# Patient Record
Sex: Male | Born: 1945
Health system: Southern US, Community
[De-identification: ages and names within clinical notes are randomized; demographics above are authoritative.]

## PROBLEM LIST (undated history)

## (undated) DIAGNOSIS — F329 Major depressive disorder, single episode, unspecified: Secondary | ICD-10-CM

## (undated) DIAGNOSIS — J189 Pneumonia, unspecified organism: Secondary | ICD-10-CM

## (undated) DIAGNOSIS — C159 Malignant neoplasm of esophagus, unspecified: Secondary | ICD-10-CM

## (undated) DIAGNOSIS — D126 Benign neoplasm of colon, unspecified: Secondary | ICD-10-CM

## (undated) DIAGNOSIS — N400 Enlarged prostate without lower urinary tract symptoms: Secondary | ICD-10-CM

## (undated) DIAGNOSIS — C169 Malignant neoplasm of stomach, unspecified: Secondary | ICD-10-CM

## (undated) DIAGNOSIS — Z9289 Personal history of other medical treatment: Secondary | ICD-10-CM

## (undated) DIAGNOSIS — I10 Essential (primary) hypertension: Secondary | ICD-10-CM

## (undated) DIAGNOSIS — K5792 Diverticulitis of intestine, part unspecified, without perforation or abscess without bleeding: Secondary | ICD-10-CM

## (undated) DIAGNOSIS — K2270 Barrett's esophagus without dysplasia: Secondary | ICD-10-CM

## (undated) DIAGNOSIS — J984 Other disorders of lung: Secondary | ICD-10-CM

## (undated) DIAGNOSIS — K219 Gastro-esophageal reflux disease without esophagitis: Secondary | ICD-10-CM

## (undated) DIAGNOSIS — E7800 Pure hypercholesterolemia, unspecified: Secondary | ICD-10-CM

## (undated) DIAGNOSIS — M1990 Unspecified osteoarthritis, unspecified site: Secondary | ICD-10-CM

## (undated) DIAGNOSIS — E119 Type 2 diabetes mellitus without complications: Secondary | ICD-10-CM

## (undated) DIAGNOSIS — F419 Anxiety disorder, unspecified: Secondary | ICD-10-CM

## (undated) DIAGNOSIS — F17200 Nicotine dependence, unspecified, uncomplicated: Secondary | ICD-10-CM

## (undated) DIAGNOSIS — F32A Depression, unspecified: Secondary | ICD-10-CM

## (undated) DIAGNOSIS — K449 Diaphragmatic hernia without obstruction or gangrene: Secondary | ICD-10-CM

## (undated) DIAGNOSIS — K922 Gastrointestinal hemorrhage, unspecified: Secondary | ICD-10-CM

## (undated) DIAGNOSIS — J449 Chronic obstructive pulmonary disease, unspecified: Secondary | ICD-10-CM

## (undated) DIAGNOSIS — D649 Anemia, unspecified: Secondary | ICD-10-CM

## (undated) DIAGNOSIS — N529 Male erectile dysfunction, unspecified: Secondary | ICD-10-CM

## (undated) DIAGNOSIS — J438 Other emphysema: Secondary | ICD-10-CM

## (undated) HISTORY — DX: Benign prostatic hyperplasia without lower urinary tract symptoms: N40.0

## (undated) HISTORY — PX: COLONOSCOPY: SHX174

## (undated) HISTORY — PX: VASECTOMY: SHX75

## (undated) HISTORY — DX: Nicotine dependence, unspecified, uncomplicated: F17.200

## (undated) HISTORY — PX: UPPER GASTROINTESTINAL ENDOSCOPY: SHX188

## (undated) HISTORY — DX: Pure hypercholesterolemia, unspecified: E78.00

## (undated) HISTORY — DX: Diverticulitis of intestine, part unspecified, without perforation or abscess without bleeding: K57.92

## (undated) HISTORY — DX: Chronic obstructive pulmonary disease, unspecified: J44.9

## (undated) HISTORY — PX: POLYPECTOMY: SHX149

## (undated) HISTORY — DX: Male erectile dysfunction, unspecified: N52.9

## (undated) HISTORY — PX: CIRCUMCISION: SUR203

## (undated) HISTORY — DX: Gastro-esophageal reflux disease without esophagitis: K21.9

## (undated) HISTORY — DX: Benign neoplasm of colon, unspecified: D12.6

## (undated) HISTORY — DX: Other disorders of lung: J98.4

## (undated) HISTORY — DX: Barrett's esophagus without dysplasia: K22.70

## (undated) HISTORY — DX: Essential (primary) hypertension: I10

## (undated) HISTORY — DX: Unspecified osteoarthritis, unspecified site: M19.90

## (undated) HISTORY — DX: Other emphysema: J43.8

## (undated) HISTORY — DX: Diaphragmatic hernia without obstruction or gangrene: K44.9

---

## 1978-08-19 HISTORY — PX: OTHER SURGICAL HISTORY: SHX169

## 1978-08-19 HISTORY — PX: FOOT FRACTURE SURGERY: SHX645

## 1999-12-05 ENCOUNTER — Encounter: Admission: RE | Admit: 1999-12-05 | Discharge: 2000-03-04 | Payer: Self-pay | Admitting: Endocrinology

## 2000-10-24 ENCOUNTER — Encounter (INDEPENDENT_AMBULATORY_CARE_PROVIDER_SITE_OTHER): Payer: Self-pay

## 2000-10-24 ENCOUNTER — Other Ambulatory Visit: Admission: RE | Admit: 2000-10-24 | Discharge: 2000-10-24 | Payer: Self-pay | Admitting: Gastroenterology

## 2001-01-19 ENCOUNTER — Encounter (INDEPENDENT_AMBULATORY_CARE_PROVIDER_SITE_OTHER): Payer: Self-pay

## 2001-01-19 ENCOUNTER — Other Ambulatory Visit: Admission: RE | Admit: 2001-01-19 | Discharge: 2001-01-19 | Payer: Self-pay | Admitting: Gastroenterology

## 2001-07-15 ENCOUNTER — Encounter: Admission: RE | Admit: 2001-07-15 | Discharge: 2001-10-13 | Payer: Self-pay | Admitting: Endocrinology

## 2004-06-22 ENCOUNTER — Ambulatory Visit: Payer: Self-pay | Admitting: Endocrinology

## 2004-06-27 ENCOUNTER — Ambulatory Visit: Payer: Self-pay | Admitting: Endocrinology

## 2004-07-02 ENCOUNTER — Ambulatory Visit: Payer: Self-pay | Admitting: Gastroenterology

## 2004-07-16 ENCOUNTER — Ambulatory Visit: Payer: Self-pay | Admitting: Gastroenterology

## 2004-12-17 ENCOUNTER — Ambulatory Visit: Payer: Self-pay | Admitting: Endocrinology

## 2004-12-25 ENCOUNTER — Ambulatory Visit: Payer: Self-pay | Admitting: Endocrinology

## 2005-03-29 ENCOUNTER — Ambulatory Visit: Payer: Self-pay | Admitting: Endocrinology

## 2005-04-08 ENCOUNTER — Ambulatory Visit: Payer: Self-pay | Admitting: Endocrinology

## 2005-05-10 ENCOUNTER — Ambulatory Visit: Payer: Self-pay | Admitting: Endocrinology

## 2005-06-21 ENCOUNTER — Ambulatory Visit: Payer: Self-pay | Admitting: Endocrinology

## 2005-07-05 ENCOUNTER — Ambulatory Visit: Payer: Self-pay | Admitting: Endocrinology

## 2005-08-05 ENCOUNTER — Ambulatory Visit: Payer: Self-pay | Admitting: Gastroenterology

## 2005-08-19 DIAGNOSIS — K2270 Barrett's esophagus without dysplasia: Secondary | ICD-10-CM

## 2005-08-19 HISTORY — DX: Barrett's esophagus without dysplasia: K22.70

## 2005-11-04 ENCOUNTER — Ambulatory Visit: Payer: Self-pay | Admitting: Gastroenterology

## 2005-11-19 ENCOUNTER — Encounter (INDEPENDENT_AMBULATORY_CARE_PROVIDER_SITE_OTHER): Payer: Self-pay | Admitting: Specialist

## 2005-11-19 ENCOUNTER — Ambulatory Visit: Payer: Self-pay | Admitting: Gastroenterology

## 2005-11-19 HISTORY — PX: OTHER SURGICAL HISTORY: SHX169

## 2005-11-19 LAB — HM COLONOSCOPY

## 2005-12-05 ENCOUNTER — Ambulatory Visit (HOSPITAL_COMMUNITY): Admission: RE | Admit: 2005-12-05 | Discharge: 2005-12-05 | Payer: Self-pay | Admitting: Gastroenterology

## 2005-12-25 ENCOUNTER — Ambulatory Visit: Payer: Self-pay | Admitting: Endocrinology

## 2006-01-01 ENCOUNTER — Ambulatory Visit: Payer: Self-pay | Admitting: Endocrinology

## 2006-03-26 ENCOUNTER — Ambulatory Visit: Payer: Self-pay | Admitting: Endocrinology

## 2006-04-03 ENCOUNTER — Ambulatory Visit: Payer: Self-pay | Admitting: Endocrinology

## 2006-06-30 ENCOUNTER — Ambulatory Visit: Payer: Self-pay | Admitting: Endocrinology

## 2006-06-30 LAB — CONVERTED CEMR LAB
ALT: 17 units/L (ref 0–40)
AST: 20 units/L (ref 0–37)
Albumin: 3.8 g/dL (ref 3.5–5.2)
BUN: 21 mg/dL (ref 6–23)
Basophils Absolute: 0.1 10*3/uL (ref 0.0–0.1)
Basophils Relative: 0.8 % (ref 0.0–1.0)
Bilirubin Urine: NEGATIVE
CO2: 26 meq/L (ref 19–32)
Cholesterol: 79 mg/dL (ref 0–200)
Creatinine, Ser: 1.2 mg/dL (ref 0.4–1.5)
Creatinine,U: 108.2 mg/dL
Eosinophil percent: 3.9 % (ref 0.0–5.0)
Glomerular Filtration Rate, Af Am: 80 mL/min/{1.73_m2}
HCT: 40.6 % (ref 39.0–52.0)
Hemoglobin: 13.4 g/dL (ref 13.0–17.0)
Leukocytes, UA: NEGATIVE
Lymphocytes Relative: 32.4 % (ref 12.0–46.0)
Monocytes Absolute: 0.4 10*3/uL (ref 0.2–0.7)
Neutrophils Relative %: 56.9 % (ref 43.0–77.0)
Platelets: 186 10*3/uL (ref 150–400)
Potassium: 4.2 meq/L (ref 3.5–5.1)
RBC: 4.22 M/uL (ref 4.22–5.81)
Total Bilirubin: 0.6 mg/dL (ref 0.3–1.2)
Total Protein: 6.8 g/dL (ref 6.0–8.3)
Urobilinogen, UA: 0.2 (ref 0.0–1.0)
WBC: 6.6 10*3/uL (ref 4.5–10.5)
pH: 6.5 (ref 5.0–8.0)

## 2006-07-22 ENCOUNTER — Ambulatory Visit: Payer: Self-pay | Admitting: Endocrinology

## 2006-08-25 ENCOUNTER — Ambulatory Visit: Payer: Self-pay

## 2006-10-14 ENCOUNTER — Ambulatory Visit: Payer: Self-pay | Admitting: Pulmonary Disease

## 2006-10-14 ENCOUNTER — Ambulatory Visit: Payer: Self-pay | Admitting: Internal Medicine

## 2006-10-14 ENCOUNTER — Inpatient Hospital Stay (HOSPITAL_COMMUNITY): Admission: EM | Admit: 2006-10-14 | Discharge: 2006-10-18 | Payer: Self-pay | Admitting: Emergency Medicine

## 2006-10-15 ENCOUNTER — Encounter: Payer: Self-pay | Admitting: Cardiology

## 2006-10-20 ENCOUNTER — Ambulatory Visit: Payer: Self-pay | Admitting: Endocrinology

## 2006-10-21 ENCOUNTER — Ambulatory Visit: Payer: Self-pay | Admitting: Endocrinology

## 2006-10-27 ENCOUNTER — Ambulatory Visit: Payer: Self-pay | Admitting: Endocrinology

## 2006-11-13 ENCOUNTER — Ambulatory Visit: Payer: Self-pay | Admitting: Cardiology

## 2006-12-08 ENCOUNTER — Ambulatory Visit: Payer: Self-pay | Admitting: Cardiology

## 2006-12-08 LAB — CONVERTED CEMR LAB
Basophils Relative: 0.3 % (ref 0.0–1.0)
CO2: 28 meq/L (ref 19–32)
Chloride: 106 meq/L (ref 96–112)
Glucose, Bld: 166 mg/dL — ABNORMAL HIGH (ref 70–99)
INR: 0.8 — ABNORMAL LOW (ref 0.9–2.0)
MCHC: 34.3 g/dL (ref 30.0–36.0)
Monocytes Relative: 5.8 % (ref 3.0–11.0)
Neutrophils Relative %: 73.5 % (ref 43.0–77.0)
RBC: 3.8 M/uL — ABNORMAL LOW (ref 4.22–5.81)
RDW: 13 % (ref 11.5–14.6)
Sodium: 141 meq/L (ref 135–145)
WBC: 10.9 10*3/uL — ABNORMAL HIGH (ref 4.5–10.5)

## 2007-01-06 ENCOUNTER — Ambulatory Visit: Payer: Self-pay | Admitting: Cardiology

## 2007-01-06 LAB — CONVERTED CEMR LAB
ALT: 18 units/L (ref 0–40)
AST: 23 units/L (ref 0–37)
Bilirubin, Direct: 0.1 mg/dL (ref 0.0–0.3)
HDL: 31.5 mg/dL — ABNORMAL LOW (ref >39.0)
Total Bilirubin: 0.6 mg/dL (ref 0.3–1.2)
Total CHOL/HDL Ratio: 2.5
Triglycerides: 145 mg/dL (ref 0–149)
VLDL: 29 mg/dL (ref 0–40)

## 2007-02-27 ENCOUNTER — Ambulatory Visit: Payer: Self-pay | Admitting: Endocrinology

## 2007-02-27 LAB — CONVERTED CEMR LAB: Hgb A1c MFr Bld: 7.4 % — ABNORMAL HIGH (ref 4.6–6.0)

## 2007-03-05 ENCOUNTER — Ambulatory Visit: Payer: Self-pay | Admitting: Endocrinology

## 2007-03-13 ENCOUNTER — Encounter: Payer: Self-pay | Admitting: Endocrinology

## 2007-03-13 DIAGNOSIS — F418 Other specified anxiety disorders: Secondary | ICD-10-CM

## 2007-03-13 DIAGNOSIS — I10 Essential (primary) hypertension: Secondary | ICD-10-CM

## 2007-03-13 DIAGNOSIS — K219 Gastro-esophageal reflux disease without esophagitis: Secondary | ICD-10-CM | POA: Insufficient documentation

## 2007-03-13 DIAGNOSIS — E119 Type 2 diabetes mellitus without complications: Secondary | ICD-10-CM

## 2007-03-13 HISTORY — DX: Gastro-esophageal reflux disease without esophagitis: K21.9

## 2007-03-13 HISTORY — DX: Type 2 diabetes mellitus without complications: E11.9

## 2007-03-13 HISTORY — DX: Essential (primary) hypertension: I10

## 2007-08-03 ENCOUNTER — Ambulatory Visit: Payer: Self-pay | Admitting: Endocrinology

## 2007-08-05 LAB — CONVERTED CEMR LAB
AST: 29 units/L (ref 0–37)
Cholesterol: 53 mg/dL (ref 0–200)
Creatinine, Ser: 1.1 mg/dL (ref 0.4–1.5)
Eosinophils Absolute: 0.2 10*3/uL (ref 0.0–0.6)
Eosinophils Relative: 3 % (ref 0.0–5.0)
GFR calc Af Amer: 88 mL/min
GFR calc non Af Amer: 73 mL/min
Glucose, Bld: 139 mg/dL — ABNORMAL HIGH (ref 70–99)
Hemoglobin, Urine: NEGATIVE
Hemoglobin: 13.6 g/dL (ref 13.0–17.0)
Hgb A1c MFr Bld: 7 % — ABNORMAL HIGH (ref 4.6–6.0)
Ketones, ur: NEGATIVE mg/dL
LDL Cholesterol: 21 mg/dL (ref 0–99)
Lymphocytes Relative: 26.8 % (ref 12.0–46.0)
MCV: 95.3 fL (ref 78.0–100.0)
Neutrophils Relative %: 64 % (ref 43.0–77.0)
Nitrite: NEGATIVE
Platelets: 153 10*3/uL (ref 150–400)
Potassium: 4.7 meq/L (ref 3.5–5.1)
Specific Gravity, Urine: 1.015 (ref 1.000–1.03)
TSH: 1.48 microintl units/mL (ref 0.35–5.50)
Total Protein: 6.4 g/dL (ref 6.0–8.3)
Triglycerides: 84 mg/dL (ref 0–149)
Urine Glucose: NEGATIVE mg/dL
Urobilinogen, UA: 0.2 (ref 0.0–1.0)
VLDL: 17 mg/dL (ref 0–40)

## 2007-08-07 ENCOUNTER — Ambulatory Visit: Payer: Self-pay | Admitting: Endocrinology

## 2007-09-28 ENCOUNTER — Telehealth (INDEPENDENT_AMBULATORY_CARE_PROVIDER_SITE_OTHER): Payer: Self-pay | Admitting: *Deleted

## 2008-01-27 ENCOUNTER — Ambulatory Visit: Payer: Self-pay | Admitting: Endocrinology

## 2008-01-27 LAB — CONVERTED CEMR LAB
Cholesterol: 92 mg/dL (ref 0–200)
HDL: 26.7 mg/dL — ABNORMAL LOW (ref >39.0)

## 2008-02-03 ENCOUNTER — Ambulatory Visit: Payer: Self-pay | Admitting: Endocrinology

## 2008-02-03 DIAGNOSIS — E7800 Pure hypercholesterolemia, unspecified: Secondary | ICD-10-CM

## 2008-02-03 HISTORY — DX: Pure hypercholesterolemia, unspecified: E78.00

## 2008-03-17 ENCOUNTER — Telehealth: Payer: Self-pay | Admitting: Endocrinology

## 2008-03-21 ENCOUNTER — Telehealth (INDEPENDENT_AMBULATORY_CARE_PROVIDER_SITE_OTHER): Payer: Self-pay | Admitting: *Deleted

## 2008-04-28 ENCOUNTER — Telehealth (INDEPENDENT_AMBULATORY_CARE_PROVIDER_SITE_OTHER): Payer: Self-pay | Admitting: *Deleted

## 2008-05-19 ENCOUNTER — Ambulatory Visit: Payer: Self-pay | Admitting: Endocrinology

## 2008-05-19 DIAGNOSIS — G622 Polyneuropathy due to other toxic agents: Secondary | ICD-10-CM

## 2008-05-19 DIAGNOSIS — G619 Inflammatory polyneuropathy, unspecified: Secondary | ICD-10-CM | POA: Insufficient documentation

## 2008-05-19 DIAGNOSIS — R071 Chest pain on breathing: Secondary | ICD-10-CM

## 2008-05-19 LAB — CONVERTED CEMR LAB: Hgb A1c MFr Bld: 6.8 % — ABNORMAL HIGH (ref 4.6–6.0)

## 2008-07-25 ENCOUNTER — Ambulatory Visit: Payer: Self-pay | Admitting: Endocrinology

## 2008-07-27 LAB — CONVERTED CEMR LAB
ALT: 21 units/L (ref 0–53)
Albumin: 3.9 g/dL (ref 3.5–5.2)
Alkaline Phosphatase: 61 units/L (ref 39–117)
BUN: 20 mg/dL (ref 6–23)
Bilirubin Urine: NEGATIVE
Calcium: 9.2 mg/dL (ref 8.4–10.5)
Eosinophils Absolute: 0.2 10*3/uL (ref 0.0–0.7)
Eosinophils Relative: 3.4 % (ref 0.0–5.0)
GFR calc Af Amer: 88 mL/min
GFR calc non Af Amer: 73 mL/min
Glucose, Bld: 201 mg/dL — ABNORMAL HIGH (ref 70–99)
HCT: 39.9 % (ref 39.0–52.0)
Hemoglobin, Urine: NEGATIVE
Hemoglobin: 13.5 g/dL (ref 13.0–17.0)
LDL Cholesterol: 50 mg/dL (ref 0–99)
Lymphocytes Relative: 29 % (ref 12.0–46.0)
MCHC: 33.9 g/dL (ref 30.0–36.0)
MCV: 97.8 fL (ref 78.0–100.0)
Monocytes Absolute: 0.4 10*3/uL (ref 0.1–1.0)
Monocytes Relative: 6.1 % (ref 3.0–12.0)
Neutrophils Relative %: 60.4 % (ref 43.0–77.0)
PSA: 0.65 ng/mL (ref 0.10–4.00)
Potassium: 4.7 meq/L (ref 3.5–5.1)
RDW: 11.7 % (ref 11.5–14.6)
Sodium: 137 meq/L (ref 135–145)
TSH: 2.07 microintl units/mL (ref 0.35–5.50)
Total Protein, Urine: NEGATIVE mg/dL
Total Protein: 6.6 g/dL (ref 6.0–8.3)
Urobilinogen, UA: 0.2 (ref 0.0–1.0)
VLDL: 25 mg/dL (ref 0–40)
pH: 6.5 (ref 5.0–8.0)

## 2008-08-01 ENCOUNTER — Ambulatory Visit: Payer: Self-pay | Admitting: Endocrinology

## 2008-08-01 DIAGNOSIS — D126 Benign neoplasm of colon, unspecified: Secondary | ICD-10-CM | POA: Insufficient documentation

## 2008-09-28 ENCOUNTER — Encounter: Payer: Self-pay | Admitting: Endocrinology

## 2008-10-24 ENCOUNTER — Ambulatory Visit: Payer: Self-pay | Admitting: Endocrinology

## 2008-10-31 ENCOUNTER — Inpatient Hospital Stay (HOSPITAL_COMMUNITY): Admission: EM | Admit: 2008-10-31 | Discharge: 2008-11-07 | Payer: Self-pay | Admitting: Emergency Medicine

## 2008-10-31 ENCOUNTER — Ambulatory Visit: Payer: Self-pay | Admitting: Internal Medicine

## 2008-10-31 ENCOUNTER — Encounter: Payer: Self-pay | Admitting: Endocrinology

## 2008-11-05 ENCOUNTER — Encounter: Payer: Self-pay | Admitting: Pulmonary Disease

## 2008-11-08 ENCOUNTER — Ambulatory Visit: Payer: Self-pay | Admitting: Endocrinology

## 2008-11-08 DIAGNOSIS — J438 Other emphysema: Secondary | ICD-10-CM

## 2008-11-08 DIAGNOSIS — J189 Pneumonia, unspecified organism: Secondary | ICD-10-CM

## 2008-11-08 HISTORY — DX: Other emphysema: J43.8

## 2008-11-24 ENCOUNTER — Ambulatory Visit: Payer: Self-pay | Admitting: Pulmonary Disease

## 2008-11-24 DIAGNOSIS — J984 Other disorders of lung: Secondary | ICD-10-CM

## 2008-11-24 DIAGNOSIS — F17200 Nicotine dependence, unspecified, uncomplicated: Secondary | ICD-10-CM

## 2008-11-24 HISTORY — DX: Nicotine dependence, unspecified, uncomplicated: F17.200

## 2008-11-24 HISTORY — DX: Other disorders of lung: J98.4

## 2008-12-02 ENCOUNTER — Encounter: Payer: Self-pay | Admitting: Pulmonary Disease

## 2008-12-15 ENCOUNTER — Ambulatory Visit: Payer: Self-pay | Admitting: Endocrinology

## 2008-12-15 DIAGNOSIS — F411 Generalized anxiety disorder: Secondary | ICD-10-CM

## 2009-03-21 ENCOUNTER — Ambulatory Visit: Payer: Self-pay | Admitting: Endocrinology

## 2009-03-23 ENCOUNTER — Ambulatory Visit: Payer: Self-pay | Admitting: Endocrinology

## 2009-03-30 ENCOUNTER — Telehealth (INDEPENDENT_AMBULATORY_CARE_PROVIDER_SITE_OTHER): Payer: Self-pay | Admitting: *Deleted

## 2009-04-05 ENCOUNTER — Telehealth: Payer: Self-pay | Admitting: Endocrinology

## 2009-04-07 ENCOUNTER — Ambulatory Visit: Payer: Self-pay | Admitting: Cardiology

## 2009-04-07 ENCOUNTER — Ambulatory Visit: Payer: Self-pay | Admitting: Pulmonary Disease

## 2009-07-28 ENCOUNTER — Ambulatory Visit: Payer: Self-pay | Admitting: Endocrinology

## 2009-07-28 LAB — CONVERTED CEMR LAB
ALT: 21 units/L (ref 0–53)
Basophils Absolute: 0.1 10*3/uL (ref 0.0–0.1)
Basophils Relative: 1.1 % (ref 0.0–3.0)
Bilirubin Urine: NEGATIVE
Bilirubin, Direct: 0.1 mg/dL (ref 0.0–0.3)
Cholesterol: 119 mg/dL (ref 0–200)
Creatinine,U: 106 mg/dL
Eosinophils Absolute: 0.2 10*3/uL (ref 0.0–0.7)
Eosinophils Relative: 3.5 % (ref 0.0–5.0)
GFR calc non Af Amer: 65.36 mL/min (ref >60)
Glucose, Bld: 170 mg/dL — ABNORMAL HIGH (ref 70–99)
HCT: 35.9 % — ABNORMAL LOW (ref 39.0–52.0)
Hemoglobin, Urine: NEGATIVE
Ketones, ur: NEGATIVE mg/dL
Lymphocytes Relative: 28.1 % (ref 12.0–46.0)
Lymphs Abs: 2 10*3/uL (ref 0.7–4.0)
MCHC: 34.2 g/dL (ref 30.0–36.0)
Microalb, Ur: 6.1 mg/dL — ABNORMAL HIGH (ref 0.0–1.9)
Monocytes Relative: 6.5 % (ref 3.0–12.0)
Neutro Abs: 4.3 10*3/uL (ref 1.4–7.7)
Neutrophils Relative %: 60.8 % (ref 43.0–77.0)
PSA: 0.59 ng/mL (ref 0.10–4.00)
Potassium: 4.4 meq/L (ref 3.5–5.1)
RBC: 3.69 M/uL — ABNORMAL LOW (ref 4.22–5.81)
Total Protein, Urine: NEGATIVE mg/dL
Triglycerides: 157 mg/dL — ABNORMAL HIGH (ref 0.0–149.0)
pH: 5.5 (ref 5.0–8.0)

## 2009-08-15 ENCOUNTER — Ambulatory Visit: Payer: Self-pay | Admitting: Endocrinology

## 2009-08-15 DIAGNOSIS — R809 Proteinuria, unspecified: Secondary | ICD-10-CM | POA: Insufficient documentation

## 2009-08-15 DIAGNOSIS — D649 Anemia, unspecified: Secondary | ICD-10-CM

## 2009-08-15 HISTORY — DX: Anemia, unspecified: D64.9

## 2009-08-16 LAB — CONVERTED CEMR LAB
HCT: 39.1 % (ref 39.0–52.0)
Hemoglobin: 13.2 g/dL (ref 13.0–17.0)
Iron: 59 ug/dL (ref 42–165)
MCHC: 33.6 g/dL (ref 30.0–36.0)
RDW: 11.8 % (ref 11.5–14.6)
Saturation Ratios: 17.1 % — ABNORMAL LOW (ref 20.0–50.0)
Transferrin: 246.1 mg/dL (ref 212.0–360.0)
Vitamin B-12: 1186 pg/mL — ABNORMAL HIGH (ref 211–911)

## 2009-10-19 ENCOUNTER — Telehealth: Payer: Self-pay | Admitting: Endocrinology

## 2009-11-16 ENCOUNTER — Ambulatory Visit: Payer: Self-pay | Admitting: Endocrinology

## 2009-11-16 LAB — CONVERTED CEMR LAB: Hgb A1c MFr Bld: 7.6 % — ABNORMAL HIGH (ref 4.6–6.5)

## 2010-02-20 ENCOUNTER — Ambulatory Visit: Payer: Self-pay | Admitting: Endocrinology

## 2010-03-13 ENCOUNTER — Telehealth: Payer: Self-pay | Admitting: Internal Medicine

## 2010-03-15 ENCOUNTER — Ambulatory Visit: Payer: Self-pay | Admitting: Endocrinology

## 2010-03-15 LAB — CONVERTED CEMR LAB
Cholesterol: 136 mg/dL (ref 0–200)
Direct LDL: 66.5 mg/dL
HDL: 30 mg/dL — ABNORMAL LOW (ref >39.00)
Hgb A1c MFr Bld: 8.9 % — ABNORMAL HIGH (ref 4.6–6.5)
Total CHOL/HDL Ratio: 5
Total Protein: 6.4 g/dL (ref 6.0–8.3)
Triglycerides: 255 mg/dL — ABNORMAL HIGH (ref 0.0–149.0)
VLDL: 51 mg/dL — ABNORMAL HIGH (ref 0.0–40.0)

## 2010-03-19 ENCOUNTER — Ambulatory Visit: Payer: Self-pay | Admitting: Endocrinology

## 2010-04-10 ENCOUNTER — Telehealth: Payer: Self-pay | Admitting: Endocrinology

## 2010-06-06 ENCOUNTER — Telehealth: Payer: Self-pay | Admitting: Endocrinology

## 2010-06-07 ENCOUNTER — Telehealth: Payer: Self-pay | Admitting: Endocrinology

## 2010-06-11 ENCOUNTER — Telehealth: Payer: Self-pay | Admitting: Endocrinology

## 2010-06-18 ENCOUNTER — Ambulatory Visit: Payer: Self-pay | Admitting: Pulmonary Disease

## 2010-06-18 ENCOUNTER — Ambulatory Visit: Payer: Self-pay | Admitting: Endocrinology

## 2010-07-03 ENCOUNTER — Telehealth: Payer: Self-pay | Admitting: Endocrinology

## 2010-07-16 ENCOUNTER — Ambulatory Visit: Payer: Self-pay | Admitting: Cardiology

## 2010-07-24 ENCOUNTER — Ambulatory Visit: Payer: Self-pay | Admitting: Endocrinology

## 2010-07-24 LAB — CONVERTED CEMR LAB
ALT: 28 units/L (ref 0–53)
Basophils Relative: 0.8 % (ref 0.0–3.0)
Bilirubin Urine: NEGATIVE
CO2: 30 meq/L (ref 19–32)
Calcium: 9.3 mg/dL (ref 8.4–10.5)
Cholesterol: 130 mg/dL (ref 0–200)
Creatinine, Ser: 1.1 mg/dL (ref 0.4–1.5)
Glucose, Bld: 239 mg/dL — ABNORMAL HIGH (ref 70–99)
HDL: 31.2 mg/dL — ABNORMAL LOW (ref >39.00)
Ketones, ur: NEGATIVE mg/dL
Lymphocytes Relative: 32.9 % (ref 12.0–46.0)
MCV: 94.9 fL (ref 78.0–100.0)
Neutrophils Relative %: 56.5 % (ref 43.0–77.0)
Nitrite: NEGATIVE
Potassium: 4.8 meq/L (ref 3.5–5.1)
TSH: 2.21 microintl units/mL (ref 0.35–5.50)
Total Bilirubin: 0.5 mg/dL (ref 0.3–1.2)
Total CHOL/HDL Ratio: 4
Triglycerides: 216 mg/dL — ABNORMAL HIGH (ref 0.0–149.0)
Urobilinogen, UA: 0.2 (ref 0.0–1.0)
VLDL: 43.2 mg/dL — ABNORMAL HIGH (ref 0.0–40.0)
WBC: 5.5 10*3/uL (ref 4.5–10.5)

## 2010-07-30 ENCOUNTER — Ambulatory Visit: Payer: Self-pay | Admitting: Endocrinology

## 2010-07-30 ENCOUNTER — Encounter: Payer: Self-pay | Admitting: Endocrinology

## 2010-07-30 LAB — CONVERTED CEMR LAB
Iron: 128 ug/dL (ref 42–165)
Vitamin B-12: 789 pg/mL (ref 211–911)

## 2010-09-18 NOTE — Miscellaneous (Signed)
Summary: [facility]   Imported By: Lester [place] 03/22/2010 08:43:21  _____________________________________________________________________  External Attachment:    Type:   Image     Comment:   External Document

## 2010-09-18 NOTE — Miscellaneous (Signed)
Summary: [facility]   Imported By: Lester [place] 03/22/2010 08:46:08  _____________________________________________________________________  External Attachment:    Type:   Image     Comment:   External Document

## 2010-09-18 NOTE — Progress Notes (Signed)
Summary: Elevated CBG  Phone Note Call from Patient   Caller: Spouse Summary of Call: Clerance Lav called stating pts CBG this am was 210 and increased his insulin to 80 units.  Should pt increase insulin or add another med? Initial call taken by: Lanier Prude, [facility]),  June 07, 2010 3:07 PM  Follow-up for Phone Call        please continue same ov is due Follow-up by: Minus Breeding MD,  June 07, 2010 3:10 PM  Additional Follow-up for Phone Call Additional follow up Details #1::        pt's wife informed. Additional Follow-up by: Lanier Prude, [facility]),  June 07, 2010 3:16 PM

## 2010-09-18 NOTE — Progress Notes (Signed)
  Phone Note Refill Request Message from:  Fax from Pharmacy on October 19, 2009 8:33 AM  Refills Requested: Medication #1:  METOPROLOL SUCCINATE 25 MG XR24H-TAB 1 qd   Dosage confirmed as above?Dosage Confirmed Initial call taken by: Josph Macho RMA,  October 19, 2009 8:35 AM    Prescriptions: METOPROLOL SUCCINATE 25 MG XR24H-TAB (METOPROLOL SUCCINATE) 1 qd  #90 x 2   Entered by:   Josph Macho RMA   Authorized by:   Minus Breeding MD   Signed by:   Josph Macho RMA on 10/19/2009   Method used:   Electronically to        [facility] (retail)       [address]       [place], [place]  [address]       Ph: [phone]       Fax: [phone]   RxID:   [id number]
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
## 2010-09-18 NOTE — Progress Notes (Signed)
Summary: Elevated CBG  Phone Note Call from Patient Call back at Home Phone [phone]   Caller: Spouse Summary of Call: Pt's spouse called stating that pt's CBGs have still been high 450+. Pt took 40u of left over Humolog and it brought his reading down to 250 and then when he re-checked it was 210. Pt is out of town working but does have appt scheduled next Monday. Pt and spouse are requesting MD advise on using Humolog until OV for severly elevated sugars. Pt is asymptomatic per spouse. Initial call taken by: Margaret Pyle, CMA,  June 11, 2010 2:26 PM  Follow-up for Phone Call        increase levemir to 90 units two times a day  Follow-up by: Minus Breeding MD,  June 11, 2010 2:58 PM  Additional Follow-up for Phone Call Additional follow up Details #1::        Pt's spouse informed Additional Follow-up by: Margaret Pyle, CMA,  June 11, 2010 3:47 PM    New/Updated Medications: LEVEMIR 100 UNIT/ML SOLN (INSULIN DETEMIR) 90 units two times a day

## 2010-09-18 NOTE — Progress Notes (Signed)
Summary: Med Adj  Phone Note Call from Patient Call back at Home Phone [phone]   Caller: Spouse Summary of Call: Pt's spouse called stating that since pt started Levermir 70u two times a day his CBGs have been runninghigh 200+. Pt' spouse is requesting med adjustment. Pt is currenlty out of town and would not be able to start a new medicine until her returns. Initial call taken by: Margaret Pyle, CMA,  June 06, 2010 11:57 AM  Follow-up for Phone Call        increase to 80 units two times a day ret 1-2 weeks Follow-up by: Minus Breeding MD,  June 06, 2010 12:43 PM  Additional Follow-up for Phone Call Additional follow up Details #1::        Pt's spouse informed, pt has appt sch for 10/31 Additional Follow-up by: Margaret Pyle, CMA,  June 06, 2010 1:33 PM

## 2010-09-18 NOTE — Assessment & Plan Note (Signed)
Summary: 1 mos f/u #/cd   RS'D PER WIFE TO THIS DATE/NWS   Vital Signs:  Patient profile:   65 year old male Height:      76 inches (193.04 cm) Weight:      278.38 pounds (126.54 kg) BMI:     34.01 O2 Sat:      93 % on Room air Temp:     97.7 degrees F (36.50 degrees C) oral Pulse rate:   99 / minute BP sitting:   122 / 76  (left arm) Cuff size:   large  Vitals Entered By: Brenton Grills CMA Duncan Dull) (June 18, 2010 3:48 PM)  O2 Flow:  Room air CC: Follow-up on CBG's/aj Is Patient Diabetic? Yes   Primary Ananias Kolander:  ellison  CC:  Follow-up on CBG's/aj.  History of Present Illness: the status of at least 3 ongoing medical problems is addressed today: dm:  pt says that, despite levemir 90 units two times a day, cbg's range from 300-400.  pt states he feels well in general.  he has slight polyuria. depression:  pt says effexor "made me a zombie." htn:  pt has slight cough.  Current Medications (verified): 1)  Metformin Hcl 1000 Mg  Tabs (Metformin Hcl) .... Take 1 By Mouth Two Times A Day 2)  Lisinopril 20 Mg  Tabs (Lisinopril) .... Take 1 Tablet By Mouth Once A Day 3)  Hyzaar 100-25 Mg  Tabs (Losartan Potassium-Hctz) .... Take 1 Tablet By Mouth Once A Day 4)  Precision Xtra Blood Glucose   Strp (Glucose Blood) .... Check Blood Sugar Three Times A Day , and Lancets 250.01 5)  Viagra 100 Mg Tabs (Sildenafil Citrate) .... As Needed 6)  Crestor 40 Mg  Tabs (Rosuvastatin Calcium) .Marland Kitchen.. 1 Once Daily 7)  Diazepam 5 Mg Tabs (Diazepam) .Marland Kitchen.. 1 By Mouth Two Times A Day As Needed 8)  Aspir-Low 81 Mg Tbec (Aspirin) .Marland Kitchen.. 1 Once Daily After Meal 9)  Insulin Pen Needles, 29-Gauge, Half-Inch .... Qid 10)  Prilosec 20 Mg Cpdr (Omeprazole) .... Take 1 Tablet By Mouth Once A Day 11)  Chantix 1 Mg Tabs (Varenicline Tartrate) .Marland Kitchen.. 1 Tab Two Times A Day 12)  Metoprolol Succinate 25 Mg Xr24h-Tab (Metoprolol Succinate) .... Take 1 Tablet By Mouth Once A Day 13)  Amlodipine Besylate 2.5 Mg Tabs  (Amlodipine Besylate) .Marland Kitchen.. 1 Once Daily 14)  Levemir 100 Unit/ml Soln (Insulin Detemir) .... 90 Units Two Times A Day 15)  Zyrtec Allergy 10 Mg Tabs (Cetirizine Hcl) .... Take 1 Tablet By Mouth Once A Day As Needed  Allergies (verified): No Known Drug Allergies  Past History:  Past Medical History: Last updated: 03/13/2007 Diabetes mellitus, type I GERD Hypertension Smoker E.D. Dyslipiemia Depression Prostatism  Social History: Reviewed history from 11/24/2008 and no changes required. married with children.   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  Smoked x 35 years 1ppd.    Review of Systems  The patient denies hypoglycemia, weight loss, and weight gain.    Physical Exam  General:  obese.  no distress  Skin:  injection sites without lesions.     Impression & Recommendations:  Problem # 1:  DIABETES MELLITUS, TYPE I (ICD-250.01) needs increased rx  Problem # 2:  DEPRESSION (ICD-311) he did not tolerate effexor, and declines alternative  Problem # 3:  HYPERTENSION (ICD-401.9) therapy limited by cough  Medications Added to Medication List This Visit: 1)  Levemir 100 Unit/ml Soln (Insulin detemir) .Marland Kitchen.. 110  units two times a day, and syringes for use once daily.  Other Orders: Est. Patient Level IV ([address])  Patient Instructions: 1)  check your blood sugar 2  times a day.  vary the time of day when you check, between before the 3 meals, and at bedtime.  also check if you have symptoms of your blood sugar being too high or too low.  please keep a record of the readings and bring it to your next appointment here.  please call us sooner if you are having low blood sugar episodes. 2)  increase levemir to 110 units two times a day 3)  Please schedule a follow-up appointment in 1 month.  call sooner if blood sugar stays over 200.   4)  try stopping lisinopril. 5)  stop venlafaxine. 6)  Please schedule a physical appointment in 6  weeks. 7)  let me know if you wish to go to an informational meeting regarding weight-loss surgery. Prescriptions: DIAZEPAM 5 MG TABS (DIAZEPAM) 1 by mouth two times a day as needed  #180 x 0   Entered and Authorized by:   Minus Breeding MD   Signed by:   Minus Breeding MD on 06/18/2010   Method used:   Print then Give to Patient   RxID:   [id number] AMLODIPINE BESYLATE 2.5 MG TABS (AMLODIPINE BESYLATE) 1 once daily  #90 x 3   Entered and Authorized by:   Minus Breeding MD   Signed by:   Minus Breeding MD on 06/18/2010   Method used:   Faxed to ...       [facility] (mail-order)             , [place]         Ph: [phone]       Fax: [phone]   RxID:   [id number] METOPROLOL SUCCINATE 25 MG XR24H-TAB (METOPROLOL SUCCINATE) Take 1 tablet by mouth once a day  #90 x 3   Entered and Authorized by:   Minus Breeding MD   Signed by:   Minus Breeding MD on 06/18/2010   Method used:   Faxed to ...       [facility] ([facility])             , [place]         Ph: [phone]       Fax: [phone]   RxID:   [id number] CRESTOR 40 MG  TABS (ROSUVASTATIN CALCIUM) 1 once daily  #90 x 3   Entered and Authorized by:   Minus Breeding MD   Signed by:   Minus Breeding MD on 06/18/2010   Method used:   Faxed to ...       [address] [facility] (mail-order)             , [place]         Ph: [phone]       Fax: [phone]   RxID:   [id number] VIAGRA 100 MG TABS (SILDENAFIL CITRATE) as needed  #36 x 3   Entered and Authorized by:   Minus Breeding MD   Signed by:   Minus Breeding MD on 06/18/2010   Method used:   Faxed to ...       [facility] ([facility])             , [place]         Ph: [phone]       Fax: [phone]  RxID:   [id number] PRECISION XTRA BLOOD GLUCOSE   STRP (GLUCOSE BLOOD) Check Blood Sugar three times a day , and lancets 250.01  #270 x 3   Entered and Authorized by:   Minus Breeding MD   Signed by:   Minus Breeding MD on 06/18/2010   Method used:   Faxed to ...       [facility])             , [place]         Ph: [phone]       Fax: [phone]   RxID:   [id number] HYZAAR 100-25 MG  TABS (LOSARTAN POTASSIUM-HCTZ) Take 1 tablet by mouth once a day  #90 x 3   Entered and Authorized by:   Minus Breeding MD   Signed by:   Minus Breeding MD on 06/18/2010   Method used:   Faxed to ...       [facility] (mail-order)             , [place]         Ph: [phone]       Fax: [phone]   RxID:   [id number] METFORMIN HCL 1000 MG  TABS (METFORMIN HCL) take 1 by mouth two times a day  #180 x 3   Entered and Authorized by:   Minus Breeding MD   Signed by:   Minus Breeding MD on 06/18/2010   Method used:   Faxed to ...       [facility] (mail-order)             , [place]         Ph: [phone]       Fax: [phone]   RxID:   [id number] LEVEMIR 100 UNIT/ML SOLN (INSULIN DETEMIR) 110 units two times a day, and syringes for use once daily.  #22 vials x 3   Entered and Authorized by:   Minus Breeding MD   Signed by:   Minus Breeding MD on 06/18/2010   Method used:   Faxed to ...       [facility] (mail-order)             , [place]         Ph: [phone]       Fax: [phone]   RxID:   [id number] LEVEMIR 100 UNIT/ML SOLN (INSULIN DETEMIR) 110 units two times a day, and syringes for use once daily.  #8 vials x 11   Entered and Authorized by:   Minus Breeding MD   Signed by:   Minus Breeding MD on 06/18/2010   Method used:   Electronically to        [facility] [phone]* (retail)       [address]       [place], [place]  [address]       Ph: [phone] or [phone]       Fax: [phone]   RxID:   [id number]    Orders Added: 1)  Est. Patient Level IV [68341]

## 2010-09-18 NOTE — Assessment & Plan Note (Signed)
Summary: FU Ronald Johnson   #   Vital Signs:  Patient profile:   65 year old male Height:      76 inches (193.04 cm) Weight:      275 pounds (125.00 kg) BMI:     33.60 O2 Sat:      93 % on Room air Temp:     97.6 degrees F (36.44 degrees C) oral Pulse rate:   90 / minute BP sitting:   138 / 74  (left arm) Cuff size:   large  Vitals Entered By: Brenton Grills MA (March 19, 2010 4:06 PM)  O2 Flow:  Room air CC: F/U appt/Pt is no longer taking Venlafaxine/aj Is Patient Diabetic? Yes   Primary Provider:  Yarimar Lavis  CC:  F/U appt/Pt is no longer taking Venlafaxine/aj.  History of Present Illness: the status of at least 3 ongoing medical problems is addressed today: dm:  pt states he feels well in general.  he says his diet is made difficult by frequent travel.   no cbg record, but states cbg was low once in the middle of the night.  it is highest in am.   depression:  he says effexor caused drowsiness.  depression is improved.   htn:  he takes meds as rx'ed.  no headache  Current Medications (verified): 1)  Metformin Hcl 1000 Mg  Tabs (Metformin Hcl) .... Take 1 By Mouth Two Times A Day 2)  Lisinopril 20 Mg  Tabs (Lisinopril) .... Take 1 By Mouth Qd 3)  Hyzaar 100-25 Mg  Tabs (Losartan Potassium-Hctz) .... Take 1 By Mouth Qd 4)  Humalog Pen 100 Unit/ml  Soln (Insulin Lispro (Human)) .... Tid (Qac), 20-20-15 Units. 5)  Precision Xtra Blood Glucose   Strp (Glucose Blood) .... Check Blood Sugar Three Times A Day , and Lancets 250.01 6)  Viagra 100 Mg Tabs (Sildenafil Citrate) .... As Needed 7)  Crestor 40 Mg  Tabs (Rosuvastatin Calcium) .Marland Kitchen.. 1 Once Daily 8)  Diazepam 5 Mg Tabs (Diazepam) .Marland Kitchen.. 1 Poi Two Times A Day As Needed 9)  Aspir-Low 81 Mg Tbec (Aspirin) .Marland Kitchen.. 1 Once Daily After Meal 10)  Humulin N Pen 100 Unit/ml  Susp (Insulin Isophane Human) .Marland Kitchen.. 100 Units Every Night. 11)  Insulin Pen Needles, 29-Gauge, Half-Inch .... Qid 12)  Prilosec 20 Mg Cpdr (Omeprazole) .... Take 1 Tablet By  Mouth Once A Day 13)  Chantix 1 Mg Tabs (Varenicline Tartrate) .Marland Kitchen.. 1 Tab Two Times A Day 14)  Metoprolol Succinate 25 Mg Xr24h-Tab (Metoprolol Succinate) .Marland Kitchen.. 1 Qd 15)  Triamcinolone Acetonide 0.025 % Crea (Triamcinolone Acetonide) .... Three Times A Day As Needed Rash 16)  Amlodipine Besylate 2.5 Mg Tabs (Amlodipine Besylate) .Marland Kitchen.. 1 Once Daily 17)  Venlafaxine Hcl 75 Mg Xr24h-Cap (Venlafaxine Hcl) .Marland Kitchen.. 1 Once Daily  Allergies: No Known Drug Allergies  Past History:  Past Medical History: Last updated: 03/13/2007 Diabetes mellitus, type I GERD Hypertension Smoker E.D. Dyslipiemia Depression Prostatism  Review of Systems  The patient denies syncope.         denies anxiety.  Physical Exam  General:  obese.  no distress  Pulses:  dorsalis pedis intact bilat.    Extremities:  no deformity.  no ulcer on the feet.  feet are of normal color and temp.  no edema  Neurologic:  sensation is intact to touch on the feet    Impression & Recommendations:  Problem # 1:  DIABETES MELLITUS, TYPE I (ICD-250.01) he appears to need a simpler  regimen  Problem # 2:  HYPERTENSION (ICD-401.9) well-controlled  Problem # 3:  DEPRESSION (ICD-311) needs increased rx  Medications Added to Medication List This Visit: 1)  Venlafaxine Hcl 37.5 Mg Xr24h-tab (Venlafaxine hcl) .Marland Kitchen.. 1 once daily 2)  Levemir 100 Unit/ml Soln (Insulin detemir) .... 70 units two times a day  Other Orders: Est. Patient Level IV (04540)  Patient Instructions: 1)  check your blood sugar 2  times a day.  vary the time of day when you check, between before the 3 meals, and at bedtime.  also check if you have symptoms of your blood sugar being too high or too low.  please keep a record of the readings and bring it to your next appointment here.  please call us sooner if you are having low blood sugar episodes. 2)  change both current insulins to levemir 70 units two times a day 3)  Please schedule a follow-up  appointment in 1 month. 4)  try venlafaxine at 37.5 mg qd. Prescriptions: LEVEMIR 100 UNIT/ML SOLN (INSULIN DETEMIR) 70 units two times a day  #14 vials x 3   Entered and Authorized by:   Minus Breeding MD   Signed by:   Minus Breeding MD on 03/19/2010   Method used:   Faxed to ...       [facility]             , [place]         Ph: [phone]       Fax: [phone]   RxID:   [id number] LEVEMIR 100 UNIT/ML SOLN (INSULIN DETEMIR) 70 units two times a day  #14 vials x 3   Entered and Authorized by:   Minus Breeding MD   Signed by:   Minus Breeding MD on 03/19/2010   Method used:   [facility]ron[facility]        [facility]       [address]       [place], [place]  [address]       Ph: [phone]       Fax: [phone]   RxID:   [id number] VENLAFAXINE HCL 37.5 MG XR24H-TAB (VENLAFAXINE HCL) 1 once daily  #90 x 3   Entered and Authorized by:   Minus Breeding MD   Signed by:   Minus Breeding MD on 03/19/2010   Method used:   Electronically to        [facility] (retail)       [address]       [place], [place]  [address]       Ph: [phone]       Fax: [phone]   RxID:   [id number] WVPXTG 100-25 MG  TABS (LOSARTAN POTASSIUM-HCTZ) take 1 by mouth qd  #90 x 3   Entered and Authorized by:   Minus Breeding MD   Signed by:   Minus Breeding MD on 03/19/2010   Method used:   Electronically to        [facility] (retail)       [address]       [place], [place]  [address]       Ph: [phone]       Fax: [phone]   RxID:   [id number]

## 2010-09-18 NOTE — Progress Notes (Signed)
Summary: elevated CBG  Phone Note Call from Patient Call back at Home Phone [phone]   Caller: Spouse VM OK Complaint: Abdominal Pain Summary of Call: Pt's spouse called to inform MD that pt's CBG are still elevated at 240 on 110u of Leverir two times a day. Spouse and pt are requesting med adjustment, please advise. Initial call taken by: Margaret Pyle, CMA,  July 03, 2010 10:23 AM  Follow-up for Phone Call        increase levemir to 120 units two times a day Follow-up by: Minus Breeding MD,  July 03, 2010 12:36 PM  Additional Follow-up for Phone Call Additional follow up Details #1::        Patient wife notified per MD..Lakisha Archie CMA  July 03, 2010 1:31 PM     New/Updated Medications: LEVEMIR 100 UNIT/ML SOLN (INSULIN DETEMIR) 120 units two times a day, and syringes for use once daily.

## 2010-09-18 NOTE — Miscellaneous (Signed)
Summary: Advance Directive for a Natural Death  Advance Directive for a Natural Death   Imported By: Lester Metompkin 03/22/2010 08:48:41  _____________________________________________________________________  External Attachment:    Type:   Image     Comment:   External Document

## 2010-09-18 NOTE — Progress Notes (Signed)
Summary: appointment ?  Phone Note Call from Patient Call back at [facility] Phone [phone]   Summary of Call: Pt has appt on 04/26/10. He states that he is using his old insulin (okay per MD) and has enough to last until his appointment. He has not started using the Levemir yet and wants to know if he should cancel his appointment until he begins using the new insulin. Please advise Initial call taken by: Brenton Grills MA,  April 10, 2010 11:41 AM  Follow-up for Phone Call        yes, please ret 2 weeks after starting new insulin Follow-up by: Minus Breeding MD,  April 10, 2010 12:34 PM  Additional Follow-up for Phone Call Additional follow up Details #1::        left message on VM with MD's advisement Additional Follow-up by: Brenton Grills MA,  April 10, 2010 1:21 PM

## 2010-09-18 NOTE — Progress Notes (Signed)
Summary: Lab orders  Phone Note Other Incoming   Caller: Lab Summary of Call: Pt went to lab today for blood work (liver, lipid panel, A1C) but there were no orders in the system for him per lab (sherri)--appt is Monday (8/1)--Ok to send orders for lab work?--please advise Initial call taken by: Brenton Grills MA,  March 13, 2010 11:11 AM  Follow-up for Phone Call        ok for labs - 250.02 Follow-up by: Corwin Levins MD,  March 15, 2010 1:07 PM  Additional Follow-up for Phone Call Additional follow up Details #1::        Pt's spouse and Lab informed Additional Follow-up by: Margaret Pyle, CMA,  March 15, 2010 2:02 PM

## 2010-09-18 NOTE — Assessment & Plan Note (Signed)
Summary: 3 MO ROV /NWS  #   Vital Signs:  Patient profile:   65 year old male Height:      76 inches (193.04 cm) Weight:      275.13 pounds (125.06 kg) O2 Sat:      90 % on Room air Temp:     96.7 degrees F (35.94 degrees C) oral Pulse rate:   80 / minute BP sitting:   142 / 72  (left arm) Cuff size:   large  Vitals Entered By: Josph Macho RMA (November 16, 2009 1:18 PM)  O2 Flow:  Room air CC: 3 month follow up/ CF Is Patient Diabetic? Yes   Primary Provider:  Maverick Patman  CC:  3 month follow up/ CF.  History of Present Illness: the status of at least 3 ongoing medical problems is addressed today: dm:  he takes and tolerates insulin, as rx'ed.  no cbg record, but states cbg's are highest in am (200), despite no hs-snack. pt says his wife has told him he was depressed.  he says cymbalta helped him, but it decreased his libido.   tobacco abuse:  pt says he has quit smoking, but he continues to need chantix.  Current Medications (verified): 1)  Metformin Hcl 1000 Mg  Tabs (Metformin Hcl) .... Take 1 By Mouth Two Times A Day 2)  Lisinopril 20 Mg  Tabs (Lisinopril) .... Take 1 By Mouth Qd 3)  Hyzaar 100-25 Mg  Tabs (Losartan Potassium-Hctz) .... Take 1 By Mouth Qd 4)  Humalog Pen 100 Unit/ml  Soln (Insulin Lispro (Human)) .... Tid (Qac), 20-20-15 Units. 5)  Precision Xtra Blood Glucose   Strp (Glucose Blood) .... Check Blood Sugar Three Times A Day , and Lancets 250.01 6)  Viagra 100 Mg Tabs (Sildenafil Citrate) .... As Needed 7)  Crestor 40 Mg  Tabs (Rosuvastatin Calcium) .Marland Kitchen.. 1 Once Daily 8)  Diazepam 5 Mg Tabs (Diazepam) .Marland Kitchen.. 1 Poi Two Times A Day As Needed 9)  Aspir-Low 81 Mg Tbec (Aspirin) .Marland Kitchen.. 1 Once Daily After Meal 10)  Humulin N Pen 100 Unit/ml  Susp (Insulin Isophane Human) .... 95 Units Every Night. 11)  Insulin Pen Needles, 29-Gauge, Half-Inch .... Qid 12)  Prilosec 20 Mg Cpdr (Omeprazole) .... Take 1 Tablet By Mouth Once A Day 13)  Chantix 1 Mg Tabs (Varenicline  Tartrate) .Marland Kitchen.. 1 Tab Two Times A Day 14)  Metoprolol Succinate 25 Mg Xr24h-Tab (Metoprolol Succinate) .Marland Kitchen.. 1 Qd 15)  Triamcinolone Acetonide 0.025 % Crea (Triamcinolone Acetonide) .... Three Times A Day As Needed Rash  Allergies (verified): No Known Drug Allergies  Past History:  Past Medical History: Last updated: 03/13/2007 Diabetes mellitus, type I GERD Hypertension Smoker E.D. Dyslipiemia Depression Prostatism  Review of Systems  The patient denies hypoglycemia.         denies ed sxs  Physical Exam  General:  obese.   Extremities:  no edema Additional Exam:  Hemoglobin A1C       [H]  7.6 %    Impression & Recommendations:  Problem # 1:  DIABETES MELLITUS, TYPE I (ICD-250.01) needs increased rx  Problem # 2:  TOBACCO ABUSE (ICD-305.1) Assessment: Improved  Problem # 3:  DEPRESSION (ICD-311) needs increased rx  Problem # 4:  HYPERTENSION (ICD-401.9) needs increased rx  Medications Added to Medication List This Visit: 1)  Humulin N Pen 100 Unit/ml Susp (Insulin isophane human) .Marland Kitchen.. 100 units every night. 2)  Amlodipine Besylate 2.5 Mg Tabs (Amlodipine besylate) .Marland Kitchen.. 1 once daily  3)  Venlafaxine Hcl 75 Mg Xr24h-cap (Venlafaxine hcl) .Marland Kitchen.. 1 once daily  Other Orders: TLB-A1C / Hgb A1C (Glycohemoglobin) (83036-A1C) Est. Patient Level IV ([address]109)  Patient Instructions: 1)  add amlodipine 2.5 mg once daily 2)  check your blood sugar 2  times a day.  vary the time of day when you check, between before the 3 meals, and at bedtime.  also check if you have symptoms of your blood sugar being too high or too low.  please keep a record of the readings and bring it to your next appointment here.  please call us sooner if you are having low blood sugar episodes. 3)  continue humalog three times a day (qac) 20-20-15 units 4)  increase nph to 100 units at bedtime. 5)  Please schedule a follow-up appointment in 3 months. 6)  add effexor-xr (venlafaxine) 75 mg once  daily. Prescriptions: VENLAFAXINE HCL 75 MG XR24H-CAP (VENLAFAXINE HCL) 1 once daily  #30 x 11   Entered and Authorized by:   Minus Breeding MD   Signed by:   Minus Breeding MD on 11/16/2009   Method used:   Electronically to        [facility]  [address] [phone]* (retail)       [address]       [place], [place]  [address]       Ph: [phone] or [phone]       Fax: [phone]   RxID:   [id number] CHANTIX 1 MG TABS (VARENICLINE TARTRATE) 1 tab two times a day  #60 x 11   Entered and Authorized by:   Minus Breeding MD   Signed by:   Minus Breeding MD on 11/16/2009   Method used:   [facility] to        [facility]  [address] [phone]* (retail)       [address]       [place], [place]  [address]       Ph: [phone] or [phone]       Fax: [phone]   RxID:   [id number] METOPROLOL SUCCINATE 25 MG XR24H-TAB (METOPROLOL SUCCINATE) 1 qd  #90 x 3   Entered and Authorized by:   Minus Breeding MD   Signed by:   Minus Breeding MD on 11/16/2009   Method used:   Faxed to ...       [address] [facility] (mail-order)             , [place]         Ph: [phone]       Fax: [phone]   RxID:   [id number] AMLODIPINE BESYLATE 2.5 MG TABS (AMLODIPINE BESYLATE) 1 once daily  #90 x 3   Entered and Authorized by:   Minus Breeding MD   Signed by:   Minus Breeding MD on 11/16/2009   Method used:   Faxed to ...       [facility] (mail-order)             , [place]         Ph: [phone]       Fax: [phone]   RxID:   [id number]

## 2010-09-20 NOTE — Assessment & Plan Note (Signed)
Summary: PER WIFE PHYSICAL WK OF 07/30/10-SAYS INSUR/[employer]ET[employer] WILL PAY--STC   Vital Signs:  Patient profile:   65 year old male Height:      76 inches (193.04 cm) Weight:      278.38 pounds (126.54 kg) BMI:     34.01 O2 Sat:      90 % on Room air Temp:     97.7 degrees F (36.50 degrees C) oral Pulse rate:   75 / minute BP sitting:   124 / 68  (left arm) Cuff size:   large  Vitals Entered By: Brenton Grills CMA Duncan Dull) (July 30, 2010 8:27 AM)  O2 Flow:  Room air CC: CPX/aj Is Patient Diabetic? Yes   Primary Provider:  Random Dobrowski  CC:  CPX/aj.  History of Present Illness: here for regular wellness examination.  He's feeling pretty well in general.  Current Medications (verified): 1)  Metformin Hcl 1000 Mg  Tabs (Metformin Hcl) .... Take 1 By Mouth Two Times A Day 2)  Hyzaar 100-25 Mg  Tabs (Losartan Potassium-Hctz) .... Take 1 Tablet By Mouth Once A Day 3)  Precision Xtra Blood Glucose   Strp (Glucose Blood) .... Check Blood Sugar Three Times A Day , and Lancets 250.01 4)  Viagra 100 Mg Tabs (Sildenafil Citrate) .... As Needed 5)  Crestor 40 Mg  Tabs (Rosuvastatin Calcium) .Marland Kitchen.. 1 Once Daily 6)  Diazepam 5 Mg Tabs (Diazepam) .Marland Kitchen.. 1 By Mouth Two Times A Day As Needed 7)  Aspir-Low 81 Mg Tbec (Aspirin) .Marland Kitchen.. 1 Once Daily After Meal 8)  Prilosec 20 Mg Cpdr (Omeprazole) .... Take 1 Tablet By Mouth Once A Day 9)  Chantix 1 Mg Tabs (Varenicline Tartrate) .Marland Kitchen.. 1 Tab Two Times A Day 10)  Metoprolol Succinate 25 Mg Xr24h-Tab (Metoprolol Succinate) .... Take 1 Tablet By Mouth Once A Day 11)  Amlodipine Besylate 2.5 Mg Tabs (Amlodipine Besylate) .Marland Kitchen.. 1 Once Daily 12)  Levemir 100 Unit/ml Soln (Insulin Detemir) .Marland Kitchen.. 160 Units Two Times A Day, and Syringes For Use Once Daily. 13)  Zyrtec Allergy 10 Mg Tabs (Cetirizine Hcl) .... Take 1 Tablet By Mouth Once A Day As Needed  Allergies (verified): No Known Drug Allergies  Family History: Reviewed history from 11/24/2008 and no changes  required. no cancer mother-CVA, deceased  Social History: Reviewed history from 11/24/2008 and no changes required. married with children.   works Contractor alcohol: 2/month Patient states former smoker. 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  Smoked x 35 years 1ppd.    Review of Systems  The patient denies weight loss, weight gain, vision loss, decreased hearing, chest pain, syncope, dyspnea on exertion, prolonged cough, headaches, abdominal pain, melena, hematochezia, severe indigestion/heartburn, hematuria, suspicious skin lesions, and depression.    Physical Exam  General:  normal appearance.   Neck:  Supple without thyroid enlargement or tenderness.  Lungs:  Clear to auscultation bilaterally. Normal respiratory effort.  Heart:  Regular rate and rhythm without murmurs or gallops noted. Normal S1,S2.   Abdomen:  abdomen is soft, nontender.  no hepatosplenomegaly.   not distended.  no hernia  Rectal:  declined Prostate:  declined Msk:  muscle bulk and strength are grossly normal.  no obvious joint swelling.  gait is normal and steady  Extremities:  no deformity.  no ulcer on the feet.  feet are of normal color and temp.  no edema.  there are healing bilat ingrown toenail resections of the great toes.   Neurologic:  cn 2-12 grossly intact.   readily moves all  4's.    Skin:  normal texture and temp.  no rash.  not diaphoretic dry.   Cervical Nodes:  No significant adenopathy.  Psych:  Alert and cooperative; normal mood and affect; normal attention span and concentration.   Additional Exam:  SEPARATE EVALUATION FOLLOWS--EACH PROBLEM HERE IS NEW, NOT RESPONDING TO TREATMENT, OR POSES SIGNIFICANT RISK TO THE PATIENT'S HEALTH: HISTORY OF THE PRESENT ILLNESS: pt states of 2 years of moderate congestion in the nose, but no assoc earache.  abx given for podiatry have not helped.  pt says he has increased levemir to 160 units two times a day.  no cbg record, but states cbg's vary between  125-250.  it is in general higher later in the day. PAST MEDICAL HISTORY reviewed and up to date today REVIEW OF SYSTEMS: denies hypoglycemia and fever. PHYSICAL EXAMINATION: head: no deformity eyes: no periorbital swelling, no proptosis external nose and ears are normal mouth: no lesion seen dorsalis pedis intact bilat.  no carotid bruit sensation is intact to touch on the feet LAB/XRAY RESULTS: Fructosamine         [H]  351 umol/L   (converts to a1c of 7.6) IMPRESSION: dm, needs increased rx allergic rhinitis.  new PLAN: see instruction page   Impression & Recommendations:  Problem # 1:  WELL ADULT (ICD-V70.0)  Medications Added to Medication List This Visit: 1)  Levemir 100 Unit/ml Soln (Insulin detemir) .... 200 units each am, and 160 units each evening, and syringes two times a day  Other Orders: EKG w/ Interpretation (93000) T-Fructosamine (53664-40347) TLB-IBC Pnl (Iron/FE;Transferrin) (83550-IBC) TLB-B12, Serum-Total ONLY (42595-G38) Est. Patient Level III (75643) Est. Patient 40-64 years (32951)  Patient Instructions: 1)  increase levemir to 200 units each am and 160 units each evening. 2)  blood tests are being ordered for you today.  please call [phone] to hear your test results. 3)  check your blood sugar 2 times a day.  vary the time of day when you check, between before the 3 meals, and at bedtime.  also check if you have symptoms of your blood sugar being too high or too low.  please keep a record of the readings and bring it to your next appointment here.  please call us sooner if you are having low blood sugar episodes. 4)  Please schedule a follow-up appointment in 3 months. 5)  please consider these measures for your health:  minimize alcohol.  do not use tobacco products.  have a colonoscopy at least every 10 years from age 58.  keep firearms safely stored.  always use seat belts.  have working smoke alarms in your home.  see an eye doctor and dentist  regularly.  never drive under the influence of alcohol or drugs (including prescription drugs).  those with fair skin should take precautions against the sun. 6)  please let me know what your wishes would be, if artificial life support measures should become necessary.  it is critically important to prevent falling down (keep floor areas well-lit, dry, and free of loose objects) 7)  here are some samples of "nasonex."  if this helps, call, and i can send a prescription for a generic alternative to [employer].  i can also refer you to an allergy specialist if you wish.   8)  (update: we discussed code status.  pt requests full code, but would not want to be started or maintained on artificial life-support measures if there was not a reasonable chance of recovery) 9)  (  update: i left message on phone-tree:  rx as we discussed) Prescriptions: LEVEMIR 100 UNIT/ML SOLN (INSULIN DETEMIR) 200 units each am, and 160 units each evening, and syringes two times a day  #36 vials x 3   Entered and Authorized by:   Minus Breeding MD   Signed by:   Minus Breeding MD on 07/30/2010   Method used:   Faxed to ...       [facility] (mail-order)             , [place]         Ph: [phone]       Fax: [phone]   RxID:   [id number]    Orders Added: 1)  EKG w/ Interpretation [93000] 2)  T-Fructosamine [phone] 3)  TLB-IBC Pnl (Iron/FE;Transferrin) [83550-IBC] 4)  TLB-B12, Serum-Total ONLY [82607-B12] 5)  Est. Patient Level III [27253] 6)  Est. Patient 40-64 years [66440]

## 2010-10-22 ENCOUNTER — Other Ambulatory Visit: Payer: Managed Care, Other (non HMO)

## 2010-10-22 ENCOUNTER — Other Ambulatory Visit: Payer: Self-pay | Admitting: Endocrinology

## 2010-10-22 ENCOUNTER — Encounter (INDEPENDENT_AMBULATORY_CARE_PROVIDER_SITE_OTHER): Payer: Self-pay | Admitting: *Deleted

## 2010-10-22 ENCOUNTER — Telehealth: Payer: Self-pay | Admitting: Endocrinology

## 2010-10-22 DIAGNOSIS — E109 Type 1 diabetes mellitus without complications: Secondary | ICD-10-CM

## 2010-10-22 LAB — HEMOGLOBIN A1C: Hgb A1c MFr Bld: 10 % — ABNORMAL HIGH (ref 4.6–6.5)

## 2010-10-29 ENCOUNTER — Encounter: Payer: Self-pay | Admitting: Endocrinology

## 2010-10-29 ENCOUNTER — Ambulatory Visit (INDEPENDENT_AMBULATORY_CARE_PROVIDER_SITE_OTHER): Payer: Managed Care, Other (non HMO) | Admitting: Endocrinology

## 2010-10-29 DIAGNOSIS — E109 Type 1 diabetes mellitus without complications: Secondary | ICD-10-CM

## 2010-10-30 NOTE — Progress Notes (Signed)
Summary: lab?  Phone Note From Other Clinic   Caller: Lab Summary of Call: Pt came into lab this morning to have blood drawn, but there are not any orders for him at prev. OV. Pt was advised to ret in 3 mo-please advise Initial call taken by: Brenton Grills CMA Duncan Dull),  October 22, 2010 9:27 AM  Follow-up for Phone Call        i was thinking we would do a1c at next ov, but some pts like to come ahead of time.  he can have a1c 250.01 Follow-up by: Minus Breeding MD,  October 22, 2010 9:41 AM  Additional Follow-up for Phone Call Additional follow up Details #1::        Shakira in Lab informed. Additional Follow-up by: Brenton Grills CMA Duncan Dull),  October 22, 2010 10:10 AM

## 2010-11-01 ENCOUNTER — Encounter: Payer: Self-pay | Admitting: Endocrinology

## 2010-11-05 ENCOUNTER — Telehealth: Payer: Self-pay | Admitting: Endocrinology

## 2010-11-06 NOTE — Assessment & Plan Note (Signed)
Summary: 3 month follow up/ stc   Vital Signs:  Patient profile:   65 year old male Height:      76 inches (193.04 cm) Weight:      285.13 pounds (129.60 kg) BMI:     34.83 O2 Sat:      92 % on Room air Temp:     97.7 degrees F (36.50 degrees C) oral Pulse rate:   82 / minute Pulse rhythm:   regular BP sitting:   124 / 62  (left arm) Cuff size:   large  Vitals Entered By: Brenton Grills CMA Duncan Dull) (October 29, 2010 1:07 PM)  O2 Flow:  Room air CC: 3 month F/U/aj Is Patient Diabetic? Yes   Primary Provider:  Naseem Varden  CC:  3 month F/U/aj.  History of Present Illness: no cbg record, but states cbg's are often in the low-100's.  he says it is higher in am, than later in the day.  pt states he feels well in general, except for weight gain.    Current Medications (verified): 1)  Metformin Hcl 1000 Mg  Tabs (Metformin Hcl) .... Take 1 By Mouth Two Times A Day 2)  Hyzaar 100-25 Mg  Tabs (Losartan Potassium-Hctz) .... Take 1 Tablet By Mouth Once A Day 3)  Precision Xtra Blood Glucose   Strp (Glucose Blood) .... Check Blood Sugar Three Times A Day , and Lancets 250.01 4)  Viagra 100 Mg Tabs (Sildenafil Citrate) .... As Needed 5)  Crestor 40 Mg  Tabs (Rosuvastatin Calcium) .Marland Kitchen.. 1 Once Daily 6)  Diazepam 5 Mg Tabs (Diazepam) .Marland Kitchen.. 1 By Mouth Two Times A Day As Needed 7)  Aspir-Low 81 Mg Tbec (Aspirin) .Marland Kitchen.. 1 Once Daily After Meal 8)  Prilosec 20 Mg Cpdr (Omeprazole) .... Take 1 Tablet By Mouth Once A Day 9)  Chantix 1 Mg Tabs (Varenicline Tartrate) .Marland Kitchen.. 1 Tab Two Times A Day 10)  Metoprolol Succinate 25 Mg Xr24h-Tab (Metoprolol Succinate) .... Take 1 Tablet By Mouth Once A Day 11)  Amlodipine Besylate 2.5 Mg Tabs (Amlodipine Besylate) .Marland Kitchen.. 1 Once Daily 12)  Levemir 100 Unit/ml Soln (Insulin Detemir) .... 200 Units Each Am, and 160 Units Each Evening, and Syringes Two Times A Day  Allergies (verified): No Known Drug Allergies  Past History:  Past Medical History: Last updated:  03/13/2007 Diabetes mellitus, type I GERD Hypertension Smoker E.D. Dyslipiemia Depression Prostatism  Review of Systems  The patient denies hypoglycemia.    Physical Exam  General:  normal appearance.   Genitalia:  normal except for small right spermatocoele Skin:  injection sites at the anterior abdomen are without lesions.     Impression & Recommendations:  Problem # 1:  DIABETES MELLITUS, TYPE I (ICD-250.01) we should further simplify his regimen needs increased rx  HgbA1C: 10.0 (10/22/2010)  Medications Added to Medication List This Visit: 1)  Lantus 100 Unit/ml Soln (Insulin glargine) .... 400 units each am  Other Orders: Est. Patient Level III (16109)  Patient Instructions: 1)  increase levemir to lantus 400 unit each morning.   2)  check your blood sugar 2 times a day.  vary the time of day when you check, between before the 3 meals, and at bedtime.  also check if you have symptoms of your blood sugar being too high or too low.  please keep a record of the readings and bring it to your next appointment here.  please call us sooner if you are having low blood sugar episodes. 3)  Please schedule a follow-up appointment in 3 months. 4)  (a1c prior 250.01) Prescriptions: VIAGRA 100 MG TABS (SILDENAFIL CITRATE) as needed  #36 x 3   Entered and Authorized by:   Minus Breeding MD   Signed by:   Minus Breeding MD on 10/29/2010   Method used:   Electronically to        [facility]* (mail-order)             , [place]         Ph: [phone]       Fax: [phone]   RxID:   [id number] LANTUS 100 UNIT/ML SOLN (INSULIN GLARGINE) 400 units each am  #40 vials x 3   Entered and Authorized by:   Minus Breeding MD   Signed by:   Minus Breeding MD on 10/29/2010   Method used:   Electronically to        [facility]* (mail-order)             , [place]         Ph: [phone]       Fax: [phone]   RxID:   [id number]    Orders Added: 1)  Est. Patient Level III [75643]

## 2010-11-15 NOTE — Progress Notes (Signed)
Summary: Med clarification  Phone Note From Pharmacy   Caller: Monia Pouch [phone] Ref [phone] Summary of Call: Pharmacy called to verify dosage of pt Rx for Lantus 400u qam, please advise. Initial call taken by: Margaret Pyle, CMA,  November 05, 2010 1:57 PM  Follow-up for Phone Call        yes, i realize it is a high dosage Follow-up by: Minus Breeding MD,  November 05, 2010 2:20 PM  Additional Follow-up for Phone Call Additional follow up Details #1::        [facility] pharmacy technician and [facility] pharmacist advised of above and also advised that Levermir was discontinued Additional Follow-up by: Margaret Pyle, CMA,  November 05, 2010 2:43 PM

## 2010-11-15 NOTE — Medication Information (Signed)
Summary: Clarification for Lantus/[employer]  Clarification for Lantus/[employer]   Imported By: Sherian Rein 11/05/2010 12:27:50  _____________________________________________________________________  External Attachment:    Type:   Image     Comment:   External Document

## 2010-11-29 LAB — CBC
HCT: 32.2 % — ABNORMAL LOW (ref 39.0–52.0)
HCT: 31.2 % — ABNORMAL LOW (ref 39.0–52.0)
Hemoglobin: 10.3 g/dL — ABNORMAL LOW (ref 13.0–17.0)
Hemoglobin: 11.3 g/dL — ABNORMAL LOW (ref 13.0–17.0)
Hemoglobin: 10.8 g/dL — ABNORMAL LOW (ref 13.0–17.0)
MCHC: 34.2 g/dL (ref 30.0–36.0)
MCHC: 34.7 g/dL (ref 30.0–36.0)
MCHC: 35.1 g/dL (ref 30.0–36.0)
MCHC: 34.6 g/dL (ref 30.0–36.0)
MCV: 95.8 fL (ref 78.0–100.0)
MCV: 98.1 fL (ref 78.0–100.0)
MCV: 96.8 fL (ref 78.0–100.0)
Platelets: 116 10*3/uL — ABNORMAL LOW (ref 150–400)
Platelets: 116 10*3/uL — ABNORMAL LOW (ref 150–400)
Platelets: 119 10*3/uL — ABNORMAL LOW (ref 150–400)
Platelets: 112 10*3/uL — ABNORMAL LOW (ref 150–400)
RBC: 3.36 MIL/uL — ABNORMAL LOW (ref 4.22–5.81)
RBC: 3.23 MIL/uL — ABNORMAL LOW (ref 4.22–5.81)
RDW: 12.4 % (ref 11.5–15.5)
RDW: 13.2 % (ref 11.5–15.5)
RDW: 13.2 % (ref 11.5–15.5)
RDW: 12.8 % (ref 11.5–15.5)
WBC: 8.7 10*3/uL (ref 4.0–10.5)
WBC: 13.3 10*3/uL — ABNORMAL HIGH (ref 4.0–10.5)

## 2010-11-29 LAB — GLUCOSE, CAPILLARY
Glucose-Capillary: 145 mg/dL — ABNORMAL HIGH (ref 70–99)
Glucose-Capillary: 146 mg/dL — ABNORMAL HIGH (ref 70–99)
Glucose-Capillary: 156 mg/dL — ABNORMAL HIGH (ref 70–99)
Glucose-Capillary: 167 mg/dL — ABNORMAL HIGH (ref 70–99)
Glucose-Capillary: 175 mg/dL — ABNORMAL HIGH (ref 70–99)
Glucose-Capillary: 182 mg/dL — ABNORMAL HIGH (ref 70–99)
Glucose-Capillary: 184 mg/dL — ABNORMAL HIGH (ref 70–99)
Glucose-Capillary: 189 mg/dL — ABNORMAL HIGH (ref 70–99)
Glucose-Capillary: 204 mg/dL — ABNORMAL HIGH (ref 70–99)
Glucose-Capillary: 212 mg/dL — ABNORMAL HIGH (ref 70–99)
Glucose-Capillary: 227 mg/dL — ABNORMAL HIGH (ref 70–99)
Glucose-Capillary: 244 mg/dL — ABNORMAL HIGH (ref 70–99)
Glucose-Capillary: 247 mg/dL — ABNORMAL HIGH (ref 70–99)
Glucose-Capillary: 251 mg/dL — ABNORMAL HIGH (ref 70–99)
Glucose-Capillary: 269 mg/dL — ABNORMAL HIGH (ref 70–99)
Glucose-Capillary: 270 mg/dL — ABNORMAL HIGH (ref 70–99)
Glucose-Capillary: 289 mg/dL — ABNORMAL HIGH (ref 70–99)
Glucose-Capillary: 294 mg/dL — ABNORMAL HIGH (ref 70–99)
Glucose-Capillary: 295 mg/dL — ABNORMAL HIGH (ref 70–99)
Glucose-Capillary: 370 mg/dL — ABNORMAL HIGH (ref 70–99)
Glucose-Capillary: 75 mg/dL (ref 70–99)
Glucose-Capillary: 264 mg/dL — ABNORMAL HIGH (ref 70–99)

## 2010-11-29 LAB — DIFFERENTIAL
Basophils Absolute: 0 10*3/uL (ref 0.0–0.1)
Basophils Relative: 0 % (ref 0–1)
Eosinophils Relative: 1 % (ref 0–5)
Lymphocytes Relative: 10 % — ABNORMAL LOW (ref 12–46)
Lymphs Abs: 0.9 10*3/uL (ref 0.7–4.0)
Monocytes Absolute: 0.3 10*3/uL (ref 0.1–1.0)
Monocytes Absolute: 0.9 10*3/uL (ref 0.1–1.0)
Neutro Abs: 8.1 10*3/uL — ABNORMAL HIGH (ref 1.7–7.7)
Neutro Abs: 11 10*3/uL — ABNORMAL HIGH (ref 1.7–7.7)
Neutrophils Relative %: 83 % — ABNORMAL HIGH (ref 43–77)

## 2010-11-29 LAB — URINALYSIS, ROUTINE W REFLEX MICROSCOPIC
Bilirubin Urine: NEGATIVE
Glucose, UA: 500 mg/dL — AB
Glucose, UA: NEGATIVE mg/dL
Hgb urine dipstick: NEGATIVE
Ketones, ur: NEGATIVE mg/dL
Ketones, ur: 15 mg/dL — AB
Leukocytes, UA: NEGATIVE
Nitrite: NEGATIVE
Protein, ur: 30 mg/dL — AB
Protein, ur: 30 mg/dL — AB
Specific Gravity, Urine: 1.027 (ref 1.005–1.030)
Urobilinogen, UA: 1 mg/dL (ref 0.0–1.0)
pH: 5.5 (ref 5.0–8.0)
pH: 5 (ref 5.0–8.0)

## 2010-11-29 LAB — BASIC METABOLIC PANEL
BUN: 18 mg/dL (ref 6–23)
BUN: 36 mg/dL — ABNORMAL HIGH (ref 6–23)
CO2: 19 mEq/L (ref 19–32)
CO2: 29 mEq/L (ref 19–32)
Calcium: 7.4 mg/dL — ABNORMAL LOW (ref 8.4–10.5)
Calcium: 8.7 mg/dL (ref 8.4–10.5)
Chloride: 102 mEq/L (ref 96–112)
Creatinine, Ser: 1.89 mg/dL — ABNORMAL HIGH (ref 0.4–1.5)
GFR calc Af Amer: 44 mL/min — ABNORMAL LOW (ref >60)
GFR calc non Af Amer: 37 mL/min — ABNORMAL LOW (ref >60)
Glucose, Bld: 246 mg/dL — ABNORMAL HIGH (ref 70–99)
Glucose, Bld: 283 mg/dL — ABNORMAL HIGH (ref 70–99)
Potassium: 4.2 mEq/L (ref 3.5–5.1)
Potassium: 4.4 mEq/L (ref 3.5–5.1)
Sodium: 131 mEq/L — ABNORMAL LOW (ref 135–145)
Sodium: 137 mEq/L (ref 135–145)

## 2010-11-29 LAB — POCT I-STAT 3, ART BLOOD GAS (G3+)
Acid-base deficit: 7 mmol/L — ABNORMAL HIGH (ref 0.0–2.0)
O2 Saturation: 97 %

## 2010-11-29 LAB — COMPREHENSIVE METABOLIC PANEL
ALT: 38 U/L (ref 0–53)
AST: 26 U/L (ref 0–37)
AST: 36 U/L (ref 0–37)
Albumin: 2.6 g/dL — ABNORMAL LOW (ref 3.5–5.2)
Albumin: 2.6 g/dL — ABNORMAL LOW (ref 3.5–5.2)
Albumin: 2.8 g/dL — ABNORMAL LOW (ref 3.5–5.2)
Alkaline Phosphatase: 75 U/L (ref 39–117)
Alkaline Phosphatase: 48 U/L (ref 39–117)
BUN: 24 mg/dL — ABNORMAL HIGH (ref 6–23)
BUN: 38 mg/dL — ABNORMAL HIGH (ref 6–23)
CO2: 21 mEq/L (ref 19–32)
Calcium: 8 mg/dL — ABNORMAL LOW (ref 8.4–10.5)
Calcium: 8.3 mg/dL — ABNORMAL LOW (ref 8.4–10.5)
Chloride: 107 mEq/L (ref 96–112)
Creatinine, Ser: 1.31 mg/dL (ref 0.4–1.5)
GFR calc Af Amer: >60 mL/min (ref >60)
GFR calc Af Amer: >60 mL/min (ref >60)
GFR calc non Af Amer: 30 mL/min — ABNORMAL LOW (ref >60)
Glucose, Bld: 226 mg/dL — ABNORMAL HIGH (ref 70–99)
Potassium: 4.2 mEq/L (ref 3.5–5.1)
Potassium: 4.4 mEq/L (ref 3.5–5.1)
Sodium: 139 mEq/L (ref 135–145)
Total Bilirubin: 1 mg/dL (ref 0.3–1.2)
Total Protein: 5.9 g/dL — ABNORMAL LOW (ref 6.0–8.3)

## 2010-11-29 LAB — STREP PNEUMONIAE URINARY ANTIGEN: Strep Pneumo Urinary Antigen: NEGATIVE

## 2010-11-29 LAB — URINE CULTURE
Colony Count: NO GROWTH
Culture: NO GROWTH

## 2010-11-29 LAB — LEGIONELLA ANTIGEN, URINE: Legionella Antigen, Urine: NEGATIVE

## 2010-11-29 LAB — URINE MICROSCOPIC-ADD ON

## 2010-11-29 LAB — LACTIC ACID, PLASMA: Lactic Acid, Venous: 2.7 mmol/L — ABNORMAL HIGH (ref 0.5–2.2)

## 2010-11-29 LAB — GLUCOSE, RANDOM: Glucose, Bld: 313 mg/dL — ABNORMAL HIGH (ref 70–99)

## 2010-11-29 LAB — CULTURE, BLOOD (ROUTINE X 2)

## 2010-11-29 LAB — HEMOGLOBIN A1C
Hgb A1c MFr Bld: 7.2 % — ABNORMAL HIGH (ref 4.6–6.1)
Mean Plasma Glucose: 160 mg/dL

## 2010-11-29 LAB — PROTIME-INR: Prothrombin Time: 15.8 seconds — ABNORMAL HIGH (ref 11.6–15.2)

## 2010-11-29 LAB — APTT: aPTT: 49 seconds — ABNORMAL HIGH (ref 24–37)

## 2010-11-29 LAB — PHOSPHORUS
Phosphorus: 2.3 mg/dL (ref 2.3–4.6)
Phosphorus: 1.6 mg/dL — ABNORMAL LOW (ref 2.3–4.6)

## 2010-11-29 LAB — BRAIN NATRIURETIC PEPTIDE: Pro B Natriuretic peptide (BNP): 181 pg/mL — ABNORMAL HIGH (ref 0.0–100.0)

## 2010-11-29 LAB — MAGNESIUM: Magnesium: 1.8 mg/dL (ref 1.5–2.5)

## 2010-11-29 LAB — H1N1 SCREEN (PCR): H1N1 Virus Scrn: NOT DETECTED

## 2011-01-01 NOTE — Discharge Summary (Signed)
NAMEBRAN, ALDRIDGE               ACCOUNT NO.:  [id number]   MEDICAL RECORD NO.:  [id number]          PATIENT TYPE:  INP   LOCATION:  4715                         FACILITY:  [facility]   PHYSICIAN:  Oley Balm. Sung Amabile, MD   DATE OF BIRTH:  05-24-1946   DATE OF ADMISSION:  10/31/2008  DATE OF DISCHARGE:  11/07/2008                               DISCHARGE SUMMARY   DISCHARGE DIAGNOSES:  1. Community-acquired pneumonia with resultant respiratory failure and      Strep pneumoniae bacteremia.  2. Right lower lobe lung nodule.  3. Hypertension.  4. Diabetes.   LABORATORY DATA:  Date November 06, 2008, sodium 137, potassium 4.2,  chloride 100, CO2 of 29, glucose 246, BUN 18, and creatinine 1.05.  Blood cultures x2 on October 31, 2008, both positive for Strep pneumoniae.  H1N1 PCR not detected.  CBC obtained on November 03, 2008, white blood cell  count 9.5, hemoglobin 10.3, hematocrit 29.7, and platelet count 119.   RADIOLOGY:  CAT scan of chest obtained on November 05, 2008, demonstrates  moderate left lower lobe air space disease compatible with pneumonia,  small left parapneumonic effusion.  Small right lower lobe lung nodules.   BRIEF HISTORY:  A 65 year old white male patient with a known history of  chronic obstructive pulmonary disease, insulin-dependent diabetes, and  hypertension, who presented to the emergency room with fever and  shortness of breath.  This chest x-ray showed left lower lobe pneumonia.  Also labs consistent with urinary tract infection.  He was hypotensive  upon arrival with systolic blood pressure in the 60s which did respond  to fluid bolus.  He was admitted to the Intensive Care with symptoms  compatible with systemic inflammatory response syndrome and severe  sepsis in the setting of pneumonia and urinary tract infection.   HOSPITAL COURSE BY DISCHARGE DIAGNOSES:  1. Acute respiratory failure secondary to left lower lobe community-      acquired pneumonia (Streptococcus  pneumoniae with resultant      bacteremia).  Mr. Pierotti was admitted to the Intensive Care.      Therapeutic measures included supplemental oxygen, aggressive      bronchodilators, and empiric antibiotics.  He was initially started      on vancomycin and Cipro on October 31, 2008, intravenously.  On November 01, 2008, the Cipro was discontinued and he was maintained on      vancomycin in addition to azithromycin, ceftriaxone, and Tamiflu.      Later that day, on November 01, 2008, Strep pneumoniae was isolated in      blood cultures.  He continued to improve over the course of his      hospitalization.  Antibiotics were narrowed, and he is now on oral      Levaquin.  From a pulmonary standpoint, Mr. Blais remains hypoxic      with exertional oxygen saturations dropping down to the 82% range.      On CAT scan. he does still continue to demonstrate left lower lobe      pneumonia with small peripneumonic effusion.  From a pulmonary      standpoint, he will be discharged to home on supplemental oxygen at      2 liters, as well as with instructions to continue p.r.n.      Combivent.  It is felt that he will be eventually able to wean off      oxygen altogether.  2. Pulmonary nodule.  This was identified by CAT scan and will be      followed up in the outpatient setting.  3. Hypertension.  Plan for this is to continue home medications.  4. diabetes.  Plan for this is to continue home insulin.   DISCHARGE INSTRUCTIONS:  Diet as tolerated.  Activity, increase as  tolerated.  Instructed to refrain from returning to work until seen by  Dr. Vassie Loll for respiratory clearance on November 24, 2008, followup.   DISCHARGE MEDICATIONS:  1. Humalog 20 units subcu 3 times a day.  2. Humalog 50 units once daily.  3. Prilosec 40 mg daily.  4. Lisinopril 20 mg daily.  5. Hyzaar 100/25 once daily.  6. Zetia 10 mg daily.  7. Crestor 40 mg daily.  8. Multivitamin daily.  9. Aspirin 81 mg daily.  10.Viagra 100 mg as  needed p.r.n.  11.Diazepam 5 mg as needed.  12.Combivent inhaled 2 puffs up to 4 times a day as needed for      shortness of breath.  13.Levaquin 750 mg daily to be discontinued on November 10, 2008.  14.Oxygen 2 liters at all times.   FOLLOWUP:  Noted with Dr. Vassie Loll on November 24, 2008, at 10 a.m.  He was also  instructed to see Dr. Everardo All in 1-2 weeks for followup.   DISPOSITION:  Mr. Homan has met maximum benefit from inpatient stay.  He  is now cleared for discharge with outpatient followup as indicated.      Zenia Resides, NP      Oley Balm. Sung Amabile, MD  Electronically Signed    PB/MEDQ  D:  11/07/2008  T:  11/07/2008  Job:  [id number]

## 2011-01-01 NOTE — Assessment & Plan Note (Signed)
[facility]                            CARDIOLOGY OFFICE NOTE   NIKITA, HUMBLE                        MRN:          [id number]  DATE:01/06/2007                            DOB:          Nov 11, 1945    [name] returns for followup today.  He is a very pleasant gentleman  who was recently admitted to [facility] with acute respiratory  failure.  During that admission his troponin was mildly elevated.  Note,  he had an echocardiogram that showed preserved LV function.  He also had  a Myoview in January that showed an ejection fraction 56%.  There was a  small prior inferior septal infarct but there was no ischemia noted.  We  had planned to proceed with a cardiac catheterization but the patient  did cancel this as he stated he had an upper respiratory infection  again.  Since that time he denies any dyspnea on exertion, orthopnea,  PND, pedal edema, palpitations, presyncope, syncope, or chest pain.  He  has discontinued his tobacco use.   MEDICATIONS:  1. Insulin.  2. Metformin 1000 mg p.o. b.i.d.  3. Prilosec 40 mg p.o. daily.  4. Lisinopril 20 mg p.o. daily.  5. Hyzaar 100/25 daily.  6. Zetia 10 mg p.o. daily.  7. Crestor 40 mg p.o. daily.  8. Multivitamin daily.  9. Aspirin 81 mg p.o. daily.   PHYSICAL EXAMINATION:  Shows a blood pressure of 131/73 and his pulse is  104.  HEENT:  Normal.  NECK:  Supple with no bruits.  CHEST:  Clear to auscultation with normal expansion.  CARDIOVASCULAR:  Regular rate and rhythm.  I can not appreciate murmurs.  ABDOMINAL:  Benign.  EXTREMITIES:  Show no edema.   DIAGNOSES:  1. Hospitalization in February with respiratory failure and mildly      increased troponin I - we had planned to proceed with cardiac      catheterization given his history of multiple risk factors      including 15 years of diabetes mellitus.  It should also be noted      that during that admission he had a CT that  showed coronary artery      calcifications.  However, the patient has rethought this and would      prefer medical therapy.  We have discussed the risks of undiagnosed      coronary disease including myocardial infarction and death, and he      understands this.  It is also reasonable to continue with medical      therapy given that his Myoview in January was low risk.  We will      plan to repeat this in January of 2009.  If he has significant      ischemia then he would consider catheterization at that point.  I      have also asked him to contact us if he has any new symptoms such      as increasing dyspnea or chest pain.  I will see him back in 6  months for this issue.  2. Diabetes mellitus - Per Dr. Everardo All.  3. Hypertension - his blood pressure is well controlled on his present      medications.  4. Tobacco abuse - now resolved.  5. Chronic obstructive pulmonary disease.  6. Hyperlipidemia - we will check lipids and liver, and adjust his      medications as indicated with a goal LDL of less than 70.     Madolyn Frieze Jens Som, MD, [facility]  Electronically Signed    BSC/MedQ  DD: 01/06/2007  DT: 01/06/2007  Job #: [id number]

## 2011-01-01 NOTE — Discharge Summary (Signed)
[name], [name]               ACCOUNT NO.:  [id number]   MEDICAL RECORD NO.:  [id number]          PATIENT TYPE:  INP   LOCATION:  4715                         FACILITY:  [facility]   PHYSICIAN:  Nelda Bucks, MD DATE OF BIRTH:  [birth year]   DATE OF ADMISSION:  10/31/2008  DATE OF DISCHARGE:                               DISCHARGE SUMMARY   DISCHARGE DIAGNOSES:  1. Community acquired pneumonia.  2. Sepsis/hypotension.  3. Diabetes.   HISTORY OF PRESENT ILLNESS:  [name] is a 65 year old white  male, a patient of Dr. Romero Johnson who presented to the ER on March 15  with a 24 hour complaint of chills, rigors, fevers, and shortness of  breath.  He was admitted to the intensive care unit with community  acquired pneumonia, hypotension, and sepsis.   LABORATORY DATA:  Blood culture drawn March 15 was positive for  Streptococcus pneumoniae in 2 out of 2 blood cultures.  H1N1 screen from  March 16 was negative.  March 18 complete metabolic panel:  Sodium 139,  potassium 4.2, chloride 108, glucose 153, BUN 19, creatinine 1.21.  CBC  on March 18:  White blood cells 9.5, hemoglobin 10.3, hematocrit 29.7,  platelet count 119.  Urinalysis on March 17 showed urine was negative  for bilirubin and ketones, negative for nitrite, negative for leukocytes  and a urine culture on March 15 showed no growth.   HOSPITAL COURSE:  1. Community acquired pneumonia.  This has improved with IV      antibiotics and oxygen therapy.  Chest x-ray shows improvement of      left lung base consolidation.  The patient is currently requiring      minimal O2, does have some desaturations with ambulation.  Will      continue to ambulate the patient, monitor his O2 sat, and again      plan for discharge on March 20.  2. Sepsis/hypotension secondary to community acquired which resolved.      The patient's blood pressure is within normal limits on his home      doses of antihypertensives.  3. Diabetes.   This is controlled at this time again with his home      regimen of insulin and is to be followed up by his primary care      physician.   DISCHARGE MEDICATIONS:  Humalog 20 units subcu three times daily,  Humalog 50 units subcu one time daily, Prilosec 40 mg by mouth daily,  lisinopril 20 mg by mouth daily, Hyzaar 100/25 by mouth once daily,  Zetia 10 mg by mouth once daily, Crestor 40 mg by mouth once daily,  multivitamin one tab by mouth once daily, aspirin 81 mg by mouth once  daily, Viagra 100 mg by mouth as needed, diazepam 5 mg by mouth every  8  hours as needed for anxiety, Levaquin 750 mg by mouth daily for a total  of seven days, this should be stopped on March 25, this is a new  medication, Combivent inhaler four times daily as needed for shortness  of breath.   DIET:  The patient can be discharged on a regular diet.   ACTIVITY:  As tolerated.   He will follow up with Dr. Everardo Johnson, his primary care physician, as well  as follow up in the office with Ronald Johnson upon discharge.   He will be discharged in improved condition.  His community acquired  pneumonia and sepsis are both resolved.     ______________________________  Nelva Bush, NP      Nelda Bucks, MD  Electronically Signed    KW/MEDQ  D:  11/04/2008  T:  11/04/2008  Job:  [id number]   cc:   Ronald Signs A. Ronald All, MD

## 2011-01-04 NOTE — Assessment & Plan Note (Signed)
[facility]                            CARDIOLOGY OFFICE NOTE   NAME:LEWISBrandn, Mcgath                        MRN:          [id number]  DATE:11/13/2006                            DOB:          1946-07-04    Mr. Ingalsbe is a very pleasant gentleman who was recently admitted to  [facility]  with acute respiratory failure felt secondary to a  COPD exacerbation.  It was also felt that he may have had asthmatic  bronchitis with the flu.  He was treated with steroids, antibiotics, and  nebulizers.  During that admission, he did have cardiac markers  evaluated.  His initial CK was 2741 with a troponin I of 0.27.  His  troponin increased at 0.32 and then decreased to 0.16.  Note, he was not  having chest pain.  Also note, he had an echocardiogram performed on  October 15, 2006 that showed normal LV function.  There was mild LVH.  There was mild aortic root dilatation, and the left atrium was mildly  dilated.  Also note that the patient did have a Myoview performed in  January that showed an ejection fraction of 56%.  There appeared to be a  prior small inferior septal infarct but no ischemia.  Since he was  discharged, he has done very well.  He denies any dyspnea, chest pain,  palpitations, or syncope.   His medications include insulin, Metformin 1000 mg p.o. b.i.d., Prilosec  40 mg p.o. daily, lisinopril 20 mg p.o. daily, Hyzaar 100/25 daily,  Zetia 10 daily, Crestor 40 mg p.o. daily, multivitamin, and aspirin 81  mg p.o. daily.   PHYSICAL EXAMINATION:  VITAL SIGNS:  Blood pressure 140/80, and his  pulse is 84.  NECK:  Supple with no bruits.  CHEST:  Clear.  CARDIOVASCULAR:  Regular rate and rhythm.  EXTREMITIES:  No edema.   Electrocardiogram shows a sinus rhythm at a rate of 84.  There are no ST  changes noted.   DIAGNOSES:  1. Recent hospitalization with respiratory failure, now improved.  2. Mildly increased troponin during that  admission.  3. Diabetes mellitus for 15 years.  4. Hypertension.  5. Tobacco abuse, now resolved.  6. Probable chronic obstructive pulmonary disease.   PLAN:  Mr. Jafri is doing well since he was discharged.  We will  continue with his present medications.  I think that we most likely  should proceed with cardiac catheterization due to his multiple risk  factors, including 15 years of diabetes mellitus, question inferior  septal MI on his previous nuclear study, and recent elevated troponins.  We have discussed this, and he is agreeable.  The risks and benefits  have been discussed.  He would like to delay this for approximately four  weeks for scheduling issues.  Given his low risk nuclear study recently,  I  think this is reasonable.  We will have him return in approximately  eight weeks.  He will continue off of the tobacco and continue with risk  factor modification.     Madolyn Frieze Jens Som, MD, [facility]  Electronically  Signed    BSC/MedQ  DD: 11/13/2006  DT: 11/13/2006  Job #: [id number]

## 2011-01-04 NOTE — Discharge Summary (Signed)
Ronald Johnson               ACCOUNT NO.:  [id number]   MEDICAL RECORD NO.:  [id number]          PATIENT TYPE:  INP   LOCATION:  1430                         FACILITY:  [facility]   PHYSICIAN:  Ronald Rexene Edison. Swords, MD    DATE OF BIRTH:  1946/03/11   DATE OF ADMISSION:  10/14/2006  DATE OF DISCHARGE:  10/18/2006                               DISCHARGE SUMMARY   DISCHARGE DIAGNOSES:  1. Acute respiratory failure.  2. Chronic obstructive pulmonary disease exacerbation.  3. Type 2 diabetes.  4. Hypotension, resolved.  5. Elevated creatine kinase perhaps secondary to statin therapy.  6. Hypertension.  7. Tobacco abuse.  8. Right lower lobe nodules, needs repeat CAT scan.   HOSPITAL PROCEDURES:  CT of the chest to rule out pulmonary embolus was  negative for pulmonary embolus.  There were small right lower lobe  nodules and recommended followup in 3-6 months.   CONSULTS:  1. Pulmonary.  2. Cardiology.   DISCHARGE LABORATORY DATA:  CBC normal except for a hemoglobin of 12.0  on October 18, 2006.  Blood cultures negative to date.  Respiratory culture  significant for normal oropharyngeal flora.  Cardiac panel with elevated  CK, peaked at 2741, with a CK-MB of 8.6, troponin I 0.07, myoglobin  greater than 500.  Hemoglobin A1c 7.6%.  TSH 1.157.  BNP less than 30.   CONDITION ON DISCHARGE:  Improved.   FOLLOWUP PLANS:  Dr. Everardo All this week.   HOSPITAL COURSE:  PROBLEM # 1 - ACUTE RESPIRATORY FAILURE:  The patient  was admitted to the hospitalist service on October 14, 2006 with acute  respiratory failure, arterial blood gas with a pH of 7.37, pCO2 of 49.3,  pO2 of 77.9 on 4 L of oxygen.  The patient was thought to have acute  exacerbation of COPD with an element of asthmatic bronchitis.  The  patient was treated aggressively with steroids, antibiotics and  nebulizer.  The patient responded slowly and at the time of discharge  was able to ambulate without any difficulty without  oxygen.   PROBLEM #2 - ELEVATED CREATINE KINASE:  The patient was seen by  Cardiology; they are considering doing an outpatient coronary angiogram  after he completely recovers from this acute exacerbation of COPD.  The  patient underwent an echocardiogram which demonstrated EF of 60% to 65%,  no regional wall motion abnormalities identified.   PROBLEM #3 - TOBACCO ABUSE:  The patient understands the need to quit  and he states he will never smoke again.   PROBLEM #4 - HYPERTENSION, LIKELY SECONDARY TO ACUTE ILLNESS:  The  patient will resume his home antihypertensive medications.   PROBLEM #5 - HYPERLIPIDEMIA:  The patient will remain off Crestor, given  his elevated CK; that can be reevaluated as an outpatient.   DISCHARGE MEDICATIONS:  1. NPH insulin 60 units at night (the patient's home dose).  2. Humalog 20 units q.a.c.  3. Metformin 1 g b.i.d.  4. Prilosec 40 mg daily.  5. Lisinopril 20 mg daily.  6. Hyzaar 100/25 one daily.  7. Zetia 10 mg daily.  8.  Aspirin 81 mg daily.  9. Avelox 400 mg p.o. daily.  10.Prednisone 30 mg daily for 2 days, then 20 mg daily for 2 days,      then 10 mg daily for 2 days, then 5 mg daily for 2 days.  11.Albuterol MDI two puffs q.6 h. as needed.   DISCHARGE PLANNING:  Greater than 30 minutes were spent in discharge  planning.      Ronald Rexene Edison Swords, MD  Electronically Signed     BHS/MEDQ  D:  10/18/2006  T:  10/18/2006  Job:  [id number]

## 2011-01-31 ENCOUNTER — Other Ambulatory Visit: Payer: Self-pay | Admitting: Endocrinology

## 2011-01-31 ENCOUNTER — Other Ambulatory Visit: Payer: Managed Care, Other (non HMO)

## 2011-01-31 DIAGNOSIS — E109 Type 1 diabetes mellitus without complications: Secondary | ICD-10-CM

## 2011-02-01 ENCOUNTER — Ambulatory Visit: Payer: Managed Care, Other (non HMO) | Admitting: Pulmonary Disease

## 2011-02-07 ENCOUNTER — Ambulatory Visit: Payer: Managed Care, Other (non HMO) | Admitting: Endocrinology

## 2011-02-08 ENCOUNTER — Encounter: Payer: Self-pay | Admitting: Pulmonary Disease

## 2011-02-08 ENCOUNTER — Ambulatory Visit (INDEPENDENT_AMBULATORY_CARE_PROVIDER_SITE_OTHER): Payer: Managed Care, Other (non HMO) | Admitting: Pulmonary Disease

## 2011-02-08 VITALS — BP 130/76 | HR 103 | Temp 98.3°F | Ht 76.0 in | Wt 281.0 lb

## 2011-02-08 DIAGNOSIS — J984 Other disorders of lung: Secondary | ICD-10-CM

## 2011-02-08 DIAGNOSIS — J438 Other emphysema: Secondary | ICD-10-CM

## 2011-02-08 DIAGNOSIS — F17200 Nicotine dependence, unspecified, uncomplicated: Secondary | ICD-10-CM

## 2011-02-08 MED ORDER — ALBUTEROL SULFATE HFA 108 (90 BASE) MCG/ACT IN AERS: 2.0000 | INHALATION_SPRAY | Freq: Four times a day (QID) | RESPIRATORY_TRACT | Status: DC | PRN

## 2011-02-08 MED ORDER — ALBUTEROL SULFATE (2.5 MG/3ML) 0.083% IN NEBU: 2.5000 mg | INHALATION_SOLUTION | Freq: Four times a day (QID) | RESPIRATORY_TRACT | Status: DC | PRN

## 2011-02-08 NOTE — Patient Instructions (Signed)
Trial of symbicort 160 2 puffs twice daily, rinse mouth after use - call for Rx if this works Albuterol inhaler 2 puffs for rescue as needed Albuterol nebs as needed CT scan in 3-4 months

## 2011-02-08 NOTE — Progress Notes (Signed)
  Subjective:    Patient ID: Ronald Johnson, male    DOB: [birth year], 65 y.o.   MRN: [id number]  HPI 64/M, smoker for FU of COPD & pulm nodules.  Adm for LLL pneumococcal pneumonia 3/15- 11/07/08 (blood cx pos), hypotension responding to fluids.  CT chest march '10 >> LLL ASD, small effusion, small sub-cm RLL nodules.   04/07/09  Doing well off O2, Continues to smoke - a pack lasts a week, chantix 'is the best thing', vivid dreams , no sadness or depressive ideas.  Spirometry >>mild airway obstruction, paradoxical decrease in lung function with albuterol, 'passed out' during maneuver.  CT chest >> stable RLL nodules since 2/08, new 3 mm LLL nodule   June 18, 2010  Has quit smoking, remains on chantix !  No cough except when sinuses drain both ant & poteriorly, note on lisinopril 20   02/08/2011 3-4 cigs/ week Increased dyspnea & wheeze, saw ENT for sinuses - clear Quit taking metoprolol qvar & albuterol help, Planning to retire Denies cough, fevers, wheezing, weakness, edema.     Review of SystemsPt denies any significant  nasal congestion or excess secretions, fever, chills, sweats, unintended wt loss, pleuritic or exertional cp, orthopnea pnd or leg swelling.  Pt also denies any obvious fluctuation in symptoms with weather or environmental change or other alleviating or aggravating factors.    Pt denies any increase in rescue therapy over baseline, denies waking up needing it or having early am exacerbations or coughing/wheezing/ or dyspnea       Objective:   Physical Exam Gen. Pleasant, well-nourished, in no distress ENT - no lesions, no post nasal drip, class 2 airway Neck: No JVD, no thyromegaly, no carotid bruits Lungs: no use of accessory muscles, no dullness to percussion, clear without rales or rhonchi  Cardiovascular: Rhythm regular, heart sounds  normal, no murmurs or gallops, no peripheral edema Musculoskeletal: No deformities, no cyanosis or clubbing          Assessment & Plan:

## 2011-02-11 ENCOUNTER — Other Ambulatory Visit (INDEPENDENT_AMBULATORY_CARE_PROVIDER_SITE_OTHER): Payer: Managed Care, Other (non HMO)

## 2011-02-11 DIAGNOSIS — E109 Type 1 diabetes mellitus without complications: Secondary | ICD-10-CM

## 2011-02-11 LAB — HEMOGLOBIN A1C: Hgb A1c MFr Bld: 10.4 % — ABNORMAL HIGH (ref 4.6–6.5)

## 2011-02-11 NOTE — Assessment & Plan Note (Signed)
Encourage him to completely quit

## 2011-02-11 NOTE — Assessment & Plan Note (Signed)
Trial of symbicort 160 2 puffs twice daily, rinse mouth after use - call for Rx if this works Albuterol inhaler 2 puffs for rescue as needed Albuterol nebs as needed

## 2011-02-11 NOTE — Assessment & Plan Note (Signed)
Rpt CT in 4 mnths to document stability of nodules

## 2011-02-15 ENCOUNTER — Encounter: Payer: Self-pay | Admitting: Endocrinology

## 2011-02-15 ENCOUNTER — Ambulatory Visit (INDEPENDENT_AMBULATORY_CARE_PROVIDER_SITE_OTHER): Payer: Managed Care, Other (non HMO) | Admitting: Endocrinology

## 2011-02-15 ENCOUNTER — Encounter: Payer: Self-pay | Admitting: *Deleted

## 2011-02-15 VITALS — BP 122/72 | HR 105 | Temp 98.0°F | Ht 76.0 in | Wt 281.6 lb

## 2011-02-15 DIAGNOSIS — E109 Type 1 diabetes mellitus without complications: Secondary | ICD-10-CM

## 2011-02-15 MED ORDER — BECLOMETHASONE DIPROPIONATE 80 MCG/ACT IN AERS: 2.0000 | INHALATION_SPRAY | Freq: Two times a day (BID) | RESPIRATORY_TRACT | Status: DC

## 2011-02-15 MED ORDER — INSULIN LISPRO 100 UNIT/ML ~~LOC~~ SOLN: 25.0000 [IU] | Freq: Every day | SUBCUTANEOUS | Status: DC

## 2011-02-15 NOTE — Telephone Encounter (Signed)
A user error has taken place: encounter opened in error, closed for administrative reasons.

## 2011-02-15 NOTE — Patient Instructions (Addendum)
Reduce lantus to 300 units each morning. Add humalog 25 units daily, with the evening meal. check your blood sugar 2 times a day.  vary the time of day when you check, between before the 3 meals, and at bedtime.  also check if you have symptoms of your blood sugar being too high or too low.  please keep a record of the readings and bring it to your next appointment here.  please call us sooner if you are having low blood sugar episodes. good diet and exercise habits significanly improve the control of your diabetes.  please let me know if you wish to be referred to a dietician.  high blood sugar is very risky to your health.  you should see an eye doctor every year. controlling your blood pressure and cholesterol drastically reduces the damage diabetes does to your body.  this also applies to quitting smoking.  please discuss these with your doctor.  you should take an aspirin every day, unless you have been advised by a doctor not to.

## 2011-02-15 NOTE — Progress Notes (Signed)
Subjective:    Patient ID: Ronald Johnson, male    DOB: 17-Sep-1945, 65 y.o.   MRN: [id number]  HPI Pt says he is having hypoglycemia in the early hrs of the morning.  no cbg record, but states cbg's are highest at hs (mid-100's).  He will retire in 4 months.   Past Medical History  Diagnosis Date  . COLONIC POLYPS, ADENOMATOUS 08/01/2008  . DIABETES MELLITUS, TYPE I 03/13/2007  . HYPERCHOLESTEROLEMIA 02/03/2008  . ANEMIA 08/15/2009  . Anxiety state, unspecified 12/15/2008  . DEPRESSION 03/13/2007  . HYPERTENSION 03/13/2007  . PULMONARY NODULE 11/24/2008  . GERD 03/13/2007  . TOBACCO ABUSE 11/24/2008  . EMPHYSEMA 11/08/2008  . Prostatism   . ED (erectile dysfunction)     Past Surgical History  Procedure Date  . Egd 11/19/2005    History   Social History  . Marital Status: Married    Spouse Name: N/A    Number of Children: N/A  . Years of Education: N/A   Occupational History  . INSPECTOR     works Contractor   Social History Main Topics  . Smoking status: Current Some Day Smoker -- 1.0 packs/day for 35 years    Types: Cigarettes    Last Attempt to Quit: 10/31/2008  . Smokeless tobacco: Not on file   Comment: pt smokes 3-4 cigarettes weekly  . Alcohol Use: Yes     2/month  . Drug Use: Not on file  . Sexually Active: Not on file   Other Topics Concern  . Not on file   Social History Narrative   Married with children    Current Outpatient Prescriptions on File Prior to Visit  Medication Sig Dispense Refill  . albuterol (PROAIR HFA) 108 (90 BASE) MCG/ACT inhaler Inhale 2 puffs into the lungs every 6 (six) hours as needed.  1 Inhaler  1  . albuterol (PROVENTIL) (2.5 MG/3ML) 0.083% nebulizer solution Take 3 mLs (2.5 mg total) by nebulization every 6 (six) hours as needed for wheezing.  75 mL  1  . amLODipine (NORVASC) 2.5 MG tablet Take 2.5 mg by mouth daily.        Marland Kitchen aspirin 81 MG tablet Take 81 mg by mouth daily.        . diazepam (VALIUM) 5 MG tablet Take 5 mg  by mouth 2 (two) times daily as needed.        Marland Kitchen glucose blood (PRECISION XTRA TEST STRIPS) test strip Check blood sugar three times a day dx 250.01       . insulin glargine (LANTUS) 100 UNIT/ML injection Inject 300 Units into the skin every morning. Use as directed      . levocetirizine (XYZAL) 5 MG tablet Take 5 mg by mouth every evening.        Marland Kitchen losartan-hydrochlorothiazide (HYZAAR) 100-25 MG per tablet Take 1 tablet by mouth daily.        . metFORMIN (GLUCOPHAGE) 1000 MG tablet Take 1,000 mg by mouth 2 (two) times daily.        . Multiple Vitamin (MULTIVITAMIN PO) Take by mouth. Once daily       . Olopatadine HCl (PATANASE) 0.6 % SOLN 1-2 sprays in each nostril twice daily       . omeprazole (PRILOSEC) 20 MG capsule Take 20 mg by mouth daily.        . rosuvastatin (CRESTOR) 40 MG tablet Take 40 mg by mouth daily.        . sildenafil (VIAGRA) 100  MG tablet Take 100 mg by mouth daily as needed.        . metoprolol succinate (TOPROL-XL) 25 MG 24 hr tablet Take 25 mg by mouth daily.          No Known Allergies  Family History  Problem Relation Age of Onset  . Stroke Mother   . Cancer Neg Hx     BP 122/72  Pulse 105  Temp(Src) 98 F (36.7 C) (Oral)  Ht 6\' 4"  (1.93 m)  Wt 281 lb 9.6 oz (127.733 kg)  BMI 34.28 kg/m2  SpO2 90%  Review of Systems Denies loc    Objective:   Physical Exam Pulses: dorsalis pedis intact bilat.   Feet: no deformity.  no ulcer on the feet.  feet are of normal color and temp.  Trace bilat leg edema Neuro: sensation is intact to touch on the feet.    Lab Results  Component Value Date   HGBA1C 10.4* 02/11/2011      Assessment & Plan:  Dm.  This regimen does dot match his changing requirements throughout the day.  needs increased rx

## 2011-03-19 ENCOUNTER — Telehealth: Payer: Self-pay | Admitting: Pulmonary Disease

## 2011-03-19 MED ORDER — BUDESONIDE-FORMOTEROL FUMARATE 160-4.5 MCG/ACT IN AERO: 2.0000 | INHALATION_SPRAY | Freq: Two times a day (BID) | RESPIRATORY_TRACT | Status: DC

## 2011-03-19 NOTE — Telephone Encounter (Signed)
Per 02-08-11 OV;ok to send RX for symbicort if works well for patient. After speaking with pt's wife I am sending a 90 day supply to mail in pharmacy ([facility]) on file. Phone call complete.

## 2011-03-25 ENCOUNTER — Telehealth: Payer: Self-pay | Admitting: Pulmonary Disease

## 2011-03-25 MED ORDER — BUDESONIDE-FORMOTEROL FUMARATE 160-4.5 MCG/ACT IN AERO: 2.0000 | INHALATION_SPRAY | Freq: Two times a day (BID) | RESPIRATORY_TRACT | Status: DC

## 2011-03-25 NOTE — Telephone Encounter (Signed)
RX FOR SYMBICORT PRINTED FOR RA TO SIGN--RX FAXED TO [facility] [phone], ALSO SAMPLE LEFT OUT FRONT FOR P/U UNTIL MAIL ORDER COMES FROM [facility]--WIFE AWARE OF ALL OF THIS

## 2011-05-10 ENCOUNTER — Ambulatory Visit (INDEPENDENT_AMBULATORY_CARE_PROVIDER_SITE_OTHER): Payer: Managed Care, Other (non HMO) | Admitting: Endocrinology

## 2011-05-10 ENCOUNTER — Other Ambulatory Visit (INDEPENDENT_AMBULATORY_CARE_PROVIDER_SITE_OTHER): Payer: Managed Care, Other (non HMO)

## 2011-05-10 ENCOUNTER — Encounter: Payer: Self-pay | Admitting: Endocrinology

## 2011-05-10 VITALS — BP 132/68 | HR 100 | Temp 97.9°F | Ht 76.0 in | Wt 286.8 lb

## 2011-05-10 DIAGNOSIS — E109 Type 1 diabetes mellitus without complications: Secondary | ICD-10-CM

## 2011-05-10 LAB — HEMOGLOBIN A1C: Hgb A1c MFr Bld: 7.1 % — ABNORMAL HIGH (ref 4.6–6.5)

## 2011-05-10 MED ORDER — VARENICLINE TARTRATE 1 MG PO TABS: 1.0000 mg | ORAL_TABLET | Freq: Two times a day (BID) | ORAL | Status: DC

## 2011-05-10 MED ORDER — DIAZEPAM 5 MG PO TABS: 5.0000 mg | ORAL_TABLET | Freq: Two times a day (BID) | ORAL | Status: DC | PRN

## 2011-05-10 NOTE — Patient Instructions (Addendum)
blood tests are being requested for you today.  please call [phone] to hear your test results.  You will be prompted to enter the 9-digit "MRN" number that appears at the top left of this page, followed by #.  Then you will hear the message. pending the test results, please reduce lantus to 250 units each morning, and: Add humalog 40 units daily, with the evening meal. check your blood sugar 2 times a day.  vary the time of day when you check, between before the 3 meals, and at bedtime.  also check if you have symptoms of your blood sugar being too high or too low.  please keep a record of the readings and bring it to your next appointment here.  please call us sooner if you are having low blood sugar episodes.   Please make a regular physical appointment in 4 months.   i have sent a prescription to your pharmacy, for chantix.   Smoking is very dangerous.  It causes many health problems, including heart problems, cancer, emphysema, and death.  Please read the paper i am giving you today.  You can get help by calling 1-800-QUIT-NOW.  It is known that smokers who use quit-smoking medication are more successful quitting than those who don't.  i would be happy to prescribe you medication to help you quit.

## 2011-05-10 NOTE — Progress Notes (Signed)
Subjective:    Patient ID: Ronald Johnson, male    DOB: 1946-03-19, 65 y.o.   MRN: [id number]  HPI Pt says cbg's are still highest at hs (200's), and frequently low in the early hrs of the am.  pt states he feels well in general, except for doe.  He will see dr Vassie Loll in a few days.  He says his diet is limited by frequent business travel.  exercise is limited by doe.  He seldom smokes. He wants to quit altogether.  He took chantix successfully in the past, and he wants to take it again.   Past Medical History  Diagnosis Date  . COLONIC POLYPS, ADENOMATOUS 08/01/2008  . DIABETES MELLITUS, TYPE I 03/13/2007  . HYPERCHOLESTEROLEMIA 02/03/2008  . ANEMIA 08/15/2009  . Anxiety state, unspecified 12/15/2008  . DEPRESSION 03/13/2007  . HYPERTENSION 03/13/2007  . PULMONARY NODULE 11/24/2008  . GERD 03/13/2007  . TOBACCO ABUSE 11/24/2008  . EMPHYSEMA 11/08/2008  . Prostatism   . ED (erectile dysfunction)     Past Surgical History  Procedure Date  . Egd 11/19/2005    History   Social History  . Marital Status: Married    Spouse Name: N/A    Number of Children: N/A  . Years of Education: N/A   Occupational History  . INSPECTOR     works Contractor   Social History Main Topics  . Smoking status: Current Some Day Smoker -- 1.0 packs/day for 35 years    Types: Cigarettes    Last Attempt to Quit: 10/31/2008  . Smokeless tobacco: Not on file   Comment: pt smokes 3-4 cigarettes weekly  . Alcohol Use: Yes     2/month  . Drug Use: Not on file  . Sexually Active: Not on file   Other Topics Concern  . Not on file   Social History Narrative   Married with children    Current Outpatient Prescriptions on File Prior to Visit  Medication Sig Dispense Refill  . albuterol (PROVENTIL) (2.5 MG/3ML) 0.083% nebulizer solution Take 3 mLs (2.5 mg total) by nebulization every 6 (six) hours as needed for wheezing.  75 mL  1  . amLODipine (NORVASC) 2.5 MG tablet Take 2.5 mg by mouth daily.          Marland Kitchen aspirin 81 MG tablet Take 81 mg by mouth daily.        . budesonide-formoterol (SYMBICORT) 160-4.5 MCG/ACT inhaler Inhale 2 puffs into the lungs 2 (two) times daily. Rinse Mouth after use  3 Inhaler  3  . glucose blood (PRECISION XTRA TEST STRIPS) test strip Check blood sugar three times a day dx 250.01       . insulin glargine (LANTUS) 100 UNIT/ML injection Inject 250 Units into the skin every morning. Use as directed      . losartan-hydrochlorothiazide (HYZAAR) 100-25 MG per tablet Take 1 tablet by mouth daily.        . metFORMIN (GLUCOPHAGE) 1000 MG tablet Take 1,000 mg by mouth 2 (two) times daily.        . metoprolol succinate (TOPROL-XL) 25 MG 24 hr tablet Take 25 mg by mouth daily.        . Multiple Vitamin (MULTIVITAMIN PO) Take by mouth. Once daily       . omeprazole (PRILOSEC) 20 MG capsule Take 20 mg by mouth daily.        . rosuvastatin (CRESTOR) 40 MG tablet Take 40 mg by mouth daily.        Marland Kitchen  sildenafil (VIAGRA) 100 MG tablet Take 100 mg by mouth daily as needed.        Marland Kitchen albuterol (PROAIR HFA) 108 (90 BASE) MCG/ACT inhaler Inhale 2 puffs into the lungs every 6 (six) hours as needed.  1 Inhaler  1  . beclomethasone (QVAR) 80 MCG/ACT inhaler Inhale 2 puffs into the lungs 2 (two) times daily.  3 Inhaler  3  . Olopatadine HCl (PATANASE) 0.6 % SOLN 1-2 sprays in each nostril twice daily         No Known Allergies  Family History  Problem Relation Age of Onset  . Stroke Mother   . Cancer Neg Hx    BP 132/68  Pulse 100  Temp(Src) 97.9 F (36.6 C) (Oral)  Ht 6\' 4"  (1.93 m)  Wt 286 lb 12.8 oz (130.092 kg)  BMI 34.91 kg/m2  SpO2 86%  Review of Systems Denies loc.      Objective:   Physical Exam VITAL SIGNS:  See vs page GENERAL: no distress LUNGS:  Clear to auscultation SKIN:  Insulin injection sites at the anterior abdomen are normal  Lab Results  Component Value Date   HGBA1C 7.1* 05/10/2011      Assessment & Plan:  Dm, Needs increased rx, if it can be done  with a regimen that avoids or minimizes hypoglycemia.

## 2011-05-14 ENCOUNTER — Encounter: Payer: Self-pay | Admitting: Pulmonary Disease

## 2011-05-14 ENCOUNTER — Ambulatory Visit (INDEPENDENT_AMBULATORY_CARE_PROVIDER_SITE_OTHER): Payer: Managed Care, Other (non HMO) | Admitting: Pulmonary Disease

## 2011-05-14 VITALS — BP 150/74 | HR 97 | Temp 98.4°F | Ht 76.0 in | Wt 287.0 lb

## 2011-05-14 DIAGNOSIS — Z23 Encounter for immunization: Secondary | ICD-10-CM

## 2011-05-14 DIAGNOSIS — J438 Other emphysema: Secondary | ICD-10-CM

## 2011-05-14 DIAGNOSIS — F17200 Nicotine dependence, unspecified, uncomplicated: Secondary | ICD-10-CM

## 2011-05-14 NOTE — Progress Notes (Signed)
  Subjective:    Patient ID: Ronald Johnson, male    DOB: [birth year], 65 y.o.   MRN: [id number]  HPI 65/M, smoker for FU of COPD & pulm nodules.  Adm for LLL pneumococcal pneumonia 3/15- 11/07/08 (blood cx pos), hypotension responding to fluids.  CT chest march '10 >> LLL ASD, small effusion, small sub-cm RLL nodules.  04/07/09  Doing well off O2, Continues to smoke - a pack lasts a week, chantix 'is the best thing', vivid dreams , no sadness or depressive ideas.  Spirometry >>mild airway obstruction, paradoxical decrease in lung function with albuterol, 'passed out' during maneuver.  CT chest >> stable RLL nodules since 2/08, new 3 mm LLL nodule   June 18, 2010  Has quit smoking, remains on chantix !  No cough except when sinuses drain both ant & poteriorly, note on lisinopril 20   02/08/2011  Increased dyspnea & wheeze, saw ENT for sinuses - clear  Quit taking metoprolol  qvar & albuterol help, Planning to retire   05/14/2011  Pt c/o increased SOB and requesting to start Pulmonary rehab to be able to do daily activities and current job. He will need FMLA to enroll in pulm rehab - reviewed papers & current job requirements. Taking lisinopril & losartan 3-4 cigs/ week , wt unchanged CT c hest nov '11 reviewed Denies cough, fevers, wheezing, weakness, edema.     Review of Systems Patient denies significant cough, hemoptysis,  chest pain, palpitations, pedal edema, orthopnea, paroxysmal nocturnal dyspnea, lightheadedness, nausea, vomiting, abdominal or  leg pains      Objective:   Physical Exam  Gen. Pleasant, obese, in no distress, normal affect ENT - no lesions, no post nasal drip, class 2-3 airway Neck: No JVD, no thyromegaly, no carotid bruits Lungs: no use of accessory muscles, no dullness to percussion, decreased without rales or rhonchi  Cardiovascular: Rhythm regular, heart sounds  normal, no murmurs or gallops, no peripheral edema Abdomen: soft and non-tender, no  hepatosplenomegaly, BS normal. Musculoskeletal: No deformities, no cyanosis or clubbing Neuro:  alert, non focal, no tremors       Assessment & Plan:

## 2011-05-14 NOTE — Patient Instructions (Addendum)
Breathing test  Enroll in pulmonary rehab program FMLA papers will be filled out - collect from [facility] office by tomorrow Take albuterol inhaler for rescue

## 2011-05-15 ENCOUNTER — Telehealth: Payer: Self-pay | Admitting: *Deleted

## 2011-05-15 DIAGNOSIS — E119 Type 2 diabetes mellitus without complications: Secondary | ICD-10-CM

## 2011-05-15 DIAGNOSIS — Z0000 Encounter for general adult medical examination without abnormal findings: Secondary | ICD-10-CM

## 2011-05-15 DIAGNOSIS — Z0389 Encounter for observation for other suspected diseases and conditions ruled out: Secondary | ICD-10-CM

## 2011-05-15 NOTE — Telephone Encounter (Signed)
Labs for upcoming CPX appointment placed into Epic 

## 2011-05-16 ENCOUNTER — Encounter: Payer: Self-pay | Admitting: Pulmonary Disease

## 2011-05-16 NOTE — Assessment & Plan Note (Signed)
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

## 2011-05-16 NOTE — Assessment & Plan Note (Signed)
Focus on cessation - hoepfully rehab will also help to motivate him about this

## 2011-05-17 ENCOUNTER — Ambulatory Visit: Payer: Managed Care, Other (non HMO) | Admitting: Endocrinology

## 2011-05-21 ENCOUNTER — Ambulatory Visit (INDEPENDENT_AMBULATORY_CARE_PROVIDER_SITE_OTHER): Payer: Managed Care, Other (non HMO) | Admitting: Pulmonary Disease

## 2011-05-21 DIAGNOSIS — J438 Other emphysema: Secondary | ICD-10-CM

## 2011-05-21 LAB — PULMONARY FUNCTION TEST

## 2011-05-21 NOTE — Progress Notes (Signed)
  Subjective:    Patient ID: Ronald Johnson, male    DOB: [birth year], 65 y.o.   MRN: [id number]  HPI    Review of Systems     Objective:   Physical Exam        Assessment & Plan:  Tobacco cessation advice x 3 minutes

## 2011-05-21 NOTE — Progress Notes (Signed)
PFT done today. 

## 2011-05-22 ENCOUNTER — Telehealth: Payer: Self-pay | Admitting: Pulmonary Disease

## 2011-05-22 NOTE — Telephone Encounter (Signed)
His lung function seems to have dropped from 2 yrs ago from 2.4 L to 1.6 L Stopping smoking should help He can try spiriva again if interested. Proceed with Pulm rehab

## 2011-05-24 NOTE — Telephone Encounter (Signed)
[email or web address] x1 

## 2011-05-27 NOTE — Telephone Encounter (Signed)
Please call back at [phone]. Pt returning call [place]

## 2011-05-27 NOTE — Telephone Encounter (Signed)
lmomtcb  

## 2011-05-27 NOTE — Telephone Encounter (Signed)
Pt is calling back & can still be reached at [phone].  Ronald Johnson

## 2011-05-28 NOTE — Telephone Encounter (Signed)
PATIENT RETURNED CALL PLEASE CALL BACK

## 2011-05-28 NOTE — Telephone Encounter (Signed)
I spoke with patient about results and he verbalized understanding and had no questions.  Pt states he has not heard anything from Pulmonary Rehab. In the order it does not state which one order was faxed to so I can call to see what the hold up is. Please advise PCC. Pt would like a call back regarding this and he states it's okay to leave a detailed message on VM. Thanks   Carver Fila, CMA

## 2011-05-28 NOTE — Telephone Encounter (Signed)
Spoke to pt is is now aware that [facility] will call him when ready and it may be 4-5 weeks

## 2011-05-29 ENCOUNTER — Other Ambulatory Visit: Payer: Self-pay | Admitting: *Deleted

## 2011-05-29 MED ORDER — DIAZEPAM 5 MG PO TABS: 5.0000 mg | ORAL_TABLET | Freq: Two times a day (BID) | ORAL | Status: DC | PRN

## 2011-05-29 MED ORDER — VARENICLINE TARTRATE 1 MG PO TABS: 1.0000 mg | ORAL_TABLET | Freq: Two times a day (BID) | ORAL | Status: AC

## 2011-05-29 NOTE — Telephone Encounter (Signed)
Pt left message stating that he has not received rx's from [facility] for Valium and Chantix (Rx's faxed at 05/10/2011 OV). [facility] to verify if they have received rx's and they have not. Okay to print and resend rx for Valium and Chantix?

## 2011-05-29 NOTE — Telephone Encounter (Signed)
Rx for Chantix and Valium printed-awaiting MD's signature

## 2011-05-29 NOTE — Telephone Encounter (Signed)
ok 

## 2011-05-31 NOTE — Telephone Encounter (Signed)
Rx faxed to pharmacy  

## 2011-06-05 ENCOUNTER — Ambulatory Visit (INDEPENDENT_AMBULATORY_CARE_PROVIDER_SITE_OTHER)
Admission: RE | Admit: 2011-06-05 | Discharge: 2011-06-05 | Disposition: A | Discharge status: Home / Self Care | Payer: Managed Care, Other (non HMO) | Source: Ambulatory Visit | Attending: Pulmonary Disease | Admitting: Pulmonary Disease

## 2011-06-05 DIAGNOSIS — J984 Other disorders of lung: Secondary | ICD-10-CM

## 2011-06-06 NOTE — Progress Notes (Signed)
Quick Note:  I informed pt of RA's findings and recommendations. Pt verbalized understanding  ______ 

## 2011-06-13 ENCOUNTER — Encounter (HOSPITAL_COMMUNITY)
Admission: RE | Admit: 2011-06-13 | Discharge: 2011-06-13 | Disposition: A | Discharge status: Home / Self Care | Payer: Managed Care, Other (non HMO) | Source: Ambulatory Visit | Attending: Pulmonary Disease | Admitting: Pulmonary Disease

## 2011-06-13 DIAGNOSIS — J984 Other disorders of lung: Secondary | ICD-10-CM | POA: Insufficient documentation

## 2011-06-13 DIAGNOSIS — Z5189 Encounter for other specified aftercare: Secondary | ICD-10-CM | POA: Insufficient documentation

## 2011-06-13 DIAGNOSIS — J438 Other emphysema: Secondary | ICD-10-CM | POA: Insufficient documentation

## 2011-06-18 ENCOUNTER — Other Ambulatory Visit: Payer: Self-pay

## 2011-06-18 ENCOUNTER — Encounter (HOSPITAL_COMMUNITY): Payer: Managed Care, Other (non HMO)

## 2011-06-18 MED ORDER — LOSARTAN POTASSIUM-HCTZ 100-25 MG PO TABS: 1.0000 | ORAL_TABLET | Freq: Every day | ORAL | Status: DC

## 2011-06-18 MED ORDER — ROSUVASTATIN CALCIUM 40 MG PO TABS: 40.0000 mg | ORAL_TABLET | Freq: Every day | ORAL | Status: DC

## 2011-06-20 ENCOUNTER — Encounter (HOSPITAL_COMMUNITY): Payer: Managed Care, Other (non HMO)

## 2011-06-20 DIAGNOSIS — J438 Other emphysema: Secondary | ICD-10-CM | POA: Insufficient documentation

## 2011-06-20 DIAGNOSIS — J984 Other disorders of lung: Secondary | ICD-10-CM | POA: Insufficient documentation

## 2011-06-20 DIAGNOSIS — Z5189 Encounter for other specified aftercare: Secondary | ICD-10-CM | POA: Insufficient documentation

## 2011-06-25 ENCOUNTER — Encounter (HOSPITAL_COMMUNITY): Payer: Managed Care, Other (non HMO)

## 2011-06-25 NOTE — Progress Notes (Addendum)
Patient complains of calf tightness while ambulating. I advised him to stretch more and drink plenty of fluids. I explained to the patient that he may be overdoing it by walking almost 2 miles a day including days he has rehab sessions. Patient voiced understanding and states "he will not walk on rehab days".   Ronald Johnson today voiced concern because his weight is not dropping.  Reviewed his current diet plan and weight loss.  He is compliant except for Saturdays.  Reviewed low sodium choices.  Patient voiced understanding.

## 2011-06-27 ENCOUNTER — Encounter (HOSPITAL_COMMUNITY): Payer: Managed Care, Other (non HMO)

## 2011-06-27 NOTE — Progress Notes (Signed)
Completed home exercise with patient. Discussed exercise progression, type of exercises patient can do at home safely, his routine, RPE/Dyspnea scales, the importance of having a home pulse oximeter and how to use one, environmental factors to watch for when exercising, warning signs and symptoms, when to call MD and CP. Patient set a goal to run again without having to use supplemental oxygen. The patient is on RA and plans to build his lung muscles up to be able to at least do it once. Patient voices understanding to Home Exercise Regimen. Will continue to encourage and support.

## 2011-07-02 ENCOUNTER — Encounter (HOSPITAL_COMMUNITY): Payer: Managed Care, Other (non HMO)

## 2011-07-02 NOTE — Progress Notes (Signed)
During exercise today, Mr Ronald Johnson states he began to feel different, weak , shaky.  CBG done by patient, 53/  He took 2 glucose tabs (his)   After 15 min, repeat CBG was 67,  Patient given lemonade and peanut butter cracker.  After another 15 min CBG then @ 90, he was feeling much improved.  He was discharged to go straight home for lunch.

## 2011-07-02 NOTE — Progress Notes (Signed)
Pulmonary Rehab Nutrition Screen  Ronald Johnson 65 y.o. male with COPD            HT: 74" Ht Readings from Last 1 Encounters:  05/14/11 6\' 4"  (1.93 m)  WT: 282.5 lb (128.4 kg) Wt Readings from Last 3 Encounters:  05/14/11 287 lb (130.182 kg)  05/10/11 286 lb 12.8 oz (130.092 kg)  02/15/11 281 lb 9.6 oz (127.733 kg)  BMI 36.3 %body fat 38.0       Rate Your Plate Score: 61  Please answer the following questions:            YES  NO    Do you live in a nursing home?  X   Do you eat out more than 3 times per week?    X If yes, how many times per week do you eat out?   Do you have food allergies?   X If yes, what are you allergic to?  Have you gained or lost more than 10 lbs without trying?               X If yes, how much weight have you  lost or gained?  lbs over  weeks/mo   Do you want to lose weight?    X  If yes, what is a goal weight or amount of weight you would like to lose? 80 lb  Do you eat alone most of the time?   X   Do you eat less than 2 meals/day?  X If yes, how many meals do you eat?  Do you use canned and convenience food?  X   Do you use a salt shaker?  X   Do you drink more than 3 alcoholic drinks/day?  X If yes, how many drinks per day?  Are you having trouble with constipation? *  X If yes, what are you doing to help relieve constipation?  Do you have financial difficulties with buying food? *  X   Do you usually need help with grocery shopping or with cooking? *  X   Do you have a poor appetite? *                                       X   Do you have trouble chewing/ swallowing? *   X   Do you take vitamin and mineral or herbal supplements? * X  If yes, what kind of supplements do you currently take? MVI

## 2011-07-02 NOTE — Progress Notes (Signed)
Nutrition Assessment Janyth Contes  Time Spent: 30 min   65 y.o. male with COPD Past Medical History  Diagnosis Date  . COLONIC POLYPS, ADENOMATOUS 08/01/2008  . DIABETES MELLITUS, TYPE I 03/13/2007  . HYPERCHOLESTEROLEMIA 02/03/2008  . ANEMIA 08/15/2009  . Anxiety state, unspecified 12/15/2008  . DEPRESSION 03/13/2007  . HYPERTENSION 03/13/2007  . PULMONARY NODULE 11/24/2008  . GERD 03/13/2007  . TOBACCO ABUSE 11/24/2008  . EMPHYSEMA 11/08/2008  . Prostatism   . ED (erectile dysfunction)    Current tobacco use?  No per most recent RN assessment.  Pt recently quit tobacco use.    has a current medication list which includes the following prescription(s): albuterol, albuterol, amlodipine, aspirin, beclomethasone, budesonide-formoterol, diazepam, glucose blood, insulin glargine, insulin lispro, losartan-hydrochlorothiazide, metformin, metoprolol succinate, multiple vitamin, olopatadine hcl, omeprazole, rosuvastatin, sildenafil, and varenicline.   Lab Results  Component Value Date   HGBA1C 7.1* 05/10/2011       HGBA1C  10.4      02/11/11      HGBA1C  10.0      10/22/10      HGBA1C    8.9      03/15/10  HT 74" Ht Readings from Last 1 Encounters:  05/14/11 6\' 4"  (1.93 m)  WT 282.5 lbs( 128.4 kg) Wt Readings from Last 3 Encounters:  05/14/11 287 lb (130.182 kg)  05/10/11 286 lb 12.8 oz (130.092 kg)  02/15/11 281 lb 9.6 oz (127.733 kg)  IBW: 86.4 kg   149%IBW BMI 38.0   %body fat 38.0 Assessment: Spoke with pt.  Pt reports he has recently become a vegetarian as part of changes he is making to live a healthy lifestyle.  Pt eats fish.  Pt is obese.  Pt's current wt is down 4.5 lbs over the past 6 weeks.  Pt states he has lost 11# over the past 4-5 weeks.  According to pt, he is walking 2 miles 2 times daily.  Pt eats 3 meals plus 2 snacks daily. Most meals prepared at home.  Making healthy food choices the majority of the time.  Pt's Rate Your Plate results reviewed with pt.  Pt expressed  understanding.  Pt avoids salty food; does not use canned/ convenience food.  Pt does not add salt and salt-containing seasonings to food.  The role of sodium in lung disease reviewed with pt.  Pt is diabetic.  Last a1c is significantly decreased from A1c over the past 3 mo, 6 mo, and 10 mo. Pt states his A1c is decreasing because his medication was changed to Lantus and Humalog.  Pt checks CBG's 3-4 times a day.  Fasting CBG's reportedly 90 - 100mg /dL before meals.  Pt able to recall approximate recommended CBG ranges before meals.  Recommended CBG ranges reviewed with pt.    Nutrition Diagnosis   Food-and nutrition-related knowledge deficit related to lack of exposure to information as related to diagnosis of pulmonary disease   Obesity related to excessive energy intake as evidenced by a BMI of 36.3  Nutrition Rx/Est. Daily Nutrition Needs for: ? wt loss 2000-2500 Kcal  105-125 gm protein   Less than 1500 mg sodium,     250-325 gm CHO Nutrition Intervention   Pt's individual nutrition plan and goals reviewed with pt.   Benefits of adopting healthy eating habits discussed when pt's Rate Your Plate reviewed.   Pt to attend the Nutrition and Lung Disease class   Continual client-centered nutrition education by RD, as part of interdisciplinary care. Goal(s)  1. Identify food quantities necessary to achieve wt loss of  -2# per week to a goal wt of 258-276 lb     (117.3-125.5 kg) at graduation from pulmonary rehab. 2. Use pre-/post meal and/or exercise CBG's and A1c to determine whether adjustments in food/meal planning will be beneficial or if any meds need to be combined with nutrition therapy. Monitor and Evaluate progress toward nutrition goal with team.

## 2011-07-04 ENCOUNTER — Encounter (HOSPITAL_COMMUNITY)
Admission: RE | Admit: 2011-07-04 | Discharge: 2011-07-04 | Disposition: A | Discharge status: Home / Self Care | Payer: Managed Care, Other (non HMO) | Source: Ambulatory Visit | Attending: Pulmonary Disease | Admitting: Pulmonary Disease

## 2011-07-09 ENCOUNTER — Encounter (HOSPITAL_COMMUNITY)
Admission: RE | Admit: 2011-07-09 | Discharge: 2011-07-09 | Disposition: A | Discharge status: Home / Self Care | Payer: Managed Care, Other (non HMO) | Source: Ambulatory Visit | Attending: Pulmonary Disease | Admitting: Pulmonary Disease

## 2011-07-09 ENCOUNTER — Ambulatory Visit (INDEPENDENT_AMBULATORY_CARE_PROVIDER_SITE_OTHER): Payer: Managed Care, Other (non HMO) | Admitting: Pulmonary Disease

## 2011-07-09 ENCOUNTER — Encounter: Payer: Self-pay | Admitting: Pulmonary Disease

## 2011-07-09 DIAGNOSIS — J438 Other emphysema: Secondary | ICD-10-CM

## 2011-07-09 NOTE — Assessment & Plan Note (Signed)
Complete pulm rehab OK to rpt spirometry after - pre/post 4 more weeks of FMLA requested - he plans to retire after. Vaccines uptodate.

## 2011-07-09 NOTE — Progress Notes (Signed)
  Subjective:    Patient ID: Ronald Johnson, male    DOB: 1946/07/30, 65 y.o.   MRN: [id number]  HPI 65/M, ex -smoker for FU of COPD & pulm nodules.  Adm for LLL pneumococcal pneumonia 3/15- 11/07/08 (blood cx pos)  CT chest march '10 >> LLL ASD, small effusion, small sub-cm RLL nodules.   04/07/09  Spirometry >>mild airway obstruction, paradoxical decrease in lung function with albuterol, 'passed out' during maneuver.  CT chest >> stable RLL nodules since 2/08, new 3 mm LLL nodule   07/09/2011  Enrolled in Pulmonary rehab with FMLA Has been able to quit smoking since 10/12 Excited about lifestyle change , hoping to lose wt. Has stopped symbicort & qvar Denies cough, fevers, wheezing, weakness, edema.       Review of Systems Patient denies significant dyspnea,cough, hemoptysis,  chest pain, palpitations, pedal edema, orthopnea, paroxysmal nocturnal dyspnea, lightheadedness, nausea, vomiting, abdominal or  leg pains      Objective:   Physical Exam Gen. Pleasant, obese, in no distress ENT - no lesions, no post nasal drip Neck: No JVD, no thyromegaly, no carotid bruits Lungs: no use of accessory muscles, no dullness to percussion, decreased without rales or rhonchi  Cardiovascular: Rhythm regular, heart sounds  normal, no murmurs or gallops, no peripheral edema Musculoskeletal: No deformities, no cyanosis or clubbing , no tremors        Assessment & Plan:

## 2011-07-09 NOTE — Patient Instructions (Signed)
Congratulations on your lifestyle changes FMLA papers will be filled out by Friday

## 2011-07-11 ENCOUNTER — Encounter (HOSPITAL_COMMUNITY): Payer: Managed Care, Other (non HMO)

## 2011-07-16 ENCOUNTER — Encounter (HOSPITAL_COMMUNITY)
Admission: RE | Admit: 2011-07-16 | Discharge: 2011-07-16 | Disposition: A | Discharge status: Home / Self Care | Payer: Managed Care, Other (non HMO) | Source: Ambulatory Visit | Attending: Pulmonary Disease | Admitting: Pulmonary Disease

## 2011-07-18 ENCOUNTER — Encounter (HOSPITAL_COMMUNITY)
Admission: RE | Admit: 2011-07-18 | Discharge: 2011-07-18 | Disposition: A | Discharge status: Home / Self Care | Payer: Managed Care, Other (non HMO) | Source: Ambulatory Visit | Attending: Pulmonary Disease | Admitting: Pulmonary Disease

## 2011-07-23 ENCOUNTER — Encounter (HOSPITAL_COMMUNITY)
Admission: RE | Admit: 2011-07-23 | Discharge: 2011-07-23 | Disposition: A | Discharge status: Home / Self Care | Payer: Managed Care, Other (non HMO) | Source: Ambulatory Visit | Attending: Pulmonary Disease | Admitting: Pulmonary Disease

## 2011-07-23 DIAGNOSIS — Z5189 Encounter for other specified aftercare: Secondary | ICD-10-CM | POA: Insufficient documentation

## 2011-07-23 DIAGNOSIS — J438 Other emphysema: Secondary | ICD-10-CM | POA: Insufficient documentation

## 2011-07-23 DIAGNOSIS — J984 Other disorders of lung: Secondary | ICD-10-CM | POA: Insufficient documentation

## 2011-07-23 NOTE — Progress Notes (Signed)
Time Spent: 15 minutes Brief Consult Per discussion with [employer], pt concerned that he is not losing wt fast enough.  Pt is interested in "getting off of insulin."  Spoke with pt.  Pt has lost 13 lb over the past 2 months, which pt was informed by this writer is appropriate and appears safe.  Pt discouraged from losing more than 2 lb/week. According to pt, his highest wt was 287 lb.  Pt c/o feeling hungry "all the time" while following a 1500 kcal diet.  Per discussion with Cathie Olden, RN pt eats anything and everything he wants on the weekend.  Pt states his insulin need has decreased with his wt loss.  Pt concerned re: cost of Lantus being almost $500 a month.  Pt states he is not using his Novolog very much because his CBG's have been "good."  Pt concerned re: cost of insulin due to pt retiring 08/22/11 and "won't be able to afford Lantus" at that point in time.  Will notify Dr. Everardo All re: pt concern.  Pt's last A1c 7.1 in 04/2011, which indicates blood glucose fairly well controlled. Continue client-centered nutrition education by RD as part of interdisciplinary care.  Monitor and evaluate progress toward nutrition goal with team.

## 2011-07-25 ENCOUNTER — Encounter (HOSPITAL_COMMUNITY)
Admission: RE | Admit: 2011-07-25 | Discharge: 2011-07-25 | Disposition: A | Discharge status: Home / Self Care | Payer: Managed Care, Other (non HMO) | Source: Ambulatory Visit | Attending: Pulmonary Disease | Admitting: Pulmonary Disease

## 2011-07-25 ENCOUNTER — Other Ambulatory Visit (INDEPENDENT_AMBULATORY_CARE_PROVIDER_SITE_OTHER): Payer: Managed Care, Other (non HMO)

## 2011-07-25 DIAGNOSIS — Z0389 Encounter for observation for other suspected diseases and conditions ruled out: Secondary | ICD-10-CM

## 2011-07-25 DIAGNOSIS — E119 Type 2 diabetes mellitus without complications: Secondary | ICD-10-CM

## 2011-07-25 DIAGNOSIS — Z0000 Encounter for general adult medical examination without abnormal findings: Secondary | ICD-10-CM

## 2011-07-25 LAB — CBC WITH DIFFERENTIAL/PLATELET
Basophils Absolute: 0 10*3/uL (ref 0.0–0.1)
HCT: 35.8 % — ABNORMAL LOW (ref 39.0–52.0)
Lymphs Abs: 2.2 10*3/uL (ref 0.7–4.0)
MCV: 95.3 fl (ref 78.0–100.0)
Monocytes Absolute: 0.4 10*3/uL (ref 0.1–1.0)
Platelets: 167 10*3/uL (ref 150.0–400.0)
RDW: 12.5 % (ref 11.5–14.6)

## 2011-07-25 LAB — BASIC METABOLIC PANEL
CO2: 27 mEq/L (ref 19–32)
Chloride: 102 mEq/L (ref 96–112)
Glucose, Bld: 98 mg/dL (ref 70–99)
Potassium: 4.1 mEq/L (ref 3.5–5.1)
Sodium: 138 mEq/L (ref 135–145)

## 2011-07-25 LAB — URINALYSIS, ROUTINE W REFLEX MICROSCOPIC
Ketones, ur: NEGATIVE
Leukocytes, UA: NEGATIVE
Specific Gravity, Urine: 1.01 (ref 1.000–1.030)
Urine Glucose: NEGATIVE
pH: 6.5 (ref 5.0–8.0)

## 2011-07-25 LAB — HEPATIC FUNCTION PANEL: Total Bilirubin: 0.7 mg/dL (ref 0.3–1.2)

## 2011-07-25 LAB — TSH: TSH: 1.96 u[IU]/mL (ref 0.35–5.50)

## 2011-07-25 LAB — LIPID PANEL
HDL: 29.5 mg/dL — ABNORMAL LOW (ref >39.00)
Total CHOL/HDL Ratio: 3
VLDL: 30.4 mg/dL (ref 0.0–40.0)

## 2011-07-25 LAB — MICROALBUMIN / CREATININE URINE RATIO: Microalb Creat Ratio: 21.7 mg/g (ref 0.0–30.0)

## 2011-07-26 LAB — LDL CHOLESTEROL, DIRECT: Direct LDL: 39.4 mg/dL

## 2011-07-30 ENCOUNTER — Encounter (HOSPITAL_COMMUNITY)
Admission: RE | Admit: 2011-07-30 | Discharge: 2011-07-30 | Disposition: A | Discharge status: Home / Self Care | Payer: Managed Care, Other (non HMO) | Source: Ambulatory Visit | Attending: Pulmonary Disease | Admitting: Pulmonary Disease

## 2011-08-01 ENCOUNTER — Encounter: Payer: Managed Care, Other (non HMO) | Admitting: Endocrinology

## 2011-08-01 ENCOUNTER — Encounter (HOSPITAL_COMMUNITY): Payer: Managed Care, Other (non HMO)

## 2011-08-02 ENCOUNTER — Encounter: Payer: Self-pay | Admitting: Endocrinology

## 2011-08-02 ENCOUNTER — Ambulatory Visit (INDEPENDENT_AMBULATORY_CARE_PROVIDER_SITE_OTHER): Payer: Managed Care, Other (non HMO) | Admitting: Endocrinology

## 2011-08-02 DIAGNOSIS — E109 Type 1 diabetes mellitus without complications: Secondary | ICD-10-CM

## 2011-08-02 DIAGNOSIS — Z2911 Encounter for prophylactic immunotherapy for respiratory syncytial virus (RSV): Secondary | ICD-10-CM

## 2011-08-02 DIAGNOSIS — E7800 Pure hypercholesterolemia, unspecified: Secondary | ICD-10-CM

## 2011-08-02 DIAGNOSIS — Z23 Encounter for immunization: Secondary | ICD-10-CM

## 2011-08-02 MED ORDER — METFORMIN HCL 1000 MG PO TABS: 1000.0000 mg | ORAL_TABLET | Freq: Two times a day (BID) | ORAL | Status: DC

## 2011-08-02 MED ORDER — LOSARTAN POTASSIUM-HCTZ 100-25 MG PO TABS: 1.0000 | ORAL_TABLET | Freq: Every day | ORAL | Status: DC

## 2011-08-02 MED ORDER — AMLODIPINE BESYLATE 2.5 MG PO TABS: 2.5000 mg | ORAL_TABLET | Freq: Every day | ORAL | Status: DC

## 2011-08-02 MED ORDER — ATORVASTATIN CALCIUM 80 MG PO TABS: 80.0000 mg | ORAL_TABLET | Freq: Every day | ORAL | Status: DC

## 2011-08-02 MED ORDER — GLUCOSE BLOOD VI STRP: ORAL_STRIP | Status: DC

## 2011-08-02 NOTE — Patient Instructions (Addendum)
reduce lantus to 100 units daily.  On days when you are going to be active, take just 50 units.   Stop metoprolol. Change crestor to generic lipitor, 10 mg daily.   please consider these measures for your health:  minimize alcohol.  do not use tobacco products.  have a colonoscopy at least every 10 years from age 65.   keep firearms safely stored.  always use seat belts.  have working smoke alarms in your home.  see an eye doctor and dentist regularly.  never drive under the influence of alcohol or drugs (including prescription drugs).  those with fair skin should take precautions against the sun. please let me know what your wishes would be, if artificial life support measures should become necessary.  it is critically important to prevent falling down (keep floor areas well-lit, dry, and free of loose objects.  If you have a cane, walker, or wheelchair, you should use it, even for short trips around the house.  Also, try not to rush).   Please come back for a follow-up appointment in 3 months.

## 2011-08-02 NOTE — Progress Notes (Signed)
Subjective:    Patient ID: Ronald Johnson, male    DOB: 05/27/1946, 65 y.o.   MRN: [id number]  HPI here for regular wellness examination.  He's feeling pretty well in general, and says chronic med probs are stable, except as noted below Past Medical History  Diagnosis Date  . COLONIC POLYPS, ADENOMATOUS 08/01/2008  . DIABETES MELLITUS, TYPE I 03/13/2007  . HYPERCHOLESTEROLEMIA 02/03/2008  . ANEMIA 08/15/2009  . Anxiety state, unspecified 12/15/2008  . DEPRESSION 03/13/2007  . HYPERTENSION 03/13/2007  . PULMONARY NODULE 11/24/2008  . GERD 03/13/2007  . TOBACCO ABUSE 11/24/2008  . EMPHYSEMA 11/08/2008  . Prostatism   . ED (erectile dysfunction)     Past Surgical History  Procedure Date  . Egd 11/19/2005    History   Social History  . Marital Status: Married    Spouse Name: N/A    Number of Children: N/A  . Years of Education: N/A   Occupational History  . INSPECTOR     works Contractor   Social History Main Topics  . Smoking status: Former Smoker -- 1.0 packs/day for 35 years    Types: Cigarettes    Quit date: 10/31/2008  . Smokeless tobacco: Never Used   Comment: pt smokes 3-4 cigarettes weekly  . Alcohol Use: Yes     2/month  . Drug Use: Not on file  . Sexually Active: Not on file   Other Topics Concern  . Not on file   Social History Narrative   Married with children    Current Outpatient Prescriptions on File Prior to Visit  Medication Sig Dispense Refill  . aspirin 81 MG tablet Take 81 mg by mouth daily.        . diazepam (VALIUM) 5 MG tablet Take 1 tablet (5 mg total) by mouth 2 (two) times daily as needed.  180 tablet  1  . insulin glargine (LANTUS) 100 UNIT/ML injection Inject 100 Units into the skin every morning. Use as directed      . Multiple Vitamin (MULTIVITAMIN PO) Take by mouth. Once daily       . omeprazole (PRILOSEC) 20 MG capsule Take 20 mg by mouth daily.        . sildenafil (VIAGRA) 100 MG tablet Take 100 mg by mouth daily as needed.         . varenicline (CHANTIX) 1 MG tablet Take 1 tablet (1 mg total) by mouth 2 (two) times daily.  180 tablet  2  . insulin lispro (HUMALOG) 100 UNIT/ML injection Inject 40 Units into the skin daily. Sliding scale        No Known Allergies  Family History  Problem Relation Age of Onset  . Stroke Mother   . Cancer Neg Hx     BP 114/64  Pulse 66  Temp(Src) 97.5 F (36.4 C) (Oral)  Ht 6\' 4"  (1.93 m)  Wt 271 lb 12.8 oz (123.288 kg)  BMI 33.08 kg/m2  SpO2 95%     Review of Systems  Constitutional: Negative for fever.  HENT: Negative for hearing loss.   Eyes: Negative for visual disturbance.  Respiratory: Negative for cough.   Cardiovascular: Negative for chest pain.  Gastrointestinal: Negative for anal bleeding.  Genitourinary: Negative for hematuria and difficulty urinating.  Musculoskeletal: Negative for back pain.  Skin: Negative for rash.  Neurological: Negative for headaches.  Hematological: Does not bruise/bleed easily.  Psychiatric/Behavioral: Negative for dysphoric mood.       Objective:   Physical Exam VS: see  vs page GEN: no distress HEAD: head: no deformity eyes: no periorbital swelling, no proptosis external nose and ears are normal mouth: no lesion seen NECK: supple, thyroid is not enlarged CHEST WALL: no deformity BREASTS:  No gynecomastia ABD: abdomen is soft, nontender.  no hepatosplenomegaly.  not distended.  no hernia.   RECTAL: normal external and internal exam.  heme neg. PROSTATE:  Normal size.  No nodule MUSCULOSKELETAL: muscle bulk and strength are grossly normal.  no obvious joint swelling.  gait is normal and steady EXTEMITIES: no deformity.  no ulcer on the feet.  feet are of normal color and temp.  no edema.  Skin on the feet is dry. PULSES: dorsalis pedis intact bilat.  no carotid bruit NEURO:  cn 2-12 grossly intact.   readily moves all 4's.  sensation is intact to touch on the feet SKIN:  Normal texture and temperature.  No rash or  suspicious lesion is visible.   NODES:  None palpable at the neck PSYCH: alert, oriented x3.  Does not appear anxious nor depressed.       Assessment & Plan:  Wellness visit today, with problems stable, except as noted.    SEPARATE EVALUATION FOLLOWS--EACH PROBLEM HERE IS NEW, NOT RESPONDING TO TREATMENT, OR POSES SIGNIFICANT RISK TO THE PATIENT'S HEALTH:  HISTORY OF THE PRESENT ILLNESS: The state of at least three ongoing medical problems is addressed today: Dyslipidemia: Pt says he has lost 18 lbs, due to his efforts.   DM: no cbg record, but states cbg's are well-controlled.  he now takes no humalog.  denies hypoglycemia. HTN: Sob is better recently. PAST MEDICAL HISTORY reviewed and up to date today REVIEW OF SYSTEMS: Denies numbness and syncope PHYSICAL EXAMINATION: VITAL SIGNS:  See vs page GENERAL: no distress LUNGS:  Clear to auscultation HEART:  Regular rate and rhythm without murmurs noted. Normal S1,S2.   LAB/XRAY RESULTS: Lab Results  Component Value Date   HGBA1C 7.0* 07/25/2011  IMPRESSION: Htn, overcontrolled, due to weight loss efforts Dyslipidemia is well-controlled, but he wants a cheaper med. DM--overcontrolled, given this regimen, which does match insulin to his changing needs throughout the day PLAN: See instruction page

## 2011-08-06 ENCOUNTER — Encounter (HOSPITAL_COMMUNITY)
Admission: RE | Admit: 2011-08-06 | Discharge: 2011-08-06 | Disposition: A | Discharge status: Home / Self Care | Payer: Managed Care, Other (non HMO) | Source: Ambulatory Visit | Attending: Pulmonary Disease | Admitting: Pulmonary Disease

## 2011-08-08 ENCOUNTER — Encounter (HOSPITAL_COMMUNITY)
Admission: RE | Admit: 2011-08-08 | Discharge: 2011-08-08 | Disposition: A | Discharge status: Home / Self Care | Payer: Managed Care, Other (non HMO) | Source: Ambulatory Visit | Attending: Pulmonary Disease | Admitting: Pulmonary Disease

## 2011-08-09 ENCOUNTER — Ambulatory Visit (AMBULATORY_SURGERY_CENTER): Payer: Managed Care, Other (non HMO) | Admitting: *Deleted

## 2011-08-09 VITALS — Ht 76.0 in | Wt 271.0 lb

## 2011-08-09 DIAGNOSIS — K2270 Barrett's esophagus without dysplasia: Secondary | ICD-10-CM

## 2011-08-09 DIAGNOSIS — Z1211 Encounter for screening for malignant neoplasm of colon: Secondary | ICD-10-CM

## 2011-08-09 MED ORDER — PEG-KCL-NACL-NASULF-NA ASC-C 100 G PO SOLR: ORAL | Status: DC

## 2011-08-13 ENCOUNTER — Encounter (HOSPITAL_COMMUNITY): Payer: Managed Care, Other (non HMO)

## 2011-08-15 ENCOUNTER — Encounter (HOSPITAL_COMMUNITY): Payer: Managed Care, Other (non HMO)

## 2011-08-20 ENCOUNTER — Encounter (HOSPITAL_COMMUNITY): Payer: Managed Care, Other (non HMO)

## 2011-08-20 DIAGNOSIS — J984 Other disorders of lung: Secondary | ICD-10-CM | POA: Insufficient documentation

## 2011-08-20 DIAGNOSIS — Z5189 Encounter for other specified aftercare: Secondary | ICD-10-CM | POA: Insufficient documentation

## 2011-08-20 DIAGNOSIS — J438 Other emphysema: Secondary | ICD-10-CM | POA: Insufficient documentation

## 2011-08-22 ENCOUNTER — Encounter (HOSPITAL_COMMUNITY)
Admission: RE | Admit: 2011-08-22 | Discharge: 2011-08-22 | Disposition: A | Discharge status: Home / Self Care | Payer: Managed Care, Other (non HMO) | Source: Ambulatory Visit | Attending: Pulmonary Disease | Admitting: Pulmonary Disease

## 2011-08-23 ENCOUNTER — Encounter: Payer: Managed Care, Other (non HMO) | Admitting: Internal Medicine

## 2011-08-27 ENCOUNTER — Encounter (HOSPITAL_COMMUNITY)
Admission: RE | Admit: 2011-08-27 | Discharge: 2011-08-27 | Disposition: A | Discharge status: Home / Self Care | Payer: Managed Care, Other (non HMO) | Source: Ambulatory Visit | Attending: Pulmonary Disease | Admitting: Pulmonary Disease

## 2011-08-29 ENCOUNTER — Encounter (HOSPITAL_COMMUNITY): Payer: Managed Care, Other (non HMO)

## 2011-08-30 ENCOUNTER — Ambulatory Visit (AMBULATORY_SURGERY_CENTER): Payer: Managed Care, Other (non HMO) | Admitting: Internal Medicine

## 2011-08-30 ENCOUNTER — Encounter: Payer: Self-pay | Admitting: Internal Medicine

## 2011-08-30 DIAGNOSIS — K219 Gastro-esophageal reflux disease without esophagitis: Secondary | ICD-10-CM

## 2011-08-30 DIAGNOSIS — D126 Benign neoplasm of colon, unspecified: Secondary | ICD-10-CM

## 2011-08-30 DIAGNOSIS — Z1211 Encounter for screening for malignant neoplasm of colon: Secondary | ICD-10-CM

## 2011-08-30 DIAGNOSIS — K2270 Barrett's esophagus without dysplasia: Secondary | ICD-10-CM

## 2011-08-30 DIAGNOSIS — Z86010 Personal history of colonic polyps: Secondary | ICD-10-CM

## 2011-08-30 LAB — GLUCOSE, CAPILLARY
Glucose-Capillary: 173 mg/dL — ABNORMAL HIGH (ref 70–99)
Glucose-Capillary: 146 mg/dL — ABNORMAL HIGH (ref 70–99)

## 2011-08-30 MED ORDER — SODIUM CHLORIDE 0.9 % IV SOLN: 500.0000 mL | INTRAVENOUS | Status: DC

## 2011-08-30 NOTE — Op Note (Signed)
[facility] [address] [place], [place]  [address]  ENDOSCOPY PROCEDURE REPORT  PATIENT:  Ronald Johnson, Ronald Johnson  MR#:  [id number] BIRTHDATE:  Feb 21, 1946, 65 yrs. old  GENDER:  male  ENDOSCOPIST:  Wilhemina Bonito. Eda Keys, MD Referred by:  Surveillance Program Recall,  PROCEDURE DATE:  08/30/2011 PROCEDURE:  EGD with biopsy, [address] ASA CLASS:  Class II INDICATIONS:  h/o [name]'s Esophagus ; last exam w/ [facility] 11-2005  MEDICATIONS:   MAC sedation, administered by CRNA, propofol (Diprivan) 150 mg IV TOPICAL ANESTHETIC:  none  DESCRIPTION OF PROCEDURE:   After the risks benefits and alternatives of the procedure were thoroughly explained, informed consent was obtained.  The LB [id number]-H180 [id number] endoscope was introduced through the mouth and advanced to the second portion of the duodenum, without limitations.  The instrument was slowly withdrawn as the mucosa was fully examined. <<PROCEDUREIMAGES>>  There were columnar-type mucosal changes in the distal esophagus (single tongue < 1cm), that could represent [name]'s esophagus and was biopsied.  Otherwise the examination was normal of the esophagus, stomach, and duodenum.    Retroflexed views revealed a small hiatal hernia.    The scope was then withdrawn from the patient and the procedure completed.  COMPLICATIONS:  None  ENDOSCOPIC IMPRESSION: 1) [name]'s, possible 2) Otherwise normal examination 3) A small hiatal hernia RECOMMENDATIONS: 1) REPEAT SURVEILLANCE EGD IN 5 YEARS if [name]'s 2) Continue Prilosec daily  ______________________________ Wilhemina Bonito. Eda Keys, MD  CC:  Minus Breeding, MD;  The Patient  n. eSIGNED:   Wilhemina Bonito. Eda Keys at 08/30/2011 12:18 PM  Richardean Chimera, [phone]

## 2011-08-30 NOTE — Patient Instructions (Signed)
Please read the handouts given to you by your recovery room nurse.   Your polyp results will be mailed to your home within 2 weeks.  You may resume your routine medications today.    You need to increase the fiber in your diet due to your diverticulosis.   We will also inform you if you have [name]'s esophagus.   You need to have another colonoscopy in one year, and another EGD in 5 yrs. If you are positive for [name]'s esophagus. If you have any questions, please call us at [phone].  Thank-you.

## 2011-08-30 NOTE — Op Note (Signed)
[facility] [address] [place], [place]  [address]  COLONOSCOPY PROCEDURE REPORT  PATIENT:  Ronald, Johnson  MR#:  [id number] BIRTHDATE:  1946/06/21, 65 yrs. old  GENDER:  male ENDOSCOPIST:  Wilhemina Bonito. Eda Keys, MD REF. BY:  Surveillance Program Recall PROCEDURE DATE:  08/30/2011 PROCEDURE:  Colonoscopy with snare polypectomy, Colonoscopy with submucosal injection EXTENDED SERVICE MODIFIER (TIME > 45 MIN AND TECHNICAL - MULTIPLE POLYPS) ASA CLASS:  Class II INDICATIONS:  history of pre-cancerous (adenomatous) colon polyps, surveillance and high-risk screening ; multiple prior exams w/ SML (last 11-2005) MEDICATIONS:   MAC sedation, administered by CRNA, propofol (Diprivan) 650 mg IV  DESCRIPTION OF PROCEDURE:   After the risks benefits and alternatives of the procedure were thoroughly explained, informed consent was obtained.  Digital rectal exam was performed and revealed no abnormalities.   The [id number] endoscope was introduced through the anus and advanced to the cecum, which was identified by both the appendix and ileocecal valve, without limitations.  The quality of the prep was excellent, using MoviPrep.  The instrument was then slowly withdrawn as the colon was fully examined. <<PROCEDUREIMAGES>>  FINDINGS:  There were multiple polyps identified and removed. 5mm in the ascending and 53mm,5mm,6mm in descending colon were snared without cautery. Retrieval was successful. Also, 25mm sessile polyp on fold in proximal transverse colon (image 6)was removed piecemeal with snare cautery. Difficult angle made the task cumbersome, but felt to be entirely resected. A marking tattoo (image 8) was placed just distal, for future purposes. A 12mm sessile polyp removed w/ snare cautery - descending colon (image 10). Sigmoid diverticulosis present. Retroflexed views in the rectum revealed no abnormalities.    The time to cecum =  1) 6 minutes. The scope was then withdrawn  in  1) 37  minutes from the cecum and the procedure completed.  COMPLICATIONS:  None  ENDOSCOPIC IMPRESSION: 1) Polyps, multiple in the  colon - removed 2) Sigmoid Diverticulosis  RECOMMENDATIONS: 1) Repeat Colonoscopy in 1 year.  ______________________________ Wilhemina Bonito. Eda Keys, MD  CC:  Minus Breeding, MD;   The Patient  n. eSIGNED:   Wilhemina Bonito. Eda Keys at 08/30/2011 12:12 PM  Richardean Chimera, [phone]

## 2011-08-30 NOTE — Progress Notes (Signed)
Patient did not have preoperative order for IV antibiotic SSI prophylaxis. (G8918)  Patient did not experience any of the following events: a burn prior to discharge; a fall within the facility; wrong site/side/patient/procedure/implant event; or a hospital transfer or hospital admission upon discharge from the facility. (G8907)  

## 2011-09-02 ENCOUNTER — Telehealth: Payer: Self-pay | Admitting: *Deleted

## 2011-09-02 NOTE — Telephone Encounter (Signed)
Left message on voice mail with ID.

## 2011-09-03 ENCOUNTER — Encounter: Payer: Self-pay | Admitting: Internal Medicine

## 2011-09-03 ENCOUNTER — Encounter (HOSPITAL_COMMUNITY)
Admission: RE | Admit: 2011-09-03 | Discharge: 2011-09-03 | Disposition: A | Discharge status: Home / Self Care | Payer: Managed Care, Other (non HMO) | Source: Ambulatory Visit | Attending: Pulmonary Disease | Admitting: Pulmonary Disease

## 2011-09-04 NOTE — Telephone Encounter (Signed)
No problems per patient 

## 2011-09-05 ENCOUNTER — Telehealth: Payer: Self-pay | Admitting: Internal Medicine

## 2011-09-05 ENCOUNTER — Encounter (HOSPITAL_COMMUNITY): Payer: Managed Care, Other (non HMO)

## 2011-09-05 NOTE — Telephone Encounter (Signed)
Noted  

## 2011-09-05 NOTE — Telephone Encounter (Signed)
Not clear why he has been sick for one week. Can't imagine it is procedure related. It would be be if he comes in and sees and extender today or tomorrow for evaluation and treatment

## 2011-09-05 NOTE — Telephone Encounter (Signed)
Spoke with pt and offered him an appt for today or tomorrow. Pt states he wants to "hold off" and see what he can do on his own. States he will call us back if he does not improve. Dr. Marina Goodell aware.

## 2011-09-05 NOTE — Telephone Encounter (Signed)
Pt states that he had and endo/colon 08/30/11 and that he has had diarrhea and vomiting ever since. States he takes 2 Immodium and it lasts for about 4 hours and then he has diarrhea again. He can keep peppermint tea and some toast down and that is about all. Pt wants to know if there is something we can call in for him. Dr. Marina Goodell please advise.

## 2011-09-08 ENCOUNTER — Ambulatory Visit (INDEPENDENT_AMBULATORY_CARE_PROVIDER_SITE_OTHER): Payer: Managed Care, Other (non HMO)

## 2011-09-08 DIAGNOSIS — R197 Diarrhea, unspecified: Secondary | ICD-10-CM

## 2011-09-08 DIAGNOSIS — R000 Tachycardia, unspecified: Secondary | ICD-10-CM

## 2011-09-08 DIAGNOSIS — R112 Nausea with vomiting, unspecified: Secondary | ICD-10-CM

## 2011-09-09 ENCOUNTER — Telehealth: Payer: Self-pay | Admitting: Internal Medicine

## 2011-09-09 NOTE — Telephone Encounter (Signed)
See extender in office, please

## 2011-09-09 NOTE — Telephone Encounter (Signed)
Scheduled patient on 09/10/11 at 8:30 AM with Mike Gip, PA

## 2011-09-10 ENCOUNTER — Encounter (HOSPITAL_COMMUNITY): Payer: Self-pay

## 2011-09-10 ENCOUNTER — Inpatient Hospital Stay (HOSPITAL_COMMUNITY)
Admission: AD | Admit: 2011-09-10 | Discharge: 2011-09-12 | DRG: 392 | Disposition: A | Discharge status: Home / Self Care | Payer: Managed Care, Other (non HMO) | Source: Ambulatory Visit | Attending: Internal Medicine | Admitting: Internal Medicine

## 2011-09-10 ENCOUNTER — Encounter (HOSPITAL_COMMUNITY): Payer: Managed Care, Other (non HMO)

## 2011-09-10 ENCOUNTER — Ambulatory Visit (INDEPENDENT_AMBULATORY_CARE_PROVIDER_SITE_OTHER): Payer: Managed Care, Other (non HMO) | Admitting: Physician Assistant

## 2011-09-10 ENCOUNTER — Encounter: Payer: Self-pay | Admitting: Physician Assistant

## 2011-09-10 VITALS — BP 84/40 | HR 64 | Ht 76.0 in | Wt 259.0 lb

## 2011-09-10 DIAGNOSIS — R112 Nausea with vomiting, unspecified: Secondary | ICD-10-CM

## 2011-09-10 DIAGNOSIS — K529 Noninfective gastroenteritis and colitis, unspecified: Principal | ICD-10-CM | POA: Diagnosis present

## 2011-09-10 DIAGNOSIS — E872 Acidosis, unspecified: Secondary | ICD-10-CM | POA: Diagnosis present

## 2011-09-10 DIAGNOSIS — R197 Diarrhea, unspecified: Secondary | ICD-10-CM | POA: Diagnosis present

## 2011-09-10 DIAGNOSIS — E871 Hypo-osmolality and hyponatremia: Secondary | ICD-10-CM | POA: Diagnosis present

## 2011-09-10 DIAGNOSIS — E119 Type 2 diabetes mellitus without complications: Secondary | ICD-10-CM | POA: Diagnosis present

## 2011-09-10 DIAGNOSIS — E860 Dehydration: Secondary | ICD-10-CM

## 2011-09-10 DIAGNOSIS — E109 Type 1 diabetes mellitus without complications: Secondary | ICD-10-CM

## 2011-09-10 DIAGNOSIS — A09 Infectious gastroenteritis and colitis, unspecified: Secondary | ICD-10-CM

## 2011-09-10 DIAGNOSIS — I959 Hypotension, unspecified: Secondary | ICD-10-CM

## 2011-09-10 DIAGNOSIS — N179 Acute kidney failure, unspecified: Secondary | ICD-10-CM | POA: Diagnosis present

## 2011-09-10 DIAGNOSIS — K5289 Other specified noninfective gastroenteritis and colitis: Secondary | ICD-10-CM

## 2011-09-10 LAB — COMPREHENSIVE METABOLIC PANEL
Alkaline Phosphatase: 71 U/L (ref 39–117)
BUN: 77 mg/dL — ABNORMAL HIGH (ref 6–23)
Chloride: 96 mEq/L (ref 96–112)
GFR calc Af Amer: 26 mL/min — ABNORMAL LOW (ref >90)
GFR calc non Af Amer: 22 mL/min — ABNORMAL LOW (ref >90)
Glucose, Bld: 211 mg/dL — ABNORMAL HIGH (ref 70–99)
Potassium: 4.7 mEq/L (ref 3.5–5.1)
Total Bilirubin: 0.2 mg/dL — ABNORMAL LOW (ref 0.3–1.2)

## 2011-09-10 LAB — PROTIME-INR: Prothrombin Time: 15 seconds (ref 11.6–15.2)

## 2011-09-10 LAB — OVA AND PARASITE EXAMINATION

## 2011-09-10 LAB — CBC
HCT: 34.2 % — ABNORMAL LOW (ref 39.0–52.0)
Hemoglobin: 12.1 g/dL — ABNORMAL LOW (ref 13.0–17.0)
MCHC: 35.4 g/dL (ref 30.0–36.0)

## 2011-09-10 MED ORDER — ONDANSETRON HCL 4 MG PO TABS: 4.0000 mg | ORAL_TABLET | Freq: Four times a day (QID) | ORAL | Status: DC | PRN

## 2011-09-10 MED ORDER — ACETAMINOPHEN 650 MG RE SUPP: 650.0000 mg | Freq: Four times a day (QID) | RECTAL | Status: DC | PRN

## 2011-09-10 MED ORDER — VANCOMYCIN 50 MG/ML ORAL SOLUTION
250.0000 mg | Freq: Four times a day (QID) | ORAL | Status: DC
  Administered 2011-09-10 – 2011-09-11 (×4): 250 mg via ORAL
  Filled 2011-09-10 (×8): qty 5

## 2011-09-10 MED ORDER — DIAZEPAM 5 MG PO TABS
5.0000 mg | ORAL_TABLET | Freq: Two times a day (BID) | ORAL | Status: DC | PRN
  Administered 2011-09-10 – 2011-09-11 (×2): 5 mg via ORAL
  Filled 2011-09-10 (×2): qty 1

## 2011-09-10 MED ORDER — PANTOPRAZOLE SODIUM 40 MG PO TBEC
40.0000 mg | DELAYED_RELEASE_TABLET | Freq: Every day | ORAL | Status: DC
  Administered 2011-09-11 – 2011-09-12 (×2): 40 mg via ORAL
  Filled 2011-09-10 (×4): qty 1

## 2011-09-10 MED ORDER — INSULIN GLARGINE 100 UNIT/ML ~~LOC~~ SOLN
100.0000 [IU] | Freq: Every day | SUBCUTANEOUS | Status: DC
  Administered 2011-09-11 – 2011-09-12 (×2): 100 [IU] via SUBCUTANEOUS
  Filled 2011-09-10: qty 3

## 2011-09-10 MED ORDER — ACETAMINOPHEN 325 MG PO TABS: 650.0000 mg | ORAL_TABLET | Freq: Four times a day (QID) | ORAL | Status: DC | PRN

## 2011-09-10 MED ORDER — KCL IN DEXTROSE-NACL 20-5-0.45 MEQ/L-%-% IV SOLN
INTRAVENOUS | Status: DC
  Administered 2011-09-10: 1000 mL via INTRAVENOUS
  Administered 2011-09-11 – 2011-09-12 (×2): via INTRAVENOUS
  Filled 2011-09-10 (×8): qty 1000

## 2011-09-10 MED ORDER — METFORMIN HCL 500 MG PO TABS
1000.0000 mg | ORAL_TABLET | Freq: Two times a day (BID) | ORAL | Status: DC
  Administered 2011-09-10: 1000 mg via ORAL
  Filled 2011-09-10 (×3): qty 2

## 2011-09-10 MED ORDER — ONDANSETRON HCL 4 MG/2ML IJ SOLN: 4.0000 mg | Freq: Four times a day (QID) | INTRAMUSCULAR | Status: DC | PRN

## 2011-09-10 MED ORDER — SACCHAROMYCES BOULARDII 250 MG PO CAPS
250.0000 mg | ORAL_CAPSULE | Freq: Two times a day (BID) | ORAL | Status: DC
  Administered 2011-09-10 – 2011-09-12 (×5): 250 mg via ORAL
  Filled 2011-09-10 (×8): qty 1

## 2011-09-10 MED ORDER — HYDROCODONE-ACETAMINOPHEN 5-325 MG PO TABS: 1.0000 | ORAL_TABLET | ORAL | Status: DC | PRN

## 2011-09-10 NOTE — H&P (Signed)
Primary Care Physician:  Romero Belling, MD, MD Primary Gastroenterologist:  Dr. Yancey Flemings  CHIEF COMPLAINT:  Diarrhea, nausea ,vomiting, weakness and weight loss  HPI: Ronald Johnson is a 66 y.o. male known to Dr. Marina Goodell with history of insulin-dependent diabetes hypertension COPD anxiety and hyperlipidemia. He underwent colonoscopy and upper endoscopy with Dr. Wendie Agreste on 08/30/2011. He does have history of [name]'s esophagus. EGD was negative and biopsies are negative for [name]'s. Colonoscopy showed several colon polyps all of which were removed. He had one larger polyp which was 25 mm in the proximal transverse colon which was resected and tattooed. All of the polyps were consistent with tubular adenomas. He was to have followup colonoscopy in one year.  He comes in today because of acute onset of illness 24 hours after his procedure at which time he began with diarrhea and nausea and has had intermittent vomiting as well. He says he is having watery foul-smelling stools with at least 10-12 bowel movements per day. He is also having nocturnal episodes of diarrhea. Is not noted any melena or hematochezia. He has not had any documented fever or sweats. He does admit to a generalized weakness and fatigue and has lost about 10 pounds over the past 10 days. He says he has been  Trying to  force fluids but has been very nauseated, so that has been difficult. He also has no appetite. He has not been on any recent antibiotics and has had no changes in any of his medications. He did go to urgent care over this last weekend and apparently they wanted to give him some IV fluids but were unable to access the vein and therefore send him home. He was given Zofran to use for nausea which she feels is somewhat helpful. He admits to lightheadedness denies any dizziness.   Past Medical History  Diagnosis Date  . COLONIC POLYPS, ADENOMATOUS 08/01/2008  . DIABETES MELLITUS, TYPE I 03/13/2007  . HYPERCHOLESTEROLEMIA  02/03/2008  . ANEMIA 08/15/2009  . Anxiety state, unspecified 12/15/2008  . DEPRESSION 03/13/2007  . HYPERTENSION 03/13/2007  . PULMONARY NODULE 11/24/2008  . GERD 03/13/2007  . TOBACCO ABUSE 11/24/2008  . EMPHYSEMA 11/08/2008  . Prostatism   . ED (erectile dysfunction)     Past Surgical History  Procedure Date  . Egd 11/19/2005  . Foot fracture surgery 1980    right foot w/ pins and screws  . Removal pins and screws foot 1980    right foot  . Upper gastrointestinal endoscopy   . Circumcision     Prior to Admission medications   Medication Sig Start Date End Date Taking? Authorizing Provider  amLODipine (NORVASC) 2.5 MG tablet Take 1 tablet (2.5 mg total) by mouth daily. 08/02/11  Yes Romero Belling, MD  aspirin 81 MG tablet Take 81 mg by mouth daily.     Yes Historical Provider, MD  atorvastatin (LIPITOR) 80 MG tablet Take 1 tablet (80 mg total) by mouth daily. 08/02/11 08/01/12 Yes Romero Belling, MD  diazepam (VALIUM) 5 MG tablet Take 1 tablet (5 mg total) by mouth 2 (two) times daily as needed. 05/29/11  Yes Romero Belling, MD  glucose blood (PRECISION XTRA TEST STRIPS) test strip Check blood sugar three times a day dx 250.01 08/02/11  Yes Romero Belling, MD  insulin glargine (LANTUS) 100 UNIT/ML injection Inject 100 Units into the skin every morning. Use as directed   Yes Historical Provider, MD  losartan-hydrochlorothiazide (HYZAAR) 100-25 MG per tablet Take 1 tablet by mouth  daily. 08/02/11  Yes Romero Belling, MD  metFORMIN (GLUCOPHAGE) 1000 MG tablet Take 1 tablet (1,000 mg total) by mouth 2 (two) times daily. 08/02/11  Yes Romero Belling, MD  metoprolol succinate (TOPROL-XL) 25 MG 24 hr tablet  06/17/11  Yes Historical Provider, MD  Multiple Vitamin (MULTIVITAMIN PO) Take by mouth. Once daily    Yes Historical Provider, MD  omeprazole (PRILOSEC) 20 MG capsule Take 20 mg by mouth daily.     Yes Historical Provider, MD  ondansetron (ZOFRAN) 8 MG tablet Take 8 mg by mouth every 8 (eight) hours as  needed.   Yes Historical Provider, MD  sildenafil (VIAGRA) 100 MG tablet Take 100 mg by mouth daily as needed.     Yes Historical Provider, MD    No current outpatient prescriptions on file.    Allergies as of 09/10/2011  . (No Known Allergies)    Family History  Problem Relation Age of Onset  . Stroke Mother   . Cancer Neg Hx   . Colon cancer Neg Hx   . Esophageal cancer Neg Hx   . Stomach cancer Neg Hx   . Rectal cancer Neg Hx     History   Social History  . Marital Status: Married    Spouse Name: N/A    Number of Children: N/A  . Years of Education: N/A   Occupational History  . INSPECTOR     works Contractor   Social History Main Topics  . Smoking status: Former Smoker -- 1.0 packs/day for 35 years    Types: Cigarettes    Quit date: 10/31/2008  . Smokeless tobacco: Never Used   Comment: pt smokes 3-4 cigarettes weekly  . Alcohol Use: No     2/month  . Drug Use: Not on file  . Sexually Active: Not on file   Other Topics Concern  . Not on file   Social History Narrative   Married with children    Review of Systems:Pertinent positive and negative review of systems were noted in the above HPI section.  All other review of systems was otherwise negative. Physical Exam: Vital signs in last 24 hours: @VSRANGES @   General:   Alert,  Well-developed,weak appearingpleasant and cooperative in NAD BP;84/40, P;64 Head:  Normocephalic and atraumatic. Eyes:  Sclera clear, no icterus.   Conjunctiva pink. Ears:  Normal auditory acuity. Nose:  No deformity, discharge,  or lesions. Mouth:  No deformity or lesions.  Oropharynx pinksomewhat dry Neck:  Supple; no masses or thyromegaly. Lungs:  Clear throughout to auscultation.   No wheezes, crackles, or rhonchi. No acute distress. Heart:  Regular rate and rhythm; no murmurs, clicks, rubs,  or gallops. Abdomen:  Soft, nontender and nondistended. No masses, hepatosplenomegaly or hernias noted. Normal bowel  sounds, without guarding,    Rectal:  Deferred     Msk:  Symmetrical without gross deformities. Normal posture. Pulses:  Normal pulses noted. Extremities:  Without clubbing or edema. Neurologic:  Alert and  oriented x4;  grossly normal neurologically. Skin:  Intact without significant lesions or rashes. Cervical Nodes:  No significant cervical adenopathy. Psych:  Alert and cooperative. Normal mood and affect.  Intake/Output from previous day:   Intake/Output this shift: @IOTHISSHIFT @  Lab Results: No results found for this basename: WBC:3,HGB:3,HCT:3,PLT:3 in the last 72 hours BMET No results found for this basename: NA:3,K:3,CL:3,CO2:3,GLUCOSE:3,BUN:3,CREATININE:3,CALCIUM:3 in the last 72 hours LFT No results found for this basename: PROT,ALBUMIN,AST,ALT,ALKPHOS,BILITOT,BILIDIR,IBILI in the last 72 hours PT/INR No results found for this  basename: LABPROT:2,INR:2 in the last 72 hours Hepatitis Panel No results found for this basename: HEPBSAG,HCVAB,HEPAIGM,HEPBIGM in the last 72 hours  Studies/Results: No results found.  Impression / Plan: #29 66 year old male with a 10 day history of watery diarrhea nausea intermittent vomiting weight loss and weakness. Suspect an infectious colitis or gastroenteritis cannot rule out C. difficile. #2 dehydration secondary to above #3 hypotension secondary to above #4 insulin-dependent diabetes mellitus #5 history of hypertension patient has continued on his antihypertensives through his illness #6 COPD #7 adenomatous colon polyps, will need followup in one year Plan; Since he is admitted to [facility] for Baseline labs IV fluid rehydration, stool cultures, stool for C. difficile by PCR Start full liquid diet Will empirically start vancomycin and florastor until results of C. difficile have returned Would hold his antihypertensives for now. For details please see the orders     @RRHLOS @  Shahin Knierim  09/10/2011,  11:14 AM

## 2011-09-10 NOTE — Progress Notes (Signed)
Patient ID: COLA HIGHFILL, male   DOB: 05/20/1946, 66 y.o.   MRN: [id number]  Rollyn were seen and evaluated in the office today with 10 day history of fairly profuse watery diarrhea nausea and intermittent emesis. His symptoms occurred within 24 hours of undergoing colonoscopy and upper endoscopy on January 11, appendectomy [facility] with Dr. Marina Goodell. He has had progressive weakness, poor by mouth intake and is hypotensive in the office with blood pressure of 84/40. Decision made to admit the patient to [facility] for IV fluid hydration stool cultures and further diagnostic evaluation. He appears to have an infectious colitis, rule out C. difficile For details please see the admission history and physical

## 2011-09-10 NOTE — Progress Notes (Signed)
Reviewed and agree with management. Sandon Yoho D. Hideko Esselman, M.D., FACG  

## 2011-09-10 NOTE — H&P (Addendum)
Chart was reviewed and patient was examined. X-rays were reviewed.    I agree with management and plans.  I suspect the patient has an acute gastroenteritis of infectious etiology. Assuming that his colitis is less likely.  Barbette Hair. Arlyce Dice, M.D., [facility]

## 2011-09-11 DIAGNOSIS — A09 Infectious gastroenteritis and colitis, unspecified: Secondary | ICD-10-CM

## 2011-09-11 DIAGNOSIS — E109 Type 1 diabetes mellitus without complications: Secondary | ICD-10-CM

## 2011-09-11 DIAGNOSIS — E860 Dehydration: Secondary | ICD-10-CM

## 2011-09-11 LAB — BASIC METABOLIC PANEL
CO2: 16 mEq/L — ABNORMAL LOW (ref 19–32)
Calcium: 8.2 mg/dL — ABNORMAL LOW (ref 8.4–10.5)
GFR calc Af Amer: 40 mL/min — ABNORMAL LOW (ref >90)
Glucose, Bld: 304 mg/dL — ABNORMAL HIGH (ref 70–99)
Potassium: 5.2 mEq/L — ABNORMAL HIGH (ref 3.5–5.1)
Sodium: 125 mEq/L — ABNORMAL LOW (ref 135–145)

## 2011-09-11 MED ORDER — CIPROFLOXACIN IN D5W 400 MG/200ML IV SOLN
400.0000 mg | Freq: Two times a day (BID) | INTRAVENOUS | Status: DC
  Administered 2011-09-11 – 2011-09-12 (×3): 400 mg via INTRAVENOUS
  Filled 2011-09-11 (×4): qty 200

## 2011-09-11 MED ORDER — METRONIDAZOLE IN NACL 5-0.79 MG/ML-% IV SOLN
500.0000 mg | Freq: Three times a day (TID) | INTRAVENOUS | Status: DC
  Administered 2011-09-11 – 2011-09-12 (×4): 500 mg via INTRAVENOUS
  Filled 2011-09-11 (×6): qty 100

## 2011-09-11 MED ORDER — INSULIN ASPART 100 UNIT/ML ~~LOC~~ SOLN
0.0000 [IU] | Freq: Three times a day (TID) | SUBCUTANEOUS | Status: DC
  Administered 2011-09-11: 5 [IU] via SUBCUTANEOUS
  Administered 2011-09-12: 7 [IU] via SUBCUTANEOUS
  Administered 2011-09-12: 5 [IU] via SUBCUTANEOUS
  Filled 2011-09-11: qty 3

## 2011-09-11 MED ORDER — DIPHENOXYLATE-ATROPINE 2.5-0.025 MG PO TABS
1.0000 | ORAL_TABLET | Freq: Every day | ORAL | Status: DC
  Administered 2011-09-11: 1 via ORAL
  Filled 2011-09-11: qty 1

## 2011-09-11 NOTE — Progress Notes (Signed)
Inpatient Diabetes Program Recommendations  AACE/ADA: New Consensus Statement on Inpatient Glycemic Control (2009)  Target Ranges:  Prepandial:   less than 140 mg/dL      Peak postprandial:   less than 180 mg/dL (1-2 hours)      Critically ill patients:  140 - 180 mg/dL   Reason for Visit: Hyperglycemia, fasting glucose at 214 mg/dL this am.  Inpatient Diabetes Program Recommendations Insulin - Basal: Lantus 100 units ordered to start today.  Pt did not get any insulin yesterday.  CBG this am at 214 mg/dL Correction (SSI): Pt needs correction insulin at this time as Lantus given today will not normalize glucose at this point.  Please order moderate correction tidwc and HS correction scale asap. Diet: Pt on low fiber diet.  Please add carbohydrate modfied to diet orders.  Note: Thank you, Lenor Coffin, RN, CNS, Diabetes Coordinator [phone])

## 2011-09-11 NOTE — Progress Notes (Signed)
PHARMACIST - PHYSICIAN COMMUNICATION DR: Marina Goodell CONCERNING:  METFORMIN SAFE ADMINISTRATION POLICY  RECOMMENDATION: Metformin has been placed on DISCONTINUE (rejected order) STATUS and should be reordered only after any of the conditions below are ruled out.  Current safety recommendations include avoiding metformin for a minimum of 48 hours after the patient's exposure to intravenous contrast media.  DESCRIPTION:  The Pharmacy Committee has adopted a policy that restricts the use of metformin in hospitalized patients until all the contraindications to administration have been ruled out. Specific contraindications are: [x]  Serum creatinine ? 1.5 for males []  Serum creatinine ? 1.4 for females []  Shock, acute MI, sepsis, hypoxemia, dehydration []  Planned administration of intravenous iodinated contrast media []  Heart Failure patients with low EF []  Acute or chronic metabolic acidosis (including DKA)  Gwen Her PharmD  [phone] 09/11/2011 7:33 AM

## 2011-09-11 NOTE — Progress Notes (Signed)
Pt resting in bed w/o any s/s of distress or pain. Pt is able to make needs known.

## 2011-09-11 NOTE — Progress Notes (Signed)
[facility] Progress Note  SUBJECTIVE: At least 8 non-bloody watery stools during the night. No associated cramps. No nausea.  OBJECTIVE:  Vital signs in last 24 hours: Temp:  [97.4 F (36.3 C)-98.2 F (36.8 C)] 97.8 F (36.6 C) (01/23 0520) Pulse Rate:  [67-87] 87  (01/23 0520) Resp:  [18-19] 18  (01/23 0520) BP: (83-91)/(47-60) 91/55 mmHg (01/23 0520) SpO2:  [95 %-98 %] 96 % (01/23 0520) Weight:  [117.482 kg (259 lb)] 117.482 kg (259 lb) (01/22 1153) Last BM Date: 09/10/11 General:    Well developed white male in NAD Heart:  Regular rate and rhythm Lungs: Respirations even and unlabored, lungs CTA bilaterally Abdomen:  Soft, nontender and nondistended. Normal bowel sounds. Extremities:  Without edema. Neurologic:  Alert and oriented,  grossly normal neurologically. Psych:  Cooperative. Normal mood and affect.  Lab Results:  [facility] 09/10/11 1821  WBC 11.3*  HGB 12.1*  HCT 34.2*  PLT 186   BMET  Basename 09/10/11 1821  NA 125*  K 4.7  CL 96  CO2 18*  GLUCOSE 211*  BUN 77*  CREATININE 2.83*  CALCIUM 8.1*   LFT  Basename 09/10/11 1821  PROT 6.1  ALBUMIN 3.0*  AST 9  ALT 9  ALKPHOS 71  BILITOT 0.2*  BILIDIR --  IBILI --   PT/INR  Basename 09/10/11 1821  LABPROT 15.0  INR 1.16    ASSESSMENT / PLAN:  22. 66 year old white male with watery diarrhea initially associated with nausea and vomiting but that has resolved. C-Diff by PCR is negative but lactoferrin is positive so still suspect an infectious process. Stool is watery and large volume per patient, this may be small bowel enteritis instead of colitis. Stool culture is pending. Will stop oral Vancomycin. Trial of Cipro and Flagyl (pharmancy to dose Cipro based on renal function). Continue Florastor, clear liquids, IVF. Will order Lomotil at bedtime so he can get some rest.  2.  Metabolic acidosis, hyponatremia, ARF secondary to #1. Today's BMET is pending. Get repeat labs in am   3.   Diabetes, Glucophage on hold secondary to ARF. Will add short acting insulin coverage.   LOS: 1 day   Willette Cluster  09/11/2011, 9:22 AM  GI ATTENDING  PATIENT SEEN AND EXAMINED.LABS REVIEWED. STILL WITH DIARRHEA (? VIRAL / SMALL BOWEL TYPE BY HISTORY). AGREE W/ PLANS . ADVANCE. DIET. DISCUSSED W/ PT AND WIFE.  Wilhemina Bonito. Eda Keys., M.D. [facility] Division of Gastroenterology

## 2011-09-11 NOTE — Progress Notes (Signed)
  ANTIBIOTIC CONSULT NOTE - INITIAL  Pharmacy Consult for cipro Indication: enteritis  No Known Allergies  Patient Measurements: Height: 6\' 4"  (193 cm) Weight: 259 lb (117.482 kg) IBW/kg (Calculated) : 86.8    Vital Signs: Temp: 97.8 F (36.6 C) (01/23 0520) Temp src: Axillary (01/23 0520) BP: 91/55 mmHg (01/23 0520) Pulse Rate: 87  (01/23 0520) Intake/Output from previous day: 01/22 0701 - 01/23 0700 In: 480 [P.O.:480] Out: -  Intake/Output from this shift: Total I/O In: 360 [P.O.:360] Out: -   Labs:  Basename 09/11/11 0940 09/10/11 1821  WBC -- 11.3*  HGB -- 12.1*  PLT -- 186  LABCREA -- --  CREATININE 1.95* 2.83*   Estimated Creatinine Clearance: 54.3 ml/min (by C-G formula based on Cr of 1.95).    Microbiology: Recent Results (from the past 720 hour(s))  CLOSTRIDIUM DIFFICILE BY PCR     Status: Normal   Collection Time   09/10/11  3:44 PM      Component Value Range Status Comment   C difficile by pcr NEGATIVE  NEGATIVE  Final   STOOL CULTURE     Status: Normal (Preliminary result)   Collection Time   09/10/11  3:44 PM      Component Value Range Status Comment   Specimen Description STOOL   Final    Special Requests NONE   Final    Culture Culture reincubated for better growth   Final    Report Status PENDING   Incomplete     Medical History: Past Medical History  Diagnosis Date  . COLONIC POLYPS, ADENOMATOUS 08/01/2008  . DIABETES MELLITUS, TYPE I 03/13/2007  . HYPERCHOLESTEROLEMIA 02/03/2008  . ANEMIA 08/15/2009  . Anxiety state, unspecified 12/15/2008  . DEPRESSION 03/13/2007  . HYPERTENSION 03/13/2007  . PULMONARY NODULE 11/24/2008  . GERD 03/13/2007  . TOBACCO ABUSE 11/24/2008  . EMPHYSEMA 11/08/2008  . Prostatism   . ED (erectile dysfunction)     Medications:  Scheduled:    . diphenoxylate-atropine  1 tablet Oral QHS  . insulin glargine  100 Units Subcutaneous QAC breakfast  . metronidazole  500 mg Intravenous Q8H  . pantoprazole  40 mg  Oral Q0600  . saccharomyces boulardii  250 mg Oral BID  . DISCONTD: metFORMIN  1,000 mg Oral BID WC  . DISCONTD: vancomycin  250 mg Oral Q6H   Infusions:    . dextrose 5 % and 0.45 % NaCl with KCl 20 mEq/L 1,000 mL (09/10/11 1503)   PRN: acetaminophen, acetaminophen, diazepam, HYDROcodone-acetaminophen, ondansetron (ZOFRAN) IV, ondansetron Assessment: 66 yo M w/enteritis. Pharmacy to dose Cipro. Pt also starting flagyl.  Goal of Therapy:  Appropriate renal dose of Cipro  Plan:  Cipro 400mg  IV q12h. Follow Scr. Adjust dose as appropriate.  Gwen Her PharmD  [phone] 09/11/2011 10:17 AM

## 2011-09-11 NOTE — Progress Notes (Signed)
INITIAL ADULT NUTRITION ASSESSMENT Date: 09/11/2011   Time: 10:47 AM Reason for Assessment: RN referral for weight loss  ASSESSMENT: Male 66 y.o.  Dx: Gastroenteritis  Hx:  Past Medical History  Diagnosis Date  . COLONIC POLYPS, ADENOMATOUS 08/01/2008  . DIABETES MELLITUS, TYPE I 03/13/2007  . HYPERCHOLESTEROLEMIA 02/03/2008  . ANEMIA 08/15/2009  . Anxiety state, unspecified 12/15/2008  . DEPRESSION 03/13/2007  . HYPERTENSION 03/13/2007  . PULMONARY NODULE 11/24/2008  . GERD 03/13/2007  . TOBACCO ABUSE 11/24/2008  . EMPHYSEMA 11/08/2008  . Prostatism   . ED (erectile dysfunction)    Related Meds: Scheduled Meds:   . ciprofloxacin  400 mg Intravenous Q12H  . diphenoxylate-atropine  1 tablet Oral QHS  . insulin glargine  100 Units Subcutaneous QAC breakfast  . metronidazole  500 mg Intravenous Q8H  . pantoprazole  40 mg Oral Q0600  . saccharomyces boulardii  250 mg Oral BID  . DISCONTD: metFORMIN  1,000 mg Oral BID WC  . DISCONTD: vancomycin  250 mg Oral Q6H   Continuous Infusions:   . dextrose 5 % and 0.45 % NaCl with KCl 20 mEq/L 1,000 mL (09/10/11 1503)   PRN Meds:.acetaminophen, acetaminophen, diazepam, HYDROcodone-acetaminophen, ondansetron (ZOFRAN) IV, ondansetron  Ht: 6\' 4"  (193 cm)  Wt: 259 lb (117.482 kg)  Ideal Wt: 91.8kg % Ideal Wt: 128  Usual Wt: 122.2kg % Usual Wt: 96  Body mass index is 31.53 kg/(m^2).  Food/Nutrition Related Hx: Pt admitted with 10 day history of water diarrhea with nausea and vomiting with 10 pound unintentional weight loss during this time period. Noted pt had at least 8 watery stools during the night. Pt negative for C. Difficile. Pt denies any nausea or vomiting. Pt reports normal blood sugars at home. Pt reports tolerating full liquid diet well and ready for diet to be advanced.   Labs:  CMP     Component Value Date/Time   NA 125* 09/11/2011 0940   K 5.2* 09/11/2011 0940   CL 101 09/11/2011 0940   CO2 16* 09/11/2011 0940   GLUCOSE  304* 09/11/2011 0940   GLUCOSE 129* 06/30/2006 0809   BUN 65* 09/11/2011 0940   CREATININE 1.95* 09/11/2011 0940   CALCIUM 8.2* 09/11/2011 0940   PROT 6.1 09/10/2011 1821   ALBUMIN 3.0* 09/10/2011 1821   AST 9 09/10/2011 1821   ALT 9 09/10/2011 1821   ALKPHOS 71 09/10/2011 1821   BILITOT 0.2* 09/10/2011 1821   GFRNONAA 35* 09/11/2011 0940   GFRAA 40* 09/11/2011 0940   CBG (last 3)   Basename 09/11/11 0748 09/10/11 1706  GLUCAP 214* 225*   Lab Results  Component Value Date   HGBA1C 7.0* 07/25/2011    Intake/Output Summary (Last 24 hours) at 09/11/11 1051 Last data filed at 09/11/11 0815  Gross per 24 hour  Intake    840 ml  Output      0 ml  Net    840 ml  Last BM - 1/22 - at least 8 watery stools   Diet Order: Full Liquid  IVF:    dextrose 5 % and 0.45 % NaCl with KCl 20 mEq/L Last Rate: 1,000 mL (09/10/11 1503)    Estimated Nutritional Needs:   Kcal:1750-2100 Protein:90-110g Fluid:1.7-2.1L  NUTRITION DIAGNOSIS: -Altered GI function (NI-1.4).  Status: Ongoing -Pt meets criteria for severe PCM of acute illness AEB 3.7% weight loss and <75% energy intake in the past week per pt statement   RELATED TO: multiple watery stools  AS EVIDENCE BY: H&P, pt  statement  MONITORING/EVALUATION(Goals):  1. Advance diet as tolerated to low fiber, diabetic diet.  2. Resolution of diarrhea.   EDUCATION NEEDS: -Education needs addressed - reviewed diarrhea nutrition therapy and provided pt with handout of this information.   INTERVENTION: Diet advancement per MD. Noted pt on Lomotil and Florastor, awaiting diarrhea improvement. Will monitor.   Dietitian #: [phone]  DOCUMENTATION CODES Per approved criteria  -Severe malnutrition in the context of acute illness or injury -Obesity unspecified    Marshall Cork 09/11/2011, 10:47 AM

## 2011-09-12 ENCOUNTER — Encounter (HOSPITAL_COMMUNITY): Payer: Managed Care, Other (non HMO)

## 2011-09-12 LAB — BASIC METABOLIC PANEL
BUN: 45 mg/dL — ABNORMAL HIGH (ref 6–23)
CO2: 18 mEq/L — ABNORMAL LOW (ref 19–32)
Calcium: 8.4 mg/dL (ref 8.4–10.5)
Creatinine, Ser: 1.57 mg/dL — ABNORMAL HIGH (ref 0.50–1.35)
Glucose, Bld: 411 mg/dL — ABNORMAL HIGH (ref 70–99)

## 2011-09-12 MED ORDER — CIPROFLOXACIN HCL 250 MG PO TABS: 250.0000 mg | ORAL_TABLET | Freq: Two times a day (BID) | ORAL | Status: AC

## 2011-09-12 MED ORDER — METRONIDAZOLE 500 MG PO TABS: 500.0000 mg | ORAL_TABLET | Freq: Three times a day (TID) | ORAL | Status: AC

## 2011-09-12 MED ORDER — SACCHAROMYCES BOULARDII 250 MG PO CAPS: 250.0000 mg | ORAL_CAPSULE | Freq: Two times a day (BID) | ORAL | Status: AC

## 2011-09-12 MED ORDER — SODIUM CHLORIDE 0.9 % IV SOLN
INTRAVENOUS | Status: DC
  Administered 2011-09-12: 12:00:00 via INTRAVENOUS
  Filled 2011-09-12 (×3): qty 1000

## 2011-09-12 NOTE — Progress Notes (Signed)
Patient received discharge instructions with wife at bedside. Both verbalized understanding. Follow up appts and medications discussed. Prescriptions called to pharmacy. Patient and wife given information on a low fiber/residue diet. Patient will ambulate downstairs to be discharged home.

## 2011-09-12 NOTE — Progress Notes (Signed)
[facility] Progress Note  SUBJECTIVE: Feels great. No diarrhea in 24 hours. Great appetite  OBJECTIVE:  Vital signs in last 24 hours: Temp:  [97.3 F (36.3 C)-97.9 F (36.6 C)] 97.3 F (36.3 C) (01/24 0634) Pulse Rate:  [81-83] 83  (01/24 0634) Resp:  [16-18] 18  (01/24 0634) BP: (81-117)/(38-68) 117/68 mmHg (01/24 0634) SpO2:  [96 %-98 %] 98 % (01/24 0634) Last BM Date: 09/11/11 General:    white male in NAD Heart:  Regular rate and rhythm Lungs: Respirations even and unlabored, lungs CTA bilaterally Abdomen:  Soft, nontender and nondistended. Normal bowel sounds. Extremities:  Without edema. Neurologic:  Alert and oriented,  grossly normal neurologically. Psych:  Cooperative. Normal mood and affect.  Lab Results:  BMET  Basename 09/12/11 0320 09/11/11 0940 09/10/11 1821  NA 132* 125* 125*  K 5.1 5.2* 4.7  CL 108 101 96  CO2 18* 16* 18*  GLUCOSE 411* 304* 211*  BUN 45* 65* 77*  CREATININE 1.57* 1.95* 2.83*  CALCIUM 8.4 8.2* 8.1*     ASSESSMENT / PLAN:  1. Watery diarrhea, resolved. NegativeC-Diff by PCR but lactoferrin was positive so still suspect an infectious process. Stool culture is pending (day #2). On day #2 of Cipro and Flagyl. Continue Florastor, low residue diet, IVF. Maybe home later today if still feeling well. If so, will need BMET next in office.  2. Metabolic acidosis, hyponatremia, ARF secondary to #1. All improving, creatinine down to 1.57 now.  3. Diabetes - Glucophage on hold secondary to ARF. Getting long acting insulin, added short active sliding scale coverage yesterday but glucose 411 at 3am this morning. It is down to 282 (7am). Will remove Dextrose from IVF and make sure diet is carb modified. Recheck am labs    LOS: 2 days   Willette Cluster  09/12/2011, 9:43 AM  GI ATTENDING  SEEN AND EXAMINED. AGREE W/ ABOVE. MUCH BETTER BY ALL MEASURES. WANTS TO GO HOME. WILL D/C ON FLAGYL. SEE D/C SUMMARY (>30 MIN).  Wilhemina Bonito. Eda Keys.,  M.D. [facility] Division of Gastroenterology

## 2011-09-14 LAB — STOOL CULTURE

## 2011-09-16 NOTE — Discharge Summary (Signed)
[facility] Discharge Summary  Name: Ronald Johnson MRN: [id number] DOB: 06/13/1946 66 y.o. PCP:  Romero Belling, MD, MD  Date of Admission: 09/10/2011 10:51 AM Date of Discharge: 09/12/2011 Attending Physician: Yancey Flemings, MD  Discharge Diagnosis: 1.  Acute, severe watery diarrhea associated with nausea and vomiting, resolved. Stool lactoferrin positive but C-Diff and culture both negative. Suspect gastroenteritis 2. Metabolic acidosis secondary to #1. Resolved. 3. ARF, also secondary to #1, significantly improved.  4. Hyponatremia, likely secondary to diarrhea, improved. 5. Diabetes, Glucophage held this admit secondary to metabolic acidosis. Patient maintained on longacting insulin with sliding scale coverage.  Consultations:  none  Procedures Performed:  None.  GI Procedures: none  History/Physical Exam:  See Admission H&P  Admission HPI: Ronald Johnson is a 66 y.o. male known to Dr. Marina Goodell with history of insulin-dependent diabetes hypertension COPD anxiety and hyperlipidemia. He underwent colonoscopy and upper endoscopy with Dr. Gwyneth Sprout on 08/30/2011. He does have history of [name]'s esophagus. EGD was negative and biopsies are negative for [name]'s. Colonoscopy showed several colon polyps all of which were removed. He had one larger polyp which was 25 mm in the proximal transverse colon which was resected and tattooed. All of the polyps were consistent with tubular adenomas. He was to have followup colonoscopy in one year. He comes in today because of acute onset of illness 24 hours after his procedure at which time he began with diarrhea and nausea and has had intermittent vomiting as well. He says he is having watery foul-smelling stools with at least 10-12 bowel movements per day. He is also having nocturnal episodes of diarrhea. Is not noted any melena or hematochezia. He has not had any documented fever or sweats. He does admit to a generalized weakness and fatigue and  has lost about 10 pounds over the past 10 days. He says he has been Trying to force fluids but has been very nauseated, so that has been difficult. He also has no appetite. He has not been on any recent antibiotics and has had no changes in any of his medications. He did go to urgent care over this last weekend and apparently they wanted to give him some IV fluids but were unable to access the vein and therefore send him home. He was given Zofran to use for nausea which she feels is somewhat helpful. He admits to lightheadedness denies any dizziness.  Hospital Course by problem list: 1.  Ten day history of acute watery diarrhea associated with abdominal cramping,nausea and vomiting.  This admission patient was given IV fluid repletion. He was empirically started on oral Vancomycin but that was stopped when C-Diff PCR found negative. Stool lactoferrin was positive and patient continued to have ongoing diarrhea so he was started on Flagyl and Cipro.  His diarrhea stopped almost immediately after the first dose of antibiotics.  After 24 hours of not having any diarrhea patient wanted to go home. He was eating well, no abdminal pain. Suspect this was gastroenteritis. Diarrhea may have ceased spontaneously without antibiotics but since it had persisted over 10 days and was associated with metabolic acidosis and ARF, decision was made to continue antibiotics at home 2. Metabolic acidosis secondary to #1. Resolved. 3. ARF, also secondary to #1, significantly improved. Patient will come for BMET next week at the office.  4. Hyponatremia, likely secondary to diarrhea, improved. 5. Diabetes, Glucophage held this admit secondary to metabolic acidosis. Patient maintained on longacting insulin with sliding scale coverage. Glucophage needs to be  resumed when renal function back to normal   Discharge Vitals:  BP 124/72  Pulse 77  Temp(Src) 97.9 F (36.6 C) (Oral)  Resp 16  Ht 6\' 4"  (1.93 m)  Wt 117.482 kg (259 lb)   BMI 31.53 kg/m2  SpO2 98%   Disposition and follow-up:   [facility]  He will come to our office in one week for labs  Follow-up Appointments: Discharge Orders    Future Appointments: Provider: Department: Dept Phone: Center:   09/17/2011 10:30 AM [facility] [phone] None   09/19/2011 9:00 AM [facility] [address] [phone] None   09/19/2011 10:30 AM [facility] [phone] None   09/24/2011 10:30 AM [facility] [phone] None   09/26/2011 10:30 AM [facility] [phone] None   10/01/2011 10:30 AM [facility] [phone] None   10/28/2011 8:45 AM Yancey Flemings, MD [facility] [place] [facility] [phone] [facility]   11/01/2011 7:45 AM Romero Belling, MD [facility] [phone] [facility]      Discharge Medications: Medication List  As of 09/16/2011  6:00 PM   STOP taking these medications         diazepam 5 MG tablet      metFORMIN 1000 MG tablet         TAKE these medications         amLODipine 2.5 MG tablet   Commonly known as: NORVASC   Take 1 tablet (2.5 mg total) by mouth daily.      aspirin 81 MG tablet   Take 81 mg by mouth daily.      atorvastatin 80 MG tablet   Commonly known as: LIPITOR   Take 1 tablet (80 mg total) by mouth daily.      ciprofloxacin 250 MG tablet   Commonly known as: CIPRO   Take 1 tablet (250 mg total) by mouth 2 (two) times daily. First dose tonight at 7pm, then take one every 12 hours for 6 days.      glucose blood test strip   Check blood sugar three times a day dx 250.01      insulin glargine 100 UNIT/ML injection   Commonly known as: LANTUS   Inject 100 Units into the skin every morning. Use as directed      losartan-hydrochlorothiazide 100-25 MG per tablet   Commonly known as: HYZAAR   Take 1 tablet by  mouth daily.      metoprolol succinate 25 MG 24 hr tablet   Commonly known as: TOPROL-XL   Take 25 mg by mouth daily.      metroNIDAZOLE 500 MG tablet   Commonly known as: FLAGYL   Take 1 tablet (500 mg total) by mouth 3 (three) times daily. Take one tablet tonight at 7pm then one tablet every 8 hours for 6 days.      MULTIVITAMIN PO   Take by mouth. Once daily      omeprazole 20 MG capsule   Commonly known as: PRILOSEC   Take 20 mg by mouth daily.      ondansetron 8 MG tablet   Commonly known as: ZOFRAN   Take 8 mg by mouth every 8 (eight) hours as needed. For nausea      saccharomyces boulardii 250 MG capsule   Commonly known as: FLORASTOR   Take 1 capsule (250 mg total) by mouth 2 (two) times daily.  sildenafil 100 MG tablet   Commonly known as: VIAGRA   Take 100 mg by mouth daily as needed.            Signed: Willette Cluster 09/16/2011, 6:00 PM

## 2011-09-17 ENCOUNTER — Encounter (HOSPITAL_COMMUNITY)
Admission: RE | Admit: 2011-09-17 | Discharge: 2011-09-17 | Disposition: A | Discharge status: Home / Self Care | Payer: Managed Care, Other (non HMO) | Source: Ambulatory Visit | Attending: Pulmonary Disease | Admitting: Pulmonary Disease

## 2011-09-19 ENCOUNTER — Encounter (HOSPITAL_COMMUNITY)
Admission: RE | Admit: 2011-09-19 | Discharge: 2011-09-19 | Disposition: A | Discharge status: Home / Self Care | Payer: Managed Care, Other (non HMO) | Source: Ambulatory Visit | Attending: Pulmonary Disease | Admitting: Pulmonary Disease

## 2011-09-19 ENCOUNTER — Ambulatory Visit: Payer: Managed Care, Other (non HMO)

## 2011-09-19 ENCOUNTER — Telehealth: Payer: Self-pay | Admitting: Internal Medicine

## 2011-09-19 DIAGNOSIS — R197 Diarrhea, unspecified: Secondary | ICD-10-CM

## 2011-09-19 LAB — BASIC METABOLIC PANEL
BUN: 20 mg/dL (ref 6–23)
CO2: 32 mEq/L (ref 19–32)
Chloride: 102 mEq/L (ref 96–112)
Creatinine, Ser: 1.3 mg/dL (ref 0.4–1.5)

## 2011-09-19 NOTE — Telephone Encounter (Signed)
Pt was in the hospital recently and wants to know if he needs a follow-up appt with Dr. Marina Goodell. He is scheduled in March for follow-up from endo/colon. He wants to know if he needs to be seen, states Dr. Marina Goodell went over everything with him while he was in the hospital. Dr. Marina Goodell please advise.

## 2011-09-19 NOTE — Progress Notes (Signed)
Ronald Johnson has attended Pulm Rehab today , second visit since his recent hospitalization.  We have requested of him to decrease his work loads and gradually move back to his levels prior to his hospitalization.  We advised against using weights until he is back to baseline.  He has requested early discharge from program, we have encouraged his PLB technique and will discharge next week.  He still does need to further concentrate on PLB to assist in his endurance. This has been explained and he voices understanding.

## 2011-09-20 ENCOUNTER — Telehealth: Payer: Self-pay

## 2011-09-20 NOTE — Telephone Encounter (Signed)
Message copied by Michele Mcalpine on Fri Sep 20, 2011 10:30 AM ------      Message from: Hilarie Fredrickson      Created: Fri Sep 20, 2011 10:16 AM       Let pt know labs normal

## 2011-09-20 NOTE — Telephone Encounter (Signed)
Pt aware.

## 2011-09-20 NOTE — Telephone Encounter (Signed)
If he's feeling better, he doesn't need a follow up office visit. He does need a follow up colonoscopy around June or July of this year

## 2011-09-20 NOTE — Telephone Encounter (Signed)
Spoke with pt and he is aware. He states he is feeling great and will cancel the appt. Pt also informed of lab results.

## 2011-09-24 ENCOUNTER — Encounter (HOSPITAL_COMMUNITY)
Admission: RE | Admit: 2011-09-24 | Discharge: 2011-09-24 | Disposition: A | Discharge status: Home / Self Care | Payer: Managed Care, Other (non HMO) | Source: Ambulatory Visit | Attending: Pulmonary Disease | Admitting: Pulmonary Disease

## 2011-09-24 DIAGNOSIS — J438 Other emphysema: Secondary | ICD-10-CM | POA: Insufficient documentation

## 2011-09-24 DIAGNOSIS — Z5189 Encounter for other specified aftercare: Secondary | ICD-10-CM | POA: Insufficient documentation

## 2011-09-24 DIAGNOSIS — J984 Other disorders of lung: Secondary | ICD-10-CM | POA: Insufficient documentation

## 2011-09-26 ENCOUNTER — Encounter (HOSPITAL_COMMUNITY)
Admission: RE | Admit: 2011-09-26 | Discharge: 2011-09-26 | Disposition: A | Discharge status: Home / Self Care | Payer: Managed Care, Other (non HMO) | Source: Ambulatory Visit | Attending: Pulmonary Disease | Admitting: Pulmonary Disease

## 2011-09-26 NOTE — Progress Notes (Addendum)
Pulmonary Rehabilitation Program Progress Report   Orientation:  06/13/2011 Graduate Date:  09/26/2011 Discharge Date:  09/26/2011 # of sessions completed: 21/21  Pulmonologist: Alva  Class Time:  10:30  A.  Exercise Program:  Tolerates exercise @ 3.30 METS for 45 minutes, Walk Test Results:  Pre: 1579 and Post: 1446, Decreased functional capacity  8.42 %, Improved  muscular strength  7.54 %, Improved dyspnea score -12.00 %, Improved education score 3.0 % and Discharged to home exercise program.  Anticipated compliance:  excellent  B.  Mental Health:  Good mental attitude, Health related anxiety and Quality of Life (QOL)  changes:  Overall  -38.12 %, Health/Functioning -49.33 %, Socioeconomics -5.67 %, Psych/Spiritual 20.26 %, Family 54.29 %    C.  Education/Instruction/Skills  Attended 10 education classes  Uses Perceived Exertion Scale and/or Dyspnea Scale  D.  Nutrition/Weight Control/Body Composition:  Patient has lost 10.2 kg, BMI 33.6, % body fat 32.2, Evidence of fat loss  *This section completed by Mickle Plumb, Andres Shad, RD, LDN, CDE  E.  Blood Lipids    Component Value Date/Time   CHOL 90 07/25/2011 1303   TRIG 152.0* 07/25/2011 1303   HDL 29.50* 07/25/2011 1303   CHOLHDL 3 07/25/2011 1303   VLDL 30.4 07/25/2011 1303   LDLCALC 30 07/25/2011 1303   F.  Lifestyle Changes:  Making positive lifestyle changes  G.  Symptoms noted with exercise:  Fatigue  Report Completed By:  Brock Ra L   Comments:  Patient graduated early from the pulmonary undergrad program. Patient only exercised 2 weeks after last hospitalization and chose to grad the program early due to feeling like he didn't need the rehab anymore. Patient completed post 6 minute walk test and showed improvement in oxygen saturations but a decrease in distance. We explained that the goal was to improve O2 sats and learn how to conserve energy and pursed lip breathe. Patient was ok with  that. Patient stated that he "felt great" and "he understood how to purse lip breathe". Quality of life shows a decrease but again the patient had just been released from the hospital when he filled out the form.  All goals were achieved and patient plans to attend the [facility] for a few months to continue exercising then come to volunteer.

## 2011-10-01 ENCOUNTER — Telehealth: Payer: Self-pay | Admitting: Internal Medicine

## 2011-10-01 ENCOUNTER — Encounter (HOSPITAL_COMMUNITY): Payer: Managed Care, Other (non HMO)

## 2011-10-01 NOTE — Telephone Encounter (Signed)
Spoke with pt and he is aware. Instructed pt to call us back if no improvement.

## 2011-10-01 NOTE — Telephone Encounter (Signed)
Pt states he is eating a lot of fruit, states he may need to make some changes in his diet to decrease the diarrhea. Pt wants to know if there is anything else he can do to help with the diarrhea. Please advise.

## 2011-10-01 NOTE — Telephone Encounter (Signed)
Pt states that he is having about 6 diarrhea stools/day. States the diarrhea is not as bad as it was when he was in the hospital. Pt

## 2011-10-01 NOTE — Telephone Encounter (Signed)
Ok to use low dose imodium (up to 4 per day)

## 2011-10-08 ENCOUNTER — Telehealth: Payer: Self-pay | Admitting: Internal Medicine

## 2011-10-08 NOTE — Telephone Encounter (Signed)
Pt states he is still having diarrhea. States his stool is pure water. Discussed with pt the immodium, he was told he could take up to 4/day on 10/01/11. Pt states he has only taken 2/day. He will try taking up to 4 a day and try the brat diet to see if that helps. Instructed pt to call back if no improvement. Pt verbalized understanding.

## 2011-10-28 ENCOUNTER — Other Ambulatory Visit (INDEPENDENT_AMBULATORY_CARE_PROVIDER_SITE_OTHER): Payer: Managed Care, Other (non HMO)

## 2011-10-28 ENCOUNTER — Ambulatory Visit: Payer: Managed Care, Other (non HMO) | Admitting: Internal Medicine

## 2011-10-28 DIAGNOSIS — E7800 Pure hypercholesterolemia, unspecified: Secondary | ICD-10-CM

## 2011-10-28 DIAGNOSIS — E109 Type 1 diabetes mellitus without complications: Secondary | ICD-10-CM

## 2011-10-28 LAB — LIPID PANEL
LDL Cholesterol: 36 mg/dL (ref 0–99)
Total CHOL/HDL Ratio: 3
Triglycerides: 131 mg/dL (ref 0.0–149.0)

## 2011-11-01 ENCOUNTER — Ambulatory Visit (INDEPENDENT_AMBULATORY_CARE_PROVIDER_SITE_OTHER): Payer: Managed Care, Other (non HMO) | Admitting: Endocrinology

## 2011-11-01 ENCOUNTER — Encounter: Payer: Self-pay | Admitting: Endocrinology

## 2011-11-01 VITALS — BP 130/78 | HR 80 | Temp 97.6°F | Ht 76.0 in | Wt 254.2 lb

## 2011-11-01 DIAGNOSIS — E109 Type 1 diabetes mellitus without complications: Secondary | ICD-10-CM

## 2011-11-01 DIAGNOSIS — E1029 Type 1 diabetes mellitus with other diabetic kidney complication: Secondary | ICD-10-CM

## 2011-11-01 DIAGNOSIS — E7800 Pure hypercholesterolemia, unspecified: Secondary | ICD-10-CM

## 2011-11-01 DIAGNOSIS — N058 Unspecified nephritic syndrome with other morphologic changes: Secondary | ICD-10-CM

## 2011-11-01 MED ORDER — OMEPRAZOLE 20 MG PO CPDR: 20.0000 mg | DELAYED_RELEASE_CAPSULE | Freq: Every day | ORAL | Status: DC

## 2011-11-01 MED ORDER — DIAZEPAM 5 MG PO TABS: 5.0000 mg | ORAL_TABLET | Freq: Two times a day (BID) | ORAL | Status: DC | PRN

## 2011-11-01 NOTE — Patient Instructions (Addendum)
reduce lantus to 70 units daily.  On days when you are going to be active, take just 30 units.   check your blood sugar 2 times a day.  vary the time of day when you check, between before the 3 meals, and at bedtime.  also check if you have symptoms of your blood sugar being too high or too low.  please keep a record of the readings and bring it to your next appointment here.  please call us sooner if your blood sugar goes below 70, or if it stays over 200. Please come back for a follow-up appointment in 3 months. Stop amlodipine and lipitor.

## 2011-11-01 NOTE — Progress Notes (Signed)
Subjective:    Patient ID: Ronald Johnson, male    DOB: [birth year], 66 y.o.   MRN: [id number]  HPI The state of at least three ongoing medical problems is addressed today: HTN: Denies dizziness Dyslipidemia: He continues to lose weight, due to his efforts. He has reduced lantus to 80 units qd.  no cbg record, but he states he still has hypoglycemia Past Medical History  Diagnosis Date  . COLONIC POLYPS, ADENOMATOUS 08/01/2008  . DIABETES MELLITUS, TYPE I 03/13/2007  . HYPERCHOLESTEROLEMIA 02/03/2008  . ANEMIA 08/15/2009  . Anxiety state, unspecified 12/15/2008  . DEPRESSION 03/13/2007  . HYPERTENSION 03/13/2007  . PULMONARY NODULE 11/24/2008  . GERD 03/13/2007  . TOBACCO ABUSE 11/24/2008  . EMPHYSEMA 11/08/2008  . Prostatism   . ED (erectile dysfunction)   . [name]'s esophagus   . Hiatal hernia   . Diverticulitis     Past Surgical History  Procedure Date  . Egd 11/19/2005  . Foot fracture surgery 1980    right foot w/ pins and screws  . Removal pins and screws foot 1980    right foot  . Upper gastrointestinal endoscopy   . Circumcision     History   Social History  . Marital Status: Married    Spouse Name: N/A    Number of Children: N/A  . Years of Education: N/A   Occupational History  . INSPECTOR     works Contractor   Social History Main Topics  . Smoking status: Former Smoker -- 1.0 packs/day for 35 years    Types: Cigarettes    Quit date: 10/31/2008  . Smokeless tobacco: Never Used   Comment: pt smokes 3-4 cigarettes weekly  . Alcohol Use: No     2/month  . Drug Use: No  . Sexually Active: No   Other Topics Concern  . Not on file   Social History Narrative   Married with children    Current Outpatient Prescriptions on File Prior to Visit  Medication Sig Dispense Refill  . aspirin 81 MG tablet Take 81 mg by mouth daily.        Marland Kitchen glucose blood (PRECISION XTRA TEST STRIPS) test strip Check blood sugar three times a day dx 250.01  270 each  3    . insulin glargine (LANTUS) 100 UNIT/ML injection Inject 70 Units into the skin every morning. Use as directed      . losartan-hydrochlorothiazide (HYZAAR) 100-25 MG per tablet Take 1 tablet by mouth daily.  90 tablet  3  . Multiple Vitamin (MULTIVITAMIN PO) Take by mouth. Once daily       . ondansetron (ZOFRAN) 8 MG tablet Take 8 mg by mouth every 8 (eight) hours as needed. For nausea      . sildenafil (VIAGRA) 100 MG tablet Take 100 mg by mouth daily as needed.         No Known Allergies  Family History  Problem Relation Age of Onset  . Stroke Mother   . Colon cancer Neg Hx     BP 130/78  Pulse 80  Temp(Src) 97.6 F (36.4 C) (Oral)  Ht 6\' 4"  (1.93 m)  Wt 254 lb 4 oz (115.327 kg)  BMI 30.95 kg/m2  SpO2 94%    Review of Systems Denies chest pain and sob.      Objective:   Physical Exam VITAL SIGNS:  See vs page GENERAL: no distress SKIN:  Insulin injection sites at the anterior abdomen are normal.  Lab Results  Component Value Date   HGBA1C 7.2* 10/28/2011   Lab Results  Component Value Date   CHOL 101 10/28/2011   HDL 38.40* 10/28/2011   LDLCALC 36 10/28/2011   LDLDIRECT 39.4 07/25/2011   TRIG 131.0 10/28/2011   CHOLHDL 3 10/28/2011    Assessment & Plan:  Dm, overcontrolled due to his successful weight-loss HTN, overcontrolled due to his successful weight loss. Dyslipidemia.  He may not need medication any more

## 2012-03-13 ENCOUNTER — Ambulatory Visit: Payer: Managed Care, Other (non HMO) | Admitting: Endocrinology

## 2012-03-16 ENCOUNTER — Other Ambulatory Visit (INDEPENDENT_AMBULATORY_CARE_PROVIDER_SITE_OTHER): Payer: Managed Care, Other (non HMO)

## 2012-03-16 DIAGNOSIS — E7800 Pure hypercholesterolemia, unspecified: Secondary | ICD-10-CM

## 2012-03-16 DIAGNOSIS — E109 Type 1 diabetes mellitus without complications: Secondary | ICD-10-CM

## 2012-03-16 LAB — LIPID PANEL
Cholesterol: 173 mg/dL (ref 0–200)
LDL Cholesterol: 114 mg/dL — ABNORMAL HIGH (ref 0–99)

## 2012-03-23 ENCOUNTER — Ambulatory Visit (INDEPENDENT_AMBULATORY_CARE_PROVIDER_SITE_OTHER): Payer: Managed Care, Other (non HMO) | Admitting: Endocrinology

## 2012-03-23 ENCOUNTER — Encounter: Payer: Self-pay | Admitting: Endocrinology

## 2012-03-23 VITALS — BP 170/90 | HR 70 | Temp 97.6°F | Ht 76.0 in | Wt 232.0 lb

## 2012-03-23 DIAGNOSIS — E1029 Type 1 diabetes mellitus with other diabetic kidney complication: Secondary | ICD-10-CM

## 2012-03-23 DIAGNOSIS — N058 Unspecified nephritic syndrome with other morphologic changes: Secondary | ICD-10-CM

## 2012-03-23 DIAGNOSIS — E7800 Pure hypercholesterolemia, unspecified: Secondary | ICD-10-CM

## 2012-03-23 MED ORDER — VARENICLINE TARTRATE 1 MG PO TABS: 1.0000 mg | ORAL_TABLET | Freq: Two times a day (BID) | ORAL | Status: AC

## 2012-03-23 MED ORDER — VARENICLINE TARTRATE 1 MG PO TABS: 1.0000 mg | ORAL_TABLET | Freq: Two times a day (BID) | ORAL | Status: DC

## 2012-03-23 MED ORDER — DIAZEPAM 5 MG PO TABS: 5.0000 mg | ORAL_TABLET | Freq: Two times a day (BID) | ORAL | Status: DC | PRN

## 2012-03-23 MED ORDER — SILDENAFIL CITRATE 100 MG PO TABS: 100.0000 mg | ORAL_TABLET | Freq: Every day | ORAL | Status: DC | PRN

## 2012-03-23 MED ORDER — ATORVASTATIN CALCIUM 10 MG PO TABS: 10.0000 mg | ORAL_TABLET | Freq: Every day | ORAL | Status: DC

## 2012-03-23 NOTE — Patient Instructions (Addendum)
continue lantus 50 units daily.  On days when you are going to be active, take just 30 units.   check your blood sugar 2 times a day.  vary the time of day when you check, between before the 3 meals, and at bedtime.  also check if you have symptoms of your blood sugar being too high or too low.  please keep a record of the readings and bring it to your next appointment here.  please call us sooner if your blood sugar goes below 70, or if it stays over 200.   Please come back for a follow-up appointment in 3 months.   Resume lipitor at 10 mg daily.

## 2012-03-23 NOTE — Progress Notes (Signed)
Subjective:    Patient ID: Ronald Johnson, male    DOB: [birth year], 66 y.o.   MRN: [id number]  HPI The state of at least three ongoing medical problems is addressed today: Pt returns for f/u of insulin-requiring DM (dx'ed 1993; complicated by nephropathy).  He has reduced lantus to 50 units qd. no cbg record, but states cbg's are well-controlled.   HTN: Denies dizziness Dyslipidemia: He has lost more weight, due to his efforts. Gerd:  Pt stopped ppi, because sxs are resolved.   Past Medical History  Diagnosis Date  . COLONIC POLYPS, ADENOMATOUS 08/01/2008  . DIABETES MELLITUS, TYPE I 03/13/2007  . HYPERCHOLESTEROLEMIA 02/03/2008  . ANEMIA 08/15/2009  . Anxiety state, unspecified 12/15/2008  . DEPRESSION 03/13/2007  . HYPERTENSION 03/13/2007  . PULMONARY NODULE 11/24/2008  . GERD 03/13/2007  . TOBACCO ABUSE 11/24/2008  . EMPHYSEMA 11/08/2008  . Prostatism   . ED (erectile dysfunction)   . [name]'s esophagus   . Hiatal hernia   . Diverticulitis     Past Surgical History  Procedure Date  . Egd 11/19/2005  . Foot fracture surgery 1980    right foot w/ pins and screws  . Removal pins and screws foot 1980    right foot  . Upper gastrointestinal endoscopy   . Circumcision     History   Social History  . Marital Status: Married    Spouse Name: N/A    Number of Children: N/A  . Years of Education: N/A   Occupational History  . INSPECTOR     works Contractor   Social History Main Topics  . Smoking status: Former Smoker -- 1.0 packs/day for 35 years    Types: Cigarettes    Quit date: 10/31/2008  . Smokeless tobacco: Never Used   Comment: pt smokes 3-4 cigarettes weekly  . Alcohol Use: No     2/month  . Drug Use: No  . Sexually Active: No   Other Topics Concern  . Not on file   Social History Narrative   Married with children    Current Outpatient Prescriptions on File Prior to Visit  Medication Sig Dispense Refill  . aspirin 81 MG tablet Take 81 mg by mouth  daily.        Marland Kitchen glucose blood (PRECISION XTRA TEST STRIPS) test strip Check blood sugar three times a day dx 250.01  270 each  3  . insulin glargine (LANTUS) 100 UNIT/ML injection Inject 50 Units into the skin every morning. Use as directed      . losartan-hydrochlorothiazide (HYZAAR) 100-25 MG per tablet Take 1 tablet by mouth daily.  90 tablet  3  . Multiple Vitamin (MULTIVITAMIN PO) Take by mouth. Once daily       . sildenafil (VIAGRA) 100 MG tablet Take 1 tablet (100 mg total) by mouth daily as needed.  36 tablet  3  . atorvastatin (LIPITOR) 10 MG tablet Take 1 tablet (10 mg total) by mouth daily.  90 tablet  3  . DISCONTD: amLODipine (NORVASC) 2.5 MG tablet Take 1 tablet (2.5 mg total) by mouth daily.  90 tablet  3  . DISCONTD: metoprolol succinate (TOPROL-XL) 25 MG 24 hr tablet Take 25 mg by mouth daily.         No Known Allergies  Family History  Problem Relation Age of Onset  . Stroke Mother   . Colon cancer Neg Hx     BP 170/90  Pulse 70  Temp 97.6 F (36.4 C) (Oral)  Ht 6\' 4"  (1.93 m)  Wt 232 lb (105.235 kg)  BMI 28.24 kg/m2  SpO2 93%     Review of Systems Denies chest pain and sob.    Objective:   Physical Exam VITAL SIGNS:  See vs page GENERAL: no distress Pulses: dorsalis pedis intact bilat.   Feet: no deformity.  no ulcer on the feet.  feet are of normal color and temp.  no edema Neuro: sensation is intact to touch on the feet    Lab Results  Component Value Date   WBC 11.3* 09/10/2011   HGB 12.1* 09/10/2011   HCT 34.2* 09/10/2011   PLT 186 09/10/2011   GLUCOSE 77 09/19/2011   CHOL 173 03/16/2012   TRIG 107.0 03/16/2012   HDL 37.40* 03/16/2012   LDLDIRECT 39.4 07/25/2011   LDLCALC 114* 03/16/2012   ALT 9 09/10/2011   AST 9 09/10/2011   NA 140 09/19/2011   K 4.1 09/19/2011   CL 102 09/19/2011   CREATININE 1.3 09/19/2011   BUN 20 09/19/2011   CO2 32 09/19/2011   TSH 1.96 07/25/2011   PSA 0.64 07/25/2011   INR 1.16 09/10/2011   HGBA1C 7.5* 03/16/2012    MICROALBUR 15.1* 07/25/2011      Assessment & Plan:  DM, improved with weight loss HTN: needs increased rx GERD: resolved with weight loss Dyslipidemia: he can try a lower dosage of lipitor

## 2012-05-11 ENCOUNTER — Ambulatory Visit (INDEPENDENT_AMBULATORY_CARE_PROVIDER_SITE_OTHER): Payer: PRIVATE HEALTH INSURANCE | Admitting: Pulmonary Disease

## 2012-05-11 ENCOUNTER — Encounter: Payer: Self-pay | Admitting: Pulmonary Disease

## 2012-05-11 VITALS — BP 160/80 | HR 66 | Temp 98.4°F | Ht 76.0 in | Wt 238.2 lb

## 2012-05-11 DIAGNOSIS — F17200 Nicotine dependence, unspecified, uncomplicated: Secondary | ICD-10-CM

## 2012-05-11 DIAGNOSIS — J984 Other disorders of lung: Secondary | ICD-10-CM

## 2012-05-11 DIAGNOSIS — Z23 Encounter for immunization: Secondary | ICD-10-CM

## 2012-05-11 DIAGNOSIS — J438 Other emphysema: Secondary | ICD-10-CM

## 2012-05-11 NOTE — Patient Instructions (Addendum)
Congratulations on your weight loss CT chest for FU of nodule Flu shot

## 2012-05-11 NOTE — Assessment & Plan Note (Addendum)
1 y Fu CT - if stable, can stop further imaging

## 2012-05-11 NOTE — Progress Notes (Signed)
  Subjective:    Patient ID: Ronald Johnson, male    DOB: 03/20/1946, 66 y.o.   MRN: [id number]  HPI 66/M, ex -smoker for FU of COPD & pulm nodules.  Adm for LLL pneumococcal pneumonia 3/15- 11/07/08 (blood cx pos)  CT chest march '10 >> LLL ASD, small effusion, small sub-cm RLL nodules.  04/07/09  Spirometry >>mild airway obstruction, paradoxical decrease in lung function with albuterol, 'passed out' during maneuver.  CT chest  10/12 >> stable nodules  05/11/2012 - 1 y FU LOst 50 lbs ! Completed pulm rehab quit smoking since 10/12  Excited about lifestyle change , hoping to lose more wt.  Has stopped symbicort & qvar  Back on chantix for cravings breathing has been "fantastic". denies any wheezing, cough, chest tx. no complaints. would like flu shot Denies cough, fevers, wheezing, weakness, edema.   Review of Systems neg for any significant sore throat, dysphagia, itching, sneezing, nasal congestion or excess/ purulent secretions, fever, chills, sweats, unintended wt loss, pleuritic or exertional cp, hempoptysis, orthopnea pnd or change in chronic leg swelling. Also denies presyncope, palpitations, heartburn, abdominal pain, nausea, vomiting, diarrhea or change in bowel or urinary habits, dysuria,hematuria, rash, arthralgias, visual complaints, headache, numbness weakness or ataxia.     Objective:   Physical Exam  Gen. Pleasant, well-nourished, in no distress ENT - no lesions, no post nasal drip Neck: No JVD, no thyromegaly, no carotid bruits Lungs: no use of accessory muscles, no dullness to percussion, clear without rales or rhonchi  Cardiovascular: Rhythm regular, heart sounds  normal, no murmurs or gallops, no peripheral edema Musculoskeletal: No deformities, no cyanosis or clubbing         Assessment & Plan:

## 2012-05-12 NOTE — Assessment & Plan Note (Signed)
Off meds, observe Ct aerobic exercise

## 2012-05-12 NOTE — Assessment & Plan Note (Signed)
Ct chantix x 3 mnths then stop

## 2012-05-14 ENCOUNTER — Ambulatory Visit (INDEPENDENT_AMBULATORY_CARE_PROVIDER_SITE_OTHER)
Admission: RE | Admit: 2012-05-14 | Discharge: 2012-05-14 | Disposition: A | Discharge status: Home / Self Care | Payer: PRIVATE HEALTH INSURANCE | Source: Ambulatory Visit | Attending: Pulmonary Disease | Admitting: Pulmonary Disease

## 2012-05-14 ENCOUNTER — Telehealth: Payer: Self-pay | Admitting: Pulmonary Disease

## 2012-05-14 DIAGNOSIS — J984 Other disorders of lung: Secondary | ICD-10-CM

## 2012-05-14 DIAGNOSIS — R911 Solitary pulmonary nodule: Secondary | ICD-10-CM

## 2012-05-14 NOTE — Telephone Encounter (Signed)
I tried calling him with Ct results. Faint area -new finding on current CT as compared to prior - needs FU CT in 6months - doubt this is of much concern but should be followed. Can show him pictures when we meet next time Pl arrange CT no contrast & FU in 76m

## 2012-05-19 NOTE — Telephone Encounter (Signed)
[email or web address] x1 

## 2012-05-20 NOTE — Telephone Encounter (Signed)
  Pt is aware of ct results per Dr. Vassie Loll and verbalized understanding. The schedulers have placed a reminder in the computer for a 6 mo f/u and an order for chest ct sent to the [facility].

## 2012-05-20 NOTE — Telephone Encounter (Signed)
Patient returning call.

## 2012-06-10 ENCOUNTER — Encounter: Payer: Self-pay | Admitting: Internal Medicine

## 2012-07-07 ENCOUNTER — Encounter: Payer: Self-pay | Admitting: Internal Medicine

## 2012-07-14 ENCOUNTER — Other Ambulatory Visit (INDEPENDENT_AMBULATORY_CARE_PROVIDER_SITE_OTHER): Payer: PRIVATE HEALTH INSURANCE

## 2012-07-14 DIAGNOSIS — E7800 Pure hypercholesterolemia, unspecified: Secondary | ICD-10-CM

## 2012-07-14 DIAGNOSIS — E1029 Type 1 diabetes mellitus with other diabetic kidney complication: Secondary | ICD-10-CM

## 2012-07-14 LAB — LIPID PANEL
Cholesterol: 150 mg/dL (ref 0–200)
LDL Cholesterol: 79 mg/dL (ref 0–99)
Triglycerides: 181 mg/dL — ABNORMAL HIGH (ref 0.0–149.0)

## 2012-07-20 ENCOUNTER — Encounter: Payer: Self-pay | Admitting: Endocrinology

## 2012-07-20 ENCOUNTER — Ambulatory Visit (INDEPENDENT_AMBULATORY_CARE_PROVIDER_SITE_OTHER): Payer: PRIVATE HEALTH INSURANCE | Admitting: Endocrinology

## 2012-07-20 VITALS — BP 126/76 | HR 67 | Temp 97.7°F | Wt 235.0 lb

## 2012-07-20 DIAGNOSIS — E1029 Type 1 diabetes mellitus with other diabetic kidney complication: Secondary | ICD-10-CM

## 2012-07-20 MED ORDER — OMEPRAZOLE 20 MG PO CPDR: 20.0000 mg | DELAYED_RELEASE_CAPSULE | Freq: Every day | ORAL | Status: DC

## 2012-07-20 NOTE — Progress Notes (Signed)
Subjective:    Patient ID: Ronald Johnson, male    DOB: 09-21-1945, 66 y.o.   MRN: [id number]  HPI Pt returns for f/u of insulin-requiring DM (dx'ed 1993; complicated by nephropathy).  He has reduced lantus to 50 units qd. no cbg record, but states cbg's are well-controlled. He would like to reduce the insulin in an attempt to lose more weight  Past Medical History  Diagnosis Date  . COLONIC POLYPS, ADENOMATOUS 08/01/2008  . DIABETES MELLITUS, TYPE I 03/13/2007  . HYPERCHOLESTEROLEMIA 02/03/2008  . ANEMIA 08/15/2009  . Anxiety state, unspecified 12/15/2008  . DEPRESSION 03/13/2007  . HYPERTENSION 03/13/2007  . PULMONARY NODULE 11/24/2008  . GERD 03/13/2007  . TOBACCO ABUSE 11/24/2008  . EMPHYSEMA 11/08/2008  . Prostatism   . ED (erectile dysfunction)   . [name]'s esophagus   . Hiatal hernia   . Diverticulitis     Past Surgical History  Procedure Date  . Egd 11/19/2005  . Foot fracture surgery 1980    right foot w/ pins and screws  . Removal pins and screws foot 1980    right foot  . Upper gastrointestinal endoscopy   . Circumcision     History   Social History  . Marital Status: Married    Spouse Name: N/A    Number of Children: N/A  . Years of Education: N/A   Occupational History  . INSPECTOR     works Contractor   Social History Main Topics  . Smoking status: Former Smoker -- 1.0 packs/day for 35 years    Types: Cigarettes    Quit date: 10/31/2008  . Smokeless tobacco: Never Used  . Alcohol Use: No     Comment: 2/month  . Drug Use: No  . Sexually Active: No   Other Topics Concern  . Not on file   Social History Narrative   Married with children    Current Outpatient Prescriptions on File Prior to Visit  Medication Sig Dispense Refill  . amLODipine (NORVASC) 2.5 MG tablet Take 2.5 mg by mouth daily.      Marland Kitchen aspirin 81 MG tablet Take 81 mg by mouth daily.        Marland Kitchen atorvastatin (LIPITOR) 10 MG tablet Take 1 tablet (10 mg total) by mouth daily.  90  tablet  3  . diazepam (VALIUM) 5 MG tablet Take 1 tablet (5 mg total) by mouth 2 (two) times daily as needed.  180 tablet  1  . glucose blood (PRECISION XTRA TEST STRIPS) test strip Check blood sugar three times a day dx 250.01  270 each  3  . insulin glargine (LANTUS) 100 UNIT/ML injection Inject 40 Units into the skin every morning.       Marland Kitchen losartan-hydrochlorothiazide (HYZAAR) 100-25 MG per tablet Take 1 tablet by mouth daily.  90 tablet  3  . sildenafil (VIAGRA) 100 MG tablet Take 1 tablet (100 mg total) by mouth daily as needed.  36 tablet  3  . [DISCONTINUED] metoprolol succinate (TOPROL-XL) 25 MG 24 hr tablet Take 25 mg by mouth daily.         No Known Allergies  Family History  Problem Relation Age of Onset  . Stroke Mother   . Colon cancer Neg Hx     BP 126/76  Pulse 67  Temp 97.7 F (36.5 C) (Oral)  Wt 235 lb (106.595 kg)  SpO2 97%  Review of Systems denies hypoglycemia and weight change    Objective:   Physical Exam VITAL  SIGNS:  See vs page GENERAL: no distress.   SKIN:  Insulin injection sites at the anterior abdomen are normal.     Lab Results  Component Value Date   HGBA1C 7.4* 07/14/2012      Assessment & Plan:  DM, this is the best control this pt should aim for, given this regimen, which does match insulin to his changing needs throughout the day

## 2012-07-20 NOTE — Patient Instructions (Addendum)
reduce lantus to 40 units daily.  This will help you to lose weight.  On days when you are going to be active, take just 30 units.   check your blood sugar 2 times a day.  vary the time of day when you check, between before the 3 meals, and at bedtime.  also check if you have symptoms of your blood sugar being too high or too low.  please keep a record of the readings and bring it to your next appointment here.  please call us sooner if your blood sugar goes below 70, or if it stays over 200.   Please come back for a regular physical appointment in 3 months.

## 2012-07-21 ENCOUNTER — Other Ambulatory Visit: Payer: Self-pay | Admitting: Endocrinology

## 2012-08-17 ENCOUNTER — Ambulatory Visit (AMBULATORY_SURGERY_CENTER): Payer: Managed Care, Other (non HMO) | Admitting: *Deleted

## 2012-08-17 ENCOUNTER — Encounter: Payer: Self-pay | Admitting: Internal Medicine

## 2012-08-17 VITALS — Ht 75.5 in | Wt 236.0 lb

## 2012-08-17 DIAGNOSIS — Z1211 Encounter for screening for malignant neoplasm of colon: Secondary | ICD-10-CM

## 2012-08-17 MED ORDER — MOVIPREP 100 G PO SOLR: ORAL | Status: DC

## 2012-08-27 ENCOUNTER — Other Ambulatory Visit: Payer: Self-pay

## 2012-08-27 ENCOUNTER — Encounter: Payer: Self-pay | Admitting: Endocrinology

## 2012-08-27 DIAGNOSIS — Z1211 Encounter for screening for malignant neoplasm of colon: Secondary | ICD-10-CM

## 2012-08-27 MED ORDER — AMLODIPINE BESYLATE 2.5 MG PO TABS: 2.5000 mg | ORAL_TABLET | Freq: Every day | ORAL | Status: DC

## 2012-08-27 MED ORDER — METFORMIN HCL 1000 MG PO TABS: 1000.0000 mg | ORAL_TABLET | ORAL | Status: DC

## 2012-08-27 MED ORDER — LOSARTAN POTASSIUM-HCTZ 100-25 MG PO TABS: 1.0000 | ORAL_TABLET | Freq: Every day | ORAL | Status: DC

## 2012-08-31 ENCOUNTER — Encounter: Payer: Self-pay | Admitting: Endocrinology

## 2012-09-01 ENCOUNTER — Other Ambulatory Visit: Payer: Self-pay

## 2012-09-01 ENCOUNTER — Ambulatory Visit (AMBULATORY_SURGERY_CENTER): Payer: PRIVATE HEALTH INSURANCE | Admitting: Internal Medicine

## 2012-09-01 ENCOUNTER — Encounter: Payer: Self-pay | Admitting: Internal Medicine

## 2012-09-01 VITALS — BP 117/80 | HR 65 | Temp 97.4°F | Resp 27 | Ht 75.0 in | Wt 236.0 lb

## 2012-09-01 DIAGNOSIS — D126 Benign neoplasm of colon, unspecified: Secondary | ICD-10-CM

## 2012-09-01 DIAGNOSIS — Z1211 Encounter for screening for malignant neoplasm of colon: Secondary | ICD-10-CM

## 2012-09-01 DIAGNOSIS — Z86010 Personal history of colonic polyps: Secondary | ICD-10-CM

## 2012-09-01 LAB — GLUCOSE, CAPILLARY: Glucose-Capillary: 144 mg/dL — ABNORMAL HIGH (ref 70–99)

## 2012-09-01 MED ORDER — SODIUM CHLORIDE 0.9 % IV SOLN: 500.0000 mL | INTRAVENOUS | Status: DC

## 2012-09-01 MED ORDER — LOSARTAN POTASSIUM-HCTZ 100-25 MG PO TABS: 1.0000 | ORAL_TABLET | Freq: Every day | ORAL | Status: DC

## 2012-09-01 NOTE — Patient Instructions (Addendum)

## 2012-09-01 NOTE — Op Note (Signed)
[facility] [address] [place], [address]   COLONOSCOPY PROCEDURE REPORT  PATIENT: Ronald Johnson, Ronald Johnson  MR#: [id number] BIRTHDATE: 09-24-1945 , 66  yrs. old GENDER: Male ENDOSCOPIST: Roxy Cedar, MD REFERRED NW:[id number] Program Recall PROCEDURE DATE:  09/01/2012 PROCEDURE:   Colonoscopy with snare polypectomy    x 3 ASA CLASS:   Class II INDICATIONS:Patient's personal history of adenomatous colon polyps. Multiple prior [facility]); last 1-13 w/ multiple, large piecemeal TAs MEDICATIONS: MAC sedation, administered by CRNA and propofol (Diprivan) 350mg  IV  DESCRIPTION OF PROCEDURE:   After the risks benefits and alternatives of the procedure were thoroughly explained, informed consent was obtained.  A digital rectal exam revealed no abnormalities of the rectum.   The LB [id number]  endoscope was introduced through the anus and advanced to the cecum, which was identified by both the appendix and ileocecal valve. No adverse events experienced.   The quality of the prep was good, using MoviPrep  The instrument was then slowly withdrawn as the colon was fully examined.      COLON FINDINGS: Three polyps ranging between 3-17mm in size were found in the transverse colon and sigmoid colon.  A polypectomy was performed with a cold snare.  The resection was complete and the polyp tissue was completely retrieved.   Mild diverticulosis was noted in the sigmoid colon.   The colon mucosa was otherwise normal. Prior piecemeal / tattoo area identified and found to be clean. Retroflexed views revealed internal hemorrhoids. The time to cecum=6 minutes 12 seconds.  Withdrawal time=15 minutes 53 seconds. The scope was withdrawn and the procedure completed. COMPLICATIONS: There were no complications.  ENDOSCOPIC IMPRESSION: 1.   Three polyps ranging between 3-69mm in size were found in the transverse colon and sigmoid colon; polypectomy was performed with a cold  snare 2.   Mild diverticulosis was noted in the sigmoid colon 3.   The colon mucosa was otherwise normal  RECOMMENDATIONS: 1. Repeat Colonoscopy in 3 years.   eSigned:  Roxy Cedar, MD 09/01/2012 11:24 AM cc: Minus Breeding, MD    , The Patient   PATIENT NAME:  [name], [name] MR#: [id number]

## 2012-09-01 NOTE — Progress Notes (Signed)
Lidocaine-40mg IV prior to Propofol InductionPropofol given over incremental dosages 

## 2012-09-01 NOTE — Progress Notes (Signed)
Patient did not experience any of the following events: a burn prior to discharge; a fall within the facility; wrong site/side/patient/procedure/implant event; or a hospital transfer or hospital admission upon discharge from the facility. (G8907) Patient did not have preoperative order for IV antibiotic SSI prophylaxis. (G8918)  

## 2012-09-02 ENCOUNTER — Telehealth: Payer: Self-pay | Admitting: *Deleted

## 2012-09-02 NOTE — Telephone Encounter (Signed)
  Follow up Call-  Call back number 09/01/2012 08/30/2011  Post procedure Call Back phone  # [phone]  Permission to leave phone message Yes -     Patient questions:  Do you have a fever, pain , or abdominal swelling? no Pain Score  0 *  Have you tolerated food without any problems? yes  Have you been able to return to your normal activities? yes  Do you have any questions about your discharge instructions: Diet   no Medications  no Follow up visit  no  Do you have questions or concerns about your Care? no  Actions: * If pain score is 4 or above: No action needed, pain <4.

## 2012-09-07 ENCOUNTER — Encounter: Payer: Self-pay | Admitting: Internal Medicine

## 2012-09-08 ENCOUNTER — Telehealth: Payer: Self-pay | Admitting: Endocrinology

## 2012-09-08 MED ORDER — LOSARTAN POTASSIUM-HCTZ 100-25 MG PO TABS: 1.0000 | ORAL_TABLET | Freq: Every day | ORAL | Status: DC

## 2012-09-11 ENCOUNTER — Other Ambulatory Visit: Payer: Self-pay

## 2012-09-11 MED ORDER — LOSARTAN POTASSIUM-HCTZ 100-25 MG PO TABS: 1.0000 | ORAL_TABLET | Freq: Every day | ORAL | Status: DC

## 2012-09-11 NOTE — Telephone Encounter (Signed)
The patient called and stated that [facility] still does not have his rx of Losartan.  Please review and resend if appropriate.  The patient may be reached at [phone] if needed.

## 2012-10-05 ENCOUNTER — Other Ambulatory Visit: Payer: Self-pay | Admitting: Endocrinology

## 2012-10-13 ENCOUNTER — Other Ambulatory Visit: Payer: Managed Care, Other (non HMO)

## 2012-10-13 ENCOUNTER — Other Ambulatory Visit (INDEPENDENT_AMBULATORY_CARE_PROVIDER_SITE_OTHER): Payer: PRIVATE HEALTH INSURANCE

## 2012-10-13 ENCOUNTER — Other Ambulatory Visit: Payer: Self-pay

## 2012-10-13 DIAGNOSIS — Z0000 Encounter for general adult medical examination without abnormal findings: Secondary | ICD-10-CM

## 2012-10-13 LAB — BASIC METABOLIC PANEL
Calcium: 9.5 mg/dL (ref 8.4–10.5)
GFR: 67.95 mL/min (ref >60.00)
Glucose, Bld: 160 mg/dL — ABNORMAL HIGH (ref 70–99)
Sodium: 141 mEq/L (ref 135–145)

## 2012-10-13 LAB — HEPATIC FUNCTION PANEL
ALT: 24 U/L (ref 0–53)
AST: 26 U/L (ref 0–37)
Albumin: 4.1 g/dL (ref 3.5–5.2)

## 2012-10-13 LAB — LIPID PANEL
Cholesterol: 159 mg/dL (ref 0–200)
Total CHOL/HDL Ratio: 4
Triglycerides: 143 mg/dL (ref 0.0–149.0)

## 2012-10-13 LAB — CBC WITH DIFFERENTIAL/PLATELET
Basophils Relative: 1.1 % (ref 0.0–3.0)
Eosinophils Relative: 6.2 % — ABNORMAL HIGH (ref 0.0–5.0)
HCT: 37 % — ABNORMAL LOW (ref 39.0–52.0)
Hemoglobin: 12.6 g/dL — ABNORMAL LOW (ref 13.0–17.0)
Lymphs Abs: 2.3 10*3/uL (ref 0.7–4.0)
Monocytes Relative: 6.2 % (ref 3.0–12.0)
Neutro Abs: 3.3 10*3/uL (ref 1.4–7.7)
WBC: 6.5 10*3/uL (ref 4.5–10.5)

## 2012-10-13 LAB — PSA: PSA: 0.74 ng/mL (ref 0.10–4.00)

## 2012-10-13 LAB — URINALYSIS, ROUTINE W REFLEX MICROSCOPIC
Ketones, ur: NEGATIVE
Leukocytes, UA: NEGATIVE
Specific Gravity, Urine: 1.015 (ref 1.000–1.030)
WBC, UA: NONE SEEN (ref 0–?)
pH: 7.5 (ref 5.0–8.0)

## 2012-10-20 ENCOUNTER — Encounter: Payer: Managed Care, Other (non HMO) | Admitting: Endocrinology

## 2012-10-27 ENCOUNTER — Encounter: Payer: Self-pay | Admitting: Endocrinology

## 2012-10-28 ENCOUNTER — Ambulatory Visit (INDEPENDENT_AMBULATORY_CARE_PROVIDER_SITE_OTHER)
Admission: RE | Admit: 2012-10-28 | Discharge: 2012-10-28 | Disposition: A | Discharge status: Home / Self Care | Payer: Managed Care, Other (non HMO) | Source: Ambulatory Visit | Attending: Pulmonary Disease | Admitting: Pulmonary Disease

## 2012-10-28 DIAGNOSIS — R911 Solitary pulmonary nodule: Secondary | ICD-10-CM

## 2012-11-05 ENCOUNTER — Ambulatory Visit (INDEPENDENT_AMBULATORY_CARE_PROVIDER_SITE_OTHER): Payer: PRIVATE HEALTH INSURANCE | Admitting: Endocrinology

## 2012-11-05 ENCOUNTER — Encounter: Payer: Self-pay | Admitting: Endocrinology

## 2012-11-05 VITALS — BP 132/80 | HR 82 | Wt 236.0 lb

## 2012-11-05 MED ORDER — PAROXETINE HCL 20 MG PO TABS: 20.0000 mg | ORAL_TABLET | ORAL | Status: DC

## 2012-11-05 MED ORDER — DIAZEPAM 5 MG PO TABS: 5.0000 mg | ORAL_TABLET | Freq: Two times a day (BID) | ORAL | Status: DC | PRN

## 2012-11-05 MED ORDER — SILDENAFIL CITRATE 100 MG PO TABS: 100.0000 mg | ORAL_TABLET | Freq: Every day | ORAL | Status: DC | PRN

## 2012-11-05 NOTE — Progress Notes (Signed)
Subjective:    Patient ID: Ronald Johnson, male    DOB: 1946/02/15, 67 y.o.   MRN: [id number]  HPI here for regular wellness examination.  He's feeling pretty well in general, and says chronic med probs are stable, except as noted below Past Medical History  Diagnosis Date  . COLONIC POLYPS, ADENOMATOUS 08/01/2008  . DIABETES MELLITUS, TYPE I 03/13/2007  . HYPERCHOLESTEROLEMIA 02/03/2008  . ANEMIA 08/15/2009  . Anxiety state, unspecified 12/15/2008  . DEPRESSION 03/13/2007  . HYPERTENSION 03/13/2007  . PULMONARY NODULE 11/24/2008  . GERD 03/13/2007  . TOBACCO ABUSE 11/24/2008  . EMPHYSEMA 11/08/2008  . Prostatism   . ED (erectile dysfunction)   . [name]'s esophagus   . Hiatal hernia   . Diverticulitis   . Arthritis     Past Surgical History  Procedure Laterality Date  . Egd  11/19/2005  . Foot fracture surgery  1980    right foot w/ pins and screws  . Removal pins and screws foot  1980    right foot  . Upper gastrointestinal endoscopy    . Circumcision      History   Social History  . Marital Status: Married    Spouse Name: N/A    Number of Children: N/A  . Years of Education: N/A   Occupational History  . INSPECTOR     works Contractor   Social History Main Topics  . Smoking status: Former Smoker -- 1.00 packs/day for 35 years    Types: Cigarettes    Quit date: 10/31/2008  . Smokeless tobacco: Never Used  . Alcohol Use: No     Comment: 2/month  . Drug Use: No  . Sexually Active: No   Other Topics Concern  . Not on file   Social History Narrative   Married with children    Current Outpatient Prescriptions on File Prior to Visit  Medication Sig Dispense Refill  . amLODipine (NORVASC) 2.5 MG tablet Take 1 tablet (2.5 mg total) by mouth daily.  30 tablet  3  . aspirin 81 MG tablet Take 81 mg by mouth daily.        Marland Kitchen atorvastatin (LIPITOR) 10 MG tablet Take 1 tablet (10 mg total) by mouth daily.  90 tablet  3  . CHANTIX CONTINUING MONTH PAK 1 MG  tablet Take 1 tablet by mouth Twice daily.      Marland Kitchen glucose blood (PRECISION XTRA TEST STRIPS) test strip Check blood sugar three times a day dx 250.01  270 each  3  . insulin glargine (LANTUS) 100 UNIT/ML injection Inject 50 Units into the skin every morning. As directed      . losartan-hydrochlorothiazide (HYZAAR) 100-25 MG per tablet Take 1 tablet by mouth daily.  90 tablet  3  . omeprazole (PRILOSEC) 20 MG capsule Take 1 capsule (20 mg total) by mouth daily.  360 capsule  0  . [DISCONTINUED] metoprolol succinate (TOPROL-XL) 25 MG 24 hr tablet Take 25 mg by mouth daily.        No current facility-administered medications on file prior to visit.    No Known Allergies  Family History  Problem Relation Age of Onset  . Stroke Mother   . Colon cancer Neg Hx     BP 132/80  Pulse 82  Wt 236 lb (107.049 kg)  BMI 29.5 kg/m2  SpO2 98%     Review of Systems  Constitutional: Negative for fever.  HENT: Negative for hearing loss.   Eyes: Negative for visual  disturbance.  Respiratory: Negative for shortness of breath.   Cardiovascular: Negative for chest pain.  Gastrointestinal: Negative for anal bleeding.  Endocrine: Negative for cold intolerance.  Genitourinary: Negative for hematuria and difficulty urinating.  Musculoskeletal: Negative for back pain.  Skin: Negative for rash.  Allergic/Immunologic: Negative for environmental allergies.  Neurological: Negative for syncope, numbness and headaches.  Hematological: Does not bruise/bleed easily.  Psychiatric/Behavioral: Positive for sleep disturbance.       Objective:   Physical Exam VS: see vs page GEN: no distress HEAD: head: no deformity eyes: no periorbital swelling, no proptosis external nose and ears are normal mouth: no lesion seen NECK: supple, thyroid is not enlarged CHEST WALL: no deformity LUNGS: clear to auscultation BREASTS:  No gynecomastia CV: reg rate and rhythm, no murmur ABD: abdomen is soft, nontender.  no  hepatosplenomegaly.  not distended.  no hernia RECTAL: normal external and internal exam.  heme neg. PROSTATE:  Normal size.  No nodule MUSCULOSKELETAL: muscle bulk and strength are grossly normal.  no obvious joint swelling.  gait is normal and steady PULSES: dorsalis pedis intact bilat.  no carotid bruit NEURO:  cn 2-12 grossly intact.   readily moves all 4's.  sensation is intact to touch on the feet SKIN:  Normal texture and temperature.  No rash or suspicious lesion is visible.   NODES:  None palpable at the neck PSYCH: alert, oriented x3.  Does not appear anxious nor depressed.        Assessment & Plan:  Wellness visit today, with problems stable, except as noted.    SEPARATE EVALUATION FOLLOWS--EACH PROBLEM HERE IS NEW, NOT RESPONDING TO TREATMENT, OR POSES SIGNIFICANT RISK TO THE PATIENT'S HEALTH: HISTORY OF THE PRESENT ILLNESS: Pt returns for f/u of insulin-requiring DM (dx'ed 1993; complicated by nephropathy). He has reduced lantus to 50 units qd. no cbg record, but states cbg's are well-controlled Pt says depression is seasonal. PAST MEDICAL HISTORY reviewed and up to date today REVIEW OF SYSTEMS: Denies weight change PHYSICAL EXAMINATION: VITAL SIGNS:  See vs page GENERAL: no distress EXTEMITIES: no deformity.  no ulcer on the feet.  feet are of normal color and temp.  no edema LAB/XRAY RESULTS: Lab Results  Component Value Date   HGBA1C 8.1* 10/13/2012   Lab Results  Component Value Date   TSH 2.33 10/13/2012  IMPRESSION: DM: needs increased rx Depression, needs increased rx PLAN: See instruction page

## 2012-11-05 NOTE — Patient Instructions (Addendum)
Please stop the metformin. Please increase the lantus to 50 units each morning. check your blood sugar twice a day.  vary the time of day when you check, between before the 3 meals, and at bedtime.  also check if you have symptoms of your blood sugar being too high or too low.  please keep a record of the readings and bring it to your next appointment here.  please call us sooner if your blood sugar goes below 70, or if you have a lot of readings over 200.  i have sent a prescription to your pharmacy, for "paroxetine," for depression.   please consider these measures for your health:  minimize alcohol.  do not use tobacco products.  have a colonoscopy at least every 10 years from age 16.  keep firearms safely stored.  always use seat belts.  have working smoke alarms in your home.  see an eye doctor and dentist regularly.  never drive under the influence of alcohol or drugs (including prescription drugs).  those with fair skin should take precautions against the sun. Please come back for a follow-up appointment in 3 months.

## 2012-11-08 DIAGNOSIS — Z0000 Encounter for general adult medical examination without abnormal findings: Secondary | ICD-10-CM | POA: Insufficient documentation

## 2012-11-11 ENCOUNTER — Telehealth: Payer: Self-pay

## 2012-11-11 ENCOUNTER — Other Ambulatory Visit: Payer: Self-pay | Admitting: Endocrinology

## 2012-11-11 MED ORDER — SILDENAFIL CITRATE 20 MG PO TABS: ORAL_TABLET | ORAL | Status: DC

## 2012-11-11 NOTE — Telephone Encounter (Signed)
viagra come in 100 mg, but levitra comes in 20 mg.  Do you want to change to levitra?

## 2012-11-11 NOTE — Telephone Encounter (Signed)
Pt calling because his viagra was sent in for 100mg  tab, but he thought it was supposed to be for 20mg .  Please correct and call in to [facility].

## 2012-11-11 NOTE — Telephone Encounter (Signed)
i sent rx 

## 2012-11-11 NOTE — Telephone Encounter (Signed)
Pt states the generic form of viagra comes in 20 mg would like that and not levitra

## 2012-12-21 ENCOUNTER — Other Ambulatory Visit: Payer: Self-pay | Admitting: Endocrinology

## 2012-12-21 NOTE — Telephone Encounter (Signed)
Rx already filled. 

## 2013-02-09 ENCOUNTER — Encounter: Payer: Self-pay | Admitting: Endocrinology

## 2013-02-09 ENCOUNTER — Ambulatory Visit (INDEPENDENT_AMBULATORY_CARE_PROVIDER_SITE_OTHER): Payer: Managed Care, Other (non HMO) | Admitting: Endocrinology

## 2013-02-09 VITALS — BP 132/78 | HR 76 | Ht 76.0 in | Wt 221.0 lb

## 2013-02-09 DIAGNOSIS — E1029 Type 1 diabetes mellitus with other diabetic kidney complication: Secondary | ICD-10-CM

## 2013-02-09 MED ORDER — VARENICLINE TARTRATE 1 MG PO TABS: 1.0000 mg | ORAL_TABLET | Freq: Two times a day (BID) | ORAL | Status: DC

## 2013-02-09 NOTE — Patient Instructions (Addendum)
check your blood sugar twice a day.  vary the time of day when you check, between before the 3 meals, and at bedtime.  also check if you have symptoms of your blood sugar being too high or too low.  please keep a record of the readings and bring it to your next appointment here.  please call us sooner if your blood sugar goes below 70, or if you have a lot of readings over 200.   Please come back for a follow-up appointment in 2 months.   Please reduce the lantus to 30 units daily.   Also, please stop taking the "amlodipine."

## 2013-02-09 NOTE — Progress Notes (Signed)
Subjective:    Patient ID: Ronald Johnson, male    DOB: [birth year], 67 y.o.   MRN: [id number]  HPI Pt returns for f/u of insulin-requiring DM (dx'ed 1993; he has mild if any neuropathy of the lower extremities, but he has associated nephropathy).  no cbg record, but states cbg's are frequently low in the early hrs of the AM.  He wants to wait until next ov for a1c.   pt states he feels well in general. He has stopped paxil and prilosec, due to improved sxs. Past Medical History  Diagnosis Date  . COLONIC POLYPS, ADENOMATOUS 08/01/2008  . DIABETES MELLITUS, TYPE I 03/13/2007  . HYPERCHOLESTEROLEMIA 02/03/2008  . ANEMIA 08/15/2009  . Anxiety state, unspecified 12/15/2008  . DEPRESSION 03/13/2007  . HYPERTENSION 03/13/2007  . PULMONARY NODULE 11/24/2008  . GERD 03/13/2007  . TOBACCO ABUSE 11/24/2008  . EMPHYSEMA 11/08/2008  . Prostatism   . ED (erectile dysfunction)   . [name]'s esophagus   . Hiatal hernia   . Diverticulitis   . Arthritis     Past Surgical History  Procedure Laterality Date  . Egd  11/19/2005  . Foot fracture surgery  1980    right foot w/ pins and screws  . Removal pins and screws foot  1980    right foot  . Upper gastrointestinal endoscopy    . Circumcision      History   Social History  . Marital Status: Married    Spouse Name: N/A    Number of Children: N/A  . Years of Education: N/A   Occupational History  . INSPECTOR     works Contractor   Social History Main Topics  . Smoking status: Former Smoker -- 1.00 packs/day for 35 years    Types: Cigarettes    Quit date: 10/31/2008  . Smokeless tobacco: Never Used  . Alcohol Use: No     Comment: 2/month  . Drug Use: No  . Sexually Active: No   Other Topics Concern  . Not on file   Social History Narrative   Married with children    Current Outpatient Prescriptions on File Prior to Visit  Medication Sig Dispense Refill  . aspirin 81 MG tablet Take 81 mg by mouth daily.        Marland Kitchen  atorvastatin (LIPITOR) 10 MG tablet Take 1 tablet (10 mg total) by mouth daily.  90 tablet  3  . diazepam (VALIUM) 5 MG tablet Take 1 tablet (5 mg total) by mouth 2 (two) times daily as needed.  180 tablet  1  . glucose blood (PRECISION XTRA TEST STRIPS) test strip Check blood sugar three times a day dx 250.01  270 each  3  . insulin glargine (LANTUS) 100 UNIT/ML injection Inject 30 Units into the skin every morning. As directed      . losartan-hydrochlorothiazide (HYZAAR) 100-25 MG per tablet Take 1 tablet by mouth daily.  90 tablet  3  . sildenafil (REVATIO) 20 MG tablet Tab q tab as needed  10 tablet  11  . [DISCONTINUED] metoprolol succinate (TOPROL-XL) 25 MG 24 hr tablet Take 25 mg by mouth daily.        No current facility-administered medications on file prior to visit.    No Known Allergies  Family History  Problem Relation Age of Onset  . Stroke Mother   . Colon cancer Neg Hx     BP 132/78  Pulse 76  Ht 6\' 4"  (1.93 m)  Wt 221  lb (100.245 kg)  BMI 26.91 kg/m2  SpO2 98%  Review of Systems He has lost more weight.  Denies LOC    Objective:   Physical Exam VITAL SIGNS:  See vs page GENERAL: no distress     Assessment & Plan:  DM: This insulin regimen was chosen from multiple options, for its simplicity.  The benefits of glycemic control must be weighed against the risks of hypoglycemia.  It is overcontrolled due to weight loss. Depression, better GERD, better

## 2013-03-15 ENCOUNTER — Other Ambulatory Visit: Payer: Self-pay | Admitting: Endocrinology

## 2013-03-15 ENCOUNTER — Other Ambulatory Visit: Payer: Self-pay | Admitting: *Deleted

## 2013-03-15 MED ORDER — ATORVASTATIN CALCIUM 10 MG PO TABS: 10.0000 mg | ORAL_TABLET | Freq: Every day | ORAL | Status: DC

## 2013-04-15 ENCOUNTER — Ambulatory Visit: Payer: Managed Care, Other (non HMO) | Admitting: Endocrinology

## 2013-04-29 ENCOUNTER — Encounter: Payer: Self-pay | Admitting: Endocrinology

## 2013-04-29 ENCOUNTER — Ambulatory Visit (INDEPENDENT_AMBULATORY_CARE_PROVIDER_SITE_OTHER): Payer: Medicare Other | Admitting: Endocrinology

## 2013-04-29 VITALS — BP 126/78 | HR 80 | Ht 76.0 in | Wt 219.0 lb

## 2013-04-29 DIAGNOSIS — Z87891 Personal history of nicotine dependence: Secondary | ICD-10-CM | POA: Diagnosis not present

## 2013-04-29 DIAGNOSIS — E119 Type 2 diabetes mellitus without complications: Secondary | ICD-10-CM | POA: Diagnosis not present

## 2013-04-29 DIAGNOSIS — E1029 Type 1 diabetes mellitus with other diabetic kidney complication: Secondary | ICD-10-CM

## 2013-04-29 NOTE — Progress Notes (Signed)
Subjective:    Patient ID: Ronald Johnson, male    DOB: August 16, 1946, 67 y.o.   MRN: [id number]  HPI Pt returns for f/u of insulin-requiring DM (dx'ed 1993; he has mild if any neuropathy of the lower extremities, but he has associated nephropathy).  no cbg record, but states cbg's vary from 120-170.  He continues to lose weight. He has stopped the norvasc. Past Medical History  Diagnosis Date  . COLONIC POLYPS, ADENOMATOUS 08/01/2008  . DIABETES MELLITUS, TYPE I 03/13/2007  . HYPERCHOLESTEROLEMIA 02/03/2008  . ANEMIA 08/15/2009  . Anxiety state, unspecified 12/15/2008  . DEPRESSION 03/13/2007  . HYPERTENSION 03/13/2007  . PULMONARY NODULE 11/24/2008  . GERD 03/13/2007  . TOBACCO ABUSE 11/24/2008  . EMPHYSEMA 11/08/2008  . Prostatism   . ED (erectile dysfunction)   . [name]'s esophagus   . Hiatal hernia   . Diverticulitis   . Arthritis     Past Surgical History  Procedure Laterality Date  . Egd  11/19/2005  . Foot fracture surgery  1980    right foot w/ pins and screws  . Removal pins and screws foot  1980    right foot  . Upper gastrointestinal endoscopy    . Circumcision      History   Social History  . Marital Status: Married    Spouse Name: N/A    Number of Children: N/A  . Years of Education: N/A   Occupational History  . INSPECTOR     works Contractor   Social History Main Topics  . Smoking status: Former Smoker -- 1.00 packs/day for 35 years    Types: Cigarettes    Quit date: 10/31/2008  . Smokeless tobacco: Never Used  . Alcohol Use: No     Comment: 2/month  . Drug Use: No  . Sexual Activity: No   Other Topics Concern  . Not on file   Social History Narrative   Married with children    Current Outpatient Prescriptions on File Prior to Visit  Medication Sig Dispense Refill  . aspirin 81 MG tablet Take 81 mg by mouth daily.        Marland Kitchen atorvastatin (LIPITOR) 10 MG tablet Take 1 tablet (10 mg total) by mouth daily.  90 tablet  3  . diazepam  (VALIUM) 5 MG tablet Take 1 tablet (5 mg total) by mouth 2 (two) times daily as needed.  180 tablet  1  . glucose blood (PRECISION XTRA TEST STRIPS) test strip Check blood sugar three times a day dx 250.01  270 each  3  . insulin glargine (LANTUS) 100 UNIT/ML injection Inject 30 Units into the skin every morning. As directed      . losartan-hydrochlorothiazide (HYZAAR) 100-25 MG per tablet Take 1 tablet by mouth daily.  90 tablet  3  . sildenafil (REVATIO) 20 MG tablet Tab q tab as needed  10 tablet  11  . varenicline (CHANTIX CONTINUING MONTH PAK) 1 MG tablet Take 1 tablet (1 mg total) by mouth 2 (two) times daily.  60 tablet  5  . [DISCONTINUED] metoprolol succinate (TOPROL-XL) 25 MG 24 hr tablet Take 25 mg by mouth daily.        No current facility-administered medications on file prior to visit.    No Known Allergies  Family History  Problem Relation Age of Onset  . Stroke Mother   . Colon cancer Neg Hx    BP 126/78  Pulse 80  Ht 6\' 4"  (1.93 m)  Wt  219 lb (99.338 kg)  BMI 26.67 kg/m2  SpO2 94%  Review of Systems denies hypoglycemia    Objective:   Physical Exam VITAL SIGNS:  See vs page GENERAL: no distress SKIN:  Insulin injection sites at the anterior abdomen are normal.  Lab Results  Component Value Date   HGBA1C 7.8* 04/29/2013      Assessment & Plan:  DM: this is the best control this pt should aim for, given this regimen, which does match insulin to his changing needs throughout the day Weight loss.  This helps control DM.  In fact, he can further reduce the insulin. HTN: well-controlled off norvasc.

## 2013-04-29 NOTE — Patient Instructions (Addendum)
check your blood sugar twice a day.  vary the time of day when you check, between before the 3 meals, and at bedtime.  also check if you have symptoms of your blood sugar being too high or too low.  please keep a record of the readings and bring it to your next appointment here.  please call us sooner if your blood sugar goes below 70, or if you have a lot of readings over 200.    Please come back for a follow-up appointment in 3 months.  blood tests are being requested for you today.  We'll contact you with results.     

## 2013-05-26 ENCOUNTER — Other Ambulatory Visit: Payer: Self-pay | Admitting: Endocrinology

## 2013-06-24 ENCOUNTER — Other Ambulatory Visit: Payer: Self-pay

## 2013-07-23 ENCOUNTER — Ambulatory Visit: Payer: Medicare Other | Admitting: Pulmonary Disease

## 2013-08-03 ENCOUNTER — Ambulatory Visit (INDEPENDENT_AMBULATORY_CARE_PROVIDER_SITE_OTHER): Payer: Medicare Other | Admitting: Endocrinology

## 2013-08-03 ENCOUNTER — Encounter: Payer: Self-pay | Admitting: Endocrinology

## 2013-08-03 VITALS — BP 120/58 | HR 67 | Temp 97.6°F | Ht 76.0 in | Wt 217.0 lb

## 2013-08-03 DIAGNOSIS — E1029 Type 1 diabetes mellitus with other diabetic kidney complication: Secondary | ICD-10-CM | POA: Diagnosis not present

## 2013-08-03 DIAGNOSIS — G619 Inflammatory polyneuropathy, unspecified: Secondary | ICD-10-CM

## 2013-08-03 DIAGNOSIS — Z23 Encounter for immunization: Secondary | ICD-10-CM | POA: Diagnosis not present

## 2013-08-03 LAB — VITAMIN B12: Vitamin B-12: 545 pg/mL (ref 211–911)

## 2013-08-03 MED ORDER — VARENICLINE TARTRATE 1 MG PO TABS: 1.0000 mg | ORAL_TABLET | Freq: Two times a day (BID) | ORAL | Status: DC

## 2013-08-03 NOTE — Progress Notes (Signed)
Subjective:    Patient ID: Ronald Johnson, male    DOB: [birth year], 67 y.o.   MRN: [id number]  HPI Pt returns for f/u of insulin-requiring DM (dx'ed 1993, on a routine blood test; he has mild neuropathy of the lower extremities, and associated nephropathy; he has never had severe hypoglycemia or DKA).  no cbg record, but states cbg's vary from 119-207, but most are in the low to mid-100's.  pt states he feels well in general.  Past Medical History  Diagnosis Date  . COLONIC POLYPS, ADENOMATOUS 08/01/2008  . DIABETES MELLITUS, TYPE I 03/13/2007  . HYPERCHOLESTEROLEMIA 02/03/2008  . ANEMIA 08/15/2009  . Anxiety state, unspecified 12/15/2008  . DEPRESSION 03/13/2007  . HYPERTENSION 03/13/2007  . PULMONARY NODULE 11/24/2008  . GERD 03/13/2007  . TOBACCO ABUSE 11/24/2008  . EMPHYSEMA 11/08/2008  . Prostatism   . ED (erectile dysfunction)   . [name]'s esophagus   . Hiatal hernia   . Diverticulitis   . Arthritis     Past Surgical History  Procedure Laterality Date  . Egd  11/19/2005  . Foot fracture surgery  1980    right foot w/ pins and screws  . Removal pins and screws foot  1980    right foot  . Upper gastrointestinal endoscopy    . Circumcision      History   Social History  . Marital Status: Married    Spouse Name: N/A    Number of Children: N/A  . Years of Education: N/A   Occupational History  . INSPECTOR     works Contractor   Social History Main Topics  . Smoking status: Former Smoker -- 1.00 packs/day for 35 years    Types: Cigarettes    Quit date: 10/31/2008  . Smokeless tobacco: Never Used  . Alcohol Use: No     Comment: 2/month  . Drug Use: No  . Sexual Activity: No   Other Topics Concern  . Not on file   Social History Narrative   Married with children    Current Outpatient Prescriptions on File Prior to Visit  Medication Sig Dispense Refill  . aspirin 81 MG tablet Take 81 mg by mouth daily.        Marland Kitchen atorvastatin (LIPITOR) 10 MG tablet  Take 1 tablet (10 mg total) by mouth daily.  90 tablet  3  . glucose blood (PRECISION XTRA TEST STRIPS) test strip Check blood sugar three times a day dx 250.01  270 each  3  . insulin glargine (LANTUS) 100 UNIT/ML injection Inject 30 Units into the skin every morning. As directed      . losartan-hydrochlorothiazide (HYZAAR) 100-25 MG per tablet Take 1 tablet by mouth daily.  90 tablet  3  . diazepam (VALIUM) 5 MG tablet Take 1 tablet (5 mg total) by mouth 2 (two) times daily as needed.  180 tablet  1  . sildenafil (REVATIO) 20 MG tablet TAKE 1 TABLET BY MOUTH AS NEEDED  50 tablet  1  . [DISCONTINUED] metoprolol succinate (TOPROL-XL) 25 MG 24 hr tablet Take 25 mg by mouth daily.        No current facility-administered medications on file prior to visit.    No Known Allergies  Family History  Problem Relation Age of Onset  . Stroke Mother   . Colon cancer Neg Hx    BP 120/58  Pulse 67  Temp(Src) 97.6 F (36.4 C) (Oral)  Ht 6\' 4"  (1.93 m)  Wt 217 lb (  98.431 kg)  BMI 26.43 kg/m2  SpO2 95%  Review of Systems He denies hypoglycemia.  He has lost a few more lbs    Objective:   Physical Exam VITAL SIGNS:  See vs page GENERAL: no distress  Lab Results  Component Value Date   HGBA1C 7.8* 08/03/2013      Assessment & Plan:  DM: this is the best control this pt should aim for, given this regimen, which does match insulin to his changing needs throughout the day Weight loss.  This helps control DM.  In fact, he may be able to further reduce the insulin with further weight loss.  Neuropathy: this limits exercise rx of DM. Smoker: pt says he wants to try quitting again.

## 2013-08-03 NOTE — Patient Instructions (Addendum)
check your blood sugar twice a day.  vary the time of day when you check, between before the 3 meals, and at bedtime.  also check if you have symptoms of your blood sugar being too high or too low.  please keep a record of the readings and bring it to your next appointment here.  please call us sooner if your blood sugar goes below 70, or if you have a lot of readings over 200.   Please come back for a regular physical appointment in 3 months (after 11/05/13).  blood tests are requested for you today.  We'll contact you with results. i have sent a prescription to your pharmacy, for chantix.

## 2013-08-20 ENCOUNTER — Other Ambulatory Visit: Payer: Self-pay | Admitting: *Deleted

## 2013-08-20 MED ORDER — VARENICLINE TARTRATE 1 MG PO TABS: 1.0000 mg | ORAL_TABLET | Freq: Two times a day (BID) | ORAL | Status: DC

## 2013-08-20 NOTE — Telephone Encounter (Signed)
Pt called and said that his rx for Chantix was sent to his local pharmacy and he would like for [place] to send it to [facility]. Told pt we would make the change.

## 2013-09-08 ENCOUNTER — Ambulatory Visit (INDEPENDENT_AMBULATORY_CARE_PROVIDER_SITE_OTHER): Payer: Medicare Other | Admitting: Pulmonary Disease

## 2013-09-08 ENCOUNTER — Encounter: Payer: Self-pay | Admitting: Pulmonary Disease

## 2013-09-08 VITALS — BP 134/72 | HR 64 | Temp 98.4°F | Ht 76.0 in | Wt 223.0 lb

## 2013-09-08 DIAGNOSIS — R911 Solitary pulmonary nodule: Secondary | ICD-10-CM | POA: Diagnosis not present

## 2013-09-08 DIAGNOSIS — Z23 Encounter for immunization: Secondary | ICD-10-CM | POA: Diagnosis not present

## 2013-09-08 DIAGNOSIS — J984 Other disorders of lung: Secondary | ICD-10-CM | POA: Diagnosis not present

## 2013-09-08 DIAGNOSIS — J438 Other emphysema: Secondary | ICD-10-CM | POA: Diagnosis not present

## 2013-09-08 MED ORDER — PNEUMOCOCCAL 13-VAL CONJ VACC IM SUSP: 0.5000 mL | Freq: Once | INTRAMUSCULAR | Status: DC

## 2013-09-08 NOTE — Progress Notes (Signed)
   Subjective:    Patient ID: Ronald Johnson, male    DOB: 03/10/1946, 68 y.o.   MRN: [id number]  HPI 67/M, ex -smoker for FU of COPD & pulm nodules, stable since 2008.  Adm for LLL pneumococcal pneumonia 3/12010  03/2009 Spirometry >>mild airway obstruction, paradoxical decrease in lung function with albuterol, 'passed out' during maneuver.  CT chest 10/12 >> stable nodules     09/08/2013 23m FU  Chief Complaint  Patient presents with  . Follow-up    Pt reports breathing has been fine. C/o occasiona cough when having PND. No wheezing, no chest tx   10/2012 - RLL nodule from 04/2012 -resolved Multiple small nodules - stable since 2008 (first CT) Lost 70 lbs! Insulin requirement has decreased  Completed pulm rehab  quit smoking since 10/12 , Back on chantix for cravings  Has stopped symbicort & qvar  Concerned about lung nodules CT 10/2012 -RLL GGO noted in 04/2012 -resolved, other nodules stable Requests valium Rx    Review of Systems neg for any significant sore throat, dysphagia, itching, sneezing, nasal congestion or excess/ purulent secretions, fever, chills, sweats, unintended wt loss, pleuritic or exertional cp, hempoptysis, orthopnea pnd or change in chronic leg swelling. Also denies presyncope, palpitations, heartburn, abdominal pain, nausea, vomiting, diarrhea or change in bowel or urinary habits, dysuria,hematuria, rash, arthralgias, visual complaints, headache, numbness weakness or ataxia.     Objective:   Physical Exam  Gen. Pleasant, well-nourished, in no distress ENT - no lesions, no post nasal drip Neck: No JVD, no thyromegaly, no carotid bruits Lungs: no use of accessory muscles, no dullness to percussion, clear without rales or rhonchi  Cardiovascular: Rhythm regular, heart sounds  normal, no murmurs or gallops, no peripheral edema Musculoskeletal: No deformities, no cyanosis or clubbing        Assessment & Plan:

## 2013-09-08 NOTE — Patient Instructions (Signed)
CT chest in march 2015 Prevnar Call us as needed Valium Rx from PCP

## 2013-09-09 NOTE — Assessment & Plan Note (Signed)
1 yr FU CT chest in march 2015 If stable , no further FU required Reassured about benign nature

## 2013-09-09 NOTE — Assessment & Plan Note (Signed)
Prevnar Call us as needed Valium Rx from PCP

## 2013-09-20 ENCOUNTER — Telehealth: Payer: Self-pay

## 2013-09-20 MED ORDER — LOSARTAN POTASSIUM-HCTZ 100-25 MG PO TABS: 1.0000 | ORAL_TABLET | Freq: Every day | ORAL | Status: DC

## 2013-09-20 NOTE — Telephone Encounter (Signed)
Options:  i can do 09/22/13, or: Other dr can do today.

## 2013-09-20 NOTE — Telephone Encounter (Signed)
I can sign

## 2013-09-20 NOTE — Telephone Encounter (Signed)
Pt called requesting valium refill requested a refill good for 90 days. Ok to do so and have Provider at office sign. Please advise,  Thanks!

## 2013-09-20 NOTE — Telephone Encounter (Signed)
Please see below and advise during Dr. Cordelia Pen absence.  Thanks!

## 2013-09-21 MED ORDER — DIAZEPAM 5 MG PO TABS: 5.0000 mg | ORAL_TABLET | Freq: Two times a day (BID) | ORAL | Status: DC | PRN

## 2013-09-21 MED ORDER — LOSARTAN POTASSIUM-HCTZ 100-25 MG PO TABS: 1.0000 | ORAL_TABLET | Freq: Every day | ORAL | Status: DC

## 2013-09-21 NOTE — Telephone Encounter (Signed)
Script faxed to [facility].

## 2013-11-02 ENCOUNTER — Ambulatory Visit (INDEPENDENT_AMBULATORY_CARE_PROVIDER_SITE_OTHER)
Admission: RE | Admit: 2013-11-02 | Discharge: 2013-11-02 | Disposition: A | Discharge status: Home / Self Care | Payer: Medicare Other | Source: Ambulatory Visit | Attending: Pulmonary Disease | Admitting: Pulmonary Disease

## 2013-11-02 DIAGNOSIS — R911 Solitary pulmonary nodule: Secondary | ICD-10-CM | POA: Diagnosis not present

## 2013-11-02 DIAGNOSIS — J438 Other emphysema: Secondary | ICD-10-CM | POA: Diagnosis not present

## 2013-11-04 ENCOUNTER — Ambulatory Visit (INDEPENDENT_AMBULATORY_CARE_PROVIDER_SITE_OTHER): Payer: Medicare Other | Admitting: Endocrinology

## 2013-11-04 ENCOUNTER — Other Ambulatory Visit (INDEPENDENT_AMBULATORY_CARE_PROVIDER_SITE_OTHER): Payer: Medicare Other

## 2013-11-04 ENCOUNTER — Encounter: Payer: Self-pay | Admitting: Endocrinology

## 2013-11-04 VITALS — BP 110/60 | HR 80 | Temp 98.0°F | Ht 76.0 in | Wt 223.0 lb

## 2013-11-04 DIAGNOSIS — Z0000 Encounter for general adult medical examination without abnormal findings: Secondary | ICD-10-CM

## 2013-11-04 DIAGNOSIS — E7800 Pure hypercholesterolemia, unspecified: Secondary | ICD-10-CM

## 2013-11-04 DIAGNOSIS — E1029 Type 1 diabetes mellitus with other diabetic kidney complication: Secondary | ICD-10-CM | POA: Diagnosis not present

## 2013-11-04 DIAGNOSIS — D649 Anemia, unspecified: Secondary | ICD-10-CM | POA: Diagnosis not present

## 2013-11-04 DIAGNOSIS — Z79899 Other long term (current) drug therapy: Secondary | ICD-10-CM

## 2013-11-04 DIAGNOSIS — Z789 Other specified health status: Secondary | ICD-10-CM | POA: Diagnosis not present

## 2013-11-04 DIAGNOSIS — R809 Proteinuria, unspecified: Secondary | ICD-10-CM | POA: Diagnosis not present

## 2013-11-04 DIAGNOSIS — Z125 Encounter for screening for malignant neoplasm of prostate: Secondary | ICD-10-CM | POA: Diagnosis not present

## 2013-11-04 DIAGNOSIS — E119 Type 2 diabetes mellitus without complications: Secondary | ICD-10-CM | POA: Diagnosis not present

## 2013-11-04 DIAGNOSIS — I10 Essential (primary) hypertension: Secondary | ICD-10-CM

## 2013-11-04 DIAGNOSIS — Z87891 Personal history of nicotine dependence: Secondary | ICD-10-CM

## 2013-11-04 LAB — CBC WITH DIFFERENTIAL/PLATELET
BASOS ABS: 0 10*3/uL (ref 0.0–0.1)
Basophils Relative: 0.5 % (ref 0.0–3.0)
EOS ABS: 0.3 10*3/uL (ref 0.0–0.7)
Eosinophils Relative: 5 % (ref 0.0–5.0)
HEMATOCRIT: 36.6 % — AB (ref 39.0–52.0)
HEMOGLOBIN: 12.5 g/dL — AB (ref 13.0–17.0)
LYMPHS ABS: 1.7 10*3/uL (ref 0.7–4.0)
Lymphocytes Relative: 27.9 % (ref 12.0–46.0)
MCHC: 34.3 g/dL (ref 30.0–36.0)
MCV: 95 fl (ref 78.0–100.0)
Monocytes Absolute: 0.3 10*3/uL (ref 0.1–1.0)
Monocytes Relative: 5.6 % (ref 3.0–12.0)
Neutro Abs: 3.6 10*3/uL (ref 1.4–7.7)
Neutrophils Relative %: 61 % (ref 43.0–77.0)
PLATELETS: 168 10*3/uL (ref 150.0–400.0)
RBC: 3.85 Mil/uL — ABNORMAL LOW (ref 4.22–5.81)
RDW: 12.7 % (ref 11.5–14.6)
WBC: 6 10*3/uL (ref 4.5–10.5)

## 2013-11-04 LAB — HEPATIC FUNCTION PANEL
ALBUMIN: 4.2 g/dL (ref 3.5–5.2)
ALT: 17 U/L (ref 0–53)
AST: 19 U/L (ref 0–37)
Alkaline Phosphatase: 66 U/L (ref 39–117)
Bilirubin, Direct: 0.1 mg/dL (ref 0.0–0.3)
Total Bilirubin: 0.6 mg/dL (ref 0.3–1.2)
Total Protein: 6.8 g/dL (ref 6.0–8.3)

## 2013-11-04 LAB — BASIC METABOLIC PANEL
BUN: 23 mg/dL (ref 6–23)
CO2: 28 meq/L (ref 19–32)
Calcium: 9.1 mg/dL (ref 8.4–10.5)
Chloride: 104 mEq/L (ref 96–112)
Creatinine, Ser: 1.4 mg/dL (ref 0.4–1.5)
GFR: 54.87 mL/min — ABNORMAL LOW (ref >60.00)
GLUCOSE: 226 mg/dL — AB (ref 70–99)
Potassium: 4.4 mEq/L (ref 3.5–5.1)
Sodium: 140 mEq/L (ref 135–145)

## 2013-11-04 LAB — URINALYSIS, ROUTINE W REFLEX MICROSCOPIC
BILIRUBIN URINE: NEGATIVE
HGB URINE DIPSTICK: NEGATIVE
Ketones, ur: NEGATIVE
LEUKOCYTES UA: NEGATIVE
Nitrite: NEGATIVE
PH: 6.5 (ref 5.0–8.0)
RBC / HPF: NONE SEEN (ref 0–?)
Specific Gravity, Urine: 1.015 (ref 1.000–1.030)
Total Protein, Urine: NEGATIVE
UROBILINOGEN UA: 0.2 (ref 0.0–1.0)
Urine Glucose: NEGATIVE
WBC, UA: NONE SEEN (ref 0–?)

## 2013-11-04 LAB — IBC PANEL
Iron: 87 ug/dL (ref 42–165)
Saturation Ratios: 26.8 % (ref 20.0–50.0)
Transferrin: 231.6 mg/dL (ref 212.0–360.0)

## 2013-11-04 LAB — LIPID PANEL
Cholesterol: 148 mg/dL (ref 0–200)
HDL: 41.1 mg/dL (ref >39.00)
LDL CALC: 73 mg/dL (ref 0–99)
Total CHOL/HDL Ratio: 4
Triglycerides: 172 mg/dL — ABNORMAL HIGH (ref 0.0–149.0)
VLDL: 34.4 mg/dL (ref 0.0–40.0)

## 2013-11-04 LAB — MICROALBUMIN / CREATININE URINE RATIO
Creatinine,U: 103.6 mg/dL
Microalb Creat Ratio: 10.7 mg/g (ref 0.0–30.0)
Microalb, Ur: 11.1 mg/dL — ABNORMAL HIGH (ref 0.0–1.9)

## 2013-11-04 LAB — HEMOGLOBIN A1C: Hgb A1c MFr Bld: 8.7 % — ABNORMAL HIGH (ref 4.6–6.5)

## 2013-11-04 LAB — PSA, MEDICARE: PSA: 0.93 ng/mL (ref 0.10–4.00)

## 2013-11-04 LAB — TSH: TSH: 1.67 u[IU]/mL (ref 0.35–5.50)

## 2013-11-04 MED ORDER — LOSARTAN POTASSIUM-HCTZ 100-12.5 MG PO TABS
1.0000 | ORAL_TABLET | Freq: Every day | ORAL | Status: DC
Start: 2013-11-04 — End: 2014-11-11

## 2013-11-04 NOTE — Patient Instructions (Addendum)
check your blood sugar twice a day.  vary the time of day when you check, between before the 3 meals, and at bedtime.  also check if you have symptoms of your blood sugar being too high or too low.  please keep a record of the readings and bring it to your next appointment here.  please call us sooner if your blood sugar goes below 70, or if you have a lot of readings over 200.   Please come back for a follow-up appointment in 4 months.    blood tests are requested for you today.  We'll contact you with results.  Please reduce the losartan-hctz.  i have sent new prescriptions to your pharmacy.  On days when you are active, take just 15 units of lantus.  Please call if this does not eliminate the lows.  please consider these measures for your health:  minimize alcohol.  do not use tobacco products.  have a colonoscopy at least every 10 years from age 3.  keep firearms safely stored.  always use seat belts.  have working smoke alarms in your home.  see an eye doctor and dentist regularly.  never drive under the influence of alcohol or drugs (including prescription drugs).  those with fair skin should take precautions against the sun. it is critically important to prevent falling down (keep floor areas well-lit, dry, and free of loose objects.  If you have a cane, walker, or wheelchair, you should use it, even for short trips around the house.  Also, try not to rush).  blood tests are being requested for you today.  We'll contact you with results.

## 2013-11-04 NOTE — Progress Notes (Signed)
Subjective:    Patient ID: Ronald Johnson, male    DOB: 05/05/1946, 68 y.o.   MRN: [id number]  HPI Pt returns for f/u of insulin-requiring DM (dx'ed 1993, on a routine blood test; he has mild neuropathy of the lower extremities, and associated nephropathy; he has never had severe hypoglycemia or DKA; he changed to qd lantus, after he did not achieve good control on multiple daily injections).  no cbg record, but states cbg's vary from 70-250.  It is in general lowest in the middle of the night.  cbg is much lower on days when he is active.     Past Medical History  Diagnosis Date  . COLONIC POLYPS, ADENOMATOUS 08/01/2008  . DIABETES MELLITUS, TYPE I 03/13/2007  . HYPERCHOLESTEROLEMIA 02/03/2008  . ANEMIA 08/15/2009  . Anxiety state, unspecified 12/15/2008  . DEPRESSION 03/13/2007  . HYPERTENSION 03/13/2007  . PULMONARY NODULE 11/24/2008  . GERD 03/13/2007  . TOBACCO ABUSE 11/24/2008  . EMPHYSEMA 11/08/2008  . Prostatism   . ED (erectile dysfunction)   . [name]'s esophagus   . Hiatal hernia   . Diverticulitis   . Arthritis     Past Surgical History  Procedure Laterality Date  . Egd  11/19/2005  . Foot fracture surgery  1980    right foot w/ pins and screws  . Removal pins and screws foot  1980    right foot  . Upper gastrointestinal endoscopy    . Circumcision      History   Social History  . Marital Status: Married    Spouse Name: N/A    Number of Children: N/A  . Years of Education: N/A   Occupational History  . INSPECTOR     works Youth worker   Social History Main Topics  . Smoking status: Former Smoker -- 1.00 packs/day for 35 years    Types: Cigarettes    Quit date: 10/31/2008  . Smokeless tobacco: Never Used  . Alcohol Use: No     Comment: 2/month  . Drug Use: No  . Sexual Activity: No   Other Topics Concern  . Not on file   Social History Narrative   Married with children    Current Outpatient Prescriptions on File Prior to Visit  Medication  Sig Dispense Refill  . aspirin 81 MG tablet Take 81 mg by mouth daily.        Marland Kitchen atorvastatin (LIPITOR) 10 MG tablet Take 1 tablet (10 mg total) by mouth daily.  90 tablet  3  . diazepam (VALIUM) 5 MG tablet Take 1 tablet (5 mg total) by mouth 2 (two) times daily as needed.  180 tablet  1  . glucose blood (PRECISION XTRA TEST STRIPS) test strip Check blood sugar three times a day dx 250.01  270 each  3  . insulin glargine (LANTUS) 100 UNIT/ML injection Inject 40 Units into the skin every morning. As directed      . sildenafil (REVATIO) 20 MG tablet TAKE 1 TABLET BY MOUTH AS NEEDED  50 tablet  1  . varenicline (CHANTIX CONTINUING MONTH PAK) 1 MG tablet Take 1 tablet (1 mg total) by mouth 2 (two) times daily.  180 tablet  1  . [DISCONTINUED] metoprolol succinate (TOPROL-XL) 25 MG 24 hr tablet Take 25 mg by mouth daily.        No current facility-administered medications on file prior to visit.    No Known Allergies  Family History  Problem Relation Age of Onset  .  Stroke Mother   . Colon cancer Neg Hx     BP 110/60  Pulse 80  Temp(Src) 98 F (36.7 C) (Oral)  Ht 6\' 4"  (1.93 m)  Wt 223 lb (101.152 kg)  BMI 27.16 kg/m2  SpO2 96%   Review of Systems His weight-loss has leveled off.      Objective:   Physical Exam VITAL SIGNS:  See vs page GENERAL: no distress   Lab Results  Component Value Date   WBC 6.0 11/04/2013   HGB 12.5* 11/04/2013   HCT 36.6* 11/04/2013   PLT 168.0 11/04/2013   GLUCOSE 226* 11/04/2013   CHOL 148 11/04/2013   TRIG 172.0* 11/04/2013   HDL 41.10 11/04/2013   LDLDIRECT 39.4 07/25/2011   LDLCALC 73 11/04/2013   ALT 17 11/04/2013   AST 19 11/04/2013   NA 140 11/04/2013   K 4.4 11/04/2013   CL 104 11/04/2013   CREATININE 1.4 11/04/2013   BUN 23 11/04/2013   CO2 28 11/04/2013   TSH 1.67 11/04/2013   PSA 0.93 11/04/2013   INR 1.16 09/10/2011   HGBA1C 8.7* 11/04/2013   MICROALBUR 11.1* 11/04/2013      Assessment & Plan:  DM: rx limited by variable activity.  He'll  need to vary his dosage based on anticipated activity. Dyslipidemia: well-controlled. HTN: well-controlled.   Subjective:   Patient here for Medicare annual wellness visit and management of other chronic and acute problems.     Risk factors: advanced age    23 of Physicians Providing Medical Care to Patient:  See "snapshot"   Activities of Daily Living: In your present state of health, do you have any difficulty performing the following activities?:  Preparing food and eating?: No  Bathing yourself: No  Getting dressed: No  Using the toilet:No  Moving around from place to place: No  In the past year have you fallen or had a near fall?:No    Home Safety: Has smoke detector and wears seat belts. No firearms. No excess sun exposure.  Diet and Exercise  Current exercise habits: pt says good. Dietary issues discussed: pt reports a healthy diet   Depression Screen  Q1: Over the past two weeks, have you felt down, depressed or hopeless? no  Q2: Over the past two weeks, have you felt little interest or pleasure in doing things? no   The following portions of the patient's history were reviewed and updated as appropriate: allergies, current medications, past family history, past medical history, past social history, past surgical history and problem list.  Past Medical History  Diagnosis Date  . COLONIC POLYPS, ADENOMATOUS 08/01/2008  . DIABETES MELLITUS, TYPE I 03/13/2007  . HYPERCHOLESTEROLEMIA 02/03/2008  . ANEMIA 08/15/2009  . Anxiety state, unspecified 12/15/2008  . DEPRESSION 03/13/2007  . HYPERTENSION 03/13/2007  . PULMONARY NODULE 11/24/2008  . GERD 03/13/2007  . TOBACCO ABUSE 11/24/2008  . EMPHYSEMA 11/08/2008  . Prostatism   . ED (erectile dysfunction)   . [name]'s esophagus   . Hiatal hernia   . Diverticulitis   . Arthritis     Past Surgical History  Procedure Laterality Date  . Egd  11/19/2005  . Foot fracture surgery  1980    right foot w/ pins and screws  .  Removal pins and screws foot  1980    right foot  . Upper gastrointestinal endoscopy    . Circumcision      History   Social History  . Marital Status: Married    Spouse Name:  N/A    Number of Children: N/A  . Years of Education: N/A   Occupational History  . INSPECTOR     works Youth worker   Social History Main Topics  . Smoking status: Former Smoker -- 1.00 packs/day for 35 years    Types: Cigarettes    Quit date: 10/31/2008  . Smokeless tobacco: Never Used  . Alcohol Use: No     Comment: 2/month  . Drug Use: No  . Sexual Activity: No   Other Topics Concern  . Not on file   Social History Narrative   Married with children    Current Outpatient Prescriptions on File Prior to Visit  Medication Sig Dispense Refill  . aspirin 81 MG tablet Take 81 mg by mouth daily.        Marland Kitchen atorvastatin (LIPITOR) 10 MG tablet Take 1 tablet (10 mg total) by mouth daily.  90 tablet  3  . diazepam (VALIUM) 5 MG tablet Take 1 tablet (5 mg total) by mouth 2 (two) times daily as needed.  180 tablet  1  . glucose blood (PRECISION XTRA TEST STRIPS) test strip Check blood sugar three times a day dx 250.01  270 each  3  . insulin glargine (LANTUS) 100 UNIT/ML injection Inject 30 Units into the skin every morning. As directed      . sildenafil (REVATIO) 20 MG tablet TAKE 1 TABLET BY MOUTH AS NEEDED  50 tablet  1  . varenicline (CHANTIX CONTINUING MONTH PAK) 1 MG tablet Take 1 tablet (1 mg total) by mouth 2 (two) times daily.  180 tablet  1  . [DISCONTINUED] metoprolol succinate (TOPROL-XL) 25 MG 24 hr tablet Take 25 mg by mouth daily.        No current facility-administered medications on file prior to visit.    No Known Allergies  Family History  Problem Relation Age of Onset  . Stroke Mother   . Colon cancer Neg Hx     BP 110/60  Pulse 80  Temp(Src) 98 F (36.7 C) (Oral)  Ht 6\' 4"  (1.93 m)  Wt 223 lb (101.152 kg)  BMI 27.16 kg/m2  SpO2 96%   Review of Systems  Denies  hearing loss, and visual loss Objective:   Vision:  Sees opthalmologist Hearing: grossly normal Body mass index:  See vs page Msk: pt easily and quickly performs "get-up-and-go" from a sitting position Cognitive Impairment Assessment: cognition, memory and judgment appear normal.  remembers 3/3 at 5 minutes.  excellent recall.  can easily read and write a sentence.  alert and oriented x 3   Assessment:   Medicare wellness utd on preventive parameters    Plan:   During the course of the visit the patient was educated and counseled about appropriate screening and preventive services including:        Fall prevention    Diabetes screening  Nutrition counseling   Vaccines / LABS Zostavax / Pneumococcal Vaccine  today  PSA  Patient Instructions (the written plan) was given to the patient.   we discussed code status.  pt requests full code, but would not want to be started or maintained on artificial life-support measures if there was not a reasonable chance of recovery

## 2013-12-17 ENCOUNTER — Other Ambulatory Visit: Payer: Self-pay | Admitting: Endocrinology

## 2014-01-30 ENCOUNTER — Other Ambulatory Visit: Payer: Self-pay | Admitting: Endocrinology

## 2014-03-02 ENCOUNTER — Other Ambulatory Visit: Payer: Self-pay | Admitting: Endocrinology

## 2014-03-08 ENCOUNTER — Ambulatory Visit (INDEPENDENT_AMBULATORY_CARE_PROVIDER_SITE_OTHER): Payer: Medicare Other | Admitting: Endocrinology

## 2014-03-08 ENCOUNTER — Encounter: Payer: Self-pay | Admitting: Endocrinology

## 2014-03-08 VITALS — BP 118/60 | HR 88 | Temp 98.5°F | Ht 76.0 in | Wt 228.0 lb

## 2014-03-08 DIAGNOSIS — E1029 Type 1 diabetes mellitus with other diabetic kidney complication: Secondary | ICD-10-CM | POA: Diagnosis not present

## 2014-03-08 LAB — HEMOGLOBIN A1C: Hgb A1c MFr Bld: 8.9 % — ABNORMAL HIGH (ref 4.6–6.5)

## 2014-03-08 MED ORDER — VARENICLINE TARTRATE 1 MG PO TABS: ORAL_TABLET | ORAL | Status: DC

## 2014-03-08 NOTE — Progress Notes (Signed)
Subjective:    Patient ID: Ronald Johnson, male    DOB: 04-Aug-1946, 68 y.o.   MRN: [id number]  HPI Pt returns for f/u of insulin-requiring DM (dx'ed 1993, on a routine blood test; he has mild neuropathy of the lower extremities, and associated nephropathy; he has never had pancreatitis, severe hypoglycemia, or DKA; he has been on insulin since 2004; he changed to qd lantus, after he did not achieve good control on multiple daily injections).  He takes 30 units qd.  no cbg record, but states cbg's vary from 94-146.  There is no trend throughout the day.   He has quit smoking, but he wants to continue chantix, to stay quit.   Past Medical History  Diagnosis Date  . COLONIC POLYPS, ADENOMATOUS 08/01/2008  . DIABETES MELLITUS, TYPE I 03/13/2007  . HYPERCHOLESTEROLEMIA 02/03/2008  . ANEMIA 08/15/2009  . Anxiety state, unspecified 12/15/2008  . DEPRESSION 03/13/2007  . HYPERTENSION 03/13/2007  . PULMONARY NODULE 11/24/2008  . GERD 03/13/2007  . TOBACCO ABUSE 11/24/2008  . EMPHYSEMA 11/08/2008  . Prostatism   . ED (erectile dysfunction)   . [name]'s esophagus   . Hiatal hernia   . Diverticulitis   . Arthritis     Past Surgical History  Procedure Laterality Date  . Egd  11/19/2005  . Foot fracture surgery  1980    right foot w/ pins and screws  . Removal pins and screws foot  1980    right foot  . Upper gastrointestinal endoscopy    . Circumcision      History   Social History  . Marital Status: Married    Spouse Name: N/A    Number of Children: N/A  . Years of Education: N/A   Occupational History  . INSPECTOR     works Youth worker   Social History Main Topics  . Smoking status: Former Smoker -- 1.00 packs/day for 35 years    Types: Cigarettes    Quit date: 10/31/2008  . Smokeless tobacco: Never Used  . Alcohol Use: No     Comment: 2/month  . Drug Use: No  . Sexual Activity: No   Other Topics Concern  . Not on file   Social History Narrative   Married with  children    Current Outpatient Prescriptions on File Prior to Visit  Medication Sig Dispense Refill  . aspirin 81 MG tablet Take 81 mg by mouth daily.        Marland Kitchen atorvastatin (LIPITOR) 10 MG tablet TAKE ONE TABLET BY MOUTH EVERY DAY  180 tablet  1  . diazepam (VALIUM) 5 MG tablet Take 1 tablet (5 mg total) by mouth 2 (two) times daily as needed.  180 tablet  1  . glucose blood (PRECISION XTRA TEST STRIPS) test strip Check blood sugar three times a day dx 250.01  270 each  3  . insulin glargine (LANTUS) 100 UNIT/ML injection Inject 45 Units into the skin every morning. As directed      . losartan-hydrochlorothiazide (HYZAAR) 100-12.5 MG per tablet Take 1 tablet by mouth daily.  90 tablet  3  . sildenafil (REVATIO) 20 MG tablet TAKE 1 TABLET BY MOUTH AS NEEDED  50 tablet  1  . [DISCONTINUED] metoprolol succinate (TOPROL-XL) 25 MG 24 hr tablet Take 25 mg by mouth daily.        No current facility-administered medications on file prior to visit.    No Known Allergies  Family History  Problem Relation Age of Onset  .  Stroke Mother   . Colon cancer Neg Hx     BP 118/60  Pulse 88  Temp(Src) 98.5 F (36.9 C) (Oral)  Ht 6\' 4"  (1.93 m)  Wt 228 lb (103.42 kg)  BMI 27.76 kg/m2  SpO2 95%   Review of Systems Denies weight change and hypoglycemia.      Objective:   Physical Exam Pulses: dorsalis pedis intact bilat.   Feet: no deformity. normal color and temp.  no edema.   Skin:  no ulcer on the feet.   Neuro: sensation is intact to touch on the feet.    Lab Results  Component Value Date   HGBA1C 8.9* 03/08/2014       Assessment & Plan:  DM: moderate exacerbation. Smoker, quit; however, pt says he is at risk for resuming, so he wants to stay-on chantix--i said ok.   Patient is advised the following: Patient Instructions  check your blood sugar twice a day.  vary the time of day when you check, between before the 3 meals, and at bedtime.  also check if you have symptoms of your  blood sugar being too high or too low.  please keep a record of the readings and bring it to your next appointment here.  please call us sooner if your blood sugar goes below 70, or if you have a lot of readings over 200.   Please come back for a follow-up appointment in 4 months.    On days when you are active, take just 15 units of lantus.   blood tests are being requested for you today.  We'll contact you with results.

## 2014-03-08 NOTE — Patient Instructions (Addendum)
check your blood sugar twice a day.  vary the time of day when you check, between before the 3 meals, and at bedtime.  also check if you have symptoms of your blood sugar being too high or too low.  please keep a record of the readings and bring it to your next appointment here.  please call us sooner if your blood sugar goes below 70, or if you have a lot of readings over 200.   Please come back for a follow-up appointment in 4 months.    On days when you are active, take just 15 units of lantus.   blood tests are being requested for you today.  We'll contact you with results.

## 2014-03-12 ENCOUNTER — Other Ambulatory Visit: Payer: Self-pay | Admitting: Endocrinology

## 2014-03-14 ENCOUNTER — Other Ambulatory Visit: Payer: Self-pay

## 2014-03-14 MED ORDER — ATORVASTATIN CALCIUM 10 MG PO TABS: ORAL_TABLET | ORAL | Status: DC

## 2014-03-20 ENCOUNTER — Other Ambulatory Visit: Payer: Self-pay | Admitting: Endocrinology

## 2014-04-18 ENCOUNTER — Other Ambulatory Visit: Payer: Self-pay | Admitting: Endocrinology

## 2014-04-27 ENCOUNTER — Telehealth: Payer: Self-pay | Admitting: Endocrinology

## 2014-04-27 MED ORDER — INSULIN GLARGINE 100 UNIT/ML SOLOSTAR PEN: PEN_INJECTOR | SUBCUTANEOUS | Status: DC

## 2014-04-27 NOTE — Telephone Encounter (Signed)
Patient would like his lantus refilled  Please send to [facility]  Thank you

## 2014-04-27 NOTE — Telephone Encounter (Signed)
Rx sent to pharmacy per pt's request.  

## 2014-06-06 ENCOUNTER — Telehealth: Payer: Self-pay

## 2014-06-06 NOTE — Telephone Encounter (Signed)
Received a refill request for Diazepam. rx was last refilled on 09/21/2013 and pt was last seen on 03/08/2014. Thanks!

## 2014-06-14 ENCOUNTER — Telehealth: Payer: Self-pay | Admitting: Endocrinology

## 2014-06-14 ENCOUNTER — Other Ambulatory Visit: Payer: Self-pay | Admitting: Endocrinology

## 2014-06-14 MED ORDER — DIAZEPAM 5 MG PO TABS: 5.0000 mg | ORAL_TABLET | Freq: Two times a day (BID) | ORAL | Status: DC | PRN

## 2014-06-14 NOTE — Telephone Encounter (Signed)
See below and please advised pt was last seen on 7/21 and medication was last refilled on 2/3.  Thanks!

## 2014-06-14 NOTE — Telephone Encounter (Signed)
Rx sent to pharmacy   

## 2014-06-14 NOTE — Telephone Encounter (Signed)
i printed 

## 2014-06-14 NOTE — Telephone Encounter (Signed)
Patient is in the process of changing pharmacy to [facility]  Please send diazipam rx to [facility]   Thank you

## 2014-06-17 ENCOUNTER — Encounter: Payer: Self-pay | Admitting: Endocrinology

## 2014-06-17 ENCOUNTER — Ambulatory Visit (INDEPENDENT_AMBULATORY_CARE_PROVIDER_SITE_OTHER): Payer: Medicare Other | Admitting: Endocrinology

## 2014-06-17 VITALS — BP 136/80 | HR 66 | Temp 97.7°F | Ht 76.0 in | Wt 237.0 lb

## 2014-06-17 DIAGNOSIS — N189 Chronic kidney disease, unspecified: Secondary | ICD-10-CM

## 2014-06-17 DIAGNOSIS — E1022 Type 1 diabetes mellitus with diabetic chronic kidney disease: Secondary | ICD-10-CM | POA: Diagnosis not present

## 2014-06-17 LAB — LIPID PANEL
Cholesterol: 156 mg/dL (ref 0–200)
HDL: 33.3 mg/dL — ABNORMAL LOW (ref >39.00)
LDL Cholesterol: 94 mg/dL (ref 0–99)
NonHDL: 122.7
Total CHOL/HDL Ratio: 5
Triglycerides: 143 mg/dL (ref 0.0–149.0)
VLDL: 28.6 mg/dL (ref 0.0–40.0)

## 2014-06-17 LAB — BASIC METABOLIC PANEL
BUN: 24 mg/dL — ABNORMAL HIGH (ref 6–23)
CHLORIDE: 105 meq/L (ref 96–112)
CO2: 25 mEq/L (ref 19–32)
Calcium: 9.3 mg/dL (ref 8.4–10.5)
Creatinine, Ser: 1.1 mg/dL (ref 0.4–1.5)
GFR: 68.98 mL/min (ref >60.00)
GLUCOSE: 272 mg/dL — AB (ref 70–99)
POTASSIUM: 4.5 meq/L (ref 3.5–5.1)
Sodium: 136 mEq/L (ref 135–145)

## 2014-06-17 LAB — HEMOGLOBIN A1C: HEMOGLOBIN A1C: 9.4 % — AB (ref 4.6–6.5)

## 2014-06-17 MED ORDER — INSULIN NPH (HUMAN) (ISOPHANE) 100 UNIT/ML ~~LOC~~ SUSP: 100.0000 [IU] | SUBCUTANEOUS | Status: DC

## 2014-06-17 MED ORDER — GLUCOSE BLOOD VI STRP: ORAL_STRIP | Status: AC

## 2014-06-17 NOTE — Patient Instructions (Addendum)
check your blood sugar twice a day.  vary the time of day when you check, between before the 3 meals, and at bedtime.  also check if you have symptoms of your blood sugar being too high or too low.  please keep a record of the readings and bring it to your next appointment here.  please call us sooner if your blood sugar goes below 70, or if you have a lot of readings over 200.   Please come back for a follow-up appointment in 4 months.    Please change the lantus to NPH, 100 units each morning.  blood tests are being requested for you today.  We'll contact you with results.

## 2014-06-17 NOTE — Progress Notes (Signed)
Subjective:    Patient ID: Ronald Johnson, male    DOB: March 23, 1946, 68 y.o.   MRN: [id number]  HPI Pt returns for f/u of diabetes mellitus: DM type: Insulin-requiring type 2 Dx'ed: 3846 Complications: polyneuropathy and nephropathy Therapy: insulin since 2004 DKA: never Severe hypoglycemia: never Pancreatitis: never Other: he changed to qd lantus, after he did not achieve good control on multiple daily injections Interval history:  Despite increasing lantus to 80 units qd, cbg's continue to be in the 200's.  he brings a record of his cbg's which i have reviewed today.  He denies hypoglycemia.  pt states he feels well in general.  He says he can no longer afford the lantus.   Past Medical History  Diagnosis Date  . COLONIC POLYPS, ADENOMATOUS 08/01/2008  . DIABETES MELLITUS, TYPE I 03/13/2007  . HYPERCHOLESTEROLEMIA 02/03/2008  . ANEMIA 08/15/2009  . Anxiety state, unspecified 12/15/2008  . DEPRESSION 03/13/2007  . HYPERTENSION 03/13/2007  . PULMONARY NODULE 11/24/2008  . GERD 03/13/2007  . TOBACCO ABUSE 11/24/2008  . EMPHYSEMA 11/08/2008  . Prostatism   . ED (erectile dysfunction)   . [name]'s esophagus   . Hiatal hernia   . Diverticulitis   . Arthritis     Past Surgical History  Procedure Laterality Date  . Egd  11/19/2005  . Foot fracture surgery  1980    right foot w/ pins and screws  . Removal pins and screws foot  1980    right foot  . Upper gastrointestinal endoscopy    . Circumcision      History   Social History  . Marital Status: Married    Spouse Name: N/A    Number of Children: N/A  . Years of Education: N/A   Occupational History  . INSPECTOR     works Youth worker   Social History Main Topics  . Smoking status: Former Smoker -- 1.00 packs/day for 35 years    Types: Cigarettes    Quit date: 10/31/2008  . Smokeless tobacco: Never Used  . Alcohol Use: No     Comment: 2/month  . Drug Use: No  . Sexual Activity: No   Other Topics Concern  .  Not on file   Social History Narrative   Married with children    Current Outpatient Prescriptions on File Prior to Visit  Medication Sig Dispense Refill  . aspirin 81 MG tablet Take 81 mg by mouth daily.        Marland Kitchen atorvastatin (LIPITOR) 10 MG tablet TAKE ONE TABLET BY MOUTH EVERY DAY  180 tablet  1  . CHANTIX 1 MG tablet Take 1 tablet by mouth  twice a day  60 tablet  3  . diazepam (VALIUM) 5 MG tablet Take 1 tablet (5 mg total) by mouth 2 (two) times daily as needed.  180 tablet  1  . losartan-hydrochlorothiazide (HYZAAR) 100-12.5 MG per tablet Take 1 tablet by mouth daily.  90 tablet  3  . sildenafil (REVATIO) 20 MG tablet TAKE 1-5 TABLETS BY MOUTH ONCE DAILY AS NEEDED  50 tablet  1  . [DISCONTINUED] metoprolol succinate (TOPROL-XL) 25 MG 24 hr tablet Take 25 mg by mouth daily.        No current facility-administered medications on file prior to visit.    No Known Allergies  Family History  Problem Relation Age of Onset  . Stroke Mother   . Colon cancer Neg Hx     BP 136/80  Pulse 66  Temp(Src)  97.7 F (36.5 C) (Oral)  Ht 6\' 4"  (1.93 m)  Wt 237 lb (107.502 kg)  BMI 28.86 kg/m2  SpO2 94%  Review of Systems He has gained weight.  Denies n/v.      Objective:   Physical Exam VITAL SIGNS:  See vs page.   GENERAL: no distress. Pulses: dorsalis pedis intact bilat.   Feet: no deformity.  no edema.  Skin:  no ulcer on the feet.  normal color and temp. Neuro: sensation is intact to touch on the feet.    Lab Results  Component Value Date   HGBA1C 9.4* 06/17/2014       Assessment & Plan:  HTN: well-controlled.   DM: severe exacerbation.  Weight gain: i encouraged pt to re-lose.     Patient is advised the following: Patient Instructions  check your blood sugar twice a day.  vary the time of day when you check, between before the 3 meals, and at bedtime.  also check if you have symptoms of your blood sugar being too high or too low.  please keep a record of the  readings and bring it to your next appointment here.  please call us sooner if your blood sugar goes below 70, or if you have a lot of readings over 200.   Please come back for a follow-up appointment in 4 months.    Please change the lantus to NPH, 100 units each morning.  blood tests are being requested for you today.  We'll contact you with results.

## 2014-07-07 ENCOUNTER — Ambulatory Visit: Payer: Medicare Other | Admitting: Endocrinology

## 2014-08-03 ENCOUNTER — Telehealth: Payer: Self-pay | Admitting: Endocrinology

## 2014-08-03 NOTE — Telephone Encounter (Signed)
See below and please advise. Pt is taking 100 units daily.  Thanks!

## 2014-08-03 NOTE — Telephone Encounter (Signed)
i just need to know how many day has pt been taking 300 units qd?

## 2014-08-03 NOTE — Telephone Encounter (Signed)
Please increase it to 130 units qam Please call next week if this does not help. We can still work with this cheaper insulin--we can just increase it.

## 2014-08-03 NOTE — Telephone Encounter (Signed)
Pt advised of note below. Pt states today he has taken 300 units of Novolin because his sugar would not come down below 300. Pt took 100 units at breakfast lunch and around 4pm. While talking with the pt he wanted to know if he could take 20 units of Humalog. Discussed with Vaughan Basta. She instructed the pt to take 10 units of Humalog now and another 10 units in 2 hours if his sugar did not come down. Pt was also advised to check blood sugar every 4 hrs. Pt voiced understanding. Pt states he does not think this insulin is working for him. Please advise, Thanks!

## 2014-08-03 NOTE — Telephone Encounter (Signed)
Pt stated that today (08/03/2014). Pt stated at 4:30 his blood sugar was 330 pt took the 10 units of Humalog at 5:30 his sugar was 309.

## 2014-08-03 NOTE — Telephone Encounter (Signed)
please call patient: How long have you been taking 300 units qd/

## 2014-08-03 NOTE — Telephone Encounter (Signed)
Patient stated that his insulin Humulin N is not working for him, his b/s readings are over 300. Please advise

## 2014-08-04 NOTE — Telephone Encounter (Signed)
Please give the 300 units qam more time--a few more days.  Let us know if this doesn't help

## 2014-08-04 NOTE — Telephone Encounter (Signed)
Pt advised of note below and voiced understanding. Pt stated if his sugar continue to stay high he will contact our office.

## 2014-08-04 NOTE — Telephone Encounter (Signed)
Contacted pt. He stated the only day he had taken 300 units was yesterday (08/03/2014).

## 2014-08-16 ENCOUNTER — Telehealth: Payer: Self-pay | Admitting: Endocrinology

## 2014-08-16 MED ORDER — INSULIN NPH (HUMAN) (ISOPHANE) 100 UNIT/ML ~~LOC~~ SUSP: 300.0000 [IU] | SUBCUTANEOUS | Status: DC

## 2014-08-16 NOTE — Telephone Encounter (Signed)
Sending Novolin NPH refill to pharmacy.

## 2014-08-16 NOTE — Telephone Encounter (Signed)
Sent!

## 2014-08-16 NOTE — Telephone Encounter (Signed)
Pt has run out of insulin after Dr. Cordelia Pen increased his dose 300 u daily please send in new rx with accurate dosage for the novolin N. His blood sugar is well controlled with this increased dose

## 2014-08-24 ENCOUNTER — Other Ambulatory Visit: Payer: Self-pay

## 2014-08-24 ENCOUNTER — Telehealth: Payer: Self-pay | Admitting: Endocrinology

## 2014-08-24 MED ORDER — INSULIN NPH (HUMAN) (ISOPHANE) 100 UNIT/ML ~~LOC~~ SUSP: 300.0000 [IU] | SUBCUTANEOUS | Status: DC

## 2014-08-24 MED ORDER — "INSULIN SYRINGE-NEEDLE U-100 30G X 3/8"" 1 ML MISC": Status: DC

## 2014-08-24 NOTE — Telephone Encounter (Signed)
Pt needs a new rx for novolin N for 3 injections of 100 u daily. Please call into [facility] on [address]

## 2014-08-24 NOTE — Telephone Encounter (Signed)
Rx sent 

## 2014-08-31 ENCOUNTER — Other Ambulatory Visit: Payer: Self-pay | Admitting: Endocrinology

## 2014-09-19 ENCOUNTER — Ambulatory Visit (INDEPENDENT_AMBULATORY_CARE_PROVIDER_SITE_OTHER): Payer: Medicare Other | Admitting: Endocrinology

## 2014-09-19 ENCOUNTER — Encounter: Payer: Self-pay | Admitting: Endocrinology

## 2014-09-19 VITALS — BP 142/62 | HR 76 | Temp 97.9°F | Ht 76.0 in

## 2014-09-19 DIAGNOSIS — E1022 Type 1 diabetes mellitus with diabetic chronic kidney disease: Secondary | ICD-10-CM | POA: Diagnosis not present

## 2014-09-19 DIAGNOSIS — N189 Chronic kidney disease, unspecified: Secondary | ICD-10-CM | POA: Diagnosis not present

## 2014-09-19 DIAGNOSIS — Z23 Encounter for immunization: Secondary | ICD-10-CM

## 2014-09-19 LAB — HEMOGLOBIN A1C: Hgb A1c MFr Bld: 9.5 % — ABNORMAL HIGH (ref 4.6–6.5)

## 2014-09-19 MED ORDER — INSULIN NPH (HUMAN) (ISOPHANE) 100 UNIT/ML ~~LOC~~ SUSP
300.0000 [IU] | SUBCUTANEOUS | Status: DC
Start: 2014-09-19 — End: 2014-10-31

## 2014-09-19 NOTE — Patient Instructions (Addendum)
Please come back for a "medicare wellness" appointment in 3 months. check your blood sugar twice a day.  vary the time of day when you check, between before the 3 meals, and at bedtime.  also check if you have symptoms of your blood sugar being too high or too low.  please keep a record of the readings and bring it to your next appointment here.  You can write it on any piece of paper.  please call us sooner if your blood sugar goes below 70, or if you have a lot of readings over 200.   blood tests are being requested for you today.  We'll let you know about the results. Please take all of the insulin in the morning.  Also, you should take it no matter what your blood sugar is in the morning.

## 2014-09-19 NOTE — Progress Notes (Signed)
Subjective:    Patient ID: Ronald Johnson, male    DOB: 1946/04/30, 69 y.o.   MRN: [id number]  HPI Pt returns for f/u of diabetes mellitus: DM type: Insulin-requiring type 2 Dx'ed: 6144 Complications: polyneuropathy and nephropathy.  Therapy: insulin since 2004 DKA: never Severe hypoglycemia: never Pancreatitis: never. Other: he changed to qd insulin, after he did not achieve good control on multiple daily injections; he cannot afford insulin analogs.  Interval history:  no cbg record, but states cbg's vary from 50-400.  It is in general higher as the day goes on.  pt states he feels well in general.  He spreads the insulin out throughout the day.  He takes less insulin if cbg is 150 or lower in the am.   Past Medical History  Diagnosis Date  . COLONIC POLYPS, ADENOMATOUS 08/01/2008  . DIABETES MELLITUS, TYPE I 03/13/2007  . HYPERCHOLESTEROLEMIA 02/03/2008  . ANEMIA 08/15/2009  . Anxiety state, unspecified 12/15/2008  . DEPRESSION 03/13/2007  . HYPERTENSION 03/13/2007  . PULMONARY NODULE 11/24/2008  . GERD 03/13/2007  . TOBACCO ABUSE 11/24/2008  . EMPHYSEMA 11/08/2008  . Prostatism   . ED (erectile dysfunction)   . [name]'s esophagus   . Hiatal hernia   . Diverticulitis   . Arthritis     Past Surgical History  Procedure Laterality Date  . Egd  11/19/2005  . Foot fracture surgery  1980    right foot w/ pins and screws  . Removal pins and screws foot  1980    right foot  . Upper gastrointestinal endoscopy    . Circumcision      History   Social History  . Marital Status: Married    Spouse Name: N/A    Number of Children: N/A  . Years of Education: N/A   Occupational History  . INSPECTOR     works Youth worker   Social History Main Topics  . Smoking status: Former Smoker -- 1.00 packs/day for 35 years    Types: Cigarettes    Quit date: 10/31/2008  . Smokeless tobacco: Never Used  . Alcohol Use: No     Comment: 2/month  . Drug Use: No  . Sexual Activity:  No   Other Topics Concern  . Not on file   Social History Narrative   Married with children    Current Outpatient Prescriptions on File Prior to Visit  Medication Sig Dispense Refill  . aspirin 81 MG tablet Take 81 mg by mouth daily.      Marland Kitchen atorvastatin (LIPITOR) 10 MG tablet TAKE ONE TABLET BY MOUTH EVERY DAY 180 tablet 1  . CHANTIX 1 MG tablet Take 1 tablet by mouth  twice a day 60 tablet 3  . diazepam (VALIUM) 5 MG tablet Take 1 tablet (5 mg total) by mouth 2 (two) times daily as needed. 180 tablet 1  . glucose blood (PRECISION XTRA TEST STRIPS) test strip Check blood sugar three times a day dx 250.01 270 each 3  . Insulin Syringe-Needle U-100 30G X 3/8" 1 ML MISC Use to inject insulin 3 times per day. 300 each 2  . losartan-hydrochlorothiazide (HYZAAR) 100-12.5 MG per tablet Take 1 tablet by mouth daily. 90 tablet 3  . sildenafil (REVATIO) 20 MG tablet TAKE 1-5 TABLETS BY MOUTH ONCE DAILY AS NEEDED 50 tablet 1  . [DISCONTINUED] metoprolol succinate (TOPROL-XL) 25 MG 24 hr tablet Take 25 mg by mouth daily.      No current facility-administered medications on file prior  to visit.    No Known Allergies  Family History  Problem Relation Age of Onset  . Stroke Mother   . Colon cancer Neg Hx    BP 142/62 mmHg  Pulse 76  Temp(Src) 97.9 F (36.6 C) (Oral)  Ht 6\' 4"  (1.93 m)  SpO2 93%  Review of Systems He denies LOC and weight change.      Objective:   Physical Exam VITAL SIGNS:  See vs page GENERAL: no distress Pulses: dorsalis pedis intact bilat.   MSK: no deformity of the feet CV: no leg edema Skin:  no ulcer on the feet.  normal color and temp on the feet. Neuro: sensation is intact to touch on the feet  Lab Results  Component Value Date   HGBA1C 9.5* 09/19/2014       Assessment & Plan:  DM: severe exacerbation Noncompliance with cbg recording and insulin dosing: I'll work around this as best I can HTN: mild exacerbation.  We'll follow.   Patient is  advised the following: Patient Instructions  Please come back for a "medicare wellness" appointment in 3 months. check your blood sugar twice a day.  vary the time of day when you check, between before the 3 meals, and at bedtime.  also check if you have symptoms of your blood sugar being too high or too low.  please keep a record of the readings and bring it to your next appointment here.  You can write it on any piece of paper.  please call us sooner if your blood sugar goes below 70, or if you have a lot of readings over 200.   blood tests are being requested for you today.  We'll let you know about the results. Please take all of the insulin in the morning.  Also, you should take it no matter what your blood sugar is in the morning.    addendum: please try taking the insulin, 300 units each morning, no matter what your blood sugar is.

## 2014-10-31 ENCOUNTER — Telehealth: Payer: Self-pay | Admitting: Endocrinology

## 2014-10-31 MED ORDER — INSULIN NPH (HUMAN) (ISOPHANE) 100 UNIT/ML ~~LOC~~ SUSP: 300.0000 [IU] | SUBCUTANEOUS | Status: DC

## 2014-10-31 NOTE — Telephone Encounter (Signed)
Rx sent to pharmacy per request.  

## 2014-10-31 NOTE — Telephone Encounter (Signed)
Pt needs refill on novlin- N call into [facility] [phone]

## 2014-10-31 NOTE — Telephone Encounter (Signed)
Questions regarding insulin novolin 300 u morning please call and verify call  [phone] - [facility]

## 2014-10-31 NOTE — Telephone Encounter (Signed)
Called pharmacy and confirmed pt is to take 300 units daily every morning.

## 2014-11-11 ENCOUNTER — Other Ambulatory Visit: Payer: Self-pay | Admitting: Endocrinology

## 2014-11-16 ENCOUNTER — Telehealth: Payer: Self-pay | Admitting: Endocrinology

## 2014-11-16 NOTE — Telephone Encounter (Signed)
Patient stated that he got a call from you, he ask if you would call him back.

## 2014-11-16 NOTE — Telephone Encounter (Signed)
Contacted pt. Pt stated that he was enough refills to last him six months. Pt has a appointment to see Dr. Loanne Drilling on 12/19/2014. Pt is going to request more refills at this appointment because he has enough to last him till his appointment.

## 2014-11-22 ENCOUNTER — Telehealth: Payer: Self-pay

## 2014-11-22 MED ORDER — DIAZEPAM 5 MG PO TABS: 5.0000 mg | ORAL_TABLET | Freq: Two times a day (BID) | ORAL | Status: DC | PRN

## 2014-11-22 NOTE — Telephone Encounter (Signed)
Rx faxed to pharmacy  

## 2014-11-22 NOTE — Telephone Encounter (Signed)
i printed 

## 2014-11-22 NOTE — Telephone Encounter (Signed)
Recived a refill request for Diazepam. Pt was last seen on 09/19/2014 and medication was last refilled on 06/14/2014. Thanks!

## 2014-12-02 ENCOUNTER — Ambulatory Visit (INDEPENDENT_AMBULATORY_CARE_PROVIDER_SITE_OTHER): Payer: Medicare Other | Admitting: Endocrinology

## 2014-12-02 VITALS — BP 126/60 | HR 105 | Temp 97.6°F | Ht 76.0 in | Wt 258.0 lb

## 2014-12-02 DIAGNOSIS — E1122 Type 2 diabetes mellitus with diabetic chronic kidney disease: Secondary | ICD-10-CM

## 2014-12-02 DIAGNOSIS — E1022 Type 1 diabetes mellitus with diabetic chronic kidney disease: Secondary | ICD-10-CM

## 2014-12-02 DIAGNOSIS — I10 Essential (primary) hypertension: Secondary | ICD-10-CM

## 2014-12-02 DIAGNOSIS — Z125 Encounter for screening for malignant neoplasm of prostate: Secondary | ICD-10-CM

## 2014-12-02 DIAGNOSIS — E7800 Pure hypercholesterolemia, unspecified: Secondary | ICD-10-CM

## 2014-12-02 DIAGNOSIS — R079 Chest pain, unspecified: Secondary | ICD-10-CM | POA: Diagnosis not present

## 2014-12-02 DIAGNOSIS — D649 Anemia, unspecified: Secondary | ICD-10-CM

## 2014-12-02 DIAGNOSIS — N189 Chronic kidney disease, unspecified: Secondary | ICD-10-CM

## 2014-12-02 LAB — BASIC METABOLIC PANEL
BUN: 27 mg/dL — ABNORMAL HIGH (ref 6–23)
CALCIUM: 9.6 mg/dL (ref 8.4–10.5)
CO2: 29 meq/L (ref 19–32)
Chloride: 104 mEq/L (ref 96–112)
Creatinine, Ser: 1.24 mg/dL (ref 0.40–1.50)
GFR: 61.88 mL/min (ref >60.00)
Glucose, Bld: 113 mg/dL — ABNORMAL HIGH (ref 70–99)
POTASSIUM: 4.1 meq/L (ref 3.5–5.1)
Sodium: 138 mEq/L (ref 135–145)

## 2014-12-02 LAB — CBC WITH DIFFERENTIAL/PLATELET
BASOS ABS: 0 10*3/uL (ref 0.0–0.1)
Basophils Relative: 0.6 % (ref 0.0–3.0)
Eosinophils Absolute: 0.3 10*3/uL (ref 0.0–0.7)
Eosinophils Relative: 3.5 % (ref 0.0–5.0)
HEMATOCRIT: 36 % — AB (ref 39.0–52.0)
Hemoglobin: 12.5 g/dL — ABNORMAL LOW (ref 13.0–17.0)
LYMPHS ABS: 2.3 10*3/uL (ref 0.7–4.0)
Lymphocytes Relative: 30.6 % (ref 12.0–46.0)
MCHC: 34.8 g/dL (ref 30.0–36.0)
MCV: 90.4 fl (ref 78.0–100.0)
MONO ABS: 0.5 10*3/uL (ref 0.1–1.0)
Monocytes Relative: 6.3 % (ref 3.0–12.0)
Neutro Abs: 4.4 10*3/uL (ref 1.4–7.7)
Neutrophils Relative %: 59 % (ref 43.0–77.0)
Platelets: 194 10*3/uL (ref 150.0–400.0)
RBC: 3.98 Mil/uL — ABNORMAL LOW (ref 4.22–5.81)
RDW: 13.1 % (ref 11.5–15.5)
WBC: 7.4 10*3/uL (ref 4.0–10.5)

## 2014-12-02 LAB — URINALYSIS, ROUTINE W REFLEX MICROSCOPIC
Bilirubin Urine: NEGATIVE
HGB URINE DIPSTICK: NEGATIVE
Ketones, ur: NEGATIVE
Leukocytes, UA: NEGATIVE
Nitrite: NEGATIVE
SPECIFIC GRAVITY, URINE: 1.02 (ref 1.000–1.030)
URINE GLUCOSE: NEGATIVE
UROBILINOGEN UA: 0.2 (ref 0.0–1.0)
WBC UA: NONE SEEN — AB (ref 0–?)
pH: 6.5 (ref 5.0–8.0)

## 2014-12-02 LAB — IBC PANEL
Iron: 82 ug/dL (ref 42–165)
Saturation Ratios: 23.9 % (ref 20.0–50.0)
Transferrin: 245 mg/dL (ref 212.0–360.0)

## 2014-12-02 LAB — LIPID PANEL
CHOL/HDL RATIO: 5
Cholesterol: 170 mg/dL (ref 0–200)
HDL: 32.4 mg/dL — AB (ref >39.00)
Triglycerides: 449 mg/dL — ABNORMAL HIGH (ref 0.0–149.0)

## 2014-12-02 LAB — MICROALBUMIN / CREATININE URINE RATIO
CREATININE, U: 130 mg/dL
MICROALB UR: 20.9 mg/dL — AB (ref 0.0–1.9)
Microalb Creat Ratio: 16.1 mg/g (ref 0.0–30.0)

## 2014-12-02 LAB — PSA, MEDICARE: PSA: 0.85 ng/mL (ref 0.10–4.00)

## 2014-12-02 LAB — TSH: TSH: 2.5 u[IU]/mL (ref 0.35–4.50)

## 2014-12-02 LAB — HEPATIC FUNCTION PANEL
ALBUMIN: 3.9 g/dL (ref 3.5–5.2)
ALK PHOS: 54 U/L (ref 39–117)
ALT: 21 U/L (ref 0–53)
AST: 23 U/L (ref 0–37)
BILIRUBIN DIRECT: 0.1 mg/dL (ref 0.0–0.3)
Total Bilirubin: 0.2 mg/dL (ref 0.2–1.2)
Total Protein: 6.7 g/dL (ref 6.0–8.3)

## 2014-12-02 LAB — HEMOGLOBIN A1C: HEMOGLOBIN A1C: 7.3 % — AB (ref 4.6–6.5)

## 2014-12-02 LAB — LDL CHOLESTEROL, DIRECT: Direct LDL: 93 mg/dL

## 2014-12-02 NOTE — Progress Notes (Signed)
Subjective:    Patient ID: Ronald Johnson, male    DOB: 1946-08-01, 69 y.o.   MRN: [id number]  HPI Pt returns for f/u of diabetes mellitus: DM type: Insulin-requiring type 2 Dx'ed: 6433 Complications: polyneuropathy and nephropathy.  Therapy: insulin since 2004 DKA: never Severe hypoglycemia: never Pancreatitis: never. Other: he changed to qd insulin, after he did not achieve good control on multiple daily injections; he cannot afford insulin analogs.  Interval history:  Pt states few weeks of intermittent mild pain at the upper mid-chest.  It is non-exertional, but is caused by eating.  It lasts 30 sec-2 minutes at a time.  No assoc  no cbg record, but states cbg's are well-controlled Past Medical History  Diagnosis Date  . COLONIC POLYPS, ADENOMATOUS 08/01/2008  . DIABETES MELLITUS, TYPE I 03/13/2007  . HYPERCHOLESTEROLEMIA 02/03/2008  . ANEMIA 08/15/2009  . Anxiety state, unspecified 12/15/2008  . DEPRESSION 03/13/2007  . HYPERTENSION 03/13/2007  . PULMONARY NODULE 11/24/2008  . GERD 03/13/2007  . TOBACCO ABUSE 11/24/2008  . EMPHYSEMA 11/08/2008  . Prostatism   . ED (erectile dysfunction)   . [name]'s esophagus   . Hiatal hernia   . Diverticulitis   . Arthritis     Past Surgical History  Procedure Laterality Date  . Egd  11/19/2005  . Foot fracture surgery  1980    right foot w/ pins and screws  . Removal pins and screws foot  1980    right foot  . Upper gastrointestinal endoscopy    . Circumcision      History   Social History  . Marital Status: Married    Spouse Name: N/A  . Number of Children: N/A  . Years of Education: N/A   Occupational History  . INSPECTOR     works Youth worker   Social History Main Topics  . Smoking status: Former Smoker -- 1.00 packs/day for 35 years    Types: Cigarettes    Quit date: 10/31/2008  . Smokeless tobacco: Never Used  . Alcohol Use: No     Comment: 2/month  . Drug Use: No  . Sexual Activity: No   Other Topics  Concern  . Not on file   Social History Narrative   Married with children    Current Outpatient Prescriptions on File Prior to Visit  Medication Sig Dispense Refill  . aspirin 81 MG tablet Take 81 mg by mouth daily.      Marland Kitchen atorvastatin (LIPITOR) 10 MG tablet TAKE ONE TABLET BY MOUTH EVERY DAY 180 tablet 1  . diazepam (VALIUM) 5 MG tablet Take 1 tablet (5 mg total) by mouth 2 (two) times daily as needed. 180 tablet 1  . glucose blood (PRECISION XTRA TEST STRIPS) test strip Check blood sugar three times a day dx 250.01 270 each 3  . insulin NPH Human (NOVOLIN N RELION) 100 UNIT/ML injection Inject 3 mLs (300 Units total) into the skin every morning. And syringes 1/day 300 mL 1  . Insulin Syringe-Needle U-100 30G X 3/8" 1 ML MISC Use to inject insulin 3 times per day. 300 each 2  . losartan-hydrochlorothiazide (HYZAAR) 100-12.5 MG per tablet Take 1 tablet by mouth  daily 90 tablet 2  . sildenafil (REVATIO) 20 MG tablet TAKE 1-5 TABLETS BY MOUTH ONCE DAILY AS NEEDED 50 tablet 1  . [DISCONTINUED] metoprolol succinate (TOPROL-XL) 25 MG 24 hr tablet Take 25 mg by mouth daily.      No current facility-administered medications on file prior to  visit.    No Known Allergies  Family History  Problem Relation Age of Onset  . Stroke Mother   . Colon cancer Neg Hx     BP 126/60 mmHg  Pulse 105  Temp(Src) 97.6 F (36.4 C) (Oral)  Ht 6\' 4"  (1.93 m)  Wt 258 lb (117.028 kg)  BMI 31.42 kg/m2  SpO2 95%  Review of Systems She has gained weight.  Denies LOC    Objective:   Physical Exam VITAL SIGNS:  See vs page GENERAL: no distress Chest wall: nontender LUNGS:  Clear to auscultation HEART:  Regular rate and rhythm without murmurs noted. Normal S1,S2.   Ext: no edema   i personally reviewed electrocardiogram tracing: lateral t-wave inversions  Lab Results  Component Value Date   HGBA1C 7.3* 12/02/2014       Assessment & Plan:  Chest pain, new, uncertain etiology.  sxs are  atypical for angina, but he has risk factors. DM: this is the best control this pt should aim for, given this regimen, which does match insulin to his changing needs throughout the day.     Patient is advised the following: Patient Instructions  Please come back for a "medicare wellness" appointment in 3 months. check your blood sugar twice a day.  vary the time of day when you check, between before the 3 meals, and at bedtime.  also check if you have symptoms of your blood sugar being too high or too low.  please keep a record of the readings and bring it to your next appointment here.  You can write it on any piece of paper.  please call us sooner if your blood sugar goes below 70, or if you have a lot of readings over 200.   blood tests are being requested for you today.  We'll let you know about the results. Let's check a treadmill test.

## 2014-12-02 NOTE — Progress Notes (Signed)
Pre visit review using our clinic review tool, if applicable. No additional management support is needed unless otherwise documented below in the visit note. 

## 2014-12-02 NOTE — Patient Instructions (Addendum)
Please come back for a "medicare wellness" appointment in 3 months. check your blood sugar twice a day.  vary the time of day when you check, between before the 3 meals, and at bedtime.  also check if you have symptoms of your blood sugar being too high or too low.  please keep a record of the readings and bring it to your next appointment here.  You can write it on any piece of paper.  please call us sooner if your blood sugar goes below 70, or if you have a lot of readings over 200.   blood tests are being requested for you today.  We'll let you know about the results. Let's check a treadmill test.

## 2014-12-19 ENCOUNTER — Ambulatory Visit: Payer: Medicare Other | Admitting: Endocrinology

## 2014-12-27 ENCOUNTER — Encounter: Payer: Self-pay | Admitting: Adult Health

## 2014-12-27 ENCOUNTER — Ambulatory Visit (INDEPENDENT_AMBULATORY_CARE_PROVIDER_SITE_OTHER): Payer: Medicare Other | Admitting: Adult Health

## 2014-12-27 ENCOUNTER — Ambulatory Visit (INDEPENDENT_AMBULATORY_CARE_PROVIDER_SITE_OTHER)
Admission: RE | Admit: 2014-12-27 | Discharge: 2014-12-27 | Disposition: A | Discharge status: Home / Self Care | Payer: Medicare Other | Source: Ambulatory Visit | Attending: Adult Health | Admitting: Adult Health

## 2014-12-27 ENCOUNTER — Ambulatory Visit: Payer: PRIVATE HEALTH INSURANCE | Admitting: Adult Health

## 2014-12-27 VITALS — BP 136/90 | HR 99 | Temp 97.7°F | Ht 76.0 in | Wt 260.8 lb

## 2014-12-27 DIAGNOSIS — J449 Chronic obstructive pulmonary disease, unspecified: Secondary | ICD-10-CM | POA: Diagnosis not present

## 2014-12-27 DIAGNOSIS — J441 Chronic obstructive pulmonary disease with (acute) exacerbation: Secondary | ICD-10-CM | POA: Diagnosis not present

## 2014-12-27 DIAGNOSIS — J438 Other emphysema: Secondary | ICD-10-CM | POA: Diagnosis not present

## 2014-12-27 MED ORDER — FLUTICASONE FUROATE-VILANTEROL 100-25 MCG/INH IN AEPB: 1.0000 | INHALATION_SPRAY | Freq: Every day | RESPIRATORY_TRACT | Status: DC

## 2014-12-27 NOTE — Patient Instructions (Addendum)
Begin BREO 1 puff daily   Follow with Dr. Elsworth Soho in 6 weeks  And as needed  Chest x-ray today Please contact office for sooner follow up if symptoms do not improve or worsen or seek emergency care

## 2014-12-27 NOTE — Progress Notes (Signed)
   Subjective:    Patient ID: Ronald Johnson, male    DOB: Jan 15, 1946, 69 y.o.   MRN: [id number]  HPI 67/M, ex -smoker for FU of COPD & pulm nodules, stable since 2008.  Adm for LLL pneumococcal pneumonia 3/12010  03/2009 Spirometry >>mild airway obstruction, paradoxical decrease in lung function with albuterol, 'passed out' during maneuver.  CT chest 10/12 >> stable nodules    10/2012 - RLL nodule from 04/2012 -resolved Multiple small nodules - stable since 2008 (first CT) Lost 70 lbs! Insulin requirement has decreased  Completed pulm rehab  quit smoking since 10/12 , Back on chantix for cravings  Has stopped symbicort & qvar  Concerned about lung nodules CT 10/2012 -RLL GGO noted in 04/2012 -resolved, other nodules stable Requests valium Rx   12/27/2014 Acute OV  Pt returns for episode of dyspnea that occurred 4 weeks ago, last about 1-2 weeks with DOE . Dyspnea resolved and he is back to baseline.  No chest pain, cough or wheezing .  He has known COPD/Emphysema , previous heavy smoker.  Was on inhalers years ago , but stopped -did not feel they helped Started exercising , lost 60lb and stopped smoking few years ago  Has felt good, exercises everyday. Walks 2+ miles each day  Works out at gym and does yard work without Dyspnea.    PFT  2012 showed an  Post bronchodilator FEV1 of 43%, ratio 55 positive bronchodilator response , FVC at 54%, diffusing capacity at 67%   simple spirometry today shows an FEV1 at 35%  , ratio 58 FVC 46%.  Patient denies any hemoptysis, orthopnea, PND or leg swelling.  Walk test with no desats.  Weight has increaed 30lbs over last year.   Review of Systems neg for any significant sore throat, dysphagia, itching, sneezing, nasal congestion or excess/ purulent secretions, fever, chills, sweats, unintended wt loss, pleuritic or exertional cp, hempoptysis, orthopnea pnd or change in chronic leg swelling. Also denies presyncope, palpitations, heartburn,  abdominal pain, nausea, vomiting, diarrhea or change in bowel or urinary habits, dysuria,hematuria, rash, arthralgias, visual complaints, headache, numbness weakness or ataxia.     Objective:   Physical Exam  Gen. Pleasant, well-nourished, in no distress ENT - no lesions, no post nasal drip Neck: No JVD, no thyromegaly, no carotid bruits Lungs: no use of accessory muscles, no dullness to percussion, clear without rales or rhonchi  Cardiovascular: Rhythm regular, heart sounds  normal, no murmurs or gallops, no peripheral edema Musculoskeletal: No deformities, no cyanosis or clubbing        Assessment & Plan:

## 2014-12-27 NOTE — Assessment & Plan Note (Signed)
Severe COPD with intermittent symptoms, w/ some reversibility .  Believe he would benefit from Brilliant 1 puff daily   Follow with Dr. Elsworth Soho in 6 weeks  And as needed  Chest x-ray today Please contact office for sooner follow up if symptoms do not improve or worsen or seek emergency care

## 2014-12-28 NOTE — Progress Notes (Signed)
Reviewed & agree with plan  

## 2015-01-05 ENCOUNTER — Other Ambulatory Visit: Payer: Self-pay | Admitting: *Deleted

## 2015-01-05 MED ORDER — FLUTICASONE FUROATE-VILANTEROL 100-25 MCG/INH IN AEPB: 1.0000 | INHALATION_SPRAY | Freq: Every day | RESPIRATORY_TRACT | Status: DC

## 2015-01-06 ENCOUNTER — Other Ambulatory Visit: Payer: Self-pay | Admitting: Nurse Practitioner

## 2015-01-06 ENCOUNTER — Encounter: Payer: PRIVATE HEALTH INSURANCE | Admitting: Nurse Practitioner

## 2015-01-06 ENCOUNTER — Ambulatory Visit: Payer: Medicare Other | Admitting: Nurse Practitioner

## 2015-01-06 DIAGNOSIS — R079 Chest pain, unspecified: Secondary | ICD-10-CM

## 2015-01-06 NOTE — Progress Notes (Signed)
Patient presented today for a GXT. He has resting abnormal EKG with inferolateral ST/T wave changes.   Has multiple CV risk factors. Recent COPD exacerbation  Will order Lexiscan Myoview.   Further disposition to follow.  Patient is agreeable to this plan and will call if any problems develop in the interim.   Burtis Junes, RN, [facility] [address] [place],   [phone]

## 2015-01-17 ENCOUNTER — Telehealth (HOSPITAL_COMMUNITY): Payer: Self-pay

## 2015-01-17 NOTE — Telephone Encounter (Signed)
Encounter complete. 

## 2015-01-18 ENCOUNTER — Telehealth (HOSPITAL_COMMUNITY): Payer: Self-pay

## 2015-01-18 NOTE — Telephone Encounter (Signed)
Encounter complete. 

## 2015-01-19 ENCOUNTER — Ambulatory Visit (HOSPITAL_COMMUNITY)
Admission: RE | Admit: 2015-01-19 | Discharge: 2015-01-19 | Disposition: A | Discharge status: Home / Self Care | Payer: Medicare Other | Source: Ambulatory Visit | Attending: Cardiovascular Disease | Admitting: Cardiovascular Disease

## 2015-01-19 DIAGNOSIS — R079 Chest pain, unspecified: Secondary | ICD-10-CM | POA: Diagnosis not present

## 2015-01-19 LAB — MYOCARDIAL PERFUSION IMAGING
Estimated workload: 1 METS
LV dias vol: 170 mL
LV sys vol: 83 mL
Nuc Stress EF: 51 %
Peak HR: 78 {beats}/min
Percent of predicted max HR: 50 %
Rest HR: 66 {beats}/min
SRS: 1
SSS: 2
Stage 1 DBP: 58 mmHg
Stage 1 Grade: 0 %
Stage 1 HR: 77 {beats}/min
Stage 1 SBP: 124 mmHg
Stage 1 Speed: 0 mph
Stage 2 Grade: 0 %
Stage 2 HR: 77 {beats}/min
Stage 2 Speed: 0 mph
Stage 3 Grade: 0 %
Stage 3 HR: 78 {beats}/min
Stage 3 Speed: 0 mph
Stage 4 DBP: 68 mmHg
Stage 4 Grade: 0 %
Stage 4 HR: 78 {beats}/min
Stage 4 SBP: 138 mmHg
Stage 4 Speed: 0 mph
TID: 1.3

## 2015-01-19 MED ORDER — TECHNETIUM TC 99M SESTAMIBI GENERIC - CARDIOLITE
31.6000 | Freq: Once | INTRAVENOUS | Status: AC | PRN
  Administered 2015-01-19: 32 via INTRAVENOUS

## 2015-01-19 MED ORDER — REGADENOSON 0.4 MG/5ML IV SOLN
0.4000 mg | Freq: Once | INTRAVENOUS | Status: AC
  Administered 2015-01-19: 0.4 mg via INTRAVENOUS

## 2015-01-19 MED ORDER — AMINOPHYLLINE 25 MG/ML IV SOLN
75.0000 mg | Freq: Once | INTRAVENOUS | Status: AC
  Administered 2015-01-19: 75 mg via INTRAVENOUS

## 2015-01-19 MED ORDER — TECHNETIUM TC 99M SESTAMIBI GENERIC - CARDIOLITE
11.0000 | Freq: Once | INTRAVENOUS | Status: AC | PRN
  Administered 2015-01-19: 11 via INTRAVENOUS

## 2015-01-23 ENCOUNTER — Telehealth: Payer: Self-pay | Admitting: Internal Medicine

## 2015-01-23 NOTE — Telephone Encounter (Signed)
Pt states that every time he eats it feels like there is a knife in his stomach. Pt states he has seen his PCP and pulmonary doc and was told to followup with GI. Pt scheduled to see Amy Esterwood PA 01/31/15@3pm . Pt aware of appt.

## 2015-01-30 ENCOUNTER — Encounter: Payer: Self-pay | Admitting: *Deleted

## 2015-01-31 ENCOUNTER — Ambulatory Visit (INDEPENDENT_AMBULATORY_CARE_PROVIDER_SITE_OTHER): Payer: Medicare Other | Admitting: Physician Assistant

## 2015-01-31 ENCOUNTER — Other Ambulatory Visit (INDEPENDENT_AMBULATORY_CARE_PROVIDER_SITE_OTHER): Payer: Medicare Other

## 2015-01-31 ENCOUNTER — Encounter: Payer: Self-pay | Admitting: Physician Assistant

## 2015-01-31 VITALS — BP 140/70 | HR 88 | Ht 74.75 in | Wt 265.1 lb

## 2015-01-31 DIAGNOSIS — R1011 Right upper quadrant pain: Secondary | ICD-10-CM

## 2015-01-31 DIAGNOSIS — R1013 Epigastric pain: Secondary | ICD-10-CM

## 2015-01-31 DIAGNOSIS — R1010 Upper abdominal pain, unspecified: Secondary | ICD-10-CM

## 2015-01-31 LAB — CBC WITH DIFFERENTIAL/PLATELET
Basophils Absolute: 0 10*3/uL (ref 0.0–0.1)
Basophils Relative: 0.5 % (ref 0.0–3.0)
EOS ABS: 0.5 10*3/uL (ref 0.0–0.7)
Eosinophils Relative: 6.5 % — ABNORMAL HIGH (ref 0.0–5.0)
HEMATOCRIT: 36.4 % — AB (ref 39.0–52.0)
HEMOGLOBIN: 12.4 g/dL — AB (ref 13.0–17.0)
LYMPHS ABS: 1.8 10*3/uL (ref 0.7–4.0)
LYMPHS PCT: 24.3 % (ref 12.0–46.0)
MCHC: 34 g/dL (ref 30.0–36.0)
MCV: 91.9 fl (ref 78.0–100.0)
MONOS PCT: 5.8 % (ref 3.0–12.0)
Monocytes Absolute: 0.4 10*3/uL (ref 0.1–1.0)
NEUTROS ABS: 4.6 10*3/uL (ref 1.4–7.7)
Neutrophils Relative %: 62.9 % (ref 43.0–77.0)
Platelets: 198 10*3/uL (ref 150.0–400.0)
RBC: 3.96 Mil/uL — AB (ref 4.22–5.81)
RDW: 12.9 % (ref 11.5–15.5)
WBC: 7.4 10*3/uL (ref 4.0–10.5)

## 2015-01-31 LAB — COMPREHENSIVE METABOLIC PANEL
ALK PHOS: 63 U/L (ref 39–117)
ALT: 18 U/L (ref 0–53)
AST: 18 U/L (ref 0–37)
Albumin: 4.3 g/dL (ref 3.5–5.2)
BILIRUBIN TOTAL: 0.3 mg/dL (ref 0.2–1.2)
BUN: 29 mg/dL — ABNORMAL HIGH (ref 6–23)
CO2: 24 mEq/L (ref 19–32)
Calcium: 9.4 mg/dL (ref 8.4–10.5)
Chloride: 103 mEq/L (ref 96–112)
Creatinine, Ser: 1.21 mg/dL (ref 0.40–1.50)
GFR: 63.62 mL/min (ref >60.00)
GLUCOSE: 239 mg/dL — AB (ref 70–99)
Potassium: 4.4 mEq/L (ref 3.5–5.1)
SODIUM: 134 meq/L — AB (ref 135–145)
TOTAL PROTEIN: 6.9 g/dL (ref 6.0–8.3)

## 2015-01-31 LAB — LIPASE: LIPASE: 72 U/L — AB (ref 11.0–59.0)

## 2015-01-31 NOTE — Progress Notes (Signed)
Patient ID: Ronald Johnson, male   DOB: [birth year], 69 y.o.   MRN: [id number]   Subjective:    Patient ID: Ronald Johnson, male    DOB: 06/04/1946, 69 y.o.   MRN: [id number]  HPI Ronald Johnson is a pleasant 69 year old white male known to Dr. Henrene Johnson. He has history of insulin-dependent diabetes mellitus, hypertension, COPD, anxiety/depression. He had undergone colonoscopy in January 2014 4 history of colon polyps and had 3 polyps removed ranging in size from 3-7 mm is noted to have mild sigmoid diverticulosis. Path on the polyps consistent with tubular adenoma and hyperplastci polyps,he was recommended for 3 year interval follow-up. He had undergone an EGD in 2013 because of history of [name]'s esophagus noted noted to have a small hiatal hernia however biopsy showed no evidence of [name]'s. Patient comes in today with complaints of six-week history of epigastric pain which he describes as sharp and stabbing and exacerbated by by mouth intake. He says that every time he eats his symptoms food hits his stomach he develops pain which she describes as a raw irritated type of feeling. It lasts for 10-15 minutes and then gradually subsides. He doesn't have any symptoms with liquids. He has had some vague nausea but no vomiting appetite has been okay weight has been stable. No fever or chills. He has also developed constipation which is new for him over the past couple of months. He says he is having a bowel movement every day but is not passing a significant amount of stool. He is using over-the-counter laxatives as needed. He has not noted any melena or hematochezia. He has no complaints of dysphagia or odynophagia. On any PPI. He generally takes an Aleve PM at least a couple of times per week no other regular NSAIDs. Ronald Johnson Patient says when he initially developed his symptoms he had woken up early 1 morning with symptoms and also felt short of breath and then felt fatigued after that for a couple of weeks. He has  undergone cardiac workup with Myoview which was low risk. He had labs done in April 2016 for the same complaints with a normal CBC, and hepatic panel.  Review of Systems Pertinent positive and negative review of systems were noted in the above HPI section.  All other review of systems was otherwise negative.  Outpatient Encounter Prescriptions as of 01/31/2015  Medication Sig  . aspirin 81 MG tablet Take 81 mg by mouth daily.    Marland Kitchen atorvastatin (LIPITOR) 10 MG tablet TAKE ONE TABLET BY MOUTH EVERY DAY  . diazepam (VALIUM) 5 MG tablet Take 1 tablet (5 mg total) by mouth 2 (two) times daily as needed.  . Fluticasone Furoate-Vilanterol 100-25 MCG/INH AEPB Inhale 1 puff into the lungs daily.  . Fluticasone Furoate-Vilanterol 100-25 MCG/INH AEPB Inhale 1 puff into the lungs daily.  Marland Kitchen glucose blood (PRECISION XTRA TEST STRIPS) test strip Check blood sugar three times a day dx 250.01  . insulin NPH Human (NOVOLIN N RELION) 100 UNIT/ML injection Inject 3 mLs (300 Units total) into the skin every morning. And syringes 1/day  . Insulin Syringe-Needle U-100 30G X 3/8" 1 ML MISC Use to inject insulin 3 times per day.  . losartan-hydrochlorothiazide (HYZAAR) 100-12.5 MG per tablet Take 1 tablet by mouth  daily  . sildenafil (REVATIO) 20 MG tablet TAKE 1-5 TABLETS BY MOUTH ONCE DAILY AS NEEDED   No facility-administered encounter medications on file as of 01/31/2015.   No Known Allergies Patient Active Problem List  Diagnosis Date Noted  . Chest pain 12/02/2014  . Quit smoking within past year 11/04/2013  . Screening for prostate cancer 11/04/2013  . Encounter for long-term (current) use of other medications 11/04/2013  . Diabetes 04/29/2013  . Routine general medical examination at a health care facility 11/08/2012  . Type 1 diabetes mellitus with renal manifestations 11/03/2011  . Gastroenteritis 09/10/2011  . Anemia 08/15/2009  . PROTEINURIA, MILD 08/15/2009  . ANXIETY STATE, UNSPECIFIED 12/15/2008   . PULMONARY NODULE 11/24/2008  . EMPHYSEMA 11/08/2008  . COLONIC POLYPS, ADENOMATOUS 08/01/2008  . UNSPECIFIED INFLAMMATORY AND TOXIC NEUROPATHY 05/19/2008  . CHEST PAIN, PLEURITIC 05/19/2008  . HYPERCHOLESTEROLEMIA 02/03/2008  . DEPRESSION 03/13/2007  . Essential hypertension 03/13/2007  . GERD 03/13/2007   History   Social History  . Marital Status: Married    Spouse Name: N/A  . Number of Children: 2  . Years of Education: N/A   Occupational History  . INSPECTOR     works Youth worker   Social History Main Topics  . Smoking status: Former Smoker -- 1.00 packs/day for 35 years    Types: Cigarettes    Quit date: 10/31/2008  . Smokeless tobacco: Never Used  . Alcohol Use: 0.0 oz/week    0 Standard drinks or equivalent per week     Comment: 2/month only on vacation  . Drug Use: No  . Sexual Activity: No   Other Topics Concern  . Not on file   Social History Narrative   Married with children    [name]'s family history includes Diabetes in his maternal grandmother and mother; Stroke in his mother. There is no history of Colon cancer.      Objective:    Filed Vitals:   01/31/15 1505  BP: 140/70  Pulse: 88    Physical Exam  well-developed older white male in no acute distress, pleasant blood pressure 140/70 pulse 88 height 6 foot 2 weight 265. HEENT; nontraumatic normocephalic EOMI PERRLA sclera anicteric Neck;supple no JVD, Cardiovascular ;regular rate and rhythm with S1-S2 no murmur rub or gallop, Pulmonary; clear bilaterally, Abdomen ;soft, basically nontender there is no palpable mass or hepatosplenomegaly bowel sounds are present, Rectal ;exam not done, Extremities ;no clubbing cyanosis or edema skin warm and dry, Psych ;mood and affect normal and appropriate       Assessment & Plan:   #1 69 yo WM with 6 week hx of post prandial epigastric pain- R/O PUD, gastritis, Gb disease #2 Hx of adenomatous polyps- due for follow up 3 years  #3 IDDM #4  HTN  #5 COPD  #6 Anx/depression  Plan; CBC, CMET,lipase  Schedule for upper abdominal US  Schedule for EGD with Dr. Atilano Johnson discussed in detail with pt and he is agreeable to proceed.  Pt reluctant to start a PPI because had a difficult time with rebound sxs when weaned himself off in the past ,so will try Pepcid 20 mg po BID.   Amy Genia Harold PA-C 01/31/2015   Cc: [name], MD

## 2015-01-31 NOTE — Patient Instructions (Addendum)
Please go to the basement level to have your labs drawn.   You have been scheduled for an abdominal ultrasound at [facility] (1st floor of hospital) on Wed 02-08-2015 at 9:00 am . Please arrive at 8:45 am  prior to your appointment for registration. Make certain not to have anything to eat or drink 6 hours prior to your appointment. Should you need to reschedule your appointment, please contact radiology at [phone]. This test typically takes about 30 minutes to perform.   You have been scheduled for an endoscopy. Please follow written instructions given to you at your visit today. If you use inhalers (even only as needed), please bring them with you on the day of your procedure. Your physician has requested that you go to www.startemmi.com and enter the access code given to you at your visit today. This web site gives a general overview about your procedure. However, you should still follow specific instructions given to you by our office regarding your preparation for the procedure. Take Pepcid 20 mg, take 1 tablet twice daily.

## 2015-02-07 ENCOUNTER — Other Ambulatory Visit: Payer: Self-pay | Admitting: Endocrinology

## 2015-02-08 ENCOUNTER — Ambulatory Visit (HOSPITAL_COMMUNITY)
Admission: RE | Admit: 2015-02-08 | Discharge: 2015-02-08 | Disposition: A | Discharge status: Home / Self Care | Payer: Medicare Other | Source: Ambulatory Visit | Attending: Physician Assistant | Admitting: Physician Assistant

## 2015-02-08 DIAGNOSIS — N281 Cyst of kidney, acquired: Secondary | ICD-10-CM | POA: Insufficient documentation

## 2015-02-08 DIAGNOSIS — R1013 Epigastric pain: Secondary | ICD-10-CM

## 2015-02-08 DIAGNOSIS — R748 Abnormal levels of other serum enzymes: Secondary | ICD-10-CM | POA: Insufficient documentation

## 2015-02-08 DIAGNOSIS — R1011 Right upper quadrant pain: Secondary | ICD-10-CM

## 2015-02-10 ENCOUNTER — Other Ambulatory Visit: Payer: Self-pay

## 2015-02-10 ENCOUNTER — Telehealth: Payer: Self-pay

## 2015-02-10 DIAGNOSIS — R1032 Left lower quadrant pain: Secondary | ICD-10-CM

## 2015-02-10 NOTE — Telephone Encounter (Signed)
-----   Message from Alfredia Ferguson, [place] sent at 02/09/2015 11:49 AM EDT ----- Please let pt know the [place] looks ok, mild prominence of pancreatic duct so I think he should have a CT abdomen -pancreatic protocol , to r/o chronic pancreatitis or pancreatic neoplasm-please schedule

## 2015-02-10 NOTE — Telephone Encounter (Signed)
Patient is advised . Agrees to the CT of the abdomen. He wants something to help with the abdominal pain. When he eats he gets a sharp stabbing pain that he says makes him yell out in pain. Pepcid has not helped. He is scheduled for the CT of the Abdomen to evaluate the pancreas.

## 2015-02-13 ENCOUNTER — Other Ambulatory Visit: Payer: Self-pay

## 2015-02-13 MED ORDER — TRAMADOL HCL 50 MG PO TABS: 50.0000 mg | ORAL_TABLET | Freq: Four times a day (QID) | ORAL | Status: DC | PRN

## 2015-02-13 NOTE — Telephone Encounter (Signed)
He did not want to start on a PPI- would ask again if wants to try protonix 40 mg po qam, can give him rx for Ultram 50 mg q 6 hours prn pain # 30/0 refills if he wants pain med

## 2015-02-13 NOTE — Telephone Encounter (Signed)
Patient again declines to start a PPI. He is concerned about getting "stuck on them". Agrees to try the Tramadol. Rx called to the [facility] at [facility].

## 2015-02-17 ENCOUNTER — Ambulatory Visit (INDEPENDENT_AMBULATORY_CARE_PROVIDER_SITE_OTHER)
Admission: RE | Admit: 2015-02-17 | Discharge: 2015-02-17 | Disposition: A | Discharge status: Home / Self Care | Payer: Medicare Other | Source: Ambulatory Visit | Attending: Physician Assistant | Admitting: Physician Assistant

## 2015-02-17 DIAGNOSIS — R1032 Left lower quadrant pain: Secondary | ICD-10-CM

## 2015-02-17 DIAGNOSIS — N281 Cyst of kidney, acquired: Secondary | ICD-10-CM | POA: Diagnosis not present

## 2015-02-17 MED ORDER — IOHEXOL 300 MG/ML  SOLN
100.0000 mL | Freq: Once | INTRAMUSCULAR | Status: AC | PRN
  Administered 2015-02-17: 100 mL via INTRAVENOUS

## 2015-02-21 ENCOUNTER — Ambulatory Visit: Payer: PRIVATE HEALTH INSURANCE | Admitting: Pulmonary Disease

## 2015-03-03 ENCOUNTER — Encounter: Payer: Self-pay | Admitting: Endocrinology

## 2015-03-03 ENCOUNTER — Ambulatory Visit (INDEPENDENT_AMBULATORY_CARE_PROVIDER_SITE_OTHER): Payer: Medicare Other | Admitting: Endocrinology

## 2015-03-03 VITALS — BP 132/72 | HR 76 | Temp 97.6°F | Ht 74.75 in | Wt 267.0 lb

## 2015-03-03 DIAGNOSIS — N189 Chronic kidney disease, unspecified: Secondary | ICD-10-CM | POA: Diagnosis not present

## 2015-03-03 DIAGNOSIS — E1022 Type 1 diabetes mellitus with diabetic chronic kidney disease: Secondary | ICD-10-CM | POA: Diagnosis not present

## 2015-03-03 DIAGNOSIS — Z0000 Encounter for general adult medical examination without abnormal findings: Secondary | ICD-10-CM

## 2015-03-03 LAB — POCT GLYCOSYLATED HEMOGLOBIN (HGB A1C): Hemoglobin A1C: 8

## 2015-03-03 MED ORDER — VENLAFAXINE HCL ER 75 MG PO CP24: 75.0000 mg | ORAL_CAPSULE | Freq: Every day | ORAL | Status: DC

## 2015-03-03 MED ORDER — INSULIN NPH (HUMAN) (ISOPHANE) 100 UNIT/ML ~~LOC~~ SUSP: 330.0000 [IU] | SUBCUTANEOUS | Status: DC

## 2015-03-03 NOTE — Progress Notes (Signed)
we discussed code status.  pt requests full code, but would not want to be started or maintained on artificial life-support measures if there was not a reasonable chance of recovery 

## 2015-03-03 NOTE — Patient Instructions (Addendum)
Please increase the insulin to 330 units each morning.  If you are going to be active, take just 200 units that morning.   check your blood sugar twice a day.  vary the time of day when you check, between before the 3 meals, and at bedtime.  also check if you have symptoms of your blood sugar being too high or too low.  please keep a record of the readings and bring it to your next appointment here.  You can write it on any piece of paper.  please call us sooner if your blood sugar goes below 70, or if you have a lot of readings over 200.   i have sent a prescription to your pharmacy, for effexor.  This is probably not enough, so please call us in 2 weeks, to tell us how you feel.   You should minimize tramadol while taking this.   good diet and exercise significantly improve the control of your diabetes.  please let me know if you wish to be referred to a dietician.  high blood sugar is very risky to your health.  you should see an eye doctor and dentist every year.  It is very important to get all recommended vaccinations.  please consider these measures for your health:  minimize alcohol.  do not use tobacco products.  have a colonoscopy at least every 10 years from age 80.  keep firearms safely stored.  always use seat belts.  have working smoke alarms in your home.  see an eye doctor and dentist regularly.  never drive under the influence of alcohol or drugs (including prescription drugs).  those with fair skin should take precautions against the sun. it is critically important to prevent falling down (keep floor areas well-lit, dry, and free of loose objects.  If you have a cane, walker, or wheelchair, you should use it, even for short trips around the house.  Also, try not to rush) Please come back for a follow-up appointment in 3 months.

## 2015-03-03 NOTE — Progress Notes (Signed)
Subjective:    Patient ID: Ronald Johnson, male    DOB: 11/03/1945, 69 y.o.   MRN: [id number]  HPI Pt returns for f/u of diabetes mellitus: DM type: Insulin-requiring type 2.  Dx'ed: 6629 Complications: polyneuropathy, PAD, and nephropathy.  Therapy: insulin since 2004.  DKA: never.  Severe hypoglycemia: never. Pancreatitis: never. Other: he changed to qd insulin, after he did not achieve good control on multiple daily injections; he cannot afford insulin analogs.  Interval history:  no cbg record, but states cbg's vary from 120-300.  There is no trend throughout the day.  He seldom has hypoglycemia, and these episodes are mild.  This happens with exertion.   Past Medical History  Diagnosis Date  . COLONIC POLYPS, ADENOMATOUS 08/01/2008    09/01/2012 also  . DIABETES MELLITUS, TYPE I 03/13/2007  . HYPERCHOLESTEROLEMIA 02/03/2008  . ANEMIA 08/15/2009  . Anxiety state, unspecified 12/15/2008  . DEPRESSION 03/13/2007  . HYPERTENSION 03/13/2007  . PULMONARY NODULE 11/24/2008  . GERD 03/13/2007  . TOBACCO ABUSE 11/24/2008  . EMPHYSEMA 11/08/2008  . Prostatism   . ED (erectile dysfunction)   . [name]'s esophagus   . Hiatal hernia   . Diverticulitis   . Arthritis   . COPD (chronic obstructive pulmonary disease)     Past Surgical History  Procedure Laterality Date  . Egd  11/19/2005  . Foot fracture surgery Right 1980    right foot w/ pins and screws  . Removal pins and screws foot  1980    right foot  . Upper gastrointestinal endoscopy    . Circumcision      History   Social History  . Marital Status: Married    Spouse Name: N/A  . Number of Children: 2  . Years of Education: N/A   Occupational History  . INSPECTOR     works Youth worker   Social History Main Topics  . Smoking status: Former Smoker -- 1.00 packs/day for 35 years    Types: Cigarettes    Quit date: 10/31/2008  . Smokeless tobacco: Never Used  . Alcohol Use: 0.0 oz/week    0 Standard drinks or  equivalent per week     Comment: 2/month only on vacation  . Drug Use: No  . Sexual Activity: No   Other Topics Concern  . Not on file   Social History Narrative   Married with children    Current Outpatient Prescriptions on File Prior to Visit  Medication Sig Dispense Refill  . aspirin 81 MG tablet Take 81 mg by mouth daily.      Marland Kitchen atorvastatin (LIPITOR) 10 MG tablet TAKE ONE TABLET BY MOUTH EVERY DAY 180 tablet 1  . diazepam (VALIUM) 5 MG tablet Take 1 tablet (5 mg total) by mouth 2 (two) times daily as needed. 180 tablet 1  . Fluticasone Furoate-Vilanterol 100-25 MCG/INH AEPB Inhale 1 puff into the lungs daily. 1 each 0  . glucose blood (PRECISION XTRA TEST STRIPS) test strip Check blood sugar three times a day dx 250.01 270 each 3  . Insulin Syringe-Needle U-100 30G X 3/8" 1 ML MISC Use to inject insulin 3 times per day. 300 each 2  . losartan-hydrochlorothiazide (HYZAAR) 100-12.5 MG per tablet Take 1 tablet by mouth  daily 90 tablet 2  . sildenafil (REVATIO) 20 MG tablet TAKE 1-5 TABLETS BY MOUTH ONCE DAILY AS NEEDED 50 tablet 1  . traMADol (ULTRAM) 50 MG tablet Take 1 tablet (50 mg total) by mouth every 6 (six)  hours as needed. 30 tablet 0  . [DISCONTINUED] metoprolol succinate (TOPROL-XL) 25 MG 24 hr tablet Take 25 mg by mouth daily.      No current facility-administered medications on file prior to visit.    No Known Allergies  Family History  Problem Relation Age of Onset  . Stroke Mother   . Colon cancer Neg Hx   . Diabetes Mother   . Diabetes Maternal Grandmother     mother side of the family aunts, MGF    BP 132/72 mmHg  Pulse 76  Temp(Src) 97.6 F (36.4 C) (Oral)  Ht 6' 2.75" (1.899 m)  Wt 267 lb (121.11 kg)  BMI 33.58 kg/m2  SpO2 93%   Review of Systems Chronic sxs of epigastric pain and depression both persist.  Cymbalta did not help.  He does not recall what happened with effexor.       Objective:   Physical Exam VITAL SIGNS:  See vs  page GENERAL: no distress Pulses: dorsalis pedis intact bilat.   MSK: no deformity of the feet CV: no leg edema Skin:  no ulcer on the feet.  normal color and temp on the feet. Neuro: sensation is intact to touch on the feet.   A1c=8.0%    Assessment & Plan:  DM: he needs increased rx.  Please increase the insulin to 330 units each morning.  If you are going to be active, take just 200 units that morning.  Depression: persistent.  i have sent a prescription to your pharmacy, for effexor.  This is probably not enough, so please call us in 2 weeks, to tell us how you feel.   Chronic pain syndrome, persistent.  Due to an interaction, you should minimize tramadol while taking this.    Subjective:   Patient here for Medicare annual wellness visit and management of other chronic and acute problems.     Risk factors: advanced age    34 of Physicians Providing Medical Care to Patient:  See "snapshot"   Activities of Daily Living: In your present state of health, do you have any difficulty performing the following activities?:  Preparing food and eating?: No  Bathing yourself: No  Getting dressed: No  Using the toilet:No  Moving around from place to place: No  In the past year have you fallen or had a near fall?:No    Home Safety: Has smoke detector and wears seat belts. Firearms are safely stored. No excess sun exposure.    Diet and Exercise  Current exercise habits: pt says good.   Dietary issues discussed: pt says his diet is not healthy.    Depression Screen  Q1: Over the past two weeks, have you felt down, depressed or hopeless? yes  Q2: Over the past two weeks, have you felt little interest or pleasure in doing things? yes  The following portions of the patient's history were reviewed and updated as appropriate: allergies, current medications, past family history, past medical history, past social history, past surgical history and problem list.   Review of Systems   Denies hearing loss, and visual loss.  Objective:   Vision:  Sees opthalmologist Hearing: grossly normal Body mass index:  See vs page Msk: pt easily and quickly performs "get-up-and-go" from a sitting position Cognitive Impairment Assessment: cognition, memory and judgment appear normal.  remembers 3/3 at 5 minutes.  excellent recall.  can easily read and write a sentence.  alert and oriented x 3.     Assessment:   Medicare  wellness utd on preventive parameters    Plan:   During the course of the visit the patient was educated and counseled about appropriate screening and preventive services including:       Fall prevention   Screening mammography  Bone densitometry screening  Diabetes screening  Nutrition counseling   Vaccines / LABS Zostavax / Pneumococcal Vaccine  today  PSA  Patient Instructions (the written plan) was given to the patient.

## 2015-03-08 ENCOUNTER — Ambulatory Visit (AMBULATORY_SURGERY_CENTER): Payer: Medicare Other | Admitting: Internal Medicine

## 2015-03-08 ENCOUNTER — Encounter: Payer: Self-pay | Admitting: Internal Medicine

## 2015-03-08 VITALS — BP 115/68 | HR 83 | Temp 96.8°F | Resp 16 | Ht 74.75 in | Wt 265.0 lb

## 2015-03-08 DIAGNOSIS — C155 Malignant neoplasm of lower third of esophagus: Secondary | ICD-10-CM | POA: Diagnosis not present

## 2015-03-08 DIAGNOSIS — J449 Chronic obstructive pulmonary disease, unspecified: Secondary | ICD-10-CM | POA: Diagnosis not present

## 2015-03-08 DIAGNOSIS — K2100 Gastro-esophageal reflux disease with esophagitis, without bleeding: Secondary | ICD-10-CM

## 2015-03-08 DIAGNOSIS — R1013 Epigastric pain: Secondary | ICD-10-CM | POA: Diagnosis not present

## 2015-03-08 LAB — GLUCOSE, CAPILLARY
GLUCOSE-CAPILLARY: 68 mg/dL (ref 65–99)
Glucose-Capillary: 64 mg/dL — ABNORMAL LOW (ref 65–99)
Glucose-Capillary: 96 mg/dL (ref 65–99)
Glucose-Capillary: 118 mg/dL — ABNORMAL HIGH (ref 65–99)
Glucose-Capillary: 54 mg/dL — ABNORMAL LOW (ref 65–99)

## 2015-03-08 MED ORDER — SODIUM CHLORIDE 0.9 % IV SOLN: 500.0000 mL | INTRAVENOUS | Status: DC

## 2015-03-08 MED ORDER — OMEPRAZOLE 40 MG PO CPDR: 40.0000 mg | DELAYED_RELEASE_CAPSULE | Freq: Every day | ORAL | Status: DC

## 2015-03-08 MED ORDER — DEXTROSE 5 % IV SOLN: INTRAVENOUS | Status: DC

## 2015-03-08 NOTE — Progress Notes (Signed)
CBG upon admission =64,verbal order received via Dr. Henrene Pastor to hang D5W,Recheck CBG @ 0955 =96. D5W removed after receiving 250 ml and NS hung @ KVO.

## 2015-03-08 NOTE — Op Note (Signed)
[facility]  [address] [place], [address]   ENDOSCOPY PROCEDURE REPORT  PATIENT: Ronald Johnson, Ronald Johnson  MR#: [id number] BIRTHDATE: 1945/09/26 , 84  yrs. old GENDER: male ENDOSCOPIST: Eustace Quail, MD REFERRED BY:  .  Self / Office PROCEDURE DATE:  03/08/2015 PROCEDURE:  EGD w/ biopsy ASA CLASS:     Class II INDICATIONS:  epigastric pain. MEDICATIONS: Monitored anesthesia care and Propofol 160 mg IV TOPICAL ANESTHETIC: none  DESCRIPTION OF PROCEDURE: After the risks benefits and alternatives of the procedure were thoroughly explained, informed consent was obtained.  The LB [id number] endoscope was introduced through the mouth and advanced to the second portion of the duodenum , Without limitations.  The instrument was slowly withdrawn as the mucosa was fully examined.   EXAM:The distal esophagus revealed ulceration with slight fullness and nodularity.  Multiple biopsies taken.  The stomach revealed slight fullness of the mucosa in the region of the cardia. Otherwise normal stomach.  The duodenum was normal.  Retroflexed views revealed a hiatal hernia.     The scope was then withdrawn from the patient and the procedure completed.  COMPLICATIONS: There were no immediate complications.  ENDOSCOPIC IMPRESSION: 1.The distal esophagus revealed ulceration with slight fullness and nodularity.  Multiple biopsies taken. 2. GERD  RECOMMENDATIONS: 1.  Await biopsy results 2.  Prescribe omeprazole 40 mg daily; #30; 3 refills  REPEAT EXAM:  eSigned:  Eustace Quail, MD 03/08/2015 11:18 AM    CC:The Patient and Donavan Foil, MD

## 2015-03-08 NOTE — Patient Instructions (Addendum)
YOU HAD AN ENDOSCOPIC PROCEDURE TODAY AT THE [facility]:   Refer to the procedure report that was given to you for any specific questions about what was found during the examination.  If the procedure report does not answer your questions, please call your gastroenterologist to clarify.  If you requested that your care partner not be given the details of your procedure findings, then the procedure report has been included in a sealed envelope for you to review at your convenience later.  YOU SHOULD EXPECT: Some feelings of bloating in the abdomen. Passage of more gas than usual.  Walking can help get rid of the air that was put into your GI tract during the procedure and reduce the bloating. If you had a lower endoscopy (such as a colonoscopy or flexible sigmoidoscopy) you may notice spotting of blood in your stool or on the toilet paper. If you underwent a bowel prep for your procedure, you may not have a normal bowel movement for a few days.  Please Note:  You might notice some irritation and congestion in your nose or some drainage.  This is from the oxygen used during your procedure.  There is no need for concern and it should clear up in a day or so.  SYMPTOMS TO REPORT IMMEDIATELY:    Following upper endoscopy (EGD)  Vomiting of blood or coffee ground material  New chest pain or pain under the shoulder blades  Painful or persistently difficult swallowing  New shortness of breath  Fever of 100F or higher  Black, tarry-looking stools  For urgent or emergent issues, a gastroenterologist can be reached at any hour by calling [phone].   DIET: Your first meal following the procedure should be a small meal and then it is ok to progress to your normal diet. Heavy or fried foods are harder to digest and may make you feel nauseous or bloated.  Likewise, meals heavy in dairy and vegetables can increase bloating.  Drink plenty of fluids but you should avoid alcoholic beverages  for 24 hours.  ACTIVITY:  You should plan to take it easy for the rest of today and you should NOT DRIVE or use heavy machinery until tomorrow (because of the sedation medicines used during the test).    FOLLOW UP: Our staff will call the number listed on your records the next business day following your procedure to check on you and address any questions or concerns that you may have regarding the information given to you following your procedure. If we do not reach you, we will leave a message.  However, if you are feeling well and you are not experiencing any problems, there is no need to return our call.  We will assume that you have returned to your regular daily activities without incident.  If any biopsies were taken you will be contacted by phone or by letter within the next 1-3 weeks.  Please call us at [phone] if you have not heard about the biopsies in 3 weeks.    SIGNATURES/CONFIDENTIALITY: You and/or your care partner have signed paperwork which will be entered into your electronic medical record.  These signatures attest to the fact that that the information above on your After Visit Summary has been reviewed and is understood.  Full responsibility of the confidentiality of this discharge information lies with you and/or your care-partner. 

## 2015-03-08 NOTE — Progress Notes (Signed)
Called to room to assist during endoscopic procedure.  Patient ID and intended procedure confirmed with present staff. Received instructions for my participation in the procedure from the performing physician.  

## 2015-03-08 NOTE — Progress Notes (Signed)
A/ox3, pleased with MAC, report to RN 

## 2015-03-08 NOTE — Progress Notes (Signed)
Patient taking additional 4 oz of cranberry juice. Patient stating he feels better. Blood sugar 68, asymptomatic. Patient eating a tootsie roll.

## 2015-03-08 NOTE — Progress Notes (Signed)
Blood sugar 54, patient stating he took 300 units of his insulin today at 0625. Patient stating he does not feel well, like his sugar is low and he needs to eat. Per prot ocal 6 glucose tablets given. Peanut butter and graham crackers and 4 oz of cranberry juice.

## 2015-03-09 ENCOUNTER — Telehealth: Payer: Self-pay | Admitting: *Deleted

## 2015-03-09 NOTE — Telephone Encounter (Signed)
Left message on f/u callback 

## 2015-03-14 ENCOUNTER — Other Ambulatory Visit: Payer: Self-pay

## 2015-03-14 DIAGNOSIS — C801 Malignant (primary) neoplasm, unspecified: Secondary | ICD-10-CM

## 2015-03-15 ENCOUNTER — Other Ambulatory Visit: Payer: Self-pay

## 2015-03-15 ENCOUNTER — Encounter (HOSPITAL_COMMUNITY): Payer: Self-pay | Admitting: *Deleted

## 2015-03-15 ENCOUNTER — Telehealth: Payer: Self-pay

## 2015-03-15 ENCOUNTER — Telehealth: Payer: Self-pay | Admitting: *Deleted

## 2015-03-15 DIAGNOSIS — C159 Malignant neoplasm of esophagus, unspecified: Secondary | ICD-10-CM

## 2015-03-15 DIAGNOSIS — C801 Malignant (primary) neoplasm, unspecified: Secondary | ICD-10-CM

## 2015-03-15 NOTE — Telephone Encounter (Signed)
-----   Message from Milus Banister, MD sent at 03/15/2015  7:57 AM EDT ----- Looks like my 8:30 cancelled for tomorrow, can you have Sky switch to that time from his current 1:15 spot?  Thanks

## 2015-03-15 NOTE — Anesthesia Preprocedure Evaluation (Addendum)
Anesthesia Evaluation  Patient identified by MRN, date of birth, ID band Patient awake    Reviewed: Allergy & Precautions, NPO status , Patient's Chart, lab work & pertinent test results  Airway Mallampati: II  TM Distance: >3 FB Neck ROM: Full    Dental no notable dental hx.    Pulmonary COPDformer smoker,  breath sounds clear to auscultation  Pulmonary exam normal       Cardiovascular hypertension, Pt. on medications Normal cardiovascular examRhythm:Regular Rate:Normal     Neuro/Psych PSYCHIATRIC DISORDERS Anxiety Depression  Neuromuscular disease    GI/Hepatic Neg liver ROS, hiatal hernia, GERD-  ,  Endo/Other  negative endocrine ROSdiabetes, Type 1, Insulin Dependent  Renal/GU Renal disease  negative genitourinary   Musculoskeletal  (+) Arthritis -,   Abdominal   Peds negative pediatric ROS (+)  Hematology  (+) anemia ,   Anesthesia Other Findings   Reproductive/Obstetrics negative OB ROS                            Anesthesia Physical Anesthesia Plan  ASA: III  Anesthesia Plan: MAC   Post-op Pain Management:    Induction: Intravenous  Airway Management Planned: Natural Airway  Additional Equipment:   Intra-op Plan:   Post-operative Plan:   Informed Consent: I have reviewed the patients History and Physical, chart, labs and discussed the procedure including the risks, benefits and alternatives for the proposed anesthesia with the patient or authorized representative who has indicated his/her understanding and acceptance.   Dental advisory given  Plan Discussed with: CRNA  Anesthesia Plan Comments:         Anesthesia Quick Evaluation

## 2015-03-15 NOTE — Progress Notes (Signed)
You have been scheduled for a CT scan of the chest at [facility] ([address]---this is in the same building as [facility]).   You are scheduled on 03/20/15 at 230 pm . You should arrive 15 minutes prior to your appointment time for registration. Please follow the written instructions below on the day of your exam:  WARNING: IF YOU ARE ALLERGIC TO IODINE/X-RAY DYE, PLEASE NOTIFY RADIOLOGY IMMEDIATELY AT [phone]! YOU WILL BE GIVEN A 13 HOUR PREMEDICATION PREP.  1) Do not eat or drink any solids  after 1230 pm (2 hours prior to your test)  Will need BUN and Creat on arrival to [facility]

## 2015-03-15 NOTE — Telephone Encounter (Signed)
Left VM for new appointment with Dr. [name] on 03/21/15 to arrive at 1:30 pm. Requested he return call to confirm and ask for GI Navigator.

## 2015-03-16 ENCOUNTER — Encounter (HOSPITAL_COMMUNITY)
Admission: RE | Disposition: A | Discharge status: Home / Self Care | Payer: Self-pay | Source: Ambulatory Visit | Attending: Gastroenterology

## 2015-03-16 ENCOUNTER — Ambulatory Visit (HOSPITAL_COMMUNITY)
Admission: RE | Admit: 2015-03-16 | Discharge: 2015-03-16 | Disposition: A | Discharge status: Home / Self Care | Payer: Medicare Other | Source: Ambulatory Visit | Attending: Gastroenterology | Admitting: Gastroenterology

## 2015-03-16 ENCOUNTER — Ambulatory Visit (HOSPITAL_COMMUNITY): Payer: Medicare Other | Admitting: Anesthesiology

## 2015-03-16 ENCOUNTER — Encounter (HOSPITAL_COMMUNITY): Payer: Self-pay | Admitting: Certified Registered Nurse Anesthetist

## 2015-03-16 ENCOUNTER — Telehealth: Payer: Self-pay | Admitting: Oncology

## 2015-03-16 DIAGNOSIS — C160 Malignant neoplasm of cardia: Secondary | ICD-10-CM | POA: Insufficient documentation

## 2015-03-16 DIAGNOSIS — F419 Anxiety disorder, unspecified: Secondary | ICD-10-CM | POA: Diagnosis not present

## 2015-03-16 DIAGNOSIS — Z7982 Long term (current) use of aspirin: Secondary | ICD-10-CM | POA: Diagnosis not present

## 2015-03-16 DIAGNOSIS — Z79899 Other long term (current) drug therapy: Secondary | ICD-10-CM | POA: Insufficient documentation

## 2015-03-16 DIAGNOSIS — E780 Pure hypercholesterolemia: Secondary | ICD-10-CM | POA: Insufficient documentation

## 2015-03-16 DIAGNOSIS — F329 Major depressive disorder, single episode, unspecified: Secondary | ICD-10-CM | POA: Diagnosis not present

## 2015-03-16 DIAGNOSIS — E1142 Type 2 diabetes mellitus with diabetic polyneuropathy: Secondary | ICD-10-CM | POA: Diagnosis not present

## 2015-03-16 DIAGNOSIS — K222 Esophageal obstruction: Secondary | ICD-10-CM | POA: Diagnosis not present

## 2015-03-16 DIAGNOSIS — I10 Essential (primary) hypertension: Secondary | ICD-10-CM | POA: Insufficient documentation

## 2015-03-16 DIAGNOSIS — G894 Chronic pain syndrome: Secondary | ICD-10-CM | POA: Insufficient documentation

## 2015-03-16 DIAGNOSIS — N529 Male erectile dysfunction, unspecified: Secondary | ICD-10-CM | POA: Insufficient documentation

## 2015-03-16 DIAGNOSIS — Z794 Long term (current) use of insulin: Secondary | ICD-10-CM | POA: Insufficient documentation

## 2015-03-16 DIAGNOSIS — J449 Chronic obstructive pulmonary disease, unspecified: Secondary | ICD-10-CM | POA: Diagnosis not present

## 2015-03-16 DIAGNOSIS — E1151 Type 2 diabetes mellitus with diabetic peripheral angiopathy without gangrene: Secondary | ICD-10-CM | POA: Diagnosis not present

## 2015-03-16 DIAGNOSIS — Z7951 Long term (current) use of inhaled steroids: Secondary | ICD-10-CM | POA: Insufficient documentation

## 2015-03-16 DIAGNOSIS — N289 Disorder of kidney and ureter, unspecified: Secondary | ICD-10-CM | POA: Insufficient documentation

## 2015-03-16 DIAGNOSIS — Z87891 Personal history of nicotine dependence: Secondary | ICD-10-CM | POA: Diagnosis not present

## 2015-03-16 DIAGNOSIS — M1990 Unspecified osteoarthritis, unspecified site: Secondary | ICD-10-CM | POA: Insufficient documentation

## 2015-03-16 DIAGNOSIS — K219 Gastro-esophageal reflux disease without esophagitis: Secondary | ICD-10-CM | POA: Diagnosis not present

## 2015-03-16 DIAGNOSIS — C801 Malignant (primary) neoplasm, unspecified: Secondary | ICD-10-CM

## 2015-03-16 DIAGNOSIS — E1121 Type 2 diabetes mellitus with diabetic nephropathy: Secondary | ICD-10-CM | POA: Insufficient documentation

## 2015-03-16 HISTORY — PX: EUS: SHX5427

## 2015-03-16 LAB — GLUCOSE, CAPILLARY: GLUCOSE-CAPILLARY: 214 mg/dL — AB (ref 65–99)

## 2015-03-16 LAB — CREATININE, SERUM
CREATININE: 1.26 mg/dL — AB (ref 0.61–1.24)
GFR calc Af Amer: >60 mL/min (ref >60)
GFR calc non Af Amer: 58 mL/min — ABNORMAL LOW (ref >60)

## 2015-03-16 LAB — BUN: BUN: 27 mg/dL — ABNORMAL HIGH (ref 6–20)

## 2015-03-16 SURGERY — UPPER ENDOSCOPIC ULTRASOUND (EUS) RADIAL
Anesthesia: Monitor Anesthesia Care

## 2015-03-16 MED ORDER — SODIUM CHLORIDE 0.9 % IV SOLN: INTRAVENOUS | Status: DC

## 2015-03-16 MED ORDER — BUTAMBEN-TETRACAINE-BENZOCAINE 2-2-14 % EX AERO
INHALATION_SPRAY | CUTANEOUS | Status: DC | PRN
  Administered 2015-03-16: 2 via TOPICAL

## 2015-03-16 MED ORDER — HYDROCODONE-ACETAMINOPHEN 5-325 MG PO TABS: 1.0000 | ORAL_TABLET | Freq: Four times a day (QID) | ORAL | Status: DC | PRN

## 2015-03-16 MED ORDER — PROPOFOL 10 MG/ML IV BOLUS
INTRAVENOUS | Status: AC
  Filled 2015-03-16: qty 20

## 2015-03-16 MED ORDER — LACTATED RINGERS IV SOLN
INTRAVENOUS | Status: DC
  Administered 2015-03-16: 1000 mL via INTRAVENOUS

## 2015-03-16 MED ORDER — PROPOFOL 10 MG/ML IV BOLUS
INTRAVENOUS | Status: DC | PRN
  Administered 2015-03-16: 20 mg via INTRAVENOUS
  Administered 2015-03-16: 40 mg via INTRAVENOUS
  Administered 2015-03-16: 20 mg via INTRAVENOUS
  Administered 2015-03-16 (×3): 40 mg via INTRAVENOUS
  Administered 2015-03-16: 20 mg via INTRAVENOUS

## 2015-03-16 NOTE — H&P (View-Only) (Signed)
Subjective:    Patient ID: Ronald Johnson, male    DOB: Apr 19, 1946, 69 y.o.   MRN: [id number]  HPI Pt returns for f/u of diabetes mellitus: DM type: Insulin-requiring type 2.  Dx'ed: 5573 Complications: polyneuropathy, PAD, and nephropathy.  Therapy: insulin since 2004.  DKA: never.  Severe hypoglycemia: never. Pancreatitis: never. Other: he changed to qd insulin, after he did not achieve good control on multiple daily injections; he cannot afford insulin analogs.  Interval history:  no cbg record, but states cbg's vary from 120-300.  There is no trend throughout the day.  He seldom has hypoglycemia, and these episodes are mild.  This happens with exertion.   Past Medical History  Diagnosis Date  . COLONIC POLYPS, ADENOMATOUS 08/01/2008    09/01/2012 also  . DIABETES MELLITUS, TYPE I 03/13/2007  . HYPERCHOLESTEROLEMIA 02/03/2008  . ANEMIA 08/15/2009  . Anxiety state, unspecified 12/15/2008  . DEPRESSION 03/13/2007  . HYPERTENSION 03/13/2007  . PULMONARY NODULE 11/24/2008  . GERD 03/13/2007  . TOBACCO ABUSE 11/24/2008  . EMPHYSEMA 11/08/2008  . Prostatism   . ED (erectile dysfunction)   . [name]'s esophagus   . Hiatal hernia   . Diverticulitis   . Arthritis   . COPD (chronic obstructive pulmonary disease)     Past Surgical History  Procedure Laterality Date  . Egd  11/19/2005  . Foot fracture surgery Right 1980    right foot w/ pins and screws  . Removal pins and screws foot  1980    right foot  . Upper gastrointestinal endoscopy    . Circumcision      History   Social History  . Marital Status: Married    Spouse Name: N/A  . Number of Children: 2  . Years of Education: N/A   Occupational History  . INSPECTOR     works Youth worker   Social History Main Topics  . Smoking status: Former Smoker -- 1.00 packs/day for 35 years    Types: Cigarettes    Quit date: 10/31/2008  . Smokeless tobacco: Never Used  . Alcohol Use: 0.0 oz/week    0 Standard drinks or  equivalent per week     Comment: 2/month only on vacation  . Drug Use: No  . Sexual Activity: No   Other Topics Concern  . Not on file   Social History Narrative   Married with children    Current Outpatient Prescriptions on File Prior to Visit  Medication Sig Dispense Refill  . aspirin 81 MG tablet Take 81 mg by mouth daily.      Marland Kitchen atorvastatin (LIPITOR) 10 MG tablet TAKE ONE TABLET BY MOUTH EVERY DAY 180 tablet 1  . diazepam (VALIUM) 5 MG tablet Take 1 tablet (5 mg total) by mouth 2 (two) times daily as needed. 180 tablet 1  . Fluticasone Furoate-Vilanterol 100-25 MCG/INH AEPB Inhale 1 puff into the lungs daily. 1 each 0  . glucose blood (PRECISION XTRA TEST STRIPS) test strip Check blood sugar three times a day dx 250.01 270 each 3  . Insulin Syringe-Needle U-100 30G X 3/8" 1 ML MISC Use to inject insulin 3 times per day. 300 each 2  . losartan-hydrochlorothiazide (HYZAAR) 100-12.5 MG per tablet Take 1 tablet by mouth  daily 90 tablet 2  . sildenafil (REVATIO) 20 MG tablet TAKE 1-5 TABLETS BY MOUTH ONCE DAILY AS NEEDED 50 tablet 1  . traMADol (ULTRAM) 50 MG tablet Take 1 tablet (50 mg total) by mouth every 6 (six)  hours as needed. 30 tablet 0  . [DISCONTINUED] metoprolol succinate (TOPROL-XL) 25 MG 24 hr tablet Take 25 mg by mouth daily.      No current facility-administered medications on file prior to visit.    No Known Allergies  Family History  Problem Relation Age of Onset  . Stroke Mother   . Colon cancer Neg Hx   . Diabetes Mother   . Diabetes Maternal Grandmother     mother side of the family aunts, MGF    BP 132/72 mmHg  Pulse 76  Temp(Src) 97.6 F (36.4 C) (Oral)  Ht 6' 2.75" (1.899 m)  Wt 267 lb (121.11 kg)  BMI 33.58 kg/m2  SpO2 93%   Review of Systems Chronic sxs of epigastric pain and depression both persist.  Cymbalta did not help.  He does not recall what happened with effexor.       Objective:   Physical Exam VITAL SIGNS:  See vs  page GENERAL: no distress Pulses: dorsalis pedis intact bilat.   MSK: no deformity of the feet CV: no leg edema Skin:  no ulcer on the feet.  normal color and temp on the feet. Neuro: sensation is intact to touch on the feet.   A1c=8.0%    Assessment & Plan:  DM: he needs increased rx.  Please increase the insulin to 330 units each morning.  If you are going to be active, take just 200 units that morning.  Depression: persistent.  i have sent a prescription to your pharmacy, for effexor.  This is probably not enough, so please call us in 2 weeks, to tell us how you feel.   Chronic pain syndrome, persistent.  Due to an interaction, you should minimize tramadol while taking this.    Subjective:   Patient here for Medicare annual wellness visit and management of other chronic and acute problems.     Risk factors: advanced age    60 of Physicians Providing Medical Care to Patient:  See "snapshot"   Activities of Daily Living: In your present state of health, do you have any difficulty performing the following activities?:  Preparing food and eating?: No  Bathing yourself: No  Getting dressed: No  Using the toilet:No  Moving around from place to place: No  In the past year have you fallen or had a near fall?:No    Home Safety: Has smoke detector and wears seat belts. Firearms are safely stored. No excess sun exposure.    Diet and Exercise  Current exercise habits: pt says good.   Dietary issues discussed: pt says his diet is not healthy.    Depression Screen  Q1: Over the past two weeks, have you felt down, depressed or hopeless? yes  Q2: Over the past two weeks, have you felt little interest or pleasure in doing things? yes  The following portions of the patient's history were reviewed and updated as appropriate: allergies, current medications, past family history, past medical history, past social history, past surgical history and problem list.   Review of Systems   Denies hearing loss, and visual loss.  Objective:   Vision:  Sees opthalmologist Hearing: grossly normal Body mass index:  See vs page Msk: pt easily and quickly performs "get-up-and-go" from a sitting position Cognitive Impairment Assessment: cognition, memory and judgment appear normal.  remembers 3/3 at 5 minutes.  excellent recall.  can easily read and write a sentence.  alert and oriented x 3.     Assessment:   Medicare  wellness utd on preventive parameters    Plan:   During the course of the visit the patient was educated and counseled about appropriate screening and preventive services including:       Fall prevention   Screening mammography  Bone densitometry screening  Diabetes screening  Nutrition counseling   Vaccines / LABS Zostavax / Pneumococcal Vaccine  today  PSA  Patient Instructions (the written plan) was given to the patient.

## 2015-03-16 NOTE — Interval H&P Note (Signed)
History and Physical Interval Note:  03/16/2015 11:21 AM  Ronald Johnson  has presented today for surgery, with the diagnosis of newly diagnosed GE junction adenocarcinoma  The various methods of treatment have been discussed with the patient and family. After consideration of risks, benefits and other options for treatment, the patient has consented to  Procedure(s): UPPER ENDOSCOPIC ULTRASOUND (EUS) RADIAL (N/A) as a surgical intervention .  The patient's history has been reviewed, patient examined, no change in status, stable for surgery.  I have reviewed the patient's chart and labs.  Questions were answered to the patient's satisfaction.     Milus Banister

## 2015-03-16 NOTE — Discharge Instructions (Signed)
YOU HAD AN ENDOSCOPIC PROCEDURE TODAY: Refer to the procedure report that was given to you for any specific questions about what was found during the examination.  If the procedure report does not answer your questions, please call your gastroenterologist to clarify.  YOU SHOULD EXPECT: Some feelings of bloating in the abdomen. Passage of more gas than usual.  Walking can help get rid of the air that was put into your GI tract during the procedure and reduce the bloating. If you had a lower endoscopy (such as a colonoscopy or flexible sigmoidoscopy) you may notice spotting of blood in your stool or on the toilet paper.   DIET: Your first meal following the procedure should be a light meal and then it is ok to progress to your normal diet.  A half-sandwich or bowl of soup is an example of a good first meal.  Heavy or fried foods are harder to digest and may make you feel nasueas or bloated.  Drink plenty of fluids but you should avoid alcoholic beverages for 24 hours.  ACTIVITY: Your care partner should take you home directly after the procedure.  You should plan to take it easy, moving slowly for the rest of the day.  You can resume normal activity the day after the procedure however you should NOT DRIVE or use heavy machinery for 24 hours (because of the sedation medicines used during the test).    SYMPTOMS TO REPORT IMMEDIATELY  A gastroenterologist can be reached at any hour.  Please call your doctor's office for any of the following symptoms:   Following lower endoscopy (colonoscopy, flexible sigmoidoscopy)  Excessive amounts of blood in the stool  Significant tenderness, worsening of abdominal pains  Swelling of the abdomen that is new, acute  Fever of 100 or higher  Following upper endoscopy (EGD, EUS, ERCP)  Vomiting of blood or coffee ground material  New, significant abdominal pain  New, significant chest pain or pain under the shoulder blades  Painful or persistently difficult  swallowing  New shortness of breath  Black, tarry-looking stools  FOLLOW UP: If any biopsies were taken you will be contacted by phone or by letter within the next 1-3 weeks.  Call your gastroenterologist if you have not heard about the biopsies in 3 weeks.  Please also call your gastroenterologist's office with any specific questions about appointments or follow up tests. Esophagogastroduodenoscopy Care After Refer to this sheet in the next few weeks. These instructions provide you with information on caring for yourself after your procedure. Your caregiver may also give you more specific instructions. Your treatment has been planned according to current medical practices, but problems sometimes occur. Call your caregiver if you have any problems or questions after your procedure.  HOME CARE INSTRUCTIONS  Do not eat or drink anything until the numbing medicine (local anesthetic) has worn off and your gag reflex has returned. You will know that the local anesthetic has worn off when you can swallow comfortably.  Do not drive for 12 hours after the procedure or as directed by your caregiver.  Only take medicines as directed by your caregiver. SEEK MEDICAL CARE IF:   You cannot stop coughing.  You are not urinating at all or less than usual. SEEK IMMEDIATE MEDICAL CARE IF:  You have difficulty swallowing.  You cannot eat or drink.  You have worsening throat or chest pain.  You have dizziness, lightheadedness, or you faint.  You have nausea or vomiting.  You have chills.  You have a fever.  You have severe abdominal pain.  You have black, tarry, or bloody stools. Document Released: 07/22/2012 Document Reviewed: 07/22/2012 [facility] Patient Information 2015 [place]. This information is not intended to replace advice given to you by your health care provider. Make sure you discuss any questions you have with your health care provider.

## 2015-03-16 NOTE — Telephone Encounter (Signed)
new patient appt-s/w patient and gave np appt for 08/09 @ 1:30 w/Dr. [name] Referring Dr. Scarlette Shorts Dx-adenocarcinoma of esophagus

## 2015-03-16 NOTE — Transfer of Care (Signed)
Immediate Anesthesia Transfer of Care Note  Patient: Ronald Johnson  Procedure(s) Performed: Procedure(s): UPPER ENDOSCOPIC ULTRASOUND (EUS) RADIAL (N/A)  Patient Location: PACU and Endoscopy Unit  Anesthesia Type:MAC  Level of Consciousness: awake, oriented, patient cooperative, lethargic and responds to stimulation  Airway & Oxygen Therapy: Patient Spontanous Breathing and Patient connected to face mask oxygen  Post-op Assessment: Report given to RN, Post -op Vital signs reviewed and stable and Patient moving all extremities  Post vital signs: Reviewed and stable  Last Vitals:  Filed Vitals:   03/16/15 1104  BP: 161/75  Pulse: 68  Temp: 36.4 C  Resp: 21    Complications: No apparent anesthesia complications

## 2015-03-16 NOTE — Op Note (Signed)
[facility] [place], [address]   ENDOSCOPIC ULTRASOUND PROCEDURE REPORT  PATIENT: Ronald Johnson, Ronald Johnson  MR#: [id number] BIRTHDATE: 05-24-1946  GENDER: male ENDOSCOPIST: Milus Banister, MD REFERRED BY:  Eustace Quail, M.D. PROCEDURE DATE:  03/16/2015 PROCEDURE:   Upper EUS ASA CLASS:      Class III INDICATIONS:   1.  recently diagnosed GE junction adenocarcinoma, no obvious spread on abd/pelvic CT; awaiting chest CT. MEDICATIONS: Monitored anesthesia care  DESCRIPTION OF PROCEDURE:   After the risks benefits and alternatives of the procedure were  explained, informed consent was obtained. The patient was then placed in the left, lateral, decubitus postion and IV sedation was administered. Throughout the procedure, the patients blood pressure, pulse and oxygen saturations were monitored continuously.  Under direct visualization, the EUS scope  endoscope was introduced through the mouth  and advanced to the stomach antrum .  Water was used as necessary to provide an acoustic interface.  Upon completion of the imaging, water was removed and the patient was sent to the recovery room in satisfactory condition.  Endoscopic findings: 1. The GE junction was nodular, ulcerated and friable.  There was a mild stricture that allowed passage of the larger diameter radial echoendoscope with only mild resistance. 2. The upper GI tract was otherwise normal  EUS findings: 1. There was obvious hypoechoic, non-circumferential, eccentric mass at the GE junction. This was approximately 4 cm long and the bulk of the mass lay on the gastric side of the GE junction. The mass was 1.2 cm thick maximally.  It was 3.7 cm across.  The mass clearly invades into and through muscularis propria (uT3). 2. There was a small but suspicious paraesophageal lymph node located 2 cm proximal to the mass this was 6.4 mm in diameter, round, hypoechoic, discrete (uN1).  ENDOSCOPIC  IMPRESSION: 4cm long, 3.7cm across eccentric (non-circumferential) uT3N1 (Stage IIIA) GE junction adenocarinoma. The bulk of the tumor lays on the gastric side of the GE junction.  RECOMMENDATIONS: He will likely benefit from neoadjuvant chemo/XRT.  _______________________________ eSignedMilus Banister, MD 03/16/2015 11:56 AM   CC: Scarlette Shorts, MD; Illene Regulus, MD. Darius Bump, MD

## 2015-03-16 NOTE — Anesthesia Postprocedure Evaluation (Signed)
  Anesthesia Post-op Note  Patient: [name]  Procedure(s) Performed: Procedure(s) (LRB): UPPER ENDOSCOPIC ULTRASOUND (EUS) RADIAL (N/A)  Patient Location: PACU  Anesthesia Type: MAC  Level of Consciousness: awake and alert   Airway and Oxygen Therapy: Patient Spontanous Breathing  Post-op Pain: mild  Post-op Assessment: Post-op Vital signs reviewed, Patient's Cardiovascular Status Stable, Respiratory Function Stable, Patent Airway and No signs of Nausea or vomiting  Last Vitals:  Filed Vitals:   03/16/15 1206  BP: 145/80  Pulse: 70  Temp: 36.6 C  Resp: 18    Post-op Vital Signs: stable   Complications: No apparent anesthesia complications

## 2015-03-17 ENCOUNTER — Encounter (HOSPITAL_COMMUNITY): Payer: Self-pay | Admitting: Gastroenterology

## 2015-03-20 ENCOUNTER — Inpatient Hospital Stay: Admission: RE | Admit: 2015-03-20 | Payer: Medicare Other | Source: Ambulatory Visit

## 2015-03-21 ENCOUNTER — Ambulatory Visit: Payer: Medicare Other | Admitting: Oncology

## 2015-03-21 ENCOUNTER — Ambulatory Visit: Payer: Medicare Other

## 2015-03-27 ENCOUNTER — Ambulatory Visit (INDEPENDENT_AMBULATORY_CARE_PROVIDER_SITE_OTHER)
Admission: RE | Admit: 2015-03-27 | Discharge: 2015-03-27 | Disposition: A | Discharge status: Home / Self Care | Payer: Medicare Other | Source: Ambulatory Visit | Attending: Gastroenterology | Admitting: Gastroenterology

## 2015-03-27 DIAGNOSIS — R918 Other nonspecific abnormal finding of lung field: Secondary | ICD-10-CM | POA: Diagnosis not present

## 2015-03-27 DIAGNOSIS — C801 Malignant (primary) neoplasm, unspecified: Secondary | ICD-10-CM

## 2015-03-27 DIAGNOSIS — C160 Malignant neoplasm of cardia: Secondary | ICD-10-CM | POA: Diagnosis not present

## 2015-03-27 MED ORDER — IOHEXOL 300 MG/ML  SOLN
80.0000 mL | Freq: Once | INTRAMUSCULAR | Status: AC | PRN
  Administered 2015-03-27: 80 mL via INTRAVENOUS

## 2015-03-28 ENCOUNTER — Encounter: Payer: Self-pay | Admitting: Oncology

## 2015-03-28 ENCOUNTER — Encounter: Payer: Self-pay | Admitting: *Deleted

## 2015-03-28 ENCOUNTER — Ambulatory Visit: Payer: Medicare Other

## 2015-03-28 ENCOUNTER — Ambulatory Visit (HOSPITAL_BASED_OUTPATIENT_CLINIC_OR_DEPARTMENT_OTHER): Payer: Medicare Other | Admitting: Oncology

## 2015-03-28 ENCOUNTER — Telehealth: Payer: Self-pay | Admitting: Oncology

## 2015-03-28 VITALS — BP 145/69 | HR 84 | Temp 98.2°F | Resp 19 | Ht 76.0 in | Wt 261.1 lb

## 2015-03-28 DIAGNOSIS — E119 Type 2 diabetes mellitus without complications: Secondary | ICD-10-CM

## 2015-03-28 DIAGNOSIS — F329 Major depressive disorder, single episode, unspecified: Secondary | ICD-10-CM

## 2015-03-28 DIAGNOSIS — J449 Chronic obstructive pulmonary disease, unspecified: Secondary | ICD-10-CM | POA: Diagnosis not present

## 2015-03-28 DIAGNOSIS — C159 Malignant neoplasm of esophagus, unspecified: Secondary | ICD-10-CM

## 2015-03-28 DIAGNOSIS — C160 Malignant neoplasm of cardia: Secondary | ICD-10-CM | POA: Diagnosis not present

## 2015-03-28 MED ORDER — MIRTAZAPINE 15 MG PO TABS
15.0000 mg | ORAL_TABLET | Freq: Every day | ORAL | Status: DC
Start: 2015-03-28 — End: 2015-04-27

## 2015-03-28 MED ORDER — PROCHLORPERAZINE MALEATE 10 MG PO TABS: 10.0000 mg | ORAL_TABLET | Freq: Four times a day (QID) | ORAL | Status: DC | PRN

## 2015-03-28 MED ORDER — DEXAMETHASONE 2 MG PO TABS: 10.0000 mg | ORAL_TABLET | ORAL | Status: DC

## 2015-03-28 NOTE — Telephone Encounter (Signed)
per pof to sch pt appt-sent MW email to sch pt trmt-sch referrals to Social work, Nutrition-radiation sch-gave pt copy of avs-adv pt could pick up completed sch after chemo class on 8/18

## 2015-03-28 NOTE — Progress Notes (Signed)
Oncology Nurse Navigator Documentation  Oncology Nurse Navigator Flowsheets 03/28/2015  Referral date to RadOnc/MedOnc 03/13/2015  Navigator Encounter Type Initial MedOnc  Patient Visit Type Medonc  Treatment Phase Treatment planning  Barriers/Navigation Needs Family concerns;Education  Education Coping with Diagnosis/ Prognosis;Newly Diagnosed Cancer Education  Interventions Referrals;Education Method  Referrals Social Work;Nutrition/dietician;ACS  Education Method Verbal;Written;Teach-back  Support Groups/Services GI  Time Spent with Patient 105  Met with patient and his wife during new patient visit. Explained the role of the GI Nurse Navigator and provided New Patient Packet with information on: 1. Esophageal cancer 2. Support groups 3. Advanced Directives 4. Fall Safety Plan Answered questions, reviewed current treatment plan using TEACH back and provided emotional support. Provided copy of current treatment plan.  Referral to ACS for [place] and will attend chemo teaching class-explained class to him and wife. Reviewed diagnosis and treatment. Venous access looks adequate for #5 cycles of chemo of Taxol/Carbo. Caide tells RN that he needs something for depression-Effexor XR made him unable to sleep. He denies any suicidal ideation,but is wanting antidepressant and to see CSW for tips on coping. He reports his past coping skill is to "sleep". Has not been able to sleep. Encouraged him to try to stay active and find a goal to accomplish every day. Antidepressant Remeron may help his sleep and mood.  Merceda Elks, RN, BSN GI Oncology [place]

## 2015-03-28 NOTE — Progress Notes (Signed)
Checked in new pt with no financial concerns prior to seeing the dr.  [name] has 2 insurances so financial assistance may not be needed but he has Shauna's card for any billing questions or concerns.

## 2015-03-28 NOTE — Progress Notes (Signed)
[facility] Patient Consult   Referring MD: Donnelle Olmeda 69 y.o.  10-Feb-1946    Reason for Referral: GE junction carcinoma   HPI: Mr. Decelles reports subxiphoid discomfort beginning in March of this year. He notes pain after eating firm food. He does not have pain at other times. He reports undergoing a negative cardiology and pulmonary evaluation. He was referred to Dr. Henrene Pastor and an abdominal ultrasound 02/08/2015 revealed a normal biliary system and liver. Poor visualization of the pancreas. A CT of the abdomen and pelvis 02/17/2015 revealed a normal pancreas. Bibasilar pulmonary nodules were unchanged compared to prior chest CTs. Mild distal esophageal wall thickening was noted. No mass.  He was taken to an upper endoscopy by Dr. Henrene Pastor on 03/08/2015. Ulceration and nodularity were noted at the distal esophagus. Slight fullness of the mucosa at the gastric cardia was noted. The pathology [id number]) confirmed adenocarcinoma.  He was taken to an endoscopic ultrasound by Dr. Ardis Hughs on 03/16/2015. The GE junction was nodular, ulcerated, and friable.. A hypoechoic non-circumferential mass was noted at the GE junction with the bulk of the mass on the gastric side of the GE junction. The mass invaded into and through the muscular propria.. A small suspicious paraesophageal lymph node was located 2 cm proximal to the mass measuring 6.4 mm.  He was first restaging a CT of the chest on 03/27/2015. Circumferential wall thickening was noted at the distal esophagus. Scattered pulmonary nodules, unchanged from previous CTs. Changes of emphysema were noted. No change in left adrenal adenoma.  Mr. Tsuda has been referred to Dr. Servando Snare.   Past Medical History  Diagnosis Date  . COLONIC POLYPS, ADENOMATOUS 08/01/2008    09/01/2012 also  . DIABETES MELLITUS, TYPE I 03/13/2007  . HYPERCHOLESTEROLEMIA 02/03/2008  . ANEMIA 08/15/2009  . Anxiety state, unspecified  12/15/2008  . DEPRESSION 03/13/2007  . HYPERTENSION 03/13/2007  . PULMONARY NODULE 11/24/2008  . GERD 03/13/2007  . TOBACCO ABUSE 11/24/2008  . EMPHYSEMA 11/08/2008  . Prostatism   . ED (erectile dysfunction)   . [name]'s esophagus   . Hiatal hernia   . Diverticulitis   . Arthritis   . COPD (chronic obstructive pulmonary disease)     .  GE junction adenocarcinoma                                                                          03/08/2015  Past Surgical History  Procedure Laterality Date  . Egd  11/19/2005  . Foot fracture surgery Right 1980    right foot w/ pins and screws  . Removal pins and screws foot  1980    right foot  . Upper gastrointestinal endoscopy    . Circumcision    . Colonoscopy    . Polypectomy    . Vasectomy    . Eus N/A 03/16/2015    Procedure: UPPER ENDOSCOPIC ULTRASOUND (EUS) RADIAL;  Surgeon: Milus Banister, MD;  Location: [facility] ENDOSCOPY;  Service: Endoscopy;  Laterality: N/A;    Medications: Reviewed  Allergies: No Known Allergies  Family history: His brother was diagnosed with prostate cancer in his 7s. No other family history of cancer.  Social History:   He  lives in [place]. He is a retired Licensed conveyancer. He quit smoking cigarettes in 2010 after smoking for 50 years. He does not use [place]. No risk factor for HIV or hepatitis.     ROS:   Positives include: Subxiphoid pain after eating firm solid food, one episode of dark stool following the upper endoscopy/biopsy, "trigger thumb "on the left hand, depression  A complete ROS was otherwise negative.  Physical Exam:  Blood pressure 145/69, pulse 84, temperature 98.2 F (36.8 C), temperature source Oral, resp. rate 19, height 6\' 4"  (1.93 m), weight 261 lb 1.6 oz (118.434 kg), SpO2 95 %.  HEENT: Oropharynx without visible mass, neck without mass Lungs: Distant breath sounds, no respiratory distress Cardiac: Regular rate and rhythm Abdomen: No hepatosplenomegaly, nontender, no mass GU:  Testes without mass  Vascular: No leg edema Lymph nodes: No cervical, supraclavicular, axillary, or inguinal nodes Neurologic: Alert and oriented, the motor exam appears intact in the upper and lower extremities Skin: No rash, suntan Musculoskeletal: No spine tenderness   LAB:  CBC  Lab Results  Component Value Date   WBC 7.4 01/31/2015   HGB 12.4* 01/31/2015   HCT 36.4* 01/31/2015   MCV 91.9 01/31/2015   PLT 198.0 01/31/2015   NEUTROABS 4.6 01/31/2015     CMP     Imaging:  Ct Chest W Contrast  03/27/2015   CLINICAL DATA:  Subsequent encounter for adenocarcinoma of the GE junction.  EXAM: CT CHEST WITH CONTRAST  TECHNIQUE: Multidetector CT imaging of the chest was performed during intravenous contrast administration.  CONTRAST:  58mL OMNIPAQUE IOHEXOL 300 MG/ML  SOLN  COMPARISON:  11/02/2013.  05/14/2012.  FINDINGS: Mediastinum / Lymph Nodes: There is no axillary lymphadenopathy. No mediastinal lymphadenopathy. There is no hilar lymphadenopathy. Heart size is normal. Coronary artery calcification is noted. Circumferential wall thickening is noted in the distal esophagus.  Lungs / Pleura: Several scattered pulmonary nodules are evident. 2 mm posterior right upper lobe nodule is seen on image 23 series 3. 7 mm right lower lobe nodule is seen on image 38. Peripheral left lower lobe 5 mm nodule is seen on image 57. All 3 of these nodules were present on the previous 2 studies and are unchanged in the interval.  No focal airspace consolidation. No pulmonary edema or pleural effusion. Underlying changes of emphysema again noted.  MSK / Soft Tissues: Bone windows reveal no worrisome lytic or sclerotic osseous lesions.  Upper Abdomen: No change in the left adrenal nodule, previously characterized as benign adrenal adenoma. Right upper pole renal cyst also unchanged since 05/14/2012.  IMPRESSION: 1. Mild circumferential wall thickening in the distal esophagus in this patient with adenocarcinoma of  the GE junction. 2. Several small pulmonary nodules are unchanged over 3 years, consistent with benign disease such as scarring. No new pulmonary nodules to suggest pulmonary metastases. 3. Stable left adrenal nodule seen previously to represent benign adrenal adenoma.   Electronically Signed   By: Misty Stanley M.D.   On: 03/27/2015 14:36    CT chest 03/27/2015-reviewed   Assessment/Plan:   1. GE junction carcinoma-adenocarcinoma, status post an endoscopic biopsy 03/08/2015  EUS 03/16/2015 confirmed a clinical stage IIb (uT3,uN1) tumor  Staging CTs of the chest, abdomen, and pelvis with no evidence of metastatic disease  2.   Postprandial subxiphoid pain secondary to #1  3.   Diabetes  4.   COPD  5.   Depression  6.   Hypertension  7.   History of adenomatous colon  polyps  8.    History of diabetic related neuropathy, peripheral arterial disease, and nephropathy Disposition:   Mr. Aubry has been diagnosed with adenocarcinoma the GE junction. He appears to have disease localized to the GE junction and a paraesophageal lymph node. I discussed standard treatment of localized esophagogastric cancer with Mr. Trimm and his wife. I recommend neoadjuvant chemotherapy/radiation. This can be followed by surgical resection if he is deemed a surgical candidate. He has been referred to Dr. Servando Snare.  I recommend weekly Taxol/carboplatin chemotherapy to be given concurrent with radiation. We reviewed the potential toxicities associated with the Taxol/carboplatin regimen including the chance for nausea/vomiting, alopecia, an allergic reaction, and hematologic toxicity. We discussed the bone pain and neuropathy associated with Taxol. We discussed the potential for esophagitis with combined therapy. He will attend a chemotherapy teaching class. He agrees to proceed.  Mr. Alewine will be premedicated with home Decadron prior to the first cycle of Taxol. He understands the potential for hyperglycemia  with steroid-induced. He will monitor his blood sugar and contact us for blood sugar of greater than 400.  He is scheduled to see Dr. Valere Dross 04/06/2015. I anticipate starting radiation during the week of 04/17/2015. He will be scheduled for a first week of Taxol/carboplatin on 04/20/2015. Mr. Mignano will return for an office visit on 04/27/2015.  He requested an anti-depressing. I prescribed Remeron.  Approximately 50 minutes were spent with the patient today. The majority of the time was used for counseling and coordination of care.  [place], [place] 03/28/2015, 5:30 PM

## 2015-03-29 ENCOUNTER — Encounter: Payer: Medicare Other | Admitting: Cardiothoracic Surgery

## 2015-03-30 ENCOUNTER — Other Ambulatory Visit: Payer: Self-pay | Admitting: *Deleted

## 2015-03-30 ENCOUNTER — Encounter: Payer: Self-pay | Admitting: Cardiothoracic Surgery

## 2015-03-30 ENCOUNTER — Institutional Professional Consult (permissible substitution) (INDEPENDENT_AMBULATORY_CARE_PROVIDER_SITE_OTHER): Payer: Medicare Other | Admitting: Cardiothoracic Surgery

## 2015-03-30 VITALS — BP 130/70 | HR 96 | Resp 20 | Ht 76.0 in | Wt 265.0 lb

## 2015-03-30 DIAGNOSIS — C159 Malignant neoplasm of esophagus, unspecified: Secondary | ICD-10-CM

## 2015-03-30 DIAGNOSIS — C160 Malignant neoplasm of cardia: Secondary | ICD-10-CM

## 2015-03-30 NOTE — Progress Notes (Signed)
[address]       [place], [address]             [id number]                    Harlie C Siebers [facility] Medical Record #[id number] Date of Birth: 10-13-1945  Referring: Milus Banister, MD Primary Care: [name], MD  Chief Complaint:    Chief Complaint  Patient presents with  . Esophageal Cancer    Surgical eval, Chest CT 03/27/15, Endoscopy 03/08/15, Ednoscopic Ultrasound 03/16/15    History of Present Illness:    GLENDELL FOUSE 69 y.o. 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 The patient has a history of [name]'s esophagus dating back to biopsies in 2007. In the spring of this year he had begun having epigastric discomfort when eating and some mild trouble swallowing. A cardiac stress test was performed which was negative for ischemia endoscopy and ultimately endoscopic ultrasound were performed in late July, confirming adenocarcinoma the esophagus. The patient notes no weight loss in fact has gained approximately 15 pounds over the past 6 months. He had no blood in his stool until after the endoscopy.  The patient has known underlying pulmonary disease, he stopped smoking 6 years ago. 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  Previous screening PFTs revealed an FEV1 of 35% of predicted.    Current Activity/ Functional Status:  Patient is independent with mobility/ambulation, transfers, ADL's, IADL's.   Zubrod Score: At the time of surgery this patient's most appropriate activity status/level should be described as: [x]    0    Normal activity, no symptoms []    1    Restricted in physical strenuous activity but ambulatory, able to do out light work []    2    Ambulatory and capable of self care, unable to do work activities, up and about               >50 % of waking hours                              []     3    Only limited self care, in bed greater than 50% of waking hours []    4    Completely disabled, no self care, confined to bed or chair []    5    Moribund   Past Medical History  Diagnosis Date  . COLONIC POLYPS, ADENOMATOUS 08/01/2008    09/01/2012 also  . DIABETES MELLITUS, TYPE I 03/13/2007  . HYPERCHOLESTEROLEMIA 02/03/2008  . ANEMIA 08/15/2009  . Anxiety state, unspecified 12/15/2008  . DEPRESSION 03/13/2007  . HYPERTENSION 03/13/2007  . PULMONARY NODULE 11/24/2008  . GERD 03/13/2007  . TOBACCO ABUSE 11/24/2008  . EMPHYSEMA 11/08/2008  . Prostatism   . ED (erectile dysfunction)   . [name]'s esophagus   . Hiatal hernia   . Diverticulitis   . Arthritis   . COPD (chronic obstructive pulmonary disease)     Past Surgical History  Procedure Laterality Date  . Egd  11/19/2005  . Foot fracture surgery Right 1980    right foot w/ pins and screws  . Removal pins and screws foot  1980    right foot  .  Upper gastrointestinal endoscopy    . Circumcision    . Colonoscopy    . Polypectomy    . Vasectomy    . Eus N/A 03/16/2015    Procedure: UPPER ENDOSCOPIC ULTRASOUND (EUS) RADIAL;  Surgeon: Milus Banister, MD;  Location: [facility];  Service: Endoscopy;  Laterality: N/A;    Family History  Problem Relation Age of Onset  . Stroke Mother   . Diabetes Mother   . Colon cancer Neg Hx   . Diabetes Maternal Grandmother     mother side of the family aunts, MGF  . Breast cancer Paternal Grandmother     Social History   Social History  . Marital Status: Married    Spouse Name: N/A  . Number of Children: 2  . Years of Education: N/A   Occupational History  . INSPECTOR     works Youth worker   Social History Main Topics  . Smoking status: Former Smoker -- 1.00 packs/day for 35 years    Types: Cigarettes    Quit date: 10/31/2008  . Smokeless tobacco: Never Used  . Alcohol Use: 0.0 oz/week    0 Standard drinks or equivalent per week     Comment: 2/month only on  vacation  . Drug Use: No  . Sexual Activity: No   Other Topics Concern  . Not on file   Social History Narrative   Married, wife [place] with #2 grown children   Prior [employer], where he met his wife in [place]   Retired-prior work Personnel officer ADL's    History  Smoking status  . Former Smoker -- 1.00 packs/day for 35 years  . Types: Cigarettes  . Quit date: 10/31/2008  Smokeless tobacco  . Never Used    History  Alcohol Use  . 0.0 oz/week  . 0 Standard drinks or equivalent per week    Comment: 2/month only on vacation     No Known Allergies  Current Outpatient Prescriptions  Medication Sig Dispense Refill  . atorvastatin (LIPITOR) 10 MG tablet TAKE ONE TABLET BY MOUTH EVERY DAY 180 tablet 1  . dexamethasone (DECADRON) 2 MG tablet Take 5 tablets (10 mg total) by mouth as directed. Take #5 tablets night before 1st chemo and morning of 1st chemo 10 tablet 0  . diazepam (VALIUM) 5 MG tablet Take 1 tablet (5 mg total) by mouth 2 (two) times daily as needed. 180 tablet 1  . glucose blood (PRECISION XTRA TEST STRIPS) test strip Check blood sugar three times a day dx 250.01 270 each 3  . HYDROcodone-acetaminophen (NORCO/VICODIN) 5-325 MG per tablet Take 1 tablet by mouth every 6 (six) hours as needed for moderate pain. 60 tablet 0  . insulin NPH Human (NOVOLIN N RELION) 100 UNIT/ML injection Inject 3.3 mLs (330 Units total) into the skin every morning. And syringes 1/day 330 mL 3  . Insulin Syringe-Needle U-100 30G X 3/8" 1 ML MISC Use to inject insulin 3 times per day. 300 each 2  . losartan-hydrochlorothiazide (HYZAAR) 100-12.5 MG per tablet Take 1 tablet by mouth  daily 90 tablet 2  . mirtazapine (REMERON) 15 MG tablet Take 1 tablet (15 mg total) by mouth at bedtime. 30 tablet 1  . omeprazole (PRILOSEC) 40 MG capsule Take 1 capsule (40 mg total) by mouth daily. 30 capsule 3  . prochlorperazine (COMPAZINE) 10 MG tablet Take 1 tablet (10 mg total) by mouth  every 6 (six) hours as needed for nausea. 60 tablet 1  .  sildenafil (REVATIO) 20 MG tablet TAKE 1-5 TABLETS BY MOUTH ONCE DAILY AS NEEDED 50 tablet 1  . [DISCONTINUED] metoprolol succinate (TOPROL-XL) 25 MG 24 hr tablet Take 25 mg by mouth daily.      No current facility-administered medications for this visit.      Review of Systems:     Cardiac Review of Systems: Y or N  Chest Pain [    ]  Resting SOB [   ] Exertional SOB  [  ]  Orthopnea [  ]   Pedal Edema [   ]    Palpitations [  ] Syncope  [  ]   Presyncope [   ]  General Review of Systems: [Y] = yes [  ]=no Constitional: recent weight change [  ];  Wt loss over the last 3 months [   ] anorexia [  ]; fatigue [  ]; nausea [  ]; night sweats [  ]; fever [  ]; or chills [  ];          Dental: poor dentition[  ]; Last Dentist visit:   Eye : blurred vision [  ]; diplopia [   ]; vision changes [  ];  Amaurosis fugax[  ]; Resp: cough [  ];  wheezing[  ];  hemoptysis[  ]; shortness of breath[  ]; paroxysmal nocturnal dyspnea[  ]; dyspnea on exertion[  ]; or orthopnea[  ];  GI:  gallstones[  ], vomiting[  ];  dysphagia[  ]; melena[  ];  hematochezia [  ]; heartburn[  ];   Hx of  Colonoscopy[  ]; GU: kidney stones [  ]; hematuria[  ];   dysuria [  ];  nocturia[  ];  history of     obstruction [  ]; urinary frequency [  ]             Skin: rash, swelling[  ];, hair loss[  ];  peripheral edema[  ];  or itching[  ]; Musculosketetal: myalgias[  ];  joint swelling[  ];  joint erythema[  ];  joint pain[  ];  back pain[  ];  Heme/Lymph: bruising[  ];  bleeding[  ];  anemia[  ];  Neuro: TIA[  ];  headaches[  ];  stroke[  ];  vertigo[  ];  seizures[  ];   paresthesias[  ];  difficulty walking[  ];  Psych:depression[  ]; anxiety[  ];  Endocrine: diabetes[  ];  thyroid dysfunction[  ];  Immunizations: Flu up to date [  ]; Pneumococcal up to date [  ];  Other:  Physical Exam: BP 130/70 mmHg  Pulse 96  Resp 20  Ht 6' 4" (1.93 m)  Wt 265 lb (120.203  kg)  BMI 32.27 kg/m2  SpO2 95%  PHYSICAL EXAMINATION: General appearance: alert, cooperative, appears stated age and no distress Head: Normocephalic, without obvious abnormality, atraumatic Neck: no adenopathy, no carotid bruit, no JVD, supple, symmetrical, trachea midline and thyroid not enlarged, symmetric, no tenderness/mass/nodules Lymph nodes: Cervical, supraclavicular, and axillary nodes normal. Resp: clear to auscultation bilaterally Back: symmetric, no curvature. ROM normal. No CVA tenderness. Cardio: regular rate and rhythm, S1, S2 normal, no murmur, click, rub or gallop GI: soft, non-tender; bowel sounds normal; no masses,  no organomegaly Extremities: extremities normal, atraumatic, no cyanosis or edema and Homans sign is negative, no sign of DVT Neurologic: Grossly normal Patient has full DP and PT pulses bilaterally    Diagnostic Studies & Laboratory  data:     Recent Radiology Findings:   Ct Chest W Contrast  03/27/2015   CLINICAL DATA:  Subsequent encounter for adenocarcinoma of the GE junction.  EXAM: CT CHEST WITH CONTRAST  TECHNIQUE: Multidetector CT imaging of the chest was performed during intravenous contrast administration.  CONTRAST:  55m OMNIPAQUE IOHEXOL 300 MG/ML  SOLN  COMPARISON:  11/02/2013.  05/14/2012.  FINDINGS: Mediastinum / Lymph Nodes: There is no axillary lymphadenopathy. No mediastinal lymphadenopathy. There is no hilar lymphadenopathy. Heart size is normal. Coronary artery calcification is noted. Circumferential wall thickening is noted in the distal esophagus.  Lungs / Pleura: Several scattered pulmonary nodules are evident. 2 mm posterior right upper lobe nodule is seen on image 23 series 3. 7 mm right lower lobe nodule is seen on image 38. Peripheral left lower lobe 5 mm nodule is seen on image 57. All 3 of these nodules were present on the previous 2 studies and are unchanged in the interval.  No focal airspace consolidation. No pulmonary edema or  pleural effusion. Underlying changes of emphysema again noted.  MSK / Soft Tissues: Bone windows reveal no worrisome lytic or sclerotic osseous lesions.  Upper Abdomen: No change in the left adrenal nodule, previously characterized as benign adrenal adenoma. Right upper pole renal cyst also unchanged since 05/14/2012.  IMPRESSION: 1. Mild circumferential wall thickening in the distal esophagus in this patient with adenocarcinoma of the GE junction. 2. Several small pulmonary nodules are unchanged over 3 years, consistent with benign disease such as scarring. No new pulmonary nodules to suggest pulmonary metastases. 3. Stable left adrenal nodule seen previously to represent benign adrenal adenoma.   Electronically Signed   By: EMisty StanleyM.D.   On: 03/27/2015 14:36    PET scan pending  I have independently reviewed the above radiologic studies.  Recent Lab Findings: Lab Results  Component Value Date   WBC 7.4 01/31/2015   HGB 12.4* 01/31/2015   HCT 36.4* 01/31/2015   PLT 198.0 01/31/2015   GLUCOSE 239* 01/31/2015   CHOL 170 12/02/2014   TRIG * 12/02/2014    449.0 Triglyceride is over 400; calculations on Lipids are invalid.   HDL 32.40* 12/02/2014   LDLDIRECT 93.0 12/02/2014   LDLCALC 94 06/17/2014   ALT 18 01/31/2015   AST 18 01/31/2015   NA 134* 01/31/2015   K 4.4 01/31/2015   CL 103 01/31/2015   CREATININE 1.26* 03/16/2015   BUN 27* 03/16/2015   CO2 24 01/31/2015   TSH 2.50 12/02/2014   INR 1.16 09/10/2011   HGBA1C 8 0 03/03/2015   EUS: Endoscopic findings: 1. The GE junction was nodular, ulcerated and friable. There was a mild stricture that allowed passage of the larger diameter radial echoendoscope with only mild resistance. 2. The upper GI tract was otherwise normal EUS findings: 1. There was obvious hypoechoic, non-circumferential, eccentric mass at the GE junction. This was approximately 4 cm long and the bulk of the mass lay on the gastric side of the GE junction.  The mass was 1.2 cm thick maximally. It was 3.7 cm across. The mass clearly invades into and through muscularis propria (uT3). 2. There was a small but suspicious paraesophageal lymph node located 2 cm proximal to the mass this was 6.4 mm in diameter, round, hypoechoic, discrete (uN1). ENDOSCOPIC IMPRESSION: 4cm long, 3.7cm across eccentric (non-circumferential) uT3N1 (Stage IIIA) GE junction adenocarinoma. The bulk of the tumor lays on the gastric side of the GE junction.  PATH: Patient: LCHANTRY, HEADEN  C Collected: 03/08/2015 Client: [facility] Accession: [id number] Diagnosis Surgical [P], distal esophagus - ADENOCARCINOMA, SEE COMMENT. Microscopic Comment The adenocarcinoma involves at least lamina propria and is arising in the background of intestinal metaplasia associated high grade glandular dysplasia. The case is reviewed with Dr Lyndon Code who concurs. The case was discussed with Dr. Henrene Pastor on 03/13/15. (CRR:gt, 03/13/15) [place] RUND DO   2007: 1. ESOPHAGUS, BIOPSY: ESOPHAGEAL MUCOSA WITH INTESTINAL METAPLASIA CONSISTENT WITH [name]' S MUCOSA AND FOCAL GLANDULAR ATYPIA CONSISTENT WITH LOW GRADE GLANDULAR DYSPLASIA. NO HIGH GRADE DYSPLASIA OR MALIGNANCY IDENTIFIED.  2013: 5. Surgical [P], esophagus, bx - SQUAMOCOLUMNAR MUCOSA WITH SQUAMOUS EPITHELIAL CHANGES CONSISTENT WITH REFLUX RELATED INJURY. - NEGATIVE FOR INTESTINAL METAPLASIA WITH GOBLET CELLS ([name]'S ESOPHAGUS). - NEGATIVE FOR MORPHOLOGICAL FEATURES OF EOSINOPHILIC ESOPHAGITIS. - REACTIVE AND INFLAMED GLANDULAR/COLUMNAR MUCOSA.  Assessment / Plan:   1) 4cm long, 3.7cm across eccentric (non-circumferential) uT3N1 (Stage IIIA) GE junction adenocarinoma, [place] type III associated with [name]'s. PET scan is pending.  I concur with Dr. [name], proceeding with concomitant chemotherapy and radiation and then consider surgical resection 6-8 weeks after completion of radiation would offer the best chance of  survival. I discussed with the patient in general the treatment of esophageal cancer including aspects of the surgical treatment. I'll plan to see him back in one month. We consider surgical resection in October or early November. 2) known COPD, screening PFTs show FEV1 35% of predicted, will obtain full set of pulmonary function studies 3) isotope stress test was done recently without evidence of ischemia,      I  spent 40 minutes counseling the patient face to face and 50% or more the  time was spent in counseling and coordination of care. The total time spent in the appointment was 60 minutes.  [name] MD      [address] [place] [address] Office [phone]   Beeper [phone]  03/30/2015 11:00 AM

## 2015-03-31 ENCOUNTER — Telehealth: Payer: Self-pay | Admitting: *Deleted

## 2015-03-31 NOTE — Telephone Encounter (Signed)
Per staff message and POF I have scheduled appts. Advised scheduler of appts. JMW  

## 2015-04-03 ENCOUNTER — Encounter: Payer: Self-pay | Admitting: Radiation Oncology

## 2015-04-03 ENCOUNTER — Ambulatory Visit
Admission: RE | Admit: 2015-04-03 | Discharge: 2015-04-03 | Disposition: A | Discharge status: Home / Self Care | Payer: Medicare Other | Source: Ambulatory Visit | Admitting: Radiation Oncology

## 2015-04-03 ENCOUNTER — Ambulatory Visit
Admission: RE | Admit: 2015-04-03 | Discharge: 2015-04-03 | Disposition: A | Discharge status: Home / Self Care | Payer: Medicare Other | Source: Ambulatory Visit | Attending: Radiation Oncology | Admitting: Radiation Oncology

## 2015-04-03 VITALS — BP 117/60 | HR 91 | Temp 97.6°F | Resp 20 | Ht 76.0 in | Wt 265.9 lb

## 2015-04-03 DIAGNOSIS — Z510 Encounter for antineoplastic radiation therapy: Secondary | ICD-10-CM | POA: Insufficient documentation

## 2015-04-03 DIAGNOSIS — I10 Essential (primary) hypertension: Secondary | ICD-10-CM | POA: Insufficient documentation

## 2015-04-03 DIAGNOSIS — J449 Chronic obstructive pulmonary disease, unspecified: Secondary | ICD-10-CM | POA: Diagnosis not present

## 2015-04-03 DIAGNOSIS — N529 Male erectile dysfunction, unspecified: Secondary | ICD-10-CM | POA: Insufficient documentation

## 2015-04-03 DIAGNOSIS — E7800 Pure hypercholesterolemia, unspecified: Secondary | ICD-10-CM | POA: Insufficient documentation

## 2015-04-03 DIAGNOSIS — R911 Solitary pulmonary nodule: Secondary | ICD-10-CM | POA: Insufficient documentation

## 2015-04-03 DIAGNOSIS — E109 Type 1 diabetes mellitus without complications: Secondary | ICD-10-CM | POA: Insufficient documentation

## 2015-04-03 DIAGNOSIS — C160 Malignant neoplasm of cardia: Secondary | ICD-10-CM

## 2015-04-03 DIAGNOSIS — F329 Major depressive disorder, single episode, unspecified: Secondary | ICD-10-CM | POA: Insufficient documentation

## 2015-04-03 DIAGNOSIS — D649 Anemia, unspecified: Secondary | ICD-10-CM | POA: Insufficient documentation

## 2015-04-03 DIAGNOSIS — K219 Gastro-esophageal reflux disease without esophagitis: Secondary | ICD-10-CM | POA: Insufficient documentation

## 2015-04-03 NOTE — Progress Notes (Signed)
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   HISTORY OF PRESENT ILLNESS::Ronald Johnson is a 69 y.o. male who is seen for an initial consultation visit. He presented with some lower left chest pain in March. Subsequent imaging revealed carcinoma in the gastroesophageal junction. He reports the pain is a 4/10. He denies pain or difficulty while swallowing.  He is able to eat solid foods. Denies nausea. He has already seen Dr. Servando Snare and Dr. [name]. Chemotherapy was recommended. He was referred to me today for the consideration of radiation for the management of his disease.  The patient's initial symptoms prompted workup including upper endoscopy and biopsy. The biopsy returned positive for adenocarcinoma. On endoscopy, the tumor was present at the [place] junction with the majority extending into the gastric cardia. On endoscopic ultrasound, the tumor corresponded to a T3 N1 tumor. No sign of metastatic disease on CT scans.  PREVIOUS RADIATION THERAPY: No   PAST MEDICAL HISTORY:  has a past medical history of COLONIC POLYPS, ADENOMATOUS (08/01/2008); DIABETES MELLITUS, TYPE I (03/13/2007); HYPERCHOLESTEROLEMIA (02/03/2008); ANEMIA (08/15/2009); Anxiety state, unspecified (12/15/2008); DEPRESSION (03/13/2007); HYPERTENSION (03/13/2007); PULMONARY NODULE (11/24/2008); GERD (03/13/2007); TOBACCO ABUSE (11/24/2008); EMPHYSEMA (11/08/2008); Prostatism; ED (erectile dysfunction); [name]'s esophagus; Hiatal hernia; Diverticulitis; Arthritis; and COPD (chronic obstructive pulmonary disease).     PAST SURGICAL HISTORY: Past Surgical History  Procedure Laterality Date  . Egd  11/19/2005  . Foot fracture surgery Right 1980    right foot w/ pins and screws    . Removal pins and screws foot  1980    right foot  . Upper gastrointestinal endoscopy    . Circumcision    . Colonoscopy    . Polypectomy    . Vasectomy    . Eus N/A 03/16/2015    Procedure: UPPER ENDOSCOPIC ULTRASOUND (EUS) RADIAL;  Surgeon: Milus Banister, MD;  Location: [facility];  Service: Endoscopy;  Laterality: N/A;     FAMILY HISTORY: family history includes Breast cancer in his paternal grandmother; Diabetes in his maternal grandmother and mother; Stroke in his mother. There is no history of Colon cancer.   SOCIAL HISTORY:  reports that he quit smoking about 6 years ago. His smoking use included Cigarettes. He has a 35 pack-year smoking history. He has never used smokeless tobacco. He reports that he drinks alcohol. He reports that he does not use illicit drugs.   ALLERGIES: Review of patient's allergies indicates no known allergies.   MEDICATIONS:  Current Outpatient Prescriptions  Medication Sig Dispense Refill  . atorvastatin (LIPITOR) 10 MG tablet TAKE ONE TABLET BY MOUTH EVERY DAY 180 tablet 1  . diazepam (VALIUM) 5 MG tablet Take 1 tablet (5 mg total) by mouth 2 (two) times daily as needed. 180 tablet 1  . HYDROcodone-acetaminophen (NORCO/VICODIN) 5-325 MG per tablet Take 1 tablet by mouth every 6 (six) hours as needed for moderate pain. 60 tablet 0  . insulin NPH Human (NOVOLIN N RELION) 100 UNIT/ML injection Inject 3.3 mLs (330 Units total) into the skin every morning. And syringes 1/day 330 mL 3  . Insulin Syringe-Needle U-100 30G X 3/8" 1 ML MISC Use to inject insulin 3 times per day. 300 each 2  . losartan-hydrochlorothiazide (HYZAAR) 100-12.5 MG per  tablet Take 1 tablet by mouth  daily 90 tablet 2  . mirtazapine (REMERON) 15 MG tablet Take 1 tablet (15 mg total) by mouth at bedtime. 30 tablet 1  . omeprazole (PRILOSEC) 40 MG capsule Take 1 capsule (40 mg total) by mouth daily. 30 capsule 3  . sildenafil (REVATIO) 20 MG tablet TAKE 1-5 TABLETS BY MOUTH ONCE  DAILY AS NEEDED 50 tablet 1  . dexamethasone (DECADRON) 2 MG tablet Take 5 tablets (10 mg total) by mouth as directed. Take #5 tablets night before 1st chemo and morning of 1st chemo (Patient not taking: Reported on 04/03/2015) 10 tablet 0  . glucose blood (PRECISION XTRA TEST STRIPS) test strip Check blood sugar three times a day dx 250.01 270 each 3  . prochlorperazine (COMPAZINE) 10 MG tablet Take 1 tablet (10 mg total) by mouth every 6 (six) hours as needed for nausea. (Patient not taking: Reported on 04/03/2015) 60 tablet 1  . [DISCONTINUED] metoprolol succinate (TOPROL-XL) 25 MG 24 hr tablet Take 25 mg by mouth daily.      No current facility-administered medications for this encounter.     REVIEW OF SYSTEMS:  A 15 point review of systems is documented in the electronic medical record. This was obtained by the nursing staff. However, I reviewed this with the patient to discuss relevant findings and make appropriate changes.  Pertinent items are noted in HPI.    PHYSICAL EXAM:  height is 6\' 4"  (1.93 m) and weight is 265 lb 14.4 oz (120.611 kg). His oral temperature is 97.6 F (36.4 C). His blood pressure is 117/60 and his pulse is 91. His respiration is 20.   General: Alert and oriented, in no acute distress HEENT: Head is normocephalic. Extraocular movements are intact. Heart: Regular in rate and rhythm with no murmurs, rubs, or gallops. Chest: Clear to auscultation bilaterally, with no rhonchi, wheezes, or rales. Abdomen: Soft, nontender, nondistended, with no rigidity or guarding. Extremities: No cyanosis or edema. Skin: No concerning lesions. Musculoskeletal: symmetric strength and muscle tone throughout. Neurologic: Cranial nerves II through XII are grossly intact. No obvious focalities. Speech is fluent. Coordination is intact. Psychiatric: Judgment and insight are intact. Affect is appropriate.  ECOG = 1  0 - Asymptomatic (Fully active, able to carry on all predisease activities  without restriction)  1 - Symptomatic but completely ambulatory (Restricted in physically strenuous activity but ambulatory and able to carry out work of a light or sedentary nature. For example, light housework, office work)  2 - Symptomatic, <50% in bed during the day (Ambulatory and capable of all self care but unable to carry out any work activities. Up and about more than 50% of waking hours)  3 - Symptomatic, >50% in bed, but not bedbound (Capable of only limited self-care, confined to bed or chair 50% or more of waking hours)  4 - Bedbound (Completely disabled. Cannot carry on any self-care. Totally confined to bed or chair)  5 - Death   Eustace Pen MM, Creech RH, Tormey DC, et al. [phone]). "Toxicity and response criteria of the [facility]". Stella Oncol. 5 (6): 649-55   LABORATORY DATA:  Lab Results  Component Value Date   WBC 7.4 01/31/2015   HGB 12.4* 01/31/2015   HCT 36.4* 01/31/2015   MCV 91.9 01/31/2015   PLT 198.0 01/31/2015   Lab Results  Component Value Date   NA 134* 01/31/2015   K 4.4 01/31/2015   CL 103 01/31/2015   CO2  24 01/31/2015   Lab Results  Component Value Date   ALT 18 01/31/2015   AST 18 01/31/2015   ALKPHOS 63 01/31/2015   BILITOT 0.3 01/31/2015      RADIOGRAPHY: Ct Chest W Contrast  03/27/2015   CLINICAL DATA:  Subsequent encounter for adenocarcinoma of the GE junction.  EXAM: CT CHEST WITH CONTRAST  TECHNIQUE: Multidetector CT imaging of the chest was performed during intravenous contrast administration.  CONTRAST:  17mL OMNIPAQUE IOHEXOL 300 MG/ML  SOLN  COMPARISON:  11/02/2013.  05/14/2012.  FINDINGS: Mediastinum / Lymph Nodes: There is no axillary lymphadenopathy. No mediastinal lymphadenopathy. There is no hilar lymphadenopathy. Heart size is normal. Coronary artery calcification is noted. Circumferential wall thickening is noted in the distal esophagus.  Lungs / Pleura: Several scattered pulmonary nodules are evident.  2 mm posterior right upper lobe nodule is seen on image 23 series 3. 7 mm right lower lobe nodule is seen on image 38. Peripheral left lower lobe 5 mm nodule is seen on image 57. All 3 of these nodules were present on the previous 2 studies and are unchanged in the interval.  No focal airspace consolidation. No pulmonary edema or pleural effusion. Underlying changes of emphysema again noted.  MSK / Soft Tissues: Bone windows reveal no worrisome lytic or sclerotic osseous lesions.  Upper Abdomen: No change in the left adrenal nodule, previously characterized as benign adrenal adenoma. Right upper pole renal cyst also unchanged since 05/14/2012.  IMPRESSION: 1. Mild circumferential wall thickening in the distal esophagus in this patient with adenocarcinoma of the GE junction. 2. Several small pulmonary nodules are unchanged over 3 years, consistent with benign disease such as scarring. No new pulmonary nodules to suggest pulmonary metastases. 3. Stable left adrenal nodule seen previously to represent benign adrenal adenoma.   Electronically Signed   By: Misty Stanley M.D.   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 He is a good candidate for chemoradiation with possible surgery afterwards.  PLAN: I expressed the process of CT simulation and the placement of tattoos. We discussed the possibilities of side effects that could occur and are not limited to; nausea, heartburn, difficulty swallowing, mild fatigue, and diarrhea. I would like the pt to undergo a PET scan either later this week or early next week. We anticipate 5 1/2 weeks of radiotherapy that is concurrent with chemotherapy. He is scheduled to begin his first cycle of chemotherapy on 04/20/15. The pt has questions regarding surgery and he will discuss this with Dr. Servando Snare. The pt signed a consent form and this was placed in his medical chart.  I spent 45 minutes face to face with the patient and more than 50% of  that time was spent in counseling and/or coordination of care.   This document serves as a record of services personally performed by Kyung Rudd, MD. It was created on his behalf by Darcus Austin, a trained medical scribe. The creation of this record is based on the scribe's personal observations and the provider's statements to them. This document has been checked and approved by the attending provider.     ________________________________   Jodelle Gross, MD, PhD

## 2015-04-03 NOTE — Progress Notes (Addendum)
Location of Tumor / Histology: GE JUNCTION ESOPHAGUS  Ronald Johnson presented  months ago with symptoms of: Mild difficulty swallowing  Epigastric discomfort when eating  Biopsies of  (if applicable) revealed: Diagnosis 03/08/15:  Surgical(P), Distal esophagus- ADENOCARCINOMA, SEE COMMENT.  Past/Anticipated interventions by surgeon, if any:  EUS 03/16/15 Dr. Shela Commons,  Dr. Ivory Broad appt 03/30/15=onsider surgical resection October or early November after Radiation    Past/Anticipated interventions by medical oncology, if [id number] 04/06/15,with  Ernestene Kiel, dietician appt ,04/20/15 1xt Taxol/Carboplatibn infusion, 04/27/15 Dr. [name] appt,  Weight changes, if any: gained 15 lbs  Bowel/Bladder complaints, if any: blood in stool after endoscopy 03/08/15   Nausea / Vomiting, if any: NO  Pain issues, if any: Lower chest mid, last pain 5am, none at present, if eating meats apples, fels food gets stuck bottom of sternum and hurts then  Any blood per rectum:  NO regular bowel movemnets SAFETY ISSUES:  Prior radiation? NO  Pacemaker/ICD? NO  Is the patient on methotrexate? 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

## 2015-04-03 NOTE — Progress Notes (Signed)
Please see the Nurse Progress Note in the MD Initial Consult Encounter for this patient. 

## 2015-04-04 ENCOUNTER — Telehealth: Payer: Self-pay | Admitting: *Deleted

## 2015-04-04 ENCOUNTER — Ambulatory Visit (HOSPITAL_COMMUNITY)
Admission: RE | Admit: 2015-04-04 | Discharge: 2015-04-04 | Disposition: A | Discharge status: Home / Self Care | Payer: Medicare Other | Source: Ambulatory Visit | Attending: Cardiothoracic Surgery | Admitting: Cardiothoracic Surgery

## 2015-04-04 ENCOUNTER — Encounter: Payer: Medicare Other | Admitting: *Deleted

## 2015-04-04 DIAGNOSIS — C160 Malignant neoplasm of cardia: Secondary | ICD-10-CM | POA: Insufficient documentation

## 2015-04-04 DIAGNOSIS — J449 Chronic obstructive pulmonary disease, unspecified: Secondary | ICD-10-CM | POA: Insufficient documentation

## 2015-04-04 LAB — PULMONARY FUNCTION TEST
DL/VA % pred: 62 %
DL/VA: 3.08 ml/min/mmHg/L
DLCO unc % pred: 50 %
DLCO unc: 20.5 ml/min/mmHg
FEF 25-75 Post: 0.74 L/sec
FEF 25-75 Pre: 0.69 L/sec
FEF2575-%Change-Post: 7 %
FEF2575-%Pred-Post: 22 %
FEF2575-%Pred-Pre: 21 %
FEV1-%Change-Post: -1 %
FEV1-%Pred-Post: 53 %
FEV1-%Pred-Pre: 54 %
FEV1-Post: 2.24 L
FEV1-Pre: 2.27 L
FEV1FVC-%Change-Post: -5 %
FEV1FVC-%Pred-Pre: 68 %
FEV6-%Change-Post: 1 %
FEV6-%Pred-Post: 71 %
FEV6-%Pred-Pre: 70 %
FEV6-Post: 3.83 L
FEV6-Pre: 3.77 L
FEV6FVC-%Change-Post: -1 %
FEV6FVC-%Pred-Post: 87 %
FEV6FVC-%Pred-Pre: 88 %
FVC-%Change-Post: 4 %
FVC-%Pred-Post: 83 %
FVC-%Pred-Pre: 79 %
FVC-Post: 4.64 L
FVC-Pre: 4.46 L
Post FEV1/FVC ratio: 48 %
Post FEV6/FVC ratio: 83 %
Pre FEV1/FVC ratio: 51 %
Pre FEV6/FVC Ratio: 84 %
RV % pred: 183 %
RV: 4.99 L
TLC % pred: 112 %
TLC: 9.26 L

## 2015-04-04 MED ORDER — ALBUTEROL SULFATE (2.5 MG/3ML) 0.083% IN NEBU
2.5000 mg | INHALATION_SOLUTION | Freq: Once | RESPIRATORY_TRACT | Status: AC
  Administered 2015-04-04: 2.5 mg via RESPIRATORY_TRACT

## 2015-04-04 NOTE — Progress Notes (Signed)
[place] Psychosocial Distress Screening Clinical Social Work  Clinical Social Work was referred by Art therapist and distress screening protocol.  The patient scored a 10 on the Psychosocial Distress Thermometer which indicates severe distress. Patient did not want to see CSW during his visit and requested CSW call him at home.  Clinical Social Worker contacted patient at home to assess for distress and other psychosocial needs.  CSW left patient a message  offering support and information on support programs at [facility].  CSW requested patient call with questions or concerns.  A member of the CSW team will follow up with patient at his next appointment.      Clinical Social Worker follow up needed: Yes.    If yes, follow up plan: CSW follow up at next appointment    [place] 04/03/2015  Screening Type Initial Screening  Distress experienced in past week (1-10) 10  Emotional problem type Adjusting to illness;Feeling hopeless  Information Concerns Type Lack of info about diagnosis  Physical Problem type Pain;Sleep/insomnia;Constipation/diarrhea  Physician notified of physical symptoms Yes  Referral to clinical social work Yes   Johnnye Lana, MSW, LCSW, OSW-C Clinical Social Worker [facility] [phone]

## 2015-04-04 NOTE — Telephone Encounter (Signed)
Called patient to inform of Pet Scan for 04-12-15, spoke with patient's wife and she is aware of this test

## 2015-04-06 ENCOUNTER — Ambulatory Visit: Payer: Medicare Other

## 2015-04-06 ENCOUNTER — Other Ambulatory Visit: Payer: Medicare Other

## 2015-04-06 ENCOUNTER — Ambulatory Visit: Payer: Medicare Other | Admitting: Radiation Oncology

## 2015-04-06 ENCOUNTER — Encounter: Payer: Medicare Other | Admitting: Nutrition

## 2015-04-07 ENCOUNTER — Encounter: Payer: Self-pay | Admitting: Radiation Oncology

## 2015-04-07 ENCOUNTER — Ambulatory Visit
Admission: RE | Admit: 2015-04-07 | Discharge: 2015-04-07 | Disposition: A | Discharge status: Home / Self Care | Payer: Medicare Other | Source: Ambulatory Visit | Attending: Radiation Oncology | Admitting: Radiation Oncology

## 2015-04-07 DIAGNOSIS — J449 Chronic obstructive pulmonary disease, unspecified: Secondary | ICD-10-CM | POA: Diagnosis not present

## 2015-04-07 DIAGNOSIS — K219 Gastro-esophageal reflux disease without esophagitis: Secondary | ICD-10-CM | POA: Diagnosis not present

## 2015-04-07 DIAGNOSIS — N529 Male erectile dysfunction, unspecified: Secondary | ICD-10-CM | POA: Diagnosis not present

## 2015-04-07 DIAGNOSIS — Z510 Encounter for antineoplastic radiation therapy: Secondary | ICD-10-CM | POA: Diagnosis not present

## 2015-04-07 DIAGNOSIS — C160 Malignant neoplasm of cardia: Secondary | ICD-10-CM

## 2015-04-07 DIAGNOSIS — D649 Anemia, unspecified: Secondary | ICD-10-CM | POA: Diagnosis not present

## 2015-04-10 ENCOUNTER — Other Ambulatory Visit: Payer: Self-pay | Admitting: Endocrinology

## 2015-04-12 ENCOUNTER — Ambulatory Visit (HOSPITAL_COMMUNITY)
Admission: RE | Admit: 2015-04-12 | Discharge: 2015-04-12 | Disposition: A | Discharge status: Home / Self Care | Payer: Medicare Other | Source: Ambulatory Visit | Attending: Radiation Oncology | Admitting: Radiation Oncology

## 2015-04-12 DIAGNOSIS — C160 Malignant neoplasm of cardia: Secondary | ICD-10-CM | POA: Diagnosis not present

## 2015-04-12 DIAGNOSIS — I7090 Unspecified atherosclerosis: Secondary | ICD-10-CM | POA: Insufficient documentation

## 2015-04-12 LAB — GLUCOSE, CAPILLARY: GLUCOSE-CAPILLARY: 251 mg/dL — AB (ref 65–99)

## 2015-04-12 MED ORDER — FLUDEOXYGLUCOSE F - 18 (FDG) INJECTION
13.0800 | Freq: Once | INTRAVENOUS | Status: DC | PRN
  Administered 2015-04-12: 13.08 via INTRAVENOUS
  Filled 2015-04-12: qty 13.08

## 2015-04-14 ENCOUNTER — Encounter: Payer: Self-pay | Admitting: Radiation Oncology

## 2015-04-14 DIAGNOSIS — Z510 Encounter for antineoplastic radiation therapy: Secondary | ICD-10-CM | POA: Diagnosis not present

## 2015-04-14 DIAGNOSIS — N529 Male erectile dysfunction, unspecified: Secondary | ICD-10-CM | POA: Diagnosis not present

## 2015-04-14 DIAGNOSIS — K219 Gastro-esophageal reflux disease without esophagitis: Secondary | ICD-10-CM | POA: Diagnosis not present

## 2015-04-14 DIAGNOSIS — C160 Malignant neoplasm of cardia: Secondary | ICD-10-CM | POA: Diagnosis not present

## 2015-04-14 DIAGNOSIS — D649 Anemia, unspecified: Secondary | ICD-10-CM | POA: Diagnosis not present

## 2015-04-14 DIAGNOSIS — J449 Chronic obstructive pulmonary disease, unspecified: Secondary | ICD-10-CM | POA: Diagnosis not present

## 2015-04-16 ENCOUNTER — Other Ambulatory Visit: Payer: Self-pay | Admitting: Oncology

## 2015-04-17 ENCOUNTER — Other Ambulatory Visit: Payer: Self-pay | Admitting: *Deleted

## 2015-04-17 ENCOUNTER — Ambulatory Visit: Payer: Medicare Other

## 2015-04-17 DIAGNOSIS — C160 Malignant neoplasm of cardia: Secondary | ICD-10-CM

## 2015-04-18 ENCOUNTER — Ambulatory Visit: Payer: Medicare Other

## 2015-04-19 ENCOUNTER — Ambulatory Visit
Admission: RE | Admit: 2015-04-19 | Discharge: 2015-04-19 | Disposition: A | Discharge status: Home / Self Care | Payer: Medicare Other | Source: Ambulatory Visit | Attending: Radiation Oncology | Admitting: Radiation Oncology

## 2015-04-19 ENCOUNTER — Ambulatory Visit: Payer: Medicare Other

## 2015-04-19 DIAGNOSIS — D649 Anemia, unspecified: Secondary | ICD-10-CM | POA: Diagnosis not present

## 2015-04-19 DIAGNOSIS — N529 Male erectile dysfunction, unspecified: Secondary | ICD-10-CM | POA: Diagnosis not present

## 2015-04-19 DIAGNOSIS — Z510 Encounter for antineoplastic radiation therapy: Secondary | ICD-10-CM | POA: Diagnosis not present

## 2015-04-19 DIAGNOSIS — J449 Chronic obstructive pulmonary disease, unspecified: Secondary | ICD-10-CM | POA: Diagnosis not present

## 2015-04-19 DIAGNOSIS — C160 Malignant neoplasm of cardia: Secondary | ICD-10-CM | POA: Diagnosis not present

## 2015-04-19 DIAGNOSIS — K219 Gastro-esophageal reflux disease without esophagitis: Secondary | ICD-10-CM | POA: Diagnosis not present

## 2015-04-20 ENCOUNTER — Other Ambulatory Visit (HOSPITAL_BASED_OUTPATIENT_CLINIC_OR_DEPARTMENT_OTHER): Payer: Medicare Other

## 2015-04-20 ENCOUNTER — Ambulatory Visit: Payer: Medicare Other | Admitting: Nutrition

## 2015-04-20 ENCOUNTER — Other Ambulatory Visit: Payer: Self-pay | Admitting: *Deleted

## 2015-04-20 ENCOUNTER — Ambulatory Visit (HOSPITAL_BASED_OUTPATIENT_CLINIC_OR_DEPARTMENT_OTHER): Payer: Medicare Other

## 2015-04-20 ENCOUNTER — Ambulatory Visit
Admission: RE | Admit: 2015-04-20 | Discharge: 2015-04-20 | Disposition: A | Discharge status: Home / Self Care | Payer: Medicare Other | Source: Ambulatory Visit | Attending: Radiation Oncology | Admitting: Radiation Oncology

## 2015-04-20 ENCOUNTER — Other Ambulatory Visit: Payer: Self-pay | Admitting: Oncology

## 2015-04-20 VITALS — BP 134/64 | HR 58 | Temp 97.7°F | Resp 18

## 2015-04-20 DIAGNOSIS — J449 Chronic obstructive pulmonary disease, unspecified: Secondary | ICD-10-CM | POA: Diagnosis not present

## 2015-04-20 DIAGNOSIS — E119 Type 2 diabetes mellitus without complications: Secondary | ICD-10-CM

## 2015-04-20 DIAGNOSIS — E7800 Pure hypercholesterolemia, unspecified: Secondary | ICD-10-CM | POA: Diagnosis not present

## 2015-04-20 DIAGNOSIS — Z5111 Encounter for antineoplastic chemotherapy: Secondary | ICD-10-CM | POA: Diagnosis not present

## 2015-04-20 DIAGNOSIS — D649 Anemia, unspecified: Secondary | ICD-10-CM | POA: Diagnosis not present

## 2015-04-20 DIAGNOSIS — C160 Malignant neoplasm of cardia: Secondary | ICD-10-CM

## 2015-04-20 DIAGNOSIS — N529 Male erectile dysfunction, unspecified: Secondary | ICD-10-CM | POA: Diagnosis not present

## 2015-04-20 DIAGNOSIS — K219 Gastro-esophageal reflux disease without esophagitis: Secondary | ICD-10-CM | POA: Diagnosis not present

## 2015-04-20 DIAGNOSIS — Z510 Encounter for antineoplastic radiation therapy: Secondary | ICD-10-CM | POA: Diagnosis not present

## 2015-04-20 DIAGNOSIS — I10 Essential (primary) hypertension: Secondary | ICD-10-CM | POA: Diagnosis not present

## 2015-04-20 DIAGNOSIS — R911 Solitary pulmonary nodule: Secondary | ICD-10-CM | POA: Diagnosis not present

## 2015-04-20 DIAGNOSIS — E109 Type 1 diabetes mellitus without complications: Secondary | ICD-10-CM | POA: Diagnosis not present

## 2015-04-20 DIAGNOSIS — F329 Major depressive disorder, single episode, unspecified: Secondary | ICD-10-CM | POA: Diagnosis not present

## 2015-04-20 LAB — WHOLE BLOOD GLUCOSE
Glucose: 507 mg/dL — ABNORMAL HIGH (ref 70–100)
HRS PC: 0.25 Hours

## 2015-04-20 LAB — COMPREHENSIVE METABOLIC PANEL (CC13)
ALBUMIN: 3.7 g/dL (ref 3.5–5.0)
ALT: 19 U/L (ref 0–55)
AST: 17 U/L (ref 5–34)
Alkaline Phosphatase: 84 U/L (ref 40–150)
Anion Gap: 10 mEq/L (ref 3–11)
BUN: 27.2 mg/dL — AB (ref 7.0–26.0)
CHLORIDE: 103 meq/L (ref 98–109)
CO2: 23 meq/L (ref 22–29)
Calcium: 9.6 mg/dL (ref 8.4–10.4)
Creatinine: 1.6 mg/dL — ABNORMAL HIGH (ref 0.7–1.3)
EGFR: 45 mL/min/{1.73_m2} — AB (ref >90)
GLUCOSE: 441 mg/dL — AB (ref 70–140)
POTASSIUM: 5.1 meq/L (ref 3.5–5.1)
SODIUM: 136 meq/L (ref 136–145)
Total Bilirubin: 0.36 mg/dL (ref 0.20–1.20)
Total Protein: 6.9 g/dL (ref 6.4–8.3)

## 2015-04-20 LAB — CBC WITH DIFFERENTIAL/PLATELET
BASO%: 0 % (ref 0.0–2.0)
BASOS ABS: 0 10*3/uL (ref 0.0–0.1)
EOS ABS: 0 10*3/uL (ref 0.0–0.5)
EOS%: 0 % (ref 0.0–7.0)
HEMATOCRIT: 36.3 % — AB (ref 38.4–49.9)
HGB: 12.4 g/dL — ABNORMAL LOW (ref 13.0–17.1)
LYMPH#: 0.7 10*3/uL — AB (ref 0.9–3.3)
LYMPH%: 6.7 % — AB (ref 14.0–49.0)
MCH: 31.1 pg (ref 27.2–33.4)
MCHC: 34.2 g/dL (ref 32.0–36.0)
MCV: 91 fL (ref 79.3–98.0)
MONO#: 0.1 10*3/uL (ref 0.1–0.9)
MONO%: 0.8 % (ref 0.0–14.0)
NEUT#: 9.7 10*3/uL — ABNORMAL HIGH (ref 1.5–6.5)
NEUT%: 92.5 % — AB (ref 39.0–75.0)
PLATELETS: 162 10*3/uL (ref 140–400)
RBC: 3.99 10*6/uL — AB (ref 4.20–5.82)
RDW: 12.4 % (ref 11.0–14.6)
WBC: 10.5 10*3/uL — ABNORMAL HIGH (ref 4.0–10.3)

## 2015-04-20 MED ORDER — INSULIN REGULAR HUMAN 100 UNIT/ML IJ SOLN
20.0000 [IU] | Freq: Once | INTRAMUSCULAR | Status: AC
Start: 2015-04-20 — End: 2015-04-20
  Administered 2015-04-20: 20 [IU] via SUBCUTANEOUS
  Filled 2015-04-20: qty 0.2

## 2015-04-20 MED ORDER — SODIUM CHLORIDE 0.9 % IV SOLN
Freq: Once | INTRAVENOUS | Status: AC
  Administered 2015-04-20: 12:00:00 via INTRAVENOUS

## 2015-04-20 MED ORDER — DIPHENHYDRAMINE HCL 50 MG/ML IJ SOLN
25.0000 mg | Freq: Once | INTRAMUSCULAR | Status: AC
  Administered 2015-04-20: 25 mg via INTRAVENOUS

## 2015-04-20 MED ORDER — FAMOTIDINE IN NACL 20-0.9 MG/50ML-% IV SOLN
INTRAVENOUS | Status: AC
  Filled 2015-04-20: qty 50

## 2015-04-20 MED ORDER — DIPHENHYDRAMINE HCL 50 MG/ML IJ SOLN
INTRAMUSCULAR | Status: AC
  Filled 2015-04-20: qty 1

## 2015-04-20 MED ORDER — SODIUM CHLORIDE 0.9 % IV SOLN
202.2000 mg | Freq: Once | INTRAVENOUS | Status: AC
  Administered 2015-04-20: 200 mg via INTRAVENOUS
  Filled 2015-04-20: qty 20

## 2015-04-20 MED ORDER — FAMOTIDINE IN NACL 20-0.9 MG/50ML-% IV SOLN
20.0000 mg | Freq: Once | INTRAVENOUS | Status: AC
  Administered 2015-04-20: 20 mg via INTRAVENOUS

## 2015-04-20 MED ORDER — SODIUM CHLORIDE 0.9 % IV SOLN
50.0000 mg/m2 | Freq: Once | INTRAVENOUS | Status: AC
  Administered 2015-04-20: 126 mg via INTRAVENOUS
  Filled 2015-04-20: qty 21

## 2015-04-20 MED ORDER — SODIUM CHLORIDE 0.9 % IV SOLN
Freq: Once | INTRAVENOUS | Status: AC
  Administered 2015-04-20: 14:00:00 via INTRAVENOUS
  Filled 2015-04-20: qty 8

## 2015-04-20 NOTE — Progress Notes (Signed)
69 year old male diagnosed with esophageal cancer.  He is a patient of Dr. Julieanne Manson.  Past medical history includes type 1 diabetes, hypercholesterolemia, anemia, anxiety, depression, hypertension, GERD, tobacco, [name]'s esophagus, hiatal hernia, and COPD.  Medications include Lipitor, Decadron, Remeron, Prilosec, and Compazine.  Labs include glucose 214 on July 28 noted, BUN of 27 and creatinine 1.26.  Height: 6 feet 4 inches. Weight: 265.9 pounds August 15. Usual body weight: Patient states usual body weight is 210 pounds.  Patient weight 258 pounds in April 2016. BMI: 32.38.  Currently patient is eating normally. He has no difficulty chewing or swallowing and no other nutrition impact symptoms.  Nutrition diagnosis:  Predicted suboptimal energy intake related to esophageal cancer and associated treatments as evidenced by the history or presence of a condition for which research shows an increased incidence of suboptimal energy intake.  Intervention:  Patient educated to consume small frequent meals and snacks with adequate calories and protein to minimize loss of lean body mass. Enforced importance of following a consistent carbohydrate controlled diet. Encouraged patient to be in contact with his endocrinologist for any assistance with hyperglycemia. Educated patient on high-protein foods and provided a fact sheet. Also provided recipes for shakes and smoothies. Questions were answered.  Teach back method was used.  Patient was provided with contact information.  Monitoring, evaluation, goals: Patient will tolerate adequate calories and protein to minimize loss of lean body mass  Next visit: Thursday, September 29, during infusion.  **Disclaimer: This note was dictated with voice recognition software. Similar sounding words can inadvertently be transcribed and this note may contain transcription errors which may not have been corrected upon publication of note.**

## 2015-04-20 NOTE — Progress Notes (Signed)
Pt tolerated 1st time dose of Taxol and Carboplatin well. Patient's CBG was 507 at 1537.  Novalin R 20 units administered per order from Dr. [name]. Patient has sliding scale insulin coverage at home and stated that he felt comfortable monitoring his CBG when he arrives home.

## 2015-04-20 NOTE — Progress Notes (Signed)
Dr. [name] informed of blood sugar 507. Order received for Regular insulin 20 units now. Repeat blood glucose in 1 hour. Order called to Particia Lather RN. Pt reports he has both long and short acting insulin at home.

## 2015-04-20 NOTE — Patient Instructions (Signed)
Carboplatin injection  What is this medicine?  CARBOPLATIN (KAR boe pla tin) is a chemotherapy drug. It targets fast dividing cells, like cancer cells, and causes these cells to die. This medicine is used to treat ovarian cancer and many other cancers.  This medicine may be used for other purposes; ask your health care provider or pharmacist if you have questions.  COMMON BRAND NAME(S): Paraplatin  What should I tell my health care provider before I take this medicine?  They need to know if you have any of these conditions:  -blood disorders  -hearing problems  -kidney disease  -recent or ongoing radiation therapy  -an unusual or allergic reaction to carboplatin, cisplatin, other chemotherapy, other medicines, foods, dyes, or preservatives  -pregnant or trying to get pregnant  -breast-feeding  How should I use this medicine?  This drug is usually given as an infusion into a vein. It is administered in a hospital or clinic by a specially trained health care professional.  Talk to your pediatrician regarding the use of this medicine in children. Special care may be needed.  Overdosage: If you think you have taken too much of this medicine contact a poison control center or emergency room at once.  NOTE: This medicine is only for you. Do not share this medicine with others.  What if I miss a dose?  It is important not to miss a dose. Call your doctor or health care professional if you are unable to keep an appointment.  What may interact with this medicine?  -medicines for seizures  -medicines to increase blood counts like filgrastim, pegfilgrastim, sargramostim  -some antibiotics like amikacin, gentamicin, neomycin, streptomycin, tobramycin  -vaccines  Talk to your doctor or health care professional before taking any of these medicines:  -acetaminophen  -aspirin  -ibuprofen  -ketoprofen  -naproxen  This list may not describe all possible interactions. Give your health care provider a list of all the medicines, herbs,  non-prescription drugs, or dietary supplements you use. Also tell them if you smoke, drink alcohol, or use illegal drugs. Some items may interact with your medicine.  What should I watch for while using this medicine?  Your condition will be monitored carefully while you are receiving this medicine. You will need important blood work done while you are taking this medicine.  This drug may make you feel generally unwell. This is not uncommon, as chemotherapy can affect healthy cells as well as cancer cells. Report any side effects. Continue your course of treatment even though you feel ill unless your doctor tells you to stop.  In some cases, you may be given additional medicines to help with side effects. Follow all directions for their use.  Call your doctor or health care professional for advice if you get a fever, chills or sore throat, or other symptoms of a cold or flu. Do not treat yourself. This drug decreases your body's ability to fight infections. Try to avoid being around people who are sick.  This medicine may increase your risk to bruise or bleed. Call your doctor or health care professional if you notice any unusual bleeding.  Be careful brushing and flossing your teeth or using a toothpick because you may get an infection or bleed more easily. If you have any dental work done, tell your dentist you are receiving this medicine.  Avoid taking products that contain aspirin, acetaminophen, ibuprofen, naproxen, or ketoprofen unless instructed by your doctor. These medicines may hide a fever.  Do not become   pregnant while taking this medicine. Women should inform their doctor if they wish to become pregnant or think they might be pregnant. There is a potential for serious side effects to an unborn child. Talk to your health care professional or pharmacist for more information. Do not breast-feed an infant while taking this medicine.  What side effects may I notice from receiving this medicine?  Side effects  that you should report to your doctor or health care professional as soon as possible:  -allergic reactions like skin rash, itching or hives, swelling of the face, lips, or tongue  -signs of infection - fever or chills, cough, sore throat, pain or difficulty passing urine  -signs of decreased platelets or bleeding - bruising, pinpoint red spots on the skin, black, tarry stools, nosebleeds  -signs of decreased red blood cells - unusually weak or tired, fainting spells, lightheadedness  -breathing problems  -changes in hearing  -changes in vision  -chest pain  -high blood pressure  -low blood counts - This drug may decrease the number of white blood cells, red blood cells and platelets. You may be at increased risk for infections and bleeding.  -nausea and vomiting  -pain, swelling, redness or irritation at the injection site  -pain, tingling, numbness in the hands or feet  -problems with balance, talking, walking  -trouble passing urine or change in the amount of urine  Side effects that usually do not require medical attention (report to your doctor or health care professional if they continue or are bothersome):  -hair loss  -loss of appetite  -metallic taste in the mouth or changes in taste  This list may not describe all possible side effects. Call your doctor for medical advice about side effects. You may report side effects to FDA at 1-800-FDA-1088.  Where should I keep my medicine?  This drug is given in a hospital or clinic and will not be stored at home.  NOTE: This sheet is a summary. It may not cover all possible information. If you have questions about this medicine, talk to your doctor, pharmacist, or health care provider.   2015, Elsevier/Gold Standard. (2007-11-10 14:38:05)  Paclitaxel injection  What is this medicine?  PACLITAXEL (PAK li TAX el) is a chemotherapy drug. It targets fast dividing cells, like cancer cells, and causes these cells to die. This medicine is used to treat ovarian cancer,  breast cancer, and other cancers.  This medicine may be used for other purposes; ask your health care provider or pharmacist if you have questions.  COMMON BRAND NAME(S): Onxol, Taxol  What should I tell my health care provider before I take this medicine?  They need to know if you have any of these conditions:  -blood disorders  -irregular heartbeat  -infection (especially a virus infection such as chickenpox, cold sores, or herpes)  -liver disease  -previous or ongoing radiation therapy  -an unusual or allergic reaction to paclitaxel, alcohol, polyoxyethylated castor oil, other chemotherapy agents, other medicines, foods, dyes, or preservatives  -pregnant or trying to get pregnant  -breast-feeding  How should I use this medicine?  This drug is given as an infusion into a vein. It is administered in a hospital or clinic by a specially trained health care professional.  Talk to your pediatrician regarding the use of this medicine in children. Special care may be needed.  Overdosage: If you think you have taken too much of this medicine contact a poison control center or emergency room at once.  NOTE:   This medicine is only for you. Do not share this medicine with others.  What if I miss a dose?  It is important not to miss your dose. Call your doctor or health care professional if you are unable to keep an appointment.  What may interact with this medicine?  Do not take this medicine with any of the following medications:  -disulfiram  -metronidazole  This medicine may also interact with the following medications:  -cyclosporine  -diazepam  -ketoconazole  -medicines to increase blood counts like filgrastim, pegfilgrastim, sargramostim  -other chemotherapy drugs like cisplatin, doxorubicin, epirubicin, etoposide, teniposide, vincristine  -quinidine  -testosterone  -vaccines  -verapamil  Talk to your doctor or health care professional before taking any of these  medicines:  -acetaminophen  -aspirin  -ibuprofen  -ketoprofen  -naproxen  This list may not describe all possible interactions. Give your health care provider a list of all the medicines, herbs, non-prescription drugs, or dietary supplements you use. Also tell them if you smoke, drink alcohol, or use illegal drugs. Some items may interact with your medicine.  What should I watch for while using this medicine?  Your condition will be monitored carefully while you are receiving this medicine. You will need important blood work done while you are taking this medicine.  This drug may make you feel generally unwell. This is not uncommon, as chemotherapy can affect healthy cells as well as cancer cells. Report any side effects. Continue your course of treatment even though you feel ill unless your doctor tells you to stop.  In some cases, you may be given additional medicines to help with side effects. Follow all directions for their use.  Call your doctor or health care professional for advice if you get a fever, chills or sore throat, or other symptoms of a cold or flu. Do not treat yourself. This drug decreases your body's ability to fight infections. Try to avoid being around people who are sick.  This medicine may increase your risk to bruise or bleed. Call your doctor or health care professional if you notice any unusual bleeding.  Be careful brushing and flossing your teeth or using a toothpick because you may get an infection or bleed more easily. If you have any dental work done, tell your dentist you are receiving this medicine.  Avoid taking products that contain aspirin, acetaminophen, ibuprofen, naproxen, or ketoprofen unless instructed by your doctor. These medicines may hide a fever.  Do not become pregnant while taking this medicine. Women should inform their doctor if they wish to become pregnant or think they might be pregnant. There is a potential for serious side effects to an unborn child. Talk to  your health care professional or pharmacist for more information. Do not breast-feed an infant while taking this medicine.  Men are advised not to father a child while receiving this medicine.  What side effects may I notice from receiving this medicine?  Side effects that you should report to your doctor or health care professional as soon as possible:  -allergic reactions like skin rash, itching or hives, swelling of the face, lips, or tongue  -low blood counts - This drug may decrease the number of white blood cells, red blood cells and platelets. You may be at increased risk for infections and bleeding.  -signs of infection - fever or chills, cough, sore throat, pain or difficulty passing urine  -signs of decreased platelets or bleeding - bruising, pinpoint red spots on the skin, black, tarry   stools, nosebleeds  -signs of decreased red blood cells - unusually weak or tired, fainting spells, lightheadedness  -breathing problems  -chest pain  -high or low blood pressure  -mouth sores  -nausea and vomiting  -pain, swelling, redness or irritation at the injection site  -pain, tingling, numbness in the hands or feet  -slow or irregular heartbeat  -swelling of the ankle, feet, hands  Side effects that usually do not require medical attention (report to your doctor or health care professional if they continue or are bothersome):  -bone pain  -complete hair loss including hair on your head, underarms, pubic hair, eyebrows, and eyelashes  -changes in the color of fingernails  -diarrhea  -loosening of the fingernails  -loss of appetite  -muscle or joint pain  -red flush to skin  -sweating  This list may not describe all possible side effects. Call your doctor for medical advice about side effects. You may report side effects to FDA at 1-800-FDA-1088.  Where should I keep my medicine?  This drug is given in a hospital or clinic and will not be stored at home.  NOTE: This sheet is a summary. It may not cover all possible  information. If you have questions about this medicine, talk to your doctor, pharmacist, or health care provider.   2015, Elsevier/Gold Standard. (2012-09-28 16:41:21)

## 2015-04-21 ENCOUNTER — Ambulatory Visit
Admission: RE | Admit: 2015-04-21 | Discharge: 2015-04-21 | Disposition: A | Discharge status: Home / Self Care | Payer: Medicare Other | Source: Ambulatory Visit | Attending: Radiation Oncology | Admitting: Radiation Oncology

## 2015-04-21 ENCOUNTER — Encounter: Payer: Self-pay | Admitting: Radiation Oncology

## 2015-04-21 VITALS — BP 115/57 | HR 65 | Temp 97.3°F | Resp 20 | Wt 267.7 lb

## 2015-04-21 DIAGNOSIS — D649 Anemia, unspecified: Secondary | ICD-10-CM | POA: Diagnosis not present

## 2015-04-21 DIAGNOSIS — C160 Malignant neoplasm of cardia: Secondary | ICD-10-CM | POA: Diagnosis not present

## 2015-04-21 DIAGNOSIS — K219 Gastro-esophageal reflux disease without esophagitis: Secondary | ICD-10-CM | POA: Diagnosis not present

## 2015-04-21 DIAGNOSIS — Z510 Encounter for antineoplastic radiation therapy: Secondary | ICD-10-CM | POA: Diagnosis not present

## 2015-04-21 DIAGNOSIS — J449 Chronic obstructive pulmonary disease, unspecified: Secondary | ICD-10-CM | POA: Diagnosis not present

## 2015-04-21 DIAGNOSIS — N529 Male erectile dysfunction, unspecified: Secondary | ICD-10-CM | POA: Diagnosis not present

## 2015-04-21 NOTE — Progress Notes (Signed)
Department of Radiation Oncology  Phone:  [phone] Fax:        [phone]  Weekly Treatment Note    Name: Ronald Johnson Date: 04/24/2015 MRN: [id number] DOB: 04-30-1946   Current dose: 5.4 Gy  Current fraction: 3   MEDICATIONS: Current Outpatient Prescriptions  Medication Sig Dispense Refill  . atorvastatin (LIPITOR) 10 MG tablet Take 1 tablet by mouth  every day 90 tablet 1  . diazepam (VALIUM) 5 MG tablet Take 1 tablet (5 mg total) by mouth 2 (two) times daily as needed. 180 tablet 1  . glucose blood (PRECISION XTRA TEST STRIPS) test strip Check blood sugar three times a day dx 250.01 270 each 3  . hyaluronate sodium (RADIAPLEXRX) GEL Apply 1 application topically daily.    Marland Kitchen HYDROcodone-acetaminophen (NORCO/VICODIN) 5-325 MG per tablet Take 1 tablet by mouth every 6 (six) hours as needed for moderate pain. 60 tablet 0  . insulin NPH Human (NOVOLIN N RELION) 100 UNIT/ML injection Inject 3.3 mLs (330 Units total) into the skin every morning. And syringes 1/day 330 mL 3  . Insulin Syringe-Needle U-100 30G X 3/8" 1 ML MISC Use to inject insulin 3 times per day. 300 each 2  . losartan-hydrochlorothiazide (HYZAAR) 100-12.5 MG per tablet Take 1 tablet by mouth  daily 90 tablet 2  . mirtazapine (REMERON) 15 MG tablet Take 1 tablet (15 mg total) by mouth at bedtime. 30 tablet 1  . omeprazole (PRILOSEC) 40 MG capsule Take 1 capsule (40 mg total) by mouth daily. 30 capsule 3  . sildenafil (REVATIO) 20 MG tablet TAKE 1-5 TABLETS BY MOUTH ONCE DAILY AS NEEDED 50 tablet 1  . dexamethasone (DECADRON) 2 MG tablet Take 5 tablets (10 mg total) by mouth as directed. Take #5 tablets night before 1st chemo and morning of 1st chemo (Patient not taking: Reported on 04/03/2015) 10 tablet 0  . prochlorperazine (COMPAZINE) 10 MG tablet Take 1 tablet (10 mg total) by mouth every 6 (six) hours as needed for nausea. (Patient not taking: Reported on 04/03/2015) 60 tablet 1  . [DISCONTINUED] metoprolol  succinate (TOPROL-XL) 25 MG 24 hr tablet Take 25 mg by mouth daily.      No current facility-administered medications for this encounter.     ALLERGIES: Review of patient's allergies indicates no known allergies.   LABORATORY DATA:  Lab Results  Component Value Date   WBC 10.5* 04/20/2015   HGB 12.4* 04/20/2015   HCT 36.3* 04/20/2015   MCV 91.0 04/20/2015   PLT 162 04/20/2015   Lab Results  Component Value Date   NA 136 04/20/2015   K 5.1 04/20/2015   CL 103 01/31/2015   CO2 23 04/20/2015   Lab Results  Component Value Date   ALT 19 04/20/2015   AST 17 04/20/2015   ALKPHOS 84 04/20/2015   BILITOT 0.36 04/20/2015     NARRATIVE: [name] was seen today for weekly treatment management. The chart was checked and the patient's films were reviewed.  Weekly rad txs abdomen 3/28 completed. Denies nausea and diarrhea. Reports slight gas in abdomen, but is otherwise doing well. Pt received radiation therapy education and a "Radiation Therapy and You" book. Radiaplex gel was also given. He had labs yesterday and stopped decadron on Thursday. He reports his blood sugar was 507 and today it's 220.  PHYSICAL EXAMINATION: weight is 267 lb 11.2 oz (121.428 kg). His oral temperature is 97.3 F (36.3 C). His blood pressure is 115/57 and his pulse is  65. His respiration is 20.  Alert and oriented x 3. In no distressed.  ASSESSMENT: The patient is doing satisfactorily with treatment.  PLAN: We will continue with the patient's radiation treatment as planned.  This document serves as a record of services personally performed by Kyung Rudd, MD. It was created on his behalf by Darcus Austin, a trained medical scribe. The creation of this record is based on the scribe's personal observations and the provider's statements to them. This document has been checked and approved by the attending provider.

## 2015-04-21 NOTE — Progress Notes (Signed)
Weekly rad txs 3/28 abdomen, completed, no nausea, no diarrhea, slight gas in abdomen, pt education done radiation therapy and you book, radiaplex gel given ,discussed side effects, nausea, ,vomiting, diarrhea , fatigue,  Skin irritation, gas, bladder changes, low fiber diet if diarrhea, 5-6 smaller meals instead of 3 large ones, drink more water,increase protein in diet, gave my business card as well,  Teach back given , labs yesterday, stopped decadron Thursday, his blood sugar was 507, today 220 stated 2:48 PM BP 115/57 mmHg  Pulse 65  Temp(Src) 97.3 F (36.3 C) (Oral)  Resp 20  Wt 267 lb 11.2 oz (121.428 kg)  Wt Readings from Last 3 Encounters:  04/21/15 267 lb 11.2 oz (121.428 kg)  04/03/15 265 lb 14.4 oz (120.611 kg)  03/30/15 265 lb (120.203 kg)

## 2015-04-24 NOTE — Progress Notes (Signed)
  Radiation Oncology         [phone] ________________________________  Name: Ronald Johnson MRN: [id number]  Date: 04/07/2015  DOB: 08-19-1946  SIMULATION AND TREATMENT PLANNING NOTE  DIAGNOSIS:  Cancer of the GE junction   NARRATIVE:  The patient was brought to the [facility].  Identity was confirmed.  All relevant records and images related to the planned course of therapy were reviewed.   Written consent to proceed with treatment was confirmed which was freely given after reviewing the details related to the planned course of therapy had been reviewed with the patient.  Then, the patient was set-up in a stable reproducible  supine position for radiation therapy.  CT images were obtained.  Surface markings were placed.    Medically necessary complex treatment device(s) for immobilization:  Customized vac lock bag.   The CT images were loaded into the planning software.  Then the target and avoidance structures were contoured.  Treatment planning then occurred.  The radiation prescription was entered and confirmed.  The patient will be treated on our tomotherapy unit corresponding to a 3-D conformal technique. A total of 6 complex treatment devices were fabricated which relate to the designed radiation treatment fields. Each of these customized fields/ complex treatment devices will be used on a daily basis during the radiation course. I have requested : 3D Simulation  I have requested a DVH of the following structures: Target volume, spinal cord, liver, heart.   The patient will undergo daily image guidance to ensure accurate localization of the target, and adequate minimize dose to the normal surrounding structures in close proximity to the target.  PLAN:  The patient will initially receive 45 Gy in 25 fractions. It is anticipated that the patient then would receive a 5.4 gray boost for a total dose of 50.4 gray.  ________________________________   Jodelle Gross, MD,  PhD

## 2015-04-24 NOTE — Progress Notes (Signed)
  Radiation Oncology         [phone] ________________________________  Name: Ronald Johnson MRN: [id number]  Date: 04/07/2015  DOB: 1945-12-06  Optical Surface Tracking Plan:  Since intensity modulated radiotherapy (IMRT) and 3D conformal radiation treatment methods are predicated on accurate and precise positioning for treatment, intrafraction motion monitoring is medically necessary to ensure accurate and safe treatment delivery.  The ability to quantify intrafraction motion without excessive ionizing radiation dose can only be performed with optical surface tracking. Accordingly, surface imaging offers the opportunity to obtain 3D measurements of patient position throughout IMRT and 3D treatments without excessive radiation exposure.  I am ordering optical surface tracking for this patient's upcoming course of radiotherapy. ________________________________  Kyung Rudd, MD 04/24/2015 7:45 PM    Reference:   Particia Jasper, et al. Surface imaging-based analysis of intrafraction motion for breast radiotherapy patients.Journal of [place], n. 6, nov. 2014. ISSN [id number].   Available at: <http://www.jacmp.org/index.php/jacmp/article/view/4957>.

## 2015-04-25 ENCOUNTER — Ambulatory Visit
Admission: RE | Admit: 2015-04-25 | Discharge: 2015-04-25 | Disposition: A | Discharge status: Home / Self Care | Payer: Medicare Other | Source: Ambulatory Visit | Attending: Radiation Oncology | Admitting: Radiation Oncology

## 2015-04-25 ENCOUNTER — Telehealth: Payer: Self-pay

## 2015-04-25 DIAGNOSIS — N529 Male erectile dysfunction, unspecified: Secondary | ICD-10-CM | POA: Diagnosis not present

## 2015-04-25 DIAGNOSIS — D649 Anemia, unspecified: Secondary | ICD-10-CM | POA: Diagnosis not present

## 2015-04-25 DIAGNOSIS — K2100 Gastro-esophageal reflux disease with esophagitis, without bleeding: Secondary | ICD-10-CM

## 2015-04-25 DIAGNOSIS — K219 Gastro-esophageal reflux disease without esophagitis: Secondary | ICD-10-CM | POA: Diagnosis not present

## 2015-04-25 DIAGNOSIS — C160 Malignant neoplasm of cardia: Secondary | ICD-10-CM | POA: Diagnosis not present

## 2015-04-25 DIAGNOSIS — Z510 Encounter for antineoplastic radiation therapy: Secondary | ICD-10-CM | POA: Diagnosis not present

## 2015-04-25 DIAGNOSIS — J449 Chronic obstructive pulmonary disease, unspecified: Secondary | ICD-10-CM | POA: Diagnosis not present

## 2015-04-25 DIAGNOSIS — R1013 Epigastric pain: Secondary | ICD-10-CM

## 2015-04-25 MED ORDER — OMEPRAZOLE 40 MG PO CPDR: 40.0000 mg | DELAYED_RELEASE_CAPSULE | Freq: Every day | ORAL | Status: DC

## 2015-04-25 NOTE — Telephone Encounter (Signed)
Refilled Omeprazole 

## 2015-04-26 ENCOUNTER — Ambulatory Visit
Admission: RE | Admit: 2015-04-26 | Discharge: 2015-04-26 | Disposition: A | Discharge status: Home / Self Care | Payer: Medicare Other | Source: Ambulatory Visit | Attending: Radiation Oncology | Admitting: Radiation Oncology

## 2015-04-26 DIAGNOSIS — J449 Chronic obstructive pulmonary disease, unspecified: Secondary | ICD-10-CM | POA: Diagnosis not present

## 2015-04-26 DIAGNOSIS — D649 Anemia, unspecified: Secondary | ICD-10-CM | POA: Diagnosis not present

## 2015-04-26 DIAGNOSIS — K219 Gastro-esophageal reflux disease without esophagitis: Secondary | ICD-10-CM | POA: Diagnosis not present

## 2015-04-26 DIAGNOSIS — Z510 Encounter for antineoplastic radiation therapy: Secondary | ICD-10-CM | POA: Diagnosis not present

## 2015-04-26 DIAGNOSIS — N529 Male erectile dysfunction, unspecified: Secondary | ICD-10-CM | POA: Diagnosis not present

## 2015-04-26 DIAGNOSIS — C160 Malignant neoplasm of cardia: Secondary | ICD-10-CM | POA: Diagnosis not present

## 2015-04-27 ENCOUNTER — Ambulatory Visit (HOSPITAL_BASED_OUTPATIENT_CLINIC_OR_DEPARTMENT_OTHER): Payer: Medicare Other

## 2015-04-27 ENCOUNTER — Ambulatory Visit
Admission: RE | Admit: 2015-04-27 | Discharge: 2015-04-27 | Disposition: A | Discharge status: Home / Self Care | Payer: Medicare Other | Source: Ambulatory Visit | Attending: Radiation Oncology | Admitting: Radiation Oncology

## 2015-04-27 ENCOUNTER — Ambulatory Visit (HOSPITAL_BASED_OUTPATIENT_CLINIC_OR_DEPARTMENT_OTHER): Payer: Medicare Other | Admitting: Oncology

## 2015-04-27 ENCOUNTER — Other Ambulatory Visit (HOSPITAL_BASED_OUTPATIENT_CLINIC_OR_DEPARTMENT_OTHER): Payer: Medicare Other

## 2015-04-27 ENCOUNTER — Other Ambulatory Visit: Payer: Medicare Other

## 2015-04-27 ENCOUNTER — Telehealth: Payer: Self-pay | Admitting: Oncology

## 2015-04-27 ENCOUNTER — Encounter: Payer: Self-pay | Admitting: *Deleted

## 2015-04-27 VITALS — BP 135/61 | HR 88 | Temp 98.0°F | Resp 21 | Ht 76.0 in | Wt 262.5 lb

## 2015-04-27 DIAGNOSIS — D649 Anemia, unspecified: Secondary | ICD-10-CM | POA: Diagnosis not present

## 2015-04-27 DIAGNOSIS — J449 Chronic obstructive pulmonary disease, unspecified: Secondary | ICD-10-CM | POA: Diagnosis not present

## 2015-04-27 DIAGNOSIS — C160 Malignant neoplasm of cardia: Secondary | ICD-10-CM | POA: Diagnosis not present

## 2015-04-27 DIAGNOSIS — Z5111 Encounter for antineoplastic chemotherapy: Secondary | ICD-10-CM

## 2015-04-27 DIAGNOSIS — I10 Essential (primary) hypertension: Secondary | ICD-10-CM | POA: Diagnosis not present

## 2015-04-27 DIAGNOSIS — N529 Male erectile dysfunction, unspecified: Secondary | ICD-10-CM | POA: Diagnosis not present

## 2015-04-27 DIAGNOSIS — C159 Malignant neoplasm of esophagus, unspecified: Secondary | ICD-10-CM

## 2015-04-27 DIAGNOSIS — E119 Type 2 diabetes mellitus without complications: Secondary | ICD-10-CM

## 2015-04-27 DIAGNOSIS — K219 Gastro-esophageal reflux disease without esophagitis: Secondary | ICD-10-CM | POA: Diagnosis not present

## 2015-04-27 DIAGNOSIS — Z510 Encounter for antineoplastic radiation therapy: Secondary | ICD-10-CM | POA: Diagnosis not present

## 2015-04-27 LAB — CBC WITH DIFFERENTIAL/PLATELET
BASO%: 0.2 % (ref 0.0–2.0)
Basophils Absolute: 0 10*3/uL (ref 0.0–0.1)
EOS ABS: 0.2 10*3/uL (ref 0.0–0.5)
EOS%: 3.7 % (ref 0.0–7.0)
HEMATOCRIT: 36 % — AB (ref 38.4–49.9)
HGB: 12.4 g/dL — ABNORMAL LOW (ref 13.0–17.1)
LYMPH%: 17.6 % (ref 14.0–49.0)
MCH: 31.2 pg (ref 27.2–33.4)
MCHC: 34.4 g/dL (ref 32.0–36.0)
MCV: 90.7 fL (ref 79.3–98.0)
MONO#: 0.2 10*3/uL (ref 0.1–0.9)
MONO%: 5.9 % (ref 0.0–14.0)
NEUT#: 3 10*3/uL (ref 1.5–6.5)
NEUT%: 72.6 % (ref 39.0–75.0)
PLATELETS: 159 10*3/uL (ref 140–400)
RBC: 3.97 10*6/uL — ABNORMAL LOW (ref 4.20–5.82)
RDW: 12.2 % (ref 11.0–14.6)
WBC: 4.1 10*3/uL (ref 4.0–10.3)
lymph#: 0.7 10*3/uL — ABNORMAL LOW (ref 0.9–3.3)
nRBC: 0 % (ref 0–0)

## 2015-04-27 MED ORDER — SODIUM CHLORIDE 0.9 % IV SOLN
Freq: Once | INTRAVENOUS | Status: AC
  Administered 2015-04-27: 11:00:00 via INTRAVENOUS
  Filled 2015-04-27: qty 8

## 2015-04-27 MED ORDER — DIPHENHYDRAMINE HCL 50 MG/ML IJ SOLN
25.0000 mg | Freq: Once | INTRAMUSCULAR | Status: AC
  Administered 2015-04-27: 25 mg via INTRAVENOUS

## 2015-04-27 MED ORDER — SODIUM CHLORIDE 0.9 % IV SOLN
50.0000 mg/m2 | Freq: Once | INTRAVENOUS | Status: AC
  Administered 2015-04-27: 126 mg via INTRAVENOUS
  Filled 2015-04-27: qty 21

## 2015-04-27 MED ORDER — DIPHENHYDRAMINE HCL 50 MG/ML IJ SOLN
INTRAMUSCULAR | Status: AC
  Filled 2015-04-27: qty 1

## 2015-04-27 MED ORDER — FAMOTIDINE IN NACL 20-0.9 MG/50ML-% IV SOLN
INTRAVENOUS | Status: AC
Start: 2015-04-27 — End: 2015-04-27
  Filled 2015-04-27: qty 50

## 2015-04-27 MED ORDER — SODIUM CHLORIDE 0.9 % IV SOLN
200.0000 mg | Freq: Once | INTRAVENOUS | Status: AC
  Administered 2015-04-27: 200 mg via INTRAVENOUS
  Filled 2015-04-27: qty 20

## 2015-04-27 MED ORDER — SODIUM CHLORIDE 0.9 % IV SOLN
Freq: Once | INTRAVENOUS | Status: AC
  Administered 2015-04-27: 11:00:00 via INTRAVENOUS

## 2015-04-27 MED ORDER — FAMOTIDINE IN NACL 20-0.9 MG/50ML-% IV SOLN
20.0000 mg | Freq: Once | INTRAVENOUS | Status: AC
  Administered 2015-04-27: 20 mg via INTRAVENOUS

## 2015-04-27 MED ORDER — MIRTAZAPINE 15 MG PO TABS: 15.0000 mg | ORAL_TABLET | Freq: Every day | ORAL | Status: DC

## 2015-04-27 NOTE — Progress Notes (Signed)
Per Dr. [name]; OK to proceed with tx based on CMET results from 04/20/15

## 2015-04-27 NOTE — Patient Instructions (Signed)
[facility] Discharge Instructions for Patients Receiving Chemotherapy  Today you received the following chemotherapy agents Taxol and Carboplatin  To help prevent nausea and vomiting after your treatment, we encourage you to take your nausea medication Compazine 10 mg every 6 hours as needed.   If you develop nausea and vomiting that is not controlled by your nausea medication, call the clinic.   BELOW ARE SYMPTOMS THAT SHOULD BE REPORTED IMMEDIATELY:  *FEVER GREATER THAN 100.5 F  *CHILLS WITH OR WITHOUT FEVER  NAUSEA AND VOMITING THAT IS NOT CONTROLLED WITH YOUR NAUSEA MEDICATION  *UNUSUAL SHORTNESS OF BREATH  *UNUSUAL BRUISING OR BLEEDING  TENDERNESS IN MOUTH AND THROAT WITH OR WITHOUT PRESENCE OF ULCERS  *URINARY PROBLEMS  *BOWEL PROBLEMS  UNUSUAL RASH Items with * indicate a potential emergency and should be followed up as soon as possible.  Feel free to call the clinic you have any questions or concerns. The clinic phone number is [phone].  Please show the CHEMO ALERT CARD at check-in to the Emergency Department and triage nurse.   

## 2015-04-27 NOTE — Progress Notes (Signed)
Oncology Nurse Navigator Documentation  Oncology Nurse Navigator Flowsheets 04/27/2015  Referral date to RadOnc/MedOnc -  Navigator Encounter Type Treatment  Patient Visit Type Medonc  Treatment Phase Treatment-Taxol/Carbo #2  Barriers/Navigation Needs No barriers at this time  Education -  Interventions None required  Referrals -  Education Method -  Support Groups/Services -  Time Spent with Patient -

## 2015-04-27 NOTE — Telephone Encounter (Signed)
Gave patient avs report and appointments for September.  °

## 2015-04-27 NOTE — Addendum Note (Signed)
Addended by: Adalberto Cole on: 04/27/2015 11:24 AM   Modules accepted: Orders, Medications

## 2015-04-27 NOTE — Progress Notes (Signed)
  [place] OFFICE PROGRESS NOTE   Diagnosis: GE junction carcinoma  INTERVAL HISTORY:   [name] returns as scheduled. He began concurrent chemotherapy and radiation last week. He tolerated chemotherapy well. No symptom of allergic reaction. He is eating. He reports hyperglycemia with his blood sugar running greater than 300 since completed chemotherapy. He would like to try chemotherapy without Decadron beginning today. He has noted improvement in the depression with Remeron.  Objective:  Vital signs in last 24 hours:  Blood pressure 135/61, pulse 88, temperature 98 F (36.7 C), temperature source Oral, resp. rate 21, height 6\' 4"  (1.93 m), weight 262 lb 8 oz (119.069 kg), SpO2 97 %.    HEENT: No thrush or ulcers Resp: Distant breath sounds, no respiratory distress Cardio: Regular rate and rhythm GI: No hepatomegaly, nontender Vascular: No leg edema   Lab Results:  Lab Results  Component Value Date   WBC 4.1 04/27/2015   HGB 12.4* 04/27/2015   HCT 36.0* 04/27/2015   MCV 90.7 04/27/2015   PLT 159 04/27/2015   NEUTROABS 3.0 04/27/2015     Medications: I have reviewed the patient's current medications.  Assessment/Plan: 1. 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 Postprandial subxiphoid pain secondary to #1  3. Diabetes-elevated blood sugar readings at the [facility] and at home  4. COPD  5. Depression  6. Hypertension  7. History of adenomatous colon polyps  8. History of diabetic related neuropathy, peripheral arterial disease, and nephropathy  Disposition:  Ronald Johnson appears to be tolerating the treatment well. The plan is to proceed with week 2  Taxol/carboplatin today. We will eliminate the Decadron as part of the premedication regimen secondary to marked hyperglycemia. He will schedule an appointment with Dr. Loanne Drilling this week to adjust the insulin regimen.  Ronald Johnson will return for chemotherapy next week and an office visit in 2 weeks.     Betsy Coder, MD  04/27/2015  9:13 AM

## 2015-04-28 ENCOUNTER — Ambulatory Visit
Admission: RE | Admit: 2015-04-28 | Discharge: 2015-04-28 | Disposition: A | Discharge status: Home / Self Care | Payer: Medicare Other | Source: Ambulatory Visit | Attending: Radiation Oncology | Admitting: Radiation Oncology

## 2015-04-28 ENCOUNTER — Encounter: Payer: Self-pay | Admitting: Radiation Oncology

## 2015-04-28 ENCOUNTER — Ambulatory Visit (INDEPENDENT_AMBULATORY_CARE_PROVIDER_SITE_OTHER): Payer: Medicare Other | Admitting: Endocrinology

## 2015-04-28 ENCOUNTER — Encounter: Payer: Self-pay | Admitting: Endocrinology

## 2015-04-28 VITALS — BP 132/64 | HR 91 | Temp 97.9°F | Ht 76.0 in | Wt 261.0 lb

## 2015-04-28 VITALS — BP 109/66 | HR 87 | Temp 98.2°F | Resp 20 | Wt 261.9 lb

## 2015-04-28 DIAGNOSIS — N189 Chronic kidney disease, unspecified: Secondary | ICD-10-CM

## 2015-04-28 DIAGNOSIS — K219 Gastro-esophageal reflux disease without esophagitis: Secondary | ICD-10-CM | POA: Diagnosis not present

## 2015-04-28 DIAGNOSIS — J449 Chronic obstructive pulmonary disease, unspecified: Secondary | ICD-10-CM | POA: Diagnosis not present

## 2015-04-28 DIAGNOSIS — C160 Malignant neoplasm of cardia: Secondary | ICD-10-CM | POA: Diagnosis not present

## 2015-04-28 DIAGNOSIS — Z510 Encounter for antineoplastic radiation therapy: Secondary | ICD-10-CM | POA: Diagnosis not present

## 2015-04-28 DIAGNOSIS — D649 Anemia, unspecified: Secondary | ICD-10-CM | POA: Diagnosis not present

## 2015-04-28 DIAGNOSIS — N529 Male erectile dysfunction, unspecified: Secondary | ICD-10-CM | POA: Diagnosis not present

## 2015-04-28 DIAGNOSIS — E1022 Type 1 diabetes mellitus with diabetic chronic kidney disease: Secondary | ICD-10-CM

## 2015-04-28 MED ORDER — INSULIN NPH (HUMAN) (ISOPHANE) 100 UNIT/ML ~~LOC~~ SUSP: 500.0000 [IU] | SUBCUTANEOUS | Status: DC

## 2015-04-28 NOTE — Progress Notes (Signed)
Department of Radiation Oncology  Phone:  [phone] Fax:        [phone]  Weekly Treatment Note    Name: Ronald Johnson Date: 04/28/2015 MRN: [id number] DOB: [birth year]   Current dose: 12.6 Gy  Current fraction: 7   MEDICATIONS: Current Outpatient Prescriptions  Medication Sig Dispense Refill  . atorvastatin (LIPITOR) 10 MG tablet Take 1 tablet by mouth  every day 90 tablet 1  . diazepam (VALIUM) 5 MG tablet Take 1 tablet (5 mg total) by mouth 2 (two) times daily as needed. 180 tablet 1  . glucose blood (PRECISION XTRA TEST STRIPS) test strip Check blood sugar three times a day dx 250.01 270 each 3  . hyaluronate sodium (RADIAPLEXRX) GEL Apply 1 application topically daily.    Marland Kitchen HYDROcodone-acetaminophen (NORCO/VICODIN) 5-325 MG per tablet Take 1 tablet by mouth every 6 (six) hours as needed for moderate pain. 60 tablet 0  . insulin NPH Human (NOVOLIN N RELION) 100 UNIT/ML injection Inject 5 mLs (500 Units total) into the skin every morning. And syringes 1/day 500 mL 3  . Insulin Syringe-Needle U-100 30G X 3/8" 1 ML MISC Use to inject insulin 3 times per day. 300 each 2  . losartan-hydrochlorothiazide (HYZAAR) 100-12.5 MG per tablet Take 1 tablet by mouth  daily 90 tablet 2  . mirtazapine (REMERON) 15 MG tablet Take 1 tablet (15 mg total) by mouth at bedtime. 30 tablet 1  . omeprazole (PRILOSEC) 40 MG capsule Take 1 capsule (40 mg total) by mouth daily. 30 capsule 3  . prochlorperazine (COMPAZINE) 10 MG tablet Take 1 tablet (10 mg total) by mouth every 6 (six) hours as needed for nausea. 60 tablet 1  . sildenafil (REVATIO) 20 MG tablet TAKE 1-5 TABLETS BY MOUTH ONCE DAILY AS NEEDED 50 tablet 1  . dexamethasone (DECADRON) 2 MG tablet Take 5 tablets (10 mg total) by mouth as directed. Take #5 tablets night before 1st chemo and morning of 1st chemo (Patient not taking: Reported on 04/28/2015) 10 tablet 0  . [DISCONTINUED] metoprolol succinate (TOPROL-XL) 25 MG 24 hr tablet Take  25 mg by mouth daily.      No current facility-administered medications for this encounter.     ALLERGIES: Review of patient's allergies indicates no known allergies.   LABORATORY DATA:  Lab Results  Component Value Date   WBC 4.1 04/27/2015   HGB 12.4* 04/27/2015   HCT 36.0* 04/27/2015   MCV 90.7 04/27/2015   PLT 159 04/27/2015   Lab Results  Component Value Date   NA 136 04/20/2015   K 5.1 04/20/2015   CL 103 01/31/2015   CO2 23 04/20/2015   Lab Results  Component Value Date   ALT 19 04/20/2015   AST 17 04/20/2015   ALKPHOS 84 04/20/2015   BILITOT 0.36 04/20/2015     NARRATIVE: [name] was seen today for weekly treatment management. The chart was checked and the patient's films were reviewed.  Weekly rad tx abdomen 7/28 completed, no skin changes, using radaiplex prn, gas in abdomen, mild pain, regular bowel movements, no nausea since Tuesday, Next Chemotherapy next Thursday, appetite great BP 109/66 mmHg  Pulse 87  Temp(Src) 98.2 F (36.8 C) (Oral)  Resp 20  Wt 261 lb 14.4 oz (118.797 kg)  Wt Readings from Last 3 Encounters:  04/28/15 261 lb 14.4 oz (118.797 kg)  04/28/15 261 lb (118.389 kg)  04/27/15 262 lb 8 oz (119.069 kg)     PHYSICAL EXAMINATION: weight is  261 lb 14.4 oz (118.797 kg). His oral temperature is 98.2 F (36.8 C). His blood pressure is 109/66 and his pulse is 87. His respiration is 20.        ASSESSMENT: The patient is doing satisfactorily with treatment.  PLAN: We will continue with the patient's radiation treatment as planned.

## 2015-04-28 NOTE — Progress Notes (Signed)
Weekly rad tx abdomen 7/28 completed, no skin changes, using radaiplex prn, gas in abdomen, mild pain, regular bowel movements, no nausea since Tuesday, Next Chemotherapy next Thursday, appetite great BP 109/66 mmHg  Pulse 87  Temp(Src) 98.2 F (36.8 C) (Oral)  Resp 20  Wt 261 lb 14.4 oz (118.797 kg)  Wt Readings from Last 3 Encounters:  04/28/15 261 lb 14.4 oz (118.797 kg)  04/28/15 261 lb (118.389 kg)  04/27/15 262 lb 8 oz (119.069 kg)

## 2015-04-28 NOTE — Progress Notes (Signed)
Subjective:    Patient ID: Ronald Johnson, male    DOB: 1945-09-09, 69 y.o.   MRN: [id number]  HPI Pt returns for f/u of diabetes mellitus: DM type: Insulin-requiring type 2.  Dx'ed: 8592 Complications: polyneuropathy, PAD, and nephropathy.  Therapy: insulin since 2004.  DKA: never.  Severe hypoglycemia: never. Pancreatitis: never. Other: he changed to qd insulin, after he did not achieve good control on multiple daily injections; he cannot afford insulin analogs.  Interval history:  He was recently dx'ed with esophageal cancer.  He started chemotx last week (he has had 2 of the 5 scheduled treatments).  He gets IV decadron on chemo days.  He is also receiving XRT.   For 1 week after the chemotx, cbg's were over 400 x 1 week.  cbg's have returned to the 200's. Despite all this, pt states he feels well in general.   Past Medical History  Diagnosis Date  . COLONIC POLYPS, ADENOMATOUS 08/01/2008    09/01/2012 also  . DIABETES MELLITUS, TYPE I 03/13/2007  . HYPERCHOLESTEROLEMIA 02/03/2008  . ANEMIA 08/15/2009  . Anxiety state, unspecified 12/15/2008  . DEPRESSION 03/13/2007  . HYPERTENSION 03/13/2007  . PULMONARY NODULE 11/24/2008  . GERD 03/13/2007  . TOBACCO ABUSE 11/24/2008  . EMPHYSEMA 11/08/2008  . Prostatism   . ED (erectile dysfunction)   . [name]'s esophagus   . Hiatal hernia   . Diverticulitis   . Arthritis   . COPD (chronic obstructive pulmonary disease)     Past Surgical History  Procedure Laterality Date  . Egd  11/19/2005  . Foot fracture surgery Right 1980    right foot w/ pins and screws  . Removal pins and screws foot  1980    right foot  . Upper gastrointestinal endoscopy    . Circumcision    . Colonoscopy    . Polypectomy    . Vasectomy    . Eus N/A 03/16/2015    Procedure: UPPER ENDOSCOPIC ULTRASOUND (EUS) RADIAL;  Surgeon: Milus Banister, MD;  Location: [facility];  Service: Endoscopy;  Laterality: N/A;    Social History   Social History  . Marital  Status: Married    Spouse Name: N/A  . Number of Children: 2  . Years of Education: N/A   Occupational History  . INSPECTOR     works Youth worker   Social History Main Topics  . Smoking status: Former Smoker -- 1.00 packs/day for 35 years    Types: Cigarettes    Quit date: 10/31/2008  . Smokeless tobacco: Never Used  . Alcohol Use: 0.0 oz/week    0 Standard drinks or equivalent per week     Comment: 2/month only on vacation  . Drug Use: No  . Sexual Activity: No   Other Topics Concern  . 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 atorvastatin (LIPITOR) 10 MG tablet Take 1 tablet by mouth  every day 90 tablet 1  . dexamethasone (DECADRON) 2 MG tablet Take 5 tablets (10 mg total) by mouth as directed. Take #5 tablets night before 1st chemo and morning of 1st chemo (Patient not taking: Reported on 04/28/2015) 10 tablet 0  . diazepam (VALIUM) 5 MG tablet Take 1  tablet (5 mg total) by mouth 2 (two) times daily as needed. 180 tablet 1  . glucose blood (PRECISION XTRA TEST STRIPS) test strip Check blood sugar three times a day dx 250.01 270 each 3  . hyaluronate sodium (RADIAPLEXRX) GEL Apply 1 application topically daily.    Marland Kitchen HYDROcodone-acetaminophen (NORCO/VICODIN) 5-325 MG per tablet Take 1 tablet by mouth every 6 (six) hours as needed for moderate pain. 60 tablet 0  . Insulin Syringe-Needle U-100 30G X 3/8" 1 ML MISC Use to inject insulin 3 times per day. 300 each 2  . losartan-hydrochlorothiazide (HYZAAR) 100-12.5 MG per tablet Take 1 tablet by mouth  daily 90 tablet 2  . mirtazapine (REMERON) 15 MG tablet Take 1 tablet (15 mg total) by mouth at bedtime. 30 tablet 1  . omeprazole (PRILOSEC) 40 MG capsule Take 1 capsule (40 mg  total) by mouth daily. 30 capsule 3  . sildenafil (REVATIO) 20 MG tablet TAKE 1-5 TABLETS BY MOUTH ONCE DAILY AS NEEDED 50 tablet 1  . prochlorperazine (COMPAZINE) 10 MG tablet Take 1 tablet (10 mg total) by mouth every 6 (six) hours as needed for nausea. 60 tablet 1  . [DISCONTINUED] metoprolol succinate (TOPROL-XL) 25 MG 24 hr tablet Take 25 mg by mouth daily.      No current facility-administered medications on file prior to visit.    No Known Allergies  Family History  Problem Relation Age of Onset  . Stroke Mother   . Diabetes Mother   . Colon cancer Neg Hx   . Diabetes Maternal Grandmother     mother side of the family aunts, MGF  . Breast cancer Paternal Grandmother     BP 132/64 mmHg  Pulse 91  Temp(Src) 97.9 F (36.6 C) (Oral)  Ht _0  (1.93 m)  Wt 261 lb (118.389 kg)  BMI 31.78 kg/m2  SpO2 93%  Review of Systems Denies n/v/sob.     Objective:   Physical Exam VITAL SIGNS:  See vs page GENERAL: no distress Pulses: dorsalis pedis intact bilat.   MSK: no deformity of the feet CV: no leg edema Skin:  no ulcer on the feet.  normal color and temp on the feet. Neuro: sensation is intact to touch on the feet.   Lab Results  Component Value Date   HGBA1C 8 0 03/03/2015       Assessment & Plan:  DM: worse Esophageal cancer, new.  Decadron complicates the rx of his DM, but I told pt we'll work around that.   Patient is advised the following: Patient Instructions  Please continue the same insulin for now.  However, starting with your next steroid shot, please increase the insulin to 500 units each morning.  Please call if this doesn't help.  1 week after your last steroid shot, go back to 330 units each morning. If you are going to be active, take just 200 units that morning.  check your blood sugar twice a day.  vary the time of day when you check, between before the 3 meals, and at bedtime.  also check if you have symptoms of your blood sugar being too high or  too low.  please keep a record of the readings and bring it to your next appointment here.  You can write it on any piece of paper.  please call us sooner if your blood sugar goes below 70, or if you have a lot of readings over 200.   Please come back for a follow-up appointment  in 3 months.

## 2015-04-28 NOTE — Patient Instructions (Signed)
Please continue the same insulin for now.  However, starting with your next steroid shot, please increase the insulin to 500 units each morning.  Please call if this doesn't help.  1 week after your last steroid shot, go back to 330 units each morning. If you are going to be active, take just 200 units that morning.  check your blood sugar twice a day.  vary the time of day when you check, between before the 3 meals, and at bedtime.  also check if you have symptoms of your blood sugar being too high or too low.  please keep a record of the readings and bring it to your next appointment here.  You can write it on any piece of paper.  please call us sooner if your blood sugar goes below 70, or if you have a lot of readings over 200.   Please come back for a follow-up appointment in 3 months.

## 2015-05-01 ENCOUNTER — Ambulatory Visit
Admission: RE | Admit: 2015-05-01 | Discharge: 2015-05-01 | Disposition: A | Discharge status: Home / Self Care | Payer: Medicare Other | Source: Ambulatory Visit | Attending: Radiation Oncology | Admitting: Radiation Oncology

## 2015-05-01 DIAGNOSIS — N529 Male erectile dysfunction, unspecified: Secondary | ICD-10-CM | POA: Diagnosis not present

## 2015-05-01 DIAGNOSIS — C160 Malignant neoplasm of cardia: Secondary | ICD-10-CM | POA: Diagnosis not present

## 2015-05-01 DIAGNOSIS — D649 Anemia, unspecified: Secondary | ICD-10-CM | POA: Diagnosis not present

## 2015-05-01 DIAGNOSIS — J449 Chronic obstructive pulmonary disease, unspecified: Secondary | ICD-10-CM | POA: Diagnosis not present

## 2015-05-01 DIAGNOSIS — Z510 Encounter for antineoplastic radiation therapy: Secondary | ICD-10-CM | POA: Diagnosis not present

## 2015-05-01 DIAGNOSIS — K219 Gastro-esophageal reflux disease without esophagitis: Secondary | ICD-10-CM | POA: Diagnosis not present

## 2015-05-02 ENCOUNTER — Ambulatory Visit
Admission: RE | Admit: 2015-05-02 | Discharge: 2015-05-02 | Disposition: A | Discharge status: Home / Self Care | Payer: Medicare Other | Source: Ambulatory Visit | Attending: Radiation Oncology | Admitting: Radiation Oncology

## 2015-05-02 ENCOUNTER — Encounter: Payer: Self-pay | Admitting: Radiation Oncology

## 2015-05-02 ENCOUNTER — Other Ambulatory Visit: Payer: Self-pay | Admitting: Radiation Oncology

## 2015-05-02 ENCOUNTER — Ambulatory Visit: Payer: Medicare Other | Admitting: Radiation Oncology

## 2015-05-02 DIAGNOSIS — C160 Malignant neoplasm of cardia: Secondary | ICD-10-CM | POA: Diagnosis not present

## 2015-05-02 DIAGNOSIS — D649 Anemia, unspecified: Secondary | ICD-10-CM | POA: Diagnosis not present

## 2015-05-02 DIAGNOSIS — Z510 Encounter for antineoplastic radiation therapy: Secondary | ICD-10-CM | POA: Diagnosis not present

## 2015-05-02 DIAGNOSIS — J449 Chronic obstructive pulmonary disease, unspecified: Secondary | ICD-10-CM | POA: Diagnosis not present

## 2015-05-02 DIAGNOSIS — K219 Gastro-esophageal reflux disease without esophagitis: Secondary | ICD-10-CM | POA: Diagnosis not present

## 2015-05-02 DIAGNOSIS — N529 Male erectile dysfunction, unspecified: Secondary | ICD-10-CM | POA: Diagnosis not present

## 2015-05-02 MED ORDER — SUCRALFATE 1 G PO TABS: 1.0000 g | ORAL_TABLET | Freq: Four times a day (QID) | ORAL | Status: DC

## 2015-05-02 NOTE — Progress Notes (Signed)
The patient is experiencing some heartburn. He was given a prescription for Carafate and she will continue to take Prilosec.

## 2015-05-02 NOTE — Progress Notes (Signed)
Pot came to nursing c/o heartburn since Saturday ,anything he eats or drinks, "Bad", takes prilosec,not helping stated patient request to see MD 10:53 AM BP 139/70 mmHg  Pulse 99  Temp(Src) 97.6 F (36.4 C) (Oral)  Resp 20  Wt 261 lb (118.389 kg)  Wt Readings from Last 3 Encounters:  05/02/15 261 lb (118.389 kg)  04/28/15 261 lb 14.4 oz (118.797 kg)  04/28/15 261 lb (118.389 kg)   Requested to  10:53 AM

## 2015-05-03 ENCOUNTER — Ambulatory Visit
Admission: RE | Admit: 2015-05-03 | Discharge: 2015-05-03 | Disposition: A | Discharge status: Home / Self Care | Payer: Medicare Other | Source: Ambulatory Visit | Attending: Radiation Oncology | Admitting: Radiation Oncology

## 2015-05-03 DIAGNOSIS — Z510 Encounter for antineoplastic radiation therapy: Secondary | ICD-10-CM | POA: Diagnosis not present

## 2015-05-03 DIAGNOSIS — C160 Malignant neoplasm of cardia: Secondary | ICD-10-CM | POA: Diagnosis not present

## 2015-05-03 DIAGNOSIS — D649 Anemia, unspecified: Secondary | ICD-10-CM | POA: Diagnosis not present

## 2015-05-03 DIAGNOSIS — J449 Chronic obstructive pulmonary disease, unspecified: Secondary | ICD-10-CM | POA: Diagnosis not present

## 2015-05-03 DIAGNOSIS — K219 Gastro-esophageal reflux disease without esophagitis: Secondary | ICD-10-CM | POA: Diagnosis not present

## 2015-05-03 DIAGNOSIS — N529 Male erectile dysfunction, unspecified: Secondary | ICD-10-CM | POA: Diagnosis not present

## 2015-05-04 ENCOUNTER — Ambulatory Visit (HOSPITAL_BASED_OUTPATIENT_CLINIC_OR_DEPARTMENT_OTHER): Payer: Medicare Other

## 2015-05-04 ENCOUNTER — Other Ambulatory Visit (HOSPITAL_BASED_OUTPATIENT_CLINIC_OR_DEPARTMENT_OTHER): Payer: Medicare Other

## 2015-05-04 ENCOUNTER — Ambulatory Visit
Admission: RE | Admit: 2015-05-04 | Discharge: 2015-05-04 | Disposition: A | Discharge status: Home / Self Care | Payer: Medicare Other | Source: Ambulatory Visit | Attending: Radiation Oncology | Admitting: Radiation Oncology

## 2015-05-04 ENCOUNTER — Encounter: Payer: Self-pay | Admitting: Radiation Oncology

## 2015-05-04 VITALS — BP 129/80 | HR 97 | Temp 96.9°F | Resp 18

## 2015-05-04 DIAGNOSIS — J449 Chronic obstructive pulmonary disease, unspecified: Secondary | ICD-10-CM | POA: Diagnosis not present

## 2015-05-04 DIAGNOSIS — D649 Anemia, unspecified: Secondary | ICD-10-CM | POA: Diagnosis not present

## 2015-05-04 DIAGNOSIS — Z510 Encounter for antineoplastic radiation therapy: Secondary | ICD-10-CM | POA: Diagnosis not present

## 2015-05-04 DIAGNOSIS — Z5111 Encounter for antineoplastic chemotherapy: Secondary | ICD-10-CM | POA: Diagnosis present

## 2015-05-04 DIAGNOSIS — C160 Malignant neoplasm of cardia: Secondary | ICD-10-CM

## 2015-05-04 DIAGNOSIS — K219 Gastro-esophageal reflux disease without esophagitis: Secondary | ICD-10-CM | POA: Diagnosis not present

## 2015-05-04 DIAGNOSIS — N529 Male erectile dysfunction, unspecified: Secondary | ICD-10-CM | POA: Diagnosis not present

## 2015-05-04 LAB — CBC WITH DIFFERENTIAL/PLATELET
BASO%: 0.3 % (ref 0.0–2.0)
Basophils Absolute: 0 10*3/uL (ref 0.0–0.1)
EOS%: 3.3 % (ref 0.0–7.0)
Eosinophils Absolute: 0.1 10*3/uL (ref 0.0–0.5)
HCT: 36.4 % — ABNORMAL LOW (ref 38.4–49.9)
HGB: 12.5 g/dL — ABNORMAL LOW (ref 13.0–17.1)
LYMPH#: 0.7 10*3/uL — AB (ref 0.9–3.3)
LYMPH%: 24.2 % (ref 14.0–49.0)
MCH: 31.3 pg (ref 27.2–33.4)
MCHC: 34.3 g/dL (ref 32.0–36.0)
MCV: 91.2 fL (ref 79.3–98.0)
MONO#: 0.3 10*3/uL (ref 0.1–0.9)
MONO%: 10.3 % (ref 0.0–14.0)
NEUT#: 1.9 10*3/uL (ref 1.5–6.5)
NEUT%: 61.9 % (ref 39.0–75.0)
Platelets: 135 10*3/uL — ABNORMAL LOW (ref 140–400)
RBC: 3.99 10*6/uL — AB (ref 4.20–5.82)
RDW: 12.3 % (ref 11.0–14.6)
WBC: 3 10*3/uL — ABNORMAL LOW (ref 4.0–10.3)

## 2015-05-04 LAB — COMPREHENSIVE METABOLIC PANEL (CC13)
ALT: 14 U/L (ref 0–55)
AST: 15 U/L (ref 5–34)
Albumin: 3.5 g/dL (ref 3.5–5.0)
Alkaline Phosphatase: 77 U/L (ref 40–150)
Anion Gap: 7 mEq/L (ref 3–11)
BUN: 21.8 mg/dL (ref 7.0–26.0)
CHLORIDE: 105 meq/L (ref 98–109)
CO2: 26 meq/L (ref 22–29)
CREATININE: 1.2 mg/dL (ref 0.7–1.3)
Calcium: 9.8 mg/dL (ref 8.4–10.4)
EGFR: 61 mL/min/{1.73_m2} — ABNORMAL LOW (ref >90)
GLUCOSE: 180 mg/dL — AB (ref 70–140)
Potassium: 4.4 mEq/L (ref 3.5–5.1)
Sodium: 137 mEq/L (ref 136–145)
Total Bilirubin: 0.36 mg/dL (ref 0.20–1.20)
Total Protein: 6.7 g/dL (ref 6.4–8.3)

## 2015-05-04 MED ORDER — FAMOTIDINE IN NACL 20-0.9 MG/50ML-% IV SOLN
20.0000 mg | Freq: Once | INTRAVENOUS | Status: AC
  Administered 2015-05-04: 20 mg via INTRAVENOUS

## 2015-05-04 MED ORDER — SODIUM CHLORIDE 0.9 % IV SOLN
50.0000 mg/m2 | Freq: Once | INTRAVENOUS | Status: AC
  Administered 2015-05-04: 126 mg via INTRAVENOUS
  Filled 2015-05-04: qty 21

## 2015-05-04 MED ORDER — SODIUM CHLORIDE 0.9 % IV SOLN
Freq: Once | INTRAVENOUS | Status: AC
  Administered 2015-05-04: 12:00:00 via INTRAVENOUS

## 2015-05-04 MED ORDER — SODIUM CHLORIDE 0.9 % IV SOLN
Freq: Once | INTRAVENOUS | Status: AC
  Administered 2015-05-04: 12:00:00 via INTRAVENOUS
  Filled 2015-05-04: qty 8

## 2015-05-04 MED ORDER — DIPHENHYDRAMINE HCL 50 MG/ML IJ SOLN
INTRAMUSCULAR | Status: AC
  Filled 2015-05-04: qty 1

## 2015-05-04 MED ORDER — SODIUM CHLORIDE 0.9 % IV SOLN
250.0000 mg | Freq: Once | INTRAVENOUS | Status: AC
  Administered 2015-05-04: 250 mg via INTRAVENOUS
  Filled 2015-05-04: qty 25

## 2015-05-04 MED ORDER — FAMOTIDINE IN NACL 20-0.9 MG/50ML-% IV SOLN
INTRAVENOUS | Status: AC
  Filled 2015-05-04: qty 50

## 2015-05-04 MED ORDER — DIPHENHYDRAMINE HCL 50 MG/ML IJ SOLN
25.0000 mg | Freq: Once | INTRAMUSCULAR | Status: AC
  Administered 2015-05-04: 25 mg via INTRAVENOUS

## 2015-05-04 NOTE — Patient Instructions (Signed)
[facility] Discharge Instructions for Patients Receiving Chemotherapy  Today you received the following chemotherapy agents Taxol and Carboplatin. To help prevent nausea and vomiting after your treatment, we encourage you to take your nausea medication as directed.  If you develop nausea and vomiting that is not controlled by your nausea medication, call the clinic.   BELOW ARE SYMPTOMS THAT SHOULD BE REPORTED IMMEDIATELY:  *FEVER GREATER THAN 100.5 F  *CHILLS WITH OR WITHOUT FEVER  NAUSEA AND VOMITING THAT IS NOT CONTROLLED WITH YOUR NAUSEA MEDICATION  *UNUSUAL SHORTNESS OF BREATH  *UNUSUAL BRUISING OR BLEEDING  TENDERNESS IN MOUTH AND THROAT WITH OR WITHOUT PRESENCE OF ULCERS  *URINARY PROBLEMS  *BOWEL PROBLEMS  UNUSUAL RASH Items with * indicate a potential emergency and should be followed up as soon as possible.  Feel free to call the clinic you have any questions or concerns. The clinic phone number is [phone].  Please show the CHEMO ALERT CARD at check-in to the Emergency Department and triage nurse.    

## 2015-05-05 ENCOUNTER — Ambulatory Visit
Admission: RE | Admit: 2015-05-05 | Discharge: 2015-05-05 | Disposition: A | Discharge status: Home / Self Care | Payer: Medicare Other | Source: Ambulatory Visit | Attending: Radiation Oncology | Admitting: Radiation Oncology

## 2015-05-05 DIAGNOSIS — K219 Gastro-esophageal reflux disease without esophagitis: Secondary | ICD-10-CM | POA: Diagnosis not present

## 2015-05-05 DIAGNOSIS — N529 Male erectile dysfunction, unspecified: Secondary | ICD-10-CM | POA: Diagnosis not present

## 2015-05-05 DIAGNOSIS — C160 Malignant neoplasm of cardia: Secondary | ICD-10-CM | POA: Diagnosis not present

## 2015-05-05 DIAGNOSIS — Z510 Encounter for antineoplastic radiation therapy: Secondary | ICD-10-CM | POA: Diagnosis not present

## 2015-05-05 DIAGNOSIS — D649 Anemia, unspecified: Secondary | ICD-10-CM | POA: Diagnosis not present

## 2015-05-05 DIAGNOSIS — J449 Chronic obstructive pulmonary disease, unspecified: Secondary | ICD-10-CM | POA: Diagnosis not present

## 2015-05-07 ENCOUNTER — Other Ambulatory Visit: Payer: Self-pay | Admitting: Oncology

## 2015-05-08 ENCOUNTER — Ambulatory Visit
Admission: RE | Admit: 2015-05-08 | Discharge: 2015-05-08 | Disposition: A | Discharge status: Home / Self Care | Payer: Medicare Other | Source: Ambulatory Visit | Attending: Radiation Oncology | Admitting: Radiation Oncology

## 2015-05-08 DIAGNOSIS — N529 Male erectile dysfunction, unspecified: Secondary | ICD-10-CM | POA: Diagnosis not present

## 2015-05-08 DIAGNOSIS — C160 Malignant neoplasm of cardia: Secondary | ICD-10-CM | POA: Diagnosis not present

## 2015-05-08 DIAGNOSIS — K219 Gastro-esophageal reflux disease without esophagitis: Secondary | ICD-10-CM | POA: Diagnosis not present

## 2015-05-08 DIAGNOSIS — J449 Chronic obstructive pulmonary disease, unspecified: Secondary | ICD-10-CM | POA: Diagnosis not present

## 2015-05-08 DIAGNOSIS — Z510 Encounter for antineoplastic radiation therapy: Secondary | ICD-10-CM | POA: Diagnosis not present

## 2015-05-08 DIAGNOSIS — D649 Anemia, unspecified: Secondary | ICD-10-CM | POA: Diagnosis not present

## 2015-05-09 ENCOUNTER — Ambulatory Visit
Admission: RE | Admit: 2015-05-09 | Discharge: 2015-05-09 | Disposition: A | Discharge status: Home / Self Care | Payer: Medicare Other | Source: Ambulatory Visit | Attending: Radiation Oncology | Admitting: Radiation Oncology

## 2015-05-09 DIAGNOSIS — N529 Male erectile dysfunction, unspecified: Secondary | ICD-10-CM | POA: Diagnosis not present

## 2015-05-09 DIAGNOSIS — D649 Anemia, unspecified: Secondary | ICD-10-CM | POA: Diagnosis not present

## 2015-05-09 DIAGNOSIS — C160 Malignant neoplasm of cardia: Secondary | ICD-10-CM | POA: Diagnosis not present

## 2015-05-09 DIAGNOSIS — J449 Chronic obstructive pulmonary disease, unspecified: Secondary | ICD-10-CM | POA: Diagnosis not present

## 2015-05-09 DIAGNOSIS — K219 Gastro-esophageal reflux disease without esophagitis: Secondary | ICD-10-CM | POA: Diagnosis not present

## 2015-05-09 DIAGNOSIS — Z510 Encounter for antineoplastic radiation therapy: Secondary | ICD-10-CM | POA: Diagnosis not present

## 2015-05-10 ENCOUNTER — Ambulatory Visit
Admission: RE | Admit: 2015-05-10 | Discharge: 2015-05-10 | Disposition: A | Discharge status: Home / Self Care | Payer: Medicare Other | Source: Ambulatory Visit | Attending: Radiation Oncology | Admitting: Radiation Oncology

## 2015-05-10 ENCOUNTER — Telehealth: Payer: Self-pay | Admitting: Endocrinology

## 2015-05-10 DIAGNOSIS — C160 Malignant neoplasm of cardia: Secondary | ICD-10-CM | POA: Diagnosis not present

## 2015-05-10 DIAGNOSIS — N529 Male erectile dysfunction, unspecified: Secondary | ICD-10-CM | POA: Diagnosis not present

## 2015-05-10 DIAGNOSIS — D649 Anemia, unspecified: Secondary | ICD-10-CM | POA: Diagnosis not present

## 2015-05-10 DIAGNOSIS — K219 Gastro-esophageal reflux disease without esophagitis: Secondary | ICD-10-CM | POA: Diagnosis not present

## 2015-05-10 DIAGNOSIS — Z510 Encounter for antineoplastic radiation therapy: Secondary | ICD-10-CM | POA: Diagnosis not present

## 2015-05-10 DIAGNOSIS — J449 Chronic obstructive pulmonary disease, unspecified: Secondary | ICD-10-CM | POA: Diagnosis not present

## 2015-05-10 MED ORDER — INSULIN NPH (HUMAN) (ISOPHANE) 100 UNIT/ML ~~LOC~~ SUSP: 500.0000 [IU] | SUBCUTANEOUS | Status: DC

## 2015-05-10 NOTE — Progress Notes (Signed)
Weekly rad tx gastric 15/28 completd, no nausea, eating smaller meals 6-7 soft foods throughout the day has helped the most, stopped taking carafate stated"It didn't do any good", appetite better , energy level good, no pain.k 9:03 AM There were no vitals taken for this visit.  Wt Readings from Last 3 Encounters:  05/02/15 261 lb (118.389 kg)  04/28/15 261 lb 14.4 oz (118.797 kg)  04/28/15 261 lb (118.389 kg)

## 2015-05-10 NOTE — Telephone Encounter (Signed)
Rx sent per pt's request.  

## 2015-05-10 NOTE — Progress Notes (Signed)
Department of Radiation Oncology  Phone:  [phone] Fax:        [phone]  Weekly Treatment Note    Name: Ronald Johnson Date: 05/10/2015 MRN: [id number] DOB: 1946-01-04   Current dose: 27 Gy  Current fraction:15   MEDICATIONS: Current Outpatient Prescriptions  Medication Sig Dispense Refill  . atorvastatin (LIPITOR) 10 MG tablet Take 1 tablet by mouth  every day 90 tablet 1  . diazepam (VALIUM) 5 MG tablet Take 1 tablet (5 mg total) by mouth 2 (two) times daily as needed. 180 tablet 1  . glucose blood (PRECISION XTRA TEST STRIPS) test strip Check blood sugar three times a day dx 250.01 270 each 3  . hyaluronate sodium (RADIAPLEXRX) GEL Apply 1 application topically daily.    Marland Kitchen HYDROcodone-acetaminophen (NORCO/VICODIN) 5-325 MG per tablet Take 1 tablet by mouth every 6 (six) hours as needed for moderate pain. 60 tablet 0  . insulin NPH Human (NOVOLIN N RELION) 100 UNIT/ML injection Inject 5 mLs (500 Units total) into the skin every morning. And syringes 1/day 500 mL 3  . Insulin Syringe-Needle U-100 30G X 3/8" 1 ML MISC Use to inject insulin 3 times per day. 300 each 2  . losartan-hydrochlorothiazide (HYZAAR) 100-12.5 MG per tablet Take 1 tablet by mouth  daily 90 tablet 2  . mirtazapine (REMERON) 15 MG tablet Take 1 tablet (15 mg total) by mouth at bedtime. 30 tablet 1  . omeprazole (PRILOSEC) 40 MG capsule Take 1 capsule (40 mg total) by mouth daily. 30 capsule 3  . prochlorperazine (COMPAZINE) 10 MG tablet Take 1 tablet (10 mg total) by mouth every 6 (six) hours as needed for nausea. 60 tablet 1  . sildenafil (REVATIO) 20 MG tablet TAKE 1-5 TABLETS BY MOUTH ONCE DAILY AS NEEDED 50 tablet 1  . sucralfate (CARAFATE) 1 G tablet Take 1 tablet (1 g total) by mouth 4 (four) times daily. 120 tablet 2  . dexamethasone (DECADRON) 2 MG tablet Take 5 tablets (10 mg total) by mouth as directed. Take #5 tablets night before 1st chemo and morning of 1st chemo (Patient not taking:  Reported on 05/10/2015) 10 tablet 0  . [DISCONTINUED] metoprolol succinate (TOPROL-XL) 25 MG 24 hr tablet Take 25 mg by mouth daily.      No current facility-administered medications for this encounter.     ALLERGIES: Review of patient's allergies indicates no known allergies.   LABORATORY DATA:  Lab Results  Component Value Date   WBC 3.0* 05/04/2015   HGB 12.5* 05/04/2015   HCT 36.4* 05/04/2015   MCV 91.2 05/04/2015   PLT 135* 05/04/2015   Lab Results  Component Value Date   NA 137 05/04/2015   K 4.4 05/04/2015   CL 103 01/31/2015   CO2 26 05/04/2015   Lab Results  Component Value Date   ALT 14 05/04/2015   AST 15 05/04/2015   ALKPHOS 77 05/04/2015   BILITOT 0.36 05/04/2015     NARRATIVE: Ronald Johnson was seen today for weekly treatment management. The chart was checked and the patient's films were reviewed.  Weekly rad tx gastric 15/28 completd, no nausea, eating smaller meals 6-7 soft foods throughout the day has helped the most, stopped taking carafate stated"It didn't do any good", appetite better , energy level good, no pain.k 9:09 AM There were no vitals taken for this visit.  Wt Readings from Last 3 Encounters:  05/02/15 261 lb (118.389 kg)  04/28/15 261 lb 14.4 oz (118.797 kg)  04/28/15  261 lb (118.389 kg)    PHYSICAL EXAMINATION: vitals were not taken for this visit.     Alert, in no acute distress  ASSESSMENT: The patient is doing satisfactorily with treatment.  PLAN: We will continue with the patient's radiation treatment as planned.

## 2015-05-10 NOTE — Telephone Encounter (Signed)
Patient called stating that he would like a refill on his insulin   Rx: Insulin   Pharmacy: [facility]    Thank you

## 2015-05-11 ENCOUNTER — Ambulatory Visit (HOSPITAL_BASED_OUTPATIENT_CLINIC_OR_DEPARTMENT_OTHER): Payer: Medicare Other | Admitting: Nurse Practitioner

## 2015-05-11 ENCOUNTER — Ambulatory Visit (HOSPITAL_BASED_OUTPATIENT_CLINIC_OR_DEPARTMENT_OTHER): Payer: Medicare Other

## 2015-05-11 ENCOUNTER — Other Ambulatory Visit (HOSPITAL_BASED_OUTPATIENT_CLINIC_OR_DEPARTMENT_OTHER): Payer: Medicare Other

## 2015-05-11 ENCOUNTER — Ambulatory Visit
Admission: RE | Admit: 2015-05-11 | Discharge: 2015-05-11 | Disposition: A | Discharge status: Home / Self Care | Payer: Medicare Other | Source: Ambulatory Visit | Attending: Radiation Oncology | Admitting: Radiation Oncology

## 2015-05-11 VITALS — BP 112/68 | HR 89 | Temp 98.0°F | Resp 19 | Ht 76.0 in | Wt 261.7 lb

## 2015-05-11 DIAGNOSIS — C159 Malignant neoplasm of esophagus, unspecified: Secondary | ICD-10-CM

## 2015-05-11 DIAGNOSIS — C160 Malignant neoplasm of cardia: Secondary | ICD-10-CM

## 2015-05-11 DIAGNOSIS — D649 Anemia, unspecified: Secondary | ICD-10-CM | POA: Diagnosis not present

## 2015-05-11 DIAGNOSIS — J449 Chronic obstructive pulmonary disease, unspecified: Secondary | ICD-10-CM | POA: Diagnosis not present

## 2015-05-11 DIAGNOSIS — Z5111 Encounter for antineoplastic chemotherapy: Secondary | ICD-10-CM | POA: Diagnosis present

## 2015-05-11 DIAGNOSIS — K219 Gastro-esophageal reflux disease without esophagitis: Secondary | ICD-10-CM | POA: Diagnosis not present

## 2015-05-11 DIAGNOSIS — Z510 Encounter for antineoplastic radiation therapy: Secondary | ICD-10-CM | POA: Diagnosis not present

## 2015-05-11 DIAGNOSIS — N529 Male erectile dysfunction, unspecified: Secondary | ICD-10-CM | POA: Diagnosis not present

## 2015-05-11 LAB — CBC WITH DIFFERENTIAL/PLATELET
BASO%: 1.2 % (ref 0.0–2.0)
Basophils Absolute: 0 10*3/uL (ref 0.0–0.1)
EOS%: 2.7 % (ref 0.0–7.0)
Eosinophils Absolute: 0.1 10*3/uL (ref 0.0–0.5)
HCT: 36 % — ABNORMAL LOW (ref 38.4–49.9)
HGB: 12.1 g/dL — ABNORMAL LOW (ref 13.0–17.1)
LYMPH%: 12.2 % — AB (ref 14.0–49.0)
MCH: 31 pg (ref 27.2–33.4)
MCHC: 33.7 g/dL (ref 32.0–36.0)
MCV: 92.2 fL (ref 79.3–98.0)
MONO#: 0.3 10*3/uL (ref 0.1–0.9)
MONO%: 9 % (ref 0.0–14.0)
NEUT%: 74.9 % (ref 39.0–75.0)
NEUTROS ABS: 2.3 10*3/uL (ref 1.5–6.5)
Platelets: 141 10*3/uL (ref 140–400)
RBC: 3.9 10*6/uL — AB (ref 4.20–5.82)
RDW: 12.7 % (ref 11.0–14.6)
WBC: 3.1 10*3/uL — AB (ref 4.0–10.3)
lymph#: 0.4 10*3/uL — ABNORMAL LOW (ref 0.9–3.3)

## 2015-05-11 LAB — BASIC METABOLIC PANEL (CC13)
ANION GAP: 7 meq/L (ref 3–11)
BUN: 24.5 mg/dL (ref 7.0–26.0)
CHLORIDE: 104 meq/L (ref 98–109)
CO2: 29 mEq/L (ref 22–29)
Calcium: 9.1 mg/dL (ref 8.4–10.4)
Creatinine: 1.3 mg/dL (ref 0.7–1.3)
EGFR: 55 mL/min/{1.73_m2} — ABNORMAL LOW (ref >90)
Glucose: 167 mg/dl — ABNORMAL HIGH (ref 70–140)
POTASSIUM: 4.3 meq/L (ref 3.5–5.1)
SODIUM: 139 meq/L (ref 136–145)

## 2015-05-11 MED ORDER — SODIUM CHLORIDE 0.9 % IV SOLN
250.0000 mg | Freq: Once | INTRAVENOUS | Status: AC
  Administered 2015-05-11: 250 mg via INTRAVENOUS
  Filled 2015-05-11: qty 25

## 2015-05-11 MED ORDER — SODIUM CHLORIDE 0.9 % IV SOLN
Freq: Once | INTRAVENOUS | Status: AC
  Administered 2015-05-11: 13:00:00 via INTRAVENOUS

## 2015-05-11 MED ORDER — FAMOTIDINE IN NACL 20-0.9 MG/50ML-% IV SOLN
INTRAVENOUS | Status: AC
  Filled 2015-05-11: qty 50

## 2015-05-11 MED ORDER — DIPHENHYDRAMINE HCL 50 MG/ML IJ SOLN
25.0000 mg | Freq: Once | INTRAMUSCULAR | Status: AC
  Administered 2015-05-11: 25 mg via INTRAVENOUS

## 2015-05-11 MED ORDER — SODIUM CHLORIDE 0.9 % IV SOLN
50.0000 mg/m2 | Freq: Once | INTRAVENOUS | Status: AC
  Administered 2015-05-11: 126 mg via INTRAVENOUS
  Filled 2015-05-11: qty 21

## 2015-05-11 MED ORDER — DIPHENHYDRAMINE HCL 50 MG/ML IJ SOLN
INTRAMUSCULAR | Status: AC
Start: 2015-05-11 — End: 2015-05-11
  Filled 2015-05-11: qty 1

## 2015-05-11 MED ORDER — SODIUM CHLORIDE 0.9 % IV SOLN
Freq: Once | INTRAVENOUS | Status: AC
  Administered 2015-05-11: 13:00:00 via INTRAVENOUS
  Filled 2015-05-11: qty 8

## 2015-05-11 MED ORDER — FAMOTIDINE IN NACL 20-0.9 MG/50ML-% IV SOLN
20.0000 mg | Freq: Once | INTRAVENOUS | Status: AC
Start: 2015-05-11 — End: 2015-05-11
  Administered 2015-05-11: 20 mg via INTRAVENOUS

## 2015-05-11 NOTE — Patient Instructions (Signed)
[facility] Discharge Instructions for Patients Receiving Chemotherapy  Today you received the following chemotherapy agents Taxol and Carboplatin. To help prevent nausea and vomiting after your treatment, we encourage you to take your nausea medication as directed.  If you develop nausea and vomiting that is not controlled by your nausea medication, call the clinic.   BELOW ARE SYMPTOMS THAT SHOULD BE REPORTED IMMEDIATELY:  *FEVER GREATER THAN 100.5 F  *CHILLS WITH OR WITHOUT FEVER  NAUSEA AND VOMITING THAT IS NOT CONTROLLED WITH YOUR NAUSEA MEDICATION  *UNUSUAL SHORTNESS OF BREATH  *UNUSUAL BRUISING OR BLEEDING  TENDERNESS IN MOUTH AND THROAT WITH OR WITHOUT PRESENCE OF ULCERS  *URINARY PROBLEMS  *BOWEL PROBLEMS  UNUSUAL RASH Items with * indicate a potential emergency and should be followed up as soon as possible.  Feel free to call the clinic you have any questions or concerns. The clinic phone number is [phone].  Please show the CHEMO ALERT CARD at check-in to the Emergency Department and triage nurse.    

## 2015-05-11 NOTE — Progress Notes (Signed)
  [place] OFFICE PROGRESS NOTE   Diagnosis:  GE junction carcinoma  INTERVAL HISTORY:   Mr. Bethel returns as scheduled. He continues radiation. He completed week 3 Taxol/carboplatin on 05/04/2015. He denies nausea/vomiting. No mouth sores. No diarrhea. No numbness or tingling in his hands or feet. No rash. Only complaint today is "heartburn". He is taking omeprazole. He tried Carafate for a short period of time but noted no improvement so he discontinued. He is tolerating solids and liquids. Majority of blood sugars have been less than 300.  Objective:  Vital signs in last 24 hours:  Blood pressure 112/68, pulse 89, temperature 98 F (36.7 C), temperature source Oral, resp. rate 19, height 6\' 4"  (1.93 m), weight 261 lb 11.2 oz (118.706 kg).    HEENT: No thrush or ulcers. Resp: Distant breath sounds. Cardio: Regular rate and rhythm. GI: Abdomen soft and nontender. No hepatomegaly. Vascular: No leg edema.   Lab Results:  Lab Results  Component Value Date   WBC 3.1* 05/11/2015   HGB 12.1* 05/11/2015   HCT 36.0* 05/11/2015   MCV 92.2 05/11/2015   PLT 141 05/11/2015   NEUTROABS 2.3 05/11/2015    Imaging:  No results found.  Medications: I have reviewed the patient's current medications.  Assessment/Plan: 1. 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 Postprandial subxiphoid pain secondary to #1  3. Diabetes-elevated blood sugar readings at the [facility] and at home  4. COPD  5. Depression  6. Hypertension  7. History of adenomatous colon polyps  8. History of diabetic related neuropathy, peripheral arterial disease, and nephropathy    Disposition: Mr. Repinski  appears stable. He continues radiation. He has completed 3 weekly treatments with Taxol/carboplatin. Plan to proceed with week 4 today as scheduled.  His main complaint is "heartburn". He will continue omeprazole. I encouraged him to resume Carafate 4 times a day.  He will return for week 5 Taxol/carboplatin on 05/18/2015. He has a follow-up visit with Dr. Servando Snare on 05/25/2015. He will return for a follow-up visit here in approximately 1 month. He will contact the office in the interim with any problems.  Plan reviewed with Dr. [name].    Ned Card ANP/GNP-BC   05/11/2015  11:42 AM

## 2015-05-12 ENCOUNTER — Ambulatory Visit
Admission: RE | Admit: 2015-05-12 | Discharge: 2015-05-12 | Disposition: A | Discharge status: Home / Self Care | Payer: Medicare Other | Source: Ambulatory Visit | Attending: Radiation Oncology | Admitting: Radiation Oncology

## 2015-05-12 ENCOUNTER — Encounter: Payer: Self-pay | Admitting: Radiation Oncology

## 2015-05-12 VITALS — BP 127/71 | HR 83 | Temp 97.7°F | Resp 20 | Wt 262.4 lb

## 2015-05-12 DIAGNOSIS — C160 Malignant neoplasm of cardia: Secondary | ICD-10-CM

## 2015-05-12 DIAGNOSIS — J449 Chronic obstructive pulmonary disease, unspecified: Secondary | ICD-10-CM | POA: Diagnosis not present

## 2015-05-12 DIAGNOSIS — N529 Male erectile dysfunction, unspecified: Secondary | ICD-10-CM | POA: Diagnosis not present

## 2015-05-12 DIAGNOSIS — K219 Gastro-esophageal reflux disease without esophagitis: Secondary | ICD-10-CM | POA: Diagnosis not present

## 2015-05-12 DIAGNOSIS — D649 Anemia, unspecified: Secondary | ICD-10-CM | POA: Diagnosis not present

## 2015-05-12 DIAGNOSIS — Z510 Encounter for antineoplastic radiation therapy: Secondary | ICD-10-CM | POA: Diagnosis not present

## 2015-05-12 NOTE — Progress Notes (Signed)
Weekly rad txs gastric 17 /25completed, no changes from 2 days ago, stated same, saw Ned Card NP yesterday in Med/Onc , eats smaller meals that help, had 3 scambled eggs this am, milk  mild fatigue,  9:12 AM  Wt Readings from Last 3 Encounters:  05/12/15 262 lb 6.4 oz (119.024 kg)  05/11/15 261 lb 11.2 oz (118.706 kg)  05/02/15 261 lb (118.389 kg)  BP 127/71 mmHg  Pulse 83  Temp(Src) 97.7 F (36.5 C) (Oral)  Resp 20  Wt 262 lb 6.4 oz (119.024 kg)

## 2015-05-12 NOTE — Progress Notes (Signed)
  Radiation Oncology         [phone] ________________________________  Name: Ronald Johnson MRN: [id number]  Date: 05/04/2015  DOB: [birth year]  COMPLEX SIMULATION  NOTE  Diagnosis: gE junction cancer  Narrative The patient has initially been planned to receive a course of radiation treatment to a dose of 45 gray in 25 fractions at 1.8 gray per fraction. The patient will now receive a boost to the high risk target volume for an additional 5.4 gray. This will be delivered in 3 fractions at 1.8 gray per fraction and a cone down boost technique will be utilized. To accomplish this, an additional 3 customized blocks have been designed for this purpose. A complex isodose plan is requested to ensure that the high-risk target region receives the appropriate radiation dose and that the nearby normal structures continue to be appropriately spared. The patient's final total dose therefore will be 50.4 gray.   ________________________________ ------------------------------------------------  Jodelle Gross, MD, PhD

## 2015-05-12 NOTE — Progress Notes (Signed)
Department of Radiation Oncology  Phone:  [phone] Fax:        [phone]  Weekly Treatment Note    Name: Ronald Johnson Date: 05/12/2015 MRN: [id number] DOB: 1946-04-05   Current dose: 30.6 Gy  Current fraction:17   MEDICATIONS: Current Outpatient Prescriptions  Medication Sig Dispense Refill  . atorvastatin (LIPITOR) 10 MG tablet Take 1 tablet by mouth  every day 90 tablet 1  . diazepam (VALIUM) 5 MG tablet Take 1 tablet (5 mg total) by mouth 2 (two) times daily as needed. 180 tablet 1  . glucose blood (PRECISION XTRA TEST STRIPS) test strip Check blood sugar three times a day dx 250.01 270 each 3  . hyaluronate sodium (RADIAPLEXRX) GEL Apply 1 application topically daily.    Marland Kitchen HYDROcodone-acetaminophen (NORCO/VICODIN) 5-325 MG per tablet Take 1 tablet by mouth every 6 (six) hours as needed for moderate pain. 60 tablet 0  . insulin NPH Human (NOVOLIN N RELION) 100 UNIT/ML injection Inject 5 mLs (500 Units total) into the skin every morning. And syringes 1/day (Patient taking differently: Inject 300 Units into the skin every morning. And syringes 1/day) 500 mL 3  . Insulin Syringe-Needle U-100 30G X 3/8" 1 ML MISC Use to inject insulin 3 times per day. 300 each 2  . losartan-hydrochlorothiazide (HYZAAR) 100-12.5 MG per tablet Take 1 tablet by mouth  daily 90 tablet 2  . mirtazapine (REMERON) 15 MG tablet Take 1 tablet (15 mg total) by mouth at bedtime. 30 tablet 1  . omeprazole (PRILOSEC) 40 MG capsule Take 1 capsule (40 mg total) by mouth daily. 30 capsule 3  . prochlorperazine (COMPAZINE) 10 MG tablet Take 1 tablet (10 mg total) by mouth every 6 (six) hours as needed for nausea. 60 tablet 1  . sildenafil (REVATIO) 20 MG tablet TAKE 1-5 TABLETS BY MOUTH ONCE DAILY AS NEEDED 50 tablet 1  . sucralfate (CARAFATE) 1 G tablet Take 1 tablet (1 g total) by mouth 4 (four) times daily. 120 tablet 2  . dexamethasone (DECADRON) 2 MG tablet Take 5 tablets (10 mg total) by mouth as  directed. Take #5 tablets night before 1st chemo and morning of 1st chemo (Patient not taking: Reported on 05/10/2015) 10 tablet 0  . [DISCONTINUED] metoprolol succinate (TOPROL-XL) 25 MG 24 hr tablet Take 25 mg by mouth daily.      No current facility-administered medications for this encounter.     ALLERGIES: Review of patient's allergies indicates no known allergies.   LABORATORY DATA:  Lab Results  Component Value Date   WBC 3.1* 05/11/2015   HGB 12.1* 05/11/2015   HCT 36.0* 05/11/2015   MCV 92.2 05/11/2015   PLT 141 05/11/2015   Lab Results  Component Value Date   NA 139 05/11/2015   K 4.3 05/11/2015   CL 103 01/31/2015   CO2 29 05/11/2015   Lab Results  Component Value Date   ALT 14 05/04/2015   AST 15 05/04/2015   ALKPHOS 77 05/04/2015   BILITOT 0.36 05/04/2015     NARRATIVE: Ronald Johnson was seen today for weekly treatment management. The chart was checked and the patient's films were reviewed.  Weekly rad txs gastric 17 /25completed, no changes from 2 days ago, stated same, saw Ned Card NP yesterday in Med/Onc , eats smaller meals that help, had 3 scambled eggs this am, milk  mild fatigue,  9:40 AM  Wt Readings from Last 3 Encounters:  05/12/15 262 lb 6.4 oz (119.024 kg)  05/11/15 261 lb 11.2 oz (118.706 kg)  05/02/15 261 lb (118.389 kg)  BP 127/71 mmHg  Pulse 83  Temp(Src) 97.7 F (36.5 C) (Oral)  Resp 20  Wt 262 lb 6.4 oz (119.024 kg)  PHYSICAL EXAMINATION: weight is 262 lb 6.4 oz (119.024 kg). His oral temperature is 97.7 F (36.5 C). His blood pressure is 127/71 and his pulse is 83. His respiration is 20.        ASSESSMENT: The patient is doing satisfactorily with treatment.  PLAN: We will continue with the patient's radiation treatment as planned.

## 2015-05-14 ENCOUNTER — Other Ambulatory Visit: Payer: Self-pay | Admitting: Oncology

## 2015-05-15 ENCOUNTER — Ambulatory Visit
Admission: RE | Admit: 2015-05-15 | Discharge: 2015-05-15 | Disposition: A | Discharge status: Home / Self Care | Payer: Medicare Other | Source: Ambulatory Visit | Attending: Radiation Oncology | Admitting: Radiation Oncology

## 2015-05-15 DIAGNOSIS — N529 Male erectile dysfunction, unspecified: Secondary | ICD-10-CM | POA: Diagnosis not present

## 2015-05-15 DIAGNOSIS — Z510 Encounter for antineoplastic radiation therapy: Secondary | ICD-10-CM | POA: Diagnosis not present

## 2015-05-15 DIAGNOSIS — C160 Malignant neoplasm of cardia: Secondary | ICD-10-CM | POA: Diagnosis not present

## 2015-05-15 DIAGNOSIS — J449 Chronic obstructive pulmonary disease, unspecified: Secondary | ICD-10-CM | POA: Diagnosis not present

## 2015-05-15 DIAGNOSIS — D649 Anemia, unspecified: Secondary | ICD-10-CM | POA: Diagnosis not present

## 2015-05-15 DIAGNOSIS — K219 Gastro-esophageal reflux disease without esophagitis: Secondary | ICD-10-CM | POA: Diagnosis not present

## 2015-05-16 ENCOUNTER — Ambulatory Visit
Admission: RE | Admit: 2015-05-16 | Discharge: 2015-05-16 | Disposition: A | Discharge status: Home / Self Care | Payer: Medicare Other | Source: Ambulatory Visit | Attending: Radiation Oncology | Admitting: Radiation Oncology

## 2015-05-16 DIAGNOSIS — Z510 Encounter for antineoplastic radiation therapy: Secondary | ICD-10-CM | POA: Diagnosis not present

## 2015-05-16 DIAGNOSIS — N529 Male erectile dysfunction, unspecified: Secondary | ICD-10-CM | POA: Diagnosis not present

## 2015-05-16 DIAGNOSIS — J449 Chronic obstructive pulmonary disease, unspecified: Secondary | ICD-10-CM | POA: Diagnosis not present

## 2015-05-16 DIAGNOSIS — K219 Gastro-esophageal reflux disease without esophagitis: Secondary | ICD-10-CM | POA: Diagnosis not present

## 2015-05-16 DIAGNOSIS — C160 Malignant neoplasm of cardia: Secondary | ICD-10-CM | POA: Diagnosis not present

## 2015-05-16 DIAGNOSIS — D649 Anemia, unspecified: Secondary | ICD-10-CM | POA: Diagnosis not present

## 2015-05-17 ENCOUNTER — Ambulatory Visit
Admission: RE | Admit: 2015-05-17 | Discharge: 2015-05-17 | Disposition: A | Discharge status: Home / Self Care | Payer: Medicare Other | Source: Ambulatory Visit | Attending: Radiation Oncology | Admitting: Radiation Oncology

## 2015-05-17 DIAGNOSIS — D649 Anemia, unspecified: Secondary | ICD-10-CM | POA: Diagnosis not present

## 2015-05-17 DIAGNOSIS — C160 Malignant neoplasm of cardia: Secondary | ICD-10-CM | POA: Diagnosis not present

## 2015-05-17 DIAGNOSIS — J449 Chronic obstructive pulmonary disease, unspecified: Secondary | ICD-10-CM | POA: Diagnosis not present

## 2015-05-17 DIAGNOSIS — Z510 Encounter for antineoplastic radiation therapy: Secondary | ICD-10-CM | POA: Diagnosis not present

## 2015-05-17 DIAGNOSIS — N529 Male erectile dysfunction, unspecified: Secondary | ICD-10-CM | POA: Diagnosis not present

## 2015-05-17 DIAGNOSIS — K219 Gastro-esophageal reflux disease without esophagitis: Secondary | ICD-10-CM | POA: Diagnosis not present

## 2015-05-18 ENCOUNTER — Other Ambulatory Visit (HOSPITAL_BASED_OUTPATIENT_CLINIC_OR_DEPARTMENT_OTHER): Payer: Medicare Other

## 2015-05-18 ENCOUNTER — Ambulatory Visit
Admission: RE | Admit: 2015-05-18 | Discharge: 2015-05-18 | Disposition: A | Discharge status: Home / Self Care | Payer: Medicare Other | Source: Ambulatory Visit | Attending: Radiation Oncology | Admitting: Radiation Oncology

## 2015-05-18 ENCOUNTER — Ambulatory Visit (HOSPITAL_BASED_OUTPATIENT_CLINIC_OR_DEPARTMENT_OTHER): Payer: Medicare Other

## 2015-05-18 ENCOUNTER — Ambulatory Visit: Payer: Medicare Other | Admitting: Nutrition

## 2015-05-18 VITALS — BP 131/66 | HR 79 | Temp 97.9°F | Resp 20

## 2015-05-18 DIAGNOSIS — K219 Gastro-esophageal reflux disease without esophagitis: Secondary | ICD-10-CM | POA: Diagnosis not present

## 2015-05-18 DIAGNOSIS — D649 Anemia, unspecified: Secondary | ICD-10-CM | POA: Diagnosis not present

## 2015-05-18 DIAGNOSIS — J449 Chronic obstructive pulmonary disease, unspecified: Secondary | ICD-10-CM | POA: Diagnosis not present

## 2015-05-18 DIAGNOSIS — C160 Malignant neoplasm of cardia: Secondary | ICD-10-CM

## 2015-05-18 DIAGNOSIS — Z5111 Encounter for antineoplastic chemotherapy: Secondary | ICD-10-CM | POA: Diagnosis present

## 2015-05-18 DIAGNOSIS — Z510 Encounter for antineoplastic radiation therapy: Secondary | ICD-10-CM | POA: Diagnosis not present

## 2015-05-18 DIAGNOSIS — N529 Male erectile dysfunction, unspecified: Secondary | ICD-10-CM | POA: Diagnosis not present

## 2015-05-18 LAB — BASIC METABOLIC PANEL (CC13)
Anion Gap: 8 mEq/L (ref 3–11)
BUN: 21.9 mg/dL (ref 7.0–26.0)
CALCIUM: 9.7 mg/dL (ref 8.4–10.4)
CO2: 29 meq/L (ref 22–29)
Chloride: 100 mEq/L (ref 98–109)
Creatinine: 1.2 mg/dL (ref 0.7–1.3)
EGFR: 62 mL/min/{1.73_m2} — AB (ref >90)
GLUCOSE: 200 mg/dL — AB (ref 70–140)
Potassium: 4.1 mEq/L (ref 3.5–5.1)
Sodium: 137 mEq/L (ref 136–145)

## 2015-05-18 LAB — CBC WITH DIFFERENTIAL/PLATELET
BASO%: 0.9 % (ref 0.0–2.0)
BASOS ABS: 0 10*3/uL (ref 0.0–0.1)
EOS ABS: 0.1 10*3/uL (ref 0.0–0.5)
EOS%: 2.6 % (ref 0.0–7.0)
HEMATOCRIT: 36.5 % — AB (ref 38.4–49.9)
HEMOGLOBIN: 12.4 g/dL — AB (ref 13.0–17.1)
LYMPH#: 0.4 10*3/uL — AB (ref 0.9–3.3)
LYMPH%: 12 % — ABNORMAL LOW (ref 14.0–49.0)
MCH: 31 pg (ref 27.2–33.4)
MCHC: 34.1 g/dL (ref 32.0–36.0)
MCV: 91.1 fL (ref 79.3–98.0)
MONO#: 0.3 10*3/uL (ref 0.1–0.9)
MONO%: 9.4 % (ref 0.0–14.0)
NEUT#: 2.5 10*3/uL (ref 1.5–6.5)
NEUT%: 75.1 % — ABNORMAL HIGH (ref 39.0–75.0)
Platelets: 124 10*3/uL — ABNORMAL LOW (ref 140–400)
RBC: 4 10*6/uL — ABNORMAL LOW (ref 4.20–5.82)
RDW: 13.1 % (ref 11.0–14.6)
WBC: 3.4 10*3/uL — ABNORMAL LOW (ref 4.0–10.3)

## 2015-05-18 MED ORDER — DIPHENHYDRAMINE HCL 50 MG/ML IJ SOLN
INTRAMUSCULAR | Status: AC
  Filled 2015-05-18: qty 1

## 2015-05-18 MED ORDER — SODIUM CHLORIDE 0.9 % IV SOLN
230.0000 mg | Freq: Once | INTRAVENOUS | Status: AC
  Administered 2015-05-18: 230 mg via INTRAVENOUS
  Filled 2015-05-18: qty 23

## 2015-05-18 MED ORDER — SODIUM CHLORIDE 0.9 % IJ SOLN
10.0000 mL | INTRAMUSCULAR | Status: DC | PRN
  Filled 2015-05-18: qty 10

## 2015-05-18 MED ORDER — HEPARIN SOD (PORK) LOCK FLUSH 100 UNIT/ML IV SOLN
500.0000 [IU] | Freq: Once | INTRAVENOUS | Status: DC | PRN
  Filled 2015-05-18: qty 5

## 2015-05-18 MED ORDER — SODIUM CHLORIDE 0.9 % IV SOLN
Freq: Once | INTRAVENOUS | Status: AC
  Administered 2015-05-18: 12:00:00 via INTRAVENOUS
  Filled 2015-05-18: qty 8

## 2015-05-18 MED ORDER — SODIUM CHLORIDE 0.9 % IV SOLN
Freq: Once | INTRAVENOUS | Status: AC
  Administered 2015-05-18: 11:00:00 via INTRAVENOUS

## 2015-05-18 MED ORDER — FAMOTIDINE IN NACL 20-0.9 MG/50ML-% IV SOLN
INTRAVENOUS | Status: AC
  Filled 2015-05-18: qty 50

## 2015-05-18 MED ORDER — FAMOTIDINE IN NACL 20-0.9 MG/50ML-% IV SOLN
20.0000 mg | Freq: Once | INTRAVENOUS | Status: AC
  Administered 2015-05-18: 20 mg via INTRAVENOUS

## 2015-05-18 MED ORDER — DIPHENHYDRAMINE HCL 50 MG/ML IJ SOLN
25.0000 mg | Freq: Once | INTRAMUSCULAR | Status: AC
  Administered 2015-05-18: 25 mg via INTRAVENOUS

## 2015-05-18 MED ORDER — SODIUM CHLORIDE 0.9 % IV SOLN
50.0000 mg/m2 | Freq: Once | INTRAVENOUS | Status: AC
  Administered 2015-05-18: 126 mg via INTRAVENOUS
  Filled 2015-05-18: qty 21

## 2015-05-18 NOTE — Progress Notes (Signed)
Nutrition follow-up completed with patient being treated for esophageal cancer. Patient reports a good appetite but has increased pain with swallowing. Patient requires liquids or very soft foods. Patient has started to drink ensure or boost. Weight documented as 262.4 pounds on September 23 decreased slightly from 265.9 pounds August 15.  Estimated nutrition needs: 2400-2600 calories, 135-150 grams protein, 2.6 L fluid.  Nutrition diagnosis: Predicted suboptimal energy intake continues.  Intervention:  Patient educated to blenderize soft foods for increased intake. Recommended patient consume Ensure Plus or boost +3-4 bottles daily. Provided additional coupons for oral nutrition supplements. Questions answered.  Teach back method used.  Monitoring, evaluation, goals: Patient will tolerate adequate calories and protein to minimize weight loss.  Next visit: I will continue to follow patient as needed.  **Disclaimer: This note was dictated with voice recognition software. Similar sounding words can inadvertently be transcribed and this note may contain transcription errors which may not have been corrected upon publication of note.**

## 2015-05-19 ENCOUNTER — Ambulatory Visit
Admission: RE | Admit: 2015-05-19 | Discharge: 2015-05-19 | Disposition: A | Discharge status: Home / Self Care | Payer: Medicare Other | Source: Ambulatory Visit | Attending: Radiation Oncology | Admitting: Radiation Oncology

## 2015-05-19 ENCOUNTER — Encounter: Payer: Self-pay | Admitting: Radiation Oncology

## 2015-05-19 VITALS — BP 101/64 | HR 103 | Temp 97.6°F | Resp 20 | Wt 258.6 lb

## 2015-05-19 DIAGNOSIS — C160 Malignant neoplasm of cardia: Secondary | ICD-10-CM | POA: Diagnosis not present

## 2015-05-19 DIAGNOSIS — K219 Gastro-esophageal reflux disease without esophagitis: Secondary | ICD-10-CM | POA: Diagnosis not present

## 2015-05-19 DIAGNOSIS — N529 Male erectile dysfunction, unspecified: Secondary | ICD-10-CM | POA: Diagnosis not present

## 2015-05-19 DIAGNOSIS — D649 Anemia, unspecified: Secondary | ICD-10-CM | POA: Diagnosis not present

## 2015-05-19 DIAGNOSIS — J449 Chronic obstructive pulmonary disease, unspecified: Secondary | ICD-10-CM | POA: Diagnosis not present

## 2015-05-19 DIAGNOSIS — Z510 Encounter for antineoplastic radiation therapy: Secondary | ICD-10-CM | POA: Diagnosis not present

## 2015-05-19 NOTE — Addendum Note (Signed)
Encounter addended by: Kyung Rudd, MD on: 05/19/2015  9:48 AM<BR>     Documentation filed: Notes Section

## 2015-05-19 NOTE — Progress Notes (Addendum)
Weekly rad txs gastric 22//28 completed, c/o nausea , ate toast and boost, pain chest/abdomen 7/10 scale, hasn't taken any painmediation as yet, l;ast Chemotherpay yesterday  Completed, fatigued,  Labs 05/18/15,   Offered gingerale and moist cool cloth to neck,pt declined will take comapzine and pain med when gets home toay 9:17 AM Wt Readings from Last 3 Encounters:  05/19/15 258 lb 9.6 oz (117.3 kg)  05/12/15 262 lb 6.4 oz (119.024 kg)  05/11/15 261 lb 11.2 oz (118.706 kg)   BP 101/64 mmHg  Pulse 103  Temp(Src) 97.6 F (36.4 C) (Oral)  Resp 20  Wt 258 lb 9.6 oz (117.3 kg)  SpO2 100%

## 2015-05-19 NOTE — Progress Notes (Addendum)
Department of Radiation Oncology  Phone:  [phone] Fax:        [phone]  Weekly Treatment Note    Name: Ronald Johnson Date: 05/19/2015 MRN: [id number] DOB: 1946/03/14   Current dose: 39.6 Gy  Current fraction:22   MEDICATIONS: Current Outpatient Prescriptions  Medication Sig Dispense Refill  . atorvastatin (LIPITOR) 10 MG tablet Take 1 tablet by mouth  every day 90 tablet 1  . dexamethasone (DECADRON) 2 MG tablet Take 5 tablets (10 mg total) by mouth as directed. Take #5 tablets night before 1st chemo and morning of 1st chemo 10 tablet 0  . diazepam (VALIUM) 5 MG tablet Take 1 tablet (5 mg total) by mouth 2 (two) times daily as needed. 180 tablet 1  . glucose blood (PRECISION XTRA TEST STRIPS) test strip Check blood sugar three times a day dx 250.01 270 each 3  . hyaluronate sodium (RADIAPLEXRX) GEL Apply 1 application topically daily.    Marland Kitchen HYDROcodone-acetaminophen (NORCO/VICODIN) 5-325 MG per tablet Take 1 tablet by mouth every 6 (six) hours as needed for moderate pain. 60 tablet 0  . insulin NPH Human (NOVOLIN N RELION) 100 UNIT/ML injection Inject 5 mLs (500 Units total) into the skin every morning. And syringes 1/day (Patient taking differently: Inject 300 Units into the skin every morning. And syringes 1/day) 500 mL 3  . Insulin Syringe-Needle U-100 30G X 3/8" 1 ML MISC Use to inject insulin 3 times per day. 300 each 2  . losartan-hydrochlorothiazide (HYZAAR) 100-12.5 MG per tablet Take 1 tablet by mouth  daily 90 tablet 2  . mirtazapine (REMERON) 15 MG tablet Take 1 tablet (15 mg total) by mouth at bedtime. 30 tablet 1  . omeprazole (PRILOSEC) 40 MG capsule Take 1 capsule (40 mg total) by mouth daily. 30 capsule 3  . prochlorperazine (COMPAZINE) 10 MG tablet Take 1 tablet (10 mg total) by mouth every 6 (six) hours as needed for nausea. 60 tablet 1  . sildenafil (REVATIO) 20 MG tablet TAKE 1-5 TABLETS BY MOUTH ONCE DAILY AS NEEDED 50 tablet 1  . sucralfate  (CARAFATE) 1 G tablet Take 1 tablet (1 g total) by mouth 4 (four) times daily. 120 tablet 2  . [DISCONTINUED] metoprolol succinate (TOPROL-XL) 25 MG 24 hr tablet Take 25 mg by mouth daily.      No current facility-administered medications for this encounter.     ALLERGIES: Review of patient's allergies indicates no known allergies.   LABORATORY DATA:  Lab Results  Component Value Date   WBC 3.4* 05/18/2015   HGB 12.4* 05/18/2015   HCT 36.5* 05/18/2015   MCV 91.1 05/18/2015   PLT 124* 05/18/2015   Lab Results  Component Value Date   NA 137 05/18/2015   K 4.1 05/18/2015   CL 103 01/31/2015   CO2 29 05/18/2015   Lab Results  Component Value Date   ALT 14 05/04/2015   AST 15 05/04/2015   ALKPHOS 77 05/04/2015   BILITOT 0.36 05/04/2015     NARRATIVE: [name] was seen today for weekly treatment management. The chart was checked and the patient's films were reviewed.  Weekly rad txs gastric 22//28 completed, c/o nausea , ate toast and boost, pain chest/abdomen 7/10 scale, hasn't taken any painmediation as yet, l;ast Chemotherpay yesterday  Completed, fatigued,  Labs 05/18/15,   Offered gingerale and moist cool cloth to neck,pt declined will take comapzine and pain med when gets home toay 9:43 AM Wt Readings from Last 3 Encounters:  05/19/15 258 lb 9.6 oz (117.3 kg)  05/12/15 262 lb 6.4 oz (119.024 kg)  05/11/15 261 lb 11.2 oz (118.706 kg)   BP 101/64 mmHg  Pulse 103  Temp(Src) 97.6 F (36.4 C) (Oral)  Resp 20  Wt 258 lb 9.6 oz (117.3 kg)  SpO2 100%  PHYSICAL EXAMINATION: weight is 258 lb 9.6 oz (117.3 kg). His oral temperature is 97.6 F (36.4 C). His blood pressure is 101/64 and his pulse is 103. His respiration is 20 and oxygen saturation is 100%.        ASSESSMENT: The patient is doing satisfactorily with treatment.  PLAN: We will continue with the patient's radiation treatment as planned. I encouraged the patient to take nausea medicine which is available  which has been a significant issue for him over the last day. He was also encouraged to drink plenty of fluids.

## 2015-05-22 ENCOUNTER — Ambulatory Visit
Admission: RE | Admit: 2015-05-22 | Discharge: 2015-05-22 | Disposition: A | Discharge status: Home / Self Care | Payer: Medicare Other | Source: Ambulatory Visit | Attending: Radiation Oncology | Admitting: Radiation Oncology

## 2015-05-22 DIAGNOSIS — C160 Malignant neoplasm of cardia: Secondary | ICD-10-CM | POA: Diagnosis not present

## 2015-05-22 DIAGNOSIS — Z510 Encounter for antineoplastic radiation therapy: Secondary | ICD-10-CM | POA: Diagnosis not present

## 2015-05-22 DIAGNOSIS — E109 Type 1 diabetes mellitus without complications: Secondary | ICD-10-CM | POA: Diagnosis not present

## 2015-05-22 DIAGNOSIS — N529 Male erectile dysfunction, unspecified: Secondary | ICD-10-CM | POA: Diagnosis not present

## 2015-05-22 DIAGNOSIS — R911 Solitary pulmonary nodule: Secondary | ICD-10-CM | POA: Diagnosis not present

## 2015-05-22 DIAGNOSIS — K219 Gastro-esophageal reflux disease without esophagitis: Secondary | ICD-10-CM | POA: Diagnosis not present

## 2015-05-22 DIAGNOSIS — I10 Essential (primary) hypertension: Secondary | ICD-10-CM | POA: Diagnosis not present

## 2015-05-22 DIAGNOSIS — F329 Major depressive disorder, single episode, unspecified: Secondary | ICD-10-CM | POA: Diagnosis not present

## 2015-05-22 DIAGNOSIS — D649 Anemia, unspecified: Secondary | ICD-10-CM | POA: Diagnosis not present

## 2015-05-22 DIAGNOSIS — E7800 Pure hypercholesterolemia, unspecified: Secondary | ICD-10-CM | POA: Diagnosis not present

## 2015-05-22 DIAGNOSIS — J449 Chronic obstructive pulmonary disease, unspecified: Secondary | ICD-10-CM | POA: Diagnosis not present

## 2015-05-23 ENCOUNTER — Ambulatory Visit
Admission: RE | Admit: 2015-05-23 | Discharge: 2015-05-23 | Disposition: A | Discharge status: Home / Self Care | Payer: Medicare Other | Source: Ambulatory Visit | Attending: Radiation Oncology | Admitting: Radiation Oncology

## 2015-05-23 DIAGNOSIS — C160 Malignant neoplasm of cardia: Secondary | ICD-10-CM | POA: Diagnosis not present

## 2015-05-23 DIAGNOSIS — N529 Male erectile dysfunction, unspecified: Secondary | ICD-10-CM | POA: Diagnosis not present

## 2015-05-23 DIAGNOSIS — D649 Anemia, unspecified: Secondary | ICD-10-CM | POA: Diagnosis not present

## 2015-05-23 DIAGNOSIS — Z510 Encounter for antineoplastic radiation therapy: Secondary | ICD-10-CM | POA: Diagnosis not present

## 2015-05-23 DIAGNOSIS — K219 Gastro-esophageal reflux disease without esophagitis: Secondary | ICD-10-CM | POA: Diagnosis not present

## 2015-05-23 DIAGNOSIS — J449 Chronic obstructive pulmonary disease, unspecified: Secondary | ICD-10-CM | POA: Diagnosis not present

## 2015-05-24 ENCOUNTER — Ambulatory Visit
Admission: RE | Admit: 2015-05-24 | Discharge: 2015-05-24 | Disposition: A | Discharge status: Home / Self Care | Payer: Medicare Other | Source: Ambulatory Visit | Attending: Radiation Oncology | Admitting: Radiation Oncology

## 2015-05-24 ENCOUNTER — Encounter: Payer: Self-pay | Admitting: Radiation Oncology

## 2015-05-24 DIAGNOSIS — K219 Gastro-esophageal reflux disease without esophagitis: Secondary | ICD-10-CM | POA: Diagnosis not present

## 2015-05-24 DIAGNOSIS — D649 Anemia, unspecified: Secondary | ICD-10-CM | POA: Diagnosis not present

## 2015-05-24 DIAGNOSIS — Z510 Encounter for antineoplastic radiation therapy: Secondary | ICD-10-CM | POA: Diagnosis not present

## 2015-05-24 DIAGNOSIS — C160 Malignant neoplasm of cardia: Secondary | ICD-10-CM | POA: Diagnosis not present

## 2015-05-24 DIAGNOSIS — J449 Chronic obstructive pulmonary disease, unspecified: Secondary | ICD-10-CM | POA: Diagnosis not present

## 2015-05-24 DIAGNOSIS — N529 Male erectile dysfunction, unspecified: Secondary | ICD-10-CM | POA: Diagnosis not present

## 2015-05-24 NOTE — Progress Notes (Signed)
Completed FMLA paperwork faxed to [employer]. Attention Sheryle Hail at [phone]

## 2015-05-25 ENCOUNTER — Ambulatory Visit
Admission: RE | Admit: 2015-05-25 | Discharge: 2015-05-25 | Disposition: A | Discharge status: Home / Self Care | Payer: Medicare Other | Source: Ambulatory Visit | Attending: Radiation Oncology | Admitting: Radiation Oncology

## 2015-05-25 ENCOUNTER — Ambulatory Visit (INDEPENDENT_AMBULATORY_CARE_PROVIDER_SITE_OTHER): Payer: Medicare Other | Admitting: Cardiothoracic Surgery

## 2015-05-25 ENCOUNTER — Encounter: Payer: Self-pay | Admitting: Cardiothoracic Surgery

## 2015-05-25 VITALS — BP 117/77 | HR 90 | Resp 20 | Ht 76.0 in | Wt 258.0 lb

## 2015-05-25 DIAGNOSIS — C160 Malignant neoplasm of cardia: Secondary | ICD-10-CM | POA: Diagnosis not present

## 2015-05-25 DIAGNOSIS — Z510 Encounter for antineoplastic radiation therapy: Secondary | ICD-10-CM | POA: Diagnosis not present

## 2015-05-25 DIAGNOSIS — C159 Malignant neoplasm of esophagus, unspecified: Secondary | ICD-10-CM | POA: Diagnosis not present

## 2015-05-25 DIAGNOSIS — K219 Gastro-esophageal reflux disease without esophagitis: Secondary | ICD-10-CM | POA: Diagnosis not present

## 2015-05-25 DIAGNOSIS — N529 Male erectile dysfunction, unspecified: Secondary | ICD-10-CM | POA: Diagnosis not present

## 2015-05-25 DIAGNOSIS — D649 Anemia, unspecified: Secondary | ICD-10-CM | POA: Diagnosis not present

## 2015-05-25 DIAGNOSIS — J449 Chronic obstructive pulmonary disease, unspecified: Secondary | ICD-10-CM | POA: Diagnosis not present

## 2015-05-25 NOTE — Progress Notes (Signed)
[address]       [place] [address]             [phone]                    [place] C [facility] Medical Record #[id number] Date of Birth: 09-23-1945  Referring: Milus Banister, MD Primary Care: [name], MD  Chief Complaint:    Chief Complaint  Patient presents with  . Esophageal Cancer    Further discuss surgery, PET Scan 04/12/2015    History of Present Illness:    Ronald Johnson 69 y.o. 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 The patient has a history of [name]'s esophagus dating back to biopsies in 2007. In the spring of this year he had begun having epigastric discomfort when eating and some mild trouble swallowing. A cardiac stress test was performed which was negative for ischemia endoscopy and ultimately endoscopic ultrasound were performed in late July, confirming adenocarcinoma the esophagus. The patient notes no weight loss in fact has gained approximately 15 pounds over the past 6 months. He had no blood in his stool until after the endoscopy.  The patient has known underlying pulmonary disease, he stopped smoking 6 years ago. 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  He is currently being treated with radiation to be completed October 10  He completed week 5 Taxol/carboplatin on September 29   Previous screening PFTs revealed an FEV1 of 35% of predicted. Repeat pft's FEV1 2.27 54%  DLCO 20.54 50%   Wt Readings from Last 3 Encounters:  05/25/15 258 lb (117.028 kg)  05/19/15 258 lb 9.6 oz (117.3 kg)  05/12/15 262 lb 6.4 oz (119.024 kg)   Current Activity/ Functional Status:  Patient is independent with mobility/ambulation, transfers, ADL's, IADL's.   Zubrod Score: At the time of surgery this patient's most appropriate activity status/level should be described as: [x]     0    Normal activity, no symptoms []    1    Restricted in physical strenuous activity but ambulatory, able to do out light work []    2    Ambulatory and capable of self care, unable to do work activities, up and about               >50 % of waking hours                              []    3    Only limited self care, in bed greater than 50% of waking hours []    4    Completely disabled, no self care, confined to bed or chair []    5    Moribund   Past Medical History  Diagnosis Date  . COLONIC POLYPS, ADENOMATOUS 08/01/2008    09/01/2012 also  . DIABETES MELLITUS, TYPE I 03/13/2007  . HYPERCHOLESTEROLEMIA 02/03/2008  . ANEMIA 08/15/2009  . Anxiety state, unspecified 12/15/2008  . DEPRESSION 03/13/2007  . HYPERTENSION 03/13/2007  . PULMONARY NODULE 11/24/2008  . GERD 03/13/2007  . TOBACCO ABUSE 11/24/2008  . EMPHYSEMA 11/08/2008  . Prostatism   . ED (erectile dysfunction)   . [name]'s esophagus   . Hiatal hernia   .  Diverticulitis   . Arthritis   . COPD (chronic obstructive pulmonary disease) [facility])     Past Surgical History  Procedure Laterality Date  . Egd  11/19/2005  . Foot fracture surgery Right 1980    right foot w/ pins and screws  . Removal pins and screws foot  1980    right foot  . Upper gastrointestinal endoscopy    . Circumcision    . Colonoscopy    . Polypectomy    . Vasectomy    . Eus N/A 03/16/2015    Procedure: UPPER ENDOSCOPIC ULTRASOUND (EUS) RADIAL;  Surgeon: Milus Banister, MD;  Location: [facility] ENDOSCOPY;  Service: Endoscopy;  Laterality: N/A;    Family History  Problem Relation Age of Onset  . Stroke Mother   . Diabetes Mother   . Colon cancer Neg Hx   . Diabetes Maternal Grandmother     mother side of the family aunts, MGF  . Breast cancer Paternal Grandmother     Social History   Social History  . Marital Status: Married    Spouse Name: N/A  . Number of Children: 2  . Years of Education: N/A   Occupational History  . INSPECTOR     works  Youth worker   Social History Main Topics  . Smoking status: Former Smoker -- 1.00 packs/day for 35 years    Types: Cigarettes    Quit date: 10/31/2008  . Smokeless tobacco: Never Used  . Alcohol Use: 0.0 oz/week    0 Standard drinks or equivalent per week     Comment: 2/month only on vacation  . Drug Use: No  . Sexual Activity: No   Other Topics Concern  . Not on file   Social History Narrative   Married, wife [place] with #2 grown children   Prior [employer], where he met his wife in [place]   Retired-prior work Personnel officer ADL's    History  Smoking status  . Former Smoker -- 1.00 packs/day for 35 years  . Types: Cigarettes  . Quit date: 10/31/2008  Smokeless tobacco  . Never Used    History  Alcohol Use  . 0.0 oz/week  . 0 Standard drinks or equivalent per week    Comment: 2/month only on vacation     No Known Allergies  Current Outpatient Prescriptions  Medication Sig Dispense Refill  . atorvastatin (LIPITOR) 10 MG tablet Take 1 tablet by mouth  every day 90 tablet 1  . diazepam (VALIUM) 5 MG tablet Take 1 tablet (5 mg total) by mouth 2 (two) times daily as needed. 180 tablet 1  . glucose blood (PRECISION XTRA TEST STRIPS) test strip Check blood sugar three times a day dx 250.01 270 each 3  . HYDROcodone-acetaminophen (NORCO/VICODIN) 5-325 MG per tablet Take 1 tablet by mouth every 6 (six) hours as needed for moderate pain. 60 tablet 0  . insulin NPH Human (NOVOLIN N RELION) 100 UNIT/ML injection Inject 5 mLs (500 Units total) into the skin every morning. And syringes 1/day (Patient taking differently: Inject 300 Units into the skin every morning. And syringes 1/day) 500 mL 3  . Insulin Syringe-Needle U-100 30G X 3/8" 1 ML MISC Use to inject insulin 3 times per day. 300 each 2  . losartan-hydrochlorothiazide (HYZAAR) 100-12.5 MG per tablet Take 1 tablet by mouth  daily 90 tablet 2  . mirtazapine (REMERON) 15 MG tablet Take 1 tablet (15 mg  total) by mouth at bedtime. 30 tablet 1  .  omeprazole (PRILOSEC) 40 MG capsule Take 1 capsule (40 mg total) by mouth daily. 30 capsule 3  . prochlorperazine (COMPAZINE) 10 MG tablet Take 1 tablet (10 mg total) by mouth every 6 (six) hours as needed for nausea. 60 tablet 1  . sildenafil (REVATIO) 20 MG tablet TAKE 1-5 TABLETS BY MOUTH ONCE DAILY AS NEEDED 50 tablet 1  . sucralfate (CARAFATE) 1 G tablet Take 1 tablet (1 g total) by mouth 4 (four) times daily. 120 tablet 2  . hyaluronate sodium (RADIAPLEXRX) GEL Apply 1 application topically daily.    . [DISCONTINUED] metoprolol succinate (TOPROL-XL) 25 MG 24 hr tablet Take 25 mg by mouth daily.      No current facility-administered medications for this visit.      Review of Systems:     Cardiac Review of Systems: Y or N  Chest Pain [   y ]  Resting SOB [ n  ] Exertional SOB  Blue.Reese  ]  Orthopnea [  ]   Pedal Edema [   ]    Palpitations [  ] Syncope  [  ]   Presyncope [   ]  General Review of Systems: [Y] = yes [  ]=no Constitional: recent weight change [ y ];  Wt loss over the last 3 months [   ] anorexia [  ]; fatigue [ y ]; nausea [  ]; night sweats [  ]; fever [  ]; or chills [  ];          Dental: poor dentition[  ]; Last Dentist visit:   Eye : blurred vision [  ]; diplopia [   ]; vision changes [  ];  Amaurosis fugax[  ]; Resp: cough [n  ];  wheezing[  ];  hemoptysis[  ]; shortness of breath[  ]; paroxysmal nocturnal dyspnea[  ]; dyspnea on exertion[  ]; or orthopnea[  ];  GI:  gallstones[  ], vomiting[  ];  dysphagia[  ]; melena[  ];  hematochezia [  ]; heartburn[  ];   Hx of  Colonoscopy[  ]; GU: kidney stones [  ]; hematuria[  ];   dysuria [  ];  nocturia[  ];  history of     obstruction [  ]; urinary frequency [  ]             Skin: rash, swelling[  ];, hair loss[  ];  peripheral edema[  ];  or itching[  ]; Musculosketetal: myalgias[  ];  joint swelling[  ];  joint erythema[  ];  joint pain[  ];  back pain[  ];  Heme/Lymph: bruising[   ];  bleeding[  ];  anemia[  ];  Neuro: TIA[  ];  headaches[  ];  stroke[  ];  vertigo[  ];  seizures[  ];   paresthesias[  ];  difficulty walking[  ];  Psych:depression[  ]; anxiety[  ];  Endocrine: diabetes[y  ];  thyroid dysfunction[  ];  Immunizations: Flu up to date [  ]; Pneumococcal up to date [  ];  Other:  Physical Exam: BP 117/77 mmHg  Pulse 90  Resp 20  Ht 6' 4" (1.93 m)  Wt 258 lb (117.028 kg)  BMI 31.42 kg/m2  SpO2 96%  PHYSICAL EXAMINATION: General appearance: alert, cooperative, appears stated age and no distress Head: Normocephalic, without obvious abnormality, atraumatic Neck: no adenopathy, no carotid bruit, no JVD, supple, symmetrical, trachea midline and thyroid not enlarged, symmetric, no tenderness/mass/nodules Lymph nodes:  Cervical, supraclavicular, and axillary nodes normal. Resp: clear to auscultation bilaterally Back: symmetric, no curvature. ROM normal. No CVA tenderness. Cardio: regular rate and rhythm, S1, S2 normal, no murmur, click, rub or gallop GI: soft, non-tender; bowel sounds normal; no masses,  no organomegaly Extremities: extremities normal, atraumatic, no cyanosis or edema and Homans sign is negative, no sign of DVT Neurologic: Grossly normal Patient has full DP and PT pulses bilaterally    Diagnostic Studies & Laboratory data:     Recent Radiology Findings:   No results found.  PET scan pending  I have independently reviewed the above radiologic studies.  Recent Lab Findings: Lab Results  Component Value Date   WBC 3.4* 05/18/2015   HGB 12.4* 05/18/2015   HCT 36.5* 05/18/2015   PLT 124* 05/18/2015   GLUCOSE 200* 05/18/2015   CHOL 170 12/02/2014   TRIG * 12/02/2014    449.0 Triglyceride is over 400; calculations on Lipids are invalid.   HDL 32.40* 12/02/2014   LDLDIRECT 93.0 12/02/2014   LDLCALC 94 06/17/2014   ALT 14 05/04/2015   AST 15 05/04/2015   NA 137 05/18/2015   K 4.1 05/18/2015   CL 103 01/31/2015   CREATININE 1.2  05/18/2015   BUN 21.9 05/18/2015   CO2 29 05/18/2015   TSH 2.50 12/02/2014   INR 1.16 09/10/2011   HGBA1C 8 0 03/03/2015   EUS: Endoscopic findings: 1. The GE junction was nodular, ulcerated and friable. There was a mild stricture that allowed passage of the larger diameter radial echoendoscope with only mild resistance. 2. The upper GI tract was otherwise normal EUS findings: 1. There was obvious hypoechoic, non-circumferential, eccentric mass at the GE junction. This was approximately 4 cm long and the bulk of the mass lay on the gastric side of the GE junction. The mass was 1.2 cm thick maximally. It was 3.7 cm across. The mass clearly invades into and through muscularis propria (uT3). 2. There was a small but suspicious paraesophageal lymph node located 2 cm proximal to the mass this was 6.4 mm in diameter, round, hypoechoic, discrete (uN1). ENDOSCOPIC IMPRESSION: 4cm long, 3.7cm across eccentric (non-circumferential) uT3N1 (Stage IIIA) GE junction adenocarinoma. The bulk of the tumor lays on the gastric side of the GE junction.  PATH: Patient: Ronald Johnson, Ronald Johnson Collected: 03/08/2015 Client: [facility] Accession: [id number] Diagnosis Surgical [P], distal esophagus - ADENOCARCINOMA, SEE COMMENT. Microscopic Comment The adenocarcinoma involves at least lamina propria and is arising in the background of intestinal metaplasia associated high grade glandular dysplasia. The case is reviewed with Dr Lyndon Code who concurs. The case was discussed with Dr. Henrene Pastor on 03/13/15. (CRR:gt, 03/13/15) [place] RUND DO   2007: 1. ESOPHAGUS, BIOPSY: ESOPHAGEAL MUCOSA WITH INTESTINAL METAPLASIA CONSISTENT WITH [name]' S MUCOSA AND FOCAL GLANDULAR ATYPIA CONSISTENT WITH LOW GRADE GLANDULAR DYSPLASIA. NO HIGH GRADE DYSPLASIA OR MALIGNANCY IDENTIFIED.  2013: 5. Surgical [P], esophagus, bx - SQUAMOCOLUMNAR MUCOSA WITH SQUAMOUS EPITHELIAL CHANGES CONSISTENT WITH REFLUX RELATED  INJURY. - NEGATIVE FOR INTESTINAL METAPLASIA WITH GOBLET CELLS ([name]'S ESOPHAGUS). - NEGATIVE FOR MORPHOLOGICAL FEATURES OF EOSINOPHILIC ESOPHAGITIS. - REACTIVE AND INFLAMED GLANDULAR/COLUMNAR MUCOSA.  Assessment / Plan:   1) 4cm long, 3.7cm across eccentric (non-circumferential) uT3N1 (Stage IIIA) GE junction adenocarinoma, [place] type III associated with [name]'s. PET scan is pending. Patient now has completed 5 cycles of chemotherapy and will complete radiation therapy early next week. I'll plan to see the patient back in one month and at that time we'll obtain a CT scan of chest  abdomen pelvis to make sure that no distant disease has developed. An plan to proceed with transhiatal total esophagectomy and cervical esophagogastrostomy and feeding jejunostomy tube in November.    2) known COPD, full PFTs done 3) isotope stress test was done recently without evidence of ischemia,      I  spent 40 minutes counseling the patient face to face and 50% or more the  time was spent in counseling and coordination of care. The total time spent in the appointment was 60 minutes.  [name] MD      [address] [place] [address] Office [phone]   Beeper [phone]  05/25/2015 9:53 AM

## 2015-05-26 ENCOUNTER — Ambulatory Visit
Admission: RE | Admit: 2015-05-26 | Discharge: 2015-05-26 | Disposition: A | Discharge status: Home / Self Care | Payer: Medicare Other | Source: Ambulatory Visit | Attending: Radiation Oncology | Admitting: Radiation Oncology

## 2015-05-26 ENCOUNTER — Encounter: Payer: Self-pay | Admitting: Radiation Oncology

## 2015-05-26 VITALS — BP 103/52 | HR 102 | Temp 97.7°F | Resp 20 | Wt 256.9 lb

## 2015-05-26 DIAGNOSIS — C160 Malignant neoplasm of cardia: Secondary | ICD-10-CM | POA: Diagnosis not present

## 2015-05-26 DIAGNOSIS — D649 Anemia, unspecified: Secondary | ICD-10-CM | POA: Diagnosis not present

## 2015-05-26 DIAGNOSIS — J449 Chronic obstructive pulmonary disease, unspecified: Secondary | ICD-10-CM | POA: Diagnosis not present

## 2015-05-26 DIAGNOSIS — N529 Male erectile dysfunction, unspecified: Secondary | ICD-10-CM | POA: Diagnosis not present

## 2015-05-26 DIAGNOSIS — K219 Gastro-esophageal reflux disease without esophagitis: Secondary | ICD-10-CM | POA: Diagnosis not present

## 2015-05-26 DIAGNOSIS — Z510 Encounter for antineoplastic radiation therapy: Secondary | ICD-10-CM | POA: Diagnosis not present

## 2015-05-26 MED ORDER — ONDANSETRON HCL 8 MG PO TABS: 8.0000 mg | ORAL_TABLET | Freq: Three times a day (TID) | ORAL | Status: DC | PRN

## 2015-05-26 NOTE — Progress Notes (Addendum)
Weekly rad tx 27/28 completed, c/o nausea, and constipation, no bm 2 days,  Poor appetite, compazine not helping,   Fatigued, saw Dr. Servando Snare yesterday, to wait 6 weeks before hospitalization,  ,eating softer foods,  Swallowing difficulty BP 103/52 mmHg  Pulse 102  Temp(Src) 97.7 F (36.5 C) (Oral)  Resp 20  Wt 256 lb 14.4 oz (116.529 kg)  Wt Readings from Last 3 Encounters:  05/26/15 256 lb 14.4 oz (116.529 kg)  05/25/15 258 lb (117.028 kg)  05/19/15 258 lb 9.6 oz (117.3 kg)

## 2015-05-26 NOTE — Progress Notes (Signed)
Department of Radiation Oncology  Phone:  [phone] Fax:        [phone]  Weekly Treatment Note    Name: Ronald Johnson Date: 05/26/2015 MRN: [id number] DOB: 1945-11-15   Current dose: 48.66 Gy  Current fraction: 27 out of 28 complete   MEDICATIONS: Current Outpatient Prescriptions  Medication Sig Dispense Refill  . atorvastatin (LIPITOR) 10 MG tablet Take 1 tablet by mouth  every day 90 tablet 1  . diazepam (VALIUM) 5 MG tablet Take 1 tablet (5 mg total) by mouth 2 (two) times daily as needed. 180 tablet 1  . glucose blood (PRECISION XTRA TEST STRIPS) test strip Check blood sugar three times a day dx 250.01 270 each 3  . hyaluronate sodium (RADIAPLEXRX) GEL Apply 1 application topically daily.    Marland Kitchen HYDROcodone-acetaminophen (NORCO/VICODIN) 5-325 MG per tablet Take 1 tablet by mouth every 6 (six) hours as needed for moderate pain. 60 tablet 0  . insulin NPH Human (NOVOLIN N RELION) 100 UNIT/ML injection Inject 5 mLs (500 Units total) into the skin every morning. And syringes 1/day (Patient taking differently: Inject 300 Units into the skin every morning. And syringes 1/day) 500 mL 3  . Insulin Syringe-Needle U-100 30G X 3/8" 1 ML MISC Use to inject insulin 3 times per day. 300 each 2  . losartan-hydrochlorothiazide (HYZAAR) 100-12.5 MG per tablet Take 1 tablet by mouth  daily 90 tablet 2  . mirtazapine (REMERON) 15 MG tablet Take 1 tablet (15 mg total) by mouth at bedtime. 30 tablet 1  . omeprazole (PRILOSEC) 40 MG capsule Take 1 capsule (40 mg total) by mouth daily. 30 capsule 3  . prochlorperazine (COMPAZINE) 10 MG tablet Take 1 tablet (10 mg total) by mouth every 6 (six) hours as needed for nausea. 60 tablet 1  . sildenafil (REVATIO) 20 MG tablet TAKE 1-5 TABLETS BY MOUTH ONCE DAILY AS NEEDED 50 tablet 1  . sucralfate (CARAFATE) 1 G tablet Take 1 tablet (1 g total) by mouth 4 (four) times daily. 120 tablet 2  . ondansetron (ZOFRAN) 8 MG tablet Take 1 tablet (8 mg total)  by mouth every 8 (eight) hours as needed for nausea or vomiting. 30 tablet 1  . [DISCONTINUED] metoprolol succinate (TOPROL-XL) 25 MG 24 hr tablet Take 25 mg by mouth daily.      No current facility-administered medications for this encounter.     ALLERGIES: Review of patient's allergies indicates no known allergies.   LABORATORY DATA:  Lab Results  Component Value Date   WBC 3.4* 05/18/2015   HGB 12.4* 05/18/2015   HCT 36.5* 05/18/2015   MCV 91.1 05/18/2015   PLT 124* 05/18/2015   Lab Results  Component Value Date   NA 137 05/18/2015   K 4.1 05/18/2015   CL 103 01/31/2015   CO2 29 05/18/2015   Lab Results  Component Value Date   ALT 14 05/04/2015   AST 15 05/04/2015   ALKPHOS 77 05/04/2015   BILITOT 0.36 05/04/2015     NARRATIVE: Ronald Johnson was seen today for weekly treatment management. The patient presents with symptoms of nausea, constipation, poor appetite, and fatigue. He reports not bowel movement in the past two days and that the medication Compazine is not helping alleviate his symptoms of nausea. He met with Dr. Servando Snare, MD yesterday on 05/25/2015. The patient stated that Dr. Servando Snare, MD recommended a CT scan to take place in 4 weeks and surgery to take place in 6 weeks. Due to his  difficulty swallowing he has been eating "softer" foods. "I'm swallowing fine." He confirms that he does not ever feel the need to actually throw up, however, "I am nauseated most of the time." The chart was checked and the patient's films were reviewed. The patient projected a healthy mental status and was accompanied by his wife. He is very excited about finishing treatments soon. He uses the pharmacy [facility] off of [address].  9:26 AM Wt Readings from Last 3 Encounters:  05/26/15 256 lb 14.4 oz (116.529 kg)  05/25/15 258 lb (117.028 kg)  05/19/15 258 lb 9.6 oz (117.3 kg)   BP 103/52 mmHg  Pulse 102  Temp(Src) 97.7 F (36.5 C) (Oral)  Resp 20  Wt 256 lb  14.4 oz (116.529 kg)  PHYSICAL EXAMINATION: weight is 256 lb 14.4 oz (116.529 kg). His oral temperature is 97.7 F (36.5 C). His blood pressure is 103/52 and his pulse is 102. His respiration is 20.  The patient was alert and oriented. His weight and vitals were stable. There is no significant changes to the status of overall health to be noted at this time.      ASSESSMENT: Ronald Johnson is a 69 year old male presenting to clinic in regards to his Cancer of the GE junction. The patient is doing satisfactorily with treatment. He is managing reported symptoms appropriately. He understands the risks, benefits, purpose, and proper administration of the medication Zofran. He understands that he can administer the medications Compazine and Zofran at the same time. The patient understands to follow-up with Dr. Servando Snare, MD in regards to his surgery, moving forward. He additionally understands the importance of maintaining proper nutrition throughout treatment to ensure his best recovery and has only lost three pounds in the past month. The patient understands that he can access his appointments and medical records via [facility] [place].   PLAN: The patient was advised to continue radiation treatment as planned. Healthy methods of management in regards to reported symptoms were reviewed in detail. A prescription of the medication Zofran was provided to alleviate reported symptoms of nausea. The patient is to continue the appropriate administration of the medication Compazine, as previously instructed. The patient is aware of his radiation oncology appointment to take place next week, as scheduled. All vocalized questions and concerns have been addressed. If the patient develops any further questions or concerns in regards to his treatment and recovery, he has been encouraged to contact Dr. Lisbeth Renshaw, MD.    This document serves as a record of services personally performed by Kyung Rudd, MD. It was created on his behalf by  Lenn Cal, a trained medical scribe. The creation of this record is based on the scribe's personal observations and the provider's statements to them. This document has been checked and approved by the attending provider.   ------------------------------------------------  Jodelle Gross, MD, PhD

## 2015-05-29 ENCOUNTER — Encounter: Payer: Self-pay | Admitting: Radiation Oncology

## 2015-05-29 ENCOUNTER — Other Ambulatory Visit: Payer: Self-pay | Admitting: *Deleted

## 2015-05-29 ENCOUNTER — Inpatient Hospital Stay: Admission: RE | Admit: 2015-05-29 | Payer: Medicare Other | Source: Ambulatory Visit | Admitting: Radiation Oncology

## 2015-05-29 ENCOUNTER — Ambulatory Visit
Admission: RE | Admit: 2015-05-29 | Discharge: 2015-05-29 | Disposition: A | Discharge status: Home / Self Care | Payer: Medicare Other | Source: Ambulatory Visit | Attending: Radiation Oncology | Admitting: Radiation Oncology

## 2015-05-29 DIAGNOSIS — Z08 Encounter for follow-up examination after completed treatment for malignant neoplasm: Secondary | ICD-10-CM

## 2015-05-29 DIAGNOSIS — Z8501 Personal history of malignant neoplasm of esophagus: Principal | ICD-10-CM

## 2015-05-29 DIAGNOSIS — J449 Chronic obstructive pulmonary disease, unspecified: Secondary | ICD-10-CM | POA: Diagnosis not present

## 2015-05-29 DIAGNOSIS — K219 Gastro-esophageal reflux disease without esophagitis: Secondary | ICD-10-CM | POA: Diagnosis not present

## 2015-05-29 DIAGNOSIS — N529 Male erectile dysfunction, unspecified: Secondary | ICD-10-CM | POA: Diagnosis not present

## 2015-05-29 DIAGNOSIS — Z510 Encounter for antineoplastic radiation therapy: Secondary | ICD-10-CM | POA: Diagnosis not present

## 2015-05-29 DIAGNOSIS — D649 Anemia, unspecified: Secondary | ICD-10-CM | POA: Diagnosis not present

## 2015-05-29 DIAGNOSIS — C160 Malignant neoplasm of cardia: Secondary | ICD-10-CM | POA: Diagnosis not present

## 2015-06-05 ENCOUNTER — Encounter: Payer: Self-pay | Admitting: Endocrinology

## 2015-06-05 ENCOUNTER — Ambulatory Visit (INDEPENDENT_AMBULATORY_CARE_PROVIDER_SITE_OTHER): Payer: Medicare Other | Admitting: Endocrinology

## 2015-06-05 VITALS — BP 118/62 | HR 93 | Temp 97.7°F | Ht 76.0 in | Wt 256.0 lb

## 2015-06-05 DIAGNOSIS — E1122 Type 2 diabetes mellitus with diabetic chronic kidney disease: Secondary | ICD-10-CM

## 2015-06-05 DIAGNOSIS — C160 Malignant neoplasm of cardia: Secondary | ICD-10-CM

## 2015-06-05 DIAGNOSIS — R634 Abnormal weight loss: Secondary | ICD-10-CM

## 2015-06-05 DIAGNOSIS — Z08 Encounter for follow-up examination after completed treatment for malignant neoplasm: Secondary | ICD-10-CM | POA: Diagnosis not present

## 2015-06-05 LAB — POCT GLYCOSYLATED HEMOGLOBIN (HGB A1C): Hemoglobin A1C: 9.2

## 2015-06-05 MED ORDER — INSULIN NPH (HUMAN) (ISOPHANE) 100 UNIT/ML ~~LOC~~ SUSP: 300.0000 [IU] | SUBCUTANEOUS | Status: DC

## 2015-06-05 NOTE — Patient Instructions (Addendum)
Please continue the same insulin for now (300 units each morning).   check your blood sugar twice a day.  vary the time of day when you check, between before the 3 meals, and at bedtime.  also check if you have symptoms of your blood sugar being too high or too low.  please keep a record of the readings and bring it to your next appointment here.  You can write it on any piece of paper.  please call us sooner if your blood sugar goes below 70, or if you have a lot of readings over 200.   Please come back for a follow-up appointment in 6 weeks.

## 2015-06-05 NOTE — Progress Notes (Signed)
Subjective:    Patient ID: Ronald Johnson, male    DOB: 19-Oct-1945, 69 y.o.   MRN: [id number]  HPI Pt returns for f/u of diabetes mellitus: DM type: Insulin-requiring type 2.  Dx'ed: 7416 Complications: polyneuropathy, PAD, and nephropathy.  Therapy: insulin since 2004.  DKA: never.  Severe hypoglycemia: never.   Pancreatitis: never.   Other: he changed to qd insulin, after he did not achieve good control on multiple daily injections; he cannot afford insulin analogs.  Interval history:  He will have esophageal surgery soon.  He takes just 300 units qam.  no cbg record, but states cbg's vary from 80-200, except for being over 500 for a few days after chemotx (he has now finished chemotx).   Past Medical History  Diagnosis Date  . COLONIC POLYPS, ADENOMATOUS 08/01/2008    09/01/2012 also  . DIABETES MELLITUS, TYPE I 03/13/2007  . HYPERCHOLESTEROLEMIA 02/03/2008  . ANEMIA 08/15/2009  . Anxiety state, unspecified 12/15/2008  . DEPRESSION 03/13/2007  . HYPERTENSION 03/13/2007  . PULMONARY NODULE 11/24/2008  . GERD 03/13/2007  . TOBACCO ABUSE 11/24/2008  . EMPHYSEMA 11/08/2008  . Prostatism   . ED (erectile dysfunction)   . [name]'s esophagus   . Hiatal hernia   . Diverticulitis   . Arthritis   . COPD (chronic obstructive pulmonary disease) [facility])     Past Surgical History  Procedure Laterality Date  . Egd  11/19/2005  . Foot fracture surgery Right 1980    right foot w/ pins and screws  . Removal pins and screws foot  1980    right foot  . Upper gastrointestinal endoscopy    . Circumcision    . Colonoscopy    . Polypectomy    . Vasectomy    . Eus N/A 03/16/2015    Procedure: UPPER ENDOSCOPIC ULTRASOUND (EUS) RADIAL;  Surgeon: Milus Banister, MD;  Location: [facility] ENDOSCOPY;  Service: Endoscopy;  Laterality: N/A;    Social History   Social History  . Marital Status: Married    Spouse Name: N/A  . Number of Children: 2  . Years of Education: N/A   Occupational History  .  INSPECTOR     works Youth worker   Social History Main Topics  . Smoking status: Former Smoker -- 1.00 packs/day for 35 years    Types: Cigarettes    Quit date: 10/31/2008  . Smokeless tobacco: Never Used  . Alcohol Use: 0.0 oz/week    0 Standard drinks or equivalent per week     Comment: 2/month only on vacation  . Drug Use: No  . Sexual Activity: No   Other Topics Concern  . 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 atorvastatin (LIPITOR) 10 MG tablet Take 1 tablet by mouth  every day 90 tablet 1  . diazepam (VALIUM) 5 MG tablet Take 1 tablet (5 mg total) by mouth 2 (two) times daily as needed. 180 tablet 1  . glucose blood (PRECISION XTRA TEST STRIPS) test strip Check blood sugar three times a day dx 250.01 270 each 3  . hyaluronate sodium (RADIAPLEXRX) GEL Apply 1 application topically daily.    Marland Kitchen losartan-hydrochlorothiazide (HYZAAR) 100-12.5 MG per tablet Take 1 tablet by  mouth  daily 90 tablet 2  . mirtazapine (REMERON) 15 MG tablet Take 1 tablet (15 mg total) by mouth at bedtime. 30 tablet 1  . omeprazole (PRILOSEC) 40 MG capsule Take 1 capsule (40 mg total) by mouth daily. 30 capsule 3  . sildenafil (REVATIO) 20 MG tablet TAKE 1-5 TABLETS BY MOUTH ONCE DAILY AS NEEDED 50 tablet 1  . HYDROcodone-acetaminophen (NORCO/VICODIN) 5-325 MG per tablet Take 1 tablet by mouth every 6 (six) hours as needed for moderate pain. (Patient not taking: Reported on 06/05/2015) 60 tablet 0  . ondansetron (ZOFRAN) 8 MG tablet Take 1 tablet (8 mg total) by mouth every 8 (eight) hours as needed for nausea or vomiting. (Patient not taking: Reported on 06/05/2015) 30 tablet 1  . prochlorperazine (COMPAZINE) 10 MG tablet Take 1 tablet (10 mg  total) by mouth every 6 (six) hours as needed for nausea. (Patient not taking: Reported on 06/05/2015) 60 tablet 1  . sucralfate (CARAFATE) 1 G tablet Take 1 tablet (1 g total) by mouth 4 (four) times daily. (Patient not taking: Reported on 06/05/2015) 120 tablet 2  . [DISCONTINUED] metoprolol succinate (TOPROL-XL) 25 MG 24 hr tablet Take 25 mg by mouth daily.      No current facility-administered medications on file prior to visit.    No Known Allergies  Family History  Problem Relation Age of Onset  . Stroke Mother   . Diabetes Mother   . Colon cancer Neg Hx   . Diabetes Maternal Grandmother     mother side of the family aunts, MGF  . Breast cancer Paternal Grandmother     BP 118/62 mmHg  Pulse 93  Temp(Src) 97.7 F (36.5 C) (Oral)  Ht _0  (1.93 m)  Wt 256 lb (116.121 kg)  BMI 31.17 kg/m2  SpO2 94%  Review of Systems He denies hypoglycemia.      Objective:   Physical Exam VITAL SIGNS:  See vs page GENERAL: no distress Pulses: dorsalis pedis intact bilat.   MSK: no deformity of the feet CV: no leg edema Skin:  no ulcer on the feet.  normal color and temp on the feet. Neuro: sensation is intact to touch on the feet.     A1c=9.2%    Assessment & Plan:  DM: control is apparently improved. Esophageal cancer.  Pt reports this a1c was affected by steroid rx, so we'll continue the same insulin for now.   Weight loss.  Pt is advised to continue his dietary efforts.    Patient is advised the following: Patient Instructions  Please continue the same insulin for now (300 units each morning).   check your blood sugar twice a day.  vary the time of day when you check, between before the 3 meals, and at bedtime.  also check if you have symptoms of your blood sugar being too high or too low.  please keep a record of the readings and bring it to your next appointment here.  You can write it on any piece of paper.  please call us sooner if your blood sugar goes below 70, or if  you have a lot of readings over 200.   Please come back for a follow-up appointment in 6 weeks.

## 2015-06-06 ENCOUNTER — Telehealth: Payer: Self-pay | Admitting: Endocrinology

## 2015-06-06 ENCOUNTER — Other Ambulatory Visit: Payer: Self-pay | Admitting: *Deleted

## 2015-06-06 MED ORDER — INSULIN NPH (HUMAN) (ISOPHANE) 100 UNIT/ML ~~LOC~~ SUSP: SUBCUTANEOUS | Status: DC

## 2015-06-06 MED ORDER — "INSULIN SYRINGE-NEEDLE U-100 30G X 3/8"" 1 ML MISC": Status: AC

## 2015-06-06 NOTE — Telephone Encounter (Deleted)
Sorry her name was Ronald Johnson, not amy

## 2015-06-06 NOTE — Telephone Encounter (Addendum)
Ronald Johnson from [facility] call asking is 300 units is correct dosage for patient to take in the morning . Please advise [phone] insulin NPH Human (HUMULIN N) 100 UNIT/ML injection 280 mL 0 06/06/2015      Sig: Inject 300 units into the skin every morning.    E-Prescribing Status: Receipt confirmed by pharmacy (06/06/2015 11:51 AM EDT)

## 2015-06-06 NOTE — Telephone Encounter (Signed)
Pt's ins does not cover Novolin NPH. Per Dr Loanne Drilling ok to switch to [facility].

## 2015-06-07 NOTE — Progress Notes (Signed)
  Radiation Oncology         [phone] ________________________________  Name: Ronald Johnson MRN: [id number]  Date: 05/29/2015  DOB: 07-04-1946  End of Treatment Note  Diagnosis:   Cancer of the GE junction     Indication for treatment::  curative       Radiation treatment dates:   04/19/15 - 05/29/15  Site/dose:   The patient was treated to the upper abdominal region initially to a dose of 45 gray using a 6 field 3-D conformal technique. The patient then received a 5.4 gray boost using a 3 field technique. The patient's final dose was 50.4 gray which was given with concurrent chemotherapy.  Narrative: The patient tolerated radiation treatment relatively well.   For much of his treatment, the patient's appetite remained quite good.  Plan: The patient has completed radiation treatment. The patient will return to radiation oncology clinic for routine followup in one month. I advised the patient to call or return sooner if they have any questions or concerns related to their recovery or treatment. ________________________________  Jodelle Gross, M.D., Ph.D.

## 2015-06-09 ENCOUNTER — Telehealth: Payer: Self-pay | Admitting: Oncology

## 2015-06-09 ENCOUNTER — Ambulatory Visit (HOSPITAL_BASED_OUTPATIENT_CLINIC_OR_DEPARTMENT_OTHER): Payer: Medicare Other | Admitting: Oncology

## 2015-06-09 VITALS — BP 106/60 | HR 86 | Temp 98.2°F | Resp 18 | Ht 76.0 in | Wt 253.7 lb

## 2015-06-09 DIAGNOSIS — R110 Nausea: Secondary | ICD-10-CM

## 2015-06-09 DIAGNOSIS — J449 Chronic obstructive pulmonary disease, unspecified: Secondary | ICD-10-CM

## 2015-06-09 DIAGNOSIS — C160 Malignant neoplasm of cardia: Secondary | ICD-10-CM | POA: Diagnosis not present

## 2015-06-09 NOTE — Progress Notes (Signed)
  Ronald Johnson OFFICE PROGRESS NOTE   Diagnosis: Esophagus cancer  INTERVAL HISTORY:   Ronald Johnson returns as scheduled. He completed a final treatment with Taxol/carboplatin 05/18/2015. He completed radiation 05/29/2015. He complains of constant nausea. The nausea is relieved with Alka-Seltzer. He has postprandial burning subxiphoid pain. He takes 2-4 nutrition supplements per day. He saw Dr. Servando Snare and is being scheduled for restaging CTs prior to surgical planning.  Objective:  Vital signs in last 24 hours:  Blood pressure 106/60, pulse 86, temperature 98.2 F (36.8 C), temperature source Oral, resp. rate 18, height 6\' 4"  (1.93 m), weight 253 lb 11.2 oz (115.078 kg), SpO2 97 %.    HEENT: No thrush or ulcers Lymphatics: No cervical, supraclavicular, or axillary nodes Resp: Distant breath sounds, no respiratory distress Cardio: Regular rate and rhythm GI: No hepatomegaly, no mass, mild tenderness in the subxiphoid region Vascular: No leg edema   Lab Results:  Lab Results  Component Value Date   WBC 3.4* 05/18/2015   HGB 12.4* 05/18/2015   HCT 36.5* 05/18/2015   MCV 91.1 05/18/2015   PLT 124* 05/18/2015   NEUTROABS 2.5 05/18/2015    Medications: I have reviewed the patient's current medications.  Assessment/Plan: 1. GE junction carcinoma-adenocarcinoma, status post an endoscopic biopsy 03/08/2015  EUS 03/16/2015 confirmed a clinical stage IIb (uT3,uN1) tumor  Staging CTs of the chest, abdomen, and pelvis with no evidence of metastatic disease  Initiation of radiation 04/19/2015, cycle 1 Taxol/carboplatin 04/20/2015: Radiation completed 05/29/2015  2. Postprandial subxiphoid pain secondary to #1  3. Diabetes-elevated blood sugar readings at the [facility] and at home  4. COPD  5. Depression  6. Hypertension  7. History of adenomatous colon polyps  8. History of diabetic related neuropathy, peripheral arterial disease, and  nephropathy  9.   Nausea-likely related to radiation esophagitis/gastritis     Disposition:  Ronald Johnson completed neoadjuvant chemotherapy/radiation. He continues to have subxiphoid pain and has developed nausea over the past few weeks. He will try Nexium instead of Prilosec to see if this helps the burning discomfort and nausea. He will contact us if his symptoms are not improved over the next week. He will see Dr. Servando Snare with a restaging CT evaluation 07/06/2015. Ronald Johnson will return for an office visit here 07/07/2015.  Betsy Coder, MD  06/09/2015  11:34 AM

## 2015-06-09 NOTE — Telephone Encounter (Signed)
Gave and printed appt sched and avs fo rpt; for NOV  °

## 2015-06-12 ENCOUNTER — Telehealth: Payer: Self-pay | Admitting: *Deleted

## 2015-06-12 ENCOUNTER — Other Ambulatory Visit: Payer: Self-pay | Admitting: *Deleted

## 2015-06-12 MED ORDER — ESOMEPRAZOLE MAGNESIUM 40 MG PO CPDR: 40.0000 mg | DELAYED_RELEASE_CAPSULE | Freq: Every day | ORAL | Status: DC

## 2015-06-12 NOTE — Telephone Encounter (Signed)
VM message received from pt asking about taking Nexium instead of his prilosec. Is he to take it OTC or should he expect a prescription to be called in. There is not a prescription for this at his pharmacy.  Please Advise.

## 2015-06-12 NOTE — Telephone Encounter (Signed)
Notified pt that Nexium 40 mg sent in to his pharmacy per Dr. [name].  Pt verbalized understanding and expressed appreciation for call back.

## 2015-06-14 ENCOUNTER — Telehealth: Payer: Self-pay | Admitting: *Deleted

## 2015-06-14 NOTE — Telephone Encounter (Signed)
FMLA  Paperwork faxed to [employer], Ms. Dobert  05/24/15

## 2015-06-21 ENCOUNTER — Telehealth: Payer: Self-pay | Admitting: *Deleted

## 2015-06-21 NOTE — Telephone Encounter (Signed)
  Oncology Nurse Navigator Documentation    Navigator Encounter Type: Telephone (06/21/15 1620): Left VM to inquire if the Nexium has helped his GI symptoms? Call back if not improved.

## 2015-06-23 ENCOUNTER — Telehealth: Payer: Self-pay | Admitting: *Deleted

## 2015-06-23 ENCOUNTER — Telehealth: Payer: Self-pay | Admitting: Oncology

## 2015-06-23 NOTE — Telephone Encounter (Signed)
Received a call from [facility] with Dr. Gearldine Shown office that Ronald Johnson is having severe pain in his esophagus.  Called Ronald Johnson and he said that the pain started about two weeks ago.  He finished radiation on 05/29/15 and said he was concerned more with nausea at that time.  He said the pain occurs after he swallows and is sharp and stabbing.  He said it lasts for a few minutes and then eases.  He said he is not taking Carafate because it does not help.  He does take Nexium daily and Vicodin 3 times a day before he eats.  He said the pain is similar to when his cancer was first discovered.  Advised him that Dr. Lisbeth Renshaw will be contact and that we will call him back.

## 2015-06-23 NOTE — Telephone Encounter (Signed)
Notified Santiago Glad, RN with Dr. Lisbeth Renshaw of pt's call. Pain may be related to radiation, they will contact pt.

## 2015-06-23 NOTE — Telephone Encounter (Signed)
Called [place] and advised him that we will be calling him back with a one month appointment with Dr. Lisbeth Renshaw next week.  He verbalized agreement.

## 2015-06-23 NOTE — Telephone Encounter (Signed)
Oncology Nurse Navigator Documentation  Oncology Nurse Navigator Flowsheets 06/23/2015  Referral date to RadOnc/MedOnc -  Navigator Encounter Type Telephone  Patient Visit Type -  Treatment Phase Treatment completed 05/29/15  Barriers/Navigation Needs Family concerns--severe, sharp pain at site of radiation after he swallows; progressive; lasts several minutes. Asking if this is normal?  Education -  Interventions Refer to MD  Referrals -  Education Method -  Support Groups/Services -  Time Spent with Patient 10  Reports Nexium has helped his heartburn and nausea. Has not been taking the pain med or Carafate any longer. Pain makes it difficult for him to be willing to eat. Takes small bites and chews well, but still has the pain.

## 2015-06-26 ENCOUNTER — Telehealth: Payer: Self-pay

## 2015-06-26 ENCOUNTER — Other Ambulatory Visit: Payer: Self-pay | Admitting: Oncology

## 2015-06-26 MED ORDER — DIAZEPAM 5 MG PO TABS: 5.0000 mg | ORAL_TABLET | Freq: Two times a day (BID) | ORAL | Status: DC | PRN

## 2015-06-26 NOTE — Telephone Encounter (Signed)
Rx sent per pt's request.  

## 2015-06-26 NOTE — Telephone Encounter (Signed)
i printed 

## 2015-06-26 NOTE — Telephone Encounter (Signed)
Received a refill request for Diazapam. Rx was last refilled on 11/22/2014. Thanks!

## 2015-06-28 ENCOUNTER — Ambulatory Visit
Admission: RE | Admit: 2015-06-28 | Discharge: 2015-06-28 | Disposition: A | Discharge status: Home / Self Care | Payer: Medicare Other | Source: Ambulatory Visit | Attending: Radiation Oncology | Admitting: Radiation Oncology

## 2015-06-28 ENCOUNTER — Encounter: Payer: Self-pay | Admitting: Radiation Oncology

## 2015-06-28 VITALS — BP 121/70 | HR 94 | Temp 97.7°F | Resp 20 | Wt 253.2 lb

## 2015-06-28 DIAGNOSIS — C160 Malignant neoplasm of cardia: Secondary | ICD-10-CM

## 2015-06-28 MED ORDER — LIDOCAINE VISCOUS 2 % MT SOLN: 10.0000 mL | OROMUCOSAL | Status: DC | PRN

## 2015-06-28 NOTE — Progress Notes (Addendum)
Follow up s/p rad txs 04/19/15-05/29/15, patient main c/o sharp stabbing pain bottom of left breast bone, pain 7/10 today took hydrocodone last night, none today, not taking carafte  But taking protonix, eats and drinks without difficulty swallowing but as soon as food/liquids go down, increased pain, no nausea or fatigue  CT 07/20/15   And appt with Dr. Servando Snare same day BP 121/70 mmHg  Pulse 94  Temp(Src) 97.7 F (36.5 C) (Oral)  Resp 20  Wt 253 lb 3.2 oz (114.851 kg)  Wt Readings from Last 3 Encounters:  06/28/15 253 lb 3.2 oz (114.851 kg)  06/09/15 253 lb 11.2 oz (115.078 kg)  06/05/15 256 lb (116.121 kg)   10:05 AM

## 2015-06-28 NOTE — Progress Notes (Signed)
Radiation Oncology         [phone] ________________________________  Name: Ronald Johnson MRN: [id number]  Date: 06/28/2015  DOB: March 25, 1946  Follow-Up Visit Note  CC: [name], MD  Ladell Pier, MD  Diagnosis: Cancer of the GE junction    ICD-9-CM ICD-10-CM   1. GE junction carcinoma ([place]) 151.0 C16.0     Interval Since Last Radiation:  1  months. 04/19/15 - 05/29/15  Site/dose:   The patient was treated to the upper abdominal region initially to a dose of 45 gray using a 6 field 3-D conformal technique. The patient then received a 5.4 gray boost using a 3 field technique. The patient's final dose was 50.4 gray which was given with concurrent chemotherapy.  Narrative:  The patient returns today for routine follow-up. Patient complains of sharp stabbing pain in the bottom of left breast bone. The pain is a 7/10 today. He took Vicodin last night, but none today. He is not taking carafate because he states it doesn't help him. He eats and drinks without difficulty. However, as soon as food/liquids go down he has the pain. He denies nausea, emesis, or fatigue.  ALLERGIES:  has No Known Allergies.  Meds: Current Outpatient Prescriptions  Medication Sig Dispense Refill  . atorvastatin (LIPITOR) 10 MG tablet Take 1 tablet by mouth  every day 90 tablet 1  . diazepam (VALIUM) 5 MG tablet Take 1 tablet (5 mg total) by mouth 2 (two) times daily as needed. 180 tablet 1  . esomeprazole (NEXIUM) 40 MG capsule Take 1 capsule (40 mg total) by mouth daily at 12 noon. 30 capsule 0  . glucose blood (PRECISION XTRA TEST STRIPS) test strip Check blood sugar three times a day dx 250.01 270 each 3  . HYDROcodone-acetaminophen (NORCO/VICODIN) 5-325 MG per tablet Take 1 tablet by mouth every 6 (six) hours as needed for moderate pain. 60 tablet 0  . insulin NPH Human (HUMULIN N) 100 UNIT/ML injection Inject 300 units into the skin every morning. 280 mL 0  . Insulin Syringe-Needle U-100 30G X  3/8" 1 ML MISC Use to inject insulin 3 times daily. 300 each 1  . losartan-hydrochlorothiazide (HYZAAR) 100-12.5 MG per tablet Take 1 tablet by mouth  daily 90 tablet 2  . mirtazapine (REMERON) 15 MG tablet Take 1 tablet by mouth at  bedtime 30 tablet 1  . hyaluronate sodium (RADIAPLEXRX) GEL Apply 1 application topically daily.    Marland Kitchen lidocaine (XYLOCAINE) 2 % solution Use as directed 10 mLs in the mouth or throat every 4 (four) hours as needed. 450 mL 1  . sildenafil (REVATIO) 20 MG tablet TAKE 1-5 TABLETS BY MOUTH ONCE DAILY AS NEEDED (Patient not taking: Reported on 06/28/2015) 50 tablet 1  . [DISCONTINUED] metoprolol succinate (TOPROL-XL) 25 MG 24 hr tablet Take 25 mg by mouth daily.      No current facility-administered medications for this encounter.    Physical Findings: The patient is in no acute distress. Patient is alert and oriented.  weight is 253 lb 3.2 oz (114.851 kg). His oral temperature is 97.7 F (36.5 C). His blood pressure is 121/70 and his pulse is 94. His respiration is 20.   The patient has lost 5 lbs in the past month.  Lab Findings: Lab Results  Component Value Date   WBC 3.4* 05/18/2015   WBC 7.4 01/31/2015   HGB 12.4* 05/18/2015   HGB 12.4* 01/31/2015   HCT 36.5* 05/18/2015   HCT 36.4* 01/31/2015  PLT 124* 05/18/2015   PLT 198.0 01/31/2015    Lab Results  Component Value Date   NA 137 05/18/2015   NA 134* 01/31/2015   K 4.1 05/18/2015   K 4.4 01/31/2015   CHLORIDE 100 05/18/2015   CO2 29 05/18/2015   CO2 24 01/31/2015   GLUCOSE 200* 05/18/2015   GLUCOSE 239* 01/31/2015   GLUCOSE 129* 06/30/2006   BUN 21.9 05/18/2015   BUN 27* 03/16/2015   CREATININE 1.2 05/18/2015   CREATININE 1.26* 03/16/2015   BILITOT 0.36 05/04/2015   BILITOT 0.3 01/31/2015   ALKPHOS 77 05/04/2015   ALKPHOS 63 01/31/2015   AST 15 05/04/2015   AST 18 01/31/2015   ALT 14 05/04/2015   ALT 18 01/31/2015   PROT 6.7 05/04/2015   PROT 6.9 01/31/2015   ALBUMIN 3.5  05/04/2015   ALBUMIN 4.3 01/31/2015   CALCIUM 9.7 05/18/2015   CALCIUM 9.4 01/31/2015   ANIONGAP 8 05/18/2015    Radiographic Findings: No results found.  Impression:  The patient is recovering from the effects of radiation. The patient is having some ongoing significant esophagitis which peaked after his treatment. The patient is taking Nexium and Vicodin, but is not taking Carafate since he felt it has not helped at all. Otherwise, the patient is doing well.  Plan: He has a CT scheduled on 07/20/15 and an appointment with Dr. Servando Snare the same day. I advised the patient to f/u with Dr. Henrene Pastor, his gastroenterologist, concerning his pain. For assessment and possible endoscopy. I will prescribe viscous Lidocaine for his pain in the interim.  The patient's pharmacy is the [facility] on [address] in [place], [place]. _____________________________________ Jodelle Gross, M.D., Ph.D.  This document serves as a record of services personally performed by Kyung Rudd, MD. It was created on his behalf by Darcus Austin, a trained medical scribe. The creation of this record is based on the scribe's personal observations and the provider's statements to them. This document has been checked and approved by the attending provider.

## 2015-06-29 ENCOUNTER — Other Ambulatory Visit: Payer: Self-pay | Admitting: *Deleted

## 2015-06-29 DIAGNOSIS — C160 Malignant neoplasm of cardia: Secondary | ICD-10-CM

## 2015-07-03 ENCOUNTER — Telehealth: Payer: Self-pay | Admitting: *Deleted

## 2015-07-03 ENCOUNTER — Other Ambulatory Visit: Payer: Self-pay | Admitting: Endocrinology

## 2015-07-03 ENCOUNTER — Telehealth: Payer: Self-pay | Admitting: Internal Medicine

## 2015-07-03 NOTE — Telephone Encounter (Signed)
CALLED PATIENT TO INFORM OF AN APPT. WITH GI PA - JESSICA ZAIR ON 07-07-15- ARRIVAL TIME- 1:45 PM, LVM FOR A RETURN CALL

## 2015-07-03 NOTE — Telephone Encounter (Signed)
Informed Enid Derry of this and she will call the patient.

## 2015-07-03 NOTE — Telephone Encounter (Signed)
Pt can see Alonza Bogus PA 07/07/15@2pm . Please let Enid Derry know.

## 2015-07-06 ENCOUNTER — Other Ambulatory Visit: Payer: Self-pay

## 2015-07-06 ENCOUNTER — Other Ambulatory Visit: Payer: Medicare Other

## 2015-07-06 ENCOUNTER — Encounter: Payer: Medicare Other | Admitting: Cardiothoracic Surgery

## 2015-07-06 MED ORDER — INSULIN NPH (HUMAN) (ISOPHANE) 100 UNIT/ML ~~LOC~~ SUSP: SUBCUTANEOUS | Status: DC

## 2015-07-07 ENCOUNTER — Encounter: Payer: Self-pay | Admitting: Gastroenterology

## 2015-07-07 ENCOUNTER — Ambulatory Visit (HOSPITAL_BASED_OUTPATIENT_CLINIC_OR_DEPARTMENT_OTHER): Payer: Medicare Other | Admitting: Nurse Practitioner

## 2015-07-07 ENCOUNTER — Ambulatory Visit (INDEPENDENT_AMBULATORY_CARE_PROVIDER_SITE_OTHER): Payer: Medicare Other | Admitting: Gastroenterology

## 2015-07-07 ENCOUNTER — Telehealth: Payer: Self-pay | Admitting: Oncology

## 2015-07-07 VITALS — BP 123/67 | HR 99 | Temp 98.0°F | Resp 18 | Ht 76.0 in | Wt 253.3 lb

## 2015-07-07 VITALS — BP 100/60 | HR 72 | Ht 76.0 in | Wt 255.0 lb

## 2015-07-07 DIAGNOSIS — R1012 Left upper quadrant pain: Secondary | ICD-10-CM

## 2015-07-07 DIAGNOSIS — C160 Malignant neoplasm of cardia: Secondary | ICD-10-CM

## 2015-07-07 MED ORDER — ESOMEPRAZOLE MAGNESIUM 40 MG PO CPDR: 40.0000 mg | DELAYED_RELEASE_CAPSULE | Freq: Every day | ORAL | Status: DC

## 2015-07-07 MED ORDER — OXYCODONE-ACETAMINOPHEN 5-325 MG PO TABS: 1.0000 | ORAL_TABLET | Freq: Four times a day (QID) | ORAL | Status: DC | PRN

## 2015-07-07 NOTE — Progress Notes (Addendum)
  [facility] OFFICE PROGRESS NOTE   Diagnosis:  Esophagus cancer  INTERVAL HISTORY:   Mr. Blackmon returns as scheduled. He reports increased left lower chest/upper abdominal pain with eating. The pain is the same pain he was experiencing at diagnosis. Norco provides partial relief. He would like a stronger pain medication. He denies any reflux symptoms. No nausea or vomiting. He has constipation which he relates to the pain medication. He takes milk of magnesia as needed with good results.  Objective:  Vital signs in last 24 hours:  Blood pressure 123/67, pulse 99, temperature 98 F (36.7 C), temperature source Oral, resp. rate 18, height 6\' 4"  (1.93 m), weight 253 lb 4.8 oz (114.896 kg), SpO2 96 %.    HEENT: No thrush or ulcers. Lymphatics: No palpable cervical or supclavicular lymph nodes. Resp: Distant breath sounds. Cardio: Regular rate and rhythm. GI: Abdomen soft and nontender. No mass. No organomegaly. Vascular: No leg edema.    Lab Results:  Lab Results  Component Value Date   WBC 3.4* 05/18/2015   HGB 12.4* 05/18/2015   HCT 36.5* 05/18/2015   MCV 91.1 05/18/2015   PLT 124* 05/18/2015   NEUTROABS 2.5 05/18/2015    Imaging:  No results found.  Medications: I have reviewed the patient's current medications.  Assessment/Plan: 1. 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 Postprandial subxiphoid pain secondary to #1  3. Diabetes-elevated blood sugar readings at the [facility] and at home  4. COPD  5. Depression  6. Hypertension  7. History of adenomatous colon  polyps  8. History of diabetic related neuropathy, peripheral arterial disease, and nephropathy  9. Nausea-likely related to radiation esophagitis/gastritis   Disposition: Mr. Echard has completed the course of concurrent radiation and chemotherapy. He continues to have pain with eating. We gave him a prescription for Percocet 5/325 one to 2 tablets every 6 hours as needed. He has an appointment later today at Dr. Blanch Media office. He may need a follow-up upper endoscopy.  He is scheduled for restaging CT scans and a visit with Dr. Servando Snare on 07/20/2015.  He will return for a follow-up visit here on 07/21/2015. He will contact the office in the interim with any problems. We specifically discussed poor pain control.  Patient seen with Dr. [name].    Ned Card ANP/GNP-BC   07/07/2015  9:35 AM   This was a shared visit with Ned Card. Mr. Student continues to have left subcostal pain after eating. This may be related to toxicity from radiation or persistent tumor in the esophagus. He will see Dr. Henrene Pastor today.  Julieanne Manson, M.D.

## 2015-07-07 NOTE — Telephone Encounter (Signed)
Gave and printed appt sched and avs for pt for NOV and DEc °

## 2015-07-07 NOTE — Progress Notes (Signed)
     07/07/2015 Ronald Johnson [id number] July 03, 1946   History of Present Illness:  This is a 69 year old male with GE junction cancer who has undergone radiation and chemotherapy, but has not yet had any surgical intervention.  This was diagnosed during EGD in June by Dr. Henrene Pastor.  He then underwent EUS.   Has appt with Dr. Servando Snare on December 1 and is having CT scan prior to that visit.  Initially presented with left upper quadrant abdominal pain prior to his diagnosis this past summer, which is why he underwent the EGD. Continues to have left upper quadrant abdominal pain that has worsened. He has pain with eating or drinking anything including water. He denies any dysphagia or painful swallowing. No relief despite Carafate suspension or lidocaine or PPI therapy; is on Nexium 40 mg daily. Sent here again by oncology to see if we believe there is any role for repeat endoscopy.   Current Medications, Allergies, Past Medical History, Past Surgical History, Family History and Social History were reviewed in [employer] record.   Physical Exam: BP 100/60 mmHg  Pulse 72  Ht 6\' 4"  (1.93 m)  Wt 255 lb (115.667 kg)  BMI 31.05 kg/m2 General: Well developed white male in no acute distress Head: Normocephalic and atraumatic Eyes:  Sclerae anicteric, conjunctiva pink  Ears: Normal auditory acuity Lungs: Clear throughout to auscultation Heart: Regular rate and rhythm Abdomen: Soft, non-distended.  Normal bowel sounds.  Non-tender. Musculoskeletal: Symmetrical with no gross deformities  Extremities: No edema  Neurological: Alert oriented x 4, grossly non-focal Psychological:  Alert and cooperative. Normal mood and affect  Assessment and Recommendations: -69 year old male with GE junction cancer who has undergone radiation and chemotherapy, but has not yet had any surgical intervention. Initially presented with left upper quadrant abdominal pain prior to his diagnosis this  past summer. Continues to have left upper quadrant abdominal pain that has worsened. No relief despite Carafate suspension or lidocaine or PPI therapy. Sent here again by oncology to see if we believe there is any role for repeat endoscopy. Dr. Henrene Pastor will review, however, I discussed with Dr. Carlean Purl we do not believe that it will change therapy by repeating EGD.  Continue with use of pain medicine when necessary per oncology.  Will refill his Nexium 40 mg daily per his request.

## 2015-07-08 NOTE — Progress Notes (Signed)
Agree that EGD will add nothing. Looks like he has CT of interest area planned. I agree with that and plans for surgical opinion

## 2015-07-17 ENCOUNTER — Telehealth: Payer: Self-pay | Admitting: *Deleted

## 2015-07-17 NOTE — Telephone Encounter (Signed)
Received fax from [employer]. Alonza Bogus PA saw this patient on 07-07-2015.  This patient is a patient of Dr. Scarlette Shorts. Dr. Henrene Pastor did an EGD on 03-08-2015.  I filled out the form with Dr. Blanch Media name and NPI and stampled it and faxed it to [facility].  THey needed a collaborating physicians name and NPI on the prescription for Omeprazole 40 mg.  Sent to MR for scanning.

## 2015-07-18 ENCOUNTER — Encounter: Payer: Self-pay | Admitting: Endocrinology

## 2015-07-18 ENCOUNTER — Ambulatory Visit (INDEPENDENT_AMBULATORY_CARE_PROVIDER_SITE_OTHER): Payer: Medicare Other | Admitting: Endocrinology

## 2015-07-18 VITALS — BP 118/60 | HR 108 | Temp 97.9°F | Ht 76.0 in | Wt 254.0 lb

## 2015-07-18 DIAGNOSIS — N182 Chronic kidney disease, stage 2 (mild): Secondary | ICD-10-CM

## 2015-07-18 DIAGNOSIS — E1022 Type 1 diabetes mellitus with diabetic chronic kidney disease: Secondary | ICD-10-CM

## 2015-07-18 NOTE — Patient Instructions (Addendum)
Please change the remeron back to effexor.  In 1 week, increase to 150 mg/day.   Please continue the same insulin.   check your blood sugar twice a day.  vary the time of day when you check, between before the 3 meals, and at bedtime.  also check if you have symptoms of your blood sugar being too high or too low.  please keep a record of the readings and bring it to your next appointment here.  You can write it on any piece of paper.  please call us sooner if your blood sugar goes below 70, or if you have a lot of readings over 200.   Please come back for a follow-up appointment in 3 months.

## 2015-07-18 NOTE — Progress Notes (Signed)
Subjective:    Patient ID: Ronald Johnson, male    DOB: [birth year], 69 y.o.   MRN: [id number]  HPI Pt returns for f/u of diabetes mellitus: DM type: Insulin-requiring type 2.  Dx'ed: 6789 Complications: polyneuropathy, PAD, and nephropathy.  Therapy: insulin since 2004.  DKA: never.  Severe hypoglycemia: never.   Pancreatitis: never.   Other: he changed to qd insulin, after he did not achieve good control on multiple daily injections; he cannot afford insulin analogs.  Interval history:  He will have esophageal surgery soon.  He takes 300 units qam.  no cbg record, but states cbg's vary from 180-210.  It is slightly higher in am.  He says he never misses the insulin.   Past Medical History  Diagnosis Date  . COLONIC POLYPS, ADENOMATOUS 08/01/2008    09/01/2012 also  . DIABETES MELLITUS, TYPE I 03/13/2007  . HYPERCHOLESTEROLEMIA 02/03/2008  . ANEMIA 08/15/2009  . Anxiety state, unspecified 12/15/2008  . DEPRESSION 03/13/2007  . HYPERTENSION 03/13/2007  . PULMONARY NODULE 11/24/2008  . GERD 03/13/2007  . TOBACCO ABUSE 11/24/2008  . EMPHYSEMA 11/08/2008  . Prostatism   . ED (erectile dysfunction)   . [name]'s esophagus   . Hiatal hernia   . Diverticulitis   . Arthritis   . COPD (chronic obstructive pulmonary disease) [facility])     Past Surgical History  Procedure Laterality Date  . Egd  11/19/2005  . Foot fracture surgery Right 1980    right foot w/ pins and screws  . Removal pins and screws foot  1980    right foot  . Upper gastrointestinal endoscopy    . Circumcision    . Colonoscopy    . Polypectomy    . Vasectomy    . Eus N/A 03/16/2015    Procedure: UPPER ENDOSCOPIC ULTRASOUND (EUS) RADIAL;  Surgeon: Milus Banister, MD;  Location: [facility] ENDOSCOPY;  Service: Endoscopy;  Laterality: N/A;    Social History   Social History  . Marital Status: Married    Spouse Name: N/A  . Number of Children: 2  . Years of Education: N/A   Occupational History  . INSPECTOR     works  [employer]   Social History Main Topics  . Smoking status: Former Smoker -- 1.00 packs/day for 35 years    Types: Cigarettes    Quit date: 10/31/2008  . Smokeless tobacco: Never Used  . Alcohol Use: 0.0 oz/week    0 Standard drinks or equivalent per week     Comment: 2/month only on vacation  . Drug Use: No  . Sexual Activity: No   Other Topics Concern  . 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 atorvastatin (LIPITOR) 10 MG tablet Take 1 tablet by mouth  every day 90 tablet 1  . diazepam (VALIUM) 5 MG tablet Take 1 tablet (5 mg total) by mouth 2 (two) times daily as needed. 180 tablet 1  . esomeprazole (NEXIUM) 40 MG capsule Take 1 capsule (40 mg total) by mouth daily at 12 noon. 90 capsule 0  . glucose blood (PRECISION XTRA TEST STRIPS) test strip Check blood sugar three times a day dx 250.01 270 each 3  . insulin NPH Human (  HUMULIN N) 100 UNIT/ML injection Inject 300 units into the skin every morning. 9 vial 3  . Insulin Syringe-Needle U-100 30G X 3/8" 1 ML MISC Use to inject insulin 3 times daily. 300 each 1  . losartan-hydrochlorothiazide (HYZAAR) 100-12.5 MG per tablet Take 1 tablet by mouth  daily 90 tablet 2  . oxyCODONE-acetaminophen (PERCOCET/ROXICET) 5-325 MG tablet Take 1-2 tablets by mouth every 6 (six) hours as needed for severe pain. 75 tablet 0  . sildenafil (REVATIO) 20 MG tablet TAKE 1-5 TABLETS BY MOUTH ONCE DAILY AS NEEDED 50 tablet 1  . [DISCONTINUED] metoprolol succinate (TOPROL-XL) 25 MG 24 hr tablet Take 25 mg by mouth daily.      No current facility-administered medications on file prior to visit.    No Known Allergies  Family History  Problem Relation Age of Onset  . Stroke Mother   . Diabetes  Mother   . Colon cancer Neg Hx   . Diabetes Maternal Grandmother     mother side of the family aunts, MGF  . Breast cancer Paternal Grandmother     BP 118/60 mmHg  Pulse 108  Temp(Src) 97.9 F (36.6 C) (Oral)  Ht _0  (1.93 m)  Wt 254 lb (115.214 kg)  BMI 30.93 kg/m2  SpO2 97%   Review of Systems He has lost 2 lbs since last ov.  Depression persists (he wants to go back to effexor).  No SI.    Objective:   Physical Exam VITAL SIGNS:  See vs page GENERAL: no distress Pulses: dorsalis pedis intact bilat.   MSK: no deformity of the feet CV: no leg edema Skin:  no ulcer on the feet.  normal color and temp on the feet. Neuro: sensation is intact to touch on the feet.     Lab Results  Component Value Date   HGBA1C 9.2 06/05/2015      Assessment & Plan:  DM: he needs increased rx:  i advised him to increase insulin, but he declines.   Depression: slightly worse.  Esophageal cancer: this can affect his insulin requirement, so i told him he'll have to carefully follow cbg's.   Patient is advised the following: Patient Instructions  Please change the remeron back to effexor.  In 1 week, increase to 150 mg/day.   Please continue the same insulin.   check your blood sugar twice a day.  vary the time of day when you check, between before the 3 meals, and at bedtime.  also check if you have symptoms of your blood sugar being too high or too low.  please keep a record of the readings and bring it to your next appointment here.  You can write it on any piece of paper.  please call us sooner if your blood sugar goes below 70, or if you have a lot of readings over 200.   Please come back for a follow-up appointment in 3 months.

## 2015-07-19 ENCOUNTER — Other Ambulatory Visit: Payer: Self-pay | Admitting: *Deleted

## 2015-07-19 ENCOUNTER — Other Ambulatory Visit: Payer: Self-pay | Admitting: Cardiothoracic Surgery

## 2015-07-19 DIAGNOSIS — C160 Malignant neoplasm of cardia: Secondary | ICD-10-CM | POA: Diagnosis not present

## 2015-07-19 LAB — CREATININE, ISTAT: Creatinine, IStat: 1.3 mg/dL (ref 0.6–1.3)

## 2015-07-20 ENCOUNTER — Encounter: Payer: Self-pay | Admitting: Cardiothoracic Surgery

## 2015-07-20 ENCOUNTER — Other Ambulatory Visit: Payer: Self-pay | Admitting: *Deleted

## 2015-07-20 ENCOUNTER — Ambulatory Visit
Admission: RE | Admit: 2015-07-20 | Discharge: 2015-07-20 | Disposition: A | Discharge status: Home / Self Care | Payer: Medicare Other | Source: Ambulatory Visit | Attending: Cardiothoracic Surgery | Admitting: Cardiothoracic Surgery

## 2015-07-20 ENCOUNTER — Ambulatory Visit (HOSPITAL_COMMUNITY)
Admission: RE | Admit: 2015-07-20 | Discharge: 2015-07-20 | Disposition: A | Discharge status: Home / Self Care | Payer: Medicare Other | Source: Ambulatory Visit | Attending: Cardiovascular Disease | Admitting: Cardiovascular Disease

## 2015-07-20 ENCOUNTER — Ambulatory Visit (INDEPENDENT_AMBULATORY_CARE_PROVIDER_SITE_OTHER): Payer: Medicare Other | Admitting: Cardiothoracic Surgery

## 2015-07-20 VITALS — BP 147/75 | HR 72 | Resp 20 | Ht 76.0 in | Wt 254.0 lb

## 2015-07-20 DIAGNOSIS — C160 Malignant neoplasm of cardia: Secondary | ICD-10-CM | POA: Diagnosis not present

## 2015-07-20 DIAGNOSIS — I10 Essential (primary) hypertension: Secondary | ICD-10-CM | POA: Diagnosis not present

## 2015-07-20 DIAGNOSIS — E7800 Pure hypercholesterolemia, unspecified: Secondary | ICD-10-CM | POA: Diagnosis not present

## 2015-07-20 DIAGNOSIS — E119 Type 2 diabetes mellitus without complications: Secondary | ICD-10-CM | POA: Diagnosis not present

## 2015-07-20 DIAGNOSIS — C159 Malignant neoplasm of esophagus, unspecified: Secondary | ICD-10-CM

## 2015-07-20 DIAGNOSIS — Z08 Encounter for follow-up examination after completed treatment for malignant neoplasm: Secondary | ICD-10-CM

## 2015-07-20 DIAGNOSIS — Z8501 Personal history of malignant neoplasm of esophagus: Principal | ICD-10-CM

## 2015-07-20 MED ORDER — IOPAMIDOL (ISOVUE-300) INJECTION 61%
125.0000 mL | Freq: Once | INTRAVENOUS | Status: AC | PRN
  Administered 2015-07-20: 125 mL via INTRAVENOUS

## 2015-07-20 NOTE — Patient Instructions (Addendum)
Preparation for your surgery: Bowel Prep  You will need to undergo a bowel prep starting 2 days prior to your surgery.  You will start clear liquids mid-day Sunday Dec 4  and all day Dec5.  Nothing to eat to drink after  Monday Dec5 at midnight.  Clear liquid diet:  This includes broths (vegetable, chicken or beef), juices (grape, apple, or cranberry), Jell-O, popsicles, coffee or tea (without milk or cream) and Gatorade. Hard candy and gum are OK. Avoid alcohol and all solid food. You should take in at LEAST 10 cups of fluid this day. In addition to the clear liquid diet (that you will remain on the entire day).  You will also need to drink ONE 10 ounce bottle of Magnesium Citrate on Monday Dec5 . It is best if you can drink it mid-morning, around 10 am (you can buy this over the counter at your neighborhood pharmacy).  Stop Hyzaar after dose Sunday Dec4

## 2015-07-20 NOTE — Progress Notes (Addendum)
[address]       [place] [address]             [phone]                    Can C Fallert [facility]ton Medical Record #[id number] Date of Birth: 09-Apr-1946  Referring: Milus Banister, MD Primary Care: [name], MD  Chief Complaint:    Chief Complaint  Patient presents with  . Esophageal Cancer    4 week f/u review CT C/A/P    History of Present Illness:    Ronald Johnson 69 y.o. 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 The patient has a history of [name]'s esophagus dating back to biopsies in 2007. In the spring of this year he had begun having epigastric discomfort when eating and some mild trouble swallowing. A cardiac stress test was performed which was negative for ischemia endoscopy and ultimately endoscopic ultrasound were performed in late July, confirming adenocarcinoma the esophagus. The patient notes no weight loss in fact has gained approximately 15 pounds over the past 6 months. He had no blood in his stool until after the endoscopy.  The patient has known underlying pulmonary disease, he stopped smoking 6 years ago. 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  He completed  radiation  completed October 10  He completed week 5 Taxol/carboplatin on September 29   Previous screening PFTs revealed an FEV1 of 35% of predicted. Repeat pft's FEV1 2.27 54%  DLCO 20.54 50%   Wt Readings from Last 3 Encounters:  07/20/15 254 lb (115.214 kg)  07/18/15 254 lb (115.214 kg)  07/07/15 255 lb (115.667 kg)   Current Activity/ Functional Status:  Patient is independent with mobility/ambulation, transfers, ADL's, IADL's.   Zubrod Score: At the time of surgery this patient's most appropriate activity status/level should be described as: $RemoveBefor'[x]'hFAtCSZaEihH$     0    Normal activity, no symptoms $RemoveBef'[]'HiQPWikrdm$     1     Restricted in physical strenuous activity but ambulatory, able to do out light work $RemoveBe'[]'VtSQmZEbZ$     2    Ambulatory and capable of self care, unable to do work activities, up and about               >50 % of waking hours                              '[]'$     3    Only limited self care, in bed greater than 50% of waking hours $RemoveBefo'[]'FBXDOvBNZKI$     4    Completely disabled, no self care, confined to bed or chair $Remove'[]'mFTQiMC$     5    Moribund   Past Medical History  Diagnosis Date  . COLONIC POLYPS, ADENOMATOUS 08/01/2008    09/01/2012 also  . DIABETES MELLITUS, TYPE I 03/13/2007  . HYPERCHOLESTEROLEMIA 02/03/2008  . ANEMIA 08/15/2009  . Anxiety state, unspecified 12/15/2008  . DEPRESSION 03/13/2007  . HYPERTENSION 03/13/2007  . PULMONARY NODULE 11/24/2008  . GERD 03/13/2007  . TOBACCO ABUSE 11/24/2008  . EMPHYSEMA 11/08/2008  . Prostatism   . ED (erectile dysfunction)   . [name]'s esophagus   . Hiatal hernia   . Diverticulitis   . Arthritis   .  COPD (chronic obstructive pulmonary disease) [facility])     Past Surgical History  Procedure Laterality Date  . Egd  11/19/2005  . Foot fracture surgery Right 1980    right foot w/ pins and screws  . Removal pins and screws foot  1980    right foot  . Upper gastrointestinal endoscopy    . Circumcision    . Colonoscopy    . Polypectomy    . Vasectomy    . Eus N/A 03/16/2015    Procedure: UPPER ENDOSCOPIC ULTRASOUND (EUS) RADIAL;  Surgeon: Milus Banister, MD;  Location: [facility] ENDOSCOPY;  Service: Endoscopy;  Laterality: N/A;    Family History  Problem Relation Age of Onset  . Stroke Mother   . Diabetes Mother   . Colon cancer Neg Hx   . Diabetes Maternal Grandmother     mother side of the family aunts, MGF  . Breast cancer Paternal Grandmother     Social History   Social History  . Marital Status: Married    Spouse Name: N/A  . Number of Children: 2  . Years of Education: N/A   Occupational History  . INSPECTOR     works Youth worker   Social History Main Topics   . Smoking status: Former Smoker -- 1.00 packs/day for 35 years    Types: Cigarettes    Quit date: 10/31/2008  . Smokeless tobacco: Never Used  . Alcohol Use: 0.0 oz/week    0 Standard drinks or equivalent per week     Comment: 2/month only on vacation  . Drug Use: No  . Sexual Activity: No   Other Topics Concern  . Not on file   Social History Narrative   Married, wife Chilton with #2 grown children   Prior [employer], where he met his wife in [place]   Retired-prior work Personnel officer ADL's    History  Smoking status  . Former Smoker -- 1.00 packs/day for 35 years  . Types: Cigarettes  . Quit date: 10/31/2008  Smokeless tobacco  . Never Used    History  Alcohol Use  . 0.0 oz/week  . 0 Standard drinks or equivalent per week    Comment: 2/month only on vacation     No Known Allergies  Current Outpatient Prescriptions  Medication Sig Dispense Refill  . atorvastatin (LIPITOR) 10 MG tablet Take 1 tablet by mouth  every day 90 tablet 1  . diazepam (VALIUM) 5 MG tablet Take 1 tablet (5 mg total) by mouth 2 (two) times daily as needed. 180 tablet 1  . esomeprazole (NEXIUM) 40 MG capsule Take 1 capsule (40 mg total) by mouth daily at 12 noon. 90 capsule 0  . glucose blood (PRECISION XTRA TEST STRIPS) test strip Check blood sugar three times a day dx 250.01 270 each 3  . insulin NPH Human (HUMULIN N) 100 UNIT/ML injection Inject 300 units into the skin every morning. 9 vial 3  . Insulin Syringe-Needle U-100 30G X 3/8" 1 ML MISC Use to inject insulin 3 times daily. 300 each 1  . losartan-hydrochlorothiazide (HYZAAR) 100-12.5 MG per tablet Take 1 tablet by mouth  daily 90 tablet 2  . oxyCODONE-acetaminophen (PERCOCET/ROXICET) 5-325 MG tablet Take 1-2 tablets by mouth every 6 (six) hours as needed for severe pain. 75 tablet 0  . sildenafil (REVATIO) 20 MG tablet TAKE 1-5 TABLETS BY MOUTH ONCE DAILY AS NEEDED 50 tablet 1  . venlafaxine XR (EFFEXOR-XR) 150 MG 24 hr  capsule  Take 150 mg by mouth daily with breakfast.    . [DISCONTINUED] metoprolol succinate (TOPROL-XL) 25 MG 24 hr tablet Take 25 mg by mouth daily.      No current facility-administered medications for this visit.      Review of Systems:     Cardiac Review of Systems: Y or N  Chest Pain [   y ]  Resting SOB [ n  ] Exertional SOB  Blue.Reese  ]  Orthopnea [  ]   Pedal Edema [   ]    Palpitations [  ] Syncope  [  ]   Presyncope [   ]  General Review of Systems: [Y] = yes [  ]=no Constitional: recent weight change [ y ];  Wt loss over the last 3 months [   ] anorexia [  ]; fatigue [ y ]; nausea [  ]; night sweats [  ]; fever [  ]; or chills [  ];          Dental: poor dentition[  ]; Last Dentist visit:   Eye : blurred vision [  ]; diplopia [   ]; vision changes [  ];  Amaurosis fugax[  ]; Resp: cough [n  ];  wheezing[  ];  hemoptysis[  ]; shortness of breath[  ]; paroxysmal nocturnal dyspnea[  ]; dyspnea on exertion[  ]; or orthopnea[  ];  GI:  gallstones[  ], vomiting[  ];  dysphagia[  ]; melena[  ];  hematochezia [  ]; heartburn[  ];   Hx of  Colonoscopy[  ]; GU: kidney stones [  ]; hematuria[  ];   dysuria [  ];  nocturia[  ];  history of     obstruction [  ]; urinary frequency [  ]             Skin: rash, swelling[  ];, hair loss[  ];  peripheral edema[  ];  or itching[  ]; Musculosketetal: myalgias[  ];  joint swelling[  ];  joint erythema[  ];  joint pain[  ];  back pain[  ];  Heme/Lymph: bruising[  ];  bleeding[  ];  anemia[  ];  Neuro: TIA[  ];  headaches[  ];  stroke[  ];  vertigo[  ];  seizures[  ];   paresthesias[  ];  difficulty walking[  ];  Psych:depression[  ]; anxiety[  ];  Endocrine: diabetes[y  ];  thyroid dysfunction[  ];  Immunizations: Flu up to date [  ]; Pneumococcal up to date [  ];  Other:  Physical Exam: BP 147/75 mmHg  Pulse 72  Resp 20  Ht $R'6\' 4"'Gz$  (1.93 m)  Wt 254 lb (115.214 kg)  BMI 30.93 kg/m2  SpO2 96%  PHYSICAL EXAMINATION: General appearance: alert,  cooperative, appears stated age and no distress Head: Normocephalic, without obvious abnormality, atraumatic Neck: no adenopathy, no carotid bruit, no JVD, supple, symmetrical, trachea midline and thyroid not enlarged, symmetric, no tenderness/mass/nodules Lymph nodes: Cervical, supraclavicular, and axillary nodes normal. Resp: clear to auscultation bilaterally Back: symmetric, no curvature. ROM normal. No CVA tenderness. Cardio: regular rate and rhythm, S1, S2 normal, no murmur, click, rub or gallop GI: soft, non-tender; bowel sounds normal; no masses,  no organomegaly Extremities: extremities normal, atraumatic, no cyanosis or edema and Homans sign is negative, no sign of DVT Neurologic: Grossly normal Patient has full DP and PT pulses bilaterally    Diagnostic Studies & Laboratory data:     Recent Radiology  Findings:  Ct Chest W Contrast/Ct Abdomen Pelvis W Contrast  07/20/2015  CLINICAL DATA:  Adenocarcinoma of the esophagogastric junction diagnosed on 03/08/2015, status post radiation therapy and chemotherapy completed 8 weeks prior, presenting for restaging. EXAM: CT CHEST, ABDOMEN, AND PELVIS WITH CONTRAST TECHNIQUE: Multidetector CT imaging of the chest, abdomen and pelvis was performed following the standard protocol during bolus administration of intravenous contrast. CONTRAST:  147mL ISOVUE-300 IOPAMIDOL (ISOVUE-300) INJECTION 61% COMPARISON:  04/12/2015 PET-CT. 03/27/2015 chest CT. 02/17/2015 CT abdomen. FINDINGS: CT CHEST FINDINGS Mediastinum/Nodes: Normal heart size. No pericardial fluid/thickening. Left anterior descending, left circumflex and right coronary atherosclerosis. Great vessels are normal in course and caliber. There is an acute segmental pulmonary embolus within the anterior left lower lobe (series 3/image 41 and series 601/ image 82). No additional central pulmonary emboli are detected. Normal visualized thyroid. There is nonspecific circumferential mild wall thickening  in the lower thoracic esophagus extending into the proximal gastric cardia, not definitely changed since 03/27/2015. No axillary adenopathy. Stable borderline enlarged 1.0 cm left subcarinal node (series 3/ image 35), stable since 03/27/2015. No new mediastinal adenopathy. No hilar adenopathy. Lungs/Pleura: No pneumothorax. No pleural effusion. There are 6 scattered subcentimeter pulmonary nodules (1 in the posterior right upper lobe, 2 in the right lower lobe and 3 in the left lower lobe), largest 6 mm in the right lower lobe (series 4/ image 39), all unchanged since 05/14/2012, in keeping with a benign etiology. No acute consolidative airspace disease, new significant pulmonary nodules or lung masses. Musculoskeletal: No aggressive appearing focal osseous lesions. Mild-to-moderate degenerative changes in the thoracic spine. Stable mild symmetric gynecomastia. CT ABDOMEN PELVIS FINDINGS Hepatobiliary: Normal liver with no liver mass. Normal gallbladder with no radiopaque cholelithiasis. No biliary ductal dilatation. Pancreas: Normal, with no mass or duct dilation. Spleen: Normal size. No mass. Adrenals/Urinary Tract: Normal right adrenal. Mild irregular thickening of the left adrenal gland up to 1.0 cm in thickness, unchanged since 05/14/12, and benign. Simple 1.6 cm upper right renal cyst. Simple 3.5 cm lateral interpolar right renal cyst. Simple 1.8 cm medial right lower renal cyst. Simple 2.9 cm anterior interpolar left renal cyst. A few additional subcentimeter hypodense renal lesions scattered throughout both kidneys are too small to characterize and not appreciably changed. No hydronephrosis. Normal bladder. Stomach/Bowel: No new sites of wall thickening in the nondistended stomach. Normal caliber small bowel with no small bowel wall thickening. Stable small periampullary duodenal diverticulum. Normal appendix. Normal large bowel with no diverticulosis, large bowel wall thickening or pericolonic fat  stranding. Vascular/Lymphatic: Atherosclerotic nonaneurysmal abdominal aorta. Patent portal, splenic, hepatic and renal veins. No pathologically enlarged lymph nodes in the abdomen or pelvis. Reproductive: Stable top-normal size prostate with nonspecific internal prostatic calcification. Other: No pneumoperitoneum, ascites or focal fluid collection. Musculoskeletal: No aggressive appearing focal osseous lesions. Mild degenerative changes in the lumbar spine. IMPRESSION: 1. Incidentally detected acute segmental pulmonary embolus within the left lower lobe. No secondary evidence of pulmonary arterial hypertension or right heart strain. 2. Stable nonspecific mild circumferential wall thickening in the lower thoracic esophagus extending into the proximal gastric cardia, which could represent post treatment change or residual neoplasm. 3. No evidence of metastatic disease in the chest, abdomen or pelvis. 4. Three-vessel coronary atherosclerosis. Critical Value/emergent results were called by telephone at the time of interpretation on 07/20/2015 at 1:29 pm to Dr. Lanelle Bal , who verbally acknowledged these results. Electronically Signed   By: Ilona Sorrel M.D.   On: 07/20/2015 13:41  I have independently reviewed the above radiologic studies.  Recent Lab Findings: Lab Results  Component Value Date   WBC 3.4* 05/18/2015   HGB 12.4* 05/18/2015   HCT 36.5* 05/18/2015   PLT 124* 05/18/2015   GLUCOSE 200* 05/18/2015   CHOL 170 12/02/2014   TRIG * 12/02/2014    449.0 Triglyceride is over 400; calculations on Lipids are invalid.   HDL 32.40* 12/02/2014   LDLDIRECT 93.0 12/02/2014   LDLCALC 94 06/17/2014   ALT 14 05/04/2015   AST 15 05/04/2015   NA 137 05/18/2015   K 4.1 05/18/2015   CL 103 01/31/2015   CREATININE 1.2 05/18/2015   BUN 21.9 05/18/2015   CO2 29 05/18/2015   TSH 2.50 12/02/2014   INR 1.16 09/10/2011   HGBA1C 9.2 06/05/2015   EUS: Endoscopic findings: 1. The GE junction was  nodular, ulcerated and friable. There was a mild stricture that allowed passage of the larger diameter radial echoendoscope with only mild resistance. 2. The upper GI tract was otherwise normal EUS findings: 1. There was obvious hypoechoic, non-circumferential, eccentric mass at the GE junction. This was approximately 4 cm long and the bulk of the mass lay on the gastric side of the GE junction. The mass was 1.2 cm thick maximally. It was 3.7 cm across. The mass clearly invades into and through muscularis propria (uT3). 2. There was a small but suspicious paraesophageal lymph node located 2 cm proximal to the mass this was 6.4 mm in diameter, round, hypoechoic, discrete (uN1). ENDOSCOPIC IMPRESSION: 4cm long, 3.7cm across eccentric (non-circumferential) uT3N1 (Stage IIIA) GE junction adenocarinoma. The bulk of the tumor lays on the gastric side of the GE junction.  PATH: Patient: Ronald, Johnson Collected: 03/08/2015 Client: [place] Accession: [id number] Diagnosis Surgical [P], distal esophagus - ADENOCARCINOMA, SEE COMMENT. Microscopic Comment The adenocarcinoma involves at least lamina propria and is arising in the background of intestinal metaplasia associated high grade glandular dysplasia. The case is reviewed with Dr Lyndon Code who concurs. The case was discussed with Dr. Henrene Pastor on 03/13/15. (CRR:gt, 03/13/15) [place] RUND DO   2007: 1. ESOPHAGUS, BIOPSY: ESOPHAGEAL MUCOSA WITH INTESTINAL METAPLASIA CONSISTENT WITH [name]' S MUCOSA AND FOCAL GLANDULAR ATYPIA CONSISTENT WITH LOW GRADE GLANDULAR DYSPLASIA. NO HIGH GRADE DYSPLASIA OR MALIGNANCY IDENTIFIED.  2013: 5. Surgical [P], esophagus, bx - SQUAMOCOLUMNAR MUCOSA WITH SQUAMOUS EPITHELIAL CHANGES CONSISTENT WITH REFLUX RELATED INJURY. - NEGATIVE FOR INTESTINAL METAPLASIA WITH GOBLET CELLS ([name]'S ESOPHAGUS). - NEGATIVE FOR MORPHOLOGICAL FEATURES OF EOSINOPHILIC ESOPHAGITIS. - REACTIVE AND INFLAMED  GLANDULAR/COLUMNAR MUCOSA.  Assessment / Plan:   1) 4cm long, 3.7cm across eccentric (non-circumferential) uT3N1 (Stage IIIA) GE junction adenocarinoma, Seward type III associated with [name]'s .  Patient now has completed 5 cycles of chemotherapy and  radiation therapy . I have recommended proceeding with  transhiatal total esophagectomy and cervical esophagogastrostomy and feeding jejunostomy tube in Dec5 Tuesday  I had a detailed discussion with Ronald Johnson regarding the magnitude of the surgical esophagectomy procedure as well as the risks, the expected benefits, and alternatives.  I quoted Ronald Johnson 5% perioperative mortality rate and a complication rate as high as 40%.  We specifically discussed complications, which include, but were not limited to: recurrent nerve injury with possible permanent hoarseness, anastomotic leak, airway and great vessel injury, conduit ischemia, thoracic duct leak, the inability to complete the operation via a transhiatal approach requiring a right thoracotomy,  bleeding, need for blood transfusion and the potential need for ventilator support.  [employer]  C Johnson has had questions answered is well informed and willing to proceed.  2) known COPD, full PFTs done 3) isotope stress test was done recently without evidence of ischemia,      I  spent 40 minutes counseling the patient face to face and 50% or more the  time was spent in counseling and coordination of care. The total time spent in the appointment was 60 minutes.  [name] MD      [address] [place] [address] Office [phone]   Murlean Hark [phone]  07/20/2015 1:18 PM   Addendum: I reviewed the CT scan with the radiologist and question soft finding of possible incidentally noted left lower lobe subsegmental clot, although this study was not specifically time to rule out the . We did proceed after his visit in the office today to get a lower extremity duplex  scan which ruled out any evidence of DVT. This point we'll continue with the above outlined plan for surgery.

## 2015-07-20 NOTE — Progress Notes (Addendum)
Anesthesia Chart Review: Patient is a 69 year old male scheduled for transhiatal total esophagectomy complete on 07/25/15 (first case) by Dr. Servando Johnson. PAT is scheduled for 07/24/15 at 3 PM. Dr. Servando Johnson is requesting that a "NIMB" (NIM) tube be used.   History includes esophageal cancer (diagnosed 03/08/15 clinical stage IIb, final week of Taxol/carboplatin 05/18/15, completed radiation 05/29/15), former smoker, emphysema/COPD, DM on insulin-requiring type 2 (diagnosed '93), anemia, hypercholesterolemia, HTN, depression, anxiety, GERD, hiatal hernia, ED, arthritis. PCP ordered GXT 01/06/15, but resting EKG had inferolateral ST/T wave changes, so Ronald Merle, NP with [facility] ordered a Ronald Johnson which was low risk on 01/19/15 (see below). Noted today's chest CT showed acute segmental PE within LLL. This has been called to Dr. Servando Johnson by radiology. BLE venous duplex results still pending.  PCP/Endocrinologist is Dr. Loanne Johnson. HEM-ONC is Dr. Benay Johnson. RAD-ONC is Dr. Lisbeth Johnson. Pulmonologist is Dr. Elsworth Johnson.   Meds include Lipitor, Valium, Nexium, Humulin N, Hyzaar, Percocet, sildenafil, Effexor-XR.  01/19/15 Nuclear stress test: Myocardial perfusion is normal. Small mild basal inferior fixed attenuation artifact. No reversible ischemia. This is a low risk study. Overall left ventricular systolic function was normal. LV cavity size is normal. The left ventricular ejection fraction is mildly decreased (51%). There is no prior study for comparison.  04/04/15 PFTs: FVC 4.46 (79%), FEV1 2.27 (54%), DLCOunc 20.50 (50%).  07/20/15 CT chest/abd/pelvis: IMPRESSION: 1. Incidentally detected acute segmental pulmonary embolus within the left lower lobe. No secondary evidence of pulmonary arterial hypertension or right heart strain. 2. Stable nonspecific mild circumferential wall thickening in the lower thoracic esophagus extending into the proximal gastric cardia, which could represent post treatment change or residual  neoplasm. 3. No evidence of metastatic disease in the chest, abdomen or pelvis. 4. Three-vessel coronary atherosclerosis. Critical Value/emergent results were called by telephone at the time of interpretation on 07/20/2015 at 1:29 pm to Dr. Lanelle Johnson , who verbally acknowledged these results.  07/20/15 BLE venous duplex: PENDING.  Preoperative EKG, CXR, labs PENDING his PAT visit. A1C on 06/05/15 was 9.2.  I will plan to follow-up with [facility] staff regarding Ronald Johnson plan for PE management.   Ronald Johnson [facility] Short Stay Center/Anesthesiology Phone [phone] 07/20/2015 5:26 PM  Addendum:  07/20/15 BLE venous duplex: No evidence of BLE DVT or superficial venous thrombus or incompetence.    After review of chest CT and BLE venous duplex results Dr. Servando Johnson wrote, "I reviewed the CT scan with the radiologist and question soft finding of possible incidentally noted left lower lobe subsegmental clot, although this study was not specifically time to rule out the . We did proceed after his visit in the office today to get a lower extremity duplex scan which ruled out any evidence of DVT. This point we'll continue with the above outlined plan for surgery." I have notified anesthesiologists Dr. Kalman Johnson and Dr. Conrad Johnson about CT/duplex findings and that Dr. Servando Johnson was aware and planned to proceed. No new recommendations at this time. Additional follow-up pending his PAT visit.  Ronald Johnson [facility] Short Stay Center/Anesthesiology Phone [phone] 07/21/2015 2:37 PM  Addendum: PAT results noted as available.    07/24/15 EKG: NSR, non-specific ST/T wave abnormality.  07/24/15 CXR: Report pending.  Preoperative labs noted.   Ronald Johnson [facility] Short Stay Center/Anesthesiology Phone [phone] 07/24/2015 5:01 PM

## 2015-07-21 ENCOUNTER — Telehealth: Payer: Self-pay | Admitting: Oncology

## 2015-07-21 ENCOUNTER — Ambulatory Visit (HOSPITAL_BASED_OUTPATIENT_CLINIC_OR_DEPARTMENT_OTHER): Payer: Medicare Other | Admitting: Oncology

## 2015-07-21 ENCOUNTER — Encounter: Payer: Self-pay | Admitting: Oncology

## 2015-07-21 VITALS — BP 111/61 | HR 75 | Temp 97.8°F | Resp 19 | Ht 76.0 in | Wt 256.7 lb

## 2015-07-21 DIAGNOSIS — I2699 Other pulmonary embolism without acute cor pulmonale: Secondary | ICD-10-CM

## 2015-07-21 DIAGNOSIS — C160 Malignant neoplasm of cardia: Secondary | ICD-10-CM | POA: Diagnosis not present

## 2015-07-21 NOTE — Telephone Encounter (Signed)
Gave patient avs report and appointments for January  °

## 2015-07-21 NOTE — Progress Notes (Signed)
[facility] OFFICE PROGRESS NOTE   Diagnosis:  Esophagus cancer  INTERVAL HISTORY:   Mr. Schenone returns as scheduled. The pain over his left lower chest/upper abdominal region has improved. He is no longer taking pain medication on a regular basis. Pain is not worse with inspiration. He denies any reflux symptoms. No nausea or vomiting. He has constipation which he relates to the pain medication. He takes milk of magnesia as needed with good results. He was seen by Dr. Servando Snare yesterday with plans for surgery on 07/25/2015.  Objective:  Vital signs in last 24 hours:  Blood pressure 111/61, pulse 75, temperature 97.8 F (36.6 C), temperature source Oral, resp. rate 19, height 6\' 4"  (1.93 m), weight 256 lb 11.2 oz (116.438 kg), SpO2 97 %.    HEENT: No thrush or ulcers. Lymphatics: No palpable cervical or supclavicular lymph nodes. Resp: Distant breath sounds. Cardio: Regular rate and rhythm. GI: Abdomen soft and nontender. No mass. No organomegaly. Vascular: No leg edema.    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 EXAM: CT CHEST, ABDOMEN, AND PELVIS WITH CONTRAST TECHNIQUE: Multidetector CT imaging of the chest, abdomen and pelvis was performed following the standard protocol during bolus administration of intravenous contrast. CONTRAST:  113mL ISOVUE-300 IOPAMIDOL (ISOVUE-300) INJECTION 61% COMPARISON:  04/12/2015 PET-CT. 03/27/2015 chest CT. 02/17/2015 CT abdomen. FINDINGS: CT CHEST FINDINGS Mediastinum/Nodes: Normal heart size. No pericardial fluid/thickening. Left anterior descending,  left circumflex and right coronary atherosclerosis. Great vessels are normal in course and caliber. There is an acute segmental pulmonary embolus within the anterior left lower lobe (series 3/image 41 and series 601/ image 82). No additional central pulmonary emboli are detected. Normal visualized thyroid. There is nonspecific circumferential mild wall thickening in the lower thoracic esophagus extending into the proximal gastric cardia, not definitely changed since 03/27/2015. No axillary adenopathy. Stable borderline enlarged 1.0 cm left subcarinal node (series 3/ image 35), stable since 03/27/2015. No new mediastinal adenopathy. No hilar adenopathy. Lungs/Pleura: No pneumothorax. No pleural effusion. There are 6 scattered subcentimeter pulmonary nodules (1 in the posterior right upper lobe, 2 in the right lower lobe and 3 in the left lower lobe), largest 6 mm in the right lower lobe (series 4/ image 39), all unchanged since 05/14/2012, in keeping with a benign etiology. No acute consolidative airspace disease, new significant pulmonary nodules or lung masses. Musculoskeletal: No aggressive appearing focal osseous lesions. Mild-to-moderate degenerative changes in the thoracic spine. Stable mild symmetric gynecomastia. CT ABDOMEN PELVIS FINDINGS Hepatobiliary: Normal liver with no liver mass. Normal gallbladder with no radiopaque cholelithiasis. No biliary ductal dilatation. Pancreas: Normal, with no mass or duct dilation. Spleen: Normal size. No mass. Adrenals/Urinary Tract: Normal right adrenal. Mild irregular thickening of the left adrenal gland up to 1.0 cm in thickness, unchanged since 05/14/12, and benign. Simple 1.6 cm upper right renal cyst. Simple 3.5 cm lateral interpolar right renal cyst. Simple 1.8 cm medial right lower renal cyst. Simple 2.9 cm anterior interpolar left renal cyst. A few additional subcentimeter hypodense renal lesions scattered throughout both kidneys are too small to characterize and  not appreciably  changed. No hydronephrosis. Normal bladder. Stomach/Bowel: No new sites of wall thickening in the nondistended stomach. Normal caliber small bowel with no small bowel wall thickening. Stable small periampullary duodenal diverticulum. Normal appendix. Normal large bowel with no diverticulosis, large bowel wall thickening or pericolonic fat stranding. Vascular/Lymphatic: Atherosclerotic nonaneurysmal abdominal aorta. Patent portal, splenic, hepatic and renal veins. No pathologically enlarged lymph nodes in the abdomen or pelvis. Reproductive: Stable top-normal size prostate with nonspecific internal prostatic calcification. Other: No pneumoperitoneum, ascites or focal fluid collection. Musculoskeletal: No aggressive appearing focal osseous lesions. Mild degenerative changes in the lumbar spine. IMPRESSION: 1. Incidentally detected acute segmental pulmonary embolus within the left lower lobe. No secondary evidence of pulmonary arterial hypertension or right heart strain. 2. Stable nonspecific mild circumferential wall thickening in the lower thoracic esophagus extending into the proximal gastric cardia, which could represent post treatment change or residual neoplasm. 3. No evidence of metastatic disease in the chest, abdomen or pelvis. 4. Three-vessel coronary atherosclerosis. Critical Value/emergent results were called by telephone at the time of interpretation on 07/20/2015 at 1:29 pm to Dr. Lanelle Bal , who verbally acknowledged these results. Electronically Signed   By: Ilona Sorrel M.D.   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 EXAM: CT CHEST, ABDOMEN, AND PELVIS WITH CONTRAST TECHNIQUE: Multidetector CT imaging of the chest, abdomen and pelvis was performed following the standard  protocol during bolus administration of intravenous contrast. CONTRAST:  154mL ISOVUE-300 IOPAMIDOL (ISOVUE-300) INJECTION 61% COMPARISON:  04/12/2015 PET-CT. 03/27/2015 chest CT. 02/17/2015 CT abdomen. FINDINGS: CT CHEST FINDINGS Mediastinum/Nodes: Normal heart size. No pericardial fluid/thickening. Left anterior descending, left circumflex and right coronary atherosclerosis. Great vessels are normal in course and caliber. There is an acute segmental pulmonary embolus within the anterior left lower lobe (series 3/image 41 and series 601/ image 82). No additional central pulmonary emboli are detected. Normal visualized thyroid. There is nonspecific circumferential mild wall thickening in the lower thoracic esophagus extending into the proximal gastric cardia, not definitely changed since 03/27/2015. No axillary adenopathy. Stable borderline enlarged 1.0 cm left subcarinal node (series 3/ image 35), stable since 03/27/2015. No new mediastinal adenopathy. No hilar adenopathy. Lungs/Pleura: No pneumothorax. No pleural effusion. There are 6 scattered subcentimeter pulmonary nodules (1 in the posterior right upper lobe, 2 in the right lower lobe and 3 in the left lower lobe), largest 6 mm in the right lower lobe (series 4/ image 39), all unchanged since 05/14/2012, in keeping with a benign etiology. No acute consolidative airspace disease, new significant pulmonary nodules or lung masses. Musculoskeletal: No aggressive appearing focal osseous lesions. Mild-to-moderate degenerative changes in the thoracic spine. Stable mild symmetric gynecomastia. CT ABDOMEN PELVIS FINDINGS Hepatobiliary: Normal liver with no liver mass. Normal gallbladder with no radiopaque cholelithiasis. No biliary ductal dilatation. Pancreas: Normal, with no mass or duct dilation. Spleen: Normal size. No mass. Adrenals/Urinary Tract: Normal right adrenal. Mild irregular thickening of the left adrenal gland up to 1.0 cm in thickness, unchanged since  05/14/12, and benign. Simple 1.6 cm upper right renal cyst. Simple 3.5 cm lateral interpolar right renal cyst. Simple 1.8 cm medial right lower renal cyst. Simple 2.9 cm anterior interpolar left renal cyst. A few additional subcentimeter hypodense renal lesions scattered throughout both kidneys are too small to characterize and not appreciably changed. No hydronephrosis. Normal bladder. Stomach/Bowel: No  new sites of wall thickening in the nondistended stomach. Normal caliber small bowel with no small bowel wall thickening. Stable small periampullary duodenal diverticulum. Normal appendix. Normal large bowel with no diverticulosis, large bowel wall thickening or pericolonic fat stranding. Vascular/Lymphatic: Atherosclerotic nonaneurysmal abdominal aorta. Patent portal, splenic, hepatic and renal veins. No pathologically enlarged lymph nodes in the abdomen or pelvis. Reproductive: Stable top-normal size prostate with nonspecific internal prostatic calcification. Other: No pneumoperitoneum, ascites or focal fluid collection. Musculoskeletal: No aggressive appearing focal osseous lesions. Mild degenerative changes in the lumbar spine. IMPRESSION: 1. Incidentally detected acute segmental pulmonary embolus within the left lower lobe. No secondary evidence of pulmonary arterial hypertension or right heart strain. 2. Stable nonspecific mild circumferential wall thickening in the lower thoracic esophagus extending into the proximal gastric cardia, which could represent post treatment change or residual neoplasm. 3. No evidence of metastatic disease in the chest, abdomen or pelvis. 4. Three-vessel coronary atherosclerosis. Critical Value/emergent results were called by telephone at the time of interpretation on 07/20/2015 at 1:29 pm to Dr. Lanelle Bal , who verbally acknowledged these results. Electronically Signed   By: Ilona Sorrel M.D.   On: 07/20/2015 13:41    Medications: I have reviewed the patient's current  medications.  Assessment/Plan: 1. GE junction carcinoma-adenocarcinoma, status post an endoscopic biopsy 03/08/2015  EUS 03/16/2015 confirmed a clinical stage IIb (uT3,uN1) tumor  Staging CTs of the chest, abdomen, and pelvis with no evidence of metastatic disease  Initiation of radiation 04/19/2015, cycle 1 Taxol/carboplatin 04/20/2015: Radiation completed 05/29/2015; the fifth and final week of Taxol/carboplatin 05/18/2015  Restaging CTs 07/20/2015 revealed no evidence of metastatic disease, incidental left lower lobe pulmonary embolism  2. Postprandial subxiphoid pain secondary to #1  3. Diabetes-elevated blood sugar readings at the [facility] and at home  4. COPD  5. Depression  6. Hypertension  7. History of adenomatous colon polyps  8. History of diabetic related neuropathy, peripheral arterial disease, and nephropathy  9. Nausea-likely related to radiation esophagitis/gastritis  10. Incidental pulmonary embolism noted on the CT 07/20/2015  Disposition: Mr. Hutley has completed the course of concurrent radiation and chemotherapy. Pain has improved.  Restaging CT scans were performed 07/20/2015. Plan is for surgery on 07/25/2015. Noted on the CT scan is an incidental finding of an acute segmental pulmonary embolus in the left lower lobe. Dr. Everrett Coombe note has been reviewed and we will call his office later today to discuss this finding.  He will return for a follow-up visit here in early January 2017 to review the pathology report.  Patient seen and discussed with Dr. [name].    Mikey Bussing, DNP, AGPCNP-BC, AOCNP   07/21/2015  12:50 PM  This was a shared visit with Mikey Bussing. The postprandial pain has resolved. I discussed the case with Dr. Servando Snare. He will decide on anticoagulation therapy surrounding the planned esophagectomy procedure.  I will see Mr. Jaquith following surgery.  Julieanne Manson, M.D.

## 2015-07-24 ENCOUNTER — Ambulatory Visit (HOSPITAL_COMMUNITY)
Admission: RE | Admit: 2015-07-24 | Discharge: 2015-07-24 | Disposition: A | Discharge status: Home / Self Care | Payer: Medicare Other | Source: Ambulatory Visit | Attending: Cardiothoracic Surgery | Admitting: Cardiothoracic Surgery

## 2015-07-24 ENCOUNTER — Other Ambulatory Visit: Payer: Self-pay

## 2015-07-24 ENCOUNTER — Encounter (HOSPITAL_COMMUNITY)
Admission: RE | Admit: 2015-07-24 | Discharge: 2015-07-24 | Disposition: A | Discharge status: Home / Self Care | Payer: Medicare Other | Source: Ambulatory Visit | Attending: Cardiothoracic Surgery | Admitting: Cardiothoracic Surgery

## 2015-07-24 ENCOUNTER — Encounter (HOSPITAL_COMMUNITY): Payer: Self-pay

## 2015-07-24 VITALS — BP 111/74 | HR 102 | Temp 98.1°F | Resp 18 | Ht 76.0 in | Wt 250.9 lb

## 2015-07-24 DIAGNOSIS — D62 Acute posthemorrhagic anemia: Secondary | ICD-10-CM | POA: Diagnosis not present

## 2015-07-24 DIAGNOSIS — Z01812 Encounter for preprocedural laboratory examination: Secondary | ICD-10-CM | POA: Insufficient documentation

## 2015-07-24 DIAGNOSIS — I10 Essential (primary) hypertension: Secondary | ICD-10-CM | POA: Insufficient documentation

## 2015-07-24 DIAGNOSIS — K449 Diaphragmatic hernia without obstruction or gangrene: Secondary | ICD-10-CM | POA: Insufficient documentation

## 2015-07-24 DIAGNOSIS — M1990 Unspecified osteoarthritis, unspecified site: Secondary | ICD-10-CM | POA: Insufficient documentation

## 2015-07-24 DIAGNOSIS — F419 Anxiety disorder, unspecified: Secondary | ICD-10-CM

## 2015-07-24 DIAGNOSIS — F329 Major depressive disorder, single episode, unspecified: Secondary | ICD-10-CM | POA: Insufficient documentation

## 2015-07-24 DIAGNOSIS — Z794 Long term (current) use of insulin: Secondary | ICD-10-CM | POA: Insufficient documentation

## 2015-07-24 DIAGNOSIS — E119 Type 2 diabetes mellitus without complications: Secondary | ICD-10-CM

## 2015-07-24 DIAGNOSIS — D649 Anemia, unspecified: Secondary | ICD-10-CM | POA: Insufficient documentation

## 2015-07-24 DIAGNOSIS — J439 Emphysema, unspecified: Secondary | ICD-10-CM | POA: Insufficient documentation

## 2015-07-24 DIAGNOSIS — C160 Malignant neoplasm of cardia: Secondary | ICD-10-CM

## 2015-07-24 DIAGNOSIS — E7800 Pure hypercholesterolemia, unspecified: Secondary | ICD-10-CM

## 2015-07-24 DIAGNOSIS — K2270 Barrett's esophagus without dysplasia: Secondary | ICD-10-CM

## 2015-07-24 DIAGNOSIS — Z01810 Encounter for preprocedural cardiovascular examination: Secondary | ICD-10-CM

## 2015-07-24 DIAGNOSIS — Z01818 Encounter for other preprocedural examination: Secondary | ICD-10-CM

## 2015-07-24 DIAGNOSIS — R9431 Abnormal electrocardiogram [ECG] [EKG]: Secondary | ICD-10-CM | POA: Insufficient documentation

## 2015-07-24 DIAGNOSIS — K219 Gastro-esophageal reflux disease without esophagitis: Secondary | ICD-10-CM

## 2015-07-24 DIAGNOSIS — J449 Chronic obstructive pulmonary disease, unspecified: Secondary | ICD-10-CM

## 2015-07-24 DIAGNOSIS — Z87891 Personal history of nicotine dependence: Secondary | ICD-10-CM

## 2015-07-24 DIAGNOSIS — C155 Malignant neoplasm of lower third of esophagus: Secondary | ICD-10-CM | POA: Diagnosis not present

## 2015-07-24 DIAGNOSIS — C159 Malignant neoplasm of esophagus, unspecified: Secondary | ICD-10-CM

## 2015-07-24 DIAGNOSIS — D696 Thrombocytopenia, unspecified: Secondary | ICD-10-CM | POA: Diagnosis not present

## 2015-07-24 LAB — COMPREHENSIVE METABOLIC PANEL
ALT: 26 U/L (ref 17–63)
AST: 32 U/L (ref 15–41)
Albumin: 4.1 g/dL (ref 3.5–5.0)
Alkaline Phosphatase: 108 U/L (ref 38–126)
Anion gap: 8 (ref 5–15)
BUN: 15 mg/dL (ref 6–20)
CO2: 26 mmol/L (ref 22–32)
Calcium: 9.7 mg/dL (ref 8.9–10.3)
Chloride: 102 mmol/L (ref 101–111)
Creatinine, Ser: 1.24 mg/dL (ref 0.61–1.24)
GFR calc Af Amer: >60 mL/min (ref >60)
GFR calc non Af Amer: 58 mL/min — ABNORMAL LOW (ref >60)
Glucose, Bld: 145 mg/dL — ABNORMAL HIGH (ref 65–99)
Potassium: 4.1 mmol/L (ref 3.5–5.1)
Sodium: 136 mmol/L (ref 135–145)
Total Bilirubin: 0.5 mg/dL (ref 0.3–1.2)
Total Protein: 7.2 g/dL (ref 6.5–8.1)

## 2015-07-24 LAB — ABO/RH: ABO/RH(D): A POS

## 2015-07-24 LAB — CBC
HCT: 35.8 % — ABNORMAL LOW (ref 39.0–52.0)
Hemoglobin: 12.1 g/dL — ABNORMAL LOW (ref 13.0–17.0)
MCH: 32.7 pg (ref 26.0–34.0)
MCHC: 33.8 g/dL (ref 30.0–36.0)
MCV: 96.8 fL (ref 78.0–100.0)
Platelets: 161 10*3/uL (ref 150–400)
RBC: 3.7 MIL/uL — ABNORMAL LOW (ref 4.22–5.81)
RDW: 13.2 % (ref 11.5–15.5)
WBC: 7.4 10*3/uL (ref 4.0–10.5)

## 2015-07-24 LAB — BLOOD GAS, ARTERIAL
Acid-Base Excess: 0.6 mmol/L (ref 0.0–2.0)
Bicarbonate: 24.3 mEq/L — ABNORMAL HIGH (ref 20.0–24.0)
Drawn by: 242311
FIO2: 0.21
O2 Saturation: 94.5 %
Patient temperature: 98.6
TCO2: 25.4 mmol/L (ref 0–100)
pCO2 arterial: 36.1 mmHg (ref 35.0–45.0)
pH, Arterial: 7.443 (ref 7.350–7.450)
pO2, Arterial: 75.5 mmHg — ABNORMAL LOW (ref 80.0–100.0)

## 2015-07-24 LAB — APTT: aPTT: 30 seconds (ref 24–37)

## 2015-07-24 LAB — URINALYSIS, ROUTINE W REFLEX MICROSCOPIC
Bilirubin Urine: NEGATIVE
Glucose, UA: NEGATIVE mg/dL
Hgb urine dipstick: NEGATIVE
Ketones, ur: NEGATIVE mg/dL
Leukocytes, UA: NEGATIVE
Nitrite: NEGATIVE
Protein, ur: NEGATIVE mg/dL
Specific Gravity, Urine: 1.01 (ref 1.005–1.030)
pH: 6 (ref 5.0–8.0)

## 2015-07-24 LAB — PROTIME-INR
INR: 1.08 (ref 0.00–1.49)
Prothrombin Time: 14.2 seconds (ref 11.6–15.2)

## 2015-07-24 LAB — SURGICAL PCR SCREEN
MRSA, PCR: NEGATIVE
Staphylococcus aureus: NEGATIVE

## 2015-07-24 LAB — GLUCOSE, CAPILLARY: Glucose-Capillary: 145 mg/dL — ABNORMAL HIGH (ref 65–99)

## 2015-07-24 MED ORDER — DEXTROSE 5 % IV SOLN
2.0000 g | INTRAVENOUS | Status: AC
  Administered 2015-07-25 (×4): 2 g via INTRAVENOUS
  Filled 2015-07-24: qty 2

## 2015-07-24 NOTE — Progress Notes (Signed)
Patient stated he was instructed by Dr. Juanda Chance not to take any insulin am of surgery.    Spoke with Willeen Cass PA re HGB A1C 9.2 in October.

## 2015-07-24 NOTE — Pre-Procedure Instructions (Signed)
Ronald Johnson  07/24/2015      [facility] - [place], [place] [address] Phone: [phone] Fax: [phone]  [place], [place] [address] [place] [address] [place] [address] Phone: [phone] Fax: [place], [place] [address] Phone: [phone] Fax: [phone]  [facility] [place], [place] - [address] [place] [address] Phone: [phone] Fax: [phone]    Your procedure is scheduled on   07/25/15    Tuesday    Report to [facility] at 530 A.M.  Call this number if you have problems the morning of surgery:  [phone]   Remember:  Do not eat food or drink liquids after midnight.  Take these medicines the morning of surgery with A SIP OF WATER    DIAZEPAM, NEXIUM, EFFEXOR   How to Manage Your Diabetes Before Surgery   Why is it important to control my blood sugar before and after surgery?   Improving blood sugar levels before and after surgery helps healing and can limit problems.  A way of improving blood sugar control is eating a healthy diet by:  - Eating less sugar and carbohydrates  - Increasing activity/exercise  - Talk with your doctor about reaching your blood sugar goals  High blood sugars (greater than 180 mg/dL) can raise your risk of infections and slow down your recovery so you will need to focus on controlling your diabetes during the weeks before surgery.  Make sure that the doctor who takes care of your diabetes knows about your planned surgery including the date and location.  How do I manage my blood sugars before surgery?   Check your blood sugar at least 4 times a day, 2 days before surgery to make sure that they  are not too high or low.   Check your blood sugar the morning of your surgery when you wake up and every 2               hours until you get to the Short-Stay unit.  If your blood sugar is less than 70 mg/dL, you will need to treat for low blood sugar by:  Treat a low blood sugar (less than 70 mg/dL) with 1/2 cup of clear juice (cranberry or apple), 4 glucose tablets, OR glucose gel.  Recheck blood sugar in 15 minutes after treatment (to make sure it is greater than 70 mg/dL).  If blood sugar is not greater than 70 mg/dL on re-check, call [phone] for further instructions.   Report your blood sugar to the Short-Stay nurse when you get to Short-Stay.  References:  [facility], 2007 "How to Manage your Diabetes Before and After Surgery".  What do I do about my diabetes medications?   Do not take oral diabetes medicines (pills) the morning of surGERY    For patients with "Insulin Pumps":  Contact your diabetes doctor for specific instructions before surgery.   Decrease basal insulin rates by 20% at midnight the night before surgery.  Note that if your surgery is planned to be longer than 2 hours, your insulin pump will be removed and intravenous (IV) insulin will be started and managed by the nurses and  anesthesiologist.  You will be able to restart your insulin pump once you are awake and able to manage it.  Make sure to bring insulin pump supplies to the hospital with you in case your site needs to be changed.        Do not wear jewelry, make-up or nail polish.  Do not wear lotions, powders, or perfumes.  You may wear deodorant.  Do not shave 48 hours prior to surgery.  Men may shave face and neck.  Do not bring valuables to the hospital.  [facility] is not responsible for any belongings or valuables.  Contacts, dentures or bridgework may not be worn into surgery.  Leave your suitcase in the car.  After surgery it may be brought to your  room.  For patients admitted to the hospital, discharge time will be determined by your treatment team.  Patients discharged the day of surgery will not be allowed to drive home.   Name and phone number of your driver:   Special instructions:  See preparing for surgery handout  Please read over the following fact sheets that you were given. Pain Booklet, Coughing and Deep Breathing, Blood Transfusion Information, MRSA Information and Surgical Site Infection Prevention

## 2015-07-25 ENCOUNTER — Inpatient Hospital Stay (HOSPITAL_COMMUNITY): Payer: Medicare Other

## 2015-07-25 ENCOUNTER — Encounter (HOSPITAL_COMMUNITY): Payer: Self-pay | Admitting: General Practice

## 2015-07-25 ENCOUNTER — Inpatient Hospital Stay (HOSPITAL_COMMUNITY)
Admission: RE | Admit: 2015-07-25 | Discharge: 2015-08-06 | DRG: 327 | Disposition: A | Discharge status: Home Health | Payer: Medicare Other | Source: Ambulatory Visit | Attending: Cardiothoracic Surgery | Admitting: Cardiothoracic Surgery

## 2015-07-25 ENCOUNTER — Inpatient Hospital Stay (HOSPITAL_COMMUNITY): Payer: Medicare Other | Admitting: Certified Registered"

## 2015-07-25 ENCOUNTER — Encounter (HOSPITAL_COMMUNITY)
Admission: RE | Disposition: A | Discharge status: Home Health | Payer: Self-pay | Source: Ambulatory Visit | Attending: Cardiothoracic Surgery

## 2015-07-25 ENCOUNTER — Inpatient Hospital Stay (HOSPITAL_COMMUNITY): Payer: Medicare Other | Admitting: Vascular Surgery

## 2015-07-25 DIAGNOSIS — Z9689 Presence of other specified functional implants: Secondary | ICD-10-CM

## 2015-07-25 DIAGNOSIS — E109 Type 1 diabetes mellitus without complications: Secondary | ICD-10-CM | POA: Diagnosis present

## 2015-07-25 DIAGNOSIS — Z87891 Personal history of nicotine dependence: Secondary | ICD-10-CM | POA: Diagnosis not present

## 2015-07-25 DIAGNOSIS — M799 Soft tissue disorder, unspecified: Secondary | ICD-10-CM | POA: Diagnosis not present

## 2015-07-25 DIAGNOSIS — Z923 Personal history of irradiation: Secondary | ICD-10-CM

## 2015-07-25 DIAGNOSIS — K449 Diaphragmatic hernia without obstruction or gangrene: Secondary | ICD-10-CM | POA: Diagnosis present

## 2015-07-25 DIAGNOSIS — R1310 Dysphagia, unspecified: Secondary | ICD-10-CM | POA: Diagnosis present

## 2015-07-25 DIAGNOSIS — R339 Retention of urine, unspecified: Secondary | ICD-10-CM | POA: Diagnosis not present

## 2015-07-25 DIAGNOSIS — D62 Acute posthemorrhagic anemia: Secondary | ICD-10-CM | POA: Diagnosis not present

## 2015-07-25 DIAGNOSIS — J989 Respiratory disorder, unspecified: Secondary | ICD-10-CM

## 2015-07-25 DIAGNOSIS — J9811 Atelectasis: Secondary | ICD-10-CM | POA: Diagnosis not present

## 2015-07-25 DIAGNOSIS — R338 Other retention of urine: Secondary | ICD-10-CM | POA: Diagnosis present

## 2015-07-25 DIAGNOSIS — J449 Chronic obstructive pulmonary disease, unspecified: Secondary | ICD-10-CM | POA: Diagnosis present

## 2015-07-25 DIAGNOSIS — I10 Essential (primary) hypertension: Secondary | ICD-10-CM | POA: Diagnosis present

## 2015-07-25 DIAGNOSIS — J90 Pleural effusion, not elsewhere classified: Secondary | ICD-10-CM | POA: Diagnosis not present

## 2015-07-25 DIAGNOSIS — M1990 Unspecified osteoarthritis, unspecified site: Secondary | ICD-10-CM | POA: Diagnosis not present

## 2015-07-25 DIAGNOSIS — Z4682 Encounter for fitting and adjustment of non-vascular catheter: Secondary | ICD-10-CM | POA: Diagnosis not present

## 2015-07-25 DIAGNOSIS — Z452 Encounter for adjustment and management of vascular access device: Secondary | ICD-10-CM | POA: Diagnosis not present

## 2015-07-25 DIAGNOSIS — C159 Malignant neoplasm of esophagus, unspecified: Secondary | ICD-10-CM | POA: Diagnosis present

## 2015-07-25 DIAGNOSIS — Z9221 Personal history of antineoplastic chemotherapy: Secondary | ICD-10-CM

## 2015-07-25 DIAGNOSIS — R05 Cough: Secondary | ICD-10-CM | POA: Diagnosis not present

## 2015-07-25 DIAGNOSIS — K2270 Barrett's esophagus without dysplasia: Secondary | ICD-10-CM | POA: Diagnosis present

## 2015-07-25 DIAGNOSIS — J9383 Other pneumothorax: Secondary | ICD-10-CM | POA: Diagnosis not present

## 2015-07-25 DIAGNOSIS — C160 Malignant neoplasm of cardia: Secondary | ICD-10-CM | POA: Diagnosis not present

## 2015-07-25 DIAGNOSIS — Z9889 Other specified postprocedural states: Secondary | ICD-10-CM

## 2015-07-25 DIAGNOSIS — K219 Gastro-esophageal reflux disease without esophagitis: Secondary | ICD-10-CM | POA: Diagnosis present

## 2015-07-25 DIAGNOSIS — Z794 Long term (current) use of insulin: Secondary | ICD-10-CM | POA: Diagnosis not present

## 2015-07-25 DIAGNOSIS — C155 Malignant neoplasm of lower third of esophagus: Secondary | ICD-10-CM | POA: Diagnosis not present

## 2015-07-25 DIAGNOSIS — D696 Thrombocytopenia, unspecified: Secondary | ICD-10-CM | POA: Diagnosis not present

## 2015-07-25 HISTORY — PX: COMPLETE ESOPHAGECTOMY: SHX5286

## 2015-07-25 HISTORY — PX: JEJUNOSTOMY: SHX313

## 2015-07-25 HISTORY — PX: VIDEO BRONCHOSCOPY: SHX5072

## 2015-07-25 LAB — POCT I-STAT 3, ART BLOOD GAS (G3+)
Acid-base deficit: 2 mmol/L (ref 0.0–2.0)
Acid-base deficit: 3 mmol/L — ABNORMAL HIGH (ref 0.0–2.0)
Acid-base deficit: 4 mmol/L — ABNORMAL HIGH (ref 0.0–2.0)
Bicarbonate: 25.2 mEq/L — ABNORMAL HIGH (ref 20.0–24.0)
Bicarbonate: 22.9 mEq/L (ref 20.0–24.0)
Bicarbonate: 22.5 mEq/L (ref 20.0–24.0)
O2 Saturation: 98 %
O2 Saturation: 98 %
O2 Saturation: 96 %
Patient temperature: 99.2
Patient temperature: 98
Patient temperature: 97.7
TCO2: 27 mmol/L (ref 0–100)
TCO2: 24 mmol/L (ref 0–100)
TCO2: 24 mmol/L (ref 0–100)
pCO2 arterial: 54 mmHg — ABNORMAL HIGH (ref 35.0–45.0)
pCO2 arterial: 42.4 mmHg (ref 35.0–45.0)
pCO2 arterial: 44.4 mmHg (ref 35.0–45.0)
pH, Arterial: 7.279 — ABNORMAL LOW (ref 7.350–7.450)
pH, Arterial: 7.34 — ABNORMAL LOW (ref 7.350–7.450)
pH, Arterial: 7.31 — ABNORMAL LOW (ref 7.350–7.450)
pO2, Arterial: 124 mmHg — ABNORMAL HIGH (ref 80.0–100.0)
pO2, Arterial: 104 mmHg — ABNORMAL HIGH (ref 80.0–100.0)
pO2, Arterial: 87 mmHg (ref 80.0–100.0)

## 2015-07-25 LAB — CBC
HCT: 27.9 % — ABNORMAL LOW (ref 39.0–52.0)
Hemoglobin: 9.4 g/dL — ABNORMAL LOW (ref 13.0–17.0)
MCH: 33.2 pg (ref 26.0–34.0)
MCHC: 33.7 g/dL (ref 30.0–36.0)
MCV: 98.6 fL (ref 78.0–100.0)
Platelets: DECREASED 10*3/uL (ref 150–400)
RBC: 2.83 MIL/uL — ABNORMAL LOW (ref 4.22–5.81)
RDW: 13.1 % (ref 11.5–15.5)
WBC: 6.9 10*3/uL (ref 4.0–10.5)

## 2015-07-25 LAB — BASIC METABOLIC PANEL
Anion gap: 7 (ref 5–15)
BUN: 17 mg/dL (ref 6–20)
CALCIUM: 8.1 mg/dL — AB (ref 8.9–10.3)
CO2: 25 mmol/L (ref 22–32)
CREATININE: 1.57 mg/dL — AB (ref 0.61–1.24)
Chloride: 105 mmol/L (ref 101–111)
GFR calc non Af Amer: 44 mL/min — ABNORMAL LOW (ref >60)
GFR, EST AFRICAN AMERICAN: 51 mL/min — AB (ref >60)
GLUCOSE: 134 mg/dL — AB (ref 65–99)
Potassium: 4.9 mmol/L (ref 3.5–5.1)
Sodium: 137 mmol/L (ref 135–145)

## 2015-07-25 LAB — POCT I-STAT 4, (NA,K, GLUC, HGB,HCT)
Glucose, Bld: 132 mg/dL — ABNORMAL HIGH (ref 65–99)
HCT: 27 % — ABNORMAL LOW (ref 39.0–52.0)
Hemoglobin: 9.2 g/dL — ABNORMAL LOW (ref 13.0–17.0)
Potassium: 4.8 mmol/L (ref 3.5–5.1)
Sodium: 137 mmol/L (ref 135–145)

## 2015-07-25 LAB — GLUCOSE, CAPILLARY
Glucose-Capillary: 136 mg/dL — ABNORMAL HIGH (ref 65–99)
Glucose-Capillary: 126 mg/dL — ABNORMAL HIGH (ref 65–99)

## 2015-07-25 LAB — PREPARE RBC (CROSSMATCH)

## 2015-07-25 SURGERY — ESOPHAGECTOMY, TOTAL
Anesthesia: General | Site: Bronchus

## 2015-07-25 MED ORDER — EPHEDRINE SULFATE 50 MG/ML IJ SOLN
INTRAMUSCULAR | Status: DC | PRN
  Administered 2015-07-25 (×5): 5 mg via INTRAVENOUS

## 2015-07-25 MED ORDER — SODIUM CHLORIDE 0.9 % IJ SOLN
INTRAMUSCULAR | Status: AC
  Filled 2015-07-25: qty 10

## 2015-07-25 MED ORDER — PHENYLEPHRINE HCL 10 MG/ML IJ SOLN: INTRAMUSCULAR | Status: DC | PRN

## 2015-07-25 MED ORDER — DEXTROSE-NACL 5-0.9 % IV SOLN
INTRAVENOUS | Status: DC
  Administered 2015-07-25 – 2015-07-26 (×3): via INTRAVENOUS
  Administered 2015-07-27: 75 mL/h via INTRAVENOUS
  Administered 2015-07-27 – 2015-07-28 (×2): via INTRAVENOUS
  Administered 2015-07-29: 50 mL/h via INTRAVENOUS
  Administered 2015-08-01: 04:00:00 via INTRAVENOUS

## 2015-07-25 MED ORDER — DEXMEDETOMIDINE HCL IN NACL 400 MCG/100ML IV SOLN
0.4000 ug/kg/h | Freq: Once | INTRAVENOUS | Status: AC
  Administered 2015-07-25: .2 ug/kg/h via INTRAVENOUS
  Filled 2015-07-25: qty 100

## 2015-07-25 MED ORDER — SODIUM CHLORIDE 0.9 % IV SOLN
Freq: Once | INTRAVENOUS | Status: DC
  Administered 2015-07-25: 5.8 [IU]/h via INTRAVENOUS
  Filled 2015-07-25: qty 2.5

## 2015-07-25 MED ORDER — MORPHINE SULFATE (PF) 2 MG/ML IV SOLN
1.0000 mg | INTRAVENOUS | Status: DC | PRN
  Administered 2015-07-25: 2 mg via INTRAVENOUS
  Administered 2015-07-25 – 2015-07-26 (×5): 4 mg via INTRAVENOUS
  Administered 2015-07-26: 2 mg via INTRAVENOUS
  Administered 2015-07-26: 4 mg via INTRAVENOUS
  Filled 2015-07-25: qty 1
  Filled 2015-07-25 (×5): qty 2
  Filled 2015-07-25: qty 1
  Filled 2015-07-25: qty 2

## 2015-07-25 MED ORDER — PHENYLEPHRINE HCL 10 MG/ML IJ SOLN
10.0000 mg | INTRAVENOUS | Status: DC | PRN
  Administered 2015-07-25 (×2): 20 ug/min via INTRAVENOUS

## 2015-07-25 MED ORDER — FENTANYL CITRATE (PF) 100 MCG/2ML IJ SOLN
INTRAMUSCULAR | Status: DC | PRN
  Administered 2015-07-25: 50 ug via INTRAVENOUS
  Administered 2015-07-25 (×2): 100 ug via INTRAVENOUS
  Administered 2015-07-25: 150 ug via INTRAVENOUS
  Administered 2015-07-25 (×2): 50 ug via INTRAVENOUS
  Administered 2015-07-25 (×3): 100 ug via INTRAVENOUS
  Administered 2015-07-25: 50 ug via INTRAVENOUS
  Administered 2015-07-25 (×2): 100 ug via INTRAVENOUS
  Administered 2015-07-25: 150 ug via INTRAVENOUS
  Administered 2015-07-25: 100 ug via INTRAVENOUS
  Administered 2015-07-25 (×2): 50 ug via INTRAVENOUS
  Administered 2015-07-25: 100 ug via INTRAVENOUS

## 2015-07-25 MED ORDER — DEXTROSE 5 % IV SOLN
2.0000 g | INTRAVENOUS | Status: DC
  Filled 2015-07-25: qty 2

## 2015-07-25 MED ORDER — LACTATED RINGERS IV SOLN
INTRAVENOUS | Status: DC | PRN
  Administered 2015-07-25: 07:00:00 via INTRAVENOUS

## 2015-07-25 MED ORDER — SODIUM CHLORIDE 0.9 % IV SOLN
INTRAVENOUS | Status: DC | PRN
  Administered 2015-07-25: 13:00:00 via INTRAVENOUS

## 2015-07-25 MED ORDER — PHENYLEPHRINE 40 MCG/ML (10ML) SYRINGE FOR IV PUSH (FOR BLOOD PRESSURE SUPPORT)
PREFILLED_SYRINGE | INTRAVENOUS | Status: AC
  Filled 2015-07-25: qty 10

## 2015-07-25 MED ORDER — ALBUMIN HUMAN 5 % IV SOLN
12.5000 g | Freq: Once | INTRAVENOUS | Status: AC
  Administered 2015-07-25: 12.5 g via INTRAVENOUS

## 2015-07-25 MED ORDER — SODIUM CHLORIDE 0.9 % IV SOLN
INTRAVENOUS | Status: DC
Start: 2015-07-25 — End: 2015-07-26
  Administered 2015-07-25: 5.2 [IU]/h via INTRAVENOUS
  Administered 2015-07-26: 2 [IU]/h via INTRAVENOUS
  Filled 2015-07-25: qty 2.5

## 2015-07-25 MED ORDER — SODIUM CHLORIDE 0.9 % IV SOLN
250.0000 [IU] | INTRAVENOUS | Status: DC | PRN
  Administered 2015-07-25: 1.5 [IU]/h via INTRAVENOUS

## 2015-07-25 MED ORDER — DEXTROSE 5 % IV SOLN
2.0000 g | Freq: Four times a day (QID) | INTRAVENOUS | Status: AC
  Administered 2015-07-25 – 2015-07-26 (×4): 2 g via INTRAVENOUS
  Filled 2015-07-25 (×4): qty 2

## 2015-07-25 MED ORDER — PHENYLEPHRINE HCL 10 MG/ML IJ SOLN
0.0000 ug/min | INTRAMUSCULAR | Status: DC
  Administered 2015-07-25: 5 ug/min via INTRAVENOUS
  Filled 2015-07-25: qty 1

## 2015-07-25 MED ORDER — CETYLPYRIDINIUM CHLORIDE 0.05 % MT LIQD
7.0000 mL | Freq: Two times a day (BID) | OROMUCOSAL | Status: DC
  Administered 2015-07-25 – 2015-08-06 (×20): 7 mL via OROMUCOSAL

## 2015-07-25 MED ORDER — ALBUMIN HUMAN 5 % IV SOLN
INTRAVENOUS | Status: DC | PRN
  Administered 2015-07-25 (×3): via INTRAVENOUS

## 2015-07-25 MED ORDER — PROPOFOL 10 MG/ML IV BOLUS
INTRAVENOUS | Status: AC
  Filled 2015-07-25: qty 20

## 2015-07-25 MED ORDER — HEMOSTATIC AGENTS (NO CHARGE) OPTIME
TOPICAL | Status: DC | PRN
  Administered 2015-07-25: 1 via TOPICAL

## 2015-07-25 MED ORDER — LIDOCAINE HCL (CARDIAC) 20 MG/ML IV SOLN
INTRAVENOUS | Status: AC
  Filled 2015-07-25: qty 5

## 2015-07-25 MED ORDER — INSULIN ASPART 100 UNIT/ML ~~LOC~~ SOLN: 0.0000 [IU] | SUBCUTANEOUS | Status: DC

## 2015-07-25 MED ORDER — CHLORHEXIDINE GLUCONATE 0.12 % MT SOLN
15.0000 mL | Freq: Two times a day (BID) | OROMUCOSAL | Status: DC
  Administered 2015-07-25 – 2015-08-05 (×20): 15 mL via OROMUCOSAL
  Filled 2015-07-25 (×13): qty 15

## 2015-07-25 MED ORDER — 0.9 % SODIUM CHLORIDE (POUR BTL) OPTIME
TOPICAL | Status: DC | PRN
  Administered 2015-07-25: 2000 mL

## 2015-07-25 MED ORDER — MIDAZOLAM HCL 2 MG/2ML IJ SOLN
2.0000 mg | INTRAMUSCULAR | Status: DC | PRN
  Administered 2015-07-25: 2 mg via INTRAVENOUS
  Filled 2015-07-25: qty 2

## 2015-07-25 MED ORDER — POTASSIUM CHLORIDE 10 MEQ/50ML IV SOLN
10.0000 meq | INTRAVENOUS | Status: DC | PRN
  Administered 2015-07-29 (×3): 10 meq via INTRAVENOUS
  Filled 2015-07-25 (×4): qty 50

## 2015-07-25 MED ORDER — CHLORHEXIDINE GLUCONATE 0.12 % MT SOLN
15.0000 mL | OROMUCOSAL | Status: AC
  Administered 2015-07-25: 15 mL via OROMUCOSAL

## 2015-07-25 MED ORDER — MIDAZOLAM HCL 5 MG/5ML IJ SOLN
INTRAMUSCULAR | Status: DC | PRN
  Administered 2015-07-25 (×2): 2 mg via INTRAVENOUS

## 2015-07-25 MED ORDER — FENTANYL CITRATE (PF) 250 MCG/5ML IJ SOLN
INTRAMUSCULAR | Status: AC
  Filled 2015-07-25: qty 5

## 2015-07-25 MED ORDER — ROCURONIUM BROMIDE 100 MG/10ML IV SOLN
INTRAVENOUS | Status: DC | PRN
  Administered 2015-07-25: 20 mg via INTRAVENOUS
  Administered 2015-07-25: 50 mg via INTRAVENOUS
  Administered 2015-07-25: 40 mg via INTRAVENOUS
  Administered 2015-07-25: 20 mg via INTRAVENOUS

## 2015-07-25 MED ORDER — ALBUMIN HUMAN 5 % IV SOLN
INTRAVENOUS | Status: AC
  Administered 2015-07-25: 12.5 g via INTRAVENOUS
  Filled 2015-07-25: qty 250

## 2015-07-25 MED ORDER — MIDAZOLAM HCL 2 MG/2ML IJ SOLN
INTRAMUSCULAR | Status: AC
  Filled 2015-07-25: qty 2

## 2015-07-25 MED ORDER — DEXMEDETOMIDINE HCL IN NACL 200 MCG/50ML IV SOLN
0.4000 ug/kg/h | INTRAVENOUS | Status: DC
  Administered 2015-07-25: 0.7 ug/kg/h via INTRAVENOUS
  Filled 2015-07-25: qty 50

## 2015-07-25 MED ORDER — SODIUM CHLORIDE 0.9 % IJ SOLN
10.0000 mL | Freq: Two times a day (BID) | INTRAMUSCULAR | Status: DC
  Administered 2015-07-25 – 2015-08-05 (×20): 10 mL via INTRAVENOUS

## 2015-07-25 MED ORDER — SODIUM CHLORIDE 0.9 % IV SOLN
INTRAVENOUS | Status: DC | PRN
  Administered 2015-07-25: 15:00:00 via INTRAVENOUS

## 2015-07-25 MED ORDER — PANTOPRAZOLE SODIUM 40 MG IV SOLR
40.0000 mg | Freq: Two times a day (BID) | INTRAVENOUS | Status: DC
  Administered 2015-07-25 – 2015-07-29 (×9): 40 mg via INTRAVENOUS
  Filled 2015-07-25 (×12): qty 40

## 2015-07-25 MED ORDER — SODIUM CHLORIDE 0.9 % IV SOLN: Freq: Once | INTRAVENOUS | Status: DC

## 2015-07-25 MED ORDER — ONDANSETRON HCL 4 MG/2ML IJ SOLN: 4.0000 mg | INTRAMUSCULAR | Status: DC | PRN

## 2015-07-25 MED ORDER — SUCCINYLCHOLINE CHLORIDE 20 MG/ML IJ SOLN
INTRAMUSCULAR | Status: AC
  Filled 2015-07-25: qty 1

## 2015-07-25 MED ORDER — ROCURONIUM BROMIDE 50 MG/5ML IV SOLN
INTRAVENOUS | Status: AC
  Filled 2015-07-25: qty 1

## 2015-07-25 MED ORDER — PROPOFOL 10 MG/ML IV BOLUS
INTRAVENOUS | Status: DC | PRN
  Administered 2015-07-25: 200 mg via INTRAVENOUS

## 2015-07-25 MED ORDER — LIDOCAINE HCL (CARDIAC) 20 MG/ML IV SOLN
INTRAVENOUS | Status: DC | PRN
  Administered 2015-07-25: 60 mg via INTRAVENOUS

## 2015-07-25 MED ORDER — ALBUTEROL SULFATE (2.5 MG/3ML) 0.083% IN NEBU: 2.5000 mg | INHALATION_SOLUTION | Freq: Four times a day (QID) | RESPIRATORY_TRACT | Status: AC | PRN

## 2015-07-25 MED ORDER — LACTATED RINGERS IV SOLN
INTRAVENOUS | Status: DC | PRN
  Administered 2015-07-25 (×2): via INTRAVENOUS

## 2015-07-25 MED ORDER — EPHEDRINE SULFATE 50 MG/ML IJ SOLN
INTRAMUSCULAR | Status: AC
  Filled 2015-07-25: qty 1

## 2015-07-25 MED ORDER — SUCCINYLCHOLINE CHLORIDE 20 MG/ML IJ SOLN
INTRAMUSCULAR | Status: DC | PRN
  Administered 2015-07-25: 80 mg via INTRAVENOUS

## 2015-07-25 SURGICAL SUPPLY — 113 items
BANDAGE HEMOSTAT MRDH 4X4 STRL (MISCELLANEOUS) IMPLANT
BNDG HEMOSTAT MRDH 4X4 STRL (MISCELLANEOUS)
CANISTER SUCTION 2500CC (MISCELLANEOUS) ×8 IMPLANT
CATH FOLEY 2WAY SLVR 18FR 30CC (CATHETERS) ×4 IMPLANT
CATH ROBINSON RED A/P 18FR (CATHETERS) ×4 IMPLANT
CATH THORACIC 28FR (CATHETERS) ×4 IMPLANT
CLIP FOGARTY SPRING 6M (CLIP) ×4 IMPLANT
CLIP TI LARGE 6 (CLIP) IMPLANT
CLIP TI MEDIUM 24 (CLIP) ×4 IMPLANT
CLIP TI WIDE RED SMALL 24 (CLIP) ×4 IMPLANT
CONT SPEC STER OR (MISCELLANEOUS) ×8 IMPLANT
COVER SURGICAL LIGHT HANDLE (MISCELLANEOUS) ×4 IMPLANT
DERMABOND ADVANCED (GAUZE/BANDAGES/DRESSINGS) ×1
DERMABOND ADVANCED .7 DNX12 (GAUZE/BANDAGES/DRESSINGS) ×3 IMPLANT
DRAIN PENROSE 1/2X36 STERILE (WOUND CARE) ×4 IMPLANT
DRAIN SUMP SARATOGA 24F (WOUND CARE) ×4 IMPLANT
DRAPE BILATERAL SPLIT (DRAPES) ×4 IMPLANT
DRAPE CAMERA VIDEO/LASER (DRAPES) ×4 IMPLANT
DRAPE CV SPLIT W-CLR ANES SCRN (DRAPES) ×4 IMPLANT
DRAPE INCISE IOBAN 66X45 STRL (DRAPES) ×8 IMPLANT
DRAPE SLUSH/WARMER DISC (DRAPES) ×4 IMPLANT
DRSG AQUACEL AG ADV 3.5X14 (GAUZE/BANDAGES/DRESSINGS) ×4 IMPLANT
ELECT BLADE 4.0 EZ CLEAN MEGAD (MISCELLANEOUS) ×4
ELECT BLADE 6.5 EXT (BLADE) ×4 IMPLANT
ELECT NEEDLE TIP 2.8 STRL (NEEDLE) ×4 IMPLANT
ELECT REM PT RETURN 9FT ADLT (ELECTROSURGICAL) ×8
ELECTRODE BLDE 4.0 EZ CLN MEGD (MISCELLANEOUS) ×3 IMPLANT
ELECTRODE REM PT RTRN 9FT ADLT (ELECTROSURGICAL) ×6 IMPLANT
GAUZE SPONGE 4X4 12PLY STRL (GAUZE/BANDAGES/DRESSINGS) IMPLANT
GEL ULTRASOUND 20GR AQUASONIC (MISCELLANEOUS) ×4 IMPLANT
GLOVE BIO SURGEON STRL SZ 6 (GLOVE) ×12 IMPLANT
GLOVE BIO SURGEON STRL SZ 6.5 (GLOVE) ×12 IMPLANT
GLOVE BIO SURGEON STRL SZ7.5 (GLOVE) ×8 IMPLANT
GLOVE BIOGEL PI IND STRL 6 (GLOVE) ×6 IMPLANT
GLOVE BIOGEL PI IND STRL 6.5 (GLOVE) ×6 IMPLANT
GLOVE BIOGEL PI INDICATOR 6 (GLOVE) ×2
GLOVE BIOGEL PI INDICATOR 6.5 (GLOVE) ×2
GOWN STRL REUS W/ TWL LRG LVL3 (GOWN DISPOSABLE) ×18 IMPLANT
GOWN STRL REUS W/TWL LRG LVL3 (GOWN DISPOSABLE) ×6
HANDLE STAPLE ENDO GIA SHORT (STAPLE) ×1
HEMOSTAT SURGICEL 2X14 (HEMOSTASIS) ×4 IMPLANT
INSERT FOGARTY 61MM (MISCELLANEOUS) IMPLANT
KIT BASIN OR (CUSTOM PROCEDURE TRAY) ×4 IMPLANT
KIT ROOM TURNOVER OR (KITS) ×4 IMPLANT
KIT SUCTION CATH 14FR (SUCTIONS) ×8 IMPLANT
LIGASURE IMPACT 36 18CM CVD LR (INSTRUMENTS) ×4 IMPLANT
LOOP VESSEL MINI RED (MISCELLANEOUS) ×4 IMPLANT
NS IRRIG 1000ML POUR BTL (IV SOLUTION) ×8 IMPLANT
PACK CHEST (CUSTOM PROCEDURE TRAY) ×4 IMPLANT
PAD ARMBOARD 7.5X6 YLW CONV (MISCELLANEOUS) ×8 IMPLANT
PAD SHARPS MAGNETIC DISPOSAL (MISCELLANEOUS) ×4 IMPLANT
PENCIL BUTTON HOLSTER BLD 10FT (ELECTRODE) IMPLANT
PLUG CATH AND CAP STER (CATHETERS) ×4 IMPLANT
PROBE NERVBE PRASS .33 (MISCELLANEOUS) ×4 IMPLANT
PROBE PENCIL 8 MHZ STRL DISP (MISCELLANEOUS) ×4 IMPLANT
RELOAD EGIA 45 MED/THCK PURPLE (STAPLE) ×4 IMPLANT
RELOAD EGIA TRIS TAN 45 CVD (STAPLE) ×8 IMPLANT
RELOAD ENDO GIA 30 3.5 (STAPLE) IMPLANT
RELOAD LINEAR CUT PROX 55 BLUE (ENDOMECHANICALS) ×16 IMPLANT
RETAINER VISCERA MED (MISCELLANEOUS) ×4 IMPLANT
RETRACTOR WND ALEXIS 25 LRG (MISCELLANEOUS) ×3 IMPLANT
RTRCTR WOUND ALEXIS 25CM LRG (MISCELLANEOUS) ×4
SCALPEL HARMONIC ACE (MISCELLANEOUS) ×4 IMPLANT
SPONGE GAUZE 4X4 12PLY STER LF (GAUZE/BANDAGES/DRESSINGS) ×8 IMPLANT
SPONGE LAP 18X18 X RAY DECT (DISPOSABLE) ×20 IMPLANT
STAPLER ENDO GIA 12MM SHORT (STAPLE) ×3 IMPLANT
STAPLER PROXIMATE 55 BLUE (STAPLE) ×4 IMPLANT
STAPLER VISISTAT 35W (STAPLE) ×4 IMPLANT
SUCTION POOLE TIP (SUCTIONS) ×4 IMPLANT
SUT ETHILON 3 0 FSL (SUTURE) ×8 IMPLANT
SUT PDS AB 4-0 SH 27 (SUTURE) IMPLANT
SUT PROLENE 0 CT 1 CR/8 (SUTURE) IMPLANT
SUT PROLENE 1 XLH (SUTURE) ×4 IMPLANT
SUT PROLENE 2 0 MH 48 (SUTURE) IMPLANT
SUT PROLENE 2 TP 1 (SUTURE) ×8 IMPLANT
SUT PROLENE 3 0 RB 1 (SUTURE) IMPLANT
SUT PROLENE 4 0 RB 1 (SUTURE)
SUT PROLENE 4-0 RB1 .5 CRCL 36 (SUTURE) IMPLANT
SUT SILK  1 MH (SUTURE) ×2
SUT SILK 1 MH (SUTURE) ×6 IMPLANT
SUT SILK 1 TIES 10X30 (SUTURE) ×4 IMPLANT
SUT SILK 2 0 SH CR/8 (SUTURE) ×8 IMPLANT
SUT SILK 2 0SH CR/8 30 (SUTURE) ×4 IMPLANT
SUT SILK 3 0 SH CR/8 (SUTURE) IMPLANT
SUT SILK 3 0SH CR/8 30 (SUTURE) IMPLANT
SUT SILK 4 0 SH CR/8 (SUTURE) ×12 IMPLANT
SUT VIC AB 1 CTX 27 (SUTURE) IMPLANT
SUT VIC AB 2-0 CT1 18 (SUTURE) IMPLANT
SUT VIC AB 2-0 CTX 27 (SUTURE) ×4 IMPLANT
SUT VIC AB 2-0 CTX 36 (SUTURE) ×4 IMPLANT
SUT VIC AB 3-0 MH 27 (SUTURE) IMPLANT
SUT VIC AB 3-0 SH 18 (SUTURE) IMPLANT
SUT VIC AB 3-0 X1 27 (SUTURE) ×12 IMPLANT
SUT VIC AB 4-0 SH 18 (SUTURE) ×12 IMPLANT
SUT VICRYL 2 TP 1 (SUTURE) IMPLANT
SYR 20CC LL (SYRINGE) IMPLANT
SYR 30ML SLIP (SYRINGE) ×4 IMPLANT
SYR 5ML LUER SLIP (SYRINGE) ×4 IMPLANT
SYR TOOMEY 50ML (SYRINGE) ×4 IMPLANT
SYRINGE 10CC LL (SYRINGE) ×4 IMPLANT
SYSTEM SAHARA CHEST DRAIN RE-I (WOUND CARE) ×4 IMPLANT
TAPE CLOTH SURG 4X10 WHT LF (GAUZE/BANDAGES/DRESSINGS) ×4 IMPLANT
TAPE UMBILICAL 1/8 X36 TWILL (MISCELLANEOUS) IMPLANT
TAPE UMBILICAL COTTON 1/8X30 (MISCELLANEOUS) ×4 IMPLANT
TOWEL OR 17X24 6PK STRL BLUE (TOWEL DISPOSABLE) ×8 IMPLANT
TOWEL OR 17X26 10 PK STRL BLUE (TOWEL DISPOSABLE) ×8 IMPLANT
TRAY FOLEY CATH 16FRSI W/METER (SET/KITS/TRAYS/PACK) ×4 IMPLANT
TUBE CONNECTING 12X1/4 (SUCTIONS) IMPLANT
TUBE ENDOTRAC EMG 7X10.2 (MISCELLANEOUS) IMPLANT
TUBE ENDOTRAC EMG 8X11.3 (MISCELLANEOUS) ×4 IMPLANT
TUBE ENDOTRACH  EMG 6MMTUBE EN (MISCELLANEOUS)
TUBE ENDOTRACH EMG 6MMTUBE EN (MISCELLANEOUS) IMPLANT
WATER STERILE IRR 1000ML POUR (IV SOLUTION) ×4 IMPLANT

## 2015-07-25 NOTE — Anesthesia Procedure Notes (Signed)
Procedure Name: Intubation Date/Time: 07/25/2015 7:44 AM Performed by: Rebekah Chesterfield L Pre-anesthesia Checklist: Patient identified, Emergency Drugs available, Suction available, Patient being monitored and Timeout performed Patient Re-evaluated:Patient Re-evaluated prior to inductionOxygen Delivery Method: Circle system utilized Preoxygenation: Pre-oxygenation with 100% oxygen Intubation Type: IV induction Ventilation: Oral airway inserted - appropriate to patient size Laryngoscope Size: Mac and 4 Grade View: Grade I Tube type: Oral (NIMs) Tube size: 8.0 mm Number of attempts: 1 Airway Equipment and Method: Stylet Placement Confirmation: ETT inserted through vocal cords under direct vision,  positive ETCO2 and breath sounds checked- equal and bilateral Secured at: 24 cm Tube secured with: Tape Dental Injury: Teeth and Oropharynx as per pre-operative assessment

## 2015-07-25 NOTE — Progress Notes (Signed)
    [address]       [place] [address]             [phone]                    Mitsugi C Nierman [facility] Medical Record #[id number] Date of Birth: 05/21/1946  Referring: No ref. provider found Primary Care: ELLISON, SEAN, MD  Chief Complaint:    No chief complaint on file.   History of Present Illness:    Ronald Johnson 69 y.o. 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 The patient has a history of [name]'s esophagus dating back to biopsies in 2007. In the spring of this year he had begun having epigastric discomfort when eating and some mild trouble swallowing. A cardiac stress test was performed which was negative for ischemia endoscopy and ultimately endoscopic ultrasound were performed in late July, confirming adenocarcinoma the esophagus. The patient notes no weight loss in fact has gained approximately 15 pounds over the past 6 months. He had no blood in his stool until after the endoscopy.  The patient has known underlying pulmonary disease, he stopped smoking 6 years ago. 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  He completed  radiation  completed October 10  He completed week 5 Taxol/carboplatin on September 29   Previous screening PFTs revealed an FEV1 of 35% of predicted. Repeat pft's FEV1 2.27 54%  DLCO 20.54 50%   Wt Readings from Last 3 Encounters:  07/25/15 250 lb (113.399 kg)  07/24/15 250 lb 14.4 oz (113.807 kg)  07/21/15 256 lb 11.2 oz (116.438 kg)   Current Activity/ Functional Status:  Patient is independent with mobility/ambulation, transfers, ADL's, IADL's.   Zubrod Score: At the time of surgery this patient's most appropriate activity status/level should be described as: [x]    0    Normal activity, no symptoms []    1    Restricted in physical strenuous activity but  ambulatory, able to do out light work []    2    Ambulatory and capable of self care, unable to do work activities, up and about               >50 % of waking hours                              []    3    Only limited self care, in bed greater than 50% of waking hours []    4    Completely disabled, no self care, confined to bed or chair []    5    Moribund   Past Medical History  Diagnosis Date  . COLONIC POLYPS, ADENOMATOUS 08/01/2008    09/01/2012 also  . DIABETES MELLITUS, TYPE I 03/13/2007  . HYPERCHOLESTEROLEMIA 02/03/2008  . ANEMIA 08/15/2009  . Anxiety state, unspecified 12/15/2008  . DEPRESSION 03/13/2007  . HYPERTENSION 03/13/2007  . PULMONARY NODULE 11/24/2008  . GERD 03/13/2007  . TOBACCO ABUSE 11/24/2008  . EMPHYSEMA 11/08/2008  . Prostatism   . ED (erectile dysfunction)   . [name]'s esophagus   . Hiatal hernia   . Diverticulitis   . Arthritis   . COPD (chronic obstructive pulmonary disease) (HCC)   . Shortness of   breath dyspnea     WITH EXERTION     Past Surgical History  Procedure Laterality Date  . Egd  11/19/2005  . Foot fracture surgery Right 1980    right foot w/ pins and screws  . Removal pins and screws foot  1980    right foot  . Upper gastrointestinal endoscopy    . Circumcision    . Colonoscopy    . Polypectomy    . Vasectomy    . Eus N/A 03/16/2015    Procedure: UPPER ENDOSCOPIC ULTRASOUND (EUS) RADIAL;  Surgeon: Milus Banister, MD;  Location: [facility];  Service: Endoscopy;  Laterality: N/A;    Family History  Problem Relation Age of Onset  . Stroke Mother   . Diabetes Mother   . Colon cancer Neg Hx   . Diabetes Maternal Grandmother     mother side of the family aunts, MGF  . Breast cancer Paternal Grandmother     Social History   Social History  . Marital Status: Married    Spouse Name: N/A  . Number of Children: 2  . Years of Education: N/A   Occupational History  . INSPECTOR     works Youth worker   Social History Main  Topics  . Smoking status: Former Smoker -- 1.00 packs/day for 35 years    Types: Cigarettes    Quit date: 10/31/2008  . Smokeless tobacco: Never Used  . Alcohol Use: 0.0 oz/week    0 Standard drinks or equivalent per week     Comment: 2/month only on vacation  . Drug Use: No  . Sexual Activity: No   Other Topics Concern  . Not on file   Social History Narrative   Married, wife [place] with #2 grown children   Prior [employer], where he met his wife in [place]   Retired-prior work Personnel officer ADL's    History  Smoking status  . Former Smoker -- 1.00 packs/day for 35 years  . Types: Cigarettes  . Quit date: 10/31/2008  Smokeless tobacco  . Never Used    History  Alcohol Use  . 0.0 oz/week  . 0 Standard drinks or equivalent per week    Comment: 2/month only on vacation     No Known Allergies  Current Facility-Administered Medications  Medication Dose Route Frequency Provider Last Rate Last Dose  . cefOXitin (MEFOXIN) 2 g in dextrose 5 % 50 mL IVPB  2 g Intravenous To SS-Surg [name], MD      . insulin regular (NOVOLIN R,HUMULIN R) 250 Units in sodium chloride 0.9 % 250 mL (1 Units/mL) infusion   Intravenous Once [name], MD          Review of Systems:     Cardiac Review of Systems: Y or N  Chest Pain [   y ]  Resting SOB [ n  ] Exertional SOB  Blue.Reese  ]  Orthopnea [  ]   Pedal Edema [   ]    Palpitations [  ] Syncope  [  ]   Presyncope [   ]  General Review of Systems: [Y] = yes [  ]=no Constitional: recent weight change [ y ];  Wt loss over the last 3 months [   ] anorexia [  ]; fatigue [ y ]; nausea [  ]; night sweats [  ]; fever [  ]; or chills [  ];  Dental: poor dentition[  ]; Last Dentist visit:   Eye : blurred vision [  ]; diplopia [   ]; vision changes [  ];  Amaurosis fugax[  ]; Resp: cough [n  ];  wheezing[  ];  hemoptysis[  ]; shortness of breath[  ]; paroxysmal nocturnal dyspnea[  ]; dyspnea on exertion[  ]; or  orthopnea[  ];  GI:  gallstones[  ], vomiting[  ];  dysphagia[  ]; melena[  ];  hematochezia [  ]; heartburn[  ];   Hx of  Colonoscopy[  ]; GU: kidney stones [  ]; hematuria[  ];   dysuria [  ];  nocturia[  ];  history of     obstruction [  ]; urinary frequency [  ]             Skin: rash, swelling[  ];, hair loss[  ];  peripheral edema[  ];  or itching[  ]; Musculosketetal: myalgias[  ];  joint swelling[  ];  joint erythema[  ];  joint pain[  ];  back pain[  ];  Heme/Lymph: bruising[  ];  bleeding[  ];  anemia[  ];  Neuro: TIA[  ];  headaches[  ];  stroke[  ];  vertigo[  ];  seizures[  ];   paresthesias[  ];  difficulty walking[  ];  Psych:depression[  ]; anxiety[  ];  Endocrine: diabetes[y  ];  thyroid dysfunction[  ];  Immunizations: Flu up to date [  ]; Pneumococcal up to date [  ];  Other:  Physical Exam: BP 108/64 mmHg  Pulse 91  Temp(Src) 98 F (36.7 C) (Oral)  Resp 18  Ht _0  (1.93 m)  Wt 250 lb (113.399 kg)  BMI 30.44 kg/m2  SpO2 95%  PHYSICAL EXAMINATION: General appearance: alert, cooperative, appears stated age and no distress Head: Normocephalic, without obvious abnormality, atraumatic Neck: no adenopathy, no carotid bruit, no JVD, supple, symmetrical, trachea midline and thyroid not enlarged, symmetric, no tenderness/mass/nodules Lymph nodes: Cervical, supraclavicular, and axillary nodes normal. Resp: clear to auscultation bilaterally Back: symmetric, no curvature. ROM normal. No CVA tenderness. Cardio: regular rate and rhythm, S1, S2 normal, no murmur, click, rub or gallop GI: soft, non-tender; bowel sounds normal; no masses,  no organomegaly Extremities: extremities normal, atraumatic, no cyanosis or edema and Homans sign is negative, no sign of DVT Neurologic: Grossly normal Patient has full DP and PT pulses bilaterally    Diagnostic Studies & Laboratory data:     Recent Radiology Findings:  Ct Chest W Contrast/Ct Abdomen Pelvis W Contrast  07/20/2015   CLINICAL DATA:  Adenocarcinoma of the esophagogastric junction diagnosed on 03/08/2015, status post radiation therapy and chemotherapy completed 8 weeks prior, presenting for restaging. EXAM: CT CHEST, ABDOMEN, AND PELVIS WITH CONTRAST TECHNIQUE: Multidetector CT imaging of the chest, abdomen and pelvis was performed following the standard protocol during bolus administration of intravenous contrast. CONTRAST:  112m ISOVUE-300 IOPAMIDOL (ISOVUE-300) INJECTION 61% COMPARISON:  04/12/2015 PET-CT. 03/27/2015 chest CT. 02/17/2015 CT abdomen. FINDINGS: CT CHEST FINDINGS Mediastinum/Nodes: Normal heart size. No pericardial fluid/thickening. Left anterior descending, left circumflex and right coronary atherosclerosis. Great vessels are normal in course and caliber. There is an acute segmental pulmonary embolus within the anterior left lower lobe (series 3/image 41 and series 601/ image 82). No additional central pulmonary emboli are detected. Normal visualized thyroid. There is nonspecific circumferential mild wall thickening in the lower thoracic esophagus extending into the proximal gastric cardia, not definitely changed since 03/27/2015. No  axillary adenopathy. Stable borderline enlarged 1.0 cm left subcarinal node (series 3/ image 35), stable since 03/27/2015. No new mediastinal adenopathy. No hilar adenopathy. Lungs/Pleura: No pneumothorax. No pleural effusion. There are 6 scattered subcentimeter pulmonary nodules (1 in the posterior right upper lobe, 2 in the right lower lobe and 3 in the left lower lobe), largest 6 mm in the right lower lobe (series 4/ image 39), all unchanged since 05/14/2012, in keeping with a benign etiology. No acute consolidative airspace disease, new significant pulmonary nodules or lung masses. Musculoskeletal: No aggressive appearing focal osseous lesions. Mild-to-moderate degenerative changes in the thoracic spine. Stable mild symmetric gynecomastia. CT ABDOMEN PELVIS FINDINGS  Hepatobiliary: Normal liver with no liver mass. Normal gallbladder with no radiopaque cholelithiasis. No biliary ductal dilatation. Pancreas: Normal, with no mass or duct dilation. Spleen: Normal size. No mass. Adrenals/Urinary Tract: Normal right adrenal. Mild irregular thickening of the left adrenal gland up to 1.0 cm in thickness, unchanged since 05/14/12, and benign. Simple 1.6 cm upper right renal cyst. Simple 3.5 cm lateral interpolar right renal cyst. Simple 1.8 cm medial right lower renal cyst. Simple 2.9 cm anterior interpolar left renal cyst. A few additional subcentimeter hypodense renal lesions scattered throughout both kidneys are too small to characterize and not appreciably changed. No hydronephrosis. Normal bladder. Stomach/Bowel: No new sites of wall thickening in the nondistended stomach. Normal caliber small bowel with no small bowel wall thickening. Stable small periampullary duodenal diverticulum. Normal appendix. Normal large bowel with no diverticulosis, large bowel wall thickening or pericolonic fat stranding. Vascular/Lymphatic: Atherosclerotic nonaneurysmal abdominal aorta. Patent portal, splenic, hepatic and renal veins. No pathologically enlarged lymph nodes in the abdomen or pelvis. Reproductive: Stable top-normal size prostate with nonspecific internal prostatic calcification. Other: No pneumoperitoneum, ascites or focal fluid collection. Musculoskeletal: No aggressive appearing focal osseous lesions. Mild degenerative changes in the lumbar spine. IMPRESSION: 1. Incidentally detected acute segmental pulmonary embolus within the left lower lobe. No secondary evidence of pulmonary arterial hypertension or right heart strain. 2. Stable nonspecific mild circumferential wall thickening in the lower thoracic esophagus extending into the proximal gastric cardia, which could represent post treatment change or residual neoplasm. 3. No evidence of metastatic disease in the chest, abdomen or  pelvis. 4. Three-vessel coronary atherosclerosis. Critical Value/emergent results were called by telephone at the time of interpretation on 07/20/2015 at 1:29 pm to Dr. Lanelle Bal , who verbally acknowledged these results. Electronically Signed   By: Ilona Sorrel M.D.   On: 07/20/2015 13:41    I reviewed the CT scan with additional the radiologist  and question soft finding of possible incidentally noted left lower lobe subsegmental clot, although this study was not specifically timed with contrast to rule in or out PE. We did proceed after his visit in the office today to get a lower extremity duplex scan which ruled out any evidence of DVT. This point we'll continue with the above outlined plan for surgery and anticoagulation post op when safe    I have independently reviewed the above radiologic studies.  Recent Lab Findings: Lab Results  Component Value Date   WBC 7.4 07/24/2015   HGB 12.1* 07/24/2015   HCT 35.8* 07/24/2015   PLT 161 07/24/2015   GLUCOSE 145* 07/24/2015   CHOL 170 12/02/2014   TRIG * 12/02/2014    449.0 Triglyceride is over 400; calculations on Lipids are invalid.   HDL 32.40* 12/02/2014   LDLDIRECT 93.0 12/02/2014   LDLCALC 94 06/17/2014   ALT 26 07/24/2015  AST 32 07/24/2015   NA 136 07/24/2015   K 4.1 07/24/2015   CL 102 07/24/2015   CREATININE 1.24 07/24/2015   BUN 15 07/24/2015   CO2 26 07/24/2015   TSH 2.50 12/02/2014   INR 1.08 07/24/2015   HGBA1C 9.2 06/05/2015   EUS: Endoscopic findings: 1. The GE junction was nodular, ulcerated and friable. There was a mild stricture that allowed passage of the larger diameter radial echoendoscope with only mild resistance. 2. The upper GI tract was otherwise normal EUS findings: 1. There was obvious hypoechoic, non-circumferential, eccentric mass at the GE junction. This was approximately 4 cm long and the bulk of the mass lay on the gastric side of the GE junction. The mass was 1.2 cm thick maximally. It  was 3.7 cm across. The mass clearly invades into and through muscularis propria (uT3). 2. There was a small but suspicious paraesophageal lymph node located 2 cm proximal to the mass this was 6.4 mm in diameter, round, hypoechoic, discrete (uN1). ENDOSCOPIC IMPRESSION: 4cm long, 3.7cm across eccentric (non-circumferential) uT3N1 (Stage IIIA) GE junction adenocarinoma. The bulk of the tumor lays on the gastric side of the GE junction.  PATH: Patient: JAQUE, DACY Collected: 03/08/2015 Client: [facility] Accession: [id number] Diagnosis Surgical [P], distal esophagus - ADENOCARCINOMA, SEE COMMENT. Microscopic Comment The adenocarcinoma involves at least lamina propria and is arising in the background of intestinal metaplasia associated high grade glandular dysplasia. The case is reviewed with Dr Lyndon Code who concurs. The case was discussed with Dr. Henrene Pastor on 03/13/15. (CRR:gt, 03/13/15) [place] RUND DO   2007: 1. ESOPHAGUS, BIOPSY: ESOPHAGEAL MUCOSA WITH INTESTINAL METAPLASIA CONSISTENT WITH [name]' S MUCOSA AND FOCAL GLANDULAR ATYPIA CONSISTENT WITH LOW GRADE GLANDULAR DYSPLASIA. NO HIGH GRADE DYSPLASIA OR MALIGNANCY IDENTIFIED.  2013: 5. Surgical [P], esophagus, bx - SQUAMOCOLUMNAR MUCOSA WITH SQUAMOUS EPITHELIAL CHANGES CONSISTENT WITH REFLUX RELATED INJURY. - NEGATIVE FOR INTESTINAL METAPLASIA WITH GOBLET CELLS ([name]'S ESOPHAGUS). - NEGATIVE FOR MORPHOLOGICAL FEATURES OF EOSINOPHILIC ESOPHAGITIS. - REACTIVE AND INFLAMED GLANDULAR/COLUMNAR MUCOSA.  Assessment / Plan:   1) 4cm long, 3.7cm across eccentric (non-circumferential) uT3N1 (Stage IIIA) GE junction adenocarinoma, [place] type III associated with [name]'s .  Patient now has completed 5 cycles of chemotherapy and  radiation therapy . I have recommended proceeding with  transhiatal total esophagectomy and cervical esophagogastrostomy and feeding jejunostomy tube. 2) known COPD, full PFTs done 3) isotope  stress test was done recently without evidence of ischemia,     I had a detailed discussion with [name] regarding the magnitude of the surgical esophagectomy procedure as well as the risks, the expected benefits, and alternatives.  I quoted [name] 5% perioperative mortality rate and a complication rate as high as 40%.  We specifically discussed complications, which include, but were not limited to: recurrent nerve injury with possible permanent hoarseness, anastomotic leak, airway and great vessel injury, conduit ischemia, thoracic duct leak, the inability to complete the operation via a transhiatal approach requiring a right thoracotomy,  bleeding, need for blood transfusion and the potential need for ventilator support.  [name] has had questions answered is well informed and willing to proceed.    [name] MD      [address] [place] [address] Office [phone]   Beeper [phone]  07/25/2015 7:10 AM

## 2015-07-25 NOTE — H&P (Signed)
    [address]       [place] [address]             [phone]                    Eldred C Ollinger [place] [facility] Medical Record #[id number] Date of Birth: 12/30/1945  Referring: No ref. provider found Primary Care: ELLISON, SEAN, MD  Chief Complaint:    No chief complaint on file.   History of Present Illness:    Ronald Johnson 69 y.o. 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 The patient has a history of [name]'s esophagus dating back to biopsies in 2007. In the spring of this year he had begun having epigastric discomfort when eating and some mild trouble swallowing. A cardiac stress test was performed which was negative for ischemia endoscopy and ultimately endoscopic ultrasound were performed in late July, confirming adenocarcinoma the esophagus. The patient notes no weight loss in fact has gained approximately 15 pounds over the past 6 months. He had no blood in his stool until after the endoscopy.  The patient has known underlying pulmonary disease, he stopped smoking 6 years ago. 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  He completed  radiation  completed October 10  He completed week 5 Taxol/carboplatin on September 29   Previous screening PFTs revealed an FEV1 of 35% of predicted. Repeat pft's FEV1 2.27 54%  DLCO 20.54 50%   Wt Readings from Last 3 Encounters:  07/25/15 250 lb (113.399 kg)  07/24/15 250 lb 14.4 oz (113.807 kg)  07/21/15 256 lb 11.2 oz (116.438 kg)   Current Activity/ Functional Status:  Patient is independent with mobility/ambulation, transfers, ADL's, IADL's.   Zubrod Score: At the time of surgery this patient's most appropriate activity status/level should be described as: [x]    0    Normal activity, no symptoms []    1    Restricted in physical strenuous activity but  ambulatory, able to do out light work []    2    Ambulatory and capable of self care, unable to do work activities, up and about               >50 % of waking hours                              []    3    Only limited self care, in bed greater than 50% of waking hours []    4    Completely disabled, no self care, confined to bed or chair []    5    Moribund   Past Medical History  Diagnosis Date  . COLONIC POLYPS, ADENOMATOUS 08/01/2008    09/01/2012 also  . DIABETES MELLITUS, TYPE I 03/13/2007  . HYPERCHOLESTEROLEMIA 02/03/2008  . ANEMIA 08/15/2009  . Anxiety state, unspecified 12/15/2008  . DEPRESSION 03/13/2007  . HYPERTENSION 03/13/2007  . PULMONARY NODULE 11/24/2008  . GERD 03/13/2007  . TOBACCO ABUSE 11/24/2008  . EMPHYSEMA 11/08/2008  . Prostatism   . ED (erectile dysfunction)   . [name]'s esophagus   . Hiatal hernia   . Diverticulitis   . Arthritis   . COPD (chronic obstructive pulmonary disease) (HCC)   . Shortness of   breath dyspnea     WITH EXERTION     Past Surgical History  Procedure Laterality Date  . Egd  11/19/2005  . Foot fracture surgery Right 1980    right foot w/ pins and screws  . Removal pins and screws foot  1980    right foot  . Upper gastrointestinal endoscopy    . Circumcision    . Colonoscopy    . Polypectomy    . Vasectomy    . Eus N/A 03/16/2015    Procedure: UPPER ENDOSCOPIC ULTRASOUND (EUS) RADIAL;  Surgeon: Daniel P Jacobs, MD;  Location: [facility];  Service: Endoscopy;  Laterality: N/A;    Family History  Problem Relation Age of Onset  . Stroke Mother   . Diabetes Mother   . Colon cancer Neg Hx   . Diabetes Maternal Grandmother     mother side of the family aunts, MGF  . Breast cancer Paternal Grandmother     Social History   Social History  . Marital Status: Married    Spouse Name: N/A  . Number of Children: 2  . Years of Education: N/A   Occupational History  . INSPECTOR     works inspecting pipelines   Social History Main  Topics  . Smoking status: Former Smoker -- 1.00 packs/day for 35 years    Types: Cigarettes    Quit date: 10/31/2008  . Smokeless tobacco: Never Used  . Alcohol Use: 0.0 oz/week    0 Standard drinks or equivalent per week     Comment: 2/month only on vacation  . Drug Use: No  . Sexual Activity: No   Other Topics Concern  . Not on file   Social History Narrative   Married, wife Maryann with #2 grown children   Prior [employer], where he met his wife in [place]   Retired-prior work pipeline inspection    Independent ADL's    History  Smoking status  . Former Smoker -- 1.00 packs/day for 35 years  . Types: Cigarettes  . Quit date: 10/31/2008  Smokeless tobacco  . Never Used    History  Alcohol Use  . 0.0 oz/week  . 0 Standard drinks or equivalent per week    Comment: 2/month only on vacation     No Known Allergies  Current Facility-Administered Medications  Medication Dose Route Frequency Provider Last Rate Last Dose  . albuterol (PROVENTIL) (2.5 MG/3ML) 0.083% nebulizer solution 2.5 mg  2.5 mg Nebulization Q6H PRN Wayne E Gold, PA-C      . cefOXitin (MEFOXIN) 2 g in dextrose 5 % 50 mL IVPB  2 g Intravenous Q6H Wayne E Gold, PA-C      . dexmedetomidine (PRECEDEX) 200 MCG/50ML (4 mcg/mL) infusion  0.4-1.2 mcg/kg/hr Intravenous Titrated Edward B Gerhardt, MD      . dextrose 5 %-0.9 % sodium chloride infusion   Intravenous Continuous Wayne E Gold, PA-C 125 mL/hr at 07/25/15 1700    . insulin aspart (novoLOG) injection 0-24 Units  0-24 Units Subcutaneous 6 times per day Wayne E Gold, PA-C      . insulin regular (NOVOLIN R,HUMULIN R) 250 Units in sodium chloride 0.9 % 250 mL (1 Units/mL) infusion   Intravenous Continuous Edward B Gerhardt, MD      . midazolam (VERSED) injection 2 mg  2 mg Intravenous Q1H PRN Edward B Gerhardt, MD   2 mg at 07/25/15 1633  . morphine 2 MG/ML injection 1-4 mg  1-4 mg Intravenous Q1H PRN Edward B Gerhardt,   MD      . ondansetron (ZOFRAN) injection 4 mg   4 mg Intravenous Q4H PRN Wayne E Gold, PA-C      . pantoprazole (PROTONIX) injection 40 mg  40 mg Intravenous Q12H Wayne E Gold, PA-C      . phenylephrine (NEO-SYNEPHRINE) 10 mg in dextrose 5 % 250 mL (0.04 mg/mL) infusion  0-400 mcg/min Intravenous Titrated Edward B Gerhardt, MD   Stopped at 07/25/15 1700  . potassium chloride 10 mEq in 50 mL *CENTRAL LINE* IVPB  10 mEq Intravenous PRN Wayne E Gold, PA-C      . sodium chloride 0.9 % injection 10 mL  10 mL Intravenous Q12H Wayne E Gold, PA-C          Review of Systems:     Cardiac Review of Systems: Y or N  Chest Pain [   y ]  Resting SOB [ n  ] Exertional SOB  [y  ]  Orthopnea [  ]   Pedal Edema [   ]    Palpitations [  ] Syncope  [  ]   Presyncope [   ]  General Review of Systems: [Y] = yes [  ]=no Constitional: recent weight change [ y ];  Wt loss over the last 3 months [   ] anorexia [  ]; fatigue [ y ]; nausea [  ]; night sweats [  ]; fever [  ]; or chills [  ];          Dental: poor dentition[  ]; Last Dentist visit:   Eye : blurred vision [  ]; diplopia [   ]; vision changes [  ];  Amaurosis fugax[  ]; Resp: cough [n  ];  wheezing[  ];  hemoptysis[  ]; shortness of breath[  ]; paroxysmal nocturnal dyspnea[  ]; dyspnea on exertion[  ]; or orthopnea[  ];  GI:  gallstones[  ], vomiting[  ];  dysphagia[  ]; melena[  ];  hematochezia [  ]; heartburn[  ];   Hx of  Colonoscopy[  ]; GU: kidney stones [  ]; hematuria[  ];   dysuria [  ];  nocturia[  ];  history of     obstruction [  ]; urinary frequency [  ]             Skin: rash, swelling[  ];, hair loss[  ];  peripheral edema[  ];  or itching[  ]; Musculosketetal: myalgias[  ];  joint swelling[  ];  joint erythema[  ];  joint pain[  ];  back pain[  ];  Heme/Lymph: bruising[  ];  bleeding[  ];  anemia[  ];  Neuro: TIA[  ];  headaches[  ];  stroke[  ];  vertigo[  ];  seizures[  ];   paresthesias[  ];  difficulty walking[  ];  Psych:depression[  ]; anxiety[  ];  Endocrine: diabetes[y  ];   thyroid dysfunction[  ];  Immunizations: Flu up to date [  ]; Pneumococcal up to date [  ];  Other:  Physical Exam: BP 130/64 mmHg  Pulse 80  Temp(Src) 99.2 F (37.3 C) (Oral)  Resp 22  Ht 6' 4" (1.93 m)  Wt 250 lb (113.399 kg)  BMI 30.44 kg/m2  SpO2 100%  PHYSICAL EXAMINATION: General appearance: alert, cooperative, appears stated age and no distress Head: Normocephalic, without obvious abnormality, atraumatic Neck: no adenopathy, no carotid bruit, no JVD, supple, symmetrical, trachea midline and thyroid not enlarged,   symmetric, no tenderness/mass/nodules Lymph nodes: Cervical, supraclavicular, and axillary nodes normal. Resp: clear to auscultation bilaterally Back: symmetric, no curvature. ROM normal. No CVA tenderness. Cardio: regular rate and rhythm, S1, S2 normal, no murmur, click, rub or gallop GI: soft, non-tender; bowel sounds normal; no masses,  no organomegaly Extremities: extremities normal, atraumatic, no cyanosis or edema and Homans sign is negative, no sign of DVT Neurologic: Grossly normal Patient has full DP and PT pulses bilaterally    Diagnostic Studies & Laboratory data:     Recent Radiology Findings:  Ct Chest W Contrast/Ct Abdomen Pelvis W Contrast  07/20/2015  CLINICAL DATA:  Adenocarcinoma of the esophagogastric junction diagnosed on 03/08/2015, status post radiation therapy and chemotherapy completed 8 weeks prior, presenting for restaging. EXAM: CT CHEST, ABDOMEN, AND PELVIS WITH CONTRAST TECHNIQUE: Multidetector CT imaging of the chest, abdomen and pelvis was performed following the standard protocol during bolus administration of intravenous contrast. CONTRAST:  143m ISOVUE-300 IOPAMIDOL (ISOVUE-300) INJECTION 61% COMPARISON:  04/12/2015 PET-CT. 03/27/2015 chest CT. 02/17/2015 CT abdomen. FINDINGS: CT CHEST FINDINGS Mediastinum/Nodes: Normal heart size. No pericardial fluid/thickening. Left anterior descending, left circumflex and right coronary  atherosclerosis. Great vessels are normal in course and caliber. There is an acute segmental pulmonary embolus within the anterior left lower lobe (series 3/image 41 and series 601/ image 82). No additional central pulmonary emboli are detected. Normal visualized thyroid. There is nonspecific circumferential mild wall thickening in the lower thoracic esophagus extending into the proximal gastric cardia, not definitely changed since 03/27/2015. No axillary adenopathy. Stable borderline enlarged 1.0 cm left subcarinal node (series 3/ image 35), stable since 03/27/2015. No new mediastinal adenopathy. No hilar adenopathy. Lungs/Pleura: No pneumothorax. No pleural effusion. There are 6 scattered subcentimeter pulmonary nodules (1 in the posterior right upper lobe, 2 in the right lower lobe and 3 in the left lower lobe), largest 6 mm in the right lower lobe (series 4/ image 39), all unchanged since 05/14/2012, in keeping with a benign etiology. No acute consolidative airspace disease, new significant pulmonary nodules or lung masses. Musculoskeletal: No aggressive appearing focal osseous lesions. Mild-to-moderate degenerative changes in the thoracic spine. Stable mild symmetric gynecomastia. CT ABDOMEN PELVIS FINDINGS Hepatobiliary: Normal liver with no liver mass. Normal gallbladder with no radiopaque cholelithiasis. No biliary ductal dilatation. Pancreas: Normal, with no mass or duct dilation. Spleen: Normal size. No mass. Adrenals/Urinary Tract: Normal right adrenal. Mild irregular thickening of the left adrenal gland up to 1.0 cm in thickness, unchanged since 05/14/12, and benign. Simple 1.6 cm upper right renal cyst. Simple 3.5 cm lateral interpolar right renal cyst. Simple 1.8 cm medial right lower renal cyst. Simple 2.9 cm anterior interpolar left renal cyst. A few additional subcentimeter hypodense renal lesions scattered throughout both kidneys are too small to characterize and not appreciably changed. No  hydronephrosis. Normal bladder. Stomach/Bowel: No new sites of wall thickening in the nondistended stomach. Normal caliber small bowel with no small bowel wall thickening. Stable small periampullary duodenal diverticulum. Normal appendix. Normal large bowel with no diverticulosis, large bowel wall thickening or pericolonic fat stranding. Vascular/Lymphatic: Atherosclerotic nonaneurysmal abdominal aorta. Patent portal, splenic, hepatic and renal veins. No pathologically enlarged lymph nodes in the abdomen or pelvis. Reproductive: Stable top-normal size prostate with nonspecific internal prostatic calcification. Other: No pneumoperitoneum, ascites or focal fluid collection. Musculoskeletal: No aggressive appearing focal osseous lesions. Mild degenerative changes in the lumbar spine. IMPRESSION: 1. Incidentally detected acute segmental pulmonary embolus within the left lower lobe. No secondary evidence of pulmonary arterial hypertension  or right heart strain. 2. Stable nonspecific mild circumferential wall thickening in the lower thoracic esophagus extending into the proximal gastric cardia, which could represent post treatment change or residual neoplasm. 3. No evidence of metastatic disease in the chest, abdomen or pelvis. 4. Three-vessel coronary atherosclerosis. Critical Value/emergent results were called by telephone at the time of interpretation on 07/20/2015 at 1:29 pm to Dr. Lanelle Bal , who verbally acknowledged these results. Electronically Signed   By: Ilona Sorrel M.D.   On: 07/20/2015 13:41    I reviewed the CT scan with additional the radiologist  and question soft finding of possible incidentally noted left lower lobe subsegmental clot, although this study was not specifically timed with contrast to rule in or out PE. We did proceed after his visit in the office today to get a lower extremity duplex scan which ruled out any evidence of DVT. This point we'll continue with the above outlined plan for  surgery and anticoagulation post op when safe    I have independently reviewed the above radiologic studies.  Recent Lab Findings: Lab Results  Component Value Date   WBC 6.9 07/25/2015   HGB 9.2* 07/25/2015   HCT 27.0* 07/25/2015   PLT PENDING 07/25/2015   GLUCOSE 132* 07/25/2015   CHOL 170 12/02/2014   TRIG * 12/02/2014    449.0 Triglyceride is over 400; calculations on Lipids are invalid.   HDL 32.40* 12/02/2014   LDLDIRECT 93.0 12/02/2014   LDLCALC 94 06/17/2014   ALT 26 07/24/2015   AST 32 07/24/2015   NA 137 07/25/2015   K 4.8 07/25/2015   CL 105 07/25/2015   CREATININE 1.57* 07/25/2015   BUN 17 07/25/2015   CO2 25 07/25/2015   TSH 2.50 12/02/2014   INR 1.08 07/24/2015   HGBA1C 9.2 06/05/2015   EUS: Endoscopic findings: 1. The GE junction was nodular, ulcerated and friable. There was a mild stricture that allowed passage of the larger diameter radial echoendoscope with only mild resistance. 2. The upper GI tract was otherwise normal EUS findings: 1. There was obvious hypoechoic, non-circumferential, eccentric mass at the GE junction. This was approximately 4 cm long and the bulk of the mass lay on the gastric side of the GE junction. The mass was 1.2 cm thick maximally. It was 3.7 cm across. The mass clearly invades into and through muscularis propria (uT3). 2. There was a small but suspicious paraesophageal lymph node located 2 cm proximal to the mass this was 6.4 mm in diameter, round, hypoechoic, discrete (uN1). ENDOSCOPIC IMPRESSION: 4cm long, 3.7cm across eccentric (non-circumferential) uT3N1 (Stage IIIA) GE junction adenocarinoma. The bulk of the tumor lays on the gastric side of the GE junction.  PATH: Patient: Ronald Johnson, Ronald Johnson Collected: 03/08/2015 Client: [facility] Accession: [id number] Diagnosis Surgical [P], distal esophagus - ADENOCARCINOMA, SEE COMMENT. Microscopic Comment The adenocarcinoma involves at least lamina propria and  is arising in the background of intestinal metaplasia associated high grade glandular dysplasia. The case is reviewed with Dr Lyndon Code who concurs. The case was discussed with Dr. Henrene Pastor on 03/13/15. (CRR:gt, 03/13/15) [place] RUND DO   2007: 1. ESOPHAGUS, BIOPSY: ESOPHAGEAL MUCOSA WITH INTESTINAL METAPLASIA CONSISTENT WITH [name]' S MUCOSA AND FOCAL GLANDULAR ATYPIA CONSISTENT WITH LOW GRADE GLANDULAR DYSPLASIA. NO HIGH GRADE DYSPLASIA OR MALIGNANCY IDENTIFIED.  2013: 5. Surgical [P], esophagus, bx - SQUAMOCOLUMNAR MUCOSA WITH SQUAMOUS EPITHELIAL CHANGES CONSISTENT WITH REFLUX RELATED INJURY. - NEGATIVE FOR INTESTINAL METAPLASIA WITH GOBLET CELLS ([name]'S ESOPHAGUS). - NEGATIVE FOR MORPHOLOGICAL FEATURES OF  EOSINOPHILIC ESOPHAGITIS. - REACTIVE AND INFLAMED GLANDULAR/COLUMNAR MUCOSA.  Assessment / Plan:   1) 4cm long, 3.7cm across eccentric (non-circumferential) uT3N1 (Stage IIIA) GE junction adenocarinoma, Seward type III associated with [name]'s .  Patient now has completed 5 cycles of chemotherapy and  radiation therapy . I have recommended proceeding with  transhiatal total esophagectomy and cervical esophagogastrostomy and feeding jejunostomy tube. 2) known COPD, full PFTs done 3) isotope stress test was done recently without evidence of ischemia,     I had a detailed discussion with Ronald Johnson regarding the magnitude of the surgical esophagectomy procedure as well as the risks, the expected benefits, and alternatives.  I quoted Ronald Johnson 5% perioperative mortality rate and a complication rate as high as 40%.  We specifically discussed complications, which include, but were not limited to: recurrent nerve injury with possible permanent hoarseness, anastomotic leak, airway and great vessel injury, conduit ischemia, thoracic duct leak, the inability to complete the operation via a transhiatal approach requiring a right thoracotomy,  bleeding, need for blood transfusion and the  potential need for ventilator support.  Ronald Johnson has had questions answered is well informed and willing to proceed.    [name] MD      [address] [place] [address] Office [phone]   Beeper [phone]  07/25/2015 5:21 PM

## 2015-07-25 NOTE — Procedures (Signed)
Extubation Procedure Note  Patient Details:   Name: Ronald Johnson DOB: Apr 21, 1946 MRN: [id number]   Airway Documentation:     Evaluation  O2 sats: stable throughout Complications: No apparent complications Patient did tolerate procedure well. Bilateral Breath Sounds: Clear   Yes   Procedure was explain to the pt prior to extubation. Pt passed all weaning parameters. NIF -32, VC 1.3L. Good Arterial Blood Gas. Pt was ask to take a breath in prior to pulling the ETT. Pt nodded his head for understanding. Pt was extubated to 4L [place] oxygen humidity provided. Pt was stable throughout extubation. Pt tolerate well. Pt was able to speak and state his name post extubation. Pt is stable at this time. No complications noted, RN at bedside   Leigh Aurora 07/25/2015, 9:23 PM

## 2015-07-25 NOTE — Transfer of Care (Signed)
Immediate Anesthesia Transfer of Care Note  Patient: [name]  Procedure(s) Performed: Procedure(s): TRANSHIATAL TOTAL ESOPHAGECTOMY COMPLETE PYLOROMYOTOMY (N/A) FEEDING JEJUNOSTOMY (N/A) VIDEO BRONCHOSCOPY (N/A)  Patient Location: SICU  Anesthesia Type:General  Level of Consciousness: Patient remains intubated per anesthesia plan  Airway & Oxygen Therapy: Patient remains intubated per anesthesia plan  Post-op Assessment: Report given to RN and Post -op Vital signs reviewed and stable  Post vital signs: Reviewed and stable  Last Vitals:  Filed Vitals:   07/25/15 0550  BP: 108/64  Pulse: 91  Temp: 36.7 C  Resp: 18    Complications: No apparent anesthesia complications

## 2015-07-25 NOTE — Brief Op Note (Addendum)
      [address]       [place] [address]             [phone]     07/25/2015  3:20 PM  PATIENT:  Ronald Johnson  69 y.o. male  PRE-OPERATIVE DIAGNOSIS:  ESOPHAGEAL CANCER  POST-OPERATIVE DIAGNOSIS:  ESOPHAGEAL CANCER  PROCEDURE:  Procedure(s): TRANSHIATAL TOTAL ESOPHAGECTOMY COMPLETE PYLOROMYOTOMY (N/A) FEEDING JEJUNOSTOMY (N/A) VIDEO BRONCHOSCOPY (N/A)  SURGEON:  Surgeon(s) and Role:    * [name], MD - Primary  PHYSICIAN ASSISTANT: WANE GOLD PA-C  ASSISTANTS: none   ANESTHESIA:   general  EBL:  Total I/O In: [id number] [I.V.:3000; IV Piggyback:750] Out: [id number] [Urine:610; Blood:575]  BLOOD ADMINISTERED:none  DRAINS: Penrose drain in the NECK, 1 Chest Tube(s) in the LEFT HEMITHORAX and Jejunostomy Tube   LOCAL MEDICATIONS USED:  NONE  SPECIMEN:  Source of Specimen:  ESOPHAGOGASTRECTOMY  DISPOSITION OF SPECIMEN:  PATHOLOGY  COUNTS:  YES . DICTATION: .Other Dictation: Dictation Number PENDING  PLAN OF CARE: Admit to inpatient   PATIENT DISPOSITION:  ICU - intubated and hemodynamically stable.   Delay start of Pharmacological VTE agent (>24hrs) due to surgical blood loss or risk of bleeding: yes

## 2015-07-25 NOTE — Progress Notes (Signed)
Pt is on PS/CPAP tolerating it well. RN aware. Pt is stable at this time no complications noted.

## 2015-07-25 NOTE — Anesthesia Preprocedure Evaluation (Signed)
Anesthesia Evaluation  Patient identified by MRN, date of birth, ID band Patient awake    Reviewed: Allergy & Precautions, NPO status , Patient's Chart, lab work & pertinent test results  Airway Mallampati: II   Neck ROM: full    Dental   Pulmonary shortness of breath, COPD, former smoker,    breath sounds clear to auscultation       Cardiovascular hypertension,  Rhythm:regular Rate:Normal     Neuro/Psych Anxiety Depression  Neuromuscular disease    GI/Hepatic hiatal hernia, GERD  ,Esophageal CA.   Endo/Other  diabetes  Renal/GU      Musculoskeletal  (+) Arthritis ,   Abdominal   Peds  Hematology   Anesthesia Other Findings   Reproductive/Obstetrics                             Anesthesia Physical Anesthesia Plan  ASA: III  Anesthesia Plan: General   Post-op Pain Management:    Induction: Intravenous  Airway Management Planned: Oral ETT  Additional Equipment: Arterial line, CVP and Ultrasound Guidance Line Placement  Intra-op Plan:   Post-operative Plan: Possible Post-op intubation/ventilation  Informed Consent: I have reviewed the patients History and Physical, chart, labs and discussed the procedure including the risks, benefits and alternatives for the proposed anesthesia with the patient or authorized representative who has indicated his/her understanding and acceptance.     Plan Discussed with: CRNA, Anesthesiologist and Surgeon  Anesthesia Plan Comments:         Anesthesia Quick Evaluation

## 2015-07-25 NOTE — Progress Notes (Signed)
    [address]       [place] [address]             [phone]                    [name] [name] #[id number] Date of Birth: 06/07/1946  Referring: No ref. provider found Primary Care: ELLISON, SEAN, MD  Chief Complaint:    No chief complaint on file.   History of Present Illness:    Ronald Johnson 69 y.o. 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 The patient has a history of [name]'s esophagus dating back to biopsies in 2007. In the spring of this year he had begun having epigastric discomfort when eating and some mild trouble swallowing. A cardiac stress test was performed which was negative for ischemia endoscopy and ultimately endoscopic ultrasound were performed in late July, confirming adenocarcinoma the esophagus. The patient notes no weight loss in fact has gained approximately 15 pounds over the past 6 months. He had no blood in his stool until after the endoscopy.  The patient has known underlying pulmonary disease, he stopped smoking 6 years ago. 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  He completed  radiation  completed October 10  He completed week 5 Taxol/carboplatin on September 29   Previous screening PFTs revealed an FEV1 of 35% of predicted. Repeat pft's FEV1 2.27 54%  DLCO 20.54 50%   Wt Readings from Last 3 Encounters:  07/25/15 250 lb (113.399 kg)  07/24/15 250 lb 14.4 oz (113.807 kg)  07/21/15 256 lb 11.2 oz (116.438 kg)   Current Activity/ Functional Status:  Patient is independent with mobility/ambulation, transfers, ADL's, IADL's.   Zubrod Score: At the time of surgery this patient's most appropriate activity status/level should be described as: [x]    0    Normal activity, no symptoms []    1    Restricted in physical strenuous activity but  ambulatory, able to do out light work []    2    Ambulatory and capable of self care, unable to do work activities, up and about               >50 % of waking hours                              []    3    Only limited self care, in bed greater than 50% of waking hours []    4    Completely disabled, no self care, confined to bed or chair []    5    Moribund   Past Medical History  Diagnosis Date  . COLONIC POLYPS, ADENOMATOUS 08/01/2008    09/01/2012 also  . DIABETES MELLITUS, TYPE I 03/13/2007  . HYPERCHOLESTEROLEMIA 02/03/2008  . ANEMIA 08/15/2009  . Anxiety state, unspecified 12/15/2008  . DEPRESSION 03/13/2007  . HYPERTENSION 03/13/2007  . PULMONARY NODULE 11/24/2008  . GERD 03/13/2007  . TOBACCO ABUSE 11/24/2008  . EMPHYSEMA 11/08/2008  . Prostatism   . ED (erectile dysfunction)   . [name]'s esophagus   . Hiatal hernia   . Diverticulitis   . Arthritis   . COPD (chronic obstructive pulmonary disease) (HCC)   . Shortness of   breath dyspnea     WITH EXERTION     Past Surgical History  Procedure Laterality Date  . Egd  11/19/2005  . Foot fracture surgery Right 1980    right foot w/ pins and screws  . Removal pins and screws foot  1980    right foot  . Upper gastrointestinal endoscopy    . Circumcision    . Colonoscopy    . Polypectomy    . Vasectomy    . Eus N/A 03/16/2015    Procedure: UPPER ENDOSCOPIC ULTRASOUND (EUS) RADIAL;  Surgeon: Milus Banister, MD;  Location: [facility];  Service: Endoscopy;  Laterality: N/A;    Family History  Problem Relation Age of Onset  . Stroke Mother   . Diabetes Mother   . Colon cancer Neg Hx   . Diabetes Maternal Grandmother     mother side of the family aunts, MGF  . Breast cancer Paternal Grandmother     Social History   Social History  . Marital Status: Married    Spouse Name: N/A  . Number of Children: 2  . Years of Education: N/A   Occupational History  . INSPECTOR     works Youth worker   Social History Main  Topics  . Smoking status: Former Smoker -- 1.00 packs/day for 35 years    Types: Cigarettes    Quit date: 10/31/2008  . Smokeless tobacco: Never Used  . Alcohol Use: 0.0 oz/week    0 Standard drinks or equivalent per week     Comment: 2/month only on vacation  . Drug Use: No  . Sexual Activity: No   Other Topics Concern  . Not on file   Social History Narrative   Married, wife [place] with #2 grown children   Prior [employer], where he met his wife in [place]   Retired-prior work Personnel officer ADL's    History  Smoking status  . Former Smoker -- 1.00 packs/day for 35 years  . Types: Cigarettes  . Quit date: 10/31/2008  Smokeless tobacco  . Never Used    History  Alcohol Use  . 0.0 oz/week  . 0 Standard drinks or equivalent per week    Comment: 2/month only on vacation     No Known Allergies  Current Facility-Administered Medications  Medication Dose Route Frequency Provider Last Rate Last Dose  . albuterol (PROVENTIL) (2.5 MG/3ML) 0.083% nebulizer solution 2.5 mg  2.5 mg Nebulization Q6H PRN Wayne E Gold, PA-C      . cefOXitin (MEFOXIN) 2 g in dextrose 5 % 50 mL IVPB  2 g Intravenous Q6H Wayne E Gold, PA-C      . dexmedetomidine (PRECEDEX) 200 MCG/50ML (4 mcg/mL) infusion  0.4-1.2 mcg/kg/hr Intravenous Titrated [name], MD      . dextrose 5 %-0.9 % sodium chloride infusion   Intravenous Continuous John Giovanni, PA-C 125 mL/hr at 07/25/15 1700    . insulin aspart (novoLOG) injection 0-24 Units  0-24 Units Subcutaneous 6 times per day Wayne E Gold, PA-C      . insulin regular (NOVOLIN R,HUMULIN R) 250 Units in sodium chloride 0.9 % 250 mL (1 Units/mL) infusion   Intravenous Continuous [name], MD      . midazolam (VERSED) injection 2 mg  2 mg Intravenous Q1H PRN [name], MD   2 mg at 07/25/15 1633  . morphine 2 MG/ML injection 1-4 mg  1-4 mg Intravenous Q1H PRN [name],  MD      . ondansetron (ZOFRAN) injection 4 mg   4 mg Intravenous Q4H PRN Wayne E Gold, PA-C      . pantoprazole (PROTONIX) injection 40 mg  40 mg Intravenous Q12H Wayne E Gold, PA-C      . phenylephrine (NEO-SYNEPHRINE) 10 mg in dextrose 5 % 250 mL (0.04 mg/mL) infusion  0-400 mcg/min Intravenous Titrated Edward B Gerhardt, MD   Stopped at 07/25/15 1700  . potassium chloride 10 mEq in 50 mL *CENTRAL LINE* IVPB  10 mEq Intravenous PRN Wayne E Gold, PA-C      . sodium chloride 0.9 % injection 10 mL  10 mL Intravenous Q12H Wayne E Gold, PA-C          Review of Systems:     Cardiac Review of Systems: Y or N  Chest Pain [   y ]  Resting SOB [ n  ] Exertional SOB  [y  ]  Orthopnea [  ]   Pedal Edema [   ]    Palpitations [  ] Syncope  [  ]   Presyncope [   ]  General Review of Systems: [Y] = yes [  ]=no Constitional: recent weight change [ y ];  Wt loss over the last 3 months [   ] anorexia [  ]; fatigue [ y ]; nausea [  ]; night sweats [  ]; fever [  ]; or chills [  ];          Dental: poor dentition[  ]; Last Dentist visit:   Eye : blurred vision [  ]; diplopia [   ]; vision changes [  ];  Amaurosis fugax[  ]; Resp: cough [n  ];  wheezing[  ];  hemoptysis[  ]; shortness of breath[  ]; paroxysmal nocturnal dyspnea[  ]; dyspnea on exertion[  ]; or orthopnea[  ];  GI:  gallstones[  ], vomiting[  ];  dysphagia[  ]; melena[  ];  hematochezia [  ]; heartburn[  ];   Hx of  Colonoscopy[  ]; GU: kidney stones [  ]; hematuria[  ];   dysuria [  ];  nocturia[  ];  history of     obstruction [  ]; urinary frequency [  ]             Skin: rash, swelling[  ];, hair loss[  ];  peripheral edema[  ];  or itching[  ]; Musculosketetal: myalgias[  ];  joint swelling[  ];  joint erythema[  ];  joint pain[  ];  back pain[  ];  Heme/Lymph: bruising[  ];  bleeding[  ];  anemia[  ];  Neuro: TIA[  ];  headaches[  ];  stroke[  ];  vertigo[  ];  seizures[  ];   paresthesias[  ];  difficulty walking[  ];  Psych:depression[  ]; anxiety[  ];  Endocrine: diabetes[y  ];   thyroid dysfunction[  ];  Immunizations: Flu up to date [  ]; Pneumococcal up to date [  ];  Other:  Physical Exam: BP 130/64 mmHg  Pulse 80  Temp(Src) 99.2 F (37.3 C) (Oral)  Resp 22  Ht 6' 4" (1.93 m)  Wt 250 lb (113.399 kg)  BMI 30.44 kg/m2  SpO2 100%  PHYSICAL EXAMINATION: General appearance: alert, cooperative, appears stated age and no distress Head: Normocephalic, without obvious abnormality, atraumatic Neck: no adenopathy, no carotid bruit, no JVD, supple, symmetrical, trachea midline and thyroid not enlarged,   symmetric, no tenderness/mass/nodules Lymph nodes: Cervical, supraclavicular, and axillary nodes normal. Resp: clear to auscultation bilaterally Back: symmetric, no curvature. ROM normal. No CVA tenderness. Cardio: regular rate and rhythm, S1, S2 normal, no murmur, click, rub or gallop GI: soft, non-tender; bowel sounds normal; no masses,  no organomegaly Extremities: extremities normal, atraumatic, no cyanosis or edema and Homans sign is negative, no sign of DVT Neurologic: Grossly normal Patient has full DP and PT pulses bilaterally    Diagnostic Studies & Laboratory data:     Recent Radiology Findings:  Ct Chest W Contrast/Ct Abdomen Pelvis W Contrast  07/20/2015  CLINICAL DATA:  Adenocarcinoma of the esophagogastric junction diagnosed on 03/08/2015, status post radiation therapy and chemotherapy completed 8 weeks prior, presenting for restaging. EXAM: CT CHEST, ABDOMEN, AND PELVIS WITH CONTRAST TECHNIQUE: Multidetector CT imaging of the chest, abdomen and pelvis was performed following the standard protocol during bolus administration of intravenous contrast. CONTRAST:  119m ISOVUE-300 IOPAMIDOL (ISOVUE-300) INJECTION 61% COMPARISON:  04/12/2015 PET-CT. 03/27/2015 chest CT. 02/17/2015 CT abdomen. FINDINGS: CT CHEST FINDINGS Mediastinum/Nodes: Normal heart size. No pericardial fluid/thickening. Left anterior descending, left circumflex and right coronary  atherosclerosis. Great vessels are normal in course and caliber. There is an acute segmental pulmonary embolus within the anterior left lower lobe (series 3/image 41 and series 601/ image 82). No additional central pulmonary emboli are detected. Normal visualized thyroid. There is nonspecific circumferential mild wall thickening in the lower thoracic esophagus extending into the proximal gastric cardia, not definitely changed since 03/27/2015. No axillary adenopathy. Stable borderline enlarged 1.0 cm left subcarinal node (series 3/ image 35), stable since 03/27/2015. No new mediastinal adenopathy. No hilar adenopathy. Lungs/Pleura: No pneumothorax. No pleural effusion. There are 6 scattered subcentimeter pulmonary nodules (1 in the posterior right upper lobe, 2 in the right lower lobe and 3 in the left lower lobe), largest 6 mm in the right lower lobe (series 4/ image 39), all unchanged since 05/14/2012, in keeping with a benign etiology. No acute consolidative airspace disease, new significant pulmonary nodules or lung masses. Musculoskeletal: No aggressive appearing focal osseous lesions. Mild-to-moderate degenerative changes in the thoracic spine. Stable mild symmetric gynecomastia. CT ABDOMEN PELVIS FINDINGS Hepatobiliary: Normal liver with no liver mass. Normal gallbladder with no radiopaque cholelithiasis. No biliary ductal dilatation. Pancreas: Normal, with no mass or duct dilation. Spleen: Normal size. No mass. Adrenals/Urinary Tract: Normal right adrenal. Mild irregular thickening of the left adrenal gland up to 1.0 cm in thickness, unchanged since 05/14/12, and benign. Simple 1.6 cm upper right renal cyst. Simple 3.5 cm lateral interpolar right renal cyst. Simple 1.8 cm medial right lower renal cyst. Simple 2.9 cm anterior interpolar left renal cyst. A few additional subcentimeter hypodense renal lesions scattered throughout both kidneys are too small to characterize and not appreciably changed. No  hydronephrosis. Normal bladder. Stomach/Bowel: No new sites of wall thickening in the nondistended stomach. Normal caliber small bowel with no small bowel wall thickening. Stable small periampullary duodenal diverticulum. Normal appendix. Normal large bowel with no diverticulosis, large bowel wall thickening or pericolonic fat stranding. Vascular/Lymphatic: Atherosclerotic nonaneurysmal abdominal aorta. Patent portal, splenic, hepatic and renal veins. No pathologically enlarged lymph nodes in the abdomen or pelvis. Reproductive: Stable top-normal size prostate with nonspecific internal prostatic calcification. Other: No pneumoperitoneum, ascites or focal fluid collection. Musculoskeletal: No aggressive appearing focal osseous lesions. Mild degenerative changes in the lumbar spine. IMPRESSION: 1. Incidentally detected acute segmental pulmonary embolus within the left lower lobe. No secondary evidence of pulmonary arterial hypertension  or right heart strain. 2. Stable nonspecific mild circumferential wall thickening in the lower thoracic esophagus extending into the proximal gastric cardia, which could represent post treatment change or residual neoplasm. 3. No evidence of metastatic disease in the chest, abdomen or pelvis. 4. Three-vessel coronary atherosclerosis. Critical Value/emergent results were called by telephone at the time of interpretation on 07/20/2015 at 1:29 pm to Dr. Lanelle Bal , who verbally acknowledged these results. Electronically Signed   By: Ilona Sorrel M.D.   On: 07/20/2015 13:41    I reviewed the CT scan with additional the radiologist  and question soft finding of possible incidentally noted left lower lobe subsegmental clot, although this study was not specifically timed with contrast to rule in or out PE. We did proceed after his visit in the office today to get a lower extremity duplex scan which ruled out any evidence of DVT. This point we'll continue with the above outlined plan for  surgery and anticoagulation post op when safe    I have independently reviewed the above radiologic studies.  Recent Lab Findings: Lab Results  Component Value Date   WBC 6.9 07/25/2015   HGB 9.2* 07/25/2015   HCT 27.0* 07/25/2015   PLT PENDING 07/25/2015   GLUCOSE 132* 07/25/2015   CHOL 170 12/02/2014   TRIG * 12/02/2014    449.0 Triglyceride is over 400; calculations on Lipids are invalid.   HDL 32.40* 12/02/2014   LDLDIRECT 93.0 12/02/2014   LDLCALC 94 06/17/2014   ALT 26 07/24/2015   AST 32 07/24/2015   NA 137 07/25/2015   K 4.8 07/25/2015   CL 105 07/25/2015   CREATININE 1.57* 07/25/2015   BUN 17 07/25/2015   CO2 25 07/25/2015   TSH 2.50 12/02/2014   INR 1.08 07/24/2015   HGBA1C 9.2 06/05/2015   EUS: Endoscopic findings: 1. The GE junction was nodular, ulcerated and friable. There was a mild stricture that allowed passage of the larger diameter radial echoendoscope with only mild resistance. 2. The upper GI tract was otherwise normal EUS findings: 1. There was obvious hypoechoic, non-circumferential, eccentric mass at the GE junction. This was approximately 4 cm long and the bulk of the mass lay on the gastric side of the GE junction. The mass was 1.2 cm thick maximally. It was 3.7 cm across. The mass clearly invades into and through muscularis propria (uT3). 2. There was a small but suspicious paraesophageal lymph node located 2 cm proximal to the mass this was 6.4 mm in diameter, round, hypoechoic, discrete (uN1). ENDOSCOPIC IMPRESSION: 4cm long, 3.7cm across eccentric (non-circumferential) uT3N1 (Stage IIIA) GE junction adenocarinoma. The bulk of the tumor lays on the gastric side of the GE junction.  PATH: Patient: Ronald Johnson, Ronald Johnson Collected: 03/08/2015 Client: [facility] Accession: [id number] Diagnosis Surgical [P], distal esophagus - ADENOCARCINOMA, SEE COMMENT. Microscopic Comment The adenocarcinoma involves at least lamina propria and  is arising in the background of intestinal metaplasia associated high grade glandular dysplasia. The case is reviewed with Dr Lyndon Code who concurs. The case was discussed with Dr. Henrene Pastor on 03/13/15. (CRR:gt, 03/13/15) [place] RUND DO   2007: 1. ESOPHAGUS, BIOPSY: ESOPHAGEAL MUCOSA WITH INTESTINAL METAPLASIA CONSISTENT WITH [name]' S MUCOSA AND FOCAL GLANDULAR ATYPIA CONSISTENT WITH LOW GRADE GLANDULAR DYSPLASIA. NO HIGH GRADE DYSPLASIA OR MALIGNANCY IDENTIFIED.  2013: 5. Surgical [P], esophagus, bx - SQUAMOCOLUMNAR MUCOSA WITH SQUAMOUS EPITHELIAL CHANGES CONSISTENT WITH REFLUX RELATED INJURY. - NEGATIVE FOR INTESTINAL METAPLASIA WITH GOBLET CELLS ([name]'S ESOPHAGUS). - NEGATIVE FOR MORPHOLOGICAL FEATURES OF  EOSINOPHILIC ESOPHAGITIS. - REACTIVE AND INFLAMED GLANDULAR/COLUMNAR MUCOSA.  Assessment / Plan:   1) 4cm long, 3.7cm across eccentric (non-circumferential) uT3N1 (Stage IIIA) GE junction adenocarinoma, [place]ard type III associated with [name]'s .  Patient now has completed 5 cycles of chemotherapy and  radiation therapy . I have recommended proceeding with  transhiatal total esophagectomy and cervical esophagogastrostomy and feeding jejunostomy tube. 2) known COPD, full PFTs done 3) isotope stress test was done recently without evidence of ischemia,     I had a detailed discussion with [name] regarding the magnitude of the surgical esophagectomy procedure as well as the risks, the expected benefits, and alternatives.  I quoted [name] 5% perioperative mortality rate and a complication rate as high as 40%.  We specifically discussed complications, which include, but were not limited to: recurrent nerve injury with possible permanent hoarseness, anastomotic leak, airway and great vessel injury, conduit ischemia, thoracic duct leak, the inability to complete the operation via a transhiatal approach requiring a right thoracotomy,  bleeding, need for blood transfusion and the  potential need for ventilator support.  [name] has had questions answered is well informed and willing to proceed.    [name] MD      [address] [place] [address] Office [phone]   Beeper [phone]  07/25/2015 5:21 PM

## 2015-07-25 NOTE — Progress Notes (Signed)
      [address]       [place] [address]             [phone]      S/p esophagectomy  Intubated but starting to wake up  BP 130/64 mmHg  Pulse 80  Temp(Src) 99.2 F (37.3 C) (Oral)  Resp 22  Ht 6\' 4"  (1.93 m)  Wt 250 lb (113.399 kg)  BMI 30.44 kg/m2  SpO2 100%   Intake/Output Summary (Last 24 hours) at 07/25/15 1718 Last data filed at 07/25/15 1700  Gross per 24 hour  Intake 4526.7 ml  Output   1255 ml  Net 3271.7 ml    Doing well early postop  Wean vent as tolerated  Remo Lipps C. [name], MD [facility] [phone]

## 2015-07-26 ENCOUNTER — Encounter (HOSPITAL_COMMUNITY): Payer: Self-pay | Admitting: Cardiothoracic Surgery

## 2015-07-26 ENCOUNTER — Inpatient Hospital Stay (HOSPITAL_COMMUNITY): Payer: Medicare Other

## 2015-07-26 LAB — POCT I-STAT 4, (NA,K, GLUC, HGB,HCT)
Glucose, Bld: 157 mg/dL — ABNORMAL HIGH (ref 65–99)
Glucose, Bld: 167 mg/dL — ABNORMAL HIGH (ref 65–99)
Glucose, Bld: 166 mg/dL — ABNORMAL HIGH (ref 65–99)
Glucose, Bld: 164 mg/dL — ABNORMAL HIGH (ref 65–99)
Glucose, Bld: 177 mg/dL — ABNORMAL HIGH (ref 65–99)
Glucose, Bld: 154 mg/dL — ABNORMAL HIGH (ref 65–99)
HCT: 29 % — ABNORMAL LOW (ref 39.0–52.0)
HCT: 27 % — ABNORMAL LOW (ref 39.0–52.0)
HCT: 30 % — ABNORMAL LOW (ref 39.0–52.0)
HCT: 28 % — ABNORMAL LOW (ref 39.0–52.0)
HCT: 26 % — ABNORMAL LOW (ref 39.0–52.0)
HCT: 26 % — ABNORMAL LOW (ref 39.0–52.0)
Hemoglobin: 9.9 g/dL — ABNORMAL LOW (ref 13.0–17.0)
Hemoglobin: 9.2 g/dL — ABNORMAL LOW (ref 13.0–17.0)
Hemoglobin: 10.2 g/dL — ABNORMAL LOW (ref 13.0–17.0)
Hemoglobin: 9.5 g/dL — ABNORMAL LOW (ref 13.0–17.0)
Hemoglobin: 8.8 g/dL — ABNORMAL LOW (ref 13.0–17.0)
Hemoglobin: 8.8 g/dL — ABNORMAL LOW (ref 13.0–17.0)
Potassium: 4.7 mmol/L (ref 3.5–5.1)
Potassium: 4.9 mmol/L (ref 3.5–5.1)
Potassium: 4.8 mmol/L (ref 3.5–5.1)
Potassium: 4.5 mmol/L (ref 3.5–5.1)
Potassium: 4.5 mmol/L (ref 3.5–5.1)
Potassium: 4.6 mmol/L (ref 3.5–5.1)
Sodium: 136 mmol/L (ref 135–145)
Sodium: 136 mmol/L (ref 135–145)
Sodium: 136 mmol/L (ref 135–145)
Sodium: 137 mmol/L (ref 135–145)
Sodium: 135 mmol/L (ref 135–145)
Sodium: 137 mmol/L (ref 135–145)

## 2015-07-26 LAB — POCT I-STAT 7, (LYTES, BLD GAS, ICA,H+H)
Acid-Base Excess: 1 mmol/L (ref 0.0–2.0)
Bicarbonate: 26.8 mEq/L — ABNORMAL HIGH (ref 20.0–24.0)
Bicarbonate: 25.2 mEq/L — ABNORMAL HIGH (ref 20.0–24.0)
Calcium, Ion: 1.23 mmol/L (ref 1.13–1.30)
Calcium, Ion: 1.14 mmol/L (ref 1.13–1.30)
HCT: 29 % — ABNORMAL LOW (ref 39.0–52.0)
HCT: 29 % — ABNORMAL LOW (ref 39.0–52.0)
Hemoglobin: 9.9 g/dL — ABNORMAL LOW (ref 13.0–17.0)
Hemoglobin: 9.9 g/dL — ABNORMAL LOW (ref 13.0–17.0)
O2 Saturation: 99 %
O2 Saturation: 99 %
Patient temperature: 36.5
Patient temperature: 37.2
Potassium: 4.7 mmol/L (ref 3.5–5.1)
Potassium: 4.5 mmol/L (ref 3.5–5.1)
Sodium: 136 mmol/L (ref 135–145)
Sodium: 136 mmol/L (ref 135–145)
TCO2: 28 mmol/L (ref 0–100)
TCO2: 27 mmol/L (ref 0–100)
pCO2 arterial: 46.1 mmHg — ABNORMAL HIGH (ref 35.0–45.0)
pCO2 arterial: 45.3 mmHg — ABNORMAL HIGH (ref 35.0–45.0)
pH, Arterial: 7.371 (ref 7.350–7.450)
pH, Arterial: 7.354 (ref 7.350–7.450)
pO2, Arterial: 144 mmHg — ABNORMAL HIGH (ref 80.0–100.0)
pO2, Arterial: 169 mmHg — ABNORMAL HIGH (ref 80.0–100.0)

## 2015-07-26 LAB — BASIC METABOLIC PANEL
ANION GAP: 7 (ref 5–15)
BUN: 17 mg/dL (ref 6–20)
CALCIUM: 8 mg/dL — AB (ref 8.9–10.3)
CO2: 24 mmol/L (ref 22–32)
CREATININE: 1.33 mg/dL — AB (ref 0.61–1.24)
Chloride: 105 mmol/L (ref 101–111)
GFR calc Af Amer: >60 mL/min (ref >60)
GFR, EST NON AFRICAN AMERICAN: 54 mL/min — AB (ref >60)
GLUCOSE: 142 mg/dL — AB (ref 65–99)
POTASSIUM: 4.3 mmol/L (ref 3.5–5.1)
Sodium: 136 mmol/L (ref 135–145)

## 2015-07-26 LAB — CBC
HCT: 28.6 % — ABNORMAL LOW (ref 39.0–52.0)
HCT: 29.6 % — ABNORMAL LOW (ref 39.0–52.0)
Hemoglobin: 9.5 g/dL — ABNORMAL LOW (ref 13.0–17.0)
Hemoglobin: 9.4 g/dL — ABNORMAL LOW (ref 13.0–17.0)
MCH: 32.9 pg (ref 26.0–34.0)
MCH: 32 pg (ref 26.0–34.0)
MCHC: 33.2 g/dL (ref 30.0–36.0)
MCHC: 31.8 g/dL (ref 30.0–36.0)
MCV: 99 fL (ref 78.0–100.0)
MCV: 100.7 fL — ABNORMAL HIGH (ref 78.0–100.0)
PLATELETS: 89 10*3/uL — AB (ref 150–400)
Platelets: 109 10*3/uL — ABNORMAL LOW (ref 150–400)
RBC: 2.89 MIL/uL — ABNORMAL LOW (ref 4.22–5.81)
RBC: 2.94 MIL/uL — ABNORMAL LOW (ref 4.22–5.81)
RDW: 13.3 % (ref 11.5–15.5)
RDW: 13.7 % (ref 11.5–15.5)
WBC: 6.5 10*3/uL (ref 4.0–10.5)
WBC: 8.8 10*3/uL (ref 4.0–10.5)

## 2015-07-26 LAB — GLUCOSE, CAPILLARY
Glucose-Capillary: 125 mg/dL — ABNORMAL HIGH (ref 65–99)
Glucose-Capillary: 127 mg/dL — ABNORMAL HIGH (ref 65–99)
Glucose-Capillary: 132 mg/dL — ABNORMAL HIGH (ref 65–99)
Glucose-Capillary: 133 mg/dL — ABNORMAL HIGH (ref 65–99)
Glucose-Capillary: 136 mg/dL — ABNORMAL HIGH (ref 65–99)
Glucose-Capillary: 136 mg/dL — ABNORMAL HIGH (ref 65–99)
Glucose-Capillary: 144 mg/dL — ABNORMAL HIGH (ref 65–99)
Glucose-Capillary: 113 mg/dL — ABNORMAL HIGH (ref 65–99)
Glucose-Capillary: 116 mg/dL — ABNORMAL HIGH (ref 65–99)
Glucose-Capillary: 117 mg/dL — ABNORMAL HIGH (ref 65–99)
Glucose-Capillary: 123 mg/dL — ABNORMAL HIGH (ref 65–99)
Glucose-Capillary: 123 mg/dL — ABNORMAL HIGH (ref 65–99)
Glucose-Capillary: 126 mg/dL — ABNORMAL HIGH (ref 65–99)
Glucose-Capillary: 127 mg/dL — ABNORMAL HIGH (ref 65–99)
Glucose-Capillary: 129 mg/dL — ABNORMAL HIGH (ref 65–99)
Glucose-Capillary: 133 mg/dL — ABNORMAL HIGH (ref 65–99)
Glucose-Capillary: 136 mg/dL — ABNORMAL HIGH (ref 65–99)
Glucose-Capillary: 138 mg/dL — ABNORMAL HIGH (ref 65–99)
Glucose-Capillary: 149 mg/dL — ABNORMAL HIGH (ref 65–99)
Glucose-Capillary: 95 mg/dL (ref 65–99)
Glucose-Capillary: 155 mg/dL — ABNORMAL HIGH (ref 65–99)
Glucose-Capillary: 202 mg/dL — ABNORMAL HIGH (ref 65–99)

## 2015-07-26 LAB — BLOOD GAS, ARTERIAL
ACID-BASE DEFICIT: 1.8 mmol/L (ref 0.0–2.0)
BICARBONATE: 23.3 meq/L (ref 20.0–24.0)
Drawn by: 44166
O2 Content: 4 L/min
O2 SAT: 96.5 %
PATIENT TEMPERATURE: 98.6
PCO2 ART: 45.2 mmHg — AB (ref 35.0–45.0)
PO2 ART: 94.3 mmHg (ref 80.0–100.0)
TCO2: 24.7 mmol/L (ref 0–100)
pH, Arterial: 7.332 — ABNORMAL LOW (ref 7.350–7.450)

## 2015-07-26 LAB — POCT I-STAT, CHEM 8
BUN: 18 mg/dL (ref 6–20)
Calcium, Ion: 1.11 mmol/L — ABNORMAL LOW (ref 1.13–1.30)
Chloride: 104 mmol/L (ref 101–111)
Creatinine, Ser: 1.3 mg/dL — ABNORMAL HIGH (ref 0.61–1.24)
Glucose, Bld: 194 mg/dL — ABNORMAL HIGH (ref 65–99)
HCT: 30 % — ABNORMAL LOW (ref 39.0–52.0)
Hemoglobin: 10.2 g/dL — ABNORMAL LOW (ref 13.0–17.0)
Potassium: 4.6 mmol/L (ref 3.5–5.1)
Sodium: 140 mmol/L (ref 135–145)
TCO2: 24 mmol/L (ref 0–100)

## 2015-07-26 LAB — MAGNESIUM: Magnesium: 1.8 mg/dL (ref 1.7–2.4)

## 2015-07-26 LAB — CREATININE, SERUM
Creatinine, Ser: 1.47 mg/dL — ABNORMAL HIGH (ref 0.61–1.24)
GFR calc Af Amer: 55 mL/min — ABNORMAL LOW (ref >60)
GFR calc non Af Amer: 48 mL/min — ABNORMAL LOW (ref >60)

## 2015-07-26 MED ORDER — DIPHENHYDRAMINE HCL 12.5 MG/5ML PO ELIX: 12.5000 mg | ORAL_SOLUTION | Freq: Four times a day (QID) | ORAL | Status: DC | PRN

## 2015-07-26 MED ORDER — FENTANYL CITRATE (PF) 250 MCG/5ML IJ SOLN
INTRAMUSCULAR | Status: AC
  Filled 2015-07-26: qty 5

## 2015-07-26 MED ORDER — INSULIN DETEMIR 100 UNIT/ML ~~LOC~~ SOLN: 40.0000 [IU] | Freq: Two times a day (BID) | SUBCUTANEOUS | Status: DC

## 2015-07-26 MED ORDER — SODIUM CHLORIDE 0.9 % IJ SOLN: 9.0000 mL | INTRAMUSCULAR | Status: DC | PRN

## 2015-07-26 MED ORDER — NALOXONE HCL 0.4 MG/ML IJ SOLN: 0.4000 mg | INTRAMUSCULAR | Status: DC | PRN

## 2015-07-26 MED ORDER — INSULIN DETEMIR 100 UNIT/ML ~~LOC~~ SOLN: 40.0000 [IU] | Freq: Every day | SUBCUTANEOUS | Status: DC

## 2015-07-26 MED ORDER — INSULIN DETEMIR 100 UNIT/ML ~~LOC~~ SOLN
40.0000 [IU] | Freq: Every day | SUBCUTANEOUS | Status: DC
  Administered 2015-07-26: 40 [IU] via SUBCUTANEOUS
  Filled 2015-07-26: qty 0.4

## 2015-07-26 MED ORDER — OXYCODONE HCL 5 MG/5ML PO SOLN: 5.0000 mg | Freq: Once | ORAL | Status: DC | PRN

## 2015-07-26 MED ORDER — ONDANSETRON HCL 4 MG/2ML IJ SOLN: 4.0000 mg | Freq: Four times a day (QID) | INTRAMUSCULAR | Status: DC | PRN

## 2015-07-26 MED ORDER — DEXMEDETOMIDINE HCL IN NACL 200 MCG/50ML IV SOLN
INTRAVENOUS | Status: AC
  Filled 2015-07-26: qty 50

## 2015-07-26 MED ORDER — DIPHENHYDRAMINE HCL 50 MG/ML IJ SOLN
12.5000 mg | Freq: Four times a day (QID) | INTRAMUSCULAR | Status: DC | PRN
  Administered 2015-08-01: 12.5 mg via INTRAVENOUS
  Filled 2015-07-26 (×2): qty 1

## 2015-07-26 MED ORDER — PROPOFOL 10 MG/ML IV BOLUS
INTRAVENOUS | Status: AC
  Filled 2015-07-26: qty 20

## 2015-07-26 MED ORDER — ROCURONIUM BROMIDE 50 MG/5ML IV SOLN
INTRAVENOUS | Status: AC
  Filled 2015-07-26: qty 1

## 2015-07-26 MED ORDER — DIPHENHYDRAMINE HCL 50 MG/ML IJ SOLN: 12.5000 mg | Freq: Four times a day (QID) | INTRAMUSCULAR | Status: DC | PRN

## 2015-07-26 MED ORDER — INSULIN ASPART 100 UNIT/ML ~~LOC~~ SOLN
0.0000 [IU] | SUBCUTANEOUS | Status: DC
  Administered 2015-07-26 (×2): 2 [IU] via SUBCUTANEOUS

## 2015-07-26 MED ORDER — INSULIN DETEMIR 100 UNIT/ML ~~LOC~~ SOLN
40.0000 [IU] | Freq: Two times a day (BID) | SUBCUTANEOUS | Status: DC
  Administered 2015-07-26: 40 [IU] via SUBCUTANEOUS
  Filled 2015-07-26 (×3): qty 0.4

## 2015-07-26 MED ORDER — HYDROMORPHONE HCL 1 MG/ML IJ SOLN
1.0000 mg | Freq: Once | INTRAMUSCULAR | Status: AC
  Administered 2015-07-26: 1 mg via INTRAVENOUS
  Filled 2015-07-26: qty 1

## 2015-07-26 MED ORDER — HYDROMORPHONE 1 MG/ML IV SOLN: INTRAVENOUS | Status: DC

## 2015-07-26 MED ORDER — OXYCODONE HCL 5 MG PO TABS: 5.0000 mg | ORAL_TABLET | Freq: Once | ORAL | Status: DC | PRN

## 2015-07-26 MED ORDER — HYDROMORPHONE 1 MG/ML IV SOLN
INTRAVENOUS | Status: DC
  Administered 2015-07-26: 1.2 mg via INTRAVENOUS
  Administered 2015-07-26: 0.6 mg via INTRAVENOUS
  Administered 2015-07-26: 09:00:00 via INTRAVENOUS
  Administered 2015-07-26: 1.1 mg via INTRAVENOUS
  Administered 2015-07-27: 2.2 mg via INTRAVENOUS
  Administered 2015-07-27: 1.4 mg via INTRAVENOUS
  Administered 2015-07-27: 0.8 mg via INTRAVENOUS
  Administered 2015-07-27: 1.6 mg via INTRAVENOUS
  Administered 2015-07-27: 2 mg via INTRAVENOUS
  Administered 2015-07-27: 0.6 mg via INTRAVENOUS
  Administered 2015-07-28: 1 mg via INTRAVENOUS
  Administered 2015-07-28: 1.4 mg via INTRAVENOUS
  Administered 2015-07-28: 2.2 mg via INTRAVENOUS
  Administered 2015-07-28: 1.4 mg via INTRAVENOUS
  Administered 2015-07-28: 0.6 mg via INTRAVENOUS
  Administered 2015-07-28: 1.4 mg via INTRAVENOUS
  Administered 2015-07-29: 1.2 mg via INTRAVENOUS
  Administered 2015-07-29: 1.8 mg via INTRAVENOUS
  Administered 2015-07-29: 1 mg via INTRAVENOUS
  Administered 2015-07-29: 1.4 mg via INTRAVENOUS
  Administered 2015-07-29: 1.8 mg via INTRAVENOUS
  Administered 2015-07-29: 0.2 mg via INTRAVENOUS
  Administered 2015-07-30: 1.8 mg via INTRAVENOUS
  Administered 2015-07-30: 1 mg via INTRAVENOUS
  Administered 2015-07-30: 1.6 mg via INTRAVENOUS
  Administered 2015-07-30: 1.2 mg via INTRAVENOUS
  Administered 2015-07-30 – 2015-07-31 (×3): 0.8 mg via INTRAVENOUS
  Administered 2015-07-31: 3 mg via INTRAVENOUS
  Administered 2015-07-31: 0.6 mg via INTRAVENOUS
  Administered 2015-07-31: 1.8 mg via INTRAVENOUS
  Administered 2015-08-01: 0.4 mg via INTRAVENOUS
  Administered 2015-08-01: 1.2 mg via INTRAVENOUS
  Administered 2015-08-01: 0.4 mg via INTRAVENOUS
  Administered 2015-08-01: 1.4 mg via INTRAVENOUS
  Administered 2015-08-01: 0.6 mg via INTRAVENOUS
  Administered 2015-08-01: 1.8 mg via INTRAVENOUS
  Filled 2015-07-26 (×3): qty 25

## 2015-07-26 MED ORDER — ENOXAPARIN SODIUM 40 MG/0.4ML ~~LOC~~ SOLN
40.0000 mg | SUBCUTANEOUS | Status: DC
  Administered 2015-07-26 – 2015-07-28 (×3): 40 mg via SUBCUTANEOUS
  Filled 2015-07-26 (×3): qty 0.4

## 2015-07-26 MED ORDER — HYDROMORPHONE HCL 1 MG/ML IJ SOLN: 0.2500 mg | INTRAMUSCULAR | Status: DC | PRN

## 2015-07-26 MED ORDER — INSULIN ASPART 100 UNIT/ML ~~LOC~~ SOLN
0.0000 [IU] | SUBCUTANEOUS | Status: DC
Start: 2015-07-26 — End: 2015-08-06
  Administered 2015-07-26 – 2015-07-27 (×2): 12 [IU] via SUBCUTANEOUS
  Administered 2015-07-27: 8 [IU] via SUBCUTANEOUS
  Administered 2015-07-27: 12 [IU] via SUBCUTANEOUS
  Administered 2015-07-27 (×4): 4 [IU] via SUBCUTANEOUS
  Administered 2015-07-28: 2 [IU] via SUBCUTANEOUS
  Administered 2015-07-28 – 2015-08-01 (×10): 4 [IU] via SUBCUTANEOUS
  Administered 2015-08-02: 12 [IU] via SUBCUTANEOUS
  Administered 2015-08-02: 8 [IU] via SUBCUTANEOUS
  Administered 2015-08-02: 4 [IU] via SUBCUTANEOUS
  Administered 2015-08-02: 12 [IU] via SUBCUTANEOUS
  Administered 2015-08-02: 4 [IU] via SUBCUTANEOUS
  Administered 2015-08-03 (×2): 8 [IU] via SUBCUTANEOUS
  Administered 2015-08-03 (×2): 12 [IU] via SUBCUTANEOUS
  Administered 2015-08-03: 4 [IU] via SUBCUTANEOUS
  Administered 2015-08-04: 12 [IU] via SUBCUTANEOUS
  Administered 2015-08-04: 4 [IU] via SUBCUTANEOUS
  Administered 2015-08-04: 8 [IU] via SUBCUTANEOUS
  Administered 2015-08-04: 12 [IU] via SUBCUTANEOUS
  Administered 2015-08-04: 8 [IU] via SUBCUTANEOUS
  Administered 2015-08-04 – 2015-08-05 (×2): 4 [IU] via SUBCUTANEOUS
  Administered 2015-08-05 (×2): 12 [IU] via SUBCUTANEOUS
  Administered 2015-08-06: 4 [IU] via SUBCUTANEOUS
  Administered 2015-08-06: 12 [IU] via SUBCUTANEOUS

## 2015-07-26 NOTE — Anesthesia Postprocedure Evaluation (Signed)
Anesthesia Post Note  Patient: Ronald Johnson  Procedure(s) Performed: Procedure(s) (LRB): TRANSHIATAL TOTAL ESOPHAGECTOMY COMPLETE PYLOROMYOTOMY (N/A) FEEDING JEJUNOSTOMY (N/A) VIDEO BRONCHOSCOPY (N/A)  Patient location during evaluation: ICU Anesthesia Type: General Level of consciousness: sedated Pain management: pain level controlled Vital Signs Assessment: post-procedure vital signs reviewed and stable Respiratory status: patient remains intubated per anesthesia plan Cardiovascular status: stable Anesthetic complications: no    Last Vitals:  Filed Vitals:   07/26/15 0927 07/26/15 1005  BP:  115/65  Pulse:  95  Temp:    Resp: 18 17    Last Pain:  Filed Vitals:   07/26/15 1008  PainSc: 0-No pain                 Tecora Eustache S

## 2015-07-26 NOTE — Plan of Care (Signed)
Problem: Activity: Goal: Ability to tolerate increased activity will improve Outcome: Progressing Pt OOB this AM  Problem: Bowel/Gastric: Goal: Gastrointestinal status for postoperative course will improve Outcome: Progressing Pt still NPO  Problem: Nutrition: Goal: Ability to attain and maintain optimal nutritional status will improve Outcome: Not Progressing Pt remains NPO  Problem: Respiratory: Goal: Ability to maintain adequate ventilation will improve Outcome: Completed/Met Date Met:  07/26/15 Pt extubated 07/25/2015  Problem: Pain Management: Goal: Pain level will decrease Outcome: Not Progressing Pt  Still requiring large amounts of pain medication.

## 2015-07-26 NOTE — Progress Notes (Signed)
Utilization Review Completed.Ronald Johnson T1/27/2016  

## 2015-07-26 NOTE — Progress Notes (Addendum)
[address]       [place] [address]             [phone]      1 Day Post-Op Procedure(s) (LRB): TRANSHIATAL TOTAL ESOPHAGECTOMY COMPLETE PYLOROMYOTOMY (N/A) FEEDING JEJUNOSTOMY (N/A) VIDEO BRONCHOSCOPY (N/A)   Subjective:  Ronald Johnson states he is not doing too good this morning.  He complains of abdominal pain at incision site and states medications are not providing much relief.  He denies nausea, vomiting  Objective: Vital signs in last 24 hours: Temp:  [97.7 F (36.5 C)-99.2 F (37.3 C)] 97.8 F (36.6 C) (12/07 0349) Pulse Rate:  [62-95] 89 (12/07 0700) Cardiac Rhythm:  [-] Normal sinus rhythm (12/07 0400) Resp:  [12-25] 19 (12/07 0700) BP: (92-148)/(45-79) 134/70 mmHg (12/07 0700) SpO2:  [95 %-100 %] 97 % (12/07 0700) Arterial Line BP: (83-159)/(38-73) 123/50 mmHg (12/07 0700) FiO2 (%):  [40 %-50 %] 40 % (12/06 2030) Weight:  [258 lb 4.8 oz (117.164 kg)] 258 lb 4.8 oz (117.164 kg) (12/07 0500)  Intake/Output from previous day: 12/06 0701 - 12/07 0700 In: 6450.1 [I.V.:5320.1; IV Piggyback:1100] Out: 2200 [Urine:1335; Emesis/NG output:140; Blood:575; Chest Tube:150]  General appearance: alert, cooperative and no distress Heart: regular rate and rhythm Lungs: clear to auscultation bilaterally Abdomen: soft, + distention, tender at incision Wound: aquacel in place on abdominal incision, neck C/D?I  Lab Results:  Recent Labs  07/25/15 1600 07/25/15 1604 07/26/15 0305  WBC 6.9  --  6.5  HGB 9.4* 9.2* 9.5*  HCT 27.9* 27.0* 28.6*  PLT PLATELET CLUMPS NOTED ON SMEAR, COUNT APPEARS DECREASED  --  89*   BMET:  Recent Labs  07/25/15 1600 07/25/15 1604 07/26/15 0305  NA 137 137 136  K 4.9 4.8 4.3  CL 105  --  105  CO2 25  --  24  GLUCOSE 134* 132* 142*  BUN 17  --  17  CREATININE 1.57*  --  1.33*  CALCIUM 8.1*  --  8.0*    PT/INR:  Recent Labs  07/24/15 1614  LABPROT 14.2  INR 1.08   ABG    Component Value Date/Time   PHART 7.332* 07/26/2015 0438   HCO3 23.3 07/26/2015 0438   TCO2 24.7 07/26/2015 0438   ACIDBASEDEF 1.8 07/26/2015 0438   O2SAT 96.5 07/26/2015 0438   CBG (last 3)   Recent Labs  07/25/15 2153 07/25/15 2255 07/26/15  GLUCAP 133* 136* 144*    Assessment/Plan: S/P Procedure(s) (LRB): TRANSHIATAL TOTAL ESOPHAGECTOMY COMPLETE PYLOROMYOTOMY (N/A) FEEDING JEJUNOSTOMY (N/A) VIDEO BRONCHOSCOPY (N/A)  1. CV- hemodynamically stable off all drips 2. Pulm- wean oxygen as tolerated, CXR free from pneumothorax, some atelectasis, no significant effusions, CT with 150 ml output since surgery, leave in place 3. Renal- creatinine down to 1.33 4. Pain control- on IV morphine without relief, will d/c start Dilaudid PCA, avoid Toradol for now with elevated creatinine 5. GI- remain NPO, continue IV fluids 6. CBGs controlled, currently on insulin drip 7. Dispo- patient stable, other than pain control, will try Dilaudid to see if we can get pain under control   LOS: 1 day    Johnson, Ronald 07/26/2015  Manage dm, convert from drip to levimir With question of PE ? Chronic will start lovenox 40 daily, watch plt count closely Voice good, says E no evidence of cord problem I have seen and examined [name] and agree with the above assessment  and plan.  Ronald Isaac MD Beeper  [phone] Office [phone] 07/26/2015 8:25 AM

## 2015-07-26 NOTE — Progress Notes (Signed)
Patient ID: Ronald Johnson, male   DOB: 21-Aug-1945, 69 y.o.   MRN: [id number]  SICU Evening Rounds:  Hemodynamically stable in sinus rhythm. Sats 100% Urine output good Up in chair and comfortable.

## 2015-07-26 NOTE — Progress Notes (Signed)
abg collected from arterial line.

## 2015-07-27 ENCOUNTER — Inpatient Hospital Stay (HOSPITAL_COMMUNITY): Payer: Medicare Other

## 2015-07-27 LAB — GLUCOSE, CAPILLARY
Glucose-Capillary: 142 mg/dL — ABNORMAL HIGH (ref 65–99)
Glucose-Capillary: 148 mg/dL — ABNORMAL HIGH (ref 65–99)
Glucose-Capillary: 157 mg/dL — ABNORMAL HIGH (ref 65–99)
Glucose-Capillary: 159 mg/dL — ABNORMAL HIGH (ref 65–99)
Glucose-Capillary: 177 mg/dL — ABNORMAL HIGH (ref 65–99)
Glucose-Capillary: 211 mg/dL — ABNORMAL HIGH (ref 65–99)
Glucose-Capillary: 246 mg/dL — ABNORMAL HIGH (ref 65–99)

## 2015-07-27 LAB — BASIC METABOLIC PANEL
Anion gap: 4 — ABNORMAL LOW (ref 5–15)
BUN: 19 mg/dL (ref 6–20)
CO2: 25 mmol/L (ref 22–32)
Calcium: 8.1 mg/dL — ABNORMAL LOW (ref 8.9–10.3)
Chloride: 109 mmol/L (ref 101–111)
Creatinine, Ser: 1.32 mg/dL — ABNORMAL HIGH (ref 0.61–1.24)
GFR calc Af Amer: >60 mL/min (ref >60)
GFR calc non Af Amer: 54 mL/min — ABNORMAL LOW (ref >60)
Glucose, Bld: 220 mg/dL — ABNORMAL HIGH (ref 65–99)
Potassium: 4.6 mmol/L (ref 3.5–5.1)
Sodium: 138 mmol/L (ref 135–145)

## 2015-07-27 LAB — CBC
HCT: 27.6 % — ABNORMAL LOW (ref 39.0–52.0)
Hemoglobin: 8.7 g/dL — ABNORMAL LOW (ref 13.0–17.0)
MCH: 32 pg (ref 26.0–34.0)
MCHC: 31.5 g/dL (ref 30.0–36.0)
MCV: 101.5 fL — ABNORMAL HIGH (ref 78.0–100.0)
Platelets: 96 10*3/uL — ABNORMAL LOW (ref 150–400)
RBC: 2.72 MIL/uL — ABNORMAL LOW (ref 4.22–5.81)
RDW: 13.7 % (ref 11.5–15.5)
WBC: 7.9 10*3/uL (ref 4.0–10.5)

## 2015-07-27 MED ORDER — ALPRAZOLAM 0.25 MG PO TABS
0.2500 mg | ORAL_TABLET | Freq: Two times a day (BID) | ORAL | Status: DC | PRN
  Administered 2015-07-27 – 2015-08-02 (×5): 0.25 mg via ORAL
  Filled 2015-07-27 (×5): qty 1

## 2015-07-27 MED ORDER — VITAL AF 1.2 CAL PO LIQD
1000.0000 mL | ORAL | Status: DC
  Administered 2015-07-27: 1000 mL
  Filled 2015-07-27 (×3): qty 1000

## 2015-07-27 MED ORDER — INSULIN DETEMIR 100 UNIT/ML ~~LOC~~ SOLN
50.0000 [IU] | Freq: Two times a day (BID) | SUBCUTANEOUS | Status: DC
  Administered 2015-07-27 – 2015-07-30 (×8): 50 [IU] via SUBCUTANEOUS
  Filled 2015-07-27 (×10): qty 0.5

## 2015-07-27 MED ORDER — VITAL HIGH PROTEIN PO LIQD: 1000.0000 mL | ORAL | Status: DC

## 2015-07-27 NOTE — Progress Notes (Signed)
CT Surgery pm rounds  Up in chair w/o nausea nsr O2 sat 95% trickle TF started 20 / hr Doing well

## 2015-07-27 NOTE — Progress Notes (Signed)
Patient ID: Ronald Johnson, male   DOB: May 30, 1946, 69 y.o.   MRN: [id number] TCTS DAILY ICU PROGRESS NOTE                   [address]            [place] [address]          [phone]   2 Days Post-Op Procedure(s) (LRB): TRANSHIATAL TOTAL ESOPHAGECTOMY COMPLETE PYLOROMYOTOMY (N/A) FEEDING JEJUNOSTOMY (N/A) VIDEO BRONCHOSCOPY (N/A)  Total Length of Stay:  LOS: 2 days   Subjective: [place], walked around the unit, better pain control  Objective: Vital signs in last 24 hours: Temp:  [97.4 F (36.3 C)-98.6 F (37 C)] 97.4 F (36.3 C) (12/08 0744) Pulse Rate:  [86-115] 86 (12/08 0600) Cardiac Rhythm:  [-] Normal sinus rhythm (12/08 0415) Resp:  [9-25] 15 (12/08 0600) BP: (115-161)/(56-84) 159/72 mmHg (12/08 0600) SpO2:  [90 %-100 %] 100 % (12/08 0600) Arterial Line BP: (113-119)/(46-47) 113/46 mmHg (12/07 1100) FiO2 (%):  [98 %] 98 % (12/07 0927)  Filed Weights   07/25/15 0550 07/26/15 0500  Weight: 250 lb (113.399 kg) 258 lb 4.8 oz (117.164 kg)    Weight change:    Hemodynamic parameters for last 24 hours:    Intake/Output from previous day: 12/07 0701 - 12/08 0700 In: 3054.4 [I.V.:2904.4; NG/GT:40; IV Piggyback:50] Out: [id number] [Urine:1435; Emesis/NG output:210; Chest Tube:220]  Intake/Output this shift:    Current Meds: Scheduled Meds: . antiseptic oral rinse  7 mL Mouth Rinse q12n4p  . chlorhexidine  15 mL Mouth Rinse BID  . enoxaparin (LOVENOX) injection  40 mg Subcutaneous Q24H  . HYDROmorphone   Intravenous 6 times per day  . insulin aspart  0-24 Units Subcutaneous 6 times per day  . insulin detemir  40 Units Subcutaneous BID  . pantoprazole (PROTONIX) IV  40 mg Intravenous Q12H  . sodium chloride  10 mL Intravenous Q12H   Continuous Infusions: . dextrose 5 % and 0.9% NaCl 125 mL/hr at 07/27/15 0427   PRN Meds:.albuterol, diphenhydrAMINE **OR** diphenhydrAMINE, naloxone **AND** sodium chloride, ondansetron (ZOFRAN) IV, potassium  chloride  General appearance: alert, cooperative and no distress Neurologic: intact Heart: regular rate and rhythm, S1, S2 normal, no murmur, click, rub or gallop Lungs: diminished breath sounds bibasilar Abdomen: soft, non-tender; bowel sounds normal; no masses,  no organomegaly Extremities: extremities normal, atraumatic, no cyanosis or edema and Homans sign is negative, no sign of DVT Wound: wounds intact, minimial drainage from neck drain  Lab Results: CBC: Recent Labs  07/26/15 1608 07/26/15 1701 07/27/15 0342  WBC 8.8  --  7.9  HGB 9.4* 10.2* 8.7*  HCT 29.6* 30.0* 27.6*  PLT 109*  --  96*   BMET:  Recent Labs  07/26/15 0305  07/26/15 1701 07/27/15 0342  NA 136  --  140 138  K 4.3  --  4.6 4.6  CL 105  --  104 109  CO2 24  --   --  25  GLUCOSE 142*  --  194* 220*  BUN 17  --  18 19  CREATININE 1.33*  < > 1.30* 1.32*  CALCIUM 8.0*  --   --  8.1*  < > = values in this interval not displayed.  PT/INR:  Recent Labs  07/24/15 1614  LABPROT 14.2  INR 1.08   Radiology: Dg Chest Port 1 View  07/27/2015  CLINICAL DATA:  Status post total esophagectomy EXAM: PORTABLE CHEST 1 VIEW COMPARISON:  Portable  chest x-ray of July 26, 2015 FINDINGS: The lungs are adequately inflated. The interstitial markings are slightly less conspicuous overall today. They remain more conspicuous on the left than on the right. Left lower lobe atelectasis and small left pleural effusion are present. The left-sided chest tube is stable with its tip overlying the posterior portion of the ninth rib. Again noted is radiodense tape like structure overlying the left pulmonary apex unchanged from the previous study. The right lung is clear. The heart is normal in size. The pulmonary vascularity is only minimally prominent. The esophagogastric tube tip cannot be clearly discerned due to technical factors. The right internal jugular venous catheter tip is stable. IMPRESSION: 1. The lungs are well-expanded  with the exception of small amount of left basilar atelectasis and tiny left pleural effusion. The left-sided chest tube is stable in appearance. 2. The cardiac silhouette is top-normal in size. The pulmonary vascularity is nearly normal. 3. Positioning of the tip of the esophagogastric tube cannot be assessed on this study. It may lie just above the thoracoabdominal junction. A high abdominal radiograph is recommended. 4. Stable radiodense tape like structure projecting over the left pulmonary apex which is been previously described is presumably external to the patient. Electronically Signed   By: David  Martinique M.D.   On: 07/27/2015 07:47     Assessment/Plan: S/P Procedure(s) (LRB): TRANSHIATAL TOTAL ESOPHAGECTOMY COMPLETE PYLOROMYOTOMY (N/A) FEEDING JEJUNOSTOMY (N/A) VIDEO BRONCHOSCOPY (N/A) Mobilize Diuresis Diabetes control Renal function stable Mild thombycytopenia, improving on lovenox 40 mg daily increase as risk of bleeding post op decreases Start slow tube feeding Expected Acute  Blood - loss Anemia  [name] 07/27/2015 8:25 AM

## 2015-07-27 NOTE — Progress Notes (Signed)
Initial Nutrition Assessment  DOCUMENTATION CODES:   Obesity unspecified  INTERVENTION:   Initiate Vital AF 1.2 formula via J-tube at 20 ml/hr.  Advancement per MD discretion.  Recommend goal rate of 80 ml/hr.  Goal rate TF regimen to provide 2304 kcals, 144 gm protein, 1605 ml of free water  NUTRITION DIAGNOSIS:   Inadequate oral intake related to inability to eat as evidenced by NPO status  GOAL:   Patient will meet greater than or equal to 90% of their needs  MONITOR:   TF tolerance, Labs, Weight trends, Skin, I & O's  REASON FOR ASSESSMENT:   Consult Enteral/tube feeding initiation and management  ASSESSMENT:   69 y.o. 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 The patient has a history of [name]'s esophagus dating back to biopsies in 2007. In the spring of this year he had begun having epigastric discomfort when eating and some mild trouble swallowing. A cardiac stress test was performed which was negative for ischemia endoscopy and ultimately endoscopic ultrasound were performed in late July, confirming adenocarcinoma the esophagus.  Patient s/p procedure 12/6: TRANSHIATAL TOTAL ESOPHAGECTOMY  COMPLETE PYLOROMYOTOMY  FEEDING JEJUNOSTOMY   RD briefly spoke with patient and patient's wife at bedside.  Pt reported consuming regular, solid food PTA.  No weight unintentional weight loss.  Pt states he's actually gained weight.  NGT in place to LIS.  Nutrition focused physical exam completed.  No muscle or subcutaneous fat depletion noticed.  Diet Order:  Diet NPO time specified  Skin:  Reviewed, no issues  Last BM:  12/5  Height:   Ht Readings from Last 1 Encounters:  07/25/15 6\' 4"  (1.93 m)    Weight:   Wt Readings from Last 1 Encounters:  07/26/15 258 lb 4.8 oz (117.164 kg)    Ideal Body Weight:  91.8 kg  BMI:  Body mass index is 31.45 kg/(m^2).  Estimated Nutritional Needs:    Kcal:  2200-2400  Protein:  135-145 gm  Fluid:  2.2-2.4 L  EDUCATION NEEDS:   No education needs identified at this time  Arthur Holms, RD, LDN Pager #: [phone] After-Hours Pager #: [phone]

## 2015-07-27 NOTE — Plan of Care (Signed)
Problem: Activity: Goal: Ability to tolerate increased activity will improve Outcome: Progressing Pt walked yesterday 115ft. Goal: Mobility will improve Outcome: Progressing Pt states that he "feels stronger today." Will attempt to walk x3 today.  Problem: Pain Management: Goal: Pain level will decrease Outcome: Progressing Pt pain better controled on PCA.

## 2015-07-28 ENCOUNTER — Inpatient Hospital Stay (HOSPITAL_COMMUNITY): Payer: Medicare Other

## 2015-07-28 ENCOUNTER — Ambulatory Visit: Payer: Medicare Other | Admitting: Endocrinology

## 2015-07-28 LAB — GLUCOSE, CAPILLARY
Glucose-Capillary: 147 mg/dL — ABNORMAL HIGH (ref 65–99)
Glucose-Capillary: 128 mg/dL — ABNORMAL HIGH (ref 65–99)
Glucose-Capillary: 126 mg/dL — ABNORMAL HIGH (ref 65–99)
Glucose-Capillary: 91 mg/dL (ref 65–99)
Glucose-Capillary: 116 mg/dL — ABNORMAL HIGH (ref 65–99)

## 2015-07-28 LAB — CBC
HCT: 26.5 % — ABNORMAL LOW (ref 39.0–52.0)
Hemoglobin: 8.5 g/dL — ABNORMAL LOW (ref 13.0–17.0)
MCH: 32.8 pg (ref 26.0–34.0)
MCHC: 32.1 g/dL (ref 30.0–36.0)
MCV: 102.3 fL — ABNORMAL HIGH (ref 78.0–100.0)
Platelets: 101 10*3/uL — ABNORMAL LOW (ref 150–400)
RBC: 2.59 MIL/uL — ABNORMAL LOW (ref 4.22–5.81)
RDW: 13.5 % (ref 11.5–15.5)
WBC: 7.4 10*3/uL (ref 4.0–10.5)

## 2015-07-28 LAB — BASIC METABOLIC PANEL
Anion gap: 4 — ABNORMAL LOW (ref 5–15)
BUN: 21 mg/dL — ABNORMAL HIGH (ref 6–20)
CO2: 26 mmol/L (ref 22–32)
Calcium: 8.3 mg/dL — ABNORMAL LOW (ref 8.9–10.3)
Chloride: 110 mmol/L (ref 101–111)
Creatinine, Ser: 1.22 mg/dL (ref 0.61–1.24)
GFR calc Af Amer: >60 mL/min (ref >60)
GFR calc non Af Amer: 60 mL/min — ABNORMAL LOW (ref >60)
Glucose, Bld: 162 mg/dL — ABNORMAL HIGH (ref 65–99)
Potassium: 4.1 mmol/L (ref 3.5–5.1)
Sodium: 140 mmol/L (ref 135–145)

## 2015-07-28 MED ORDER — VITAL AF 1.2 CAL PO LIQD
1000.0000 mL | ORAL | Status: DC
  Administered 2015-07-28: 1000 mL
  Filled 2015-07-28 (×2): qty 1000

## 2015-07-28 MED ORDER — FUROSEMIDE 10 MG/ML IJ SOLN
40.0000 mg | Freq: Once | INTRAMUSCULAR | Status: AC
  Administered 2015-07-28: 40 mg via INTRAVENOUS
  Filled 2015-07-28: qty 4

## 2015-07-28 MED ORDER — ENOXAPARIN SODIUM 40 MG/0.4ML ~~LOC~~ SOLN
40.0000 mg | Freq: Two times a day (BID) | SUBCUTANEOUS | Status: DC
  Administered 2015-07-28 – 2015-08-05 (×16): 40 mg via SUBCUTANEOUS
  Filled 2015-07-28 (×17): qty 0.4

## 2015-07-28 MED ORDER — LEVALBUTEROL HCL 0.63 MG/3ML IN NEBU
INHALATION_SOLUTION | RESPIRATORY_TRACT | Status: AC
  Administered 2015-07-28: 0.63 mg via RESPIRATORY_TRACT
  Filled 2015-07-28: qty 3

## 2015-07-28 MED ORDER — LEVALBUTEROL HCL 0.63 MG/3ML IN NEBU
0.6300 mg | INHALATION_SOLUTION | Freq: Four times a day (QID) | RESPIRATORY_TRACT | Status: DC
  Administered 2015-07-28 – 2015-08-02 (×21): 0.63 mg via RESPIRATORY_TRACT
  Filled 2015-07-28 (×22): qty 3

## 2015-07-28 NOTE — Plan of Care (Signed)
Problem: Activity: Goal: Ability to tolerate increased activity will improve Outcome: Progressing Pt slow to progress activity. Walked only 167ft yesterday.  Goal: Mobility will improve Outcome: Progressing Slow to progress. See previous note.  Problem: Bowel/Gastric: Goal: Gastrointestinal status for postoperative course will improve Outcome: Progressing Minimal bowel sounds

## 2015-07-28 NOTE — Progress Notes (Signed)
Patient ID: Ronald Johnson, male   DOB: [birth year], 68 y.o.   MRN: [id number] EVENING ROUNDS NOTE :     [address]       [place] [address]             [phone]                 3 Days Post-Op Procedure(s) (LRB): TRANSHIATAL TOTAL ESOPHAGECTOMY COMPLETE PYLOROMYOTOMY (N/A) FEEDING JEJUNOSTOMY (N/A) VIDEO BRONCHOSCOPY (N/A)  Total Length of Stay:  LOS: 3 days  BP 146/80 mmHg  Pulse 93  Temp(Src) 97.5 F (36.4 C) (Oral)  Resp 13  Ht 6\' 4"  (1.93 m)  Wt 258 lb 2.5 oz (117.1 kg)  BMI 31.44 kg/m2  SpO2 98%  .Intake/Output      12/08 0701 - 12/09 0700 12/09 0701 - 12/10 0700   I.V. (mL/kg) 1905.8 (16.3) 550 (4.7)   Other 180    NG/GT 421.3 230   IV Piggyback     Total Intake(mL/kg) 2507.2 (21.4) 780 (6.7)   Urine (mL/kg/hr) 1355 (0.5) 1620 (1.3)   Emesis/NG output 260 (0.1)    Drains 0 (0)    Chest Tube 30 (0) 60 (0)   Total Output 1645 1680   Net +862.2 -900          . dextrose 5 % and 0.9% NaCl 50 mL (07/28/15 1223)  . feeding supplement (VITAL AF 1.2 CAL) 1,000 mL (07/28/15 1145)     Lab Results  Component Value Date   WBC 7.4 07/28/2015   HGB 8.5* 07/28/2015   HCT 26.5* 07/28/2015   PLT 101* 07/28/2015   GLUCOSE 162* 07/28/2015   CHOL 170 12/02/2014   TRIG * 12/02/2014    449.0 Triglyceride is over 400; calculations on Lipids are invalid.   HDL 32.40* 12/02/2014   LDLDIRECT 93.0 12/02/2014   LDLCALC 94 06/17/2014   ALT 26 07/24/2015   AST 32 07/24/2015   NA 140 07/28/2015   K 4.1 07/28/2015   CL 110 07/28/2015   CREATININE 1.22 07/28/2015   BUN 21* 07/28/2015   CO2 26 07/28/2015   TSH 2.50 12/02/2014   PSA 0.85 12/02/2014   INR 1.08 07/24/2015   HGBA1C 9.2 06/05/2015   MICROALBUR 20.9* 12/02/2014   Some wheezing tonight, will give additional lasix and bronchdilator  [name] MD  Beeper [phone] Office [phone] 07/28/2015 5:24 PM

## 2015-07-28 NOTE — Progress Notes (Signed)
Patient ID: Ronald Johnson, male   DOB: August 17, 1946, 69 y.o.   MRN: [id number] TCTS DAILY ICU PROGRESS NOTE                   [address]            [place] [address]          [phone]   3 Days Post-Op Procedure(s) (LRB): TRANSHIATAL TOTAL ESOPHAGECTOMY COMPLETE PYLOROMYOTOMY (N/A) FEEDING JEJUNOSTOMY (N/A) VIDEO BRONCHOSCOPY (N/A)  Total Length of Stay:  LOS: 3 days   Subjective: Patient awake and alert , neuro intact, pain control and anxiety well controlled  Objective: Vital signs in last 24 hours: Temp:  [97.9 F (36.6 C)-98.9 F (37.2 C)] 98 F (36.7 C) (12/09 0803) Pulse Rate:  [25-107] 25 (12/09 1000) Cardiac Rhythm:  [-] Normal sinus rhythm (12/09 0800) Resp:  [11-23] 19 (12/09 1000) BP: (97-158)/(54-86) 158/69 mmHg (12/09 1000) SpO2:  [92 %-100 %] 92 % (12/09 1000) Weight:  [258 lb 2.5 oz (117.1 kg)] 258 lb 2.5 oz (117.1 kg) (12/09 0500)  Filed Weights   07/25/15 0550 07/26/15 0500 07/28/15 0500  Weight: 250 lb (113.399 kg) 258 lb 4.8 oz (117.164 kg) 258 lb 2.5 oz (117.1 kg)    Weight change:    Hemodynamic parameters for last 24 hours:    Intake/Output from previous day: 12/08 0701 - 12/09 0700 In: 2507.2 [I.V.:1905.8; NG/GT:421.3] Out: 1645 [Urine:1355; Emesis/NG output:260; Chest Tube:30]  Intake/Output this shift: Total I/O In: 285 [I.V.:225; NG/GT:60] Out: 20 [Chest Tube:20]  Current Meds: Scheduled Meds: . antiseptic oral rinse  7 mL Mouth Rinse q12n4p  . chlorhexidine  15 mL Mouth Rinse BID  . enoxaparin (LOVENOX) injection  40 mg Subcutaneous Q24H  . HYDROmorphone   Intravenous 6 times per day  . insulin aspart  0-24 Units Subcutaneous 6 times per day  . insulin detemir  50 Units Subcutaneous BID  . pantoprazole (PROTONIX) IV  40 mg Intravenous Q12H  . sodium chloride  10 mL Intravenous Q12H   Continuous Infusions: . dextrose 5 % and 0.9% NaCl 75 mL/hr at 07/28/15 0601  . feeding supplement (VITAL AF 1.2 CAL) 1,000  mL (07/28/15 0400)   PRN Meds:.ALPRAZolam, diphenhydrAMINE **OR** diphenhydrAMINE, naloxone **AND** sodium chloride, ondansetron (ZOFRAN) IV, potassium chloride  General appearance: alert, cooperative and no distress Neurologic: intact Heart: regular rate and rhythm, S1, S2 normal, no murmur, click, rub or gallop Lungs: diminished breath sounds bibasilar Abdomen: soft, non-tender; bowel sounds normal; no masses,  no organomegaly Extremities: extremities normal, atraumatic, no cyanosis or edema and Homans sign is negative, no sign of DVT Wound: wounds intact, no drainage from neck incision  Lab Results: CBC: Recent Labs  07/27/15 0342 07/28/15 0418  WBC 7.9 7.4  HGB 8.7* 8.5*  HCT 27.6* 26.5*  PLT 96* 101*   BMET:  Recent Labs  07/27/15 0342 07/28/15 0418  NA 138 140  K 4.6 4.1  CL 109 110  CO2 25 26  GLUCOSE 220* 162*  BUN 19 21*  CREATININE 1.32* 1.22  CALCIUM 8.1* 8.3*    PT/INR: No results for input(s): LABPROT, INR in the last 72 hours. Radiology: Dg Chest Port 1 View  07/28/2015  CLINICAL DATA:  Esophageal cancer EXAM: PORTABLE CHEST 1 VIEW COMPARISON:  Yesterday FINDINGS: Mild cardiomegaly. Left supraclavicular Penrose drain. Stable left chest tube, NG tube, and right jugular venous catheter. Left basilar consolidation is stable. Hazy opacity at the right base increased likely volume  loss. No pneumothorax. IMPRESSION: Stable left basilar consolidation. Increased volume loss at the right base. Electronically Signed   By: Marybelle Killings M.D.   On: 07/28/2015 08:01     Assessment/Plan: S/P Procedure(s) (LRB): TRANSHIATAL TOTAL ESOPHAGECTOMY COMPLETE PYLOROMYOTOMY (N/A) FEEDING JEJUNOSTOMY (N/A) VIDEO BRONCHOSCOPY (N/A) Mobilize Diuresis Diabetes control Continue foley due to acute urinary retention  Increased to bid lovenox due to question of pe left lower lobe Increase tube feeding Had to replace foley due to urinary retention Renal function stable Final path  noted and discussed with the patient  GE junction carcinoma ([place])   Staging form: Stomach, AJCC 7th Edition     Clinical: Stage IIB (T3, N1, M0) - Signed by Ladell Pier, MD on 03/28/2015   Staging form: Esophagus - Adenocarcinoma, AJCC 7th Edition     Pathologic stage from 07/26/2015: Stage IIIB (yT3, N2, cM0, G3 - Poorly differentiated) - Signed by [name], MD on 07/28/2015    [name] 07/28/2015 11:25 AM

## 2015-07-28 NOTE — Progress Notes (Signed)
Foley catheter placed per protocol for post foley removal residual of >450.  Discussed with Dr. Servando Snare, who requested the foley be replaced.

## 2015-07-28 NOTE — Care Management Important Message (Signed)
Important Message  Patient Details  Name: Ronald Johnson MRN: [id number] Date of Birth: 03/21/1946   Medicare Important Message Given:  Yes    Cearra Portnoy P Accalia Rigdon 07/28/2015, 1:33 PM

## 2015-07-29 ENCOUNTER — Inpatient Hospital Stay (HOSPITAL_COMMUNITY): Payer: Medicare Other

## 2015-07-29 LAB — GLUCOSE, CAPILLARY
Glucose-Capillary: 119 mg/dL — ABNORMAL HIGH (ref 65–99)
Glucose-Capillary: 127 mg/dL — ABNORMAL HIGH (ref 65–99)
Glucose-Capillary: 128 mg/dL — ABNORMAL HIGH (ref 65–99)
Glucose-Capillary: 140 mg/dL — ABNORMAL HIGH (ref 65–99)
Glucose-Capillary: 82 mg/dL (ref 65–99)
Glucose-Capillary: 94 mg/dL (ref 65–99)

## 2015-07-29 LAB — COMPREHENSIVE METABOLIC PANEL
ALT: 23 U/L (ref 17–63)
AST: 19 U/L (ref 15–41)
Albumin: 2.7 g/dL — ABNORMAL LOW (ref 3.5–5.0)
Alkaline Phosphatase: 70 U/L (ref 38–126)
Anion gap: 8 (ref 5–15)
BUN: 27 mg/dL — ABNORMAL HIGH (ref 6–20)
CO2: 27 mmol/L (ref 22–32)
Calcium: 8.7 mg/dL — ABNORMAL LOW (ref 8.9–10.3)
Chloride: 110 mmol/L (ref 101–111)
Creatinine, Ser: 1.28 mg/dL — ABNORMAL HIGH (ref 0.61–1.24)
GFR calc Af Amer: >60 mL/min (ref >60)
GFR calc non Af Amer: 56 mL/min — ABNORMAL LOW (ref >60)
Glucose, Bld: 140 mg/dL — ABNORMAL HIGH (ref 65–99)
Potassium: 3.7 mmol/L (ref 3.5–5.1)
Sodium: 145 mmol/L (ref 135–145)
Total Bilirubin: 0.6 mg/dL (ref 0.3–1.2)
Total Protein: 5.3 g/dL — ABNORMAL LOW (ref 6.5–8.1)

## 2015-07-29 LAB — CBC
HCT: 25.6 % — ABNORMAL LOW (ref 39.0–52.0)
Hemoglobin: 8.4 g/dL — ABNORMAL LOW (ref 13.0–17.0)
MCH: 33.2 pg (ref 26.0–34.0)
MCHC: 32.8 g/dL (ref 30.0–36.0)
MCV: 101.2 fL — ABNORMAL HIGH (ref 78.0–100.0)
Platelets: 105 10*3/uL — ABNORMAL LOW (ref 150–400)
RBC: 2.53 MIL/uL — ABNORMAL LOW (ref 4.22–5.81)
RDW: 13.3 % (ref 11.5–15.5)
WBC: 6.9 10*3/uL (ref 4.0–10.5)

## 2015-07-29 MED ORDER — INFLUENZA VAC SPLIT QUAD 0.5 ML IM SUSY
0.5000 mL | PREFILLED_SYRINGE | INTRAMUSCULAR | Status: AC
  Administered 2015-08-05: 0.5 mL via INTRAMUSCULAR
  Filled 2015-07-29 (×3): qty 0.5

## 2015-07-29 MED ORDER — PNEUMOCOCCAL VAC POLYVALENT 25 MCG/0.5ML IJ INJ
0.5000 mL | INJECTION | INTRAMUSCULAR | Status: AC
  Administered 2015-08-05: 0.5 mL via INTRAMUSCULAR
  Filled 2015-07-29 (×3): qty 0.5

## 2015-07-29 MED ORDER — POTASSIUM CHLORIDE 10 MEQ/50ML IV SOLN
10.0000 meq | Freq: Once | INTRAVENOUS | Status: AC
  Administered 2015-07-29: 10 meq via INTRAVENOUS
  Filled 2015-07-29: qty 50

## 2015-07-29 MED ORDER — VITAL AF 1.2 CAL PO LIQD
1000.0000 mL | ORAL | Status: DC
  Administered 2015-07-29: 1000 mL
  Filled 2015-07-29 (×3): qty 1000

## 2015-07-29 MED ORDER — FUROSEMIDE 10 MG/ML IJ SOLN
40.0000 mg | Freq: Once | INTRAMUSCULAR | Status: AC
  Administered 2015-07-29: 40 mg via INTRAVENOUS
  Filled 2015-07-29: qty 4

## 2015-07-29 NOTE — Progress Notes (Signed)
Patient ID: Ronald Johnson, male   DOB: 08-03-1946, 69 y.o.   MRN: [id number] EVENING ROUNDS NOTE :     [address]       [place] [address]             [phone]                 4 Days Post-Op Procedure(s) (LRB): TRANSHIATAL TOTAL ESOPHAGECTOMY COMPLETE PYLOROMYOTOMY (N/A) FEEDING JEJUNOSTOMY (N/A) VIDEO BRONCHOSCOPY (N/A)  Total Length of Stay:  LOS: 4 days  BP 124/69 mmHg  Pulse 102  Temp(Src) 97.7 F (36.5 C) (Oral)  Resp 13  Ht 6\' 4"  (1.93 m)  Wt 250 lb 7.1 oz (113.6 kg)  BMI 30.50 kg/m2  SpO2 96%  .Intake/Output      12/09 0701 - 12/10 0700 12/10 0701 - 12/11 0700   I.V. (mL/kg) 1300 (11.1) 550 (4.8)   Other 30 90   NG/GT 710 494.3   IV Piggyback 50 100   Total Intake(mL/kg) 2090 (17.8) 1234.3 (10.9)   Urine (mL/kg/hr) 2745 (1) 2030 (1.6)   Emesis/NG output 390 (0.1) 300 (0.2)   Drains  0 (0)   Chest Tube 190 (0.1) 30 (0)   Total Output 3325 2360   Net -1235 -1125.7          . dextrose 5 % and 0.9% NaCl 50 mL/hr at 07/29/15 1800  . feeding supplement (VITAL AF 1.2 CAL) 1,000 mL (07/29/15 1800)     Lab Results  Component Value Date   WBC 6.9 07/29/2015   HGB 8.4* 07/29/2015   HCT 25.6* 07/29/2015   PLT 105* 07/29/2015   GLUCOSE 140* 07/29/2015   CHOL 170 12/02/2014   TRIG * 12/02/2014    449.0 Triglyceride is over 400; calculations on Lipids are invalid.   HDL 32.40* 12/02/2014   LDLDIRECT 93.0 12/02/2014   LDLCALC 94 06/17/2014   ALT 23 07/29/2015   AST 19 07/29/2015   NA 145 07/29/2015   K 3.7 07/29/2015   CL 110 07/29/2015   CREATININE 1.28* 07/29/2015   BUN 27* 07/29/2015   CO2 27 07/29/2015   TSH 2.50 12/02/2014   PSA 0.85 12/02/2014   INR 1.08 07/24/2015   HGBA1C 9.2 06/05/2015   MICROALBUR 20.9* 12/02/2014   Improved today, respiratory effort better tonight then last night Good uop today follow up labs in am  [name] MD  [place] Office [phone] 07/29/2015 6:26 PM

## 2015-07-29 NOTE — Plan of Care (Signed)
Problem: Activity: Goal: Ability to tolerate increased activity will improve Outcome: Progressing Ambulated 300 ft 3 times today

## 2015-07-29 NOTE — Progress Notes (Signed)
07/29/2015 7:03 PM Dr. Servando Snare paged and made aware after removal of Silver Hydrofiber Dsg small area at distal end of incision approximately 1/4 inch unapproximated. Incision covered with dry gauze. No new orders.  Saoirse Legere, Arville Lime

## 2015-07-29 NOTE — Op Note (Signed)
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  SURGICAL PROCEDURE:  Bronchoscopy, transhiatal total esophagectomy with cervical esophagogastrostomy, pyloromyotomy, and placement of jejunal feeding tube.  SURGEON:  Lanelle Bal, MD.  FIRST ASSISTANT:  John Giovanni, PA-C.  BRIEF HISTORY:  The patient is a 69 year old male, who presented with swallowing difficulties.  Preoperative evaluation including CT scan, PET scan, upper GI endoscopy, and esophageal ultrasound all were consistent with at least stage IIB carcinoma of the GE junction extending approximately 4 cm up the esophagus and area of [name] esophagus.  The patient had no evidence of distant metastasis.  He underwent a course of preoperative chemotherapy and radiation and then presented for consideration for surgical resection.  A  preoperative CT scan showed no evidence of distant metastasis.  There was a question of incidental pulmonary embolus in the subsegment of the left lower lobe.  The venous Dopplers showed no evidence of lower extremity DVT.  This area was reviewed with several radiologists and was difficult to conclude definitively.  The risks and options of surgical resection were discussed with the patient and his wife in detail including the possibility of recurrent nerve damage, esophageal leak, or cardiac complications. The patient agreed and signed informed consent.  DESCRIPTION OF PROCEDURE:  With central line and arterial line in place, the  patient underwent general endotracheal anesthesia.  Appropriate time- out was performed.  We then proceeded to perform a video bronchoscopy through a single-lumen endotracheal tube at subsegmental level of  both the right and left tracheobronchial tree without evidence of endobronchial lesions or tracheal involvement.  A NIMS tube had been placed.  The left neck, chest, and abdomen were then prepped with Betadine and draped in sterile manner.  A second time-out was performed, then we proceeded with an upper midline abdominal incision and explored the tip down the falciform ligament, explored the abdomen at the hiatus, esophageal hiatus mass could be felt, it was not obviously invading into the diaphragm or surrounding structures.  We then took down the short gastric vessels taking care not to injure the stomach with the LINX device.  This dissection was then carried across the esophageal hiatus and the esophagus was dissected circumferentially and a Penrose drain was placed around the esophagus.  We then entered the lesser sac preserving the right gastroepiploic as the supplier of the stomach.  The cardinal vein in left gastric artery was identified and encircled with vessel loops.  With bulldog on the gastric artery, there was an easily palpable pulse in the gastroepiploic.  We then proceeded with a transhiatal dissection of the esophagus.  A significant portion of this was done with direct vision through the hiatus with the small branches divided with the Harmonic scalpel with significant  portion of the intrathoracic portion of the esophagus.  We then turned our attention to the neck.  An incision was made along the anterior sternocleidomastoid muscle on the left side of the neck.  Dissection was then carried down dividing the strap muscles.  The carotid artery and jugular vein were retracted laterally and dissection carried down to the prevertebral fascia.  The NIMS stimulator was used  to identify the location of the recurrent nerve which was preserved throughout the dissection.  We then encircled the cervical esophagus as it entered the thoracic cavity with a Penrose drain.  Working from above and below the remainder of the esophagus was freed.  This NG tube that was in the esophagus was pulled up into the cervical esophagus and esophagus at the cervical level was divided with a GIA stapler.  The specimen was then delivered into the abdomen.  The palpable mass was identified at the GE junction.  The left gastric artery and vein were then divided with a vascular stapler. Using serial firings at least 4 times of GIA stapler, a gastric tube was created based on the right gastroepiploic artery and leaving at least 2.5 to 3 cm margin from the palpable gross tumor at the GE junction. The specimen was submitted to Pathology for examination.  The frozen section along the lesser curve of the stomach in the area where the left gastric artery was divided, there was area of thickening and this was dissected free and submitted separately to pathology to look for lymph nodes.  We then proceeded with pyloromyotomy with the needle cautery and then closed the serosa with interrupted silk sutures.  The pathologist returned  frozen section results with negative proximal and distal margins. Using a telescope bag and a Foley catheter, we were able to guide the gastric tube through the posterior mediastinum into the cervical neck incision taking care not to twist and to keep the alignment of the specimen appropriate.  The neck of the fundus of the stomach was then tacked to the cervical esophagus.  The stapled end of the cervical esophagus was divided and a small opening was made in the fundus of the stomach.  An Endo stapler was then used to create  posterior wall of the anastomosis by placing one limb of the stapler in the esophagus and one in the stomach.  Prior to doing this, the  mucosal edges were approximated and the stapler used and created the posterior wall.  The NG tube was then placed across the anastomosis into the upper abdomen, and the anterior portion of the anastomosis was closed with interrupted Vicryl sutures and a second layer of 3-0 silk sutures.  A Penrose drain was left in the vicinity of the anastomosis and brought out through a separate site in the neck.  At the completion of this, the NIMS unit confirmed the recurrent nerve was intact.  We then returned to the abdomen.  The esophageal hiatus was closed to prevent any herniation into the chest and tacked to the stomach.  The proximal jejunum location was confirmed, and an 18-French red rubber catheter with additional holes in the tip removed was placed into the jejunum as a jejunal feeding tube and still in place and brought out through a separate site on the left side of the abdominal wall and tacked to the abdominal wall to prevent any torsion.  With the anastomosis completed in the neck at the site of anastomosis, the mucosa appeared intact and viable.  The incision  was then closed in the neck with interrupted 3-0 Vicryls in the muscle layer and a 3-0 subcuticular in skin edges.  The abdominal cavity was then closed with a #2 Prolene suture, running 2-0 Vicryl in the subcutaneous tissue, and a 3-0 subcuticular stitch in the skin edges. Dry dressings were applied.  A left chest tube was left in place to ensure there was no drainage into the left chest during the dissection. Estimated blood loss was 400-500 mL.  Sponge and needle count was reported as correct at the completion of procedure.  The patient tolerated the procedure without obvious complication and was transferred to the Surgical Intensive Care Unit for further postoperative care.     Lanelle Bal, MD     EG/MEDQ  D:  07/28/2015  T:  07/29/2015  Job:  [id number]

## 2015-07-29 NOTE — Progress Notes (Signed)
Patient ID: Ronald Johnson, male   DOB: [birth year], 69 y.o.   MRN: [id number] TCTS DAILY ICU PROGRESS NOTE                   [address]            [place] [address]          [phone]   4 Days Post-Op Procedure(s) (LRB): TRANSHIATAL TOTAL ESOPHAGECTOMY COMPLETE PYLOROMYOTOMY (N/A) FEEDING JEJUNOSTOMY (N/A) VIDEO BRONCHOSCOPY (N/A)  Total Length of Stay:  LOS: 4 days   Subjective: Rested during night well, up to chair, breathing earlier then last night  Objective: Vital signs in last 24 hours: Temp:  [97.5 F (36.4 C)-98.7 F (37.1 C)] 98.3 F (36.8 C) (12/10 0758) Pulse Rate:  [25-124] 95 (12/10 0800) Cardiac Rhythm:  [-] Normal sinus rhythm (12/10 0800) Resp:  [9-22] 19 (12/10 0800) BP: (90-158)/(58-91) 144/75 mmHg (12/10 0800) SpO2:  [92 %-100 %] 95 % (12/10 0818) Weight:  [250 lb 7.1 oz (113.6 kg)] 250 lb 7.1 oz (113.6 kg) (12/10 0800)  Filed Weights   07/26/15 0500 07/28/15 0500 07/29/15 0800  Weight: 258 lb 4.8 oz (117.164 kg) 258 lb 2.5 oz (117.1 kg) 250 lb 7.1 oz (113.6 kg)    Weight change:    Hemodynamic parameters for last 24 hours:    Intake/Output from previous day: 12/09 0701 - 12/10 0700 In: 2090 [I.V.:1300; NG/GT:710; IV Piggyback:50] Out: 3325 [Urine:2745; Emesis/NG output:390; Chest Tube:190]  Intake/Output this shift: Total I/O In: 180 [I.V.:50; NG/GT:30; IV Piggyback:100] Out: 245 [Urine:125; Emesis/NG output:100; Chest Tube:20]  Current Meds: Scheduled Meds: . antiseptic oral rinse  7 mL Mouth Rinse q12n4p  . chlorhexidine  15 mL Mouth Rinse BID  . enoxaparin (LOVENOX) injection  40 mg Subcutaneous Q12H  . HYDROmorphone   Intravenous 6 times per day  . insulin aspart  0-24 Units Subcutaneous 6 times per day  . insulin detemir  50 Units Subcutaneous BID  . levalbuterol  0.63 mg Nebulization Q6H  . pantoprazole (PROTONIX) IV  40 mg Intravenous Q12H  . sodium chloride  10 mL Intravenous Q12H   Continuous  Infusions: . dextrose 5 % and 0.9% NaCl 50 mL/hr at 07/29/15 0800  . feeding supplement (VITAL AF 1.2 CAL) 1,000 mL (07/29/15 0800)   PRN Meds:.ALPRAZolam, diphenhydrAMINE **OR** diphenhydrAMINE, naloxone **AND** sodium chloride, ondansetron (ZOFRAN) IV, potassium chloride  General appearance: alert, cooperative and no distress Neurologic: intact Heart: regular rate and rhythm, S1, S2 normal, no murmur, click, rub or gallop Lungs: diminished breath sounds bibasilar Abdomen: not disyended, hypoactive bowel sounds Extremities: extremities normal, atraumatic, no cyanosis or edema and Homans sign is negative, no sign of DVT Wound: no drainage from neck drain  Lab Results: CBC: Recent Labs  07/28/15 0418 07/29/15 0336  WBC 7.4 6.9  HGB 8.5* 8.4*  HCT 26.5* 25.6*  PLT 101* 105*   BMET:  Recent Labs  07/28/15 0418 07/29/15 0336  NA 140 145  K 4.1 3.7  CL 110 110  CO2 26 27  GLUCOSE 162* 140*  BUN 21* 27*  CREATININE 1.22 1.28*  CALCIUM 8.3* 8.7*    PT/INR: No results for input(s): LABPROT, INR in the last 72 hours. Radiology: Dg Chest Port 1 View  07/29/2015  CLINICAL DATA:  Left-sided chest tube EXAM: PORTABLE CHEST 1 VIEW COMPARISON:  07/28/2015 FINDINGS: Surgical drain in the LEFT neck. NG tube and LEFT central venous line are unchanged. Chest tube at the LEFT lung  base. Small LEFT apical pneumothorax with pleural edge 3 mm from chest wall. IMPRESSION: Stable support apparatus. LEFT chest tube in with trace apical pneumothorax. Electronically Signed   By: Suzy Bouchard M.D.   On: 07/29/2015 07:23     Assessment/Plan: S/P Procedure(s) (LRB): TRANSHIATAL TOTAL ESOPHAGECTOMY COMPLETE PYLOROMYOTOMY (N/A) FEEDING JEJUNOSTOMY (N/A) VIDEO BRONCHOSCOPY (N/A) Mobilize Diuresis Diabetes control Continue foley due to acute urinary retention Increase tube feeding On Lovenox q 12 but less then full anticoagulation dose due to postop risk of bleeding H/h stable renal  function stable Leave chest tube for now   Ronald Johnson 07/29/2015 9:27 AM

## 2015-07-30 ENCOUNTER — Inpatient Hospital Stay (HOSPITAL_COMMUNITY): Payer: Medicare Other

## 2015-07-30 LAB — CBC
HCT: 25.4 % — ABNORMAL LOW (ref 39.0–52.0)
Hemoglobin: 8.2 g/dL — ABNORMAL LOW (ref 13.0–17.0)
MCH: 32.8 pg (ref 26.0–34.0)
MCHC: 32.3 g/dL (ref 30.0–36.0)
MCV: 101.6 fL — ABNORMAL HIGH (ref 78.0–100.0)
Platelets: 110 10*3/uL — ABNORMAL LOW (ref 150–400)
RBC: 2.5 MIL/uL — ABNORMAL LOW (ref 4.22–5.81)
RDW: 13.2 % (ref 11.5–15.5)
WBC: 5.5 10*3/uL (ref 4.0–10.5)

## 2015-07-30 LAB — BASIC METABOLIC PANEL
Anion gap: 6 (ref 5–15)
BUN: 31 mg/dL — ABNORMAL HIGH (ref 6–20)
CO2: 29 mmol/L (ref 22–32)
Calcium: 8.8 mg/dL — ABNORMAL LOW (ref 8.9–10.3)
Chloride: 110 mmol/L (ref 101–111)
Creatinine, Ser: 1.16 mg/dL (ref 0.61–1.24)
GFR calc Af Amer: >60 mL/min (ref >60)
GFR calc non Af Amer: >60 mL/min (ref >60)
Glucose, Bld: 136 mg/dL — ABNORMAL HIGH (ref 65–99)
Potassium: 3.5 mmol/L (ref 3.5–5.1)
Sodium: 145 mmol/L (ref 135–145)

## 2015-07-30 LAB — GLUCOSE, CAPILLARY
Glucose-Capillary: 91 mg/dL (ref 65–99)
Glucose-Capillary: 126 mg/dL — ABNORMAL HIGH (ref 65–99)
Glucose-Capillary: 110 mg/dL — ABNORMAL HIGH (ref 65–99)
Glucose-Capillary: 104 mg/dL — ABNORMAL HIGH (ref 65–99)
Glucose-Capillary: 122 mg/dL — ABNORMAL HIGH (ref 65–99)
Glucose-Capillary: 100 mg/dL — ABNORMAL HIGH (ref 65–99)

## 2015-07-30 MED ORDER — PANTOPRAZOLE SODIUM 40 MG PO PACK
40.0000 mg | PACK | Freq: Every day | ORAL | Status: DC
  Administered 2015-07-30 – 2015-08-06 (×8): 40 mg via JEJUNOSTOMY
  Filled 2015-07-30 (×8): qty 20

## 2015-07-30 MED ORDER — POTASSIUM CHLORIDE 20 MEQ/15ML (10%) PO SOLN
20.0000 meq | Freq: Two times a day (BID) | ORAL | Status: AC
  Administered 2015-07-30 (×2): 20 meq
  Filled 2015-07-30 (×2): qty 15

## 2015-07-30 MED ORDER — VITAL AF 1.2 CAL PO LIQD
1000.0000 mL | ORAL | Status: DC
  Filled 2015-07-30 (×3): qty 1000

## 2015-07-30 NOTE — Progress Notes (Signed)
Patient ID: Ronald Johnson, male   DOB: March 30, 1946, 69 y.o.   MRN: [id number] TCTS DAILY ICU PROGRESS NOTE                   [address]            [place] [address]          [phone]   5 Days Post-Op Procedure(s) (LRB): TRANSHIATAL TOTAL ESOPHAGECTOMY COMPLETE PYLOROMYOTOMY (N/A) FEEDING JEJUNOSTOMY (N/A) VIDEO BRONCHOSCOPY (N/A)  Total Length of Stay:  LOS: 5 days   Subjective: Feels more comfortable, central line came out last night  Objective: Vital signs in last 24 hours: Temp:  [97.5 F (36.4 C)-98 F (36.7 C)] 98 F (36.7 C) (12/11 0400) Pulse Rate:  [79-108] 90 (12/11 0700) Cardiac Rhythm:  [-] Normal sinus rhythm (12/11 0400) Resp:  [10-23] 23 (12/11 0800) BP: (96-146)/(57-85) 133/70 mmHg (12/11 0700) SpO2:  [91 %-100 %] 96 % (12/11 0800) FiO2 (%):  [30 %] 30 % (12/10 1140) Weight:  [242 lb 4.6 oz (109.9 kg)] 242 lb 4.6 oz (109.9 kg) (12/11 0715)  Filed Weights   07/28/15 0500 07/29/15 0800 07/30/15 0715  Weight: 258 lb 2.5 oz (117.1 kg) 250 lb 7.1 oz (113.6 kg) 242 lb 4.6 oz (109.9 kg)    Weight change:    Hemodynamic parameters for last 24 hours:    Intake/Output from previous day: 12/10 0701 - 12/11 0700 In: 2554.3 [I.V.:1200; NG/GT:1104.3; IV Piggyback:100] Out: 3260 [Urine:2630; Emesis/NG output:600; Chest Tube:30]  Intake/Output this shift: Total I/O In: 120 [I.V.:50; NG/GT:70] Out: 380 [Urine:230; Emesis/NG output:150]  Current Meds: Scheduled Meds: . antiseptic oral rinse  7 mL Mouth Rinse q12n4p  . chlorhexidine  15 mL Mouth Rinse BID  . enoxaparin (LOVENOX) injection  40 mg Subcutaneous Q12H  . HYDROmorphone   Intravenous 6 times per day  . Influenza vac split quadrivalent PF  0.5 mL Intramuscular Tomorrow-1000  . insulin aspart  0-24 Units Subcutaneous 6 times per day  . insulin detemir  50 Units Subcutaneous BID  . levalbuterol  0.63 mg Nebulization Q6H  . pantoprazole (PROTONIX) IV  40 mg Intravenous Q12H  .  pneumococcal 23 valent vaccine  0.5 mL Intramuscular Tomorrow-1000  . sodium chloride  10 mL Intravenous Q12H   Continuous Infusions: . dextrose 5 % and 0.9% NaCl 50 mL/hr at 07/30/15 0800  . feeding supplement (VITAL AF 1.2 CAL) 1,000 mL (07/30/15 0800)   PRN Meds:.ALPRAZolam, diphenhydrAMINE **OR** diphenhydrAMINE, naloxone **AND** sodium chloride, ondansetron (ZOFRAN) IV, potassium chloride  General appearance: alert, cooperative and no distress Neurologic: intact Heart: regular rate and rhythm, S1, S2 normal, no murmur, click, rub or gallop Lungs: diminished breath sounds bibasilar Abdomen: hypoactive bowel sounds Extremities: extremities normal, atraumatic, no cyanosis or edema and Homans sign is negative, no sign of DVT Wound: very minimial drainage from neck drain, abdominal incision intact  Lab Results: CBC: Recent Labs  07/29/15 0336 07/30/15 0427  WBC 6.9 5.5  HGB 8.4* 8.2*  HCT 25.6* 25.4*  PLT 105* 110*   BMET:  Recent Labs  07/29/15 0336 07/30/15 0427  NA 145 145  K 3.7 3.5  CL 110 110  CO2 27 29  GLUCOSE 140* 136*  BUN 27* 31*  CREATININE 1.28* 1.16  CALCIUM 8.7* 8.8*    PT/INR: No results for input(s): LABPROT, INR in the last 72 hours. Radiology: Dg Chest Port 1 View  07/30/2015  CLINICAL DATA:  Left-sided chest tube.  Esophageal carcinoma  EXAM: PORTABLE CHEST 1 VIEW COMPARISON:  July 29, 2015 FINDINGS: Left chest tube position is unchanged. No pneumothorax. Nasogastric tube with tip and side port are below the diaphragm. Central catheter has been removed. There is mild atelectasis in the left base. The lungs elsewhere clear. Heart is borderline enlarged with pulmonary vascular within normal limits. Mild prominence in the superior mediastinum is stable in part may be due to portable cough positioning/technique. IMPRESSION: No change in left chest tube position. No pneumothorax. Mild left base atelectasis. Lungs elsewhere clear. No change in  cardiomediastinal silhouette. Right central catheter no longer present. Electronically Signed   By: Lowella Grip III M.D.   On: 07/30/2015 07:28     Assessment/Plan: S/P Procedure(s) (LRB): TRANSHIATAL TOTAL ESOPHAGECTOMY COMPLETE PYLOROMYOTOMY (N/A) FEEDING JEJUNOSTOMY (N/A) VIDEO BRONCHOSCOPY (N/A) Mobilize Diabetes control D/c foley again today Replace k  Increase tube feeding    [name] 07/30/2015 8:06 AM

## 2015-07-30 NOTE — Progress Notes (Signed)
Attempting to draw blood from RIJ and noticed clear fluid leaking from site. R IJ had become dislodged and pulled out almost completely, remove RIJ per protocol, cath tip intact, drsg applied. Attempted PIV insertion without success, order placed for IV team Consult.

## 2015-07-30 NOTE — Progress Notes (Signed)
Patient ID: Ronald Johnson, male   DOB: 08/31/1945, 69 y.o.   MRN: [id number] EVENING ROUNDS NOTE :     [address]       [place] [address]             [phone]                 5 Days Post-Op Procedure(s) (LRB): TRANSHIATAL TOTAL ESOPHAGECTOMY COMPLETE PYLOROMYOTOMY (N/A) FEEDING JEJUNOSTOMY (N/A) VIDEO BRONCHOSCOPY (N/A)  Total Length of Stay:  LOS: 5 days  BP 154/72 mmHg  Pulse 92  Temp(Src) 99 F (37.2 C) (Axillary)  Resp 13  Ht 6\' 4"  (1.93 m)  Wt 242 lb 4.6 oz (109.9 kg)  BMI 29.50 kg/m2  SpO2 98%  .Intake/Output      12/11 0701 - 12/12 0700   I.V. (mL/kg) 350 (3.2)   Other 180   NG/GT 700   IV Piggyback    Total Intake(mL/kg) 1230 (11.2)   Urine (mL/kg/hr) 405 (0.3)   Emesis/NG output 250 (0.2)   Drains 0 (0)   Chest Tube 30 (0)   Total Output 685   Net +545       Urine Occurrence 1 x     . dextrose 5 % and 0.9% NaCl 25 mL/hr at 07/30/15 1900  . feeding supplement (VITAL AF 1.2 CAL) 1,000 mL (07/30/15 1900)     Lab Results  Component Value Date   WBC 5.5 07/30/2015   HGB 8.2* 07/30/2015   HCT 25.4* 07/30/2015   PLT 110* 07/30/2015   GLUCOSE 136* 07/30/2015   CHOL 170 12/02/2014   TRIG * 12/02/2014    449.0 Triglyceride is over 400; calculations on Lipids are invalid.   HDL 32.40* 12/02/2014   LDLDIRECT 93.0 12/02/2014   LDLCALC 94 06/17/2014   ALT 23 07/29/2015   AST 19 07/29/2015   NA 145 07/30/2015   K 3.5 07/30/2015   CL 110 07/30/2015   CREATININE 1.16 07/30/2015   BUN 31* 07/30/2015   CO2 29 07/30/2015   TSH 2.50 12/02/2014   PSA 0.85 12/02/2014   INR 1.08 07/24/2015   HGBA1C 9.2 06/05/2015   MICROALBUR 20.9* 12/02/2014   Stable, no iv access now , will have pic line placed Voiding, but unable to measure, will bladder scan and make sure bladder emptying   [name] MD  Beeper [phone] Office [phone] 07/30/2015 7:11 PM

## 2015-07-31 ENCOUNTER — Inpatient Hospital Stay (HOSPITAL_COMMUNITY): Payer: Medicare Other

## 2015-07-31 LAB — GLUCOSE, CAPILLARY
Glucose-Capillary: 100 mg/dL — ABNORMAL HIGH (ref 65–99)
Glucose-Capillary: 107 mg/dL — ABNORMAL HIGH (ref 65–99)
Glucose-Capillary: 65 mg/dL (ref 65–99)
Glucose-Capillary: 75 mg/dL (ref 65–99)
Glucose-Capillary: 86 mg/dL (ref 65–99)
Glucose-Capillary: 91 mg/dL (ref 65–99)

## 2015-07-31 LAB — CBC
HCT: 27.1 % — ABNORMAL LOW (ref 39.0–52.0)
Hemoglobin: 8.4 g/dL — ABNORMAL LOW (ref 13.0–17.0)
MCH: 32.1 pg (ref 26.0–34.0)
MCHC: 31 g/dL (ref 30.0–36.0)
MCV: 103.4 fL — ABNORMAL HIGH (ref 78.0–100.0)
Platelets: 115 10*3/uL — ABNORMAL LOW (ref 150–400)
RBC: 2.62 MIL/uL — ABNORMAL LOW (ref 4.22–5.81)
RDW: 13.2 % (ref 11.5–15.5)
WBC: 6 10*3/uL (ref 4.0–10.5)

## 2015-07-31 LAB — TYPE AND SCREEN
ABO/RH(D): A POS
Antibody Screen: NEGATIVE
Unit division: 0
Unit division: 0

## 2015-07-31 LAB — BASIC METABOLIC PANEL
Anion gap: 9 (ref 5–15)
BUN: 31 mg/dL — ABNORMAL HIGH (ref 6–20)
CO2: 27 mmol/L (ref 22–32)
Calcium: 8.9 mg/dL (ref 8.9–10.3)
Chloride: 110 mmol/L (ref 101–111)
Creatinine, Ser: 1.06 mg/dL (ref 0.61–1.24)
GFR calc Af Amer: >60 mL/min (ref >60)
GFR calc non Af Amer: >60 mL/min (ref >60)
Glucose, Bld: 123 mg/dL — ABNORMAL HIGH (ref 65–99)
Potassium: 3.7 mmol/L (ref 3.5–5.1)
Sodium: 146 mmol/L — ABNORMAL HIGH (ref 135–145)

## 2015-07-31 MED ORDER — INSULIN DETEMIR 100 UNIT/ML ~~LOC~~ SOLN
40.0000 [IU] | Freq: Two times a day (BID) | SUBCUTANEOUS | Status: DC
Start: 2015-07-31 — End: 2015-08-01
  Administered 2015-07-31 – 2015-08-01 (×3): 40 [IU] via SUBCUTANEOUS
  Filled 2015-07-31 (×4): qty 0.4

## 2015-07-31 MED ORDER — VITAL AF 1.2 CAL PO LIQD
1000.0000 mL | ORAL | Status: DC
  Administered 2015-07-31 – 2015-08-02 (×2): 1000 mL
  Filled 2015-07-31 (×5): qty 1000

## 2015-07-31 NOTE — Clinical Documentation Improvement (Addendum)
Cardiothoracic Surgery  (Please document query responses in the progress notes and discharge summary.  Responses documented on the CDI BPA are not codeable because the BPA is not part of the permanent medical record.)  Please document a condition and/or indication for the IV Lasix 40 mg given 07/28/15 and 07/29/15.  Clinical Information: "Some wheezing tonight, will give additional lasix and bronchodilator" documented by Dr. Servando Snare 07/28/15 at 5:24 pm  "Diuresis" documented by Dr. Servando Snare 07/29/15 at 9:27 am EF 51% by preop nuclear stress test dated 01/19/2015 per Anesthesia PA note dated 07/24/15.   Please exercise your independent, professional judgment when responding. A specific answer is not anticipated or expected.   Thank You,  Erling Conte  RN BSN CCDS [phone] Health Information Management [place]

## 2015-07-31 NOTE — Progress Notes (Signed)
Peripherally Inserted Central Catheter/Midline Placement  The IV Nurse has discussed with the patient and/or persons authorized to consent for the patient, the purpose of this procedure and the potential benefits and risks involved with this procedure.  The benefits include less needle sticks, lab draws from the catheter and patient may be discharged home with the catheter.  Risks include, but not limited to, infection, bleeding, blood clot (thrombus formation), and puncture of an artery; nerve damage and irregular heat beat.  Alternatives to this procedure were also discussed.  PICC/Midline Placement Documentation  PICC Double Lumen 07/31/15 PICC Right Brachial 47 cm 0 cm (Active)  Indication for Insertion or Continuance of Line Prolonged intravenous therapies 07/31/2015  3:00 PM  Exposed Catheter (cm) 0 cm 07/31/2015  3:00 PM  Dressing Change Due 08/07/15 07/31/2015  3:00 PM       Jule Economy Horton 07/31/2015, 3:56 PM

## 2015-07-31 NOTE — Progress Notes (Signed)
CT Surgery PM Rounds   no BM, no nausea, abd soft Had stable day

## 2015-07-31 NOTE — Progress Notes (Addendum)
TCTS DAILY ICU PROGRESS NOTE                   [address]            [place] [address]          [phone]   6 Days Post-Op Procedure(s) (LRB): TRANSHIATAL TOTAL ESOPHAGECTOMY COMPLETE PYLOROMYOTOMY (N/A) FEEDING JEJUNOSTOMY (N/A) VIDEO BRONCHOSCOPY (N/A)  Total Length of Stay:  LOS: 6 days   Subjective:  Ronald Johnson has no new complaints.  He states he is now voiding back to normal.  No BM, is passing gas  Objective: Vital signs in last 24 hours: Temp:  [97.5 F (36.4 C)-99 F (37.2 C)] 98 F (36.7 C) (12/12 0411) Pulse Rate:  [84-106] 104 (12/12 0700) Cardiac Rhythm:  [-] Normal sinus rhythm;Sinus tachycardia (12/11 2000) Resp:  [11-23] 18 (12/12 0700) BP: (111-180)/(53-94) 119/68 mmHg (12/12 0700) SpO2:  [90 %-100 %] 91 % (12/12 0700) Weight:  [249 lb 9 oz (113.2 kg)] 249 lb 9 oz (113.2 kg) (12/12 0500)  Filed Weights   07/29/15 0800 07/30/15 0715 07/31/15 0500  Weight: 250 lb 7.1 oz (113.6 kg) 242 lb 4.6 oz (109.9 kg) 249 lb 9 oz (113.2 kg)    Weight change: -8 lb 2.5 oz (-3.7 kg)   Intake/Output from previous day: 12/11 0701 - 12/12 0700 In: 2310 [I.V.:650; NG/GT:1390] Out: 1515 [Urine:1055; Emesis/NG output:400; Chest Tube:60]  Current Meds: Scheduled Meds: . antiseptic oral rinse  7 mL Mouth Rinse q12n4p  . chlorhexidine  15 mL Mouth Rinse BID  . enoxaparin (LOVENOX) injection  40 mg Subcutaneous Q12H  . HYDROmorphone   Intravenous 6 times per day  . Influenza vac split quadrivalent PF  0.5 mL Intramuscular Tomorrow-1000  . insulin aspart  0-24 Units Subcutaneous 6 times per day  . insulin detemir  50 Units Subcutaneous BID  . levalbuterol  0.63 mg Nebulization Q6H  . pantoprazole sodium  40 mg Per J Tube Q1200  . pneumococcal 23 valent vaccine  0.5 mL Intramuscular Tomorrow-1000  . sodium chloride  10 mL Intravenous Q12H   Continuous Infusions: . dextrose 5 % and 0.9% NaCl 25 mL/hr at 07/31/15 0700  . feeding supplement (VITAL AF  1.2 CAL) 1,000 mL (07/31/15 0700)   PRN Meds:.ALPRAZolam, diphenhydrAMINE **OR** diphenhydrAMINE, naloxone **AND** sodium chloride, ondansetron (ZOFRAN) IV  General appearance: alert, cooperative and no distress Heart: regular rate and rhythm Lungs: clear to auscultation bilaterally Abdomen: soft, non-tender; bowel sounds normal; no masses,  no organomegaly Extremities: extremities normal, atraumatic, no cyanosis or edema Wound: clean and dry  Lab Results: CBC: Recent Labs  07/30/15 0427 07/31/15 0406  WBC 5.5 6.0  HGB 8.2* 8.4*  HCT 25.4* 27.1*  PLT 110* 115*   BMET:  Recent Labs  07/30/15 0427 07/31/15 0406  NA 145 146*  K 3.5 3.7  CL 110 110  CO2 29 27  GLUCOSE 136* 123*  BUN 31* 31*  CREATININE 1.16 1.06  CALCIUM 8.8* 8.9    PT/INR: No results for input(s): LABPROT, INR in the last 72 hours. Radiology: Dg Chest Port 1 View  07/31/2015  CLINICAL DATA:  Respiratory complication EXAM: PORTABLE CHEST 1 VIEW COMPARISON:  07/30/2015 FINDINGS: Left chest tube and NG tube remain in place, unchanged. No visible pneumothorax. Bibasilar head increasing bibasilar opacities, likely atelectasis. Heart is normal size. IMPRESSION: Increasing bibasilar atelectasis.  No visible pneumothorax. Electronically Signed   By: Rolm Baptise M.D.   On: 07/31/2015 07:23  Assessment/Plan: S/P Procedure(s) (LRB): TRANSHIATAL TOTAL ESOPHAGECTOMY COMPLETE PYLOROMYOTOMY (N/A) FEEDING JEJUNOSTOMY (N/A) VIDEO BRONCHOSCOPY (N/A)  1. CV- hemodynamically stable 2. Pulm- chest tube continues to have low output, 60 cc output yesterday 3. GI- tube feeds at 51ml/hr patient tolerating without N/V... Goal is 80 will increase as allows,    NG tube continues to have increased output 400 cc yesterday... Leave in place for now 4. Dispo- patient stable,  chest tube for now, urinary function has returned to normal,  Swallow study today vs. tomorrow    Ahmed Prima, [name] 07/31/2015 7:53 AM  Gastrogaffin  swallow tomorrow, d/c ng tube today minimal drainage from left neck drain Increase tube feeding Glucose better controlled  I have seen and examined Ronald Johnson and agree with the above assessment  and plan.  [name] MD Beeper [phone] Office [phone] 07/31/2015 8:43 AM

## 2015-08-01 ENCOUNTER — Inpatient Hospital Stay (HOSPITAL_COMMUNITY): Payer: Medicare Other

## 2015-08-01 LAB — GLUCOSE, CAPILLARY
Glucose-Capillary: 102 mg/dL — ABNORMAL HIGH (ref 65–99)
Glucose-Capillary: 103 mg/dL — ABNORMAL HIGH (ref 65–99)
Glucose-Capillary: 124 mg/dL — ABNORMAL HIGH (ref 65–99)
Glucose-Capillary: 125 mg/dL — ABNORMAL HIGH (ref 65–99)
Glucose-Capillary: 129 mg/dL — ABNORMAL HIGH (ref 65–99)
Glucose-Capillary: 136 mg/dL — ABNORMAL HIGH (ref 65–99)
Glucose-Capillary: 66 mg/dL (ref 65–99)
Glucose-Capillary: 93 mg/dL (ref 65–99)

## 2015-08-01 LAB — CBC
HCT: 26.1 % — ABNORMAL LOW (ref 39.0–52.0)
Hemoglobin: 8.1 g/dL — ABNORMAL LOW (ref 13.0–17.0)
MCH: 32 pg (ref 26.0–34.0)
MCHC: 31 g/dL (ref 30.0–36.0)
MCV: 103.2 fL — ABNORMAL HIGH (ref 78.0–100.0)
Platelets: 107 10*3/uL — ABNORMAL LOW (ref 150–400)
RBC: 2.53 MIL/uL — ABNORMAL LOW (ref 4.22–5.81)
RDW: 13.2 % (ref 11.5–15.5)
WBC: 5.9 10*3/uL (ref 4.0–10.5)

## 2015-08-01 LAB — BASIC METABOLIC PANEL
Anion gap: 8 (ref 5–15)
BUN: 30 mg/dL — ABNORMAL HIGH (ref 6–20)
CO2: 30 mmol/L (ref 22–32)
Calcium: 8.9 mg/dL (ref 8.9–10.3)
Chloride: 108 mmol/L (ref 101–111)
Creatinine, Ser: 1.08 mg/dL (ref 0.61–1.24)
GFR calc Af Amer: >60 mL/min (ref >60)
GFR calc non Af Amer: >60 mL/min (ref >60)
Glucose, Bld: 154 mg/dL — ABNORMAL HIGH (ref 65–99)
Potassium: 3.8 mmol/L (ref 3.5–5.1)
Sodium: 146 mmol/L — ABNORMAL HIGH (ref 135–145)

## 2015-08-01 MED ORDER — INSULIN DETEMIR 100 UNIT/ML ~~LOC~~ SOLN
15.0000 [IU] | Freq: Two times a day (BID) | SUBCUTANEOUS | Status: DC
  Administered 2015-08-01 – 2015-08-06 (×10): 15 [IU] via SUBCUTANEOUS
  Filled 2015-08-01 (×12): qty 0.15

## 2015-08-01 MED ORDER — ACETAMINOPHEN 160 MG/5ML PO SOLN
325.0000 mg | ORAL | Status: DC | PRN
  Administered 2015-08-03: 325 mg
  Filled 2015-08-01: qty 20.3

## 2015-08-01 MED ORDER — OXYCODONE HCL 5 MG/5ML PO SOLN
5.0000 mg | ORAL | Status: DC | PRN
  Administered 2015-08-02: 5 mg via JEJUNOSTOMY
  Filled 2015-08-01: qty 5

## 2015-08-01 MED ORDER — IOHEXOL 300 MG/ML  SOLN
150.0000 mL | Freq: Once | INTRAMUSCULAR | Status: AC | PRN
  Administered 2015-08-01: 150 mL via ORAL

## 2015-08-01 MED ORDER — INSULIN DETEMIR 100 UNIT/ML ~~LOC~~ SOLN
30.0000 [IU] | Freq: Two times a day (BID) | SUBCUTANEOUS | Status: DC
  Filled 2015-08-01: qty 0.3

## 2015-08-01 MED ORDER — DEXTROSE 50 % IV SOLN
INTRAVENOUS | Status: AC
  Filled 2015-08-01: qty 50

## 2015-08-01 MED ORDER — DEXTROSE 50 % IV SOLN
25.0000 mL | Freq: Once | INTRAVENOUS | Status: AC
  Administered 2015-08-01: 25 mL via INTRAVENOUS

## 2015-08-01 NOTE — Progress Notes (Signed)
Patient ID: Ronald Johnson, male   DOB: May 29, 1946, 69 y.o.   MRN: [id number]  SICU Evening Rounds:  Hemodynamically stable. sats 95% CBG's have been under good control with some values down in the 60's will decrease Levemir. He is on continuous tube feeds. Nothing po yet.

## 2015-08-01 NOTE — Progress Notes (Signed)
Initial Nutrition Assessment  DOCUMENTATION CODES:   Obesity unspecified  INTERVENTION:   Continue Vital AF 1.2 formula.  Advancement per MD discretion.  Recommend goal rate of 80 ml/hr.  Goal rate TF regimen to provide 2304 kcals, 144 gm protein, 1605 ml of free water  NUTRITION DIAGNOSIS:   Inadequate oral intake related to inability to eat as evidenced by NPO status, ongoing  GOAL:   Patient will meet greater than or equal to 90% of their needs, progressing  MONITOR:   Diet advancement, TF tolerance, Labs, Weight trends, I & O's  ASSESSMENT:   69 y.o. 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 The patient has a history of [name]'s esophagus dating back to biopsies in 2007. In the spring of this year he had begun having epigastric discomfort when eating and some mild trouble swallowing. A cardiac stress test was performed which was negative for ischemia endoscopy and ultimately endoscopic ultrasound were performed in late July, confirming adenocarcinoma the esophagus.  Patient s/p procedure 12/6: TRANSHIATAL TOTAL ESOPHAGECTOMY  COMPLETE PYLOROMYOTOMY  FEEDING JEJUNOSTOMY   Vital AF 1.2 formula infusing at 60 ml/hr via J-tube providing 1728 kcals, 108 gm protein, 1168  Ml of free water.  Pt tolerating well.  No N/V. NGT D/C'd.  For swallow study this AM.  Diet Order:  Diet NPO time specified  Skin:  Reviewed, no issues  Last BM:  N/A  Height:   Ht Readings from Last 1 Encounters:  07/25/15 6\' 4"  (1.93 m)    Weight:   Wt Readings from Last 1 Encounters:  08/01/15 245 lb 2.4 oz (111.2 kg)    Ideal Body Weight:  91.8 kg  BMI:  Body mass index is 29.85 kg/(m^2).  Estimated Nutritional Needs:   Kcal:  2200-2400  Protein:  135-145 gm  Fluid:  2.2-2.4 L  EDUCATION NEEDS:   No education needs identified at this time  Arthur Holms, RD, LDN Pager #: [phone] After-Hours Pager #:  [phone]

## 2015-08-01 NOTE — Care Management Note (Signed)
Case Management Note  Patient Details  Name: Ronald Johnson MRN: [id number] Date of Birth: Oct 03, 1945  Subjective/Objective:     Adenocarcinoma of the esophagogastric junction, Transhiatal total Esophagectomy Complete Pyloromyotomy, Feeding Jejunostomy            Action/Plan: NCM spoke with pt and gave permission to speak with wife, Audrea Muscat. Pt independent at home prior to admission. Pt will need HH RN and tube feeding supplies at dc. NCM will continue to follow for dc needs.   Expected Discharge Date:                  Expected Discharge Plan:  [place]  In-House Referral:  Clinical Social Work  Discharge planning Services  CM Consult   Status of Service:  In process, will continue to follow  Medicare Important Message Given:  Yes Date Medicare IM Given:    Medicare IM give by:    Date Additional Medicare IM Given:    Additional Medicare Important Message give by:     If discussed at [place] of Stay Meetings, dates discussed:    Additional Comments:  Erenest Rasher, RN 08/01/2015, 2:43 PM

## 2015-08-01 NOTE — Care Management Important Message (Signed)
Important Message  Patient Details  Name: Ronald Johnson MRN: [id number] Date of Birth: Apr 23, 1946   Medicare Important Message Given:  Yes    Nathen May 08/01/2015, 10:41 AM

## 2015-08-01 NOTE — Progress Notes (Addendum)
TCTS DAILY ICU PROGRESS NOTE                   [address]            ,[place] [address]          [phone]   7 Days Post-Op Procedure(s) (LRB): TRANSHIATAL TOTAL ESOPHAGECTOMY COMPLETE PYLOROMYOTOMY (N/A) FEEDING JEJUNOSTOMY (N/A) VIDEO BRONCHOSCOPY (N/A)  Total Length of Stay:  LOS: 7 days   Subjective:  Ronald Johnson has no complaints.  He continues to deny N/V...  No BM, continues to pass flatus  Objective: Vital signs in last 24 hours: Temp:  [98 F (36.7 C)-99.1 F (37.3 C)] 98.2 F (36.8 C) (12/13 0733) Pulse Rate:  [81-110] 96 (12/13 0716) Cardiac Rhythm:  [-] Normal sinus rhythm (12/13 0400) Resp:  [11-22] 18 (12/13 0716) BP: (105-160)/(57-106) 150/78 mmHg (12/13 0716) SpO2:  [93 %-97 %] 97 % (12/13 0716) Weight:  [245 lb 2.4 oz (111.2 kg)] 245 lb 2.4 oz (111.2 kg) (12/13 0500)  Filed Weights   07/30/15 0715 07/31/15 0500 08/01/15 0500  Weight: 242 lb 4.6 oz (109.9 kg) 249 lb 9 oz (113.2 kg) 245 lb 2.4 oz (111.2 kg)    Weight change: 2 lb 13.9 oz (1.3 kg)  Intake/Output from previous day: 12/12 0701 - 12/13 0700 In: 1840 [I.V.:290; NG/GT:1350] Out: 1370 [Urine:1300; Chest Tube:70]  Current Meds: Scheduled Meds: . antiseptic oral rinse  7 mL Mouth Rinse q12n4p  . chlorhexidine  15 mL Mouth Rinse BID  . enoxaparin (LOVENOX) injection  40 mg Subcutaneous Q12H  . HYDROmorphone   Intravenous 6 times per day  . Influenza vac split quadrivalent PF  0.5 mL Intramuscular Tomorrow-1000  . insulin aspart  0-24 Units Subcutaneous 6 times per day  . insulin detemir  40 Units Subcutaneous BID  . levalbuterol  0.63 mg Nebulization Q6H  . pantoprazole sodium  40 mg Per J Tube Q1200  . pneumococcal 23 valent vaccine  0.5 mL Intramuscular Tomorrow-1000  . sodium chloride  10 mL Intravenous Q12H   Continuous Infusions: . dextrose 5 % and 0.9% NaCl 10 mL/hr at 08/01/15 0400  . feeding supplement (VITAL AF 1.2 CAL) 1,000 mL (07/31/15 1900)   PRN  Meds:.ALPRAZolam, diphenhydrAMINE **OR** diphenhydrAMINE, iohexol, naloxone **AND** sodium chloride, ondansetron (ZOFRAN) IV  General appearance: alert, cooperative and no distress Heart: regular rate and rhythm Lungs: clear to auscultation bilaterally Abdomen: soft, non-tender; bowel sounds normal; no masses,  no organomegaly Wound: clean and dry  Lab Results: CBC: Recent Labs  07/31/15 0406 08/01/15 0502  WBC 6.0 5.9  HGB 8.4* 8.1*  HCT 27.1* 26.1*  PLT 115* 107*   BMET:  Recent Labs  07/31/15 0406 08/01/15 0502  NA 146* 146*  K 3.7 3.8  CL 110 108  CO2 27 30  GLUCOSE 123* 154*  BUN 31* 30*  CREATININE 1.06 1.08  CALCIUM 8.9 8.9    PT/INR: No results for input(s): LABPROT, INR in the last 72 hours. Radiology: Dg Chest Port 1 View  07/31/2015  CLINICAL DATA:  Right side PICC plmt EXAM: PORTABLE CHEST 1 VIEW COMPARISON:  07/31/2015. FINDINGS: Nasogastric tube has been removed. A right-sided PICC line is difficult to follow centrally but likely terminates at the mid to low SVC. Left chest tube is unchanged in position. Radiopaque object projecting over the left lung apex may be external to the patient. Normal heart size. No pleural fluid. Suspect a 5% left apical pneumothorax. Persistent bibasilar atelectasis.  IMPRESSION: Placement of a right-sided PICC line, with tip at the mid to low SVC. Left chest tube remaining in place with suspicion of a 5% left apical pneumothorax. Electronically Signed   By: Abigail Miyamoto M.D.   On: 07/31/2015 16:29     Assessment/Plan: S/P Procedure(s) (LRB): TRANSHIATAL TOTAL ESOPHAGECTOMY COMPLETE PYLOROMYOTOMY (N/A) FEEDING JEJUNOSTOMY (N/A) VIDEO BRONCHOSCOPY (N/A)  1. CV- hemodynamically stable 2. Pulm- chest tube output remains low, if swallow is without leak can possibly remove later today 3. GI- continue tube feeds, increased to 60 ml/hr- tolerating without N/V, abd distention, will continue to slowly increase rate 4. Dispo- patient  stable, for swallow study this morning, neck drain remains in place with low output, continue current care     [name], [place] 08/01/2015 7:40 AM ]  gastroffin swallow- no leak Start sips of liquids  I have seen and examined Ronald Johnson and agree with the above assessment  and plan.  [name] MD Beeper [phone] Office [phone] 08/01/2015 2:12 PM

## 2015-08-02 ENCOUNTER — Inpatient Hospital Stay (HOSPITAL_COMMUNITY): Payer: Medicare Other

## 2015-08-02 LAB — GLUCOSE, CAPILLARY
Glucose-Capillary: 136 mg/dL — ABNORMAL HIGH (ref 65–99)
Glucose-Capillary: 128 mg/dL — ABNORMAL HIGH (ref 65–99)
Glucose-Capillary: 222 mg/dL — ABNORMAL HIGH (ref 65–99)
Glucose-Capillary: 219 mg/dL — ABNORMAL HIGH (ref 65–99)
Glucose-Capillary: 163 mg/dL — ABNORMAL HIGH (ref 65–99)

## 2015-08-02 MED ORDER — GERHARDT'S BUTT CREAM
TOPICAL_CREAM | Freq: Two times a day (BID) | CUTANEOUS | Status: DC
  Administered 2015-08-02 – 2015-08-03 (×2): via TOPICAL
  Administered 2015-08-03: 1 via TOPICAL
  Administered 2015-08-04 – 2015-08-06 (×4): via TOPICAL
  Filled 2015-08-02: qty 1

## 2015-08-02 MED ORDER — VITAL AF 1.2 CAL PO LIQD
1000.0000 mL | ORAL | Status: DC
  Administered 2015-08-02 – 2015-08-04 (×5): 1000 mL
  Filled 2015-08-02 (×10): qty 1000

## 2015-08-02 NOTE — Progress Notes (Signed)
Patient ID: Ronald Johnson, male   DOB: 10/19/1945, 69 y.o.   MRN: [id number] EVENING ROUNDS NOTE :     [address]       [place] [address]             [phone]                 8 Days Post-Op Procedure(s) (LRB): TRANSHIATAL TOTAL ESOPHAGECTOMY COMPLETE PYLOROMYOTOMY (N/A) FEEDING JEJUNOSTOMY (N/A) VIDEO BRONCHOSCOPY (N/A)  Total Length of Stay:  LOS: 8 days  BP 122/69 mmHg  Pulse 94  Temp(Src) 98.5 F (36.9 C) (Oral)  Resp 25  Ht 6\' 4"  (1.93 m)  Wt 244 lb 4.3 oz (110.8 kg)  BMI 29.75 kg/m2  SpO2 93%  .Intake/Output      12/14 0701 - 12/15 0700   P.O. 660   I.V. (mL/kg) 120 (1.1)   Other 30   NG/GT 760   Total Intake(mL/kg) 1570 (14.2)   Urine (mL/kg/hr)    Drains    Stool    Chest Tube    Total Output     Net +1570       Urine Occurrence 4 x   Stool Occurrence 4 x     . dextrose 5 % and 0.9% NaCl 10 mL/hr at 08/02/15 1800  . feeding supplement (VITAL AF 1.2 CAL) 1,000 mL (08/02/15 1853)     Lab Results  Component Value Date   WBC 5.9 08/01/2015   HGB 8.1* 08/01/2015   HCT 26.1* 08/01/2015   PLT 107* 08/01/2015   GLUCOSE 154* 08/01/2015   CHOL 170 12/02/2014   TRIG * 12/02/2014    449.0 Triglyceride is over 400; calculations on Lipids are invalid.   HDL 32.40* 12/02/2014   LDLDIRECT 93.0 12/02/2014   LDLCALC 94 06/17/2014   ALT 23 07/29/2015   AST 19 07/29/2015   NA 146* 08/01/2015   K 3.8 08/01/2015   CL 108 08/01/2015   CREATININE 1.08 08/01/2015   BUN 30* 08/01/2015   CO2 30 08/01/2015   TSH 2.50 12/02/2014   PSA 0.85 12/02/2014   INR 1.08 07/24/2015   HGBA1C 9.2 06/05/2015   MICROALBUR 20.9* 12/02/2014   Tolerating clear liquids Waiting for bed in step down unit Left neck penrose drain pulled out slightly   [name] MD  [place] Office [phone] 08/02/2015 8:39 PM

## 2015-08-02 NOTE — Progress Notes (Addendum)
TCTS DAILY ICU PROGRESS NOTE                   [address]            [place] [address]          [phone]   8 Days Post-Op Procedure(s) (LRB): TRANSHIATAL TOTAL ESOPHAGECTOMY COMPLETE PYLOROMYOTOMY (N/A) FEEDING JEJUNOSTOMY (N/A) VIDEO BRONCHOSCOPY (N/A)  Total Length of Stay:  LOS: 8 days   Subjective:  Mr. Carle continues to have no complaints.  He is anxious to eat and asks how fast his diet will progress.  I explained to the patient that it will be a slow progression and its very important when he does start a diet to take small sips, meals and to not binge himself full as he will become sick.  Objective: Vital signs in last 24 hours: Temp:  [97.5 F (36.4 C)-98.6 F (37 C)] 98.2 F (36.8 C) (12/14 0402) Pulse Rate:  [78-110] 79 (12/14 0700) Cardiac Rhythm:  [-] Normal sinus rhythm (12/14 0740) Resp:  [12-24] 16 (12/14 0700) BP: (112-149)/(55-86) 139/71 mmHg (12/14 0700) SpO2:  [86 %-98 %] 95 % (12/14 0700) Weight:  [244 lb 4.3 oz (110.8 kg)] 244 lb 4.3 oz (110.8 kg) (12/14 0600)  Filed Weights   07/31/15 0500 08/01/15 0500 08/02/15 0600  Weight: 249 lb 9 oz (113.2 kg) 245 lb 2.4 oz (111.2 kg) 244 lb 4.3 oz (110.8 kg)    Weight change: -14.1 oz (-0.4 kg)   Hemodynamic parameters for last 24 hours:    Intake/Output from previous day: 12/13 0701 - 12/14 0700 In: 2436.4 [P.O.:720; I.V.:276.4; NG/GT:1380] Out: 600 [Urine:600]  Current Meds: Scheduled Meds: . antiseptic oral rinse  7 mL Mouth Rinse q12n4p  . chlorhexidine  15 mL Mouth Rinse BID  . enoxaparin (LOVENOX) injection  40 mg Subcutaneous Q12H  . Influenza vac split quadrivalent PF  0.5 mL Intramuscular Tomorrow-1000  . insulin aspart  0-24 Units Subcutaneous 6 times per day  . insulin detemir  15 Units Subcutaneous BID  . levalbuterol  0.63 mg Nebulization Q6H  . pantoprazole sodium  40 mg Per J Tube Q1200  . pneumococcal 23 valent vaccine  0.5 mL Intramuscular Tomorrow-1000  .  sodium chloride  10 mL Intravenous Q12H   Continuous Infusions: . dextrose 5 % and 0.9% NaCl 10 mL/hr at 08/01/15 1100  . feeding supplement (VITAL AF 1.2 CAL) 1,000 mL (08/02/15 0500)   PRN Meds:.oxyCODONE **AND** acetaminophen, ALPRAZolam  General appearance: alert, cooperative and no distress Heart: regular rate and rhythm Lungs: clear to auscultation bilaterally Abdomen: soft, non-tender; bowel sounds normal; no masses,  no organomegaly Wound: clean and dry  Lab Results: CBC: Recent Labs  07/31/15 0406 08/01/15 0502  WBC 6.0 5.9  HGB 8.4* 8.1*  HCT 27.1* 26.1*  PLT 115* 107*   BMET:  Recent Labs  07/31/15 0406 08/01/15 0502  NA 146* 146*  K 3.7 3.8  CL 110 108  CO2 27 30  GLUCOSE 123* 154*  BUN 31* 30*  CREATININE 1.06 1.08  CALCIUM 8.9 8.9    PT/INR: No results for input(s): LABPROT, INR in the last 72 hours. Radiology: Dg Chest Port 1 View  08/02/2015  CLINICAL DATA:  History of esophageal carcinoma. Status post esophagectomy and video bronchoscopy 07/29/2015. EXAM: PORTABLE CHEST 1 VIEW COMPARISON:  Single view of the chest 07/31/2015 and 07/30/2015. FINDINGS: The patient's left chest tube has been removed. No pneumothorax is identified. Radiopaque object  over the left upper chest which may be a surgical drain is unchanged. There is mild bibasilar atelectasis. Heart size is normal. No pleural effusion is identified. IMPRESSION: Status post left chest tube removal. Negative for pneumothorax. No new abnormality. Electronically Signed   By: Inge Rise M.D.   On: 08/02/2015 07:39   Dg Esophagus W/water Sol Cm  08/01/2015  CLINICAL DATA:  69 year old male with history of adenocarcinoma of the distal esophagus and gastroesophageal junction status post transhiatal total esophagectomy, cervical esophagogastrostomy, and pyloromyotomy. EXAM: ESOPHOGRAM/BARIUM SWALLOW TECHNIQUE: Single contrast examination was performed using 150 mL of Omnipaque 300. FLUOROSCOPY TIME:   If the device does not provide the exposure index: Fluoroscopy Time:  1 minutes and 37 seconds Number of Acquired Images:  25 series with multiple images. COMPARISON:  No priors. FINDINGS: Single contrast esophagram was performed, which demonstrated postoperative changes of esophagectomy and gastric pull-through. No evidence of anastomotic leak identified. No focal abnormalities. Contrast was noted to pass through the pylorus into the proximal duodenum. IMPRESSION: 1. Expected postoperative appearance following esophagectomy and gastric pull-through, without complicating features, as above. Electronically Signed   By: Vinnie Langton M.D.   On: 08/01/2015 13:29     Assessment/Plan: S/P Procedure(s) (LRB): TRANSHIATAL TOTAL ESOPHAGECTOMY COMPLETE PYLOROMYOTOMY (N/A) FEEDING JEJUNOSTOMY (N/A) VIDEO BRONCHOSCOPY (N/A)  1. CV- remains hemodynamically stable 2. Pulm- chest tube removed yesterday, no pneumothorax, effusion, some atelectasis present 3. GI- no anastomotic leak, will advance to clear liquid diet today if okay with Dr. Servando Snare, will increase tube feeds to 70 ml/hr..... Patient tolerating sips of clears with no nausea or vomiting 4. Dispo- patient stable, possibly advance diet today, increase tube feeds, continue current care, transfer to 3S     [name], [name] 08/02/2015 7:45 AM  Insulin requirements decreased, insulin dose decreased Advancing diet Transfer to 3 s or circle Chest tube out I have seen and examined [name] and agree with the above assessment  and plan.  [name] MD Beeper [phone] Office [phone] 08/02/2015 9:29 AM

## 2015-08-03 ENCOUNTER — Encounter: Payer: Self-pay | Admitting: *Deleted

## 2015-08-03 LAB — GLUCOSE, CAPILLARY
Glucose-Capillary: 159 mg/dL — ABNORMAL HIGH (ref 65–99)
Glucose-Capillary: 162 mg/dL — ABNORMAL HIGH (ref 65–99)
Glucose-Capillary: 192 mg/dL — ABNORMAL HIGH (ref 65–99)
Glucose-Capillary: 207 mg/dL — ABNORMAL HIGH (ref 65–99)
Glucose-Capillary: 226 mg/dL — ABNORMAL HIGH (ref 65–99)
Glucose-Capillary: 95 mg/dL (ref 65–99)

## 2015-08-03 MED ORDER — LEVALBUTEROL HCL 0.63 MG/3ML IN NEBU
0.6300 mg | INHALATION_SOLUTION | Freq: Three times a day (TID) | RESPIRATORY_TRACT | Status: DC
  Administered 2015-08-03: 0.63 mg via RESPIRATORY_TRACT
  Filled 2015-08-03: qty 3

## 2015-08-03 MED ORDER — LEVALBUTEROL HCL 0.63 MG/3ML IN NEBU
0.6300 mg | INHALATION_SOLUTION | Freq: Two times a day (BID) | RESPIRATORY_TRACT | Status: DC
  Administered 2015-08-03 – 2015-08-05 (×5): 0.63 mg via RESPIRATORY_TRACT
  Filled 2015-08-03 (×5): qty 3

## 2015-08-03 MED ORDER — DIAZEPAM 5 MG/ML PO CONC: 5.0000 mg | Freq: Three times a day (TID) | ORAL | Status: DC | PRN

## 2015-08-03 MED ORDER — HYDROCODONE-ACETAMINOPHEN 7.5-325 MG/15ML PO SOLN
10.0000 mL | ORAL | Status: DC | PRN
  Administered 2015-08-03 – 2015-08-06 (×11): 10 mL via ORAL
  Filled 2015-08-03 (×10): qty 15

## 2015-08-03 MED ORDER — HYDROCODONE-ACETAMINOPHEN 7.5-325 MG/15ML PO SOLN
10.0000 mL | Freq: Four times a day (QID) | ORAL | Status: DC | PRN
  Administered 2015-08-03: 10 mL via ORAL
  Filled 2015-08-03 (×2): qty 15

## 2015-08-03 MED ORDER — DIAZEPAM 5 MG PO TABS
5.0000 mg | ORAL_TABLET | Freq: Three times a day (TID) | ORAL | Status: DC | PRN
  Administered 2015-08-03 – 2015-08-04 (×2): 5 mg
  Filled 2015-08-03 (×3): qty 1

## 2015-08-03 NOTE — Progress Notes (Signed)
Attempted to call report to 3S.  Nurse unavailable.  Awaiting call to be returned.

## 2015-08-03 NOTE — Progress Notes (Signed)
Inpatient Diabetes Program Recommendations  AACE/ADA: New Consensus Statement on Inpatient Glycemic Control (2015)  Target Ranges:  Prepandial:   less than 140 mg/dL      Peak postprandial:   less than 180 mg/dL (1-2 hours)      Critically ill patients:  140 - 180 mg/dL   Review of Glycemic Control:  Results for ABED, CROXFORD (MRN [id number]) as of 08/03/2015 12:18  Ref. Range 08/02/2015 16:26 08/02/2015 19:16 08/02/2015 23:55 08/03/2015 03:56 08/03/2015 07:24 08/03/2015 11:24  Glucose-Capillary Latest Ref Range: 65-99 mg/dL 219 (H) 163 (H) 95 192 (H) 207 (H) 226 (H)    Diabetes history: Type 1 diabetes per history Outpatient Diabetes medications: NPH 300 units daily.   Current orders for Inpatient glycemic control:  TCTS q 4 hours, Levemir 15 units bid  Inpatient Diabetes Program Recommendations:    May consider increasing Levemir to 18 units bid.  Will follow.  Thanks, Adah Perl, RN, BC-ADM Inpatient Diabetes Coordinator Pager [phone] (8a-5p)

## 2015-08-03 NOTE — Progress Notes (Signed)
Gave report to [facility], RN and transferred patient and all patient belongings including mobile phone, charger, glasses, dentures, robe and personal bag with multiple items to 3S room 3.

## 2015-08-03 NOTE — Progress Notes (Addendum)
TCTS DAILY ICU PROGRESS NOTE                   [address]            [place] [address]          [phone]   9 Days Post-Op Procedure(s) (LRB): TRANSHIATAL TOTAL ESOPHAGECTOMY COMPLETE PYLOROMYOTOMY (N/A) FEEDING JEJUNOSTOMY (N/A) VIDEO BRONCHOSCOPY (N/A)  Total Length of Stay:  LOS: 9 days   Subjective:  Ronald Johnson states he feels okay this morning.  Tolerating liquid diet, without nausea or vomiting... No BM, + flatus  Objective: Vital signs in last 24 hours: Temp:  [98.1 F (36.7 C)-98.5 F (36.9 C)] 98.3 F (36.8 C) (12/15 0726) Pulse Rate:  [72-108] 86 (12/15 0700) Cardiac Rhythm:  [-] Normal sinus rhythm (12/15 0400) Resp:  [15-29] 21 (12/15 0700) BP: (117-140)/(66-90) 119/74 mmHg (12/15 0700) SpO2:  [88 %-98 %] 88 % (12/15 0700) Weight:  [243 lb 2.7 oz (110.3 kg)] 243 lb 2.7 oz (110.3 kg) (12/15 0500)  Filed Weights   08/01/15 0500 08/02/15 0600 08/03/15 0500  Weight: 245 lb 2.4 oz (111.2 kg) 244 lb 4.3 oz (110.8 kg) 243 lb 2.7 oz (110.3 kg)    Weight change: -1 lb 1.6 oz (-0.5 kg)   Intake/Output from previous day: 12/14 0701 - 12/15 0700 In: 2410 [P.O.:660; I.V.:130; NG/GT:1530] Out: 300 [Urine:300]  Current Meds: Scheduled Meds: . antiseptic oral rinse  7 mL Mouth Rinse q12n4p  . chlorhexidine  15 mL Mouth Rinse BID  . enoxaparin (LOVENOX) injection  40 mg Subcutaneous Q12H  . Blaise Palladino's butt cream   Topical BID  . Influenza vac split quadrivalent PF  0.5 mL Intramuscular Tomorrow-1000  . insulin aspart  0-24 Units Subcutaneous 6 times per day  . insulin detemir  15 Units Subcutaneous BID  . levalbuterol  0.63 mg Nebulization TID  . pantoprazole sodium  40 mg Per J Tube Q1200  . pneumococcal 23 valent vaccine  0.5 mL Intramuscular Tomorrow-1000  . sodium chloride  10 mL Intravenous Q12H   Continuous Infusions: . dextrose 5 % and 0.9% NaCl Stopped (08/02/15 2000)  . feeding supplement (VITAL AF 1.2 CAL) 1,000 mL (08/02/15 1853)     PRN Meds:.oxyCODONE **AND** acetaminophen, ALPRAZolam  General appearance: alert, cooperative and no distress Heart: regular rate and rhythm Lungs: clear to auscultation bilaterally Abdomen: soft, non-tender, mild distention Wound: clean and dry, neck drain with minimal output  Lab Results: CBC: Recent Labs  08/01/15 0502  WBC 5.9  HGB 8.1*  HCT 26.1*  PLT 107*   BMET:  Recent Labs  08/01/15 0502  NA 146*  K 3.8  CL 108  CO2 30  GLUCOSE 154*  BUN 30*  CREATININE 1.08  CALCIUM 8.9    PT/INR: No results for input(s): LABPROT, INR in the last 72 hours. Radiology: No results found.   Assessment/Plan: S/P Procedure(s) (LRB): TRANSHIATAL TOTAL ESOPHAGECTOMY COMPLETE PYLOROMYOTOMY (N/A) FEEDING JEJUNOSTOMY (N/A) VIDEO BRONCHOSCOPY (N/A)  1. CV- remains hemodynamically stable 2. Pulm- no acute issues continue IS 3. GI- no anastomatotic leak, tolerating clear liquid diet, continue today vs. Progressing to fulls, tube feeds at 70 ml/hr will increase to goal rate tomorrow if continues to tolerate diet 4. Dispo- patient stable, awaiting transfer to step down, continue clear diet for now... May be able to progress later today     [name], [name] 08/03/2015 7:40 AM  Advancing diet, to full liquids today Waiting for bed to transfer out  of unit I have seen and examined Ronald Johnson and agree with the above assessment  and plan.  [name] MD Beeper [phone] Office [phone] 08/03/2015 12:36 PM

## 2015-08-03 NOTE — Progress Notes (Signed)
Oncology Nurse Navigator Documentation  Oncology Nurse Navigator Flowsheets 08/03/2015  Referral date to RadOnc/MedOnc -  Navigator Encounter Type Surgical F/U  Patient Visit Type Inpatient  Treatment Phase Other-postop day #9  Barriers/Navigation Needs No barriers at this time  Education -  Interventions Other-supportive listening and explained rationale for slow diet progression  Referrals -  Education Method -  Support Groups/Services GI-encouraged him to contact his peer support from support group.  Time Spent with Patient 15  Brief visit with wife and patient. Feedings up to 60ml/hr--says he is having diarrhea. Anxious to start eating solid food. Pain is controlled. Plans to transfer to another room when available.  Dr. [name] will see him on 09/01/15.

## 2015-08-04 LAB — GLUCOSE, CAPILLARY
Glucose-Capillary: 212 mg/dL — ABNORMAL HIGH (ref 65–99)
Glucose-Capillary: 144 mg/dL — ABNORMAL HIGH (ref 65–99)
Glucose-Capillary: 163 mg/dL — ABNORMAL HIGH (ref 65–99)
Glucose-Capillary: 180 mg/dL — ABNORMAL HIGH (ref 65–99)
Glucose-Capillary: 128 mg/dL — ABNORMAL HIGH (ref 65–99)
Glucose-Capillary: 227 mg/dL — ABNORMAL HIGH (ref 65–99)

## 2015-08-04 MED ORDER — ENSURE ENLIVE PO LIQD
237.0000 mL | Freq: Two times a day (BID) | ORAL | Status: DC
  Administered 2015-08-04 – 2015-08-06 (×3): 237 mL via ORAL

## 2015-08-04 NOTE — Progress Notes (Signed)
RN entered pt's room at beginning of shift to check in and patient stated he is angry at Dr. Servando Snare because he said "he was only going to be here 7-10 days. It's now day #10 and he still has to stay in the hospital". RN provided emotional support and discussed pt's need to stay for continued monitoring. Pt given valium 5mg  per previous RN at 1830. This RN will continue to monitor.

## 2015-08-04 NOTE — Progress Notes (Addendum)
       [address]       ,[place] [address]             [phone]          10 Days Post-Op Procedure(s) (LRB): TRANSHIATAL TOTAL ESOPHAGECTOMY COMPLETE PYLOROMYOTOMY (N/A) FEEDING JEJUNOSTOMY (N/A) VIDEO BRONCHOSCOPY (N/A)  Subjective: Feels well, no complaints. Tolerating fulls. No nausea or dysphagia.   Objective: Vital signs in last 24 hours: Patient Vitals for the past 24 hrs:  BP Temp Temp src Pulse Resp SpO2  08/04/15 0745 - - - - - 95 %  08/04/15 0325 128/81 mmHg 98 F (36.7 C) Oral 88 16 96 %  08/03/15 2334 - 98 F (36.7 C) Oral - - -  08/03/15 2325 124/71 mmHg - - 83 20 93 %  08/03/15 2133 134/60 mmHg - - 83 (!) 22 96 %  08/03/15 2129 - 97.3 F (36.3 C) Oral - - -  08/03/15 2100 (!) 115/58 mmHg - - 88 (!) 25 95 %  08/03/15 2000 126/66 mmHg - - 91 20 92 %  08/03/15 1950 - - - 86 19 97 %  08/03/15 1928 - 97.7 F (36.5 C) Oral - - -  08/03/15 1900 139/78 mmHg - - 82 (!) 23 95 %  08/03/15 1800 - - - - - 93 %  08/03/15 1700 - - - - - 96 %  08/03/15 1623 - 97.7 F (36.5 C) Oral - - -  08/03/15 1600 - - - 84 (!) 27 94 %  08/03/15 1500 138/71 mmHg - - 83 (!) 21 94 %  08/03/15 1200 109/69 mmHg - - 72 19 91 %  08/03/15 1150 - 98.4 F (36.9 C) Oral - - -  08/03/15 1100 114/65 mmHg - - 77 18 90 %  08/03/15 1000 132/68 mmHg - - 82 (!) 24 94 %  08/03/15 0900 - - - 84 (!) 21 100 %  08/03/15 0800 125/65 mmHg - - 84 19 91 %   Current Weight  08/03/15 243 lb 2.7 oz (110.3 kg)     Intake/Output from previous day: 12/15 0701 - 12/16 0700 In: [id number] [P.O.:555; I.V.:10; NG/GT:980] Out: 750 [Urine:750]  CBGs [phone]   PHYSICAL EXAM:  Heart: RRR Lungs: Clear Wound: Abdominal and neck wounds clean and dry, no drainage from neck drain Abdomen: Soft, NT/ND.  +BS    Lab Results: CBC:No results for input(s): WBC, HGB, HCT, PLT in the last 72 hours. BMET: No results for input(s): NA, K, CL, CO2, GLUCOSE, BUN, CREATININE, CALCIUM in the  last 72 hours.  PT/INR: No results for input(s): LABPROT, INR in the last 72 hours.    Assessment/Plan: S/P Procedure(s) (LRB): TRANSHIATAL TOTAL ESOPHAGECTOMY COMPLETE PYLOROMYOTOMY (N/A) FEEDING JEJUNOSTOMY (N/A) VIDEO BRONCHOSCOPY (N/A) Tolerating fulls. Hopefully can advance to soft diet soon. Continue TFs for now.  Continue ambulation, pulm toilet. Advance neck drain. DM- CBGs improved. Continue Levemir and watch. Possibly home this weekend if he continues to progress, +/- TFs.   LOS: 10 days    COLLINS,GINA H 08/04/2015 Neck drain completely out now and removed Advance to soft diet when taking po ok convert to xarleto for the suggestion of preop PE left lower lobe I have seen and examined [name] and agree with the above assessment  and plan.  [name] MD Beeper [phone] Office [phone] 08/04/2015 8:43 AM

## 2015-08-04 NOTE — Progress Notes (Signed)
Nutrition Follow-up / Consult  DOCUMENTATION CODES:   Obesity unspecified  INTERVENTION:    Diet education provided.  Continue Soft diet with Ensure Enlive PO BID, each supplement provides 350 kcal and 20 grams of protein.  Continue Vital AF 1.2 via J-tube, consider nocturnal feedings at 70 ml/h x 12 hours per night to provide 1008 kcals, 63 gm protein, 681 ml free water daily.  NUTRITION DIAGNOSIS:   Inadequate oral intake related to inability to eat as evidenced by NPO status.  Ongoing  GOAL:   Patient will meet greater than or equal to 90% of their needs  Met  MONITOR:   Diet advancement, TF tolerance, Labs, Weight trends, I & O's  REASON FOR ASSESSMENT:   Consult Enteral/tube feeding initiation and management  ASSESSMENT:   69 y.o. 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 The patient has a history of [name]'s esophagus dating back to biopsies in 2007. In the spring of this year he had begun having epigastric discomfort when eating and some mild trouble swallowing. A cardiac stress test was performed which was negative for ischemia endoscopy and ultimately endoscopic ultrasound were performed in late July, confirming adenocarcinoma the esophagus.  Patient reports that he has been eating poorly, c/o poor appetite. Likes Boost and Ensure supplements. Patient is currently receiving Vital AF 1.2 via J tube at 70 ml/h (1680 ml/day) to provide 2016 kcals, 126 gm protein, 1362 ml free water daily.   Provided "Esophageal Surgery Nutrition Therapy" handout from the Academy of [employer]. Left RD contact information for any questions.  Diet Order:  DIET SOFT Room service appropriate?: Yes; Fluid consistency:: Thin  Skin:  Reviewed, no issues  Last BM:  12/14  Height:   Ht Readings from Last 1 Encounters:  07/25/15 '6\' 4"'$  (1.93 m)    Weight:   Wt Readings from Last 1 Encounters:   08/04/15 263 lb 0.1 oz (119.3 kg)    Ideal Body Weight:  91.8 kg  BMI:  Body mass index is 32.03 kg/(m^2).  Estimated Nutritional Needs:   Kcal:  2200-2400  Protein:  135-145 gm  Fluid:  2.2-2.4 L  EDUCATION NEEDS:   No education needs identified at this time  Molli Barrows, [place], [place], [place] Pager [phone] After Hours Pager [phone]

## 2015-08-04 NOTE — Progress Notes (Signed)
Educated patient and patient's wife on care of J-tube. Demonstrated how to flush and answered questions. Wife was very receptive to teaching and asked appropriate questions.

## 2015-08-04 NOTE — Discharge Summary (Signed)
[address]       [place] [address]             [phone]              Discharge Summary  Name: Ronald Johnson DOB: 10-24-1945 69 y.o. 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 COLONIC POLYPS, ADENOMATOUS 08/01/2008    09/01/2012 also  . DIABETES MELLITUS, TYPE I 03/13/2007  . HYPERCHOLESTEROLEMIA 02/03/2008  . ANEMIA 08/15/2009  . Anxiety state, unspecified 12/15/2008  . DEPRESSION 03/13/2007  . HYPERTENSION 03/13/2007  . PULMONARY NODULE 11/24/2008  . GERD 03/13/2007  . TOBACCO ABUSE 11/24/2008  . EMPHYSEMA 11/08/2008  . Prostatism   . ED (erectile dysfunction)   . Zariel Capano's esophagus   . Hiatal hernia   . Diverticulitis   . Arthritis   . COPD (chronic obstructive pulmonary disease) ([place])   . Shortness of breath dyspnea     WITH EXERTION    History of questionable pulmonary embolus   Procedures: BRONCHOSCOPY -  07/25/2015 TRANSHIATAL TOTAL ESOPHAGECTOMY   CERVICAL ESOPHAGOGASTRECTOMY  PYLOROMYOTOMY  FEEDING JEJUNOSTOMY   HPI:  The patient is a 69 y.o. male with a history of Jessey Stehlin's esophagus, biopsy proven in 2007.  In the spring of this year, he began having epigastric discomfort when eating and some mild trouble swallowing. A cardiac stress test was performed which was negative for ischemia. Endoscopy and ultimately endoscopic ultrasound were performed in late July, confirming adenocarcinoma of the esophagus. The patient notes no weight loss, and in fact has gained approximately 15 pounds over the past 6 months. He had no blood in his stool until after the endoscopy.  CT scans showed no evidence of distant metastasis. There was a question of an  incidental pulmonary embolus in the subsegment of the left lower lobe. Venous dopplers showed no evidence of lower extremity DVT. The patient was seen by Dr. [name], and then referred to Dr. Servando Snare for surgical consideration.  It was agreed that the patient should proceed with concomitant chemotherapy and radiation, then consider surgical resection 6-8 weeks after completion of therapy.   The patient returned for follow up with Dr. Servando Snare recently after successfully completing courses of chemoradiation. At that time, it was felt that he should proceed with surgery.  All risks, benefits and alternatives of surgery were explained in detail, and the patient agreed to proceed.   Hospital Course:  The patient was admitted to [facility] on 07/25/2015. The patient was taken to the operating room and underwent the above procedure.    The postoperative course has generally been uneventful.  In light of his recent questionable PE, he was started on Lovenox. Tube feedings were started for nutritional support. Foley catheter was removed early on, but did require reinsertion due to urinary retention. This was left in place for an additional 48 hours and voiding trial was performed. The catheter was removed on postop day 5 and the patient was able to void without difficulty thereafter. He remained stable in the ICU and was able to ambulate with assistance.  A gastrograffin swallow was performed on postop day 7 and showed no evidence of anastomotic leak.  The patient was then started on sips of clear liquids. He tolerated this  well, and his diet has been slowly advanced as tolerated.   Presently, the patient is progressing well. He has been transferred to the stepdown unit. Chest tube has been removed and follow up chest x-rays have remained stable. He is tolerating a soft diet without difficulty. At discharge he will continue his tube feeds at 15ml/hr from 7pm-7am. His neck drain was slowly advanced and ultimately  removed. Lovenox was discontinued once he was taking po's and he was started on Xarelto for his history of PE. Incisions are healing well. He is ambulating in the halls without difficulty. Blood sugars have been controlled.  Final pathology revealed stage IIIB (yT3, N2, cM0, G3) poorly differentiated adenocarcinoma.  Overall, the patient is medically stable on today's date for discharge home.  Recent vital signs:  Filed Vitals:   08/06/15 0338 08/06/15 0821  BP: 144/77 124/73  Pulse: 89 77  Temp: 98.4 F (36.9 C) 98.2 F (36.8 C)  Resp: 19 18    Recent laboratory studies:  CBC:No results for input(s): WBC, HGB, HCT, PLT in the last 72 hours. BMET: No results for input(s): NA, K, CL, CO2, GLUCOSE, BUN, CREATININE, CALCIUM in the last 72 hours.  PT/INR: No results for input(s): LABPROT, INR in the last 72 hours.    Discharge Medications:     Medication List    STOP taking these medications        losartan-hydrochlorothiazide 100-12.5 MG tablet  Commonly known as:  HYZAAR     oxyCODONE-acetaminophen 5-325 MG tablet  Commonly known as:  PERCOCET/ROXICET      TAKE these medications        atorvastatin 10 MG tablet  Commonly known as:  LIPITOR  Take 1 tablet by mouth  every day     diazepam 5 MG tablet  Commonly known as:  VALIUM  Take 1 tablet (5 mg total) by mouth 2 (two) times daily as needed.     esomeprazole 40 MG capsule  Commonly known as:  NEXIUM  Take 1 capsule (40 mg total) by mouth daily at 12 noon.     feeding supplement (ENSURE ENLIVE) Liqd  Take 237 mLs by mouth 2 (two) times daily between meals.     feeding supplement (VITAL AF 1.2 CAL) Liqd  Place 1,000 mLs into feeding tube continuous. At 70 ml/hr from 7pm -7am     feeding supplement (VITAL AF 1.2 CAL) Liqd  Place 1,000 mLs into feeding tube continuous.     glucose blood test strip  Commonly known as:  PRECISION XTRA TEST STRIPS  Check blood sugar three times a day dx 250.01      HYDROcodone-acetaminophen 7.5-325 mg/15 ml solution  Commonly known as:  HYCET  Take 10 mLs by mouth every 4 (four) hours as needed for moderate pain.     insulin NPH Human 100 UNIT/ML injection  Commonly known as:  HUMULIN N  Inject 300 units into the skin every morning.     Insulin Syringe-Needle U-100 30G X 3/8" 1 ML Misc  Use to inject insulin 3 times daily.     Rivaroxaban 15 MG Tabs tablet  Commonly known as:  XARELTO  Take 1 tablet (15 mg total) by mouth 2 (two) times daily with a meal.     sildenafil 20 MG tablet  Commonly known as:  REVATIO  TAKE 1-5 TABLETS BY MOUTH ONCE DAILY AS NEEDED     venlafaxine XR 150 MG 24 hr capsule  Commonly known as:  EFFEXOR-XR  Take  150 mg by mouth daily with breakfast.       Discharge Instructions:  The patient is to refrain from driving, heavy lifting or strenuous activity.  May clean incisions daily with soap and water.  May continue soft diet. Flush jejunostomy tube daily as directed.    Follow Up: Follow-up Information    Follow up with [name], MD On 08/24/2015.   Specialty:  Cardiothoracic Surgery   Why:  Appointment is at 12:45, please get CXR at 12:15 at [facility] imaging located on first floor of [facility]   Contact information:   [address] [place] [address] [phone]         Jachelle Fluty, [place] 08/06/2015, 9:54 AM

## 2015-08-04 NOTE — Progress Notes (Signed)
Pts diagnosis not appropriate for Cardiac Rehab. Will not follow. Yves Dill CES, ACSM 10:41 AM 08/04/2015

## 2015-08-05 DIAGNOSIS — C160 Malignant neoplasm of cardia: Secondary | ICD-10-CM | POA: Diagnosis not present

## 2015-08-05 LAB — GLUCOSE, CAPILLARY
Glucose-Capillary: 216 mg/dL — ABNORMAL HIGH (ref 65–99)
Glucose-Capillary: 238 mg/dL — ABNORMAL HIGH (ref 65–99)
Glucose-Capillary: 104 mg/dL — ABNORMAL HIGH (ref 65–99)
Glucose-Capillary: 237 mg/dL — ABNORMAL HIGH (ref 65–99)
Glucose-Capillary: 157 mg/dL — ABNORMAL HIGH (ref 65–99)
Glucose-Capillary: 131 mg/dL — ABNORMAL HIGH (ref 65–99)

## 2015-08-05 MED ORDER — RIVAROXABAN 15 MG PO TABS
15.0000 mg | ORAL_TABLET | Freq: Two times a day (BID) | ORAL | Status: DC
  Administered 2015-08-05 – 2015-08-06 (×2): 15 mg via ORAL
  Filled 2015-08-05 (×2): qty 1

## 2015-08-05 MED ORDER — ENSURE ENLIVE PO LIQD: 237.0000 mL | Freq: Two times a day (BID) | ORAL | Status: DC

## 2015-08-05 MED ORDER — VITAL AF 1.2 CAL PO LIQD
1000.0000 mL | ORAL | Status: DC
  Administered 2015-08-05 (×2): 1000 mL
  Filled 2015-08-05 (×4): qty 1000

## 2015-08-05 MED ORDER — DIAZEPAM 5 MG PO TABS
5.0000 mg | ORAL_TABLET | Freq: Three times a day (TID) | ORAL | Status: DC | PRN
  Administered 2015-08-05 – 2015-08-06 (×3): 5 mg via ORAL
  Filled 2015-08-05 (×3): qty 1

## 2015-08-05 MED ORDER — HYDROCODONE-ACETAMINOPHEN 7.5-325 MG/15ML PO SOLN: 10.0000 mL | ORAL | Status: DC | PRN

## 2015-08-05 MED ORDER — VITAL AF 1.2 CAL PO LIQD: 1000.0000 mL | ORAL | Status: DC

## 2015-08-05 NOTE — Progress Notes (Signed)
Pt is refusing to be connected to tele monitor. Wants to be "free to walk around room and hall" will attempt to reconnect patient later.

## 2015-08-05 NOTE — Progress Notes (Signed)
Called to patients room by family to assess pt tube feedings. Assessment showed pt had disconnected his J-tube feeding from his J-tube port and his feeding were dripping on the floor. Education regarding need to call for assistance prior to disconnecting tubing was giving. Patient is refusing to be reconnected to feeds at this time. Will continue to monitor

## 2015-08-05 NOTE — Progress Notes (Signed)
UR COMPLETED  

## 2015-08-05 NOTE — Progress Notes (Signed)
Pt allowed RN to connect tube feedings at 10 pm. Continues to refuse to be connected to our monitor. Allows staff to assess his VS and rhythm Q 4 hours. Nursing will continue to monitor.

## 2015-08-05 NOTE — Progress Notes (Signed)
Pt continues to refuse both continuous tele monitoring and tube feedings. Receiving nurse made aware.

## 2015-08-05 NOTE — Progress Notes (Signed)
ANTICOAGULATION CONSULT NOTE - Initial Consult  Pharmacy Consult for Xarelto Indication: pulmonary embolus  No Known Allergies  Patient Measurements: Height: 6\' 4"  (193 cm) Weight: 262 lb 2 oz (118.9 kg) IBW/kg (Calculated) : 86.8  Vital Signs: Temp: 97.9 F (36.6 C) (12/17 0820) Temp Source: Oral (12/17 0820) BP: 130/59 mmHg (12/17 0820) Pulse Rate: 76 (12/17 0820)  Labs: No results for input(s): HGB, HCT, PLT, APTT, LABPROT, INR, HEPARINUNFRC, CREATININE, CKTOTAL, CKMB, TROPONINI in the last 72 hours.  Estimated Creatinine Clearance: 93.5 mL/min (by C-G formula based on Cr of 1.08).   Medical History: Past Medical History  Diagnosis Date  . COLONIC POLYPS, ADENOMATOUS 08/01/2008    09/01/2012 also  . DIABETES MELLITUS, TYPE I 03/13/2007  . HYPERCHOLESTEROLEMIA 02/03/2008  . ANEMIA 08/15/2009  . Anxiety state, unspecified 12/15/2008  . DEPRESSION 03/13/2007  . HYPERTENSION 03/13/2007  . PULMONARY NODULE 11/24/2008  . GERD 03/13/2007  . TOBACCO ABUSE 11/24/2008  . EMPHYSEMA 11/08/2008  . Prostatism   . ED (erectile dysfunction)   . [name]'s esophagus   . Hiatal hernia   . Diverticulitis   . Arthritis   . COPD (chronic obstructive pulmonary disease) ([place])   . Shortness of breath dyspnea     WITH EXERTION     Medications:  Scheduled:  . antiseptic oral rinse  7 mL Mouth Rinse q12n4p  . chlorhexidine  15 mL Mouth Rinse BID  . feeding supplement (ENSURE ENLIVE)  237 mL Oral BID BM  . Gerhardt's butt cream   Topical BID  . Influenza vac split quadrivalent PF  0.5 mL Intramuscular Tomorrow-1000  . insulin aspart  0-24 Units Subcutaneous 6 times per day  . insulin detemir  15 Units Subcutaneous BID  . levalbuterol  0.63 mg Nebulization BID  . pantoprazole sodium  40 mg Per J Tube Q1200  . pneumococcal 23 valent vaccine  0.5 mL Intramuscular Tomorrow-1000  . rivaroxaban  15 mg Oral BID WC  . sodium chloride  10 mL Intravenous Q12H    Assessment: 69 YO male w/  esophageal cancer who is s/p esophagectomy on 12/6. Pt has had J-tube since surgery but started a soft diet 12/16 and is able to swallow pills per the PA, [name] [name]. Pharmacy is consulted to dose Xarelto for suspected PE.  Goal of Therapy:  Monitor platelets by anticoagulation protocol: Yes   Plan:  Start Xarelto 15 mg PO BID with meals x 21 days, then decrease to Xarelto 20 mg PO daily with meals Discontinue lovenox and give first dose of Xarelto 0-2 hours prior to when the next lovenox dose would have been due (2200 today). Monitor CBC, renal function, s/sx of bleeding  Joya San, PharmD Clinical Pharmacy Resident Pager # [phone] 08/05/2015 11:13 AM

## 2015-08-05 NOTE — Progress Notes (Addendum)
      [address]       [place] [address]             [phone]      11 Days Post-Op Procedure(s) (LRB): TRANSHIATAL TOTAL ESOPHAGECTOMY COMPLETE PYLOROMYOTOMY (N/A) FEEDING JEJUNOSTOMY (N/A) VIDEO BRONCHOSCOPY (N/A)   Subjective:  Ronald Johnson is Ronald Johnson Ronald Johnson wants to be discharged today.  Ronald Johnson states Ronald Johnson was told Ronald Johnson would only be here a week.  I explained to Ronald Johnson that his surgery has a long recovery process.  Ronald Johnson is tolerating a soft diet.... + BM  Objective: Vital signs in last 24 hours: Temp:  [97.4 F (36.3 C)-98.4 F (36.9 C)] 97.9 F (36.6 C) (12/17 0820) Pulse Rate:  [74-97] 76 (12/17 0820) Cardiac Rhythm:  [-] Normal sinus rhythm (12/17 0820) Resp:  [20-23] 20 (12/17 0820) BP: (112-132)/(58-82) 130/59 mmHg (12/17 0820) SpO2:  [94 %-99 %] 95 % (12/17 0820) Weight:  [262 lb 2 oz (118.9 kg)-263 lb 0.1 oz (119.3 kg)] 262 lb 2 oz (118.9 kg) (12/17 0407)  Intake/Output from previous day: 12/16 0701 - 12/17 0700 In: 2590 [P.O.:220; NG/GT:2310] Out: 375 [Urine:375]  General appearance: alert, cooperative and no distress Heart: regular rate and rhythm Lungs: clear to auscultation bilaterally Abdomen: soft, non-tender; bowel sounds normal; no masses,  no organomegaly Extremities: edema trace Wound: clean and dry  Lab Results: No results for input(s): WBC, HGB, HCT, PLT in the last 72 hours. BMET: No results for input(s): NA, K, CL, CO2, GLUCOSE, BUN, CREATININE, CALCIUM in the last 72 hours.  PT/INR: No results for input(s): LABPROT, INR in the last 72 hours. ABG    Component Value Date/Time   PHART 7.332* 07/26/2015 0438   HCO3 23.3 07/26/2015 0438   TCO2 24 07/26/2015 1701   ACIDBASEDEF 1.8 07/26/2015 0438   O2SAT 96.5 07/26/2015 0438   CBG (last 3)   Recent Labs  08/04/15 2328 08/05/15 0349 08/05/15 0816  GLUCAP 216* 238* 104*    Assessment/Plan: S/P Procedure(s) (LRB): TRANSHIATAL TOTAL ESOPHAGECTOMY COMPLETE PYLOROMYOTOMY  (N/A) FEEDING JEJUNOSTOMY (N/A) VIDEO BRONCHOSCOPY (N/A)  1. Tolerating soft diet, no N/V, abd distention 2. Tube- feeds tolerating at 70 ml/hr can run nightly at discharge 3. DM- continue current regimen 4. Dispo- Ronald Johnson stable, tolerating soft diet... Ronald Johnson wants to be discharged, will discuss with staff   LOS: 11 days    [name], [name] 08/05/2015  Ronald Johnson seen and examined, agree with above Ronald Johnson is anxious this AM Will change tube feeds to 7P to Needles. [name], MD [facility] [phone]

## 2015-08-06 LAB — GLUCOSE, CAPILLARY
Glucose-Capillary: 242 mg/dL — ABNORMAL HIGH (ref 65–99)
Glucose-Capillary: 265 mg/dL — ABNORMAL HIGH (ref 65–99)
Glucose-Capillary: 129 mg/dL — ABNORMAL HIGH (ref 65–99)

## 2015-08-06 MED ORDER — LEVALBUTEROL HCL 0.63 MG/3ML IN NEBU: 0.6300 mg | INHALATION_SOLUTION | Freq: Four times a day (QID) | RESPIRATORY_TRACT | Status: DC | PRN

## 2015-08-06 MED ORDER — RIVAROXABAN 15 MG PO TABS: 15.0000 mg | ORAL_TABLET | Freq: Two times a day (BID) | ORAL | Status: DC

## 2015-08-06 MED ORDER — VITAL AF 1.2 CAL PO LIQD: 1000.0000 mL | ORAL | Status: DC

## 2015-08-06 NOTE — Progress Notes (Signed)
RN entered patients room for the second time this evening and found patient had disconnected his tube feedings. RN attempted to re-connect and patient stated he does not want them and he is eating enough during the day.

## 2015-08-06 NOTE — Care Management Note (Signed)
Case Management Note  Patient Details  Name: Ronald Johnson MRN: [id number] Date of Birth: 10/20/1945  Subjective/Objective:                  Adenocarcinoma of the esophagogastric junction, Transhiatal total Esophagectomy Complete Pyloromyotomy, Feeding Jejunostomy  Action/Plan: CM spoke to patient at the bedside who said that he is more than ready to go home but that the doctor is saying that he needs to stay longer for healing. CM explained that patient would be going [facility] with [facility] RN for tube feedings and he chose [facility]. CM called Tiffany with [facility] to advise of patient going home with TF. CM will need clarification of order upon discharge planning to specify type, rate, time of TF including flushes. RN made aware.   Cm provided patient with free 30 day supply coupon for Xarelto.   Patient said that he would be going home with his wife and did not need additonal support at home and declined any DME other than the TF pump. Patient chose [facility] for TF pump and supplies.   CM will follow for additional needs that arise.   Expected Discharge Date:  08/06/15               Expected Discharge Plan:  [place]  In-House Referral:   Discharge planning Services  CM Consult, Medication Assistance  Post Acute Care Choice:  Durable Medical Equipment, Home Health Choice offered to:  Patient  DME Arranged:  Tube feeding, Tube feeding pump DME Agency:  [place]:  RN [facility] Agency:  [place]  Status of Service:  In process, will continue to follow  Medicare Important Message Given:  Yes Date Medicare IM Given:    Medicare IM give by:    Date Additional Medicare IM Given:    Additional Medicare Important Message give by:     If discussed at [place] of Stay Meetings, dates discussed:    Additional Comments:  Guido Sander, RN 08/06/2015, 9:27 AM

## 2015-08-06 NOTE — Discharge Instructions (Signed)
1. Please crush pills and take with applesause 2. Diet- soft, eat small frequent meals, avoid carbonated beverages 3. May wash incisions with soap and water, pat dry 4. No driving 5. Administer tube feeds at 69ml/hr from 7pm-7am 6. Flush J Tube every 8 hours with 73ml of NS  Contact our office if these symptoms develop [phone] -redness, drainage from surgical wounds -persistent nausea/vomiting -fever >101 -If J Tube appears clogged

## 2015-08-06 NOTE — Care Management Important Message (Signed)
Important Message  Patient Details  Name: Ronald Ronald MRN: [id number] Date of Birth: 1945/11/20   Medicare Important Message Given:  Yes    Guido Sander, RN 08/06/2015, 10:53 AM

## 2015-08-06 NOTE — Progress Notes (Signed)
Discharge instruction reviewed and all questions answered. Educations given regarding J-tube care as well. Stitch removed per MD order as well.

## 2015-08-07 DIAGNOSIS — J449 Chronic obstructive pulmonary disease, unspecified: Secondary | ICD-10-CM | POA: Diagnosis not present

## 2015-08-07 DIAGNOSIS — D638 Anemia in other chronic diseases classified elsewhere: Secondary | ICD-10-CM | POA: Diagnosis not present

## 2015-08-07 DIAGNOSIS — F341 Dysthymic disorder: Secondary | ICD-10-CM | POA: Diagnosis not present

## 2015-08-07 DIAGNOSIS — I10 Essential (primary) hypertension: Secondary | ICD-10-CM | POA: Diagnosis not present

## 2015-08-07 DIAGNOSIS — E109 Type 1 diabetes mellitus without complications: Secondary | ICD-10-CM | POA: Diagnosis not present

## 2015-08-07 DIAGNOSIS — Z87891 Personal history of nicotine dependence: Secondary | ICD-10-CM | POA: Diagnosis not present

## 2015-08-07 DIAGNOSIS — Z7901 Long term (current) use of anticoagulants: Secondary | ICD-10-CM | POA: Diagnosis not present

## 2015-08-07 DIAGNOSIS — C160 Malignant neoplasm of cardia: Secondary | ICD-10-CM | POA: Diagnosis not present

## 2015-08-07 DIAGNOSIS — C155 Malignant neoplasm of lower third of esophagus: Secondary | ICD-10-CM | POA: Diagnosis not present

## 2015-08-07 DIAGNOSIS — K219 Gastro-esophageal reflux disease without esophagitis: Secondary | ICD-10-CM | POA: Diagnosis not present

## 2015-08-07 DIAGNOSIS — Z434 Encounter for attention to other artificial openings of digestive tract: Secondary | ICD-10-CM | POA: Diagnosis not present

## 2015-08-10 DIAGNOSIS — E109 Type 1 diabetes mellitus without complications: Secondary | ICD-10-CM | POA: Diagnosis not present

## 2015-08-10 DIAGNOSIS — K219 Gastro-esophageal reflux disease without esophagitis: Secondary | ICD-10-CM | POA: Diagnosis not present

## 2015-08-10 DIAGNOSIS — Z434 Encounter for attention to other artificial openings of digestive tract: Secondary | ICD-10-CM | POA: Diagnosis not present

## 2015-08-10 DIAGNOSIS — C155 Malignant neoplasm of lower third of esophagus: Secondary | ICD-10-CM | POA: Diagnosis not present

## 2015-08-10 DIAGNOSIS — J449 Chronic obstructive pulmonary disease, unspecified: Secondary | ICD-10-CM | POA: Diagnosis not present

## 2015-08-10 DIAGNOSIS — C160 Malignant neoplasm of cardia: Secondary | ICD-10-CM | POA: Diagnosis not present

## 2015-08-15 ENCOUNTER — Ambulatory Visit: Payer: Medicare Other | Admitting: Internal Medicine

## 2015-08-15 DIAGNOSIS — C160 Malignant neoplasm of cardia: Secondary | ICD-10-CM | POA: Diagnosis not present

## 2015-08-15 DIAGNOSIS — C155 Malignant neoplasm of lower third of esophagus: Secondary | ICD-10-CM | POA: Diagnosis not present

## 2015-08-15 DIAGNOSIS — Z434 Encounter for attention to other artificial openings of digestive tract: Secondary | ICD-10-CM | POA: Diagnosis not present

## 2015-08-15 DIAGNOSIS — E109 Type 1 diabetes mellitus without complications: Secondary | ICD-10-CM | POA: Diagnosis not present

## 2015-08-15 DIAGNOSIS — K219 Gastro-esophageal reflux disease without esophagitis: Secondary | ICD-10-CM | POA: Diagnosis not present

## 2015-08-15 DIAGNOSIS — J449 Chronic obstructive pulmonary disease, unspecified: Secondary | ICD-10-CM | POA: Diagnosis not present

## 2015-08-17 DIAGNOSIS — C155 Malignant neoplasm of lower third of esophagus: Secondary | ICD-10-CM | POA: Diagnosis not present

## 2015-08-17 DIAGNOSIS — K219 Gastro-esophageal reflux disease without esophagitis: Secondary | ICD-10-CM | POA: Diagnosis not present

## 2015-08-17 DIAGNOSIS — J449 Chronic obstructive pulmonary disease, unspecified: Secondary | ICD-10-CM | POA: Diagnosis not present

## 2015-08-17 DIAGNOSIS — C160 Malignant neoplasm of cardia: Secondary | ICD-10-CM | POA: Diagnosis not present

## 2015-08-17 DIAGNOSIS — E109 Type 1 diabetes mellitus without complications: Secondary | ICD-10-CM | POA: Diagnosis not present

## 2015-08-17 DIAGNOSIS — Z434 Encounter for attention to other artificial openings of digestive tract: Secondary | ICD-10-CM | POA: Diagnosis not present

## 2015-08-18 ENCOUNTER — Encounter (HOSPITAL_COMMUNITY): Payer: Self-pay | Admitting: *Deleted

## 2015-08-18 ENCOUNTER — Emergency Department (HOSPITAL_COMMUNITY): Payer: Medicare Other

## 2015-08-18 ENCOUNTER — Inpatient Hospital Stay (HOSPITAL_COMMUNITY)
Admission: EM | Admit: 2015-08-18 | Discharge: 2015-08-22 | DRG: 378 | Disposition: A | Discharge status: Home / Self Care | Payer: Medicare Other | Attending: Internal Medicine | Admitting: Internal Medicine

## 2015-08-18 DIAGNOSIS — R0602 Shortness of breath: Secondary | ICD-10-CM

## 2015-08-18 DIAGNOSIS — Z934 Other artificial openings of gastrointestinal tract status: Secondary | ICD-10-CM | POA: Diagnosis not present

## 2015-08-18 DIAGNOSIS — E872 Acidosis: Secondary | ICD-10-CM | POA: Diagnosis present

## 2015-08-18 DIAGNOSIS — D649 Anemia, unspecified: Secondary | ICD-10-CM | POA: Diagnosis not present

## 2015-08-18 DIAGNOSIS — R7989 Other specified abnormal findings of blood chemistry: Secondary | ICD-10-CM | POA: Insufficient documentation

## 2015-08-18 DIAGNOSIS — F418 Other specified anxiety disorders: Secondary | ICD-10-CM | POA: Diagnosis present

## 2015-08-18 DIAGNOSIS — Z9221 Personal history of antineoplastic chemotherapy: Secondary | ICD-10-CM

## 2015-08-18 DIAGNOSIS — K9189 Other postprocedural complications and disorders of digestive system: Secondary | ICD-10-CM | POA: Diagnosis not present

## 2015-08-18 DIAGNOSIS — D62 Acute posthemorrhagic anemia: Secondary | ICD-10-CM | POA: Diagnosis present

## 2015-08-18 DIAGNOSIS — C159 Malignant neoplasm of esophagus, unspecified: Secondary | ICD-10-CM | POA: Diagnosis present

## 2015-08-18 DIAGNOSIS — Z7901 Long term (current) use of anticoagulants: Secondary | ICD-10-CM

## 2015-08-18 DIAGNOSIS — N183 Chronic kidney disease, stage 3 (moderate): Secondary | ICD-10-CM

## 2015-08-18 DIAGNOSIS — I5032 Chronic diastolic (congestive) heart failure: Secondary | ICD-10-CM | POA: Diagnosis not present

## 2015-08-18 DIAGNOSIS — I2510 Atherosclerotic heart disease of native coronary artery without angina pectoris: Secondary | ICD-10-CM | POA: Diagnosis not present

## 2015-08-18 DIAGNOSIS — Z87891 Personal history of nicotine dependence: Secondary | ICD-10-CM

## 2015-08-18 DIAGNOSIS — R531 Weakness: Secondary | ICD-10-CM

## 2015-08-18 DIAGNOSIS — I130 Hypertensive heart and chronic kidney disease with heart failure and stage 1 through stage 4 chronic kidney disease, or unspecified chronic kidney disease: Secondary | ICD-10-CM | POA: Diagnosis present

## 2015-08-18 DIAGNOSIS — M545 Low back pain: Secondary | ICD-10-CM | POA: Diagnosis present

## 2015-08-18 DIAGNOSIS — Z923 Personal history of irradiation: Secondary | ICD-10-CM

## 2015-08-18 DIAGNOSIS — J449 Chronic obstructive pulmonary disease, unspecified: Secondary | ICD-10-CM | POA: Diagnosis present

## 2015-08-18 DIAGNOSIS — D539 Nutritional anemia, unspecified: Secondary | ICD-10-CM | POA: Insufficient documentation

## 2015-08-18 DIAGNOSIS — Z8501 Personal history of malignant neoplasm of esophagus: Secondary | ICD-10-CM

## 2015-08-18 DIAGNOSIS — R778 Other specified abnormalities of plasma proteins: Secondary | ICD-10-CM | POA: Insufficient documentation

## 2015-08-18 DIAGNOSIS — IMO0002 Reserved for concepts with insufficient information to code with codable children: Secondary | ICD-10-CM | POA: Insufficient documentation

## 2015-08-18 DIAGNOSIS — I10 Essential (primary) hypertension: Secondary | ICD-10-CM | POA: Diagnosis present

## 2015-08-18 DIAGNOSIS — K921 Melena: Principal | ICD-10-CM | POA: Insufficient documentation

## 2015-08-18 DIAGNOSIS — I248 Other forms of acute ischemic heart disease: Secondary | ICD-10-CM | POA: Diagnosis present

## 2015-08-18 DIAGNOSIS — E0865 Diabetes mellitus due to underlying condition with hyperglycemia: Secondary | ICD-10-CM

## 2015-08-18 DIAGNOSIS — Z9289 Personal history of other medical treatment: Secondary | ICD-10-CM

## 2015-08-18 DIAGNOSIS — Z85028 Personal history of other malignant neoplasm of stomach: Secondary | ICD-10-CM

## 2015-08-18 DIAGNOSIS — Z9049 Acquired absence of other specified parts of digestive tract: Secondary | ICD-10-CM | POA: Diagnosis not present

## 2015-08-18 DIAGNOSIS — Z86711 Personal history of pulmonary embolism: Secondary | ICD-10-CM | POA: Insufficient documentation

## 2015-08-18 DIAGNOSIS — K219 Gastro-esophageal reflux disease without esophagitis: Secondary | ICD-10-CM | POA: Diagnosis present

## 2015-08-18 DIAGNOSIS — E785 Hyperlipidemia, unspecified: Secondary | ICD-10-CM | POA: Diagnosis present

## 2015-08-18 DIAGNOSIS — I2699 Other pulmonary embolism without acute cor pulmonale: Secondary | ICD-10-CM

## 2015-08-18 DIAGNOSIS — R58 Hemorrhage, not elsewhere classified: Secondary | ICD-10-CM

## 2015-08-18 DIAGNOSIS — E0859 Diabetes mellitus due to underlying condition with other circulatory complications: Secondary | ICD-10-CM | POA: Insufficient documentation

## 2015-08-18 DIAGNOSIS — K922 Gastrointestinal hemorrhage, unspecified: Secondary | ICD-10-CM | POA: Diagnosis present

## 2015-08-18 DIAGNOSIS — E1022 Type 1 diabetes mellitus with diabetic chronic kidney disease: Secondary | ICD-10-CM | POA: Diagnosis present

## 2015-08-18 DIAGNOSIS — Z794 Long term (current) use of insulin: Secondary | ICD-10-CM | POA: Diagnosis not present

## 2015-08-18 HISTORY — DX: Anxiety disorder, unspecified: F41.9

## 2015-08-18 HISTORY — DX: Depression, unspecified: F32.A

## 2015-08-18 HISTORY — DX: Gastrointestinal hemorrhage, unspecified: K92.2

## 2015-08-18 HISTORY — DX: Malignant neoplasm of stomach, unspecified: C16.9

## 2015-08-18 HISTORY — DX: Malignant neoplasm of esophagus, unspecified: C15.9

## 2015-08-18 HISTORY — DX: Personal history of other medical treatment: Z92.89

## 2015-08-18 HISTORY — DX: Major depressive disorder, single episode, unspecified: F32.9

## 2015-08-18 HISTORY — DX: Anemia, unspecified: D64.9

## 2015-08-18 LAB — I-STAT ARTERIAL BLOOD GAS, ED
ACID-BASE EXCESS: 3 mmol/L — AB (ref 0.0–2.0)
BICARBONATE: 26.6 meq/L — AB (ref 20.0–24.0)
O2 SAT: 97 %
PO2 ART: 84 mmHg (ref 80.0–100.0)
Patient temperature: 98.6
TCO2: 28 mmol/L (ref 0–100)
pCO2 arterial: 33.3 mmHg — ABNORMAL LOW (ref 35.0–45.0)
pH, Arterial: 7.51 — ABNORMAL HIGH (ref 7.350–7.450)

## 2015-08-18 LAB — COMPREHENSIVE METABOLIC PANEL
ALBUMIN: 2.7 g/dL — AB (ref 3.5–5.0)
ALK PHOS: 81 U/L (ref 38–126)
ALT: 15 U/L — AB (ref 17–63)
AST: 19 U/L (ref 15–41)
Anion gap: 10 (ref 5–15)
BILIRUBIN TOTAL: 0.2 mg/dL — AB (ref 0.3–1.2)
BUN: 42 mg/dL — ABNORMAL HIGH (ref 6–20)
CALCIUM: 8.7 mg/dL — AB (ref 8.9–10.3)
CO2: 26 mmol/L (ref 22–32)
CREATININE: 1.3 mg/dL — AB (ref 0.61–1.24)
Chloride: 103 mmol/L (ref 101–111)
GFR calc Af Amer: >60 mL/min (ref >60)
GFR calc non Af Amer: 55 mL/min — ABNORMAL LOW (ref >60)
GLUCOSE: 237 mg/dL — AB (ref 65–99)
Potassium: 4.5 mmol/L (ref 3.5–5.1)
SODIUM: 139 mmol/L (ref 135–145)
Total Protein: 5.9 g/dL — ABNORMAL LOW (ref 6.5–8.1)

## 2015-08-18 LAB — POC OCCULT BLOOD, ED: FECAL OCCULT BLD: POSITIVE — AB

## 2015-08-18 LAB — CBC WITH DIFFERENTIAL/PLATELET
BASOS PCT: 0 %
Basophils Absolute: 0 10*3/uL (ref 0.0–0.1)
EOS ABS: 0.1 10*3/uL (ref 0.0–0.7)
EOS PCT: 2 %
HCT: 14.1 % — ABNORMAL LOW (ref 39.0–52.0)
Hemoglobin: 4.4 g/dL — CL (ref 13.0–17.0)
Lymphocytes Relative: 13 %
Lymphs Abs: 1.1 10*3/uL (ref 0.7–4.0)
MCH: 31 pg (ref 26.0–34.0)
MCHC: 31.2 g/dL (ref 30.0–36.0)
MCV: 99.3 fL (ref 78.0–100.0)
MONO ABS: 0.3 10*3/uL (ref 0.1–1.0)
MONOS PCT: 4 %
Neutro Abs: 6.9 10*3/uL (ref 1.7–7.7)
Neutrophils Relative %: 81 %
PLATELETS: 197 10*3/uL (ref 150–400)
RBC: 1.42 MIL/uL — AB (ref 4.22–5.81)
RDW: 15.6 % — AB (ref 11.5–15.5)
WBC: 8.5 10*3/uL (ref 4.0–10.5)

## 2015-08-18 LAB — URINE MICROSCOPIC-ADD ON: RBC / HPF: NONE SEEN RBC/hpf (ref 0–5)

## 2015-08-18 LAB — I-STAT CG4 LACTIC ACID, ED: Lactic Acid, Venous: 2.75 mmol/L (ref 0.5–2.0)

## 2015-08-18 LAB — URINALYSIS, ROUTINE W REFLEX MICROSCOPIC
Bilirubin Urine: NEGATIVE
GLUCOSE, UA: NEGATIVE mg/dL
HGB URINE DIPSTICK: NEGATIVE
KETONES UR: NEGATIVE mg/dL
Nitrite: NEGATIVE
PROTEIN: NEGATIVE mg/dL
Specific Gravity, Urine: 1.018 (ref 1.005–1.030)
pH: 6.5 (ref 5.0–8.0)

## 2015-08-18 LAB — CBG MONITORING, ED: GLUCOSE-CAPILLARY: 218 mg/dL — AB (ref 65–99)

## 2015-08-18 LAB — MRSA PCR SCREENING: MRSA by PCR: NEGATIVE

## 2015-08-18 LAB — GLUCOSE, CAPILLARY: Glucose-Capillary: 95 mg/dL (ref 65–99)

## 2015-08-18 LAB — PREPARE RBC (CROSSMATCH)

## 2015-08-18 LAB — PROTIME-INR
INR: 1.39 (ref 0.00–1.49)
Prothrombin Time: 17.2 seconds — ABNORMAL HIGH (ref 11.6–15.2)

## 2015-08-18 MED ORDER — ONDANSETRON HCL 4 MG/2ML IJ SOLN: 4.0000 mg | Freq: Four times a day (QID) | INTRAMUSCULAR | Status: DC | PRN

## 2015-08-18 MED ORDER — PIPERACILLIN-TAZOBACTAM 3.375 G IVPB
3.3750 g | Freq: Three times a day (TID) | INTRAVENOUS | Status: DC
  Filled 2015-08-18 (×2): qty 50

## 2015-08-18 MED ORDER — INSULIN GLARGINE 100 UNIT/ML ~~LOC~~ SOLN
7.0000 [IU] | Freq: Every day | SUBCUTANEOUS | Status: DC
  Filled 2015-08-18 (×2): qty 0.07

## 2015-08-18 MED ORDER — VANCOMYCIN HCL IN DEXTROSE 1-5 GM/200ML-% IV SOLN
1000.0000 mg | Freq: Two times a day (BID) | INTRAVENOUS | Status: DC
  Filled 2015-08-18 (×2): qty 200

## 2015-08-18 MED ORDER — SODIUM CHLORIDE 0.9 % IV SOLN: INTRAVENOUS | Status: DC

## 2015-08-18 MED ORDER — VANCOMYCIN HCL IN DEXTROSE 1-5 GM/200ML-% IV SOLN: 1000.0000 mg | Freq: Once | INTRAVENOUS | Status: DC

## 2015-08-18 MED ORDER — ACETAMINOPHEN 650 MG RE SUPP
650.0000 mg | Freq: Four times a day (QID) | RECTAL | Status: DC | PRN
  Administered 2015-08-19: 650 mg via RECTAL
  Filled 2015-08-18: qty 1

## 2015-08-18 MED ORDER — MORPHINE SULFATE (PF) 2 MG/ML IV SOLN
1.0000 mg | INTRAVENOUS | Status: DC | PRN
  Administered 2015-08-18 – 2015-08-19 (×3): 1 mg via INTRAVENOUS
  Filled 2015-08-18 (×3): qty 1

## 2015-08-18 MED ORDER — SODIUM CHLORIDE 0.9 % IV SOLN
8.0000 mg/h | INTRAVENOUS | Status: DC
  Administered 2015-08-18 – 2015-08-20 (×5): 8 mg/h via INTRAVENOUS
  Filled 2015-08-18 (×14): qty 80

## 2015-08-18 MED ORDER — SODIUM CHLORIDE 0.9 % IV BOLUS (SEPSIS)
1000.0000 mL | INTRAVENOUS | Status: DC
  Administered 2015-08-18: 1000 mL via INTRAVENOUS

## 2015-08-18 MED ORDER — ACETAMINOPHEN 325 MG PO TABS
650.0000 mg | ORAL_TABLET | Freq: Four times a day (QID) | ORAL | Status: DC | PRN
  Administered 2015-08-18 – 2015-08-20 (×2): 650 mg via ORAL
  Filled 2015-08-18 (×2): qty 2

## 2015-08-18 MED ORDER — ONDANSETRON HCL 4 MG PO TABS: 4.0000 mg | ORAL_TABLET | Freq: Four times a day (QID) | ORAL | Status: DC | PRN

## 2015-08-18 MED ORDER — PANTOPRAZOLE SODIUM 40 MG IV SOLR
40.0000 mg | Freq: Two times a day (BID) | INTRAVENOUS | Status: DC
  Administered 2015-08-21: 40 mg via INTRAVENOUS
  Filled 2015-08-18 (×2): qty 40

## 2015-08-18 MED ORDER — PIPERACILLIN-TAZOBACTAM 3.375 G IVPB 30 MIN
3.3750 g | Freq: Once | INTRAVENOUS | Status: AC
Start: 2015-08-18 — End: 2015-08-18
  Administered 2015-08-18: 3.375 g via INTRAVENOUS
  Filled 2015-08-18: qty 50

## 2015-08-18 MED ORDER — SODIUM CHLORIDE 0.9 % IV SOLN
INTRAVENOUS | Status: AC
  Administered 2015-08-18: 75 mL/h via INTRAVENOUS

## 2015-08-18 MED ORDER — MORPHINE SULFATE (PF) 2 MG/ML IV SOLN: 1.0000 mg | INTRAVENOUS | Status: DC | PRN

## 2015-08-18 MED ORDER — INSULIN ASPART 100 UNIT/ML ~~LOC~~ SOLN
0.0000 [IU] | Freq: Three times a day (TID) | SUBCUTANEOUS | Status: DC
  Administered 2015-08-20: 5 [IU] via SUBCUTANEOUS
  Administered 2015-08-20 (×2): 2 [IU] via SUBCUTANEOUS

## 2015-08-18 MED ORDER — SODIUM CHLORIDE 0.9 % IV SOLN
80.0000 mg | Freq: Once | INTRAVENOUS | Status: DC
  Filled 2015-08-18: qty 80

## 2015-08-18 MED ORDER — LORAZEPAM 2 MG/ML IJ SOLN
0.5000 mg | Freq: Three times a day (TID) | INTRAMUSCULAR | Status: DC | PRN
  Administered 2015-08-20 – 2015-08-21 (×3): 0.5 mg via INTRAVENOUS
  Filled 2015-08-18 (×3): qty 1

## 2015-08-18 MED ORDER — VANCOMYCIN HCL 10 G IV SOLR
2500.0000 mg | Freq: Once | INTRAVENOUS | Status: AC
  Administered 2015-08-18: 2500 mg via INTRAVENOUS
  Filled 2015-08-18: qty 2500

## 2015-08-18 NOTE — ED Notes (Signed)
Attempted report x1. 

## 2015-08-18 NOTE — H&P (Signed)
[facility] History and Physical  Ronald Johnson [id number] DOB: 1946-08-11 DOA: 08/18/2015  Referring physician: Dr. Wyvonnia Dusky PCP: [name], MD   Chief Complaint: Fatigue, weakness, shortness of breath with activity  HPI: Ronald Johnson is a 69 y.o. male with a past medical history significant for hypertension, adenocarcinoma of the esophagus (status post chemoradiation and resection), pulmonary embolism, diabetes on chronic insulin, chronic kidney disease stage 2-3 and GERD; who presented to the hospital secondary to increased weakness, fatigue tiredness sensation and bloody stools. Patient endorses some shortness of breath on activity and the family has reported some improvement in his stools. They express Marrone: Intermittently and since he was on anticoagulation for his PE they initially thought that it was expected. Patient become significantly weak and was having so much symptoms with activity that his wife decided to bring him to the emergency department for further evaluation and treatment.  In the ED he was found to be very pale, mildly tachycardic and with soft blood pressure. His lactic acid was mildly elevated has elevated BUN and his hemoglobin was found to be 4.4.  Melanotic stools on rectal exam was appreciated. [facility] was called to admit the patient for further evaluation and treatment.  Of note, he denies chest pain, fever, cough, abdominal pain, dysuria, nausea, vomiting or any other acute complaints.   Treatment in the ED: Patient had placement of 2 large Bores IV; received 2 units of PRBCs and also 2 L of fluid resuscitation. GI was consulted.  Review of Systems:  Negative  except as otherwise mentioned in history of present illness.   Past Medical History  Diagnosis Date  . COLONIC POLYPS, ADENOMATOUS 08/01/2008, 2013, 2014  . DIABETES MELLITUS, TYPE I 03/13/2007  . HYPERCHOLESTEROLEMIA 02/03/2008  . Anemia 08/15/2009  . Depression with anxiety  03/13/2007  . HYPERTENSION 03/13/2007  . PULMONARY NODULE 11/24/2008  . GERD 03/13/2007  . TOBACCO ABUSE 11/24/2008  . EMPHYSEMA 11/08/2008  . Prostatism   . ED (erectile dysfunction)   . [name]'s esophagus 2007  . Hiatal hernia   . Diverticulitis   . Arthritis   . COPD (chronic obstructive pulmonary disease) [facility])    Past Surgical History  Procedure Laterality Date  . Egd  11/19/2005  . Foot fracture surgery Right 1980    right foot w/ pins and screws  . Removal pins and screws foot  1980    right foot  . Upper gastrointestinal endoscopy    . Circumcision    . Colonoscopy    . Polypectomy    . Vasectomy    . Eus N/A 03/16/2015    Procedure: UPPER ENDOSCOPIC ULTRASOUND (EUS) RADIAL;  Surgeon: Milus Banister, MD;  Location: [facility];  Service: Endoscopy;  Laterality: N/A;  . Complete esophagectomy N/A 07/25/2015    Procedure: TRANSHIATAL TOTAL ESOPHAGECTOMY COMPLETE PYLOROMYOTOMY;  Surgeon: [name], MD;  Location: [place];  Service: Thoracic;  Laterality: N/A;  . Jejunostomy N/A 07/25/2015    Procedure: FEEDING JEJUNOSTOMY;  Surgeon: [name], MD;  Location: [place];  Service: Thoracic;  Laterality: N/A;  . Video bronchoscopy N/A 07/25/2015    Procedure: VIDEO BRONCHOSCOPY;  Surgeon: [name], MD;  Location: [place];  Service: Thoracic;  Laterality: N/A;   Social History:  reports that he quit smoking about 6 years ago. His smoking use included Cigarettes. He has a 35 pack-year smoking history. He has never used smokeless tobacco. He reports that he drinks alcohol. He reports that  he does not use illicit drugs.  No Known Allergies  Family History  Problem Relation Age of Onset  . Stroke Mother   . Diabetes Mother   . Colon cancer Neg Hx   . Diabetes Maternal Grandmother     mother side of the family aunts, MGF  . Breast cancer Paternal Grandmother     Prior to Admission medications   Medication Sig Start Date End Date Taking? Authorizing Provider    atorvastatin (LIPITOR) 10 MG tablet Take 1 tablet by mouth  every day Patient taking differently: Take 1 tablet by mouth  every evening 04/11/15  Yes [name], MD  esomeprazole (NEXIUM) 40 MG capsule Take 1 capsule (40 mg total) by mouth daily at 12 noon. 07/07/15  Yes Jessica D Zehr, PA-C  feeding supplement, ENSURE ENLIVE, (ENSURE ENLIVE) LIQD Take 237 mLs by mouth 2 (two) times daily between meals. 08/05/15  Yes [name] R [name], PA-C  HYDROcodone-acetaminophen (HYCET) 7.5-325 mg/15 ml solution Take 10 mLs by mouth every 4 (four) hours as needed for moderate pain. 08/05/15  Yes [name] R [name], PA-C  insulin NPH Human (HUMULIN N) 100 UNIT/ML injection Inject 300 units into the skin every morning. Patient taking differently: Inject 300 Units into the skin daily before breakfast. Inject 300 units into the skin every morning. 07/06/15  Yes [name], MD  losartan-hydrochlorothiazide (HYZAAR) 100-12.5 MG tablet Take 1 tablet by mouth daily. 06/26/15  Yes Historical Provider, MD  Nutritional Supplements (FEEDING SUPPLEMENT, VITAL AF 1.2 CAL,) LIQD Place 1,000 mLs into feeding tube continuous. At 70 ml/hr from 7pm -7am 08/05/15  Yes [name] R [name], PA-C  Rivaroxaban (XARELTO) 15 MG TABS tablet Take 1 tablet (15 mg total) by mouth 2 (two) times daily with a meal. 08/06/15  Yes [name] R [name], PA-C  venlafaxine XR (EFFEXOR-XR) 150 MG 24 hr capsule Take 150 mg by mouth daily with breakfast.   Yes Historical Provider, MD  diazepam (VALIUM) 5 MG tablet Take 1 tablet (5 mg total) by mouth 2 (two) times daily as needed. Patient taking differently: Take 5 mg by mouth 2 (two) times daily as needed for anxiety.  06/26/15   [name], MD  glucose blood (PRECISION XTRA TEST STRIPS) test strip Check blood sugar three times a day dx 250.01 06/17/14   [name], MD  Insulin Syringe-Needle U-100 30G X 3/8" 1 ML MISC Use to inject insulin 3 times daily. 06/06/15   [name], MD  sildenafil (REVATIO) 20 MG  tablet TAKE 1-5 TABLETS BY MOUTH ONCE DAILY AS NEEDED 02/07/15   [name], MD   Physical Exam: Filed Vitals:   08/18/15 1500 08/18/15 1530 08/18/15 1600 08/18/15 1615  BP: 117/61 124/62 123/47 127/79  Pulse: 102 101 101 104  Temp:      TempSrc:      Resp: 16 25  23   SpO2: 98% 100% 98% 100%    Wt Readings from Last 3 Encounters:  08/06/15 113.1 kg (249 lb 5.4 oz)  07/24/15 113.807 kg (250 lb 14.4 oz)  07/21/15 116.438 kg (256 lb 11.2 oz)    General:  Afebrile, denies chest pain; feeling weak, tired and fatigued. Patient's family reports some ongoing bloating his stools while at home and in the emergency department fecal occult blood test was positive and demonstrated some melanotic stools. No shortness of breath Eyes: PERRL, normal lids, irises & conjunctiva, no icterus, no nystagmus ENT: grossly normal hearing, no exudates, no thrush or erythema inside his mouth; there is  no drainage out of his ears or nostrils. Neck: no LAD, masses or thyromegaly, no bruits, no JVD Cardiovascular: On exam regular rate, S1 and S2 appreciated; no rubs, no gallops, no murmurs Telemetry: SR, no arrhythmias appreciated on EKG and telemetry box in the emergency department Respiratory: CTA bilaterally, no w/r/r. Normal respiratory effort and no use of accessory muscles Abdomen: soft, nontender, nondistended, positive bowel sounds. Jejunostomy tube in place and no signs of infection at entrance point. Skin: no rash or induration seen on exam; patient with good healing mid sternal scar Musculoskeletal: grossly normal tone BUE/BLE Psychiatric: grossly normal mood and affect, speech fluent and appropriate Neurologic: grossly non-focal.          Labs on Admission:  Basic Metabolic Panel:  Recent Labs Lab 08/18/15 1215  NA 139  K 4.5  CL 103  CO2 26  GLUCOSE 237*  BUN 42*  CREATININE 1.30*  CALCIUM 8.7*   Liver Function Tests:  Recent Labs Lab 08/18/15 1215  AST 19  ALT 15*  ALKPHOS 81    BILITOT 0.2*  PROT 5.9*  ALBUMIN 2.7*   CBC:  Recent Labs Lab 08/18/15 1215  WBC 8.5  NEUTROABS 6.9  HGB 4.4*  HCT 14.1*  MCV 99.3  PLT 197   CBG:  Recent Labs Lab 08/18/15 1214  GLUCAP 218*    Radiological Exams on Admission: Dg Chest 1 View  08/18/2015  CLINICAL DATA:  Gastrointestinal bleeding. EXAM: CHEST 1 VIEW COMPARISON:  August 02, 2015. FINDINGS: The heart size and mediastinal contours are within normal limits. Both lungs are clear. No pneumothorax or pleural effusion is noted. The visualized skeletal structures are unremarkable. IMPRESSION: No acute cardiopulmonary abnormality seen. Electronically Signed   By: Marijo Conception, M.D.   On: 08/18/2015 14:12   Dg Abd 1 View  08/18/2015  CLINICAL DATA:  GI bleed. Recent esophagectomy on 07/25/2015 for esophageal cancer. Fatigue and weakness. EXAM: ABDOMEN - 1 VIEW COMPARISON:  CT scan dated 07/20/2015 FINDINGS: There is large bore jejunostomy tube in the mid abdomen. Minimal air in nondistended bowel. No visible free air or free fluid. No acute osseous abnormality. IMPRESSION: No acute abnormalities. Jejunostomy tube in place with the tip in the mid right abdomen. Electronically Signed   By: Lorriane Shire M.D.   On: 08/18/2015 14:14    EKG:  Sinus tachycardia, LVH; some non specific ST depression on lateral leads  Assessment/Plan 1-gastrointestinal bleeding with melena: Concern for upper GI bleed -Patient will be admitted to step down -requested 2 large-bore IVs -Started on PPI drip -2 units of blocks will be transfused -Patient was initially mild hypertensive; but improved after 2 L of IV fluids given -Will keep him NPO, hold anticoagulation therapy and follow hemoglobin trend -GI has been consulted and is planning for endoscopy in am    2-Symptomatic anemia: acute blood loss on chronic anemia -Will start transfusion with 2 units of PRBC's -follow Hgb trend and transfuse further as needed; goal is for Hgb  in the 8 range -on admission hemoglobin of 4.4  -will follow trend; most likely will require 1 or 2 more units   3-Depression with anxiety: PO meds on hold for now -will use PRN ativan  4-Essential hypertension: -stable currently -given acute GIB will hold on antihypertensive regimen   5-Type 1 diabetes mellitus with renal manifestations ([place]) -will hold home NPH -will start lantus 7 units and SSI -follow CBG's q6H while NPO -will check A1C  6-Esophageal cancer [facility]): s/p resection;  healing well and doing ok -will have cardiothoracic surgery service to check on him  7-Pulmonary embolism [facility]): will discontinue anticoagulation currently due to active bleeding Patient has been on medications for almost a whole month; no CP, no SOB and good O2 sat on RA   8-HLD (hyperlipidemia): will hold statins while NPO  9-mild lactic acidosis: due to anemia and decrease perfusion  -no signs of infection on CXR or UA -will discontinue antibiotics  10-CKD stage 2-3: Cr essentially at baseline -will follow trend -avoid sustained hypotension -holding nephrotoxic agents   11-abnormal EKG: lateral leads with some ST depression -no CP  -will check troponin and repeat EKG in am -could be associated with severe anemia   Cardiothoracic surgery (Dr. Prescott Gum) Gastroenterology service (Dr. Havery Moros)  Code Status: Full code DVT Prophylaxis: SCDs (patient actively bleeding) Family Communication: Wife and son at bedside Disposition Plan: Inpatient; stepdown; LOS > 2 midnights   Time spent: 32 minutes   [place], [facility] Pager [phone]

## 2015-08-18 NOTE — ED Notes (Signed)
Informed by Dr. Wyvonnia Dusky that the hospitalist is requesting the blood to transfuse over 3 hours per unit.  Rate changed.

## 2015-08-18 NOTE — ED Notes (Signed)
Pt arrives from home via [facility]. Pt had surgery and was released last Sunday after having part of his esophagus and stomach removed rt carcinoma, borders were not clear. Upon arrival pt has c/o generalized weakness/fatigue and SOB, pt is very pale. Pt states he took 200 U of [place] today.

## 2015-08-18 NOTE — Consult Note (Signed)
[facility] Consult: 3:22 PM 08/18/2015  LOS: 0 days    Referring Provider: ED MD  Primary Care Physician:  [name], MD Primary Gastroenterologist:  Dr. Henrene Pastor    Reason for Consultation:  Melena and Hgb of 4.4.   HPI: Ronald Johnson is a 68 y.o. male.  Type I diabetic with history of colon polyps, emphysema/COPD. Previous colonoscopy in 2009, 2013 and 2014 revealed tubular adenomas.   History of [name]'s esophagus, biopsy proven 2007. Patient had surveillance EGD 08/2011 with biopsy showing only reflux associated injury but no confirmed [name]'s. Developed dysphagia and epigastric discomfort in spring 2016. Cardiac stress test negative for ischemia.  EGD 03/08/15 by Dr. Henrene Pastor revealed ulcerated, nodular appearing distal esophagus.  Biopsies confirmed adenocarcinoma. 03/16/15 EUS:4cm long, 3.7cm across eccentric (non-circumferential) uT3N1 (Stage IIIA) GE junction adenocarinoma. The bulk of the tumor lays on the gastric side of the GE junction.  Diagnosed with adenocarcinoma of the distal esophagus/GE junction Patient underwent adjuvant chemoradiation.  CT scan of 07/20/15 confirmed a left-sided PE. Patient is on Eliquis as a result. Also incidentally it revealed three-vessel coronary atherosclerosis. There was no evidence of metastatic disease in the chest, abdomen or pelvis. Patient underwent 07/25/15 bronchoscopy, transhiatal total esophagectomy with cervical esophagogastrostomy, pyloromyotomy, and placement of jejunal feeding tube by Dr Servando Snare. His hemoglobin was 8.1-8.4 during the hospitalization with MCV in the low 100s.. Platelets into the low 100s during hospitalization. Patient discharged from hospital 08/06/15. Doing well at home, walking outdooors and increasing energy level.  Eating soft diet  and taking 2 to 3 cans of right total AF 1.2-calorie formula daily.  Last 2 days having some heartburn, anorexia.  No nausea.  Stool x 1 yesterday and x 1 today were maroon.  He felt tired, "disoriented" (difficulty concentrating.  No chest pain, no SOB, some dry cough (stable).  Some sternal/upper abdominal pain pain with cough.   Patient transported by EMS to the emergency room today.  On  Ed providers rectal exam today, stool is "melenic". His hemoglobin is 4.4, platelets are in the 190s. White count is normal.. Renal parameters are worse with BUN is 42, creatinine 1.3 compared with low 30s and 1.08 seventeen days ago.  Plain films of the chest and abdomen show no abnormalities. Feeding jejunostomy is in good position. Due to hypotension, code sepsis initiated so got doses of abx but on getting full hx, diagnosis is that of acute GIB.    Patient hasn't been using any NSAIDs,  he is on daily Nexium. No aspirin products.  No ETOH.   Staff just on the first of 2 ordered packed red blood cells.    Past Medical History  Diagnosis Date  . COLONIC POLYPS, ADENOMATOUS 08/01/2008, 2013, 2014  . DIABETES MELLITUS, TYPE I 03/13/2007  . HYPERCHOLESTEROLEMIA 02/03/2008  . Anemia 08/15/2009  . Depression with anxiety 03/13/2007  . HYPERTENSION 03/13/2007  . PULMONARY NODULE 11/24/2008  . GERD 03/13/2007  . TOBACCO ABUSE 11/24/2008  . EMPHYSEMA 11/08/2008  . Prostatism   . ED (erectile dysfunction)   . [name]'s  esophagus 2007  . Hiatal hernia   . Diverticulitis   . Arthritis   . COPD (chronic obstructive pulmonary disease) [facility])     Past Surgical History  Procedure Laterality Date  . Egd  11/19/2005  . Foot fracture surgery Right 1980    right foot w/ pins and screws  . Removal pins and screws foot  1980    right foot  . Upper gastrointestinal endoscopy    . Circumcision    . Colonoscopy    . Polypectomy    . Vasectomy    . Eus N/A 03/16/2015    Procedure: UPPER ENDOSCOPIC ULTRASOUND (EUS)  RADIAL;  Surgeon: Milus Banister, MD;  Location: [facility];  Service: Endoscopy;  Laterality: N/A;  . Complete esophagectomy N/A 07/25/2015    Procedure: TRANSHIATAL TOTAL ESOPHAGECTOMY COMPLETE PYLOROMYOTOMY;  Surgeon: [name], MD;  Location: [place];  Service: Thoracic;  Laterality: N/A;  . Jejunostomy N/A 07/25/2015    Procedure: FEEDING JEJUNOSTOMY;  Surgeon: [name], MD;  Location: [place];  Service: Thoracic;  Laterality: N/A;  . Video bronchoscopy N/A 07/25/2015    Procedure: VIDEO BRONCHOSCOPY;  Surgeon: [name], MD;  Location: [place];  Service: Thoracic;  Laterality: N/A;    Prior to Admission medications   Medication Sig Start Date End Date Taking? Authorizing Provider  atorvastatin (LIPITOR) 10 MG tablet Take 1 tablet by mouth  every day 04/11/15   [name], MD  diazepam (VALIUM) 5 MG tablet Take 1 tablet (5 mg total) by mouth 2 (two) times daily as needed. 06/26/15   [name], MD  esomeprazole (NEXIUM) 40 MG capsule Take 1 capsule (40 mg total) by mouth daily at 12 noon. 07/07/15   Laban Emperor Zehr, PA-C  feeding supplement, ENSURE ENLIVE, (ENSURE ENLIVE) LIQD Take 237 mLs by mouth 2 (two) times daily between meals. 08/05/15   [name] R [name], PA-C  glucose blood (PRECISION XTRA TEST STRIPS) test strip Check blood sugar three times a day dx 250.01 06/17/14   [name], MD  HYDROcodone-acetaminophen (HYCET) 7.5-325 mg/15 ml solution Take 10 mLs by mouth every 4 (four) hours as needed for moderate pain. 08/05/15   [name] R [name], PA-C  insulin NPH Human (HUMULIN N) 100 UNIT/ML injection Inject 300 units into the skin every morning. 07/06/15   [name], MD  Insulin Syringe-Needle U-100 30G X 3/8" 1 ML MISC Use to inject insulin 3 times daily. 06/06/15   [name], MD  Nutritional Supplements (FEEDING SUPPLEMENT, VITAL AF 1.2 CAL,) LIQD Place 1,000 mLs into feeding tube continuous. At 70 ml/hr from 7pm -7am 08/05/15   [name] R [name], PA-C    Nutritional Supplements (FEEDING SUPPLEMENT, VITAL AF 1.2 CAL,) LIQD Place 1,000 mLs into feeding tube continuous. 08/06/15   [name] R [name], PA-C  Rivaroxaban (XARELTO) 15 MG TABS tablet Take 1 tablet (15 mg total) by mouth 2 (two) times daily with a meal. 08/06/15   [name] R [name], PA-C  sildenafil (REVATIO) 20 MG tablet TAKE 1-5 TABLETS BY MOUTH ONCE DAILY AS NEEDED 02/07/15   [name], MD  venlafaxine XR (EFFEXOR-XR) 150 MG 24 hr capsule Take 150 mg by mouth daily with breakfast.    Historical Provider, MD    Scheduled Meds: . [START ON 08/22/2015] pantoprazole (PROTONIX) IV  40 mg Intravenous Q12H   Infusions: . sodium chloride    . pantoprozole (PROTONIX) infusion 8 mg/hr (08/18/15 1338)  . piperacillin-tazobactam (ZOSYN)  IV    . [START  ON 08/19/2015] vancomycin     PRN Meds:    Allergies as of 08/18/2015  . (No Known Allergies)    Family History  Problem Relation Age of Onset  . Stroke Mother   . Diabetes Mother   . Colon cancer Neg Hx   . Diabetes Maternal Grandmother     mother side of the family aunts, MGF  . Breast cancer Paternal Grandmother     Social History   Social History  . Marital Status: Married    Spouse Name: N/A  . Number of Children: 2  . Years of Education: N/A   Occupational History  . INSPECTOR     works Youth worker   Social History Main Topics  . Smoking status: Former Smoker -- 1.00 packs/day for 35 years    Types: Cigarettes    Quit date: 10/31/2008  . Smokeless tobacco: Never Used  . Alcohol Use: 0.0 oz/week    0 Standard drinks or equivalent per week     Comment: 2/month only on vacation  . Drug Use: No  . Sexual Activity: No   Other Topics Concern  . 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 ENT:  No nose bleeds Pulm:  Stable DOE CV:  No palpitations, no LE edema.  GU:  No hematuria, no frequency GI:  Per HPI Heme:  No excessive bleeding or unusual bruising.   Transfusions:  Just started the first transfusion now. Had never received transfusions before this. Neuro:  No headaches, no peripheral tingling or numbness Derm:  No itching, no rash or sores.  Endocrine:  No sweats or chills.  No polyuria or dysuria Immunization:  Flu shot and multiple other vaccinations are all up-to-date. Travel:  None beyond local counties in last few months.    PHYSICAL EXAM: Vital signs in last 24 hours: Filed Vitals:   08/18/15 1448 08/18/15 1500  BP: 112/60 117/61  Pulse: 100 102  Temp: 98.8 F (37.1 C)   Resp: 20 16   Wt Readings from Last 3 Encounters:  08/06/15 113.1 kg (249 lb 5.4 oz)  07/24/15 113.807 kg (250 lb 14.4 oz)  07/21/15 116.438 kg (256 lb 11.2 oz)    General: Pleasant, somewhat pale but looks better than expected. Not acutely ill-appearing WM. Head:  No swelling, no facial asymmetry. No signs of head trauma.  Eyes:  No scleral icterus or conjunctival pallor. Ears:  Not hard of hearing.  Nose:  No congestion or discharge. Mouth:  Mucous membranes are slightly pale. Dentition is fair with some missing teeth. Neck:  No JVD, no masses, no thyromegaly. Lungs:  Unlabored breathing. No cough. Breath sounds clear throughout. Heart: RRR. Minimally tachycardic. S1/S2 audible. Abdomen:  J-tube site is benign appearing with slight minimal erythema just at the margins. No discharge.  Upper abdominal midline incision is well-healed. Rectal: Did not repeat, her emergency room PA the stool is burgundy in color and described as melenic.   Musc/Skeltl: No joint erythema, swelling, contracture deformities. Extremities:  No CCE.  Neurologic:  Alert. Oriented 3. Moves all 4 limbs, limb strength not tested. Skin:  No rash, no sores, no  suspicious lesions. Tattoos:  None observed Nodes:  No cervical  adenopathy.   Psych:  Pleasant, calm, cooperative.  Intake/Output from previous day:   Intake/Output this shift: Total I/O In: 1825 [I.V.:325; Other:1500] Out: 50 [Urine:50]  LAB RESULTS:  Recent Labs  08/18/15 1215  WBC 8.5  HGB 4.4*  HCT 14.1*  PLT 197   BMET Lab Results  Component Value Date   NA 139 08/18/2015   NA 146* 08/01/2015   NA 146* 07/31/2015   K 4.5 08/18/2015   K 3.8 08/01/2015   K 3.7 07/31/2015   CL 103 08/18/2015   CL 108 08/01/2015   CL 110 07/31/2015   CO2 26 08/18/2015   CO2 30 08/01/2015   CO2 27 07/31/2015   GLUCOSE 237* 08/18/2015   GLUCOSE 154* 08/01/2015   GLUCOSE 123* 07/31/2015   BUN 42* 08/18/2015   BUN 30* 08/01/2015   BUN 31* 07/31/2015   CREATININE 1.30* 08/18/2015   CREATININE 1.08 08/01/2015   CREATININE 1.06 07/31/2015   CALCIUM 8.7* 08/18/2015   CALCIUM 8.9 08/01/2015   CALCIUM 8.9 07/31/2015   LFT  Recent Labs  08/18/15 1215  PROT 5.9*  ALBUMIN 2.7*  AST 19  ALT 15*  ALKPHOS 81  BILITOT 0.2*   PT/INR Lab Results  Component Value Date   INR 1.39 08/18/2015   INR 1.08 07/24/2015   INR 1.16 09/10/2011   Hepatitis Panel No results for input(s): HEPBSAG, HCVAB, HEPAIGM, HEPBIGM in the last 72 hours. C-Diff No components found for: CDIFF Lipase     Component Value Date/Time   LIPASE 72.0* 01/31/2015 1614    Drugs of Abuse  No results found for: LABOPIA, COCAINSCRNUR, LABBENZ, AMPHETMU, THCU, LABBARB   RADIOLOGY STUDIES: Dg Chest 1 View  08/18/2015  CLINICAL DATA:  Gastrointestinal bleeding. EXAM: CHEST 1 VIEW COMPARISON:  August 02, 2015. FINDINGS: The heart size and mediastinal contours are within normal limits. Both lungs are clear. No pneumothorax or pleural effusion is noted. The visualized skeletal structures are unremarkable. IMPRESSION: No acute cardiopulmonary abnormality seen. Electronically Signed   By: Marijo Conception, M.D.    On: 08/18/2015 14:12   Dg Abd 1 View  08/18/2015  CLINICAL DATA:  GI bleed. Recent esophagectomy on 07/25/2015 for esophageal cancer. Fatigue and weakness. EXAM: ABDOMEN - 1 VIEW COMPARISON:  CT scan dated 07/20/2015 FINDINGS: There is large bore jejunostomy tube in the mid abdomen. Minimal air in nondistended bowel. No visible free air or free fluid. No acute osseous abnormality. IMPRESSION: No acute abnormalities. Jejunostomy tube in place with the tip in the mid right abdomen. Electronically Signed   By: Lorriane Shire M.D.   On: 08/18/2015 14:14    ENDOSCOPIC STUDIES: Per HPI  IMPRESSION:   *  Upper GI bleed.  In pt on Xarelto.  S/p esophagectomy 12/6 for esophageal adenoca.  Rule out anastomotic bleed. Rule out radiation associated mucosal changes leading to bleeding..   *  ABL anemia.  First of 2 units PRBCs have been started.  *  Type 1 DM.      PLAN:     *  EGD?  Will await thoracic surgeon, Dr Lucianne Lei Trigt's, opinion.    Azucena Freed  08/18/2015, 3:22 PM Pager: [phone]

## 2015-08-18 NOTE — ED Notes (Addendum)
GI at bedside.  EDP at bedside.

## 2015-08-18 NOTE — Progress Notes (Signed)
Procedure(s) (LRB): ESOPHAGOGASTRODUODENOSCOPY (EGD) (N/A) Subjective: Patient examined and medical record reviewed Severe upper GI bleed with Hb 5 after esophagectomy for      stage 3 cancer Dec 6- on Xarelto due to PE on CT Dec 1st No sig pain Surgical incisions healing well Has lost 8 lbs since surgery but swallowing well and supplementing with J-tube feeds 5 cans/day INR 1.4, plts 200k  Objective: Vital signs in last 24 hours: Temp:  [97.8 F (36.6 C)-98.8 F (37.1 C)] 97.8 F (36.6 C) (12/30 1700) Pulse Rate:  [98-109] 104 (12/30 1615) Cardiac Rhythm:  [-] Normal sinus rhythm (12/30 1700) Resp:  [16-25] 23 (12/30 1615) BP: (93-140)/(47-79) 126/66 mmHg (12/30 1700) SpO2:  [93 %-100 %] 100 % (12/30 1615)  Hemodynamic parameters for last 24 hours:  stable nsr  Intake/Output from previous day:   Intake/Output this shift: Total I/O In: 2160 [I.V.:325; Blood:335; Other:1500] Out: 700 [Urine:700]  Alert comfortable Neck and abd incisions well healed Abd nontender Lungs clear  Lab Results:  Recent Labs  08/18/15 1215  WBC 8.5  HGB 4.4*  HCT 14.1*  PLT 197   BMET:  Recent Labs  08/18/15 1215  NA 139  K 4.5  CL 103  CO2 26  GLUCOSE 237*  BUN 42*  CREATININE 1.30*  CALCIUM 8.7*    PT/INR:  Recent Labs  08/18/15 1215  LABPROT 17.2*  INR 1.39   ABG    Component Value Date/Time   PHART 7.510* 08/18/2015 1235   HCO3 26.6* 08/18/2015 1235   TCO2 28 08/18/2015 1235   ACIDBASEDEF 1.8 07/26/2015 0438   O2SAT 97.0 08/18/2015 1235   CBG (last 3)   Recent Labs  08/18/15 1214  GLUCAP 218*    Assessment/Plan: S/P esophagectomy with GI bleeding from [place] pass endoscope thru anastomosis in neck to examine stomach Stop all anticoagulation, cont Bid IV Protonix Check [place] to R/o DVT then start SCDs Wouldnot place caval filter at this time - only if recurrent PE documented which is low probability at this time Cont with oral diet andJ-tube  supplements when bleeding stops- if not able to be fed in 48 hrs will place PICC and start TNA Will follow     LOS: 0 days    Tharon Aquas Trigt III 08/18/2015

## 2015-08-18 NOTE — Progress Notes (Signed)
ANTIBIOTIC CONSULT NOTE - INITIAL  Pharmacy Consult for Vancomycin/Zosyn Indication: rule out sepsis  No Known Allergies  Patient Measurements: Wt: 113.1 kg Ht: 6\' 4"  Adjusted Body Weight: 97.3 kg  Vital Signs: Temp: 97.8 F (36.6 C) (12/30 1201) Temp Source: Oral (12/30 1201) BP: 97/54 mmHg (12/30 1200) Pulse Rate: 109 (12/30 1200) Intake/Output from previous day:   Intake/Output from this shift:    Labs: No results for input(s): WBC, HGB, PLT, LABCREA, CREATININE in the last 72 hours. Estimated Creatinine Clearance: 91.3 mL/min (by C-G formula based on Cr of 1.08). No results for input(s): VANCOTROUGH, VANCOPEAK, VANCORANDOM, GENTTROUGH, GENTPEAK, GENTRANDOM, TOBRATROUGH, TOBRAPEAK, TOBRARND, AMIKACINPEAK, AMIKACINTROU, AMIKACIN in the last 72 hours.   Microbiology: Recent Results (from the past 720 hour(s))  Surgical pcr screen     Status: None   Collection Time: 07/24/15  3:34 PM  Result Value Ref Range Status   MRSA, PCR NEGATIVE NEGATIVE Final   Staphylococcus aureus NEGATIVE NEGATIVE Final    Comment:        The Xpert SA Assay (FDA approved for NASAL specimens in patients over 24 years of age), is one component of a comprehensive surveillance program.  Test performance has been validated by [facility] for patients greater than or equal to 67 year old. It is not intended to diagnose infection nor to guide or monitor treatment.     Medical History: Past Medical History  Diagnosis Date  . COLONIC POLYPS, ADENOMATOUS 08/01/2008    09/01/2012 also  . DIABETES MELLITUS, TYPE I 03/13/2007  . HYPERCHOLESTEROLEMIA 02/03/2008  . ANEMIA 08/15/2009  . Anxiety state, unspecified 12/15/2008  . DEPRESSION 03/13/2007  . HYPERTENSION 03/13/2007  . PULMONARY NODULE 11/24/2008  . GERD 03/13/2007  . TOBACCO ABUSE 11/24/2008  . EMPHYSEMA 11/08/2008  . Prostatism   . ED (erectile dysfunction)   . [name]'s esophagus   . Hiatal hernia   . Diverticulitis   . Arthritis   .  COPD (chronic obstructive pulmonary disease) ([place])   . Shortness of breath dyspnea     WITH EXERTION   . Cancer [facility])     Assessment: 69 yo M presents to ED with generalized weakness/fatigue and SOB. Pt recently had surgery to remove part of his esophagus and stomach. Patient is afebrile and labs are pending. Recent admission shows SCr 1.08, CrCl ~90 ml/min  Goal of Therapy:  Vancomycin trough level 15-20 mcg/ml  Plan:  - Vancomycin 2500 mg IV x1 loading dose - Vancomycin 1g IV q12h - Zosyn 3.375g IV (30 minute infusion) x1, followed by Zosyn 3.375g IV q8h - Monitor C&S, vitals and clinical progression - Monitor VT at steady state  Dimitri Ped, PharmD. PGY-1 Pharmacy Resident Pager: [phone]  08/18/2015,12:21 PM   Pharmacy Code Sepsis Protocol  Time of code sepsis page: 1217 Time of antibiotic delivery: 1239  Were antibiotics ordered at the time of the code sepsis page? Yes Was it required to contact the physician? [x]  Physician not contacted []  Physician contacted to order antibiotics for code sepsis []  Physician contacted to recommend changing antibiotics  Pharmacy consulted for: Vancomycin/Zosyn  Anti-infectives    Start     Dose/Rate Route Frequency Ordered Stop   08/19/15 0030  vancomycin (VANCOCIN) IVPB 1000 mg/200 mL premix     1,000 mg 200 mL/hr over 60 Minutes Intravenous Every 12 hours 08/18/15 1230     08/18/15 1830  piperacillin-tazobactam (ZOSYN) IVPB 3.375 g     3.375 g 12.5 mL/hr over 240 Minutes Intravenous 3 times  per day 08/18/15 1229     08/18/15 1230  vancomycin (VANCOCIN) 2,500 mg in sodium chloride 0.9 % 500 mL IVPB     2,500 mg 250 mL/hr over 120 Minutes Intravenous  Once 08/18/15 1228     08/18/15 1215  piperacillin-tazobactam (ZOSYN) IVPB 3.375 g     3.375 g 100 mL/hr over 30 Minutes Intravenous  Once 08/18/15 1206     08/18/15 1215  vancomycin (VANCOCIN) IVPB 1000 mg/200 mL premix  Status:  Discontinued     1,000 mg 200 mL/hr over 60  Minutes Intravenous  Once 08/18/15 1206 08/18/15 1228        Nurse education provided: []  Minutes left to administer antibiotics to achieve 1 hour goal [x]  Correct order of antibiotic administration []  Antibiotic Y-site compatibilities    Dimitri Ped, PharmD. PGY-1 Pharmacy Resident Pager: [phone] 08/18/2015, 12:32 PM

## 2015-08-18 NOTE — ED Notes (Signed)
Patient transported to X-ray 

## 2015-08-18 NOTE — Progress Notes (Signed)
CBG 95 

## 2015-08-18 NOTE — ED Provider Notes (Signed)
CSN: [id number]     Arrival date & time 08/18/15  1146 History   First MD Initiated Contact with Patient 08/18/15 1151     Chief Complaint  Patient presents with  . Fatigue  . Shortness of Breath     (Consider location/radiation/quality/duration/timing/severity/associated sxs/prior Treatment) HPI   Patient to the ER for fatigue and SOB. He has a significant PMH which includes diabetes, anemia, COPD and recent removal of his esophagus and stomach due to cancer that according to note review has not metastasized. The patients wife says he left the hospital earlier than the doctors recommended because he didn't want to be in the hospital anymore. He was doing well at that time, up walking, taking soft diet. She reports that since he has been home he has not wanted to do much. She reports making him get up to walk as much as she can. The past couple of days he is no longer wanting to get up and walk and is been having increasingly worsening shortness of breath. Home health has been in to see him but reports his oxygen saturation has been 96% therefore they did not feel like he needed to come to the emergency department. The wife says over the past couple of days has been becoming increasingly pale. She decided to bring him to the emergency department today when she came home after being out for a few hours he had taken his clothes off because he said he felt very hot. The patient appears pale, weak, tachypnea, dry and toxic on my initial impression.  He is on  Xarelto. After recent diagnosis of small PE. Wife states that he does eat by mouth and has been drinking plain fluids. He also takes nutrition through feeding tube. Denies pain, denies that his SOB is like his usual COPD exacerbation.  Initiation of radiation 04/19/2015, cycle 1 Taxol/carboplatin 04/20/2015: Radiation completed 05/29/2015; the fifth and final week of Taxol/carboplatin 05/18/2015  December 5 TRANSHIATAL TOTAL ESOPHAGECTOMY  COMPLETE PYLOROMYOTOMY (N/A) FEEDING JEJUNOSTOMY (N/A) VIDEO BRONCHOSCOPY (N/A)  Past Medical History  Diagnosis Date  . COLONIC POLYPS, ADENOMATOUS 08/01/2008, 2013, 2014  . DIABETES MELLITUS, TYPE I 03/13/2007  . HYPERCHOLESTEROLEMIA 02/03/2008  . Anemia 08/15/2009  . Depression with anxiety 03/13/2007  . HYPERTENSION 03/13/2007  . PULMONARY NODULE 11/24/2008  . GERD 03/13/2007  . TOBACCO ABUSE 11/24/2008  . EMPHYSEMA 11/08/2008  . Prostatism   . ED (erectile dysfunction)   . [name]'s esophagus 2007  . Hiatal hernia   . Diverticulitis   . Arthritis   . COPD (chronic obstructive pulmonary disease) [facility])    Past Surgical History  Procedure Laterality Date  . Egd  11/19/2005  . Foot fracture surgery Right 1980    right foot w/ pins and screws  . Removal pins and screws foot  1980    right foot  . Upper gastrointestinal endoscopy    . Circumcision    . Colonoscopy    . Polypectomy    . Vasectomy    . Eus N/A 03/16/2015    Procedure: UPPER ENDOSCOPIC ULTRASOUND (EUS) RADIAL;  Surgeon: Milus Banister, MD;  Location: [facility];  Service: Endoscopy;  Laterality: N/A;  . Complete esophagectomy N/A 07/25/2015    Procedure: TRANSHIATAL TOTAL ESOPHAGECTOMY COMPLETE PYLOROMYOTOMY;  Surgeon: [name], MD;  Location: [place];  Service: Thoracic;  Laterality: N/A;  . Jejunostomy N/A 07/25/2015    Procedure: FEEDING JEJUNOSTOMY;  Surgeon: [name], MD;  Location: [place];  Service: Thoracic;  Laterality: N/A;  . Video bronchoscopy N/A 07/25/2015    Procedure: VIDEO BRONCHOSCOPY;  Surgeon: [name], MD;  Location: [facility] OR;  Service: Thoracic;  Laterality: N/A;   Family History  Problem Relation Age of Onset  . Stroke Mother   . Diabetes Mother   . Colon cancer Neg Hx   . Diabetes Maternal Grandmother     mother side of the family aunts, MGF  . Breast cancer Paternal Grandmother    Social History  Substance Use Topics  . Smoking status: Former Smoker -- 1.00 packs/day  for 35 years    Types: Cigarettes    Quit date: 10/31/2008  . Smokeless tobacco: Never Used  . Alcohol Use: 0.0 oz/week    0 Standard drinks or equivalent per week     Comment: 2/month only on vacation    Review of Systems    Allergies  Review of patient's allergies indicates no known allergies.  Home Medications   Prior to Admission medications   Medication Sig Start Date End Date Taking? Authorizing Provider  atorvastatin (LIPITOR) 10 MG tablet Take 1 tablet by mouth  every day Patient taking differently: Take 1 tablet by mouth  every evening 04/11/15  Yes [name], MD  esomeprazole (NEXIUM) 40 MG capsule Take 1 capsule (40 mg total) by mouth daily at 12 noon. 07/07/15  Yes Jessica D Zehr, PA-C  feeding supplement, ENSURE ENLIVE, (ENSURE ENLIVE) LIQD Take 237 mLs by mouth 2 (two) times daily between meals. 08/05/15  Yes [name] R [name], PA-C  HYDROcodone-acetaminophen (HYCET) 7.5-325 mg/15 ml solution Take 10 mLs by mouth every 4 (four) hours as needed for moderate pain. 08/05/15  Yes [name] R [name], PA-C  insulin NPH Human (HUMULIN N) 100 UNIT/ML injection Inject 300 units into the skin every morning. Patient taking differently: Inject 300 Units into the skin daily before breakfast. Inject 300 units into the skin every morning. 07/06/15  Yes [name], MD  losartan-hydrochlorothiazide (HYZAAR) 100-12.5 MG tablet Take 1 tablet by mouth daily. 06/26/15  Yes Historical Provider, MD  Nutritional Supplements (FEEDING SUPPLEMENT, VITAL AF 1.2 CAL,) LIQD Place 1,000 mLs into feeding tube continuous. At 70 ml/hr from 7pm -7am 08/05/15  Yes [name] R [name], PA-C  Rivaroxaban (XARELTO) 15 MG TABS tablet Take 1 tablet (15 mg total) by mouth 2 (two) times daily with a meal. 08/06/15  Yes [name] R [name], PA-C  venlafaxine XR (EFFEXOR-XR) 150 MG 24 hr capsule Take 150 mg by mouth daily with breakfast.   Yes Historical Provider, MD  diazepam (VALIUM) 5 MG tablet Take 1 tablet (5 mg total) by  mouth 2 (two) times daily as needed. Patient taking differently: Take 5 mg by mouth 2 (two) times daily as needed for anxiety.  06/26/15   [name], MD  glucose blood (PRECISION XTRA TEST STRIPS) test strip Check blood sugar three times a day dx 250.01 06/17/14   [name], MD  Insulin Syringe-Needle U-100 30G X 3/8" 1 ML MISC Use to inject insulin 3 times daily. 06/06/15   [name], MD  sildenafil (REVATIO) 20 MG tablet TAKE 1-5 TABLETS BY MOUTH ONCE DAILY AS NEEDED 02/07/15   [name], MD   BP 139/64 mmHg  Pulse 98  Temp(Src) 97.8 F (36.6 C) (Oral)  Resp 20  SpO2 100% Physical Exam  Constitutional: He appears well-developed and well-nourished. He appears toxic. He appears distressed.  HENT:  Head: Normocephalic and atraumatic.  Right Ear: Tympanic membrane and ear canal normal.  Left Ear: Tympanic membrane and ear canal normal.  Nose: Nose normal.  Mouth/Throat: Uvula is midline, oropharynx is clear and moist and mucous membranes are normal.  Pale conjunctiva and oral mucosa  Eyes: Pupils are equal, round, and reactive to light.  Neck: Normal range of motion. Neck supple.  Cardiovascular: Regular rhythm.  Tachycardia present.   Pulmonary/Chest: Effort normal. Tachypnea noted.  Abdominal: Soft.  No signs of abdominal distention or pain to palpation  Genitourinary: Guaiac positive stool (melena present).  Musculoskeletal:  No LE swelling.   Neurological: He is alert.  Global weakness  Skin: Skin is warm. No rash noted. There is pallor.  Nursing note and vitals reviewed.   ED Course  Procedures (including critical care time) Labs Review Labs Reviewed  COMPREHENSIVE METABOLIC PANEL - Abnormal; Notable for the following:    Glucose, Bld 237 (*)    BUN 42 (*)    Creatinine, Ser 1.30 (*)    Calcium 8.7 (*)    Total Protein 5.9 (*)    Albumin 2.7 (*)    ALT 15 (*)    Total Bilirubin 0.2 (*)    GFR calc non Af Amer 55 (*)    All other components within normal  limits  CBC WITH DIFFERENTIAL/PLATELET - Abnormal; Notable for the following:    RBC 1.42 (*)    Hemoglobin 4.4 (*)    HCT 14.1 (*)    RDW 15.6 (*)    All other components within normal limits  PROTIME-INR - Abnormal; Notable for the following:    Prothrombin Time 17.2 (*)    All other components within normal limits  I-STAT CG4 LACTIC ACID, ED - Abnormal; Notable for the following:    Lactic Acid, Venous 2.75 (*)    All other components within normal limits  CBG MONITORING, ED - Abnormal; Notable for the following:    Glucose-Capillary 218 (*)    All other components within normal limits  POC OCCULT BLOOD, ED - Abnormal; Notable for the following:    Fecal Occult Bld POSITIVE (*)    All other components within normal limits  I-STAT ARTERIAL BLOOD GAS, ED - Abnormal; Notable for the following:    pH, Arterial 7.510 (*)    pCO2 arterial 33.3 (*)    Bicarbonate 26.6 (*)    Acid-Base Excess 3.0 (*)    All other components within normal limits  CULTURE, BLOOD (ROUTINE X 2)  CULTURE, BLOOD (ROUTINE X 2)  URINE CULTURE  URINALYSIS, ROUTINE W REFLEX MICROSCOPIC (NOT AT [facility])  OCCULT BLOOD X 1 CARD TO LAB, STOOL  BLOOD GAS, ARTERIAL  TROPONIN I  I-STAT CG4 LACTIC ACID, ED  TYPE AND SCREEN  PREPARE RBC (CROSSMATCH)    Imaging Review Dg Chest 1 View  08/18/2015  CLINICAL DATA:  Gastrointestinal bleeding. EXAM: CHEST 1 VIEW COMPARISON:  August 02, 2015. FINDINGS: The heart size and mediastinal contours are within normal limits. Both lungs are clear. No pneumothorax or pleural effusion is noted. The visualized skeletal structures are unremarkable. IMPRESSION: No acute cardiopulmonary abnormality seen. Electronically Signed   By: Marijo Conception, M.D.   On: 08/18/2015 14:12   Dg Abd 1 View  08/18/2015  CLINICAL DATA:  GI bleed. Recent esophagectomy on 07/25/2015 for esophageal cancer. Fatigue and weakness. EXAM: ABDOMEN - 1 VIEW COMPARISON:  CT scan dated 07/20/2015 FINDINGS: There  is large bore jejunostomy tube in the mid abdomen. Minimal air in nondistended bowel. No visible free air or free fluid. No acute  osseous abnormality. IMPRESSION: No acute abnormalities. Jejunostomy tube in place with the tip in the mid right abdomen. Electronically Signed   By: Lorriane Shire M.D.   On: 08/18/2015 14:14   I have personally reviewed and evaluated these images and lab results as part of my medical decision-making.   EKG Interpretation   Date/Time:  Friday August 18 2015 12:00:18 EST Ventricular Rate:  110 PR Interval:  153 QRS Duration: 88 QT Interval:  321 QTC Calculation: 434 R Axis:   77 Text Interpretation:  Sinus tachycardia LVH with secondary repolarization  abnormality ST depr, consider ischemia, inferior leads worsening ST  depressions laterally Confirmed by Wyvonnia Dusky  MD, STEPHEN [id number]) on  08/18/2015 12:19:56 PM      MDM   Final diagnoses:  Melena  Anemia requiring transfusions  Weakness  SOB (shortness of breath)   CRITICAL CARE Performed by: Linus Mako Total critical care time:  40  minutes Critical care time was exclusive of separately billable procedures and treating other patients. Critical care was necessary to treat or prevent imminent or life-threatening deterioration. Critical care was time spent personally by me on the following activities: development of treatment plan with patient and/or surrogate as well as nursing, discussions with consultants, evaluation of patient's response to treatment, examination of patient, obtaining history from patient or surrogate, ordering and performing treatments and interventions, ordering and review of laboratory studies, ordering and review of radiographic studies, pulse oximetry and re-evaluation of patient's condition.   12:02 pm- Dr. Wyvonnia Dusky informed patient is sick and has seen him as well. Dr. Wyvonnia Dusky has seen patient as well and POC hemoccult is positive with melena on exam.   After fluids  the patients BP has improved to 122/61 @ 12: 50.  Hemoglobin is 4.4, PRB 2 units transfuse ordered.  1:52 pm- Gastroenterology Has agreed to consult on patient and requests that the patients surgeon be consulted as well.  2: 37 pm- Dr. Prescott Gum the Cardiothoracic Surgeon have been notified of the patients status and have agreed to consult on patient.  Filed Vitals:   08/18/15 1315 08/18/15 1330  BP: 140/62 139/64  Pulse: 99 98  Temp:    Resp: 21 20    Dr. Wyvonnia Dusky is calling [facility] hospitalist for admission.  Delos Haring, PA-C 08/18/15 [place], MD 08/18/15 2011

## 2015-08-19 ENCOUNTER — Inpatient Hospital Stay (HOSPITAL_COMMUNITY): Payer: Medicare Other

## 2015-08-19 ENCOUNTER — Encounter (HOSPITAL_COMMUNITY)
Admission: EM | Disposition: A | Discharge status: Home / Self Care | Payer: Self-pay | Source: Home / Self Care | Attending: Internal Medicine

## 2015-08-19 DIAGNOSIS — Z86711 Personal history of pulmonary embolism: Secondary | ICD-10-CM | POA: Insufficient documentation

## 2015-08-19 DIAGNOSIS — R778 Other specified abnormalities of plasma proteins: Secondary | ICD-10-CM | POA: Insufficient documentation

## 2015-08-19 DIAGNOSIS — D539 Nutritional anemia, unspecified: Secondary | ICD-10-CM | POA: Insufficient documentation

## 2015-08-19 DIAGNOSIS — R7989 Other specified abnormal findings of blood chemistry: Secondary | ICD-10-CM | POA: Insufficient documentation

## 2015-08-19 DIAGNOSIS — K921 Melena: Principal | ICD-10-CM

## 2015-08-19 LAB — CBC
HCT: 21 % — ABNORMAL LOW (ref 39.0–52.0)
HCT: 21.7 % — ABNORMAL LOW (ref 39.0–52.0)
HEMATOCRIT: 16.9 % — AB (ref 39.0–52.0)
HEMOGLOBIN: 5.4 g/dL — AB (ref 13.0–17.0)
Hemoglobin: 6.7 g/dL — CL (ref 13.0–17.0)
Hemoglobin: 7.1 g/dL — ABNORMAL LOW (ref 13.0–17.0)
MCH: 30.5 pg (ref 26.0–34.0)
MCH: 30.2 pg (ref 26.0–34.0)
MCH: 30.3 pg (ref 26.0–34.0)
MCHC: 32 g/dL (ref 30.0–36.0)
MCHC: 31.9 g/dL (ref 30.0–36.0)
MCHC: 32.7 g/dL (ref 30.0–36.0)
MCV: 95.5 fL (ref 78.0–100.0)
MCV: 94.6 fL (ref 78.0–100.0)
MCV: 92.7 fL (ref 78.0–100.0)
PLATELETS: 161 10*3/uL (ref 150–400)
Platelets: 156 10*3/uL (ref 150–400)
Platelets: 126 10*3/uL — ABNORMAL LOW (ref 150–400)
RBC: 1.77 MIL/uL — AB (ref 4.22–5.81)
RBC: 2.22 MIL/uL — ABNORMAL LOW (ref 4.22–5.81)
RBC: 2.34 MIL/uL — ABNORMAL LOW (ref 4.22–5.81)
RDW: 17.7 % — ABNORMAL HIGH (ref 11.5–15.5)
RDW: 17.3 % — ABNORMAL HIGH (ref 11.5–15.5)
RDW: 17.9 % — ABNORMAL HIGH (ref 11.5–15.5)
WBC: 6.5 10*3/uL (ref 4.0–10.5)
WBC: 6.8 10*3/uL (ref 4.0–10.5)
WBC: 5.9 10*3/uL (ref 4.0–10.5)

## 2015-08-19 LAB — TROPONIN I
TROPONIN I: 0.55 ng/mL — AB (ref <0.031)
Troponin I: 0.63 ng/mL (ref <0.031)
Troponin I: 0.69 ng/mL (ref <0.031)
Troponin I: 0.77 ng/mL (ref <0.031)

## 2015-08-19 LAB — URINE CULTURE: Culture: NO GROWTH

## 2015-08-19 LAB — GLUCOSE, CAPILLARY
GLUCOSE-CAPILLARY: 100 mg/dL — AB (ref 65–99)
GLUCOSE-CAPILLARY: 107 mg/dL — AB (ref 65–99)
GLUCOSE-CAPILLARY: 76 mg/dL (ref 65–99)
Glucose-Capillary: 70 mg/dL (ref 65–99)

## 2015-08-19 LAB — BASIC METABOLIC PANEL
Anion gap: 5 (ref 5–15)
BUN: 29 mg/dL — AB (ref 6–20)
CALCIUM: 8.3 mg/dL — AB (ref 8.9–10.3)
CO2: 27 mmol/L (ref 22–32)
Chloride: 111 mmol/L (ref 101–111)
Creatinine, Ser: 1.17 mg/dL (ref 0.61–1.24)
Glucose, Bld: 70 mg/dL (ref 65–99)
Potassium: 3.9 mmol/L (ref 3.5–5.1)
SODIUM: 143 mmol/L (ref 135–145)

## 2015-08-19 LAB — PREPARE RBC (CROSSMATCH)

## 2015-08-19 LAB — PROTIME-INR
INR: 1.06 (ref 0.00–1.49)
Prothrombin Time: 14 seconds (ref 11.6–15.2)

## 2015-08-19 LAB — LACTIC ACID, PLASMA
LACTIC ACID, VENOUS: 0.6 mmol/L (ref 0.5–2.0)
Lactic Acid, Venous: 0.6 mmol/L (ref 0.5–2.0)

## 2015-08-19 LAB — HEMOGLOBIN A1C
Hgb A1c MFr Bld: 8 % — ABNORMAL HIGH (ref 4.8–5.6)
Mean Plasma Glucose: 183 mg/dL

## 2015-08-19 SURGERY — EGD (ESOPHAGOGASTRODUODENOSCOPY)
Anesthesia: Moderate Sedation

## 2015-08-19 MED ORDER — PROTHROMBIN COMPLEX CONC HUMAN 500 UNITS IV KIT
4890.0000 [IU] | PACK | Status: AC
  Administered 2015-08-19: 4890 [IU] via INTRAVENOUS
  Filled 2015-08-19: qty 196

## 2015-08-19 MED ORDER — MORPHINE SULFATE (PF) 2 MG/ML IV SOLN
1.0000 mg | Freq: Four times a day (QID) | INTRAVENOUS | Status: DC | PRN
  Administered 2015-08-19 – 2015-08-21 (×6): 1 mg via INTRAVENOUS
  Filled 2015-08-19 (×6): qty 1

## 2015-08-19 MED ORDER — SODIUM CHLORIDE 0.9 % IV SOLN
Freq: Once | INTRAVENOUS | Status: AC
  Administered 2015-08-19: 07:00:00 via INTRAVENOUS

## 2015-08-19 MED ORDER — DEXTROSE 5 % IV SOLN
INTRAVENOUS | Status: DC
  Administered 2015-08-19 – 2015-08-20 (×2): via INTRAVENOUS

## 2015-08-19 MED ORDER — METHOCARBAMOL 1000 MG/10ML IJ SOLN
500.0000 mg | Freq: Three times a day (TID) | INTRAVENOUS | Status: DC | PRN
  Administered 2015-08-19: 500 mg via INTRAVENOUS
  Filled 2015-08-19 (×3): qty 5

## 2015-08-19 NOTE — Progress Notes (Signed)
Procedure(s) (LRB): ESOPHAGOGASTRODUODENOSCOPY (EGD) (N/A) Subjective: Patient examined, events noted and situation discussed with Dr. Havery Moros of GI The patient remains stable but severely anemic after 2 units packed cells yesterday. No blood per rectum since last night I would recommend reversing the xarelto with prothrombin complex-K Centra before proceeding with upper endoscopy which would be at high risk for injury to the hand sewn gastric anastomosis in the neck performed 3 weeks ago If the patient shows evidence of improved hemoglobin with no clinical bleeding would hold on upper endoscopy. If the patient still shows evidence of bleeding either with persistent blood per rectum or failure to improve hemoglobin with transfusion after reversal of Xarelto  then careful upper endoscopy would be necessary Vital signs in last 24 hours: Temp:  [97.5 F (36.4 C)-98.8 F (37.1 C)] 97.7 F (36.5 C) (12/31 0847) Pulse Rate:  [72-109] 78 (12/31 0847) Cardiac Rhythm:  [-] Normal sinus rhythm (12/31 0814) Resp:  [16-25] 17 (12/31 0847) BP: (93-149)/(47-79) 149/70 mmHg (12/31 0847) SpO2:  [93 %-100 %] 98 % (12/31 0847) Weight:  [238 lb 8.6 oz (108.2 kg)] 238 lb 8.6 oz (108.2 kg) (12/31 0356)  Hemodynamic parameters for last 24 hours:  Stable  Intake/Output from previous day: 12/30 0701 - 12/31 0700 In: [id number] [I.V.:1225; Blood:670] Out: [id number] [Urine:1475] Intake/Output this shift: Total I/O In: 335 [Blood:335] Out: 500 [Urine:500]  Abdomen nontender Normal sinus rhythm Neuro intact  Lab Results:  Recent Labs  08/18/15 1215 08/19/15 0508  WBC 8.5 6.5  HGB 4.4* 5.4*  HCT 14.1* 16.9*  PLT 197 161   BMET:  Recent Labs  08/18/15 1215 08/19/15 0508  NA 139 143  K 4.5 3.9  CL 103 111  CO2 26 27  GLUCOSE 237* 70  BUN 42* 29*  CREATININE 1.30* 1.17  CALCIUM 8.7* 8.3*    PT/INR:  Recent Labs  08/18/15 1215  LABPROT 17.2*  INR 1.39   ABG    Component Value Date/Time    PHART 7.510* 08/18/2015 1235   HCO3 26.6* 08/18/2015 1235   TCO2 28 08/18/2015 1235   ACIDBASEDEF 1.8 07/26/2015 0438   O2SAT 97.0 08/18/2015 1235   CBG (last 3)   Recent Labs  08/18/15 1823 08/19/15 0005 08/19/15 0632  GLUCAP 95 76 70    Assessment/Plan: S/P Procedure(s) (LRB): ESOPHAGOGASTRODUODENOSCOPY (EGD) (N/A) Reversal of xarelto with Prol thrombin complex Follow response to transfusion Hold on upper endoscopy for now   LOS: 1 day    Tharon Aquas Trigt III 08/19/2015

## 2015-08-19 NOTE — Progress Notes (Signed)
*  Preliminary Results* Bilateral lower extremity venous duplex completed. Bilateral lower extremities are negative for deep vein thrombosis. There is no evidence of Baker's cyst bilaterally.  08/19/2015  Maudry Mayhew, RVT, RDCS, RDMS

## 2015-08-19 NOTE — Progress Notes (Signed)
Progress Note   Subjective  Patient reports no further blood per rectum overnight. No abdominal pains. No chest pain. Hgb rose to only mid 5s after 2 units PRBC, getting 2 more units PRBC this morning. On IV PPI. Troponins noted to be elevated.    Objective   Vital signs in last 24 hours: Temp:  [97.5 F (36.4 C)-98.8 F (37.1 C)] 98.8 F (37.1 C) (12/31 0700) Pulse Rate:  [75-109] 75 (12/31 0656) Resp:  [16-25] 17 (12/31 0656) BP: (93-142)/(47-79) 119/64 mmHg (12/31 0656) SpO2:  [93 %-100 %] 96 % (12/31 0656) Weight:  [238 lb 8.6 oz (108.2 kg)] 238 lb 8.6 oz (108.2 kg) (12/31 0356) Last BM Date: 08/18/15 General:    white male in NAD Heart:  Regular rate and rhythm; no murmurs Lungs: Respirations even and unlabored, lungs CTA bilaterally Abdomen:  Soft, nontender and nondistended. Normal bowel sounds. Extremities:  Without edema. Neurologic:  Alert and oriented,  grossly normal neurologically. Psych:  Cooperative. Normal mood and affect.  Intake/Output from previous day: 12/30 0701 - 12/31 0700 In: 3395 [I.V.:1225; Blood:670] Out: [id number] [Urine:1475] Intake/Output this shift:    Lab Results:  Recent Labs  08/18/15 1215 08/19/15 0508  WBC 8.5 6.5  HGB 4.4* 5.4*  HCT 14.1* 16.9*  PLT 197 161   BMET  Recent Labs  08/18/15 1215 08/19/15 0508  NA 139 143  K 4.5 3.9  CL 103 111  CO2 26 27  GLUCOSE 237* 70  BUN 42* 29*  CREATININE 1.30* 1.17  CALCIUM 8.7* 8.3*   LFT  Recent Labs  08/18/15 1215  PROT 5.9*  ALBUMIN 2.7*  AST 19  ALT 15*  ALKPHOS 81  BILITOT 0.2*   PT/INR  Recent Labs  08/18/15 1215  LABPROT 17.2*  INR 1.39    Studies/Results: Dg Chest 1 View  08/18/2015  CLINICAL DATA:  Gastrointestinal bleeding. EXAM: CHEST 1 VIEW COMPARISON:  August 02, 2015. FINDINGS: The heart size and mediastinal contours are within normal limits. Both lungs are clear. No pneumothorax or pleural effusion is noted. The visualized skeletal  structures are unremarkable. IMPRESSION: No acute cardiopulmonary abnormality seen. Electronically Signed   By: Marijo Conception, M.D.   On: 08/18/2015 14:12   Dg Abd 1 View  08/18/2015  CLINICAL DATA:  GI bleed. Recent esophagectomy on 07/25/2015 for esophageal cancer. Fatigue and weakness. EXAM: ABDOMEN - 1 VIEW COMPARISON:  CT scan dated 07/20/2015 FINDINGS: There is large bore jejunostomy tube in the mid abdomen. Minimal air in nondistended bowel. No visible free air or free fluid. No acute osseous abnormality. IMPRESSION: No acute abnormalities. Jejunostomy tube in place with the tip in the mid right abdomen. Electronically Signed   By: Lorriane Shire M.D.   On: 08/18/2015 14:14       Assessment / Plan:   69 y/o male with history of recent esophagectomy for esophageal cancer, on Xarelto for history of PE, who presented with a suspected upper GI bleed with Hgb of 4.4, after a few days of symptoms. He has been on IV PPI and his Hgb did not respond appropriately to 2 units PRBC transfusion, Hgb in mid 5s; however, as of this morning he has not passed any further blood per rectum since noon yesterday and he has been hemodynamically stable overnight. He received 2 L IVF in the ER, perhaps some of his inappropriate rise in Hgb is dilutional. He is getting another 2 units of PRBC this AM. Troponin increase  noted, likely demand ischemia in the setting of severe anemia.   I spoke with Dr. Prescott Gum this morning about his case. There is concern about how recent his surgery is and that the anastomosis is in the neck, there is risk of causing bleeding or tearing at the anastomosis, especially in the setting of recent Xarelto use. The patient is otherwise hemodynamically stable. We will await his blood transfusions first, ensure Hgb is stable, trend troponins, and make sure he can tolerate sedation prior to an endoscopic attempt. Further, if he is stable without evidence of active blood loss we may just monitor  him today, however if he has active bleeding and inability to keep stable Hgb we will need to proceed with endoscopy. Spoke with the patient and wife, will follow closely.   Switzer Cellar, MD [facility] Pager [phone]     Principal Problem:   Symptomatic anemia Active Problems:   Depression with anxiety   Essential hypertension   Type 1 diabetes mellitus with renal manifestations ([place])   Esophageal cancer (HCC)   GI bleed   Pulmonary embolism (HCC)   HLD (hyperlipidemia)   GIB (gastrointestinal bleeding)   Gastrointestinal hemorrhage with melena     LOS: 1 day   Renelda Loma Zoeann Mol  08/19/2015, 7:47 AM

## 2015-08-19 NOTE — Progress Notes (Signed)
[facility] PROGRESS NOTE  Ronald Johnson [id number] DOB: 07/04/1946 DOA: 08/18/2015 PCP: [name], MD  Assessment/Plan: 1-gastrointestinal bleeding with melena: Concern for upper GI bleed -Patient will remained in stepdown  -continue on PPI drip -3 units of blood transfused; one more pending for today -Will continue NPO status, continue holding anticoagulation and follow hemoglobin trend -GI has been consulted and is planning for endoscopy once more stable, if needed   2-Symptomatic anemia: acute blood loss on chronic anemia -Will transfuse 2 more units of PRBC's -follow Hgb trend and transfuse further as needed; goal is for Hgb in the 8 range -on admission hemoglobin of 4.4  -following rec's from Cardiothoracic surgeons patient received K-Centra  3-Depression with anxiety: PO meds on hold for now -will use PRN ativan  4-Essential hypertension: -stable and well controlled currently -given acute GIB will hold on antihypertensive regimen and monitor  5-Type 1 diabetes mellitus with renal manifestations ([place]) -will hold home NPH -will use SSI -follow CBG's q6H while NPO -A1C 8.0  6-Esophageal cancer [facility]): s/p resection; healing well and doing ok -cardiothoracic surgery following him; will follow rec's  7-Pulmonary embolism ([place]):  -anticoagulation discontinued due to active bleeding Patient has been on medications for almost a whole month -no CP, no SOB and good O2 sat on RA  -LE duplex neg for DVT  8-HLD (hyperlipidemia): will hold statins while NPO  9-mild lactic acidosis:  -due to anemia and decrease perfusion  -no signs of infection on CXR or UA -will discontinue antibiotics -repeated lactic acid 0.6  10-CKD stage 2-3: Cr essentially at baseline -will follow trend -avoid sustained hypotension -holding nephrotoxic agents   11-abnormal EKG: lateral leads with some ST depression and mild elevation on troponin -discussed with  cardiology -follow troponin trend Stabilize Hgb -most likely demand ischemia -2-D echo -no CP and no SOB  Code Status: Full Family Communication: no family at bedside  Disposition Plan: remains in stepdown; continue trending Hgb; 2 units PRBCS' today and got K-Centra   Consultants:  GI  Cardiothoracic surgery  Cardiology curbside (Dr. Rayann Heman; recommended to follow 2-D echo and stabilize Hgb; no intervention anticipated at this time. Once stable can have Myoview as an outpatient)   Procedures:  See below for x-ray reports   2-D echo: pending  LE duplex: Bilateral lower extremities are negative for deep vein thrombosis. There is no evidence of Baker's cyst bilaterally.  Antibiotics:  Received Vanc and zosyn X 1 while in ED  HPI/Subjective: Afebrile, no CP and no SOB. VSS and patient in no distress. No further bleeding appreciated.  Objective: Filed Vitals:   08/19/15 1127 08/19/15 1200  BP:  139/70  Pulse:  92  Temp: 97.2 F (36.2 C)   Resp:  20    Intake/Output Summary (Last 24 hours) at 08/19/15 1340 Last data filed at 08/19/15 1127  Gross per 24 hour  Intake   3730 ml  Output   2275 ml  Net   1455 ml   Filed Weights   08/19/15 0356  Weight: 108.2 kg (238 lb 8.6 oz)    Exam:   General:  Afebrile, no CP, no SOB. Patient denies abd pain, nausea, vomiting and no further signs of active bleeding. Reports some lower back pain  Cardiovascular: S1 and S2, no rubs or gallops  Respiratory: good air movement, no wheezing or crackles  Abdomen: soft, NT, ND, positive BS  Musculoskeletal: no edema or cyanosis    Data Reviewed: Basic Metabolic Panel:  Recent Labs  Lab 08/18/15 1215 08/19/15 0508  NA 139 143  K 4.5 3.9  CL 103 111  CO2 26 27  GLUCOSE 237* 70  BUN 42* 29*  CREATININE 1.30* 1.17  CALCIUM 8.7* 8.3*   Liver Function Tests:  Recent Labs Lab 08/18/15 1215  AST 19  ALT 15*  ALKPHOS 81  BILITOT 0.2*  PROT 5.9*  ALBUMIN 2.7*    CBC:  Recent Labs Lab 08/18/15 1215 08/19/15 0508 08/19/15 1100  WBC 8.5 6.5 6.8  NEUTROABS 6.9  --   --   HGB 4.4* 5.4* 6.7*  HCT 14.1* 16.9* 21.0*  MCV 99.3 95.5 94.6  PLT 197 161 156   Cardiac Enzymes:  Recent Labs Lab 08/18/15 2359 08/19/15 0508 08/19/15 1100  TROPONINI 0.55* 0.63* 0.69*   CBG:  Recent Labs Lab 08/18/15 1214 08/18/15 1823 08/19/15 0005 08/19/15 0632 08/19/15 1245  GLUCAP 218* 95 76 70 100*    Recent Results (from the past 240 hour(s))  Blood Culture (routine x 2)     Status: None (Preliminary result)   Collection Time: 08/18/15 12:15 PM  Result Value Ref Range Status   Specimen Description BLOOD RIGHT ANTECUBITAL  Final   Special Requests BOTTLES DRAWN AEROBIC AND ANAEROBIC 5CC  Final   Culture NO GROWTH < 24 HOURS  Final   Report Status PENDING  Incomplete  Blood Culture (routine x 2)     Status: None (Preliminary result)   Collection Time: 08/18/15 12:15 PM  Result Value Ref Range Status   Specimen Description BLOOD LEFT ANTECUBITAL  Final   Special Requests BOTTLES DRAWN AEROBIC AND ANAEROBIC 5CC  Final   Culture NO GROWTH < 24 HOURS  Final   Report Status PENDING  Incomplete  Urine culture     Status: None (Preliminary result)   Collection Time: 08/18/15  2:30 PM  Result Value Ref Range Status   Specimen Description URINE, CATHETERIZED  Final   Special Requests NONE  Final   Culture NO GROWTH < 24 HOURS  Final   Report Status PENDING  Incomplete  MRSA PCR Screening     Status: None   Collection Time: 08/18/15  4:55 PM  Result Value Ref Range Status   MRSA by PCR NEGATIVE NEGATIVE Final    Comment:        The GeneXpert MRSA Assay (FDA approved for NASAL specimens only), is one component of a comprehensive MRSA colonization surveillance program. It is not intended to diagnose MRSA infection nor to guide or monitor treatment for MRSA infections.      Studies: Dg Chest 1 View  08/18/2015  CLINICAL DATA:   Gastrointestinal bleeding. EXAM: CHEST 1 VIEW COMPARISON:  August 02, 2015. FINDINGS: The heart size and mediastinal contours are within normal limits. Both lungs are clear. No pneumothorax or pleural effusion is noted. The visualized skeletal structures are unremarkable. IMPRESSION: No acute cardiopulmonary abnormality seen. Electronically Signed   By: Marijo Conception, M.D.   On: 08/18/2015 14:12   Dg Abd 1 View  08/18/2015  CLINICAL DATA:  GI bleed. Recent esophagectomy on 07/25/2015 for esophageal cancer. Fatigue and weakness. EXAM: ABDOMEN - 1 VIEW COMPARISON:  CT scan dated 07/20/2015 FINDINGS: There is large bore jejunostomy tube in the mid abdomen. Minimal air in nondistended bowel. No visible free air or free fluid. No acute osseous abnormality. IMPRESSION: No acute abnormalities. Jejunostomy tube in place with the tip in the mid right abdomen. Electronically Signed   By: Lorriane Shire M.D.  On: 08/18/2015 14:14    Scheduled Meds: . insulin aspart  0-9 Units Subcutaneous TID WC  . [START ON 08/22/2015] pantoprazole (PROTONIX) IV  40 mg Intravenous Q12H   Continuous Infusions: . dextrose 50 mL/hr at 08/19/15 0947  . pantoprozole (PROTONIX) infusion 8 mg/hr (08/19/15 1050)    Principal Problem:   Symptomatic anemia Active Problems:   Depression with anxiety   Essential hypertension   Type 1 diabetes mellitus with renal manifestations (HCC)   Esophageal cancer (HCC)   GI bleed   Pulmonary embolism (HCC)   HLD (hyperlipidemia)   GIB (gastrointestinal bleeding)   Gastrointestinal hemorrhage with melena    Time spent: 35 minutes    Barton Dubois  [facility] Pager [phone]. If 7PM-7AM, please contact night-coverage at [email or web address], password [facility] 08/19/2015, 1:40 PM  LOS: 1 day

## 2015-08-19 NOTE — Progress Notes (Signed)
CRITICAL VALUE ALERT  Critical value received:  Troponin 0.55   Date of notification:  08/19/2015   Time of notification:  0000  Critical value read back: YES  Nurse who received alert:  Karl Luke A    MD notified (1st page):  Harless Litten NP  Time of first page:  0025  MD notified (2nd page):  Time of second page:  Responding MD:  Harless Litten NP  Time MD responded:  [phone]  Repeat Troponin, EKG, and Echo ordered.  Cards notified.  Will continue to monitor

## 2015-08-20 ENCOUNTER — Inpatient Hospital Stay (HOSPITAL_COMMUNITY): Payer: Medicare Other

## 2015-08-20 DIAGNOSIS — Z86711 Personal history of pulmonary embolism: Secondary | ICD-10-CM

## 2015-08-20 DIAGNOSIS — C159 Malignant neoplasm of esophagus, unspecified: Secondary | ICD-10-CM

## 2015-08-20 DIAGNOSIS — I2510 Atherosclerotic heart disease of native coronary artery without angina pectoris: Secondary | ICD-10-CM

## 2015-08-20 LAB — CBC
HCT: 22.9 % — ABNORMAL LOW (ref 39.0–52.0)
HCT: 26.2 % — ABNORMAL LOW (ref 39.0–52.0)
Hemoglobin: 7.4 g/dL — ABNORMAL LOW (ref 13.0–17.0)
Hemoglobin: 8.6 g/dL — ABNORMAL LOW (ref 13.0–17.0)
MCH: 30.2 pg (ref 26.0–34.0)
MCH: 30.1 pg (ref 26.0–34.0)
MCHC: 32.3 g/dL (ref 30.0–36.0)
MCHC: 32.8 g/dL (ref 30.0–36.0)
MCV: 93.5 fL (ref 78.0–100.0)
MCV: 91.6 fL (ref 78.0–100.0)
PLATELETS: 128 10*3/uL — AB (ref 150–400)
Platelets: 141 10*3/uL — ABNORMAL LOW (ref 150–400)
RBC: 2.45 MIL/uL — ABNORMAL LOW (ref 4.22–5.81)
RBC: 2.86 MIL/uL — ABNORMAL LOW (ref 4.22–5.81)
RDW: 18.1 % — ABNORMAL HIGH (ref 11.5–15.5)
RDW: 17.8 % — AB (ref 11.5–15.5)
WBC: 6 10*3/uL (ref 4.0–10.5)
WBC: 5.4 10*3/uL (ref 4.0–10.5)

## 2015-08-20 LAB — GLUCOSE, CAPILLARY
GLUCOSE-CAPILLARY: 127 mg/dL — AB (ref 65–99)
GLUCOSE-CAPILLARY: 187 mg/dL — AB (ref 65–99)
Glucose-Capillary: 160 mg/dL — ABNORMAL HIGH (ref 65–99)
Glucose-Capillary: 285 mg/dL — ABNORMAL HIGH (ref 65–99)
Glucose-Capillary: 340 mg/dL — ABNORMAL HIGH (ref 65–99)

## 2015-08-20 LAB — PREPARE RBC (CROSSMATCH)

## 2015-08-20 MED ORDER — IOHEXOL 300 MG/ML  SOLN
100.0000 mL | Freq: Once | INTRAMUSCULAR | Status: AC | PRN
  Administered 2015-08-20: 100 mL via INTRAVENOUS

## 2015-08-20 MED ORDER — SODIUM CHLORIDE 0.9 % IV SOLN
25.0000 mg | Freq: Once | INTRAVENOUS | Status: AC
  Administered 2015-08-20: 25 mg via INTRAVENOUS
  Filled 2015-08-20: qty 0.5

## 2015-08-20 MED ORDER — SODIUM CHLORIDE 0.9 % IV SOLN
1000.0000 mg | Freq: Once | INTRAVENOUS | Status: AC
  Administered 2015-08-20: 1000 mg via INTRAVENOUS
  Filled 2015-08-20: qty 20

## 2015-08-20 MED ORDER — VITAL 1.5 CAL PO LIQD
1000.0000 mL | ORAL | Status: DC
  Administered 2015-08-20: 1000 mL
  Filled 2015-08-20 (×4): qty 1000

## 2015-08-20 MED ORDER — VITAL 1.5 CAL PO LIQD: 1000.0000 mL | ORAL | Status: DC

## 2015-08-20 MED ORDER — SODIUM CHLORIDE 0.9 % IV SOLN
Freq: Once | INTRAVENOUS | Status: AC
  Administered 2015-08-20: 05:00:00 via INTRAVENOUS

## 2015-08-20 MED ORDER — CHLORHEXIDINE GLUCONATE 0.12 % MT SOLN
15.0000 mL | Freq: Two times a day (BID) | OROMUCOSAL | Status: DC
  Administered 2015-08-20 – 2015-08-21 (×4): 15 mL via OROMUCOSAL
  Filled 2015-08-20 (×5): qty 15

## 2015-08-20 MED ORDER — CETYLPYRIDINIUM CHLORIDE 0.05 % MT LIQD
7.0000 mL | Freq: Two times a day (BID) | OROMUCOSAL | Status: DC
  Administered 2015-08-20 – 2015-08-21 (×4): 7 mL via OROMUCOSAL

## 2015-08-20 NOTE — Progress Notes (Signed)
  Echocardiogram 2D Echocardiogram has been performed.  Darlina Sicilian M 08/20/2015, 2:14 PM

## 2015-08-20 NOTE — Progress Notes (Signed)
Procedure(s) (LRB): ESOPHAGOGASTRODUODENOSCOPY (EGD) (N/A) Subjective: It appears his bleeding has stopped with hemoglobin 8.5 today He is losing weight and will resume jejunostomy tube feeds at 50 cc/h. We'll keep nothing by mouth except for ice chips today--she may be L to resume oral diet, insure tomorrow  Objective: Vital signs in last 24 hours: Temp:  [97.2 F (36.2 C)-99.3 F (37.4 C)] 98.6 F (37 C) (01/01 0700) Pulse Rate:  [62-92] 72 (01/01 1028) Cardiac Rhythm:  [-] Normal sinus rhythm (01/01 0734) Resp:  [15-28] 20 (01/01 1028) BP: (99-142)/(54-95) 126/67 mmHg (01/01 1028) SpO2:  [95 %-98 %] 96 % (01/01 1028) Weight:  [240 lb 1.3 oz (108.9 kg)] 240 lb 1.3 oz (108.9 kg) (01/01 0302)  Hemodynamic parameters for last 24 hours:   stable  Intake/Output from previous day: 12/31 0701 - 01/01 0700 In: 3150 [I.V.:2145; Blood:1005] Out: 2050 [Urine:2050] Intake/Output this shift: Total I/O In: -  Out: 450 [Urine:450]  Lungs clear Abdomen soft Sinus rhythm  Lab Results:  Recent Labs  08/20/15 0046 08/20/15 0845  WBC 6.0 5.4  HGB 7.4* 8.6*  HCT 22.9* 26.2*  PLT 141* 128*   BMET:  Recent Labs  08/18/15 1215 08/19/15 0508  NA 139 143  K 4.5 3.9  CL 103 111  CO2 26 27  GLUCOSE 237* 70  BUN 42* 29*  CREATININE 1.30* 1.17  CALCIUM 8.7* 8.3*    PT/INR:  Recent Labs  08/19/15 1100  LABPROT 14.0  INR 1.06   ABG    Component Value Date/Time   PHART 7.510* 08/18/2015 1235   HCO3 26.6* 08/18/2015 1235   TCO2 28 08/18/2015 1235   ACIDBASEDEF 1.8 07/26/2015 0438   O2SAT 97.0 08/18/2015 1235   CBG (last 3)   Recent Labs  08/19/15 1705 08/19/15 2305 08/20/15 0603  GLUCAP 107* 127* 160*    Assessment/Plan: S/P Procedure(s) (LRB): ESOPHAGOGASTRODUODENOSCOPY (EGD) (N/A) GI bleeding probably related to the postop Xarelto Will remain off Xarelto -- leg ultrasound shows no evidence of DVT Resume tube feeds today  Follow CBC  Hopefully will be  able to resume oral intake tomorrow   LOS: 2 days    Tharon Aquas Trigt III 08/20/2015

## 2015-08-20 NOTE — Progress Notes (Signed)
MEDICATION RELATED CONSULT NOTE - INITIAL   Pharmacy Consult for IV iron replacement Indication: Acute blood loss anemia from suspected GI bleed  No Known Allergies  Patient Measurements: Height: 6\' 4"  (193 cm) Weight: 240 lb 1.3 oz (108.9 kg) IBW/kg (Calculated) : 86.8 Adjusted Body Weight:   Vital Signs: Temp: 98.5 F (36.9 C) (01/01 1123) Temp Source: Axillary (01/01 1123) BP: 98/65 mmHg (01/01 1123) Pulse Rate: 72 (01/01 1028) Intake/Output from previous day: 12/31 0701 - 01/01 0700 In: 3150 [I.V.:2145; Blood:1005] Out: 2050 [Urine:2050] Intake/Output from this shift: Total I/O In: -  Out: 1351 [Urine:1350; Stool:1]  Labs:  Recent Labs  08/18/15 1215 08/19/15 0508  08/19/15 1954 08/20/15 0046 08/20/15 0845  WBC 8.5 6.5  < > 5.9 6.0 5.4  HGB 4.4* 5.4*  < > 7.1* 7.4* 8.6*  HCT 14.1* 16.9*  < > 21.7* 22.9* 26.2*  PLT 197 161  < > 126* 141* 128*  CREATININE 1.30* 1.17  --   --   --   --   ALBUMIN 2.7*  --   --   --   --   --   PROT 5.9*  --   --   --   --   --   AST 19  --   --   --   --   --   ALT 15*  --   --   --   --   --   ALKPHOS 81  --   --   --   --   --   BILITOT 0.2*  --   --   --   --   --   < > = values in this interval not displayed. Estimated Creatinine Clearance: 82.8 mL/min (by C-G formula based on Cr of 1.17).   Microbiology: Recent Results (from the past 720 hour(s))  Surgical pcr screen     Status: None   Collection Time: 07/24/15  3:34 PM  Result Value Ref Range Status   MRSA, PCR NEGATIVE NEGATIVE Final   Staphylococcus aureus NEGATIVE NEGATIVE Final    Comment:        The Xpert SA Assay (FDA approved for NASAL specimens in patients over 103 years of age), is one component of a comprehensive surveillance program.  Test performance has been validated by [facility] for patients greater than or equal to 31 year old. It is not intended to diagnose infection nor to guide or monitor treatment.   Blood Culture (routine x 2)      Status: None (Preliminary result)   Collection Time: 08/18/15 12:15 PM  Result Value Ref Range Status   Specimen Description BLOOD RIGHT ANTECUBITAL  Final   Special Requests BOTTLES DRAWN AEROBIC AND ANAEROBIC 5CC  Final   Culture NO GROWTH < 24 HOURS  Final   Report Status PENDING  Incomplete  Blood Culture (routine x 2)     Status: None (Preliminary result)   Collection Time: 08/18/15 12:15 PM  Result Value Ref Range Status   Specimen Description BLOOD LEFT ANTECUBITAL  Final   Special Requests BOTTLES DRAWN AEROBIC AND ANAEROBIC 5CC  Final   Culture NO GROWTH < 24 HOURS  Final   Report Status PENDING  Incomplete  Urine culture     Status: None   Collection Time: 08/18/15  2:30 PM  Result Value Ref Range Status   Specimen Description URINE, CATHETERIZED  Final   Special Requests NONE  Final   Culture NO GROWTH 1 DAY  Final   Report Status 08/19/2015 FINAL  Final  MRSA PCR Screening     Status: None   Collection Time: 08/18/15  4:55 PM  Result Value Ref Range Status   MRSA by PCR NEGATIVE NEGATIVE Final    Comment:        The GeneXpert MRSA Assay (FDA approved for NASAL specimens only), is one component of a comprehensive MRSA colonization surveillance program. It is not intended to diagnose MRSA infection nor to guide or monitor treatment for MRSA infections.     Medical History: Past Medical History  Diagnosis Date  . COLONIC POLYPS, ADENOMATOUS 08/01/2008, 2013, 2014  . DIABETES MELLITUS, TYPE I 03/13/2007  . HYPERCHOLESTEROLEMIA 02/03/2008  . Anemia 08/15/2009  . HYPERTENSION 03/13/2007  . PULMONARY NODULE 11/24/2008  . GERD 03/13/2007  . TOBACCO ABUSE 11/24/2008  . EMPHYSEMA 11/08/2008  . Prostatism   . ED (erectile dysfunction)   . [name]'s esophagus 2007  . Hiatal hernia   . Diverticulitis   . Arthritis   . COPD (chronic obstructive pulmonary disease) ([place])   . Anxiety   . Depression   . History of blood transfusion 08/18/2015    due to GIB  . GIB  (gastrointestinal bleeding) 08/18/2015  . Esophageal cancer ([place]) "dx'd ~ 01/2015"  . Stomach cancer ([place]) "dx'd ~ 01/2015"    Medications:  Scheduled:  . antiseptic oral rinse  7 mL Mouth Rinse q12n4p  . chlorhexidine  15 mL Mouth Rinse BID  . insulin aspart  0-9 Units Subcutaneous TID WC  . iron dextran (INFED/DEXFERRUM) 1000 MG IVPB  1,000 mg Intravenous Once  . [START ON 08/22/2015] pantoprazole (PROTONIX) IV  40 mg Intravenous Q12H    Assessment: Xarelto PTA for hx PE - admitted with melena and Hgb 4.4. Pharmacy consulted for IV iron replacement in the setting of acute blood loss anemia from suspected GI bleed. Has received a total of 6 units PRBC. GI following. Appears bleeding has stopped for now. Hgb trending upward from 4.4 on admission to 8.6 today. Repleting with iron dextran. Test dose given this AM; tolerated well per RN. Will continue with full infusion.   Plan:  Iron dextran 1000mg  over 5hr  Monitor CBC, further s/sx of bleeding  Judieth Keens, PharmD Clinical Pharmacy Resident 08/20/2015,12:41 PM

## 2015-08-20 NOTE — Progress Notes (Signed)
Progress Note   Subjective  Patient reports feeling better today. No nausea or vomiting. He has not had any blood per rectum now for almost 48 hours. His Hgb is slowly rising, got another unit PRBC overnight for Hgb of 7.1, post-transfusion CBC pending. CT scan done overnight to rule out retroperitoneal bleed was negative.    Objective   Vital signs in last 24 hours: Temp:  [97.2 F (36.2 C)-99.3 F (37.4 C)] 98.6 F (37 C) (01/01 0700) Pulse Rate:  [62-92] 67 (01/01 0700) Resp:  [15-28] 20 (01/01 0700) BP: (99-149)/(54-95) 125/66 mmHg (01/01 0700) SpO2:  [95 %-99 %] 97 % (01/01 0700) Weight:  [240 lb 1.3 oz (108.9 kg)] 240 lb 1.3 oz (108.9 kg) (01/01 0302) Last BM Date: 08/18/15 General:    white male in NAD Heart:  Regular rate and rhythm; no murmurs Lungs: Respirations even and unlabored, lungs CTA bilaterally Abdomen:  Soft, protuberant, nontender and nondistended. Normal bowel sounds. Extremities:  Without edema. Neurologic:  Alert and oriented,  grossly normal neurologically. Psych:  Cooperative. Normal mood and affect.  Intake/Output from previous day: 12/31 0701 - 01/01 0700 In: 3150 [I.V.:2145; Blood:1005] Out: 2050 [Urine:2050] Intake/Output this shift: Total I/O In: -  Out: 450 [Urine:450]  Lab Results:  Recent Labs  08/19/15 1100 08/19/15 1954 08/20/15 0046  WBC 6.8 5.9 6.0  HGB 6.7* 7.1* 7.4*  HCT 21.0* 21.7* 22.9*  PLT 156 126* 141*   BMET  Recent Labs  08/18/15 1215 08/19/15 0508  NA 139 143  K 4.5 3.9  CL 103 111  CO2 26 27  GLUCOSE 237* 70  BUN 42* 29*  CREATININE 1.30* 1.17  CALCIUM 8.7* 8.3*   LFT  Recent Labs  08/18/15 1215  PROT 5.9*  ALBUMIN 2.7*  AST 19  ALT 15*  ALKPHOS 81  BILITOT 0.2*   PT/INR  Recent Labs  08/18/15 1215 08/19/15 1100  LABPROT 17.2* 14.0  INR 1.39 1.06    Studies/Results: Dg Chest 1 View  08/18/2015  CLINICAL DATA:  Gastrointestinal bleeding. EXAM: CHEST 1 VIEW COMPARISON:   August 02, 2015. FINDINGS: The heart size and mediastinal contours are within normal limits. Both lungs are clear. No pneumothorax or pleural effusion is noted. The visualized skeletal structures are unremarkable. IMPRESSION: No acute cardiopulmonary abnormality seen. Electronically Signed   By: Marijo Conception, M.D.   On: 08/18/2015 14:12   Dg Abd 1 View  08/18/2015  CLINICAL DATA:  GI bleed. Recent esophagectomy on 07/25/2015 for esophageal cancer. Fatigue and weakness. EXAM: ABDOMEN - 1 VIEW COMPARISON:  CT scan dated 07/20/2015 FINDINGS: There is large bore jejunostomy tube in the mid abdomen. Minimal air in nondistended bowel. No visible free air or free fluid. No acute osseous abnormality. IMPRESSION: No acute abnormalities. Jejunostomy tube in place with the tip in the mid right abdomen. Electronically Signed   By: Lorriane Shire M.D.   On: 08/18/2015 14:14   Ct Abdomen Pelvis W Contrast  08/20/2015  CLINICAL DATA:  Acute onset of gastroesophageal bleeding, with melena. Assess for source. Initial encounter. EXAM: CT ABDOMEN AND PELVIS WITH CONTRAST TECHNIQUE: Multidetector CT imaging of the abdomen and pelvis was performed using the standard protocol following bolus administration of intravenous contrast. CONTRAST:  119mL OMNIPAQUE IOHEXOL 300 MG/ML  SOLN COMPARISON:  Abdominal radiograph from 08/18/2015, and CT of the abdomen and pelvis from 07/20/2015 FINDINGS: Trace bilateral pleural effusions are seen, with mild associated atelectasis. Relatively diffuse coronary artery calcification is  noted. The patient is status post partial gastric resection due to known gastric malignancy. There is somewhat asymmetric irregular wall thickening along the body of the stomach, with an underlying moderate to large hiatal hernia. Though this may simply reflect relative decompression, recurrent or persistent malignancy cannot be excluded. PET/CT would be helpful for further evaluation. Diffuse mildly irregular wall  thickening is noted along the visualized portions of the hernia. The liver and spleen are unremarkable in appearance. The gallbladder is within normal limits. The pancreas and adrenal glands are unremarkable. Bilateral renal cysts are seen, measuring up to 3.4 cm in size. Scattered calcifications at the renal hila are thought to be vascular in nature. There is no evidence of hydronephrosis. No renal or ureteral stones are seen. No perinephric stranding is appreciated. Postoperative omental inflammation is noted anteriorly. A J-tube is noted in expected position. The small bowel is largely decompressed. No acute vascular abnormalities are seen. Scattered calcification is noted along the abdominal aorta and its branches. The appendix is normal in caliber, without evidence of appendicitis. The colon is unremarkable in appearance. The bladder is relatively decompressed and contains a small amount of air, with a Foley catheter in place. The prostate is enlarged, measuring 5.2 cm in transverse dimension. No inguinal lymphadenopathy is seen. No acute osseous abnormalities are identified. IMPRESSION: 1. Status post partial gastric resection due to known gastric malignancy. Somewhat irregular asymmetric wall thickening along the body of the stomach, with an underlying moderate to large hiatal hernia. Though this may simply reflect relative decompression, recurrent or persistent malignancy cannot be excluded. Diffuse mildly irregular wall thickening along the visualized portions of the hernia. PET/CT would be helpful for further evaluation. 2. Trace bilateral pleural effusions, with mild associated atelectasis. 3. Relatively diffuse coronary artery calcification noted. 4. Bilateral renal cysts seen. 5. Scattered calcification along the abdominal aorta and its branches. 6. Postoperative omental inflammation noted anteriorly. 7. Enlarged prostate seen. Electronically Signed   By: Garald Balding M.D.   On: 08/20/2015 02:55        Assessment / Plan:   70 y/o male with history of recent esophagectomy for esophageal cancer, on Xarelto for history of PE, who presented with a suspected upper GI bleed with Hgb of 4.4, after a few days of symptoms. He has been on IV PPI and received a total of 5 units PRBC thus far. Xarelto reversed yesterday with K centra. Troponin elevation likely demand ischemia from severe anemia but would continue to trend. Hgb has not risen has much as expected thus far, awaiting post-transfusion CBC this AM, however he has not had blood per rectum for almost 48 hours at this time. Given his new low back pain, CT was done overnight given his slow response to PRBC transfusion to rule out RP bleed in the setting of anticoagulation with lack of GI bleeding recently, and this was negative for RP bleed. Gastric findings noted could represent postsurgical changes.  I discussed his case with Dr. Prescott Gum yesterday. We want to avoid upper endoscopy if at all possible due to the fresh anastomosis and risk of tearing / bleeding at the site. Based on lack of symptoms, he does not appear to be actively GI bleeding at this time. We will continue to trend his Hgb at this time and hope he has responded to his last unit of PRBC. If he has evidence of recurrent bleeding moving forward, we could consider a tagged RBC scan to confirm the source prior to endoscopic evaluation,  as a colonic bleed could also present like this in the setting of anticoagulation.   Please contact me with questions or if he has any evidence of active bleeding so we can reassess him.   Elkton Cellar, MD [facility] Pager [phone]   Principal Problem:   Symptomatic anemia Active Problems:   Depression with anxiety   Essential hypertension   Type 1 diabetes mellitus with renal manifestations ([place])   Esophageal cancer (HCC)   GI bleed   Pulmonary embolism (HCC)   HLD (hyperlipidemia)   GIB (gastrointestinal  bleeding)   Gastrointestinal hemorrhage with melena   Anemia requiring transfusions   Hx pulmonary embolism   Elevated troponin     LOS: 2 days   [place]  08/20/2015, 8:18 AM

## 2015-08-20 NOTE — Progress Notes (Signed)
[facility] PROGRESS NOTE  Ronald Johnson [id number] DOB: [birth year] DOA: 08/18/2015 PCP: [name], MD  Assessment/Plan: 1-gastrointestinal bleeding with melena: Concern for upper GI bleed -Patient will remained in stepdown for another 24 hours -continue on PPI drip -a total of 5 units of blood transfused; one more pending for today -Will continue NPO status for now, continue holding anticoagulation and follow hemoglobin trend -GI has been consulted and at this moment given risk of tearing fresh anastomosis and the fact the patient is stabilizing and without signs of further bleeding, will hold on any endoscopic procedure for now.    2-Symptomatic anemia: acute blood loss on chronic anemia -has received a total of 5 units of PRBC's -will also give IV iron -follow Hgb trend and transfuse further as needed; goal is for Hgb in the 8 range -on admission hemoglobin of 4.4  -following rec's from Cardiothoracic surgeons patient received K-Centra on 12/31  3-Depression with anxiety: PO meds on hold for now -will continue PRN ativan  4-Essential hypertension: -stable and well controlled currently -given acute GIB will continue holding antihypertensive regimen and monitor  5-Type 1 diabetes mellitus with renal manifestations ([place]) -will hold home NPH -will use SSI -follow CBG's q6H while NPO -A1C 8.0  6-Esophageal cancer [facility]): s/p resection; healing well and doing ok -cardiothoracic surgery following him; will follow rec's  7-Pulmonary embolism ([place]):  -anticoagulation discontinued due to active bleeding -Patient has been on medications for almost a whole month -no CP, no SOB and good O2 sat on RA  -LE duplex neg for DVT  8-HLD (hyperlipidemia): will continue holding statins while NPO  9-mild lactic acidosis:  -due to anemia and decrease perfusion  -no signs of infection on CXR or UA -will continue monitoring off antibiotics -repeated lactic acid 0.6  (resolved)  10-CKD stage 2-3:  -Cr essentially at baseline -will follow trend -avoid hypotension -holding nephrotoxic agents   11-abnormal EKG: lateral leads with some ST depression and mild elevation on troponin -discussed with cardiology and recommended: -follow 2-D echo and Stabilize Hgb -most likely demand ischemia -2-D echo pending -no CP and no SOB -Outpatient Myoview once stable. -peaked troponin 0.77  Code Status: Full Family Communication: no family at bedside  Disposition Plan: remains in stepdown; continue trending Hgb; maybe CLD if Hgb continue trending up. Will give IV iron and allow him to get OOB with assistance  Consultants:  GI  Cardiothoracic surgery  Cardiology curbside (Dr. Rayann Heman; recommended to follow 2-D echo and stabilize Hgb; no intervention anticipated at this time. Once stable can have Myoview as an outpatient)   Procedures:  See below for x-ray reports   2-D echo: pending  LE duplex: Bilateral lower extremities are negative for deep vein thrombosis. There is no evidence of Baker's cyst bilaterally.  Antibiotics:  Received Vanc and zosyn X 1 while in ED  HPI/Subjective: Afebrile, no CP and no SOB. Hemodynamically stable and with Hgb finally improving. CBC after last transfusion pending. No further bleeding appreciated.  Objective: Filed Vitals:   08/20/15 0600 08/20/15 0700  BP: 99/62 125/66  Pulse: 71 67  Temp:  98.6 F (37 C)  Resp: 15 20    Intake/Output Summary (Last 24 hours) at 08/20/15 0857 Last data filed at 08/20/15 0734  Gross per 24 hour  Intake   2815 ml  Output   2000 ml  Net    815 ml   Filed Weights   08/19/15 0356 08/20/15 0302  Weight: 108.2 kg (238 lb 8.6  oz) 108.9 kg (240 lb 1.3 oz)    Exam:   General:  Afebrile, no CP, no SOB. Patient's troponin up to 0.77; no further signs of active bleeding appreciated. Hgb finally responding and trending up. Denies abd pain, nausea, vomiting and no abnormalities on  CT scan done to r/o retroperitoneal bleed  Cardiovascular: S1 and S2, no rubs or gallops  Respiratory: good air movement, no wheezing or crackles  Abdomen: soft, NT, ND, positive BS  Musculoskeletal: no edema or cyanosis    Data Reviewed: Basic Metabolic Panel:  Recent Labs Lab 08/18/15 1215 08/19/15 0508  NA 139 143  K 4.5 3.9  CL 103 111  CO2 26 27  GLUCOSE 237* 70  BUN 42* 29*  CREATININE 1.30* 1.17  CALCIUM 8.7* 8.3*   Liver Function Tests:  Recent Labs Lab 08/18/15 1215  AST 19  ALT 15*  ALKPHOS 81  BILITOT 0.2*  PROT 5.9*  ALBUMIN 2.7*   CBC:  Recent Labs Lab 08/18/15 1215 08/19/15 0508 08/19/15 1100 08/19/15 1954 08/20/15 0046  WBC 8.5 6.5 6.8 5.9 6.0  NEUTROABS 6.9  --   --   --   --   HGB 4.4* 5.4* 6.7* 7.1* 7.4*  HCT 14.1* 16.9* 21.0* 21.7* 22.9*  MCV 99.3 95.5 94.6 92.7 93.5  PLT 197 161 156 126* 141*   Cardiac Enzymes:  Recent Labs Lab 08/18/15 2359 08/19/15 0508 08/19/15 1100 08/19/15 1827  TROPONINI 0.55* 0.63* 0.69* 0.77*   CBG:  Recent Labs Lab 08/19/15 0632 08/19/15 1245 08/19/15 1705 08/19/15 2305 08/20/15 0603  GLUCAP 70 100* 107* 127* 160*    Recent Results (from the past 240 hour(s))  Blood Culture (routine x 2)     Status: None (Preliminary result)   Collection Time: 08/18/15 12:15 PM  Result Value Ref Range Status   Specimen Description BLOOD RIGHT ANTECUBITAL  Final   Special Requests BOTTLES DRAWN AEROBIC AND ANAEROBIC 5CC  Final   Culture NO GROWTH < 24 HOURS  Final   Report Status PENDING  Incomplete  Blood Culture (routine x 2)     Status: None (Preliminary result)   Collection Time: 08/18/15 12:15 PM  Result Value Ref Range Status   Specimen Description BLOOD LEFT ANTECUBITAL  Final   Special Requests BOTTLES DRAWN AEROBIC AND ANAEROBIC 5CC  Final   Culture NO GROWTH < 24 HOURS  Final   Report Status PENDING  Incomplete  Urine culture     Status: None   Collection Time: 08/18/15  2:30 PM  Result  Value Ref Range Status   Specimen Description URINE, CATHETERIZED  Final   Special Requests NONE  Final   Culture NO GROWTH 1 DAY  Final   Report Status 08/19/2015 FINAL  Final  MRSA PCR Screening     Status: None   Collection Time: 08/18/15  4:55 PM  Result Value Ref Range Status   MRSA by PCR NEGATIVE NEGATIVE Final    Comment:        The GeneXpert MRSA Assay (FDA approved for NASAL specimens only), is one component of a comprehensive MRSA colonization surveillance program. It is not intended to diagnose MRSA infection nor to guide or monitor treatment for MRSA infections.      Studies: Dg Chest 1 View  08/18/2015  CLINICAL DATA:  Gastrointestinal bleeding. EXAM: CHEST 1 VIEW COMPARISON:  August 02, 2015. FINDINGS: The heart size and mediastinal contours are within normal limits. Both lungs are clear. No pneumothorax or pleural effusion is  noted. The visualized skeletal structures are unremarkable. IMPRESSION: No acute cardiopulmonary abnormality seen. Electronically Signed   By: Marijo Conception, M.D.   On: 08/18/2015 14:12   Dg Abd 1 View  08/18/2015  CLINICAL DATA:  GI bleed. Recent esophagectomy on 07/25/2015 for esophageal cancer. Fatigue and weakness. EXAM: ABDOMEN - 1 VIEW COMPARISON:  CT scan dated 07/20/2015 FINDINGS: There is large bore jejunostomy tube in the mid abdomen. Minimal air in nondistended bowel. No visible free air or free fluid. No acute osseous abnormality. IMPRESSION: No acute abnormalities. Jejunostomy tube in place with the tip in the mid right abdomen. Electronically Signed   By: Lorriane Shire M.D.   On: 08/18/2015 14:14   Ct Abdomen Pelvis W Contrast  08/20/2015  CLINICAL DATA:  Acute onset of gastroesophageal bleeding, with melena. Assess for source. Initial encounter. EXAM: CT ABDOMEN AND PELVIS WITH CONTRAST TECHNIQUE: Multidetector CT imaging of the abdomen and pelvis was performed using the standard protocol following bolus administration of  intravenous contrast. CONTRAST:  176mL OMNIPAQUE IOHEXOL 300 MG/ML  SOLN COMPARISON:  Abdominal radiograph from 08/18/2015, and CT of the abdomen and pelvis from 07/20/2015 FINDINGS: Trace bilateral pleural effusions are seen, with mild associated atelectasis. Relatively diffuse coronary artery calcification is noted. The patient is status post partial gastric resection due to known gastric malignancy. There is somewhat asymmetric irregular wall thickening along the body of the stomach, with an underlying moderate to large hiatal hernia. Though this may simply reflect relative decompression, recurrent or persistent malignancy cannot be excluded. PET/CT would be helpful for further evaluation. Diffuse mildly irregular wall thickening is noted along the visualized portions of the hernia. The liver and spleen are unremarkable in appearance. The gallbladder is within normal limits. The pancreas and adrenal glands are unremarkable. Bilateral renal cysts are seen, measuring up to 3.4 cm in size. Scattered calcifications at the renal hila are thought to be vascular in nature. There is no evidence of hydronephrosis. No renal or ureteral stones are seen. No perinephric stranding is appreciated. Postoperative omental inflammation is noted anteriorly. A J-tube is noted in expected position. The small bowel is largely decompressed. No acute vascular abnormalities are seen. Scattered calcification is noted along the abdominal aorta and its branches. The appendix is normal in caliber, without evidence of appendicitis. The colon is unremarkable in appearance. The bladder is relatively decompressed and contains a small amount of air, with a Foley catheter in place. The prostate is enlarged, measuring 5.2 cm in transverse dimension. No inguinal lymphadenopathy is seen. No acute osseous abnormalities are identified. IMPRESSION: 1. Status post partial gastric resection due to known gastric malignancy. Somewhat irregular asymmetric  wall thickening along the body of the stomach, with an underlying moderate to large hiatal hernia. Though this may simply reflect relative decompression, recurrent or persistent malignancy cannot be excluded. Diffuse mildly irregular wall thickening along the visualized portions of the hernia. PET/CT would be helpful for further evaluation. 2. Trace bilateral pleural effusions, with mild associated atelectasis. 3. Relatively diffuse coronary artery calcification noted. 4. Bilateral renal cysts seen. 5. Scattered calcification along the abdominal aorta and its branches. 6. Postoperative omental inflammation noted anteriorly. 7. Enlarged prostate seen. Electronically Signed   By: Garald Balding M.D.   On: 08/20/2015 02:55    Scheduled Meds: . antiseptic oral rinse  7 mL Mouth Rinse q12n4p  . chlorhexidine  15 mL Mouth Rinse BID  . insulin aspart  0-9 Units Subcutaneous TID WC  . [START ON 08/22/2015] pantoprazole (  PROTONIX) IV  40 mg Intravenous Q12H   Continuous Infusions: . dextrose 50 mL/hr at 08/20/15 0527  . pantoprozole (PROTONIX) infusion 8 mg/hr (08/20/15 0000)    Principal Problem:   Symptomatic anemia Active Problems:   Depression with anxiety   Essential hypertension   Type 1 diabetes mellitus with renal manifestations (HCC)   Esophageal cancer (HCC)   GI bleed   Pulmonary embolism (HCC)   HLD (hyperlipidemia)   GIB (gastrointestinal bleeding)   Gastrointestinal hemorrhage with melena   Anemia requiring transfusions   Hx pulmonary embolism   Elevated troponin    Time spent: 35 minutes    Barton Dubois  [facility] Pager [phone]. If 7PM-7AM, please contact night-coverage at [email or web address], password [email or web address] 08/20/2015, 8:57 AM  LOS: 2 days

## 2015-08-21 DIAGNOSIS — E1029 Type 1 diabetes mellitus with other diabetic kidney complication: Secondary | ICD-10-CM

## 2015-08-21 DIAGNOSIS — I5032 Chronic diastolic (congestive) heart failure: Secondary | ICD-10-CM

## 2015-08-21 DIAGNOSIS — F341 Dysthymic disorder: Secondary | ICD-10-CM

## 2015-08-21 LAB — TYPE AND SCREEN
ABO/RH(D): A POS
ANTIBODY SCREEN: NEGATIVE
UNIT DIVISION: 0
UNIT DIVISION: 0
UNIT DIVISION: 0
Unit division: 0
Unit division: 0

## 2015-08-21 LAB — CBC
HEMATOCRIT: 25.9 % — AB (ref 39.0–52.0)
HEMOGLOBIN: 8.4 g/dL — AB (ref 13.0–17.0)
MCH: 29.7 pg (ref 26.0–34.0)
MCHC: 32.4 g/dL (ref 30.0–36.0)
MCV: 91.5 fL (ref 78.0–100.0)
Platelets: 134 10*3/uL — ABNORMAL LOW (ref 150–400)
RBC: 2.83 MIL/uL — AB (ref 4.22–5.81)
RDW: 18 % — ABNORMAL HIGH (ref 11.5–15.5)
WBC: 5.3 10*3/uL (ref 4.0–10.5)

## 2015-08-21 LAB — GLUCOSE, CAPILLARY
GLUCOSE-CAPILLARY: 254 mg/dL — AB (ref 65–99)
GLUCOSE-CAPILLARY: 289 mg/dL — AB (ref 65–99)
Glucose-Capillary: 345 mg/dL — ABNORMAL HIGH (ref 65–99)
Glucose-Capillary: 264 mg/dL — ABNORMAL HIGH (ref 65–99)
Glucose-Capillary: 268 mg/dL — ABNORMAL HIGH (ref 65–99)
Glucose-Capillary: 178 mg/dL — ABNORMAL HIGH (ref 65–99)

## 2015-08-21 LAB — TROPONIN I: Troponin I: 0.48 ng/mL — ABNORMAL HIGH (ref <0.031)

## 2015-08-21 MED ORDER — SODIUM CHLORIDE 0.9 % IV SOLN: Freq: Once | INTRAVENOUS | Status: AC

## 2015-08-21 MED ORDER — POLYETHYLENE GLYCOL 3350 17 G PO PACK: 17.0000 g | PACK | Freq: Every day | ORAL | Status: DC | PRN

## 2015-08-21 MED ORDER — VITAL 1.5 CAL PO LIQD
1000.0000 mL | ORAL | Status: DC
  Administered 2015-08-21: 1000 mL
  Filled 2015-08-21 (×7): qty 1000

## 2015-08-21 MED ORDER — ENSURE ENLIVE PO LIQD
237.0000 mL | Freq: Two times a day (BID) | ORAL | Status: DC
Start: 2015-08-22 — End: 2015-08-22

## 2015-08-21 MED ORDER — INSULIN GLARGINE 100 UNIT/ML ~~LOC~~ SOLN
15.0000 [IU] | Freq: Every day | SUBCUTANEOUS | Status: DC
  Administered 2015-08-21: 15 [IU] via SUBCUTANEOUS
  Filled 2015-08-21 (×2): qty 0.15

## 2015-08-21 MED ORDER — VENLAFAXINE HCL ER 150 MG PO CP24
150.0000 mg | ORAL_CAPSULE | Freq: Every day | ORAL | Status: DC
Start: 2015-08-21 — End: 2015-08-22
  Administered 2015-08-21 – 2015-08-22 (×2): 150 mg via ORAL
  Filled 2015-08-21 (×2): qty 1

## 2015-08-21 MED ORDER — INSULIN ASPART 100 UNIT/ML ~~LOC~~ SOLN
0.0000 [IU] | SUBCUTANEOUS | Status: DC
  Administered 2015-08-21: 7 [IU] via SUBCUTANEOUS
  Administered 2015-08-21: 5 [IU] via SUBCUTANEOUS
  Administered 2015-08-21: 7 [IU] via SUBCUTANEOUS
  Administered 2015-08-21 (×2): 5 [IU] via SUBCUTANEOUS
  Administered 2015-08-21: 2 [IU] via SUBCUTANEOUS
  Administered 2015-08-21: 5 [IU] via SUBCUTANEOUS
  Administered 2015-08-22: 7 [IU] via SUBCUTANEOUS
  Administered 2015-08-22: 9 [IU] via SUBCUTANEOUS
  Administered 2015-08-22: 3 [IU] via SUBCUTANEOUS

## 2015-08-21 MED ORDER — SODIUM CHLORIDE 0.9 % IV SOLN
INTRAVENOUS | Status: DC
  Administered 2015-08-21: 09:00:00 via INTRAVENOUS

## 2015-08-21 NOTE — Progress Notes (Addendum)
      [address]       [place] [address]             [phone]         Procedure(s) (LRB): ESOPHAGOGASTRODUODENOSCOPY (EGD) (N/A)  Subjective: Patient without specific complaints this am. Denies nausea or abdominal pain.  Objective: Vital signs in last 24 hours: Temp:  [97.8 F (36.6 C)-98.5 F (36.9 C)] 98.1 F (36.7 C) (01/02 0758) Pulse Rate:  [72-86] 79 (01/02 0758) Cardiac Rhythm:  [-] Normal sinus rhythm (01/02 0841) Resp:  [18-21] 21 (01/02 0758) BP: (98-160)/(56-75) 140/73 mmHg (01/02 0758) SpO2:  [94 %-96 %] 94 % (01/02 0758) Weight:  [227 lb 15.3 oz (103.4 kg)] 227 lb 15.3 oz (103.4 kg) (01/02 0500)      Intake/Output from previous day: 01/01 0701 - 01/02 0700 In: 2244.5 [I.V.:1337; NG/GT:907.5] Out: 3552 [Urine:3550; Stool:2]   Physical Exam:  Cardiovascular: RRR Pulmonary: Clear to auscultation bilaterally Abdomen: Soft, non tender, bowel sounds present.  J tube site clean and dry. Wounds: Clean and dry.  No erythema or signs of infection.   Lab Results: CBC: Recent Labs  08/20/15 0845 08/21/15 0531  WBC 5.4 5.3  HGB 8.6* 8.4*  HCT 26.2* 25.9*  PLT 128* 134*   BMET:  Recent Labs  08/18/15 1215 08/19/15 0508  NA 139 143  K 4.5 3.9  CL 103 111  CO2 26 27  GLUCOSE 237* 70  BUN 42* 29*  CREATININE 1.30* 1.17  CALCIUM 8.7* 8.3*    PT/INR:  Recent Labs  08/19/15 1100  LABPROT 14.0  INR 1.06   ABG:  INR: Will add last result for INR, ABG once components are confirmed Will add last 4 CBG results once components are confirmed  Assessment/Plan:  1. CV - SR in the 70's. 2.  Anemia-Xarelto has been stopped since 12/30. H and H remains stable at 8.4 and 25.9 3.GI-as discussed with Dr. Prescott Gum, will increase tube feedings, start clear liquids, and give Ensure in am. Continue Protonix. 4. DM-CBGs 340/345/264. On Insulin which needs to be adjusted. HGA1C 8. Per medicine. 5. Depression with anxiety. Will  restart Effexor XR as taken prior to admission.   ZIMMERMAN,DONIELLE MPA-C 08/21/2015,8:50 AM  GI bleeding resolved after Xarelta stopped Start po clear liqs and po meds He has lost wt since esophagectomy  patient examined and medical record reviewed,agree with above note. Tharon Aquas Trigt III 08/21/2015

## 2015-08-21 NOTE — Care Management Important Message (Signed)
Important Message  Patient Details  Name: Ronald Johnson MRN: [id number] Date of Birth: 1946/08/03   Medicare Important Message Given:  Yes    Louanne Belton 08/21/2015, 12:23 [place] Message  Patient Details  Name: Ronald Johnson MRN: [id number] Date of Birth: Nov 02, 1945   Medicare Important Message Given:  Yes    Rex Oesterle, Neal Dy 08/21/2015, 12:23 PM

## 2015-08-21 NOTE — Progress Notes (Signed)
Utilization review completed.  

## 2015-08-21 NOTE — Progress Notes (Signed)
Progress Note   Subjective  Patient reports feeling well today. No blood per rectum. CBC stable. No vomiting. Tolerating liquids   Objective   Vital signs in last 24 hours: Temp:  [97.8 F (36.6 C)-98.4 F (36.9 C)] 98.1 F (36.7 C) (01/02 1122) Pulse Rate:  [68-86] 68 (01/02 1122) Resp:  [18-21] 19 (01/02 1122) BP: (101-160)/(53-75) 101/53 mmHg (01/02 1122) SpO2:  [94 %-98 %] 98 % (01/02 1122) Weight:  [227 lb 15.3 oz (103.4 kg)] 227 lb 15.3 oz (103.4 kg) (01/02 0500) Last BM Date: 08/21/15 General:    white male in NAD Heart:  Regular rate and rhythm; no murmurs Lungs: Respirations even and unlabored, lungs CTA bilaterally Abdomen:  Soft, nontender and nondistended. Normal bowel sounds. Extremities:  Without edema. Neurologic:  Alert and oriented,  grossly normal neurologically. Psych:  Cooperative. Normal mood and affect.  Intake/Output from previous day: 01/01 0701 - 01/02 0700 In: 2244.5 [I.V.:1337; NG/GT:907.5] Out: 3552 [Urine:3550; Stool:2] Intake/Output this shift: Total I/O In: 360 [P.O.:360] Out: 375 [Urine:375]  Lab Results:  Recent Labs  08/20/15 0046 08/20/15 0845 08/21/15 0531  WBC 6.0 5.4 5.3  HGB 7.4* 8.6* 8.4*  HCT 22.9* 26.2* 25.9*  PLT 141* 128* 134*   BMET  Recent Labs  08/19/15 0508  NA 143  K 3.9  CL 111  CO2 27  GLUCOSE 70  BUN 29*  CREATININE 1.17  CALCIUM 8.3*   LFT No results for input(s): PROT, ALBUMIN, AST, ALT, ALKPHOS, BILITOT, BILIDIR, IBILI in the last 72 hours. PT/INR  Recent Labs  08/19/15 1100  LABPROT 14.0  INR 1.06    Studies/Results: Ct Abdomen Pelvis W Contrast  08/20/2015  CLINICAL DATA:  Acute onset of gastroesophageal bleeding, with melena. Assess for source. Initial encounter. EXAM: CT ABDOMEN AND PELVIS WITH CONTRAST TECHNIQUE: Multidetector CT imaging of the abdomen and pelvis was performed using the standard protocol following bolus administration of intravenous contrast. CONTRAST:  164mL  OMNIPAQUE IOHEXOL 300 MG/ML  SOLN COMPARISON:  Abdominal radiograph from 08/18/2015, and CT of the abdomen and pelvis from 07/20/2015 FINDINGS: Trace bilateral pleural effusions are seen, with mild associated atelectasis. Relatively diffuse coronary artery calcification is noted. The patient is status post partial gastric resection due to known gastric malignancy. There is somewhat asymmetric irregular wall thickening along the body of the stomach, with an underlying moderate to large hiatal hernia. Though this may simply reflect relative decompression, recurrent or persistent malignancy cannot be excluded. PET/CT would be helpful for further evaluation. Diffuse mildly irregular wall thickening is noted along the visualized portions of the hernia. The liver and spleen are unremarkable in appearance. The gallbladder is within normal limits. The pancreas and adrenal glands are unremarkable. Bilateral renal cysts are seen, measuring up to 3.4 cm in size. Scattered calcifications at the renal hila are thought to be vascular in nature. There is no evidence of hydronephrosis. No renal or ureteral stones are seen. No perinephric stranding is appreciated. Postoperative omental inflammation is noted anteriorly. A J-tube is noted in expected position. The small bowel is largely decompressed. No acute vascular abnormalities are seen. Scattered calcification is noted along the abdominal aorta and its branches. The appendix is normal in caliber, without evidence of appendicitis. The colon is unremarkable in appearance. The bladder is relatively decompressed and contains a small amount of air, with a Foley catheter in place. The prostate is enlarged, measuring 5.2 cm in transverse dimension. No inguinal lymphadenopathy is seen. No acute osseous abnormalities  are identified. IMPRESSION: 1. Status post partial gastric resection due to known gastric malignancy. Somewhat irregular asymmetric wall thickening along the body of the  stomach, with an underlying moderate to large hiatal hernia. Though this may simply reflect relative decompression, recurrent or persistent malignancy cannot be excluded. Diffuse mildly irregular wall thickening along the visualized portions of the hernia. PET/CT would be helpful for further evaluation. 2. Trace bilateral pleural effusions, with mild associated atelectasis. 3. Relatively diffuse coronary artery calcification noted. 4. Bilateral renal cysts seen. 5. Scattered calcification along the abdominal aorta and its branches. 6. Postoperative omental inflammation noted anteriorly. 7. Enlarged prostate seen. Electronically Signed   By: Garald Balding M.D.   On: 08/20/2015 02:55       Assessment / Plan:   70 y/o male with history of recent esophagectomy for esophageal cancer, on Xarelto for history of PE, who presented with a suspected upper GI bleed with Hgb of 4.4, after a few days of symptoms. He has been on IV PPI and received several units of PRBC thus far. Xarelto reversed with K centra. Troponin elevation likely demand ischemia from severe anemia.   Over time his bleeding stopped without intervention. CBC stable overnight, no further blood per rectum. We will avoid not perform upper endoscopy at this time due to the fresh anastomosis and risk of tearing / bleeding at the site. Continue IV PPI, on clears now.   I will sign off at this time, please contact me with questions or if he has any evidence of recurrent bleeding so we can reassess him.   [place] Cellar, MD [facility] Pager [phone]   Principal Problem:   Symptomatic anemia Active Problems:   Depression with anxiety   Essential hypertension   Type 1 diabetes mellitus with renal manifestations ([place])   Esophageal cancer (HCC)   GI bleed   Pulmonary embolism (HCC)   HLD (hyperlipidemia)   GIB (gastrointestinal bleeding)   Gastrointestinal hemorrhage with melena   Anemia requiring transfusions   Hx  pulmonary embolism   Elevated troponin   Chronic diastolic heart failure ([place])     LOS: 3 days   Renelda Loma Armbruster  08/21/2015, 2:37 PM

## 2015-08-21 NOTE — Progress Notes (Addendum)
[facility] PROGRESS NOTE  Ronald Johnson [id number] DOB: [birth year] DOA: 08/18/2015 PCP: [name], MD  Assessment/Plan: 1-gastrointestinal bleeding with melena: Concern for upper GI bleed -Patient will be transfer to telemetry bed -will continue PPI IV for another 24 hours -a total of 5 units of blood transfused; one more pending for today -Will advance die tto full liquid; follow hemoglobin in am -GI has been consulted and at this moment given risk of tearing fresh anastomosis and the fact the patient is stabilizing and without signs of further bleeding, will hold on any endoscopic procedure for now.    2-Symptomatic anemia: acute blood loss on chronic anemia -has received a total of 5 units of PRBC's -will also give IV iron -follow Hgb trend and transfuse further as needed; goal is for Hgb in the 8 range. Currently 8.4 (08/21/15) -on admission hemoglobin of 4.4  -following rec's from Cardiothoracic surgeons patient received K-Centra on 12/31  3-Depression with anxiety: PO meds on hold for now -will continue PRN ativan  4-Essential hypertension: -stable and well controlled currently -given acute GIB will continue holding antihypertensive regimen and monitor  5-Type 1 diabetes mellitus with renal manifestations ([place]) -will hold home NPH -will use SSI and will add lantus -CBG's up with Tube Feedings  -A1C 8.0  6-Esophageal cancer [facility]): s/p resection; healing well and doing ok -cardiothoracic surgery following him; will follow rec's -Tube feedings resumed on 08/20/15 (so far well tolerated)  7-Pulmonary embolism ([place]):  -anticoagulation discontinued due to active bleeding -Patient has been on medications for almost a whole month -no CP, no SOB and good O2 sat on RA  -LE duplex neg for DVT  8-HLD (hyperlipidemia): will continue holding statins while NPO  9-mild lactic acidosis:  -due to anemia and decrease perfusion  -no signs of infection on CXR or  UA -will continue monitoring off antibiotics -repeated lactic acid 0.6 (resolved)  10-CKD stage 2-3:  -Cr essentially at baseline -will follow trend -avoid hypotension -holding nephrotoxic agents   11-abnormal EKG: lateral leads with some ST depression and mild elevation on troponin -discussed with cardiology and recommended: -Stabilize Hgb (with plans to keep it above 8.0) -most likely demand ischemia -2-D reassuring  -no CP and no SOB -Outpatient Myoview once stable. -last peaked troponin 0.77  12. Chronic grade 2 diastolic HF: most likely associated with HTN -no SOB -no signs of fluid overload -advise to follow low sodium diet at discharge -continue BP control   Code Status: Full Family Communication: no family at bedside  Disposition Plan: will transfer to telemetry bed; continue trending Hgb; will advance to full liquid diet. OOB with assistance  Consultants:  GI  Cardiothoracic surgery  Cardiology curbside (Dr. Rayann Heman; recommended to follow 2-D echo and stabilize Hgb; no intervention anticipated at this time. Once stable can have Myoview as an outpatient)   Procedures:  See below for x-ray reports   2-D echo:  - Left ventricle: The cavity size was normal. Wall thickness was increased in a pattern of mild LVH. Systolic function was vigorous. The estimated ejection fraction was in the range of 65% to 70%. Wall motion was normal; there were no regional wall motion abnormalities. Features are consistent with a pseudonormal left ventricular filling pattern, with concomitant abnormal relaxation and increased filling pressure (grade 2 diastolic dysfunction). - Aortic valve: Peak velocity ratio of LVOT to aortic valve: 0.68. Valve area (Vmax): 2.84 cm^2. - Left atrium: The atrium was mildly dilated. - Right atrium: Central venous pressure (est): 3  mm Hg. - Tricuspid valve: There was trivial regurgitation. - Pulmonary arteries: Systolic pressure  could not be accurately estimated. - Pericardium, extracardiac: There was no pericardial effusion.  Impressions: - Mild LVH with LVEF 65-70% and grade 2 diastolic dysfunction. Mild left atrial enlargement. Sclerotic aortic valve. Trivial tricuspid regurgitation.   LE duplex: Bilateral lower extremities are negative for deep vein thrombosis. There is no evidence of Baker's cyst bilaterally.  Antibiotics:  Received Vanc and zosyn X 1 while in ED  HPI/Subjective: Afebrile, no CP and no SOB. Hemodynamically stable and with Hgb up to 8.4 (24 hours after last transfusion)  Objective: Filed Vitals:   08/21/15 0603 08/21/15 0758  BP: 160/75   Pulse: 86   Temp:  98.1 F (36.7 C)  Resp: 20     Intake/Output Summary (Last 24 hours) at 08/21/15 0808 Last data filed at 08/21/15 0700  Gross per 24 hour  Intake 2244.5 ml  Output   3102 ml  Net -857.5 ml   Filed Weights   08/19/15 0356 08/20/15 0302 08/21/15 0500  Weight: 108.2 kg (238 lb 8.6 oz) 108.9 kg (240 lb 1.3 oz) 103.4 kg (227 lb 15.3 oz)    Exam:   General:  Afebrile, no CP, no SOB. Patient w/o any further bleeding. Tolerating TF's. No abd pain, no nausea, no vomiting   Cardiovascular: S1 and S2, no rubs or gallops  Respiratory: good air movement, no wheezing or crackles  Abdomen: soft, NT, ND, positive BS  Musculoskeletal: no edema or cyanosis    Data Reviewed: Basic Metabolic Panel:  Recent Labs Lab 08/18/15 1215 08/19/15 0508  NA 139 143  K 4.5 3.9  CL 103 111  CO2 26 27  GLUCOSE 237* 70  BUN 42* 29*  CREATININE 1.30* 1.17  CALCIUM 8.7* 8.3*   Liver Function Tests:  Recent Labs Lab 08/18/15 1215  AST 19  ALT 15*  ALKPHOS 81  BILITOT 0.2*  PROT 5.9*  ALBUMIN 2.7*   CBC:  Recent Labs Lab 08/18/15 1215  08/19/15 1100 08/19/15 1954 08/20/15 0046 08/20/15 0845 08/21/15 0531  WBC 8.5  < > 6.8 5.9 6.0 5.4 5.3  NEUTROABS 6.9  --   --   --   --   --   --   HGB 4.4*  < > 6.7* 7.1*  7.4* 8.6* 8.4*  HCT 14.1*  < > 21.0* 21.7* 22.9* 26.2* 25.9*  MCV 99.3  < > 94.6 92.7 93.5 91.6 91.5  PLT 197  < > 156 126* 141* 128* 134*  < > = values in this interval not displayed. Cardiac Enzymes:  Recent Labs Lab 08/18/15 2359 08/19/15 0508 08/19/15 1100 08/19/15 1827  TROPONINI 0.55* 0.63* 0.69* 0.77*   CBG:  Recent Labs Lab 08/20/15 0603 08/20/15 1247 08/20/15 1513 08/20/15 2300 08/21/15 0413  GLUCAP 160* 187* 285* 340* 345*    Recent Results (from the past 240 hour(s))  Blood Culture (routine x 2)     Status: None (Preliminary result)   Collection Time: 08/18/15 12:15 PM  Result Value Ref Range Status   Specimen Description BLOOD RIGHT ANTECUBITAL  Final   Special Requests BOTTLES DRAWN AEROBIC AND ANAEROBIC 5CC  Final   Culture NO GROWTH 2 DAYS  Final   Report Status PENDING  Incomplete  Blood Culture (routine x 2)     Status: None (Preliminary result)   Collection Time: 08/18/15 12:15 PM  Result Value Ref Range Status   Specimen Description BLOOD LEFT ANTECUBITAL  Final  Special Requests BOTTLES DRAWN AEROBIC AND ANAEROBIC 5CC  Final   Culture NO GROWTH 2 DAYS  Final   Report Status PENDING  Incomplete  Urine culture     Status: None   Collection Time: 08/18/15  2:30 PM  Result Value Ref Range Status   Specimen Description URINE, CATHETERIZED  Final   Special Requests NONE  Final   Culture NO GROWTH 1 DAY  Final   Report Status 08/19/2015 FINAL  Final  MRSA PCR Screening     Status: None   Collection Time: 08/18/15  4:55 PM  Result Value Ref Range Status   MRSA by PCR NEGATIVE NEGATIVE Final    Comment:        The GeneXpert MRSA Assay (FDA approved for NASAL specimens only), is one component of a comprehensive MRSA colonization surveillance program. It is not intended to diagnose MRSA infection nor to guide or monitor treatment for MRSA infections.      Studies: Ct Abdomen Pelvis W Contrast  08/20/2015  CLINICAL DATA:  Acute onset of  gastroesophageal bleeding, with melena. Assess for source. Initial encounter. EXAM: CT ABDOMEN AND PELVIS WITH CONTRAST TECHNIQUE: Multidetector CT imaging of the abdomen and pelvis was performed using the standard protocol following bolus administration of intravenous contrast. CONTRAST:  12mL OMNIPAQUE IOHEXOL 300 MG/ML  SOLN COMPARISON:  Abdominal radiograph from 08/18/2015, and CT of the abdomen and pelvis from 07/20/2015 FINDINGS: Trace bilateral pleural effusions are seen, with mild associated atelectasis. Relatively diffuse coronary artery calcification is noted. The patient is status post partial gastric resection due to known gastric malignancy. There is somewhat asymmetric irregular wall thickening along the body of the stomach, with an underlying moderate to large hiatal hernia. Though this may simply reflect relative decompression, recurrent or persistent malignancy cannot be excluded. PET/CT would be helpful for further evaluation. Diffuse mildly irregular wall thickening is noted along the visualized portions of the hernia. The liver and spleen are unremarkable in appearance. The gallbladder is within normal limits. The pancreas and adrenal glands are unremarkable. Bilateral renal cysts are seen, measuring up to 3.4 cm in size. Scattered calcifications at the renal hila are thought to be vascular in nature. There is no evidence of hydronephrosis. No renal or ureteral stones are seen. No perinephric stranding is appreciated. Postoperative omental inflammation is noted anteriorly. A J-tube is noted in expected position. The small bowel is largely decompressed. No acute vascular abnormalities are seen. Scattered calcification is noted along the abdominal aorta and its branches. The appendix is normal in caliber, without evidence of appendicitis. The colon is unremarkable in appearance. The bladder is relatively decompressed and contains a small amount of air, with a Foley catheter in place. The prostate  is enlarged, measuring 5.2 cm in transverse dimension. No inguinal lymphadenopathy is seen. No acute osseous abnormalities are identified. IMPRESSION: 1. Status post partial gastric resection due to known gastric malignancy. Somewhat irregular asymmetric wall thickening along the body of the stomach, with an underlying moderate to large hiatal hernia. Though this may simply reflect relative decompression, recurrent or persistent malignancy cannot be excluded. Diffuse mildly irregular wall thickening along the visualized portions of the hernia. PET/CT would be helpful for further evaluation. 2. Trace bilateral pleural effusions, with mild associated atelectasis. 3. Relatively diffuse coronary artery calcification noted. 4. Bilateral renal cysts seen. 5. Scattered calcification along the abdominal aorta and its branches. 6. Postoperative omental inflammation noted anteriorly. 7. Enlarged prostate seen. Electronically Signed   By: Francoise Schaumann.D.  On: 08/20/2015 02:55    Scheduled Meds: . sodium chloride   Intravenous Once  . antiseptic oral rinse  7 mL Mouth Rinse q12n4p  . chlorhexidine  15 mL Mouth Rinse BID  . insulin aspart  0-9 Units Subcutaneous 6 times per day  . insulin glargine  15 Units Subcutaneous Daily  . [START ON 08/22/2015] pantoprazole (PROTONIX) IV  40 mg Intravenous Q12H   Continuous Infusions: . sodium chloride    . feeding supplement (VITAL 1.5 CAL) 1,000 mL (08/20/15 1251)  . pantoprozole (PROTONIX) infusion 8 mg/hr (08/20/15 2122)    Principal Problem:   Symptomatic anemia Active Problems:   Depression with anxiety   Essential hypertension   Type 1 diabetes mellitus with renal manifestations ([facility])   Esophageal cancer ([facility])   GI bleed   Pulmonary embolism ([facility])   HLD (hyperlipidemia)   GIB (gastrointestinal bleeding)   Gastrointestinal hemorrhage with melena   Anemia requiring transfusions   Hx pulmonary embolism   Elevated troponin    Time spent: 35  minutes    Barton Dubois  [facility] Pager [phone]. If 7PM-7AM, please contact night-coverage at [email or web address], password [facility] 08/21/2015, 8:08 AM  LOS: 3 days

## 2015-08-21 NOTE — Progress Notes (Signed)
Report called to [place] Ar, Therapist, sports for 2 [employer].

## 2015-08-22 DIAGNOSIS — E0865 Diabetes mellitus due to underlying condition with hyperglycemia: Secondary | ICD-10-CM

## 2015-08-22 DIAGNOSIS — E0859 Diabetes mellitus due to underlying condition with other circulatory complications: Secondary | ICD-10-CM | POA: Insufficient documentation

## 2015-08-22 DIAGNOSIS — IMO0002 Reserved for concepts with insufficient information to code with codable children: Secondary | ICD-10-CM | POA: Insufficient documentation

## 2015-08-22 LAB — CBC
HEMATOCRIT: 27.3 % — AB (ref 39.0–52.0)
HEMOGLOBIN: 9.2 g/dL — AB (ref 13.0–17.0)
MCH: 31.7 pg (ref 26.0–34.0)
MCHC: 33.7 g/dL (ref 30.0–36.0)
MCV: 94.1 fL (ref 78.0–100.0)
Platelets: 132 10*3/uL — ABNORMAL LOW (ref 150–400)
RBC: 2.9 MIL/uL — ABNORMAL LOW (ref 4.22–5.81)
RDW: 18.2 % — AB (ref 11.5–15.5)
WBC: 5.5 10*3/uL (ref 4.0–10.5)

## 2015-08-22 LAB — GLUCOSE, CAPILLARY
GLUCOSE-CAPILLARY: 328 mg/dL — AB (ref 65–99)
Glucose-Capillary: 237 mg/dL — ABNORMAL HIGH (ref 65–99)
Glucose-Capillary: 361 mg/dL — ABNORMAL HIGH (ref 65–99)

## 2015-08-22 MED ORDER — ESOMEPRAZOLE MAGNESIUM 40 MG PO CPDR: 40.0000 mg | DELAYED_RELEASE_CAPSULE | Freq: Two times a day (BID) | ORAL | Status: DC

## 2015-08-22 MED ORDER — PANTOPRAZOLE SODIUM 40 MG PO TBEC
40.0000 mg | DELAYED_RELEASE_TABLET | Freq: Two times a day (BID) | ORAL | Status: DC
  Administered 2015-08-22: 40 mg via ORAL
  Filled 2015-08-22: qty 1

## 2015-08-22 MED ORDER — INSULIN GLARGINE 100 UNIT/ML ~~LOC~~ SOLN
22.0000 [IU] | Freq: Every day | SUBCUTANEOUS | Status: DC
Start: 2015-08-22 — End: 2015-08-22
  Administered 2015-08-22: 22 [IU] via SUBCUTANEOUS
  Filled 2015-08-22: qty 0.22

## 2015-08-22 MED ORDER — METHOCARBAMOL 500 MG PO TABS: 500.0000 mg | ORAL_TABLET | Freq: Three times a day (TID) | ORAL | Status: DC | PRN

## 2015-08-22 NOTE — Progress Notes (Addendum)
      [address]       [place] [address]             [phone]         Procedure(s) (LRB): ESOPHAGOGASTRODUODENOSCOPY (EGD) (N/A)  Subjective: Patient without specific complaints this am. Denies nausea or abdominal pain. He hopes to go home soon.  Objective: Vital signs in last 24 hours: Temp:  [97.5 F (36.4 C)-98.1 F (36.7 C)] 97.5 F (36.4 C) (01/03 0437) Pulse Rate:  [68-79] 78 (01/03 0437) Cardiac Rhythm:  [-] Normal sinus rhythm (01/03 0700) Resp:  [18-19] 18 (01/03 0437) BP: (101-137)/(53-67) 137/62 mmHg (01/03 0437) SpO2:  [94 %-98 %] 95 % (01/03 0437) Weight:  [228 lb 3.2 oz (103.511 kg)] 228 lb 3.2 oz (103.511 kg) (01/03 0437)      Intake/Output from previous day: 01/02 0701 - 01/03 0700 In: 360 [P.O.:360] Out: 575 [Urine:575]   Physical Exam:  Cardiovascular: RRR Pulmonary: Clear to auscultation bilaterally Abdomen: Soft, non tender, bowel sounds present.  J tube site clean and dry. Wounds: Clean and dry.  No erythema or signs of infection.   Lab Results: CBC:  Recent Labs  08/21/15 0531 08/22/15 0310  WBC 5.3 5.5  HGB 8.4* 9.2*  HCT 25.9* 27.3*  PLT 134* 132*   BMET: No results for input(s): NA, K, CL, CO2, GLUCOSE, BUN, CREATININE, CALCIUM in the last 72 hours.  PT/INR:   Recent Labs  08/19/15 1100  LABPROT 14.0  INR 1.06   ABG:  INR: Will add last result for INR, ABG once components are confirmed Will add last 4 CBG results once components are confirmed  Assessment/Plan:  1. CV - SR in the 70's. 2.  Anemia-Xarelto has been stopped since 12/30. H and H remains stable at 9.2 and 27.3 3.GI-tolerating liquids.Will give Ensure in am. Continue Protonix. 4. DM-CBGs 289/254/237. On Insulin which needs to be adjusted. HGA1C 8. Per medicine. 5. Depression with anxiety-continue with Effexor XR as taken prior to admission.   ZIMMERMAN,DONIELLE MPA-C 08/22/2015,8:00 AM   Wounds all well healed Feels well No  further bleeding No clot in legs Home today on tube feeding part of day See me in office Jan12, office will call with time, I have told patient I have seen and examined Ronald Johnson and agree with the above assessment  and plan.  [name] MD [facility] [phone] Office [phone] 08/22/2015 12:11 PM

## 2015-08-22 NOTE — Care Management Note (Addendum)
Case Management Note  Patient Details  Name: Ronald Johnson MRN: [id number] Date of Birth: 1946-02-04  Subjective/Objective:     Pt was admitted with symptomatic anemia               Action/Plan:  Pt is independent from home with wife.  Pt already has [facility] HHRN to assist with tube feeds.  CM contacted agency and informed of pt pending discharge today 08/22/15.   Expected Discharge Date:                  Expected Discharge Plan:  [place]  In-House Referral:     Discharge planning Services  CM Consult  Post Acute Care Choice:    Choice offered to:  Patient  DME Arranged:  Tube feeding pump, Tube feeding DME Agency:  [place]:  RN [facility] Agency:  [place]  Status of Service:  Completed, signed off  Medicare Important Message Given:  Yes Date Medicare IM Given:    Medicare IM give by:    Date Additional Medicare IM Given:    Additional Medicare Important Message give by:     If discussed at [place] of Stay Meetings, dates discussed:    Additional Comments: CM assessed pt with wife at bedside.  Pt acknowledged currently using tube feeds at home.  CM will ask MD for resumption orders for [employer].  Per pt and wife, they have ample supplies and equipment at home to continue with tube feeds. Maryclare Labrador, RN 08/22/2015, 9:55 AM

## 2015-08-22 NOTE — Progress Notes (Signed)
D/C instructions reviewed with pt and spouse.  Questions addressed and understanding expressed by both parties. Pt dressed and discharged off the unit by volunteer transport via w/c

## 2015-08-22 NOTE — Discharge Summary (Signed)
Physician Discharge Summary  Ronald Johnson [id number] DOB: [birth year] DOA: 08/18/2015  PCP: [name], MD  Admit date: 08/18/2015 Discharge date: 08/22/2015  Time spent: 35 minutes  Recommendations for Outpatient Follow-up:  1. Repeat CBC to follow Hgb trend    Discharge Diagnoses:  Principal Problem:   Symptomatic anemia Active Problems:   Depression with anxiety   Essential hypertension   Type 1 diabetes mellitus with renal manifestations (HCC)   Esophageal cancer (HCC)   GI bleed   Pulmonary embolism (HCC)   HLD (hyperlipidemia)   GIB (gastrointestinal bleeding)   Gastrointestinal hemorrhage with melena   Anemia requiring transfusions   Hx pulmonary embolism   Elevated troponin   Chronic diastolic heart failure ([place])   Discharge Condition: stable and improved. Discharge home and no further signs of bleeding appreciated. Will follow up with cardiothoracic surgery service on 08/30/14  Diet recommendation: heart healthy/low carb and soft diet  Filed Weights   08/20/15 0302 08/21/15 0500 08/22/15 0437  Weight: 108.9 kg (240 lb 1.3 oz) 103.4 kg (227 lb 15.3 oz) 103.511 kg (228 lb 3.2 oz)    History of present illness:  70 y.o. male with a past medical history significant for hypertension, adenocarcinoma of the esophagus (status post chemoradiation and resection), pulmonary embolism, diabetes on chronic insulin, chronic kidney disease stage 2-3 and GERD; who presented to the hospital secondary to increased weakness, fatigue tiredness sensation and bloody stools. Patient endorses some shortness of breath on activity and the family has reported some improvement in his stools. They express [place]: Intermittently and since he was on anticoagulation for his PE they initially thought that it was expected. Patient become significantly weak and was having so much symptoms with activity that his wife decided to bring him to the emergency department for further evaluation and  treatment. In the ED he was found to be very pale, mildly tachycardic and with soft blood pressure. His lactic acid was mildly elevated has elevated BUN and his hemoglobin was found to be 4.4. Melanotic stools on rectal exam was appreciated. [facility] hospitalist was called to admit the patient for further evaluation and treatment.  Of note, he denies chest pain, fever, cough, abdominal pain, dysuria, nausea, vomiting or any other acute complaints.  Hospital Course:  1-gastrointestinal bleeding with melena: Concern for upper GI bleed -Patient will be discharge home -will continue PPI PO BID -a total of 5 units of blood transfused during this admission -also received IV Iron -Will follow soft diet and tube feedings -GI was consulted but no endoscopic procedures or interventions required.   2-Symptomatic anemia: acute blood loss on chronic anemia -has received a total of 5 units of PRBC's -will also give IV iron -follow Hgb trend and transfuse further as needed; goal is for Hgb in the 8 range. Currently at discharge 9.2 (08/22/15) -on admission hemoglobin of 4.4  -following rec's from Cardiothoracic surgeons patient received K-Centra on 12/31  3-Depression with anxiety:  -will resume home medication regimen  4-Essential hypertension: -stable  -will resume home medication regimen  5-Type 1 diabetes mellitus with renal manifestations ([place]) -will resume home NPH regimen at discharge -CBG's up with Tube Feedings  -A1C 8.0  6-Esophageal cancer [facility]): s/p resection; healing well and doing ok -cardiothoracic surgery following him; will follow rec's -Tube feedings resumed on 08/20/15 (so far well tolerated)  7-Pulmonary embolism ([place]):  -anticoagulation discontinued due to active bleeding -Patient has been on medications for almost a whole month -no CP, no SOB and good  O2 sat on RA  -LE duplex neg for DVT  8-HLD (hyperlipidemia): will continue holding statins while NPO  9-mild  lactic acidosis:  -due to anemia and decrease perfusion  -no signs of infection on CXR or UA -will continue monitoring off antibiotics -repeated lactic acid 0.6 (resolved)  10-CKD stage 2-3:  -Cr essentially at baseline -will follow trend -avoid hypotension -holding nephrotoxic agents   11-abnormal EKG: lateral leads with some ST depression and mild elevation on troponin -discussed with cardiology and recommended: -Stabilize Hgb (with plans to keep it above 8.0) -most likely demand ischemia -2-D reassuring  -no CP and no SOB -Outpatient Myoview once stable. -last peaked troponin 0.77 and subsequently trended down.  12. Chronic grade 2 diastolic HF: most likely associated with HTN -no SOB -no signs of fluid overload -advise to follow low sodium diet at discharge -continue BP control   13. Low back pain: -MSK in origin -advise to use PRN robaxin -instructed to avoid NSAID's  Procedures:  See below for x-ray reports   2-D echo: - Left ventricle: The cavity size was normal. Wall thickness was increased in a pattern of mild LVH. Systolic function was vigorous. The estimated ejection fraction was in the range of 65% to 70%. Wall motion was normal; there were no regional wall motion abnormalities. Features are consistent with a pseudonormal left ventricular filling pattern, with concomitant abnormal relaxation and increased filling pressure (grade 2 diastolic dysfunction). - Aortic valve: Peak velocity ratio of LVOT to aortic valve: 0.68. Valve area (Vmax): 2.84 cm^2. - Left atrium: The atrium was mildly dilated. - Right atrium: Central venous pressure (est): 3 mm Hg. - Tricuspid valve: There was trivial regurgitation. - Pulmonary arteries: Systolic pressure could not be accurately estimated. - Pericardium, extracardiac: There was no pericardial effusion.  Impressions: - Mild LVH with LVEF 65-70% and grade 2 diastolic dysfunction. Mild left  atrial enlargement. Sclerotic aortic valve. Trivial tricuspid regurgitation.   LE duplex: Bilateral lower extremities are negative for deep vein thrombosis. There is no evidence of Baker's cyst bilaterally.  Consultations:  GI  Cardiothoracic surgery  Cardiology curbside (Dr. Rayann Heman; recommended to follow 2-D echo and stabilize Hgb; no intervention anticipated at this time. Once stable can have Myoview as an outpatient)  Discharge Exam: Filed Vitals:   08/21/15 1955 08/22/15 0437  BP: 131/67 137/62  Pulse: 79 78  Temp: 97.7 F (36.5 C) 97.5 F (36.4 C)  Resp: 18 18    General: Afebrile, no CP, no SOB. Patient w/o any further bleeding. Tolerating TF's and full liquid diet. No abd pain, no nausea, no vomiting   Cardiovascular: S1 and S2, no rubs or gallops  Respiratory: good air movement, no wheezing or crackles  Abdomen: soft, NT, ND, positive BS  Musculoskeletal: no edema or cyanosis   Discharge Instructions   Discharge Instructions    Diet - low sodium heart healthy    Complete by:  As directed      Discharge instructions    Complete by:  As directed   Keep yourself well hydrated Take medications as prescribed No further xarelto and no use of NSAIDs Your nexium has been adjusted to twice a day Follow a soft low carbohydrates diet Arrange follow up with PCP in 10 days Contact Cardiothoracic surgery office to confirm follow up appointment details          Current Discharge Medication List    START taking these medications   Details  methocarbamol (ROBAXIN) 500 MG tablet Take 1  tablet (500 mg total) by mouth every 8 (eight) hours as needed for muscle spasms. Qty: 35 tablet, Refills: 0      CONTINUE these medications which have CHANGED   Details  esomeprazole (NEXIUM) 40 MG capsule Take 1 capsule (40 mg total) by mouth 2 (two) times daily before a meal. Qty: 60 capsule, Refills: 1      CONTINUE these medications which have NOT CHANGED   Details   atorvastatin (LIPITOR) 10 MG tablet Take 1 tablet by mouth  every day Qty: 90 tablet, Refills: 1    !! feeding supplement, ENSURE ENLIVE, (ENSURE ENLIVE) LIQD Take 237 mLs by mouth 2 (two) times daily between meals. Qty: 237 mL, Refills: 12    HYDROcodone-acetaminophen (HYCET) 7.5-325 mg/15 ml solution Take 10 mLs by mouth every 4 (four) hours as needed for moderate pain. Qty: 473 mL, Refills: 0    insulin NPH Human (HUMULIN N) 100 UNIT/ML injection Inject 300 units into the skin every morning. Qty: 9 vial, Refills: 3    losartan-hydrochlorothiazide (HYZAAR) 100-12.5 MG tablet Take 1 tablet by mouth daily.    !! Nutritional Supplements (FEEDING SUPPLEMENT, VITAL AF 1.2 CAL,) LIQD Place 1,000 mLs into feeding tube continuous. At 70 ml/hr from 7pm -7am Qty: 1000 mL, Refills: 12    venlafaxine XR (EFFEXOR-XR) 150 MG 24 hr capsule Take 150 mg by mouth daily with breakfast.    diazepam (VALIUM) 5 MG tablet Take 1 tablet (5 mg total) by mouth 2 (two) times daily as needed. Qty: 180 tablet, Refills: 1    glucose blood (PRECISION XTRA TEST STRIPS) test strip Check blood sugar three times a day dx 250.01 Qty: 270 each, Refills: 3    Insulin Syringe-Needle U-100 30G X 3/8" 1 ML MISC Use to inject insulin 3 times daily. Qty: 300 each, Refills: 1    sildenafil (REVATIO) 20 MG tablet TAKE 1-5 TABLETS BY MOUTH ONCE DAILY AS NEEDED Qty: 50 tablet, Refills: 1     !! - Potential duplicate medications found. Please discuss with provider.    STOP taking these medications     Rivaroxaban (XARELTO) 15 MG TABS tablet        No Known Allergies Follow-up Information    Call [name], MD.   Specialty:  Cardiothoracic Surgery   Why:  to confirm appointment details   Contact information:   [address] [place] [address] [phone]       Follow up with [name], MD. Schedule an appointment as soon as possible for a visit in 10 days.   Specialty:   Endocrinology   Contact information:   [address] [place] [address] [phone]        The results of significant diagnostics from this hospitalization (including imaging, microbiology, ancillary and laboratory) are listed below for reference.    Significant Diagnostic Studies: Dg Chest 1 View  08/18/2015  CLINICAL DATA:  Gastrointestinal bleeding. EXAM: CHEST 1 VIEW COMPARISON:  August 02, 2015. FINDINGS: The heart size and mediastinal contours are within normal limits. Both lungs are clear. No pneumothorax or pleural effusion is noted. The visualized skeletal structures are unremarkable. IMPRESSION: No acute cardiopulmonary abnormality seen. Electronically Signed   By: Marijo Conception, M.D.   On: 08/18/2015 14:12   Dg Chest 2 View  07/24/2015  CLINICAL DATA:  GE junction carcinoma and [name]'s esophagus. Preop respiratory exam for esophagectomy. EXAM: CHEST  2 VIEW COMPARISON:  12/27/2014 FINDINGS: The heart  size and mediastinal contours are within normal limits. Central peribronchial thickening and pulmonary hyperinflation are unchanged, consistent with COPD. No evidence pulmonary infiltrate or pleural effusion. Mild thoracic spine degenerative changes again noted. IMPRESSION: COPD.  No active disease. Electronically Signed   By: Earle Gell M.D.   On: 07/24/2015 17:14   Dg Abd 1 View  08/18/2015  CLINICAL DATA:  GI bleed. Recent esophagectomy on 07/25/2015 for esophageal cancer. Fatigue and weakness. EXAM: ABDOMEN - 1 VIEW COMPARISON:  CT scan dated 07/20/2015 FINDINGS: There is large bore jejunostomy tube in the mid abdomen. Minimal air in nondistended bowel. No visible free air or free fluid. No acute osseous abnormality. IMPRESSION: No acute abnormalities. Jejunostomy tube in place with the tip in the mid right abdomen. Electronically Signed   By: Lorriane Shire M.D.   On: 08/18/2015 14:14   Ct Abdomen Pelvis W Contrast  08/20/2015  CLINICAL DATA:  Acute onset  of gastroesophageal bleeding, with melena. Assess for source. Initial encounter. EXAM: CT ABDOMEN AND PELVIS WITH CONTRAST TECHNIQUE: Multidetector CT imaging of the abdomen and pelvis was performed using the standard protocol following bolus administration of intravenous contrast. CONTRAST:  18mL OMNIPAQUE IOHEXOL 300 MG/ML  SOLN COMPARISON:  Abdominal radiograph from 08/18/2015, and CT of the abdomen and pelvis from 07/20/2015 FINDINGS: Trace bilateral pleural effusions are seen, with mild associated atelectasis. Relatively diffuse coronary artery calcification is noted. The patient is status post partial gastric resection due to known gastric malignancy. There is somewhat asymmetric irregular wall thickening along the body of the stomach, with an underlying moderate to large hiatal hernia. Though this may simply reflect relative decompression, recurrent or persistent malignancy cannot be excluded. PET/CT would be helpful for further evaluation. Diffuse mildly irregular wall thickening is noted along the visualized portions of the hernia. The liver and spleen are unremarkable in appearance. The gallbladder is within normal limits. The pancreas and adrenal glands are unremarkable. Bilateral renal cysts are seen, measuring up to 3.4 cm in size. Scattered calcifications at the renal hila are thought to be vascular in nature. There is no evidence of hydronephrosis. No renal or ureteral stones are seen. No perinephric stranding is appreciated. Postoperative omental inflammation is noted anteriorly. A J-tube is noted in expected position. The small bowel is largely decompressed. No acute vascular abnormalities are seen. Scattered calcification is noted along the abdominal aorta and its branches. The appendix is normal in caliber, without evidence of appendicitis. The colon is unremarkable in appearance. The bladder is relatively decompressed and contains a small amount of air, with a Foley catheter in place. The  prostate is enlarged, measuring 5.2 cm in transverse dimension. No inguinal lymphadenopathy is seen. No acute osseous abnormalities are identified. IMPRESSION: 1. Status post partial gastric resection due to known gastric malignancy. Somewhat irregular asymmetric wall thickening along the body of the stomach, with an underlying moderate to large hiatal hernia. Though this may simply reflect relative decompression, recurrent or persistent malignancy cannot be excluded. Diffuse mildly irregular wall thickening along the visualized portions of the hernia. PET/CT would be helpful for further evaluation. 2. Trace bilateral pleural effusions, with mild associated atelectasis. 3. Relatively diffuse coronary artery calcification noted. 4. Bilateral renal cysts seen. 5. Scattered calcification along the abdominal aorta and its branches. 6. Postoperative omental inflammation noted anteriorly. 7. Enlarged prostate seen. Electronically Signed   By: Garald Balding M.D.   On: 08/20/2015 02:55   Dg Chest Port 1 View  08/02/2015  CLINICAL DATA:  History of  esophageal carcinoma. Status post esophagectomy and video bronchoscopy 07/29/2015. EXAM: PORTABLE CHEST 1 VIEW COMPARISON:  Single view of the chest 07/31/2015 and 07/30/2015. FINDINGS: The patient's left chest tube has been removed. No pneumothorax is identified. Radiopaque object over the left upper chest which may be a surgical drain is unchanged. There is mild bibasilar atelectasis. Heart size is normal. No pleural effusion is identified. IMPRESSION: Status post left chest tube removal. Negative for pneumothorax. No new abnormality. Electronically Signed   By: Inge Rise M.D.   On: 08/02/2015 07:39   Dg Chest Port 1 View  07/31/2015  CLINICAL DATA:  Right side PICC plmt EXAM: PORTABLE CHEST 1 VIEW COMPARISON:  07/31/2015. FINDINGS: Nasogastric tube has been removed. A right-sided PICC line is difficult to follow centrally but likely terminates at the mid to low  SVC. Left chest tube is unchanged in position. Radiopaque object projecting over the left lung apex may be external to the patient. Normal heart size. No pleural fluid. Suspect a 5% left apical pneumothorax. Persistent bibasilar atelectasis. IMPRESSION: Placement of a right-sided PICC line, with tip at the mid to low SVC. Left chest tube remaining in place with suspicion of a 5% left apical pneumothorax. Electronically Signed   By: Abigail Miyamoto M.D.   On: 07/31/2015 16:29   Dg Chest Port 1 View  07/31/2015  CLINICAL DATA:  Respiratory complication EXAM: PORTABLE CHEST 1 VIEW COMPARISON:  07/30/2015 FINDINGS: Left chest tube and NG tube remain in place, unchanged. No visible pneumothorax. Bibasilar opacities, likely atelectasis. Heart is normal size. IMPRESSION: Increasing bibasilar atelectasis.  No visible pneumothorax. Electronically Signed   By: Rolm Baptise M.D.   On: 07/31/2015 07:23   Dg Chest Port 1 View  07/30/2015  CLINICAL DATA:  Left-sided chest tube.  Esophageal carcinoma EXAM: PORTABLE CHEST 1 VIEW COMPARISON:  July 29, 2015 FINDINGS: Left chest tube position is unchanged. No pneumothorax. Nasogastric tube with tip and side port are below the diaphragm. Central catheter has been removed. There is mild atelectasis in the left base. The lungs elsewhere clear. Heart is borderline enlarged with pulmonary vascular within normal limits. Mild prominence in the superior mediastinum is stable in part may be due to portable cough positioning/technique. IMPRESSION: No change in left chest tube position. No pneumothorax. Mild left base atelectasis. Lungs elsewhere clear. No change in cardiomediastinal silhouette. Right central catheter no longer present. Electronically Signed   By: Lowella Grip III M.D.   On: 07/30/2015 07:28   Dg Chest Port 1 View  07/29/2015  CLINICAL DATA:  Left-sided chest tube EXAM: PORTABLE CHEST 1 VIEW COMPARISON:  07/28/2015 FINDINGS: Surgical drain in the LEFT neck. NG  tube and LEFT central venous line are unchanged. Chest tube at the LEFT lung base. Small LEFT apical pneumothorax with pleural edge 3 mm from chest wall. IMPRESSION: Stable support apparatus. LEFT chest tube in with trace apical pneumothorax. Electronically Signed   By: Suzy Bouchard M.D.   On: 07/29/2015 07:23   Dg Chest Port 1 View  07/28/2015  CLINICAL DATA:  Esophageal cancer EXAM: PORTABLE CHEST 1 VIEW COMPARISON:  Yesterday FINDINGS: Mild cardiomegaly. Left supraclavicular Penrose drain. Stable left chest tube, NG tube, and right jugular venous catheter. Left basilar consolidation is stable. Hazy opacity at the right base increased likely volume loss. No pneumothorax. IMPRESSION: Stable left basilar consolidation. Increased volume loss at the right base. Electronically Signed   By: Marybelle Killings M.D.   On: 07/28/2015 08:01   Dg Chest [facility]  1 View  07/27/2015  CLINICAL DATA:  Status post total esophagectomy EXAM: PORTABLE CHEST 1 VIEW COMPARISON:  Portable chest x-ray of July 26, 2015 FINDINGS: The lungs are adequately inflated. The interstitial markings are slightly less conspicuous overall today. They remain more conspicuous on the left than on the right. Left lower lobe atelectasis and small left pleural effusion are present. The left-sided chest tube is stable with its tip overlying the posterior portion of the ninth rib. Again noted is radiodense tape like structure overlying the left pulmonary apex unchanged from the previous study. The right lung is clear. The heart is normal in size. The pulmonary vascularity is only minimally prominent. The esophagogastric tube tip cannot be clearly discerned due to technical factors. The right internal jugular venous catheter tip is stable. IMPRESSION: 1. The lungs are well-expanded with the exception of small amount of left basilar atelectasis and tiny left pleural effusion. The left-sided chest tube is stable in appearance. 2. The cardiac silhouette is  top-normal in size. The pulmonary vascularity is nearly normal. 3. Positioning of the tip of the esophagogastric tube cannot be assessed on this study. It may lie just above the thoracoabdominal junction. A high abdominal radiograph is recommended. 4. Stable radiodense tape like structure projecting over the left pulmonary apex which is been previously described is presumably external to the patient. Electronically Signed   By: David  Martinique M.D.   On: 07/27/2015 07:47   Dg Chest Port 1 View  07/26/2015  CLINICAL DATA:  Chest tube. EXAM: PORTABLE CHEST 1 VIEW COMPARISON:  07/25/2015. FINDINGS: Interim extubation. Right IJ line, NG tube, left chest tube in stable position. 10 cm density, possibly bandaging is noted over the cervical thoracic junction on the left. Similar findings noted on prior exam. Presumably this is outside the patient. Clinical correlation suggested. Stable cardiomegaly. No pleural effusion or pneumothorax. IMPRESSION: 1. Interim extubation removal of NG tube. Remaining lines and tubes in stable position. Left chest tube unchanged. No pneumothorax. 2. Stable cardiomegaly. 3. 10 cm density, possibly bandaging is noted over the cervical thoracic junction left. Similar findings noted on prior exam. Presumably this is outside the patient. Clinical correlation suggested. Electronically Signed   By: Marcello Moores  Register   On: 07/26/2015 07:18   Dg Chest Port 1 View  07/25/2015  CLINICAL DATA:  Intubation and central line placement EXAM: PORTABLE CHEST 1 VIEW COMPARISON:  07/24/2015 FINDINGS: Right internal jugular central line tip over the superior vena cava. Endotracheal tube has been placed with tip about 5 cm above the carina. There is a left chest tube. An NG tube is identified with tip at least as far as the distal esophagus. The tip cannot be well evaluated due to technical limitations. Mild cardiac enlargement. Vascular congestion with mild interstitial pulmonary edema. Crenulated foreign  nonanatomic material measuring 10 cm projects over the cervical thoracic junction on the left. IMPRESSION: Mild pulmonary edema. Lines and tubes as described above. Cannot verified position of NG tube tip. Crenulated foreign nonanatomic material measuring 10 cm projects over the cervical thoracic junction on the left. Presumably external to patient. Correlate clinically and recommend attention to this on anticipated future images. Electronically Signed   By: Skipper Cliche M.D.   On: 07/25/2015 16:35   Dg Esophagus W/water Sol Cm  08/01/2015  CLINICAL DATA:  70 year old male with history of adenocarcinoma of the distal esophagus and gastroesophageal junction status post transhiatal total esophagectomy, cervical esophagogastrostomy, and pyloromyotomy. EXAM: ESOPHOGRAM/BARIUM SWALLOW TECHNIQUE: Single contrast examination was  performed using 150 mL of Omnipaque 300. FLUOROSCOPY TIME:  If the device does not provide the exposure index: Fluoroscopy Time:  1 minutes and 37 seconds Number of Acquired Images:  25 series with multiple images. COMPARISON:  No priors. FINDINGS: Single contrast esophagram was performed, which demonstrated postoperative changes of esophagectomy and gastric pull-through. No evidence of anastomotic leak identified. No focal abnormalities. Contrast was noted to pass through the pylorus into the proximal duodenum. IMPRESSION: 1. Expected postoperative appearance following esophagectomy and gastric pull-through, without complicating features, as above. Electronically Signed   By: Vinnie Langton M.D.   On: 08/01/2015 13:29    Microbiology: Recent Results (from the past 240 hour(s))  Blood Culture (routine x 2)     Status: None (Preliminary result)   Collection Time: 08/18/15 12:15 PM  Result Value Ref Range Status   Specimen Description BLOOD RIGHT ANTECUBITAL  Final   Special Requests BOTTLES DRAWN AEROBIC AND ANAEROBIC 5CC  Final   Culture NO GROWTH 3 DAYS  Final   Report Status  PENDING  Incomplete  Blood Culture (routine x 2)     Status: None (Preliminary result)   Collection Time: 08/18/15 12:15 PM  Result Value Ref Range Status   Specimen Description BLOOD LEFT ANTECUBITAL  Final   Special Requests BOTTLES DRAWN AEROBIC AND ANAEROBIC 5CC  Final   Culture NO GROWTH 3 DAYS  Final   Report Status PENDING  Incomplete  Urine culture     Status: None   Collection Time: 08/18/15  2:30 PM  Result Value Ref Range Status   Specimen Description URINE, CATHETERIZED  Final   Special Requests NONE  Final   Culture NO GROWTH 1 DAY  Final   Report Status 08/19/2015 FINAL  Final  MRSA PCR Screening     Status: None   Collection Time: 08/18/15  4:55 PM  Result Value Ref Range Status   MRSA by PCR NEGATIVE NEGATIVE Final    Comment:        The GeneXpert MRSA Assay (FDA approved for NASAL specimens only), is one component of a comprehensive MRSA colonization surveillance program. It is not intended to diagnose MRSA infection nor to guide or monitor treatment for MRSA infections.      Labs: Basic Metabolic Panel:  Recent Labs Lab 08/18/15 1215 08/19/15 0508  NA 139 143  K 4.5 3.9  CL 103 111  CO2 26 27  GLUCOSE 237* 70  BUN 42* 29*  CREATININE 1.30* 1.17  CALCIUM 8.7* 8.3*   Liver Function Tests:  Recent Labs Lab 08/18/15 1215  AST 19  ALT 15*  ALKPHOS 81  BILITOT 0.2*  PROT 5.9*  ALBUMIN 2.7*   CBC:  Recent Labs Lab 08/18/15 1215  08/19/15 1954 08/20/15 0046 08/20/15 0845 08/21/15 0531 08/22/15 0310  WBC 8.5  < > 5.9 6.0 5.4 5.3 5.5  NEUTROABS 6.9  --   --   --   --   --   --   HGB 4.4*  < > 7.1* 7.4* 8.6* 8.4* 9.2*  HCT 14.1*  < > 21.7* 22.9* 26.2* 25.9* 27.3*  MCV 99.3  < > 92.7 93.5 91.6 91.5 94.1  PLT 197  < > 126* 141* 128* 134* 132*  < > = values in this interval not displayed. Cardiac Enzymes:  Recent Labs Lab 08/18/15 2359 08/19/15 0508 08/19/15 1100 08/19/15 1827 08/21/15 0805  TROPONINI 0.55* 0.63* 0.69* 0.77*  0.48*    CBG:  Recent Labs Lab 08/21/15 1628 08/21/15  2114 08/21/15 2332 08/22/15 0356 08/22/15 0817  GLUCAP 178* 289* 254* 237* 328*    Signed:  Barton Dubois MD   [facility] 08/22/2015, 8:59 AM

## 2015-08-22 NOTE — Progress Notes (Signed)
Inpatient Diabetes Program Recommendations  AACE/ADA: New Consensus Statement on Inpatient Glycemic Control (2015)  Target Ranges:  Prepandial:   less than 140 mg/dL      Peak postprandial:   less than 180 mg/dL (1-2 hours)      Critically ill patients:  140 - 180 mg/dL   Results for KYZEN, RAINGE (MRN [id number]) as of 08/22/2015 08:41  Ref. Range 08/21/2015 07:57 08/21/2015 11:34 08/21/2015 16:28 08/21/2015 21:14 08/21/2015 23:32 08/22/2015 03:56 08/22/2015 08:17  Glucose-Capillary Latest Ref Range: 65-99 mg/dL 264 (H) 268 (H) 178 (H) 289 (H) 254 (H) 237 (H) 328 (H)   Review of Glycemic Control  Diabetes history: DM 2 Outpatient Diabetes medications: NPH 300 units with breakfast Current orders for Inpatient glycemic control: Lantus 15 units, Novolog Sensitive Q4hrs  Inpatient Diabetes Program Recommendations: Insulin - Basal: Fasting glucose 328 mg/dl this am. Patient takes 300 units of NPH with breakfast at home. Please increase basal insulin to Lantus 25 units Q24hrs.  Thanks,  Tama Headings RN, MSN, [facility] Inpatient Diabetes Coordinator Team Pager [phone] (8a-5p)

## 2015-08-23 DIAGNOSIS — E109 Type 1 diabetes mellitus without complications: Secondary | ICD-10-CM | POA: Diagnosis not present

## 2015-08-23 DIAGNOSIS — J449 Chronic obstructive pulmonary disease, unspecified: Secondary | ICD-10-CM | POA: Diagnosis not present

## 2015-08-23 DIAGNOSIS — Z434 Encounter for attention to other artificial openings of digestive tract: Secondary | ICD-10-CM | POA: Diagnosis not present

## 2015-08-23 DIAGNOSIS — C160 Malignant neoplasm of cardia: Secondary | ICD-10-CM | POA: Diagnosis not present

## 2015-08-23 DIAGNOSIS — K219 Gastro-esophageal reflux disease without esophagitis: Secondary | ICD-10-CM | POA: Diagnosis not present

## 2015-08-23 DIAGNOSIS — C155 Malignant neoplasm of lower third of esophagus: Secondary | ICD-10-CM | POA: Diagnosis not present

## 2015-08-23 LAB — CULTURE, BLOOD (ROUTINE X 2)
Culture: NO GROWTH
Culture: NO GROWTH

## 2015-08-24 ENCOUNTER — Encounter: Payer: Medicare Other | Admitting: Cardiothoracic Surgery

## 2015-08-24 DIAGNOSIS — C155 Malignant neoplasm of lower third of esophagus: Secondary | ICD-10-CM | POA: Diagnosis not present

## 2015-08-24 DIAGNOSIS — K219 Gastro-esophageal reflux disease without esophagitis: Secondary | ICD-10-CM | POA: Diagnosis not present

## 2015-08-24 DIAGNOSIS — Z434 Encounter for attention to other artificial openings of digestive tract: Secondary | ICD-10-CM | POA: Diagnosis not present

## 2015-08-24 DIAGNOSIS — C160 Malignant neoplasm of cardia: Secondary | ICD-10-CM | POA: Diagnosis not present

## 2015-08-25 DIAGNOSIS — C160 Malignant neoplasm of cardia: Secondary | ICD-10-CM | POA: Diagnosis not present

## 2015-08-25 DIAGNOSIS — Z434 Encounter for attention to other artificial openings of digestive tract: Secondary | ICD-10-CM | POA: Diagnosis not present

## 2015-08-25 DIAGNOSIS — C155 Malignant neoplasm of lower third of esophagus: Secondary | ICD-10-CM | POA: Diagnosis not present

## 2015-08-25 DIAGNOSIS — E109 Type 1 diabetes mellitus without complications: Secondary | ICD-10-CM | POA: Diagnosis not present

## 2015-08-25 DIAGNOSIS — K219 Gastro-esophageal reflux disease without esophagitis: Secondary | ICD-10-CM | POA: Diagnosis not present

## 2015-08-25 DIAGNOSIS — J449 Chronic obstructive pulmonary disease, unspecified: Secondary | ICD-10-CM | POA: Diagnosis not present

## 2015-08-28 ENCOUNTER — Other Ambulatory Visit: Payer: Self-pay | Admitting: Cardiothoracic Surgery

## 2015-08-28 DIAGNOSIS — C159 Malignant neoplasm of esophagus, unspecified: Secondary | ICD-10-CM

## 2015-08-29 DIAGNOSIS — C160 Malignant neoplasm of cardia: Secondary | ICD-10-CM | POA: Diagnosis not present

## 2015-08-29 DIAGNOSIS — Z434 Encounter for attention to other artificial openings of digestive tract: Secondary | ICD-10-CM | POA: Diagnosis not present

## 2015-08-29 DIAGNOSIS — E109 Type 1 diabetes mellitus without complications: Secondary | ICD-10-CM | POA: Diagnosis not present

## 2015-08-29 DIAGNOSIS — J449 Chronic obstructive pulmonary disease, unspecified: Secondary | ICD-10-CM | POA: Diagnosis not present

## 2015-08-29 DIAGNOSIS — C155 Malignant neoplasm of lower third of esophagus: Secondary | ICD-10-CM | POA: Diagnosis not present

## 2015-08-29 DIAGNOSIS — K219 Gastro-esophageal reflux disease without esophagitis: Secondary | ICD-10-CM | POA: Diagnosis not present

## 2015-08-31 ENCOUNTER — Encounter: Payer: Self-pay | Admitting: Cardiothoracic Surgery

## 2015-08-31 ENCOUNTER — Ambulatory Visit
Admission: RE | Admit: 2015-08-31 | Discharge: 2015-08-31 | Disposition: A | Discharge status: Home / Self Care | Payer: Medicare Other | Source: Ambulatory Visit | Attending: Cardiothoracic Surgery | Admitting: Cardiothoracic Surgery

## 2015-08-31 ENCOUNTER — Ambulatory Visit (INDEPENDENT_AMBULATORY_CARE_PROVIDER_SITE_OTHER): Payer: Medicare Other | Admitting: Endocrinology

## 2015-08-31 ENCOUNTER — Encounter: Payer: Self-pay | Admitting: Endocrinology

## 2015-08-31 ENCOUNTER — Ambulatory Visit (INDEPENDENT_AMBULATORY_CARE_PROVIDER_SITE_OTHER): Payer: Self-pay | Admitting: Cardiothoracic Surgery

## 2015-08-31 VITALS — BP 98/64 | HR 96 | Resp 20 | Ht 76.0 in | Wt 228.0 lb

## 2015-08-31 VITALS — BP 134/62 | HR 103 | Temp 97.8°F | Ht 76.0 in | Wt 228.0 lb

## 2015-08-31 DIAGNOSIS — D649 Anemia, unspecified: Secondary | ICD-10-CM | POA: Diagnosis not present

## 2015-08-31 DIAGNOSIS — C159 Malignant neoplasm of esophagus, unspecified: Secondary | ICD-10-CM

## 2015-08-31 DIAGNOSIS — E1122 Type 2 diabetes mellitus with diabetic chronic kidney disease: Secondary | ICD-10-CM

## 2015-08-31 DIAGNOSIS — E1129 Type 2 diabetes mellitus with other diabetic kidney complication: Secondary | ICD-10-CM | POA: Diagnosis not present

## 2015-08-31 DIAGNOSIS — Z9889 Other specified postprocedural states: Secondary | ICD-10-CM

## 2015-08-31 DIAGNOSIS — N189 Chronic kidney disease, unspecified: Secondary | ICD-10-CM | POA: Diagnosis not present

## 2015-08-31 DIAGNOSIS — Z9049 Acquired absence of other specified parts of digestive tract: Secondary | ICD-10-CM

## 2015-08-31 LAB — BASIC METABOLIC PANEL
BUN: 28 mg/dL — ABNORMAL HIGH (ref 6–23)
CALCIUM: 9.4 mg/dL (ref 8.4–10.5)
CO2: 32 meq/L (ref 19–32)
Chloride: 97 mEq/L (ref 96–112)
Creatinine, Ser: 1.08 mg/dL (ref 0.40–1.50)
GFR: 72.41 mL/min (ref >60.00)
Glucose, Bld: 165 mg/dL — ABNORMAL HIGH (ref 70–99)
POTASSIUM: 5.3 meq/L — AB (ref 3.5–5.1)
SODIUM: 135 meq/L (ref 135–145)

## 2015-08-31 LAB — CBC WITH DIFFERENTIAL/PLATELET
BASOS ABS: 0 10*3/uL (ref 0.0–0.1)
Basophils Relative: 0.6 % (ref 0.0–3.0)
EOS PCT: 4.3 % (ref 0.0–5.0)
Eosinophils Absolute: 0.3 10*3/uL (ref 0.0–0.7)
HEMATOCRIT: 32.5 % — AB (ref 39.0–52.0)
Hemoglobin: 10.7 g/dL — ABNORMAL LOW (ref 13.0–17.0)
LYMPHS PCT: 15 % (ref 12.0–46.0)
Lymphs Abs: 1 10*3/uL (ref 0.7–4.0)
MCHC: 32.9 g/dL (ref 30.0–36.0)
MCV: 93.5 fl (ref 78.0–100.0)
MONOS PCT: 6.3 % (ref 3.0–12.0)
Monocytes Absolute: 0.4 10*3/uL (ref 0.1–1.0)
NEUTROS ABS: 5 10*3/uL (ref 1.4–7.7)
Neutrophils Relative %: 73.8 % (ref 43.0–77.0)
Platelets: 168 10*3/uL (ref 150.0–400.0)
RBC: 3.47 Mil/uL — AB (ref 4.22–5.81)
RDW: 17.6 % — ABNORMAL HIGH (ref 11.5–15.5)
WBC: 6.7 10*3/uL (ref 4.0–10.5)

## 2015-08-31 LAB — IBC PANEL
Iron: 42 ug/dL (ref 42–165)
SATURATION RATIOS: 13.7 % — AB (ref 20.0–50.0)
Transferrin: 219 mg/dL (ref 212.0–360.0)

## 2015-08-31 LAB — TSH: TSH: 2.73 u[IU]/mL (ref 0.35–4.50)

## 2015-08-31 MED ORDER — INSULIN NPH (HUMAN) (ISOPHANE) 100 UNIT/ML ~~LOC~~ SUSP: 200.0000 [IU] | Freq: Every day | SUBCUTANEOUS | Status: DC

## 2015-08-31 MED ORDER — VENLAFAXINE HCL ER 150 MG PO CP24: 300.0000 mg | ORAL_CAPSULE | Freq: Every day | ORAL | Status: DC

## 2015-08-31 NOTE — Patient Instructions (Addendum)
blood tests are requested for you today.  We'll let you know about the results. Please stay off the hyzaar for now.  We can resume the blood thinner only when and if Dr Servando Snare says it is ok.  i have sent a prescription to your pharmacy, to double the effexor.   Please come back for a follow-up appointment in 1 month.

## 2015-08-31 NOTE — Progress Notes (Signed)
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 SURGICAL PROCEDURE: Bronchoscopy, transhiatal total esophagectomy with cervical esophagogastrostomy, pyloromyotomy, and placement of jejunal feeding tube. SURGEON: Lanelle Bal, MD.  GE junction carcinoma [facility])   Staging form: Stomach, AJCC 7th Edition     Clinical: Stage IIB (T3, N1, M0) - Signed by Ladell Pier, MD on 03/28/2015   Staging form: Esophagus - Adenocarcinoma, AJCC 7th Edition     Pathologic stage from 07/26/2015: Stage IIIB (yT3, N2, cM0, G3 - Poorly differentiated) - Signed by [name], MD on 07/28/2015  History of Present Illness:     Patient returns to the office today after transhiatal esophagectomy cervical esophagogastrostomy pyloromyotomy and placement of  jejunal feeding tube. Because of the question of possible small pulmonary embolus to the left lower lobe on a preop CT scan but without documented proximal vein DVT the patient was started on some role time postoperatively and was discharged home. Unfortunately he developed significant GI bleed on Xarelto was readmitted transfused and Xarelto discontinued it since he's had no further bleeding. No definite source of bleeding was noted his primary complaint was melanotic stools. Currently he is taking a by mouth diet well but has been supplementing it with 3-6 hours of tube feedings a day. He's had no shortness of breath     Past Medical History  Diagnosis Date  . COLONIC POLYPS, ADENOMATOUS 08/01/2008, 2013,  2014  . DIABETES MELLITUS, TYPE I 03/13/2007  . HYPERCHOLESTEROLEMIA 02/03/2008  . Anemia 08/15/2009  . HYPERTENSION 03/13/2007  . PULMONARY NODULE 11/24/2008  . GERD 03/13/2007  . TOBACCO ABUSE 11/24/2008  . EMPHYSEMA 11/08/2008  . Prostatism   . ED (erectile dysfunction)   . [name]'s esophagus 2007  . Hiatal hernia   . Diverticulitis   . Arthritis   . COPD (chronic obstructive pulmonary disease) ([place])   . Anxiety   . Depression   . History of blood transfusion 08/18/2015    due to GIB  . GIB (gastrointestinal bleeding) 08/18/2015  . Esophageal cancer ([place]) "dx'd ~ 01/2015"  . Stomach cancer ([place]) "dx'd ~ 01/2015"     History  Smoking status  . Former Smoker -- 1.00 packs/day for 35 years  . Types: Cigarettes  . Quit date: 10/31/2008  Smokeless tobacco  . Never Used    History  Alcohol Use  . 0.0 oz/week  . 0 Standard drinks or equivalent per week    Comment: 2/month only on vacation     No Known Allergies  Current Outpatient Prescriptions  Medication Sig Dispense Refill  . atorvastatin (LIPITOR) 10 MG tablet Take 1 tablet by mouth  every day (Patient taking differently: Take 1 tablet by mouth  every evening) 90 tablet 1  . diazepam (VALIUM) 5 MG tablet Take 1 tablet (5 mg total) by mouth 2 (two) times daily as needed. (Patient taking differently: Take 5 mg by mouth 2 (two) times daily as needed for anxiety. ) 180 tablet 1  . esomeprazole (NEXIUM) 40 MG capsule Take 1  capsule (40 mg total) by mouth 2 (two) times daily before a meal. 60 capsule 1  . feeding supplement, ENSURE ENLIVE, (ENSURE ENLIVE) LIQD Take 237 mLs by mouth 2 (two) times daily between meals. 237 mL 12  . glucose blood (PRECISION XTRA TEST STRIPS) test strip Check blood sugar three times a day dx 250.01 270 each 3  . HYDROcodone-acetaminophen (HYCET) 7.5-325 mg/15 ml solution Take 10 mLs by mouth every 4 (four) hours as needed for moderate pain. 473 mL 0  . insulin NPH Human (HUMULIN N) 100 UNIT/ML  injection Inject 2 mLs (200 Units total) into the skin daily before breakfast. 9 vial 3  . Insulin Syringe-Needle U-100 30G X 3/8" 1 ML MISC Use to inject insulin 3 times daily. 300 each 1  . Nutritional Supplements (FEEDING SUPPLEMENT, VITAL AF 1.2 CAL,) LIQD Place 1,000 mLs into feeding tube continuous. At 70 ml/hr from 7pm -7am 1000 mL 12  . sildenafil (REVATIO) 20 MG tablet TAKE 1-5 TABLETS BY MOUTH ONCE DAILY AS NEEDED 50 tablet 1  . venlafaxine XR (EFFEXOR-XR) 150 MG 24 hr capsule Take 2 capsules (300 mg total) by mouth daily. 180 capsule 3  . [DISCONTINUED] metoprolol succinate (TOPROL-XL) 25 MG 24 hr tablet Take 25 mg by mouth daily.      No current facility-administered medications for this visit.       Physical Exam: BP 98/64 mmHg  Pulse 96  Resp 20  Ht 6\' 4"  (1.93 m)  Wt 228 lb (103.42 kg)  BMI 27.76 kg/m2  SpO2 91%  General appearance: alert and cooperative Neurologic: intact Heart: regular rate and rhythm, S1, S2 normal, no murmur, click, rub or gallop Lungs: clear to auscultation bilaterally Abdomen: soft, non-tender; bowel sounds normal; no masses,  no organomegaly Extremities: extremities normal, atraumatic, no cyanosis or edema and Homans sign is negative, no sign of DVT Wound: Neck incision is well-healed abdominal incision is well-healed, jejunal tube is in place without evidence of infection   Diagnostic Studies & Laboratory data:     Recent Radiology Findings:   Dg Chest 2 View  08/31/2015  CLINICAL DATA:  Esophageal cancer with esophagectomy 07/25/2015 EXAM: CHEST  2 VIEW COMPARISON:  08/18/2015 FINDINGS: Postop esophagectomy. Soft tissue thickening posterior to the heart compatible with postsurgical change and esophagectomy. Heart size normal. Negative for heart failure. No infiltrate or effusion. IMPRESSION: Soft tissue thickening in the posterior mediastinum compatible with esophagectomy. Negative for infiltrate or effusion.  Lungs are clear. Electronically  Signed   By: Franchot Gallo M.D.   On: 08/31/2015 12:29      Recent Lab Findings: Lab Results  Component Value Date   WBC 5.5 08/22/2015   HGB 9.2* 08/22/2015   HCT 27.3* 08/22/2015   PLT 132* 08/22/2015   GLUCOSE 70 08/19/2015   CHOL 170 12/02/2014   TRIG * 12/02/2014    449.0 Triglyceride is over 400; calculations on Lipids are invalid.   HDL 32.40* 12/02/2014   LDLDIRECT 93.0 12/02/2014   LDLCALC 94 06/17/2014   ALT 15* 08/18/2015   AST 19 08/18/2015   NA 143 08/19/2015   K 3.9 08/19/2015   CL 111 08/19/2015   CREATININE 1.17 08/19/2015   BUN 29* 08/19/2015   CO2 27 08/19/2015   TSH 2.50 12/02/2014   INR 1.06 08/19/2015   HGBA1C 8.0* 08/18/2015      Assessment / Plan:     Status post transhiatal esophagectomy pathologic stage IIIB, question of preoperative pulmonary embolus, now off of anticoagulation  because of GI bleed. Patient is progressing reasonably well since his discharge home taking a by mouth diet. We'll plan to stop tube feedings now and see him back in 2 weeks the. If he continues with adequate by mouth intake and stabilized weight will consider removal of the jejunostomy tube. He had a CBC drawn this morning in the primary care office the results are not available yet. Plan to see the patient back in 2 weeks   [name] MD      [address] [place] ,[place] [address] Office [phone]   Beeper [phone]  08/31/2015 12:49 PM

## 2015-08-31 NOTE — Progress Notes (Signed)
Subjective:    Patient ID: Ronald Johnson, male    DOB: 05-14-1946, 70 y.o.   MRN: [id number]  HPI Pt returns for f/u of diabetes mellitus: DM type: Insulin-requiring type 2.  Dx'ed: 2423 Complications: polyneuropathy, PAD, and nephropathy.  Therapy: insulin since 2004.  DKA: never.  Severe hypoglycemia: never.   Pancreatitis: never.   Other: he changed to qd insulin, after he did not achieve good control on multiple daily injections; he cannot afford insulin analogs.  Interval history: He has lost 26 lbs, due to recent illness.  He takes 100-200 units qam.  no cbg record, but states cbg's vary from 61-300.  It is in general higher as the day goes on.  He finds it difficult to know his dosage, because he appetite is poor and variable from day to day.  However, it is improving in general.  HTN: hyzaar was d/c'ed in the hospital, due to lower BP.  He was recently in the hospital with GI bleed: no further bleeding.  He does not take an fe tab. He has h/o pulm emboli, but coumadin had to be d/c'ed, due to GI bleed: he denies sob.   Past Medical History  Diagnosis Date  . COLONIC POLYPS, ADENOMATOUS 08/01/2008, 2013, 2014  . DIABETES MELLITUS, TYPE I 03/13/2007  . HYPERCHOLESTEROLEMIA 02/03/2008  . Anemia 08/15/2009  . HYPERTENSION 03/13/2007  . PULMONARY NODULE 11/24/2008  . GERD 03/13/2007  . TOBACCO ABUSE 11/24/2008  . EMPHYSEMA 11/08/2008  . Prostatism   . ED (erectile dysfunction)   . [name]'s esophagus 2007  . Hiatal hernia   . Diverticulitis   . Arthritis   . COPD (chronic obstructive pulmonary disease) ([place])   . Anxiety   . Depression   . History of blood transfusion 08/18/2015    due to GIB  . GIB (gastrointestinal bleeding) 08/18/2015  . Esophageal cancer ([place]) "dx'd ~ 01/2015"  . Stomach cancer ([place]) "dx'd ~ 01/2015"    Past Surgical History  Procedure Laterality Date  . Egd  11/19/2005  . Foot fracture surgery Right 1980    right foot w/ pins and screws  . Removal pins  and screws foot  1980    right foot  . Upper gastrointestinal endoscopy    . Circumcision    . Colonoscopy    . Polypectomy    . Vasectomy    . Eus N/A 03/16/2015    Procedure: UPPER ENDOSCOPIC ULTRASOUND (EUS) RADIAL;  Surgeon: Milus Banister, MD;  [facility];  Service: Endoscopy;  Laterality: N/A;  . Complete esophagectomy N/A 07/25/2015    Procedure: TRANSHIATAL TOTAL ESOPHAGECTOMY COMPLETE PYLOROMYOTOMY;  Surgeon: [name], MD;  Location: [facility];  Service: Thoracic;  Laterality: N/A;  . Jejunostomy N/A 07/25/2015    Procedure: FEEDING JEJUNOSTOMY;  Surgeon: [name], MD;  Location: [facility];  Service: Thoracic;  Laterality: N/A;  . Video bronchoscopy N/A 07/25/2015    Procedure: VIDEO BRONCHOSCOPY;  Surgeon: [name], MD;  Location: [facility];  Service: Thoracic;  Laterality: N/A;    Social History   Social History  . Marital Status: Married    Spouse Name: N/A  . Number of Children: 2  . Years of Education: N/A   Occupational History  . INSPECTOR     works Youth worker   Social History Main Topics  . Smoking status: Former Smoker -- 1.00 packs/day for 35 years    Types: Cigarettes    Quit date: 10/31/2008  .  Smokeless tobacco: Never Used  . Alcohol Use: 0.0 oz/week    0 Standard drinks or equivalent per week     Comment: 2/month only on vacation  . Drug Use: No  . Sexual Activity: Not Currently   Other Topics Concern  . 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 atorvastatin (LIPITOR) 10 MG tablet Take 1 tablet by mouth  every day (Patient taking differently: Take 1 tablet by mouth  every evening) 90 tablet 1  . diazepam (VALIUM) 5 MG tablet Take 1 tablet (5 mg total) by mouth 2  (two) times daily as needed. (Patient taking differently: Take 5 mg by mouth 2 (two) times daily as needed for anxiety. ) 180 tablet 1  . esomeprazole (NEXIUM) 40 MG capsule Take 1 capsule (40 mg total) by mouth 2 (two) times daily before a meal. 60 capsule 1  . feeding supplement, ENSURE ENLIVE, (ENSURE ENLIVE) LIQD Take 237 mLs by mouth 2 (two) times daily between meals. 237 mL 12  . glucose blood (PRECISION XTRA TEST STRIPS) test strip Check blood sugar three times a day dx 250.01 270 each 3  . HYDROcodone-acetaminophen (HYCET) 7.5-325 mg/15 ml solution Take 10 mLs by mouth every 4 (four) hours as needed for moderate pain. 473 mL 0  . Insulin Syringe-Needle U-100 30G X 3/8" 1 ML MISC Use to inject insulin 3 times daily. 300 each 1  . Nutritional Supplements (FEEDING SUPPLEMENT, VITAL AF 1.2 CAL,) LIQD Place 1,000 mLs into feeding tube continuous. At 70 ml/hr from 7pm -7am 1000 mL 12  . sildenafil (REVATIO) 20 MG tablet TAKE 1-5 TABLETS BY MOUTH ONCE DAILY AS NEEDED 50 tablet 1  . [DISCONTINUED] metoprolol succinate (TOPROL-XL) 25 MG 24 hr tablet Take 25 mg by mouth daily.      No current facility-administered medications on file prior to visit.    No Known Allergies  Family History  Problem Relation Age of Onset  . Stroke Mother   . Diabetes Mother   . Colon cancer Neg Hx   . Diabetes Maternal Grandmother     mother side of the family aunts, MGF  . Breast cancer Paternal Grandmother     BP 134/62 mmHg  Pulse 103  Temp(Src) 97.8 F (36.6 C) (Oral)  Ht '6\' 4"'$  (1.93 m)  Wt 228 lb (103.42 kg)  BMI 27.76 kg/m2  SpO2 91%   Review of Systems Denies LOC, but depression persists (inadeq relief with effexor).  No SI.    Objective:   Physical Exam VITAL SIGNS:  See vs page GENERAL: no distress LUNGS:  Clear to auscultation HEART:  Regular rate and rhythm without murmurs noted. Normal S1,S2.   Pulses: dorsalis pedis intact bilat.   MSK: no deformity of the feet CV: no leg  edema Skin:  no ulcer on the feet.  normal color and temp on the feet. Neuro: sensation is intact to touch on the feet    Lab Results  Component Value Date   HGBA1C 8.0* 08/18/2015   Lab Results  Component Value Date   WBC 6.7 08/31/2015   HGB 10.7* 08/31/2015   HCT 32.5* 08/31/2015   MCV 93.5 08/31/2015   PLT 168.0 08/31/2015  Assessment & Plan:  GIB: anemia is improved Depression: worse: he declines ref psych DM: insulin requirement is variable, due to recent serious illness.  HTN: still well-controlled off hyzaar, but he will likely need resumption in the near future.  pulm emboli: he needs to stay off coumadin for now.   Patient is advised the following: Patient Instructions  blood tests are requested for you today.  We'll let you know about the results. Please stay off the hyzaar for now.  We can resume the blood thinner only when and if Dr Servando Snare says it is ok.  i have sent a prescription to your pharmacy, to double the effexor.   Please come back for a follow-up appointment in 1 month.    addendum: take fe 1-BID

## 2015-09-01 ENCOUNTER — Ambulatory Visit (INDEPENDENT_AMBULATORY_CARE_PROVIDER_SITE_OTHER)
Admission: RE | Admit: 2015-09-01 | Discharge: 2015-09-01 | Disposition: A | Discharge status: Home / Self Care | Payer: Medicare Other | Source: Ambulatory Visit | Attending: Endocrinology | Admitting: Endocrinology

## 2015-09-01 ENCOUNTER — Ambulatory Visit: Payer: Medicare Other | Admitting: Oncology

## 2015-09-01 ENCOUNTER — Other Ambulatory Visit (INDEPENDENT_AMBULATORY_CARE_PROVIDER_SITE_OTHER): Payer: Medicare Other

## 2015-09-01 ENCOUNTER — Ambulatory Visit (INDEPENDENT_AMBULATORY_CARE_PROVIDER_SITE_OTHER): Payer: Medicare Other | Admitting: Endocrinology

## 2015-09-01 ENCOUNTER — Telehealth: Payer: Self-pay | Admitting: Endocrinology

## 2015-09-01 VITALS — BP 112/60 | HR 122 | Temp 98.4°F | Ht 76.0 in | Wt 226.0 lb

## 2015-09-01 DIAGNOSIS — R509 Fever, unspecified: Secondary | ICD-10-CM

## 2015-09-01 DIAGNOSIS — R05 Cough: Secondary | ICD-10-CM | POA: Diagnosis not present

## 2015-09-01 LAB — URINALYSIS, ROUTINE W REFLEX MICROSCOPIC
Bilirubin Urine: NEGATIVE
Hgb urine dipstick: NEGATIVE
KETONES UR: NEGATIVE
Leukocytes, UA: NEGATIVE
Nitrite: NEGATIVE
PH: 5.5 (ref 5.0–8.0)
RBC / HPF: NONE SEEN (ref 0–?)
SPECIFIC GRAVITY, URINE: 1.02 (ref 1.000–1.030)
Total Protein, Urine: 30 — AB
UROBILINOGEN UA: 0.2 (ref 0.0–1.0)

## 2015-09-01 MED ORDER — VENLAFAXINE HCL ER 75 MG PO CP24: 225.0000 mg | ORAL_CAPSULE | Freq: Every day | ORAL | Status: DC

## 2015-09-01 MED ORDER — CEFUROXIME AXETIL 500 MG PO TABS: 500.0000 mg | ORAL_TABLET | Freq: Two times a day (BID) | ORAL | Status: DC

## 2015-09-01 NOTE — Patient Instructions (Signed)
blood tests, urine tests, and a chest x-ray, are requested for you today.  We'll let you know about the results.  I hope you feel better soon.  If you don't feel better by next week, please call back.  Please go back to the ER if you get worse.

## 2015-09-01 NOTE — Telephone Encounter (Signed)
Pt scheduled for 3:15 today

## 2015-09-01 NOTE — Telephone Encounter (Signed)
Pt has developed a bad cough with a 100.9 fever please advise on the OTC med he can take

## 2015-09-01 NOTE — Telephone Encounter (Signed)
Ov today.

## 2015-09-01 NOTE — Telephone Encounter (Signed)
See note below and please advisee, Thanks!

## 2015-09-01 NOTE — Progress Notes (Signed)
Subjective:    Patient ID: Ronald Johnson, male    DOB: 1945-09-26, 70 y.o.   MRN: [id number]  HPI Pt states 1 day moderate weakness throughout the body, and assoc fever. Past Medical History  Diagnosis Date  . COLONIC POLYPS, ADENOMATOUS 08/01/2008, 2013, 2014  . DIABETES MELLITUS, TYPE I 03/13/2007  . HYPERCHOLESTEROLEMIA 02/03/2008  . Anemia 08/15/2009  . HYPERTENSION 03/13/2007  . PULMONARY NODULE 11/24/2008  . GERD 03/13/2007  . TOBACCO ABUSE 11/24/2008  . EMPHYSEMA 11/08/2008  . Prostatism   . ED (erectile dysfunction)   . [name]'s esophagus 2007  . Hiatal hernia   . Diverticulitis   . Arthritis   . COPD (chronic obstructive pulmonary disease) ([place])   . Anxiety   . Depression   . History of blood transfusion 08/18/2015    due to GIB  . GIB (gastrointestinal bleeding) 08/18/2015  . Esophageal cancer ([place]) "dx'd ~ 01/2015"  . Stomach cancer ([place]) "dx'd ~ 01/2015"    Past Surgical History  Procedure Laterality Date  . Egd  11/19/2005  . Foot fracture surgery Right 1980    right foot w/ pins and screws  . Removal pins and screws foot  1980    right foot  . Upper gastrointestinal endoscopy    . Circumcision    . Colonoscopy    . Polypectomy    . Vasectomy    . Eus N/A 03/16/2015    Procedure: UPPER ENDOSCOPIC ULTRASOUND (EUS) RADIAL;  Surgeon: Milus Banister, MD;  [facility];  Service: Endoscopy;  Laterality: N/A;  . Complete esophagectomy N/A 07/25/2015    Procedure: TRANSHIATAL TOTAL ESOPHAGECTOMY COMPLETE PYLOROMYOTOMY;  Surgeon: [name], MD;  Location: [facility];  Service: Thoracic;  Laterality: N/A;  . Jejunostomy N/A 07/25/2015    Procedure: FEEDING JEJUNOSTOMY;  Surgeon: [name], MD;  Location: [facility];  Service: Thoracic;  Laterality: N/A;  . Video bronchoscopy N/A 07/25/2015    Procedure: VIDEO BRONCHOSCOPY;  Surgeon: [name], MD;  Location: [facility];  Service: Thoracic;  Laterality: N/A;    Social History   Social History  .  Marital Status: Married    Spouse Name: N/A  . Number of Children: 2  . Years of Education: N/A   Occupational History  . INSPECTOR     works Youth worker   Social History Main Topics  . Smoking status: Former Smoker -- 1.00 packs/day for 35 years    Types: Cigarettes    Quit date: 10/31/2008  . Smokeless tobacco: Never Used  . Alcohol Use: 0.0 oz/week    0 Standard drinks or equivalent per week     Comment: 2/month only on vacation  . Drug Use: No  . Sexual Activity: Not Currently   Other Topics Concern  . 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 atorvastatin (LIPITOR) 10 MG tablet Take 1 tablet by mouth  every day (Patient taking differently: Take 1 tablet by mouth  every evening) 90 tablet 1  . diazepam (VALIUM) 5 MG tablet Take 1 tablet (5 mg total) by mouth 2 (two) times daily as needed. (Patient taking differently: Take 5 mg by mouth 2 (two) times daily as needed for  anxiety. ) 180 tablet 1  . esomeprazole (NEXIUM) 40 MG capsule Take 1 capsule (40 mg total) by mouth 2 (two) times daily before a meal. 60 capsule 1  . feeding supplement, ENSURE ENLIVE, (ENSURE ENLIVE) LIQD Take 237 mLs by mouth 2 (two) times daily between meals. 237 mL 12  . glucose blood (PRECISION XTRA TEST STRIPS) test strip Check blood sugar three times a day dx 250.01 270 each 3  . HYDROcodone-acetaminophen (HYCET) 7.5-325 mg/15 ml solution Take 10 mLs by mouth every 4 (four) hours as needed for moderate pain. 473 mL 0  . insulin NPH Human (HUMULIN N) 100 UNIT/ML injection Inject 2 mLs (200 Units total) into the skin daily before breakfast. 9 vial 3  . Insulin Syringe-Needle U-100 30G X 3/8" 1 ML MISC Use to inject insulin 3 times daily. 300 each 1    . Nutritional Supplements (FEEDING SUPPLEMENT, VITAL AF 1.2 CAL,) LIQD Place 1,000 mLs into feeding tube continuous. At 70 ml/hr from 7pm -7am 1000 mL 12  . sildenafil (REVATIO) 20 MG tablet TAKE 1-5 TABLETS BY MOUTH ONCE DAILY AS NEEDED 50 tablet 1  . venlafaxine XR (EFFEXOR-XR) 75 MG 24 hr capsule Take 3 capsules (225 mg total) by mouth daily with breakfast. 90 capsule 11  . [DISCONTINUED] metoprolol succinate (TOPROL-XL) 25 MG 24 hr tablet Take 25 mg by mouth daily.      No current facility-administered medications on file prior to visit.    No Known Allergies  Family History  Problem Relation Age of Onset  . Stroke Mother   . Diabetes Mother   . Colon cancer Neg Hx   . Diabetes Maternal Grandmother     mother side of the family aunts, MGF  . Breast cancer Paternal Grandmother     BP 112/60 mmHg  Pulse 122  Temp(Src) 98.4 F (36.9 C) (Oral)  Ht _0  (1.93 m)  Wt 226 lb (102.513 kg)  BMI 27.52 kg/m2  SpO2 93%  Review of Systems denies skin rash, dysuria, earache, myalgias, abd pain, and sob.  He has a slight prod cough.      Objective:   Physical Exam VITAL SIGNS:  See vs page GENERAL: no distress head: no deformity eyes: no periorbital swelling, no proptosis external nose and ears are normal mouth: no lesion seen Both eac's and tm's are normal LUNGS:  Clear to auscultation ABDOMEN: abdomen is soft, nontender.  no hepatosplenomegaly.  not distended.  no hernia Skin: no rash  CXR: slight infiltrate UA: no UTI    Assessment & Plan:  Pneumonia, new.  Patient is advised the following: Patient Instructions  blood tests, urine tests, and a chest x-ray, are requested for you today.  We'll let you know about the results.  I hope you feel better soon.  If you don't feel better by next week, please call back.  Please go back to the ER if you get worse.   addendum: i have sent a prescription to your pharmacy, for an antibiotic pill.

## 2015-09-04 LAB — FRUCTOSAMINE: Fructosamine: 262 umol/L (ref 190–270)

## 2015-09-05 ENCOUNTER — Other Ambulatory Visit: Payer: Self-pay

## 2015-09-05 MED ORDER — CEFUROXIME AXETIL 500 MG PO TABS: 500.0000 mg | ORAL_TABLET | Freq: Two times a day (BID) | ORAL | Status: DC

## 2015-09-07 LAB — CULTURE, BLOOD (SINGLE): Organism ID, Bacteria: NO GROWTH

## 2015-09-14 ENCOUNTER — Ambulatory Visit (INDEPENDENT_AMBULATORY_CARE_PROVIDER_SITE_OTHER): Payer: Self-pay | Admitting: Cardiothoracic Surgery

## 2015-09-14 ENCOUNTER — Encounter: Payer: Self-pay | Admitting: Cardiothoracic Surgery

## 2015-09-14 ENCOUNTER — Other Ambulatory Visit: Payer: Self-pay | Admitting: *Deleted

## 2015-09-14 VITALS — BP 113/68 | HR 113 | Resp 16 | Ht 76.0 in | Wt 220.5 lb

## 2015-09-14 DIAGNOSIS — Z9889 Other specified postprocedural states: Principal | ICD-10-CM

## 2015-09-14 DIAGNOSIS — Z9049 Acquired absence of other specified parts of digestive tract: Secondary | ICD-10-CM

## 2015-09-14 DIAGNOSIS — C159 Malignant neoplasm of esophagus, unspecified: Secondary | ICD-10-CM

## 2015-09-14 DIAGNOSIS — C155 Malignant neoplasm of lower third of esophagus: Secondary | ICD-10-CM

## 2015-09-14 NOTE — Progress Notes (Signed)
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 SURGICAL PROCEDURE: Bronchoscopy, transhiatal total esophagectomy with cervical esophagogastrostomy, pyloromyotomy, and placement of jejunal feeding tube. SURGEON: Lanelle Bal, MD.  GE junction carcinoma [facility])   Staging form: Stomach, AJCC 7th Edition     Clinical: Stage IIB (T3, N1, M0) - Signed by Ladell Pier, MD on 03/28/2015   Staging form: Esophagus - Adenocarcinoma, AJCC 7th Edition     Pathologic stage from 07/26/2015: Stage IIIB (yT3, N2, cM0, G3 - Poorly differentiated) - Signed by [name], MD on 07/28/2015  History of Present Illness:     Patient returns to the office today after transhiatal esophagectomy cervical esophagogastrostomy pyloromyotomy and placement of  jejunal feeding tube. Because of the question of possible small pulmonary embolus to the left lower lobe on a preop CT scan but without documented proximal vein DVT the patient was started on some role time postoperatively and was discharged home. Unfortunately he developed significant GI bleed on Xarelto was readmitted transfused and Xarelto discontinued it since he's had no further bleeding. No definite source of bleeding was noted his primary complaint was melanotic stools. Currently he is taking a by mouth diet well but has been supplementing, he has not used his feeding tube for the past 2 weeks.  After his last visit he was diagnosed with question of pneumonia and took a 14 day course of antibiotics. He  notes he feels much better now. He's lost 5 and half pounds over the last 2 weeks . He's able to take both liquids and solids but notes a feeling of things sticking in his throat.    Past Medical History  Diagnosis Date  . COLONIC POLYPS, ADENOMATOUS 08/01/2008, 2013, 2014  . DIABETES MELLITUS, TYPE I 03/13/2007  . HYPERCHOLESTEROLEMIA 02/03/2008  . Anemia 08/15/2009  . HYPERTENSION 03/13/2007  . PULMONARY NODULE 11/24/2008  . GERD 03/13/2007  . TOBACCO ABUSE 11/24/2008  . EMPHYSEMA 11/08/2008  . Prostatism   . ED (erectile dysfunction)   . [name]'s esophagus 2007  . Hiatal hernia   . Diverticulitis   . Arthritis   . COPD (chronic obstructive pulmonary disease) ([place])   . Anxiety   . Depression   . History of blood transfusion 08/18/2015    due to GIB  . GIB (gastrointestinal bleeding) 08/18/2015  . Esophageal cancer ([place]) "dx'd ~ 01/2015"  . Stomach cancer ([place]) "dx'd ~ 01/2015"     History  Smoking status  . Former Smoker -- 1.00 packs/day for 35 years  . Types: Cigarettes  . Quit date: 10/31/2008  Smokeless tobacco  . Never Used    History  Alcohol Use  . 0.0 oz/week  . 0 Standard drinks or equivalent per week    Comment: 2/month only on vacation     No Known Allergies  Current Outpatient Prescriptions  Medication Sig Dispense Refill  . atorvastatin (LIPITOR) 10 MG tablet Take 1 tablet by mouth  every day (Patient taking differently: Take 1 tablet by mouth  every evening)  90 tablet 1  . diazepam (VALIUM) 5 MG tablet Take 1 tablet (5 mg total) by mouth 2 (two) times daily as needed. (Patient taking differently: Take 5 mg by mouth 2 (two) times daily as needed for anxiety. ) 180 tablet 1  . esomeprazole (NEXIUM) 40 MG capsule Take 1 capsule (40 mg total) by mouth 2 (two) times daily before a meal. 60 capsule 1  . feeding supplement, ENSURE ENLIVE, (ENSURE ENLIVE) LIQD Take 237 mLs by mouth 2 (two) times daily between meals. 237 mL 12  . glucose blood (PRECISION XTRA TEST  STRIPS) test strip Check blood sugar three times a day dx 250.01 270 each 3  . HYDROcodone-acetaminophen (HYCET) 7.5-325 mg/15 ml solution Take 10 mLs by mouth every 4 (four) hours as needed for moderate pain. 473 mL 0  . insulin NPH Human (HUMULIN N) 100 UNIT/ML injection Inject 2 mLs (200 Units total) into the skin daily before breakfast. 9 vial 3  . Insulin Syringe-Needle U-100 30G X 3/8" 1 ML MISC Use to inject insulin 3 times daily. 300 each 1  . sildenafil (REVATIO) 20 MG tablet TAKE 1-5 TABLETS BY MOUTH ONCE DAILY AS NEEDED 50 tablet 1  . venlafaxine XR (EFFEXOR-XR) 75 MG 24 hr capsule Take 3 capsules (225 mg total) by mouth daily with breakfast. 90 capsule 11  . Nutritional Supplements (FEEDING SUPPLEMENT, VITAL AF 1.2 CAL,) LIQD Place 1,000 mLs into feeding tube continuous. At 70 ml/hr from 7pm -7am (Patient not taking: Reported on 09/14/2015) 1000 mL 12  . [DISCONTINUED] metoprolol succinate (TOPROL-XL) 25 MG 24 hr tablet Take 25 mg by mouth daily.      No current facility-administered medications for this visit.    Wt Readings from Last 3 Encounters:  09/14/15 220 lb 8 oz (100.018 kg)  09/01/15 226 lb (102.513 kg)  08/31/15 228 lb (103.42 kg)     Physical Exam: BP 113/68 mmHg  Pulse 113  Resp 16  Ht 6\' 4"  (1.93 m)  Wt 220 lb 8 oz (100.018 kg)  BMI 26.85 kg/m2  SpO2 94%  General appearance: alert and cooperative Neurologic: intact Heart: regular rate and rhythm, S1, S2 normal, no murmur, click, rub or gallop Lungs: clear to auscultation bilaterally Abdomen: soft, non-tender; bowel sounds normal; no masses,  no organomegaly Extremities: extremities normal, atraumatic, no cyanosis or edema and Homans sign is negative, no sign of DVT Wound: Neck incision is well-healed abdominal incision is well-healed, jejunal tube is in place without evidence of infection   Diagnostic Studies & Laboratory data:     Recent Radiology Findings:   No results found.    Recent Lab  Findings: Lab Results  Component Value Date   WBC 6.7 08/31/2015   HGB 10.7* 08/31/2015   HCT 32.5* 08/31/2015   PLT 168.0 08/31/2015   GLUCOSE 165* 08/31/2015   CHOL 170 12/02/2014   TRIG * 12/02/2014    449.0 Triglyceride is over 400; calculations on Lipids are invalid.   HDL 32.40* 12/02/2014   LDLDIRECT 93.0 12/02/2014   LDLCALC 94 06/17/2014   ALT 15* 08/18/2015   AST 19 08/18/2015   NA 135 08/31/2015   K 5.3* 08/31/2015   CL 97 08/31/2015   CREATININE 1.08 08/31/2015   BUN 28* 08/31/2015   CO2 32 08/31/2015   TSH 2.73 08/31/2015   INR 1.06 08/19/2015   HGBA1C 8.0* 08/18/2015      Assessment / Plan:     Status post transhiatal esophagectomy pathologic stage IIIB, question of  preoperative pulmonary embolus, now off of anticoagulation because of GI bleed. Patient is progressing reasonably well since his discharge home taking a by mouth diet.  His weight has dropped 6 pounds over the past 2 weeks  we will leave his feeding tube in place and obtain a Gastrografin swallow to rule out the possibility of early anastomotic stricture  If there is evidence of stricture he may require esophageal dilatation Plan to see him back in 2 weeks or sooner as needed  [name] MD      [address] [place] [address] Office [phone]   Beeper [phone]  09/14/2015 9:27 AM

## 2015-09-18 ENCOUNTER — Ambulatory Visit
Admission: RE | Admit: 2015-09-18 | Discharge: 2015-09-18 | Disposition: A | Discharge status: Home / Self Care | Payer: Medicare Other | Source: Ambulatory Visit | Attending: Cardiothoracic Surgery | Admitting: Cardiothoracic Surgery

## 2015-09-18 DIAGNOSIS — C155 Malignant neoplasm of lower third of esophagus: Secondary | ICD-10-CM | POA: Diagnosis not present

## 2015-09-21 ENCOUNTER — Telehealth: Payer: Self-pay | Admitting: Oncology

## 2015-09-21 ENCOUNTER — Ambulatory Visit (HOSPITAL_BASED_OUTPATIENT_CLINIC_OR_DEPARTMENT_OTHER): Payer: Medicare Other | Admitting: Oncology

## 2015-09-21 ENCOUNTER — Encounter: Payer: Self-pay | Admitting: *Deleted

## 2015-09-21 VITALS — BP 123/71 | HR 104 | Temp 97.7°F | Resp 118 | Wt 221.8 lb

## 2015-09-21 DIAGNOSIS — C159 Malignant neoplasm of esophagus, unspecified: Secondary | ICD-10-CM

## 2015-09-21 DIAGNOSIS — C160 Malignant neoplasm of cardia: Secondary | ICD-10-CM

## 2015-09-21 NOTE — Telephone Encounter (Signed)
Pt confirmed office visit per 02/02 POF, gave pt AVS and Calendar.... KJ, also sent urgent msg for referral to [facility] to see Dr. Karmen Stabs

## 2015-09-21 NOTE — Progress Notes (Signed)
Oncology Nurse Navigator Documentation  Oncology Nurse Navigator Flowsheets 09/21/2015  Navigator Location [facility]Med Onc  Navigator Encounter Type Follow-up Appt  Patient Visit Type MedOnc  Treatment Phase Post-Tx Follow-up  Barriers/Navigation Needs Coordination of Care-will follow up on referral to Dr. Karmen Stabs for possible immunotherapy  Education -  Interventions -  Referrals -  Education Method -  Support Groups/Services GI Support Group  Acuity Level 1  Time Spent with Patient 30  Met with patient and wife during MD visit. Chemo and surgery completed. Hoping to have Jtube removed next week. Eating and weight is starting to increase now. Still some trouble with meats. Reports increase in depression: poor motivation; wants to sleep all the time;denies being suicidal. Talking w/PCP soon about his antidepressant med and seeing counselor.

## 2015-09-21 NOTE — Progress Notes (Signed)
[facility] OFFICE PROGRESS NOTE   Diagnosis: Esophagus cancer  INTERVAL HISTORY:   Ronald Johnson underwent a transhiatal total esophagectomy by Dr. Servando Snare on 07/25/2015. He was discharged 08/06/2015 and was continued on Xarelto anticoagulation.  He was readmitted 08/18/2015 with GI bleeding. He was transfused with packed red blood cells and taken off of anticoagulation. Bilateral leg Dopplers were negative.  Mr. Koeppe reports feeling well. He is tolerating a Soft diet. He is not using the jejunostomy feeding tube. He denies pain. The pathology from the esophagectomy [id number]) confirmed residual invasive adenocarcinoma, poorly differentiated. A treatment effect was present. The resection margins were negative. Perineural and lymphovascular invasion were present. 4 of 5 lymph nodes contained metastatic carcinoma. Tumor invaded through the muscularis propria.  Objective:  Vital signs in last 24 hours:  Blood pressure 123/71, pulse 104, temperature 97.7 F (36.5 C), temperature source Oral, resp. rate 118, weight 221 lb 12.8 oz (100.608 kg), SpO2 97 %.    HEENT: Neck without mass Lymphatics: No cervical, supra-clavicular, or axillary nodes Resp: Distant breath sounds, no respiratory distress Cardio: Regular rate and rhythm GI: No hepatosplenomegaly, left abdomen feeding tube site without evidence of infection, healed surgical incision Vascular: No leg edema    Portacath/PICC-without erythema  Lab Results:  Lab Results  Component Value Date   WBC 6.7 08/31/2015   HGB 10.7* 08/31/2015   HCT 32.5* 08/31/2015   MCV 93.5 08/31/2015   PLT 168.0 08/31/2015   NEUTROABS 5.0 08/31/2015    Lab Results  Component Value Date   NA 135 08/31/2015    No results found for: CEA  Imaging:  Dg Esophagus  09/18/2015  CLINICAL DATA:  Malignant neoplasm lower third of esophagus. Esophagectomy with gastric pull-through. Dysphasia. EXAM: ESOPHOGRAM/BARIUM SWALLOW TECHNIQUE:  Single contrast examination was performed using  thin barium. FLUOROSCOPY TIME:  Radiation Exposure Index (as provided by the fluoroscopic device): If the device does not provide the exposure index: Fluoroscopy Time:  3 Min and 0 seconds Number of Acquired Images: COMPARISON:  Esophagram 08/01/2015 FINDINGS: Postsurgical findings of esophagectomy and gastric pull-through. The anastomosis is widely patent without stricture or mass. No aspiration identified. The thoracic stomach is patent without obstruction. No mucosal edema or ulceration identified. Barium tablet passed readily into the stomach without delay at the anastomosis. IMPRESSION: Satisfactory postoperative appearance of esophagectomy and gastric pull-through. No stricture or mass identified. Electronically Signed   By: Franchot Gallo M.D.   On: 09/18/2015 13:24    Medications: I have reviewed the patient's current medications.  Assessment/Plan: 1. GE junction carcinoma-adenocarcinoma, status post an endoscopic biopsy 03/08/2015  EUS 03/16/2015 confirmed a clinical stage IIb (uT3,uN1) tumor  Staging CTs of the chest, abdomen, and pelvis with no evidence of metastatic disease  Initiation of radiation 04/19/2015, cycle 1 Taxol/carboplatin 04/20/2015: Radiation completed 05/29/2015; the fifth and final week of Taxol/carboplatin 05/18/2015  Restaging CTs 07/20/2015 revealed no evidence of metastatic disease, incidental left lower lobe pulmonary embolism  Transhiatal total esophagectomy 07/25/2015 confirmed a ypT3,ypN2 with 4/5 positive lymph nodes, lymphovascular invasion, perineural invasion, negative resection margins  2. Postprandial subxiphoid pain secondary to #1  3. Diabetes-elevated blood sugar readings at the [facility] and at home  4. COPD  5. Depression  6. Hypertension  7. History of adenomatous colon polyps  8. History of diabetic related neuropathy, peripheral arterial disease, and nephropathy  9.  Nausea-likely related to radiation esophagitis/gastritis  10. Incidental pulmonary embolism noted on the CT 07/20/2015, treated with Xarelto  11.  Admission 08/18/2015 with GI bleeding while on Xarelto, anticoagulation therapy discontinued   Disposition:  Ronald Johnson appears well. He has been diagnosed with stage III esophagus cancer. I reviewed the pathology report, prognosis, and adjuvant treatment options with Mr. Shearer and his wife. There is no "standard "role for adjuvant chemotherapy in this setting. He is at high risk of developing recurrent esophagus cancer over the next few years.  I will refer Mr. Six to Dr. Fanny Skates to consider an adjuvant immunotherapy trial.  He is no longer maintained on anticoagulation after developing a GI bleed while on Xarelto. The pulmonary embolism was an incidental finding in early December and repeat lower extremity Dopplers are negative.  He will return for an office visit in 4 months. We are available to see him sooner as needed.  Betsy Coder, MD  09/21/2015  11:18 AM

## 2015-09-25 ENCOUNTER — Telehealth: Payer: Self-pay | Admitting: Oncology

## 2015-09-25 NOTE — Telephone Encounter (Signed)
Faxed demo, note, path for new pt appt. To Dr. Fanny Skates ([place]) [id number] release id

## 2015-09-27 ENCOUNTER — Other Ambulatory Visit: Payer: Self-pay | Admitting: Cardiothoracic Surgery

## 2015-09-27 DIAGNOSIS — C159 Malignant neoplasm of esophagus, unspecified: Secondary | ICD-10-CM

## 2015-09-28 ENCOUNTER — Encounter: Payer: Self-pay | Admitting: Cardiothoracic Surgery

## 2015-09-28 ENCOUNTER — Ambulatory Visit
Admission: RE | Admit: 2015-09-28 | Discharge: 2015-09-28 | Disposition: A | Discharge status: Home / Self Care | Payer: Medicare Other | Source: Ambulatory Visit | Attending: Cardiothoracic Surgery | Admitting: Cardiothoracic Surgery

## 2015-09-28 ENCOUNTER — Ambulatory Visit (INDEPENDENT_AMBULATORY_CARE_PROVIDER_SITE_OTHER): Payer: Self-pay | Admitting: Cardiothoracic Surgery

## 2015-09-28 VITALS — BP 116/76 | HR 101 | Resp 16 | Ht 76.0 in | Wt 221.0 lb

## 2015-09-28 DIAGNOSIS — Z9889 Other specified postprocedural states: Secondary | ICD-10-CM

## 2015-09-28 DIAGNOSIS — C159 Malignant neoplasm of esophagus, unspecified: Secondary | ICD-10-CM

## 2015-09-28 DIAGNOSIS — Z9049 Acquired absence of other specified parts of digestive tract: Secondary | ICD-10-CM

## 2015-09-28 NOTE — Patient Instructions (Signed)
Can use Simethicone      Gastroesophageal Reflux Disease, Adult Normally, food travels down the esophagus and stays in the stomach to be digested. However, when a person has gastroesophageal reflux disease (GERD), food and stomach acid move back up into the esophagus. When this happens, the esophagus becomes sore and inflamed. Over time, GERD can create small holes (ulcers) in the lining of the esophagus.  CAUSES This condition is caused by a problem with the muscle between the esophagus and the stomach (lower esophageal sphincter, or LES). Normally, the LES muscle closes after food passes through the esophagus to the stomach. When the LES is weakened or abnormal, it does not close properly, and that allows food and stomach acid to go back up into the esophagus. The LES can be weakened by certain dietary substances, medicines, and medical conditions, including:  Tobacco use.  Pregnancy.  Having a hiatal hernia.  Heavy alcohol use.  Certain foods and beverages, such as coffee, chocolate, onions, and peppermint. RISK FACTORS This condition is more likely to develop in:  People who have an increased body weight.  People who have connective tissue disorders.  People who use NSAID medicines. SYMPTOMS Symptoms of this condition include:  Heartburn.  Difficult or painful swallowing.  The feeling of having a lump in the throat.  Abitter taste in the mouth.  Bad breath.  Having a large amount of saliva.  Having an upset or bloated stomach.  Belching.  Chest pain.  Shortness of breath or wheezing.  Ongoing (chronic) cough or a night-time cough.  Wearing away of tooth enamel.  Weight loss. Different conditions can cause chest pain. Make sure to see your health care provider if you experience chest pain. DIAGNOSIS Your health care provider will take a medical history and perform a physical exam. To determine if you have mild or severe GERD, your health care provider may  also monitor how you respond to treatment. You may also have other tests, including:  An endoscopy toexamine your stomach and esophagus with a small camera.  A test thatmeasures the acidity level in your esophagus.  A test thatmeasures how much pressure is on your esophagus.  A barium swallow or modified barium swallow to show the shape, size, and functioning of your esophagus. TREATMENT The goal of treatment is to help relieve your symptoms and to prevent complications. Treatment for this condition may vary depending on how severe your symptoms are. Your health care provider may recommend:  Changes to your diet.  Medicine.  Surgery. HOME CARE INSTRUCTIONS Diet  Follow a diet as recommended by your health care provider. This may involve avoiding foods and drinks such as:  Coffee and tea (with or without caffeine).  Drinks that containalcohol.  Energy drinks and sports drinks.  Carbonated drinks or sodas.  Chocolate and cocoa.  Peppermint and mint flavorings.  Garlic and onions.  Horseradish.  Spicy and acidic foods, including peppers, chili powder, curry powder, vinegar, hot sauces, and barbecue sauce.  Citrus fruit juices and citrus fruits, such as oranges, lemons, and limes.  Tomato-based foods, such as red sauce, chili, salsa, and pizza with red sauce.  Fried and fatty foods, such as donuts, french fries, potato chips, and high-fat dressings.  High-fat meats, such as hot dogs and fatty cuts of red and white meats, such as rib eye steak, sausage, ham, and bacon.  High-fat dairy items, such as whole milk, butter, and cream cheese.  Eat small, frequent meals instead of large meals.  Avoid drinking large amounts of liquid with your meals.  Avoid eating meals during the 2-3 hours before bedtime.  Avoid lying down right after you eat.  Do not exercise right after you eat. General Instructions  Pay attention to any changes in your symptoms.  Take  over-the-counter and prescription medicines only as told by your health care provider. Do not take aspirin, ibuprofen, or other NSAIDs unless your health care provider told you to do so.  Do not use any tobacco products, including cigarettes, chewing tobacco, and e-cigarettes. If you need help quitting, ask your health care provider.  Wear loose-fitting clothing. Do not wear anything tight around your waist that causes pressure on your abdomen.  Raise (elevate) the head of your bed 6 inches (15cm).  Try to reduce your stress, such as with yoga or meditation. If you need help reducing stress, ask your health care provider.  If you are overweight, reduce your weight to an amount that is healthy for you. Ask your health care provider for guidance about a safe weight loss goal.  Keep all follow-up visits as told by your health care provider. This is important. SEEK MEDICAL CARE IF:  You have new symptoms.  You have unexplained weight loss.  You have difficulty swallowing, or it hurts to swallow.  You have wheezing or a persistent cough.  Your symptoms do not improve with treatment.  You have a hoarse voice. SEEK IMMEDIATE MEDICAL CARE IF:  You have pain in your arms, neck, jaw, teeth, or back.  You feel sweaty, dizzy, or light-headed.  You have chest pain or shortness of breath.  You vomit and your vomit looks like blood or coffee grounds.  You faint.  Your stool is bloody or black.  You cannot swallow, drink, or eat.   This information is not intended to replace advice given to you by your health care provider. Make sure you discuss any questions you have with your health care provider.   Document Released: 05/15/2005 Document Revised: 04/26/2015 Document Reviewed: 11/30/2014 Elsevier Interactive Patient Education 2016 [place] for Gastroesophageal Reflux Disease, Adult When you have gastroesophageal reflux disease (GERD), the foods you eat and your  eating habits are very important. Choosing the right foods can help ease the discomfort of GERD. WHAT GENERAL GUIDELINES DO I NEED TO FOLLOW?  Choose fruits, vegetables, whole grains, low-fat dairy products, and low-fat meat, fish, and poultry.  Limit fats such as oils, salad dressings, butter, nuts, and avocado.  Keep a food diary to identify foods that cause symptoms.  Avoid foods that cause reflux. These may be different for different people.  Eat frequent small meals instead of three large meals each day.  Eat your meals slowly, in a relaxed setting.  Limit fried foods.  Cook foods using methods other than frying.  Avoid drinking alcohol.  Avoid drinking large amounts of liquids with your meals.  Avoid bending over or lying down until 2-3 hours after eating. WHAT FOODS ARE NOT RECOMMENDED? The following are some foods and drinks that may worsen your symptoms: Vegetables Tomatoes. Tomato juice. Tomato and spaghetti sauce. Chili peppers. Onion and garlic. Horseradish. Fruits Oranges, grapefruit, and lemon (fruit and juice). Meats High-fat meats, fish, and poultry. This includes hot dogs, ribs, ham, sausage, salami, and bacon. Dairy Whole milk and chocolate milk. Sour cream. Cream. Butter. Ice cream. Cream cheese.  Beverages Coffee and tea, with or without caffeine. Carbonated beverages or energy drinks. Condiments Hot sauce. Barbecue sauce.  Sweets/Desserts Chocolate and cocoa. Donuts. Peppermint and spearmint. Fats and Oils High-fat foods, including [place] fries and potato chips. Other Vinegar. Strong spices, such as black pepper, white pepper, red pepper, cayenne, curry powder, cloves, ginger, and chili powder. The items listed above may not be a complete list of foods and beverages to avoid. Contact your dietitian for more information.   This information is not intended to replace advice given to you by your health care provider. Make sure you discuss any questions  you have with your health care provider.   Document Released: 08/05/2005 Document Revised: 08/26/2014 Document Reviewed: 06/09/2013 Elsevier Interactive Patient Education [employer].

## 2015-09-28 NOTE — Progress Notes (Signed)
[address]       [place] [address]             [phone]      Durell C Linney Waverly Medical Record [id number] Date of Birth: Oct 27, 1945  Referring: Ladell Pier, MD Primary Care: [name], MD  Chief Complaint:   POST OP FOLLOW UP 07/25/2015  OPERATIVE REPORT PREOPERATIVE DIAGNOSIS: Adenocarcinoma of the distal esophagus and gastroesophageal junction. 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 SURGICAL PROCEDURE: Bronchoscopy, transhiatal total esophagectomy with cervical esophagogastrostomy, pyloromyotomy, and placement of jejunal feeding tube. SURGEON: Lanelle Bal, MD.  GE junction carcinoma [facility])   Staging form: Stomach, AJCC 7th Edition     Clinical: Stage IIB (T3, N1, M0) - Signed by Ladell Pier, MD on 03/28/2015   Staging form: Esophagus - Adenocarcinoma, AJCC 7th Edition     Pathologic stage from 07/26/2015: Stage IIIB (yT3, N2, cM0, G3 - Poorly differentiated) - Signed by [name], MD on 07/28/2015  History of Present Illness:     Patient returns to the office today after transhiatal esophagectomy cervical esophagogastrostomy pyloromyotomy and placement of  jejunal feeding tube. Because of the question of possible small pulmonary embolus to the left lower lobe on a preop CT scan but without documented proximal vein DVT the patient was started on some role time postoperatively and was discharged home. Unfortunately he developed significant GI bleed on Xarelto was readmitted transfused and Xarelto discontinued it since he's had no further bleeding. No definite source of bleeding was noted his primary complaint was melanotic stools. Anticoagulation was discontinued   Currently he is taking a by mouth diet well , he has not used his feeding tube for the past 4 weeks. , His weight is stabilized at approximately 221 pounds. Last week a follow-up Gastrografin swallow was  obtained that showed no as evidence of anastomotic stricture.  The patient does complain of symptoms of bile reflux special he at night, with bitter taste in his mouth.   Past Medical History  Diagnosis Date  . COLONIC POLYPS, ADENOMATOUS 08/01/2008, 2013, 2014  . DIABETES MELLITUS, TYPE I 03/13/2007  . HYPERCHOLESTEROLEMIA 02/03/2008  . Anemia 08/15/2009  . HYPERTENSION 03/13/2007  . PULMONARY NODULE 11/24/2008  . GERD 03/13/2007  . TOBACCO ABUSE 11/24/2008  . EMPHYSEMA 11/08/2008  . Prostatism   . ED (erectile dysfunction)   . [name]'s esophagus 2007  . Hiatal hernia   . Diverticulitis   . Arthritis   . COPD (chronic obstructive pulmonary disease) ([place])   . Anxiety   . Depression   . History of blood transfusion 08/18/2015    due to GIB  . GIB (gastrointestinal bleeding) 08/18/2015  . Esophageal cancer ([place]) "dx'd ~ 01/2015"  . Stomach cancer ([place]) "dx'd ~ 01/2015"     History  Smoking status  . Former Smoker -- 1.00 packs/day for 35 years  . Types: Cigarettes  . Quit date: 10/31/2008  Smokeless tobacco  . Never Used    History  Alcohol Use  . 0.0 oz/week  . 0 Standard drinks or equivalent per week    Comment: 2/month only on vacation     No Known Allergies  Current Outpatient Prescriptions  Medication Sig Dispense Refill  . atorvastatin (LIPITOR) 10 MG tablet Take 1 tablet by mouth  every day (Patient taking differently: Take 1 tablet by mouth  every evening) 90 tablet 1  . diazepam (VALIUM) 5 MG tablet Take  1 tablet (5 mg total) by mouth 2 (two) times daily as needed. (Patient taking differently: Take 5 mg by mouth 2 (two) times daily as needed for anxiety. ) 180 tablet 1  . esomeprazole (NEXIUM) 40 MG capsule Take 1 capsule (40 mg total) by mouth 2 (two) times daily before a meal. 60 capsule 1  . feeding supplement, ENSURE ENLIVE, (ENSURE ENLIVE) LIQD Take 237 mLs by mouth 2 (two) times daily between meals. 237 mL 12  . glucose blood (PRECISION XTRA TEST STRIPS)  test strip Check blood sugar three times a day dx 250.01 270 each 3  . insulin NPH Human (HUMULIN N) 100 UNIT/ML injection Inject 2 mLs (200 Units total) into the skin daily before breakfast. 9 vial 3  . Insulin Syringe-Needle U-100 30G X 3/8" 1 ML MISC Use to inject insulin 3 times daily. 300 each 1  . sildenafil (REVATIO) 20 MG tablet TAKE 1-5 TABLETS BY MOUTH ONCE DAILY AS NEEDED 50 tablet 1  . venlafaxine XR (EFFEXOR-XR) 75 MG 24 hr capsule Take 3 capsules (225 mg total) by mouth daily with breakfast. 90 capsule 11  . Nutritional Supplements (FEEDING SUPPLEMENT, VITAL AF 1.2 CAL,) LIQD Place 1,000 mLs into feeding tube continuous. At 70 ml/hr from 7pm -7am (Patient not taking: Reported on 09/14/2015) 1000 mL 12  . [DISCONTINUED] metoprolol succinate (TOPROL-XL) 25 MG 24 hr tablet Take 25 mg by mouth daily.      No current facility-administered medications for this visit.    Wt Readings from Last 3 Encounters:  09/28/15 221 lb (100.245 kg)  09/21/15 221 lb 12.8 oz (100.608 kg)  09/14/15 220 lb 8 oz (100.018 kg)     Physical Exam: BP 116/76 mmHg  Pulse 101  Resp 16  Ht 6\' 4"  (1.93 m)  Wt 221 lb (100.245 kg)  BMI 26.91 kg/m2  SpO2 96%  General appearance: alert and cooperative Neurologic: intact Heart: regular rate and rhythm, S1, S2 normal, no murmur, click, rub or gallop Lungs: clear to auscultation bilaterally Abdomen: soft, non-tender; bowel sounds normal; no masses,  no organomegaly Extremities: extremities normal, atraumatic, no cyanosis or edema and Homans sign is negative, no sign of DVT Wound: Neck incision is well-healed abdominal incision is well-healed,  Patient's feeding tube was removed without difficulty in the office  Diagnostic Studies & Laboratory data:     Recent Radiology Findings:  Dg Esophagus  09/18/2015  CLINICAL DATA:  Malignant neoplasm lower third of esophagus. Esophagectomy with gastric pull-through. Dysphasia. EXAM: ESOPHOGRAM/BARIUM SWALLOW  TECHNIQUE: Single contrast examination was performed using  thin barium. FLUOROSCOPY TIME:  Radiation Exposure Index (as provided by the fluoroscopic device): If the device does not provide the exposure index: Fluoroscopy Time:  3 Min and 0 seconds Number of Acquired Images: COMPARISON:  Esophagram 08/01/2015 FINDINGS: Postsurgical findings of esophagectomy and gastric pull-through. The anastomosis is widely patent without stricture or mass. No aspiration identified. The thoracic stomach is patent without obstruction. No mucosal edema or ulceration identified. Barium tablet passed readily into the stomach without delay at the anastomosis. IMPRESSION: Satisfactory postoperative appearance of esophagectomy and gastric pull-through. No stricture or mass identified. Electronically Signed   By: Franchot Gallo M.D.   On: 09/18/2015 13:24    Dg Chest 2 View  09/28/2015  CLINICAL DATA:  Esophageal cancer.  Esophagectomy. EXAM: CHEST  2 VIEW COMPARISON:  09/01/2015. FINDINGS: Mediastinum and hilar structures are normal. Mild left lower lobe infiltrate consistent pneumonia. No pleural effusion or pneumothorax. Heart size normal. Degenerative  changes thoracic spine drainage catheter noted over the anterior upper abdomen IMPRESSION: Mild left lower lobe infiltrate consistent pneumonia. Electronically Signed   By: Marcello Moores  Register   On: 09/28/2015 11:10    I have independently reviewed the above radiology studies  and reviewed the findings with the patient. Patient has no signs or symptoms of pneumonia.  Recent Lab Findings: Lab Results  Component Value Date   WBC 6.7 08/31/2015   HGB 10.7* 08/31/2015   HCT 32.5* 08/31/2015   PLT 168.0 08/31/2015   GLUCOSE 165* 08/31/2015   CHOL 170 12/02/2014   TRIG * 12/02/2014    449.0 Triglyceride is over 400; calculations on Lipids are invalid.   HDL 32.40* 12/02/2014   LDLDIRECT 93.0 12/02/2014   LDLCALC 94 06/17/2014   ALT 15* 08/18/2015   AST 19 08/18/2015   NA 135  08/31/2015   K 5.3* 08/31/2015   CL 97 08/31/2015   CREATININE 1.08 08/31/2015   BUN 28* 08/31/2015   CO2 32 08/31/2015   TSH 2.73 08/31/2015   INR 1.06 08/19/2015   HGBA1C 8.0* 08/18/2015       Assessment / Plan:   Status post transhiatal esophagectomy pathologic stage IIIB, question of preoperative pulmonary embolus, now off of anticoagulation because of GI bleed.  Patient is progressing reasonably well , I reviewed him precautions to take against reflux. He has a appointment with oncology at [facility] to consider immune therapy in the next couple of weeks. I plan to see him back with a chest x-ray in one month.  [name] MD      [address] [place] [address] Office [phone]   Beeper [phone]  09/28/2015 11:42 AM

## 2015-09-29 DIAGNOSIS — C159 Malignant neoplasm of esophagus, unspecified: Secondary | ICD-10-CM | POA: Diagnosis not present

## 2015-10-02 ENCOUNTER — Ambulatory Visit (INDEPENDENT_AMBULATORY_CARE_PROVIDER_SITE_OTHER): Payer: Medicare Other | Admitting: Endocrinology

## 2015-10-02 ENCOUNTER — Encounter: Payer: Self-pay | Admitting: Endocrinology

## 2015-10-02 VITALS — BP 138/86 | HR 98 | Temp 98.1°F | Ht 76.0 in | Wt 225.0 lb

## 2015-10-02 DIAGNOSIS — Z794 Long term (current) use of insulin: Secondary | ICD-10-CM

## 2015-10-02 DIAGNOSIS — C160 Malignant neoplasm of cardia: Secondary | ICD-10-CM | POA: Diagnosis not present

## 2015-10-02 DIAGNOSIS — F418 Other specified anxiety disorders: Secondary | ICD-10-CM | POA: Diagnosis not present

## 2015-10-02 DIAGNOSIS — E1151 Type 2 diabetes mellitus with diabetic peripheral angiopathy without gangrene: Secondary | ICD-10-CM

## 2015-10-02 DIAGNOSIS — I10 Essential (primary) hypertension: Secondary | ICD-10-CM | POA: Diagnosis not present

## 2015-10-02 LAB — CBC WITH DIFFERENTIAL/PLATELET
BASOS PCT: 0.7 % (ref 0.0–3.0)
Basophils Absolute: 0 10*3/uL (ref 0.0–0.1)
EOS ABS: 0.2 10*3/uL (ref 0.0–0.7)
Eosinophils Relative: 3.9 % (ref 0.0–5.0)
HCT: 30.6 % — ABNORMAL LOW (ref 39.0–52.0)
Hemoglobin: 10.1 g/dL — ABNORMAL LOW (ref 13.0–17.0)
Lymphocytes Relative: 25.6 % (ref 12.0–46.0)
Lymphs Abs: 1.2 10*3/uL (ref 0.7–4.0)
MCHC: 33.1 g/dL (ref 30.0–36.0)
MCV: 93.4 fl (ref 78.0–100.0)
MONO ABS: 0.3 10*3/uL (ref 0.1–1.0)
Monocytes Relative: 6.3 % (ref 3.0–12.0)
NEUTROS ABS: 3 10*3/uL (ref 1.4–7.7)
Neutrophils Relative %: 63.5 % (ref 43.0–77.0)
PLATELETS: 156 10*3/uL (ref 150.0–400.0)
RBC: 3.28 Mil/uL — ABNORMAL LOW (ref 4.22–5.81)
RDW: 17.2 % — AB (ref 11.5–15.5)
WBC: 4.7 10*3/uL (ref 4.0–10.5)

## 2015-10-02 LAB — BASIC METABOLIC PANEL
BUN: 18 mg/dL (ref 6–23)
CALCIUM: 9.4 mg/dL (ref 8.4–10.5)
CO2: 30 mEq/L (ref 19–32)
Chloride: 104 mEq/L (ref 96–112)
Creatinine, Ser: 1.01 mg/dL (ref 0.40–1.50)
GFR: 78.21 mL/min (ref >60.00)
Glucose, Bld: 142 mg/dL — ABNORMAL HIGH (ref 70–99)
POTASSIUM: 4.3 meq/L (ref 3.5–5.1)
SODIUM: 141 meq/L (ref 135–145)

## 2015-10-02 LAB — IBC PANEL
IRON: 60 ug/dL (ref 42–165)
Saturation Ratios: 21.8 % (ref 20.0–50.0)
Transferrin: 197 mg/dL — ABNORMAL LOW (ref 212.0–360.0)

## 2015-10-02 NOTE — Progress Notes (Signed)
Subjective:    Patient ID: Ronald Johnson, male    DOB: June 25, 1946, 70 y.o.   MRN: [id number]  HPI Pt returns for f/u of diabetes mellitus: DM type: Insulin-requiring type 2.  Dx'ed: 3382 Complications: polyneuropathy, PAD, and nephropathy.  Therapy: insulin since 2004.  DKA: never.  Severe hypoglycemia: never.   Pancreatitis: never.   Other: he changed to qd insulin, after he did not achieve good control on multiple daily injections; he cannot afford insulin.  analogs.  Interval history: He takes 0-200 units qam.  no cbg record, but states cbg's vary from 80-150.  It is in general higher as the day goes on.  He still finds it difficult to know his dosage, because he appetite is poor and variable from day to day.  HTN: hyzaar was d/c'ed in the hospital, due to lower BP.  Anemia: no further bleeding.  He does not takes fe tab, 1-BID. Depression persists.  He feels this is due to cancer.  He did not tolerate effexor (says it made him feel "detached"--paxil caused same sxs in the past).  No SI. Past Medical History  Diagnosis Date  . COLONIC POLYPS, ADENOMATOUS 08/01/2008, 2013, 2014  . DIABETES MELLITUS, TYPE I 03/13/2007  . HYPERCHOLESTEROLEMIA 02/03/2008  . Anemia 08/15/2009  . HYPERTENSION 03/13/2007  . PULMONARY NODULE 11/24/2008  . GERD 03/13/2007  . TOBACCO ABUSE 11/24/2008  . EMPHYSEMA 11/08/2008  . Prostatism   . ED (erectile dysfunction)   . [name]'s esophagus 2007  . Hiatal hernia   . Diverticulitis   . Arthritis   . COPD (chronic obstructive pulmonary disease) ([place])   . Anxiety   . Depression   . History of blood transfusion 08/18/2015    due to GIB  . GIB (gastrointestinal bleeding) 08/18/2015  . Esophageal cancer ([place]) "dx'd ~ 01/2015"  . Stomach cancer ([place]) "dx'd ~ 01/2015"    Past Surgical History  Procedure Laterality Date  . Egd  11/19/2005  . Foot fracture surgery Right 1980    right foot w/ pins and screws  . Removal pins and screws foot  1980    right foot    . Upper gastrointestinal endoscopy    . Circumcision    . Colonoscopy    . Polypectomy    . Vasectomy    . Eus N/A 03/16/2015    Procedure: UPPER ENDOSCOPIC ULTRASOUND (EUS) RADIAL;  Surgeon: Milus Banister, MD;  Location: [facility];  Service: Endoscopy;  Laterality: N/A;  . Complete esophagectomy N/A 07/25/2015    Procedure: TRANSHIATAL TOTAL ESOPHAGECTOMY COMPLETE PYLOROMYOTOMY;  Surgeon: [name], MD;  Location: [facility];  Service: Thoracic;  Laterality: N/A;  . Jejunostomy N/A 07/25/2015    Procedure: FEEDING JEJUNOSTOMY;  Surgeon: [name], MD;  Location: [place] [place];  Service: Thoracic;  Laterality: N/A;  . Video bronchoscopy N/A 07/25/2015    Procedure: VIDEO BRONCHOSCOPY;  Surgeon: [name], MD;  Location: [facility];  Service: Thoracic;  Laterality: N/A;    Social History   Social History  . Marital Status: Married    Spouse Name: N/A  . Number of Children: 2  . Years of Education: N/A   Occupational History  . INSPECTOR     works Youth worker   Social History Main Topics  . Smoking status: Former Smoker -- 1.00 packs/day for 35 years    Types: Cigarettes    Quit date: 10/31/2008  . Smokeless tobacco: Never Used  . Alcohol Use: 0.0 oz/week  0 Standard drinks or equivalent per week     Comment: 2/month only on vacation  . Drug Use: No  . Sexual Activity: Not Currently   Other Topics Concern  . 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 atorvastatin (LIPITOR) 10 MG tablet Take 1 tablet by mouth  every day (Patient taking differently: Take 1 tablet by mouth  every evening) 90 tablet 1  . diazepam (VALIUM) 5 MG tablet Take 1 tablet (5 mg total) by mouth 2 (two) times daily as needed. (Patient  taking differently: Take 5 mg by mouth 2 (two) times daily as needed for anxiety. ) 180 tablet 1  . esomeprazole (NEXIUM) 40 MG capsule Take 1 capsule (40 mg total) by mouth 2 (two) times daily before a meal. 60 capsule 1  . feeding supplement, ENSURE ENLIVE, (ENSURE ENLIVE) LIQD Take 237 mLs by mouth 2 (two) times daily between meals. 237 mL 12  . glucose blood (PRECISION XTRA TEST STRIPS) test strip Check blood sugar three times a day dx 250.01 270 each 3  . insulin NPH Human (HUMULIN N) 100 UNIT/ML injection Inject 2 mLs (200 Units total) into the skin daily before breakfast. 9 vial 3  . Insulin Syringe-Needle U-100 30G X 3/8" 1 ML MISC Use to inject insulin 3 times daily. 300 each 1  . Nutritional Supplements (FEEDING SUPPLEMENT, VITAL AF 1.2 CAL,) LIQD Place 1,000 mLs into feeding tube continuous. At 70 ml/hr from 7pm -7am 1000 mL 12  . sildenafil (REVATIO) 20 MG tablet TAKE 1-5 TABLETS BY MOUTH ONCE DAILY AS NEEDED 50 tablet 1  . venlafaxine XR (EFFEXOR-XR) 75 MG 24 hr capsule Take 3 capsules (225 mg total) by mouth daily with breakfast. (Patient not taking: Reported on 10/02/2015) 90 capsule 11  . [DISCONTINUED] metoprolol succinate (TOPROL-XL) 25 MG 24 hr tablet Take 25 mg by mouth daily.      No current facility-administered medications on file prior to visit.    No Known Allergies  Family History  Problem Relation Age of Onset  . Stroke Mother   . Diabetes Mother   . Colon cancer Neg Hx   . Diabetes Maternal Grandmother     mother side of the family aunts, MGF  . Breast cancer Paternal Grandmother     BP 138/86 mmHg  Pulse 98  Temp(Src) 98.1 F (36.7 C) (Oral)  Ht '6\' 4"'$  (1.93 m)  Wt 225 lb (102.059 kg)  BMI 27.40 kg/m2  SpO2 96%     Review of Systems No weight change    Objective:   Physical Exam VITAL SIGNS:  See vs page GENERAL: no distress Pulses: dorsalis pedis intact bilat.  MSK: no deformity of the feet CV: no leg edema Skin: no ulcer on the feet, but  the skin is dry. normal color and temp on the feet. Neuro: sensation is intact to touch on the feet.   Lab Results  Component Value Date   WBC 4.7 10/02/2015   HGB 10.1* 10/02/2015   HCT 30.6* 10/02/2015   MCV 93.4 10/02/2015   PLT 156.0 10/02/2015      Assessment & Plan:  HTN: no rx is needed now Depression: persistent. Anemia: persistent: I advised pt ti  continue fe tabs. DM: uncertain control.  Check fructosamine.  Patient is advised the following: Patient Instructions  blood tests are requested for you today.  We'll let you know about the results. Please stay off the hyzaar for now.  Please see a psychology specialist.  you will receive a phone call, about a day and time for an appointment. Please come back for a follow-up appointment in 1 month.

## 2015-10-02 NOTE — Patient Instructions (Addendum)
blood tests are requested for you today.  We'll let you know about the results. Please stay off the hyzaar for now.  Please see a psychology specialist.  you will receive a phone call, about a day and time for an appointment. Please come back for a follow-up appointment in 1 month.

## 2015-10-04 LAB — FRUCTOSAMINE: FRUCTOSAMINE: 259 umol/L (ref 190–270)

## 2015-10-06 ENCOUNTER — Other Ambulatory Visit: Payer: Self-pay | Admitting: Endocrinology

## 2015-10-06 ENCOUNTER — Other Ambulatory Visit: Payer: Self-pay | Admitting: Gastroenterology

## 2015-10-06 NOTE — Telephone Encounter (Signed)
Please advise if ok to refill. Medication is not on current med list.  

## 2015-10-09 DIAGNOSIS — C155 Malignant neoplasm of lower third of esophagus: Secondary | ICD-10-CM | POA: Diagnosis not present

## 2015-10-10 ENCOUNTER — Telehealth: Payer: Self-pay | Admitting: *Deleted

## 2015-10-10 NOTE — Telephone Encounter (Signed)
Oncology Nurse Navigator Documentation  Oncology Nurse Navigator Flowsheets 10/10/2015  Navigator Location [facility]  Navigator Encounter Type Telephone  Telephone Incoming Call;Outgoing Call;Education  Patient Visit Type -  Treatment Phase -  Barriers/Navigation Needs -Asking if he agrees to the trial at [facility], is there any way he can know if he is getting nivolumab or the placebo?  Education -  Interventions -  Referrals -  Education Method -Verbal: informed him no, they can't tell him which he is getting.  Support Groups/Services -  Acuity -  Time Spent with Patient 15   Reports that the side effect profile concerns him and he does not want to pursue the trial at [facility]. Also feels it involves too much time on his part. Tells RN that he still has difficulty swallowing some things, but weight is holding.

## 2015-10-10 NOTE — Telephone Encounter (Signed)
He needs to ask Dr. Fanny Skates whether he will know his treatment arm.

## 2015-10-11 ENCOUNTER — Encounter: Payer: Self-pay | Admitting: Internal Medicine

## 2015-10-12 ENCOUNTER — Telehealth: Payer: Self-pay | Admitting: Endocrinology

## 2015-10-12 NOTE — Telephone Encounter (Signed)
Pt said [facility] told him that they needed him to contact us about the recent Rx that was sent into them

## 2015-10-13 NOTE — Telephone Encounter (Signed)
Message left to call the office.    KP 

## 2015-10-16 NOTE — Telephone Encounter (Signed)
I contacted the pt and left a voicemail advising the pt to call us back and advise what information [facility] needs.

## 2015-10-17 ENCOUNTER — Ambulatory Visit: Payer: Medicare Other | Admitting: Endocrinology

## 2015-10-25 ENCOUNTER — Encounter: Payer: Self-pay | Admitting: *Deleted

## 2015-10-25 ENCOUNTER — Other Ambulatory Visit: Payer: Self-pay | Admitting: Cardiothoracic Surgery

## 2015-10-25 DIAGNOSIS — I2699 Other pulmonary embolism without acute cor pulmonale: Secondary | ICD-10-CM

## 2015-10-25 NOTE — Progress Notes (Signed)
[place] Work  Clinical Social Work was referred by patient for assessment of psychosocial needs due to "needing to talk with someone".  Clinical Social Worker spoke with patient via phone to offer support and assess for needs.  Pt interested in counseling and support. CSW and pt made appointment to meet for counseling on Monday at 10am. Pt agrees to reach out if needs arise prior to Monday.   Loren Racer, [place] Worker [place]  [place] Phone: [phone] Fax: [phone]

## 2015-10-26 ENCOUNTER — Encounter: Payer: Self-pay | Admitting: Cardiothoracic Surgery

## 2015-10-26 ENCOUNTER — Other Ambulatory Visit: Payer: Self-pay | Admitting: Cardiothoracic Surgery

## 2015-10-26 ENCOUNTER — Ambulatory Visit
Admission: RE | Admit: 2015-10-26 | Discharge: 2015-10-26 | Disposition: A | Discharge status: Home / Self Care | Payer: Medicare Other | Source: Ambulatory Visit | Attending: Cardiothoracic Surgery | Admitting: Cardiothoracic Surgery

## 2015-10-26 ENCOUNTER — Ambulatory Visit (INDEPENDENT_AMBULATORY_CARE_PROVIDER_SITE_OTHER): Payer: Self-pay | Admitting: Cardiothoracic Surgery

## 2015-10-26 VITALS — BP 115/71 | HR 92 | Resp 20 | Ht 76.0 in | Wt 223.0 lb

## 2015-10-26 DIAGNOSIS — C159 Malignant neoplasm of esophagus, unspecified: Secondary | ICD-10-CM

## 2015-10-26 DIAGNOSIS — I2699 Other pulmonary embolism without acute cor pulmonale: Secondary | ICD-10-CM

## 2015-10-26 DIAGNOSIS — Z9889 Other specified postprocedural states: Secondary | ICD-10-CM

## 2015-10-26 DIAGNOSIS — Z8501 Personal history of malignant neoplasm of esophagus: Secondary | ICD-10-CM | POA: Diagnosis not present

## 2015-10-26 DIAGNOSIS — Z9049 Acquired absence of other specified parts of digestive tract: Secondary | ICD-10-CM

## 2015-10-26 NOTE — Progress Notes (Signed)
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 SURGICAL PROCEDURE: Bronchoscopy, transhiatal total esophagectomy with cervical esophagogastrostomy, pyloromyotomy, and placement of jejunal feeding tube. SURGEON: Lanelle Bal, MD.  GE junction carcinoma [facility])   Staging form: Stomach, AJCC 7th Edition     Clinical: Stage IIB (T3, N1, M0) - Signed by Ladell Pier, MD on 03/28/2015   Staging form: Esophagus - Adenocarcinoma, AJCC 7th Edition     Pathologic stage from 07/26/2015: Stage IIIB (yT3, N2, cM0, G3 - Poorly differentiated) - Signed by [name], MD on 07/28/2015  History of Present Illness:     Patient returns to the office today after transhiatal esophagectomy cervical esophagogastrostomy pyloromyotomy and placement of  jejunal feeding tube.  Currently he is taking a by mouth diet well. His weight is stabilized at approximately 221 pounds.  a follow-up Gastrografin swallow was obtained that showed no as evidence of anastomotic stricture.  The patient does complain of symptoms of bile reflux special he at night, with bitter taste in his mouth.  The patient has returned to local gym and started doing increasing exercise, which he has tolerated without difficulty  He was seen at [facility] oncology for second opinion about postop treatment and also for consideration of clinical trials. Patient noted that he decided not to proceed with any clinical trial.   Past Medical History  Diagnosis Date  . COLONIC POLYPS, ADENOMATOUS  08/01/2008, 2013, 2014  . DIABETES MELLITUS, TYPE I 03/13/2007  . HYPERCHOLESTEROLEMIA 02/03/2008  . Anemia 08/15/2009  . HYPERTENSION 03/13/2007  . PULMONARY NODULE 11/24/2008  . GERD 03/13/2007  . TOBACCO ABUSE 11/24/2008  . EMPHYSEMA 11/08/2008  . Prostatism   . ED (erectile dysfunction)   . [name]'s esophagus 2007  . Hiatal hernia   . Diverticulitis   . Arthritis   . COPD (chronic obstructive pulmonary disease) ([place])   . Anxiety   . Depression   . History of blood transfusion 08/18/2015    due to GIB  . GIB (gastrointestinal bleeding) 08/18/2015  . Esophageal cancer ([place]) "dx'd ~ 01/2015"  . Stomach cancer ([place]) "dx'd ~ 01/2015"     History  Smoking status  . Former Smoker -- 1.00 packs/day for 35 years  . Types: Cigarettes  . Quit date: 10/31/2008  Smokeless tobacco  . Never Used    History  Alcohol Use  . 0.0 oz/week  . 0 Standard drinks or equivalent per week    Comment: 2/month only on vacation     No Known Allergies  Current Outpatient Prescriptions  Medication Sig Dispense Refill  . atorvastatin (LIPITOR) 10 MG tablet Take 1 tablet by mouth  every day (Patient taking differently: Take 1 tablet by mouth  every evening) 90 tablet 1  . diazepam (VALIUM) 5 MG tablet Take 1 tablet (5 mg total) by mouth 2 (two) times daily as needed. (Patient taking differently: Take 5 mg by mouth 2 (two) times daily as needed for anxiety. ) 180 tablet 1  . esomeprazole (NEXIUM) 40 MG capsule  Take 1 capsule (40 mg total) by mouth 2 (two) times daily before a meal. 60 capsule 1  . feeding supplement, ENSURE ENLIVE, (ENSURE ENLIVE) LIQD Take 237 mLs by mouth 2 (two) times daily between meals. 237 mL 12  . glucose blood (PRECISION XTRA TEST STRIPS) test strip Check blood sugar three times a day dx 250.01 270 each 3  . insulin NPH Human (HUMULIN N) 100 UNIT/ML injection Inject 2 mLs (200 Units total) into the skin daily before breakfast. 9 vial 3  . Insulin Syringe-Needle U-100 30G X 3/8" 1  ML MISC Use to inject insulin 3 times daily. 300 each 1  . Nutritional Supplements (FEEDING SUPPLEMENT, VITAL AF 1.2 CAL,) LIQD Place 1,000 mLs into feeding tube continuous. At 70 ml/hr from 7pm -7am 1000 mL 12  . sildenafil (REVATIO) 20 MG tablet TAKE 1-5 TABLETS BY MOUTH ONCE DAILY AS NEEDED 50 tablet 1  . [DISCONTINUED] metoprolol succinate (TOPROL-XL) 25 MG 24 hr tablet Take 25 mg by mouth daily.      No current facility-administered medications for this visit.    Wt Readings from Last 3 Encounters:  10/26/15 223 lb (101.152 kg)  10/02/15 225 lb (102.059 kg)  09/28/15 221 lb (100.245 kg)     Physical Exam: BP 115/71 mmHg  Pulse 92  Resp 20  Ht 6\' 4"  (1.93 m)  Wt 223 lb (101.152 kg)  BMI 27.16 kg/m2  SpO2 95%  General appearance: alert and cooperative Neurologic: intact Heart: regular rate and rhythm, S1, S2 normal, no murmur, click, rub or gallop Lungs: clear to auscultation bilaterally Abdomen: soft, non-tender; bowel sounds normal; no masses,  no organomegaly Extremities: extremities normal, atraumatic, no cyanosis or edema and Homans sign is negative, no sign of DVT Wound: Neck incision is well-healed abdominal incision is well-healed,  Feeding tube site is well-healed  Diagnostic Studies & Laboratory data:     Recent Radiology Findings:  Dg Esophagus  09/18/2015  CLINICAL DATA:  Malignant neoplasm lower third of esophagus. Esophagectomy with gastric pull-through. Dysphasia. EXAM: ESOPHOGRAM/BARIUM SWALLOW TECHNIQUE: Single contrast examination was performed using  thin barium. FLUOROSCOPY TIME:  Radiation Exposure Index (as provided by the fluoroscopic device): If the device does not provide the exposure index: Fluoroscopy Time:  3 Min and 0 seconds Number of Acquired Images: COMPARISON:  Esophagram 08/01/2015 FINDINGS: Postsurgical findings of esophagectomy and gastric pull-through. The anastomosis is widely patent without stricture or mass. No aspiration identified.  The thoracic stomach is patent without obstruction. No mucosal edema or ulceration identified. Barium tablet passed readily into the stomach without delay at the anastomosis. IMPRESSION: Satisfactory postoperative appearance of esophagectomy and gastric pull-through. No stricture or mass identified. Electronically Signed   By: Franchot Gallo M.D.   On: 09/18/2015 13:24    Dg Chest 2 View  10/26/2015  CLINICAL DATA:  History esophageal cancer with prior surgery EXAM: CHEST  2 VIEW COMPARISON:  09/28/2015 FINDINGS: Cardiac shadow is within normal limits. The lungs are well aerated bilaterally. No focal infiltrate or sizable effusion is seen. No bony abnormality is noted. IMPRESSION: No acute abnormality noted. Electronically Signed   By: Inez Catalina M.D.   On: 10/26/2015 11:07    I have independently reviewed the above radiology studies  and reviewed the findings with the patient. Patient has no signs or symptoms of pneumonia.  Recent Lab Findings: Lab Results  Component Value Date   WBC 4.7 10/02/2015   HGB 10.1* 10/02/2015   HCT 30.6* 10/02/2015   PLT  156.0 10/02/2015   GLUCOSE 142* 10/02/2015   CHOL 170 12/02/2014   TRIG * 12/02/2014    449.0 Triglyceride is over 400; calculations on Lipids are invalid.   HDL 32.40* 12/02/2014   LDLDIRECT 93.0 12/02/2014   LDLCALC 94 06/17/2014   ALT 15* 08/18/2015   AST 19 08/18/2015   NA 141 10/02/2015   K 4.3 10/02/2015   CL 104 10/02/2015   CREATININE 1.01 10/02/2015   BUN 18 10/02/2015   CO2 30 10/02/2015   TSH 2.73 08/31/2015   INR 1.06 08/19/2015   HGBA1C 8.0* 08/18/2015       Assessment / Plan:   Status post transhiatal esophagectomy pathologic stage IIIB, Patient is progressing reasonably well , I reviewed him precautions to take against reflux. He Had a appointment with oncology at [facility] to consider immune therapy clinical trial but has decided against this I plan to see him back with a chest x-ray in 3 months , and then coordinate  with Dr. [name] follow-up CT scans.   [name] MD      [address] [place] [address] Office [phone]   Beeper [phone]  10/26/2015 12:13 PM

## 2015-10-29 NOTE — Progress Notes (Signed)
Subjective:    Patient ID: Ronald Johnson, male    DOB: 05-09-1946, 70 y.o.   MRN: [id number]  HPI Pt returns for f/u of diabetes mellitus: DM type: Insulin-requiring type 2.  Dx'ed: 6270 Complications: polyneuropathy, PAD, and nephropathy.  Therapy: insulin since 2004.  DKA: never.  Severe hypoglycemia: never.   Pancreatitis: never.   Other: he changed to qd insulin, after he did not achieve good control on multiple daily injections; he cannot afford insulin.  analogs.  Interval history: He takes 100-200 units qam.  no cbg record, but states cbg's vary from 70-300.  It is in general higher as the day goes on.  He still finds it difficult to know his dosage, because he appetite is poor and variable from day to day.  HTN: hyzaar was d/c'ed in the hospital, due to lower BP.  Anemia: no further bleeding.  He does not takes fe tab, 1-BID. Past Medical History  Diagnosis Date  . COLONIC POLYPS, ADENOMATOUS 08/01/2008, 2013, 2014  . DIABETES MELLITUS, TYPE I 03/13/2007  . HYPERCHOLESTEROLEMIA 02/03/2008  . Anemia 08/15/2009  . HYPERTENSION 03/13/2007  . PULMONARY NODULE 11/24/2008  . GERD 03/13/2007  . TOBACCO ABUSE 11/24/2008  . EMPHYSEMA 11/08/2008  . Prostatism   . ED (erectile dysfunction)   . [name]'s esophagus 2007  . Hiatal hernia   . Diverticulitis   . Arthritis   . COPD (chronic obstructive pulmonary disease) ([place])   . Anxiety   . Depression   . History of blood transfusion 08/18/2015    due to GIB  . GIB (gastrointestinal bleeding) 08/18/2015  . Esophageal cancer ([place]) "dx'd ~ 01/2015"  . Stomach cancer ([place]) "dx'd ~ 01/2015"    Past Surgical History  Procedure Laterality Date  . Egd  11/19/2005  . Foot fracture surgery Right 1980    right foot w/ pins and screws  . Removal pins and screws foot  1980    right foot  . Upper gastrointestinal endoscopy    . Circumcision    . Colonoscopy    . Polypectomy    . Vasectomy    . Eus N/A 03/16/2015    Procedure: UPPER  ENDOSCOPIC ULTRASOUND (EUS) RADIAL;  Surgeon: Milus Banister, MD;  Location: [facility];  Service: Endoscopy;  Laterality: N/A;  . Complete esophagectomy N/A 07/25/2015    Procedure: TRANSHIATAL TOTAL ESOPHAGECTOMY COMPLETE PYLOROMYOTOMY;  Surgeon: [name], MD;  Location: [facility];  Service: Thoracic;  Laterality: N/A;  . Jejunostomy N/A 07/25/2015    Procedure: FEEDING JEJUNOSTOMY;  Surgeon: [name], MD;  Location: [facility];  Service: Thoracic;  Laterality: N/A;  . Video bronchoscopy N/A 07/25/2015    Procedure: VIDEO BRONCHOSCOPY;  Surgeon: [name], MD;  Location: [facility];  Service: Thoracic;  Laterality: N/A;    Social History   Social History  . Marital Status: Married    Spouse Name: N/A  . Number of Children: 2  . Years of Education: N/A   Occupational History  . INSPECTOR     works Youth worker   Social History Main Topics  . Smoking status: Former Smoker -- 1.00 packs/day for 35 years    Types: Cigarettes    Quit date: 10/31/2008  . Smokeless tobacco: Never Used  . Alcohol Use: 0.0 oz/week    0 Standard drinks or equivalent per week     Comment: 2/month only on vacation  . Drug Use: No  . Sexual Activity: Not Currently   Other  Topics Concern  . 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 atorvastatin (LIPITOR) 10 MG tablet Take 1 tablet by mouth  every day (Patient taking differently: Take 1 tablet by mouth  every evening) 90 tablet 1  . diazepam (VALIUM) 5 MG tablet Take 1 tablet (5 mg total) by mouth 2 (two) times daily as needed. (Patient taking differently: Take 5 mg by mouth 2 (two) times daily as needed for anxiety. ) 180 tablet 1  . glucose blood (PRECISION XTRA TEST STRIPS) test strip  Check blood sugar three times a day dx 250.01 270 each 3  . Insulin Syringe-Needle U-100 30G X 3/8" 1 ML MISC Use to inject insulin 3 times daily. 300 each 1  . sildenafil (REVATIO) 20 MG tablet TAKE 1-5 TABLETS BY MOUTH ONCE DAILY AS NEEDED 50 tablet 1  . [DISCONTINUED] metoprolol succinate (TOPROL-XL) 25 MG 24 hr tablet Take 25 mg by mouth daily.      No current facility-administered medications on file prior to visit.    No Known Allergies  Family History  Problem Relation Age of Onset  . Stroke Mother   . Diabetes Mother   . Colon cancer Neg Hx   . Diabetes Maternal Grandmother     mother side of the family aunts, MGF  . Breast cancer Paternal Grandmother     BP 122/80 mmHg  Pulse 84  Temp(Src) 98.6 F (37 C) (Oral)  Resp 14  Wt 227 lb (102.967 kg)  SpO2 94%  Review of Systems Denies abd pain.  He has gained 2 lbs since last ov.     Objective:   Physical Exam VITAL SIGNS:  See vs page GENERAL: no distress SKIN:  Insulin injection sites at the anterior abdomen are normal.     A1c=7.3%    Assessment & Plan:  DM: this is the best control this pt should aim for, given this regimen, which does match insulin to her changing needs throughout the day. HTN: no rx needed now.     Patient is advised the following: Patient Instructions  Please continue the same iron pills for now.   Please continue to stay off the hyzaar for now.  Take 160 units each morning, no matter what your blood sugar is.  Please come back for a follow-up appointment in 4 months (must be after 03/02/16).   check your blood sugar twice a day.  vary the time of day when you check, between before the 3 meals, and at bedtime.  also check if you have symptoms of your blood sugar being too high or too low.  please keep a record of the readings and bring it to your next appointment here (or you can bring the meter itself).  You can write it on any piece of paper.  please call us sooner if your blood sugar  goes below 70, or if you have a lot of readings over 200.

## 2015-10-30 ENCOUNTER — Encounter: Payer: Self-pay | Admitting: Endocrinology

## 2015-10-30 ENCOUNTER — Encounter: Payer: Self-pay | Admitting: *Deleted

## 2015-10-30 ENCOUNTER — Ambulatory Visit (INDEPENDENT_AMBULATORY_CARE_PROVIDER_SITE_OTHER): Payer: Medicare Other | Admitting: Endocrinology

## 2015-10-30 ENCOUNTER — Other Ambulatory Visit (INDEPENDENT_AMBULATORY_CARE_PROVIDER_SITE_OTHER): Payer: Medicare Other | Admitting: *Deleted

## 2015-10-30 VITALS — BP 122/80 | HR 84 | Temp 98.6°F | Resp 14 | Wt 227.0 lb

## 2015-10-30 DIAGNOSIS — E1151 Type 2 diabetes mellitus with diabetic peripheral angiopathy without gangrene: Secondary | ICD-10-CM

## 2015-10-30 DIAGNOSIS — Z794 Long term (current) use of insulin: Secondary | ICD-10-CM

## 2015-10-30 LAB — POCT GLYCOSYLATED HEMOGLOBIN (HGB A1C): Hemoglobin A1C: 7.3

## 2015-10-30 MED ORDER — INSULIN NPH (HUMAN) (ISOPHANE) 100 UNIT/ML ~~LOC~~ SUSP: 160.0000 [IU] | SUBCUTANEOUS | Status: DC

## 2015-10-30 MED ORDER — ESOMEPRAZOLE MAGNESIUM 40 MG PO CPDR: 40.0000 mg | DELAYED_RELEASE_CAPSULE | Freq: Two times a day (BID) | ORAL | Status: DC

## 2015-10-30 NOTE — Progress Notes (Signed)
[place] Work  Clinical Social Work was referred by patient for assessment of psychosocial needs and counseling session.  Clinical Social Worker met with patient at [facility] in [place] office to offer support and assess for needs.  Pt has completed treatment and shared he was having difficulty "moving forward" after treatment. CSW and pt reviewed common emotions at this phase and possible coping techniques. Pt appears eager to move forward and discover "the [place]" that has been changed through cancer. Pt very open to further exploring options for coping and possible ways to find joy. Pt signed up for Finding Your New Normal after Head and Neck cancer treatment. This starts April 25. He is also interested in continuing to attend GI Group. He has found that very helpful. He also is interested in exploring various exercise options. Pt opened to continued counseling and pt and CSW meet again April 3. Pt plans to explore additional coping techniques. Pt aware to reach out if needed prior that day.   Clinical Social Work interventions: Supportive counseling Resource and referral  Ronald Johnson, [place] Worker [place]  [place] Phone: [phone] Fax: [phone]

## 2015-10-30 NOTE — Patient Instructions (Addendum)
Please continue the same iron pills for now.   Please continue to stay off the hyzaar for now.  Take 160 units each morning, no matter what your blood sugar is.  Please come back for a follow-up appointment in 4 months (must be after 03/02/16).   check your blood sugar twice a day.  vary the time of day when you check, between before the 3 meals, and at bedtime.  also check if you have symptoms of your blood sugar being too high or too low.  please keep a record of the readings and bring it to your next appointment here (or you can bring the meter itself).  You can write it on any piece of paper.  please call us sooner if your blood sugar goes below 70, or if you have a lot of readings over 200.

## 2015-11-20 ENCOUNTER — Encounter: Payer: Self-pay | Admitting: *Deleted

## 2015-11-20 NOTE — Progress Notes (Signed)
[place] Work  Clinical Social Work met with pt for supportive counseling session. Pt working towards processing his cancer experience and successfully finding ways to process his emotions. Pt shared he is less stressed and he has found several new ways to work through his emotions. He reports cancer has changed his relationship with his wife as well. Pt continues to plan to attend GI group and will consider mentioning counseling as an option for his wife as well. CSW provided pt with handout re counseling with [facility] interns. Pt doing well, plans to attend [facility], will call to set up additional counseling if needed in the future.    Clinical Social Work interventions: Supportive counseling session  Loren Racer, [place] Worker [place]  [place] Phone: [phone] Fax: [phone]

## 2015-12-04 ENCOUNTER — Telehealth: Payer: Self-pay | Admitting: Internal Medicine

## 2015-12-04 NOTE — Telephone Encounter (Signed)
Pt states he is having a problem with "bile" coming up his throat at night. States it burns and tastes terrible. Pt states he is taking nexium 40mg  daily. Pt wants to know what else he can do. Please advise.

## 2015-12-05 NOTE — Telephone Encounter (Signed)
Mylanta or Maalox as needed

## 2015-12-05 NOTE — Telephone Encounter (Signed)
Spoke with pt and he is aware. 

## 2015-12-27 DIAGNOSIS — L280 Lichen simplex chronicus: Secondary | ICD-10-CM | POA: Diagnosis not present

## 2016-01-01 ENCOUNTER — Telehealth: Payer: Self-pay | Admitting: Endocrinology

## 2016-01-01 MED ORDER — ESOMEPRAZOLE MAGNESIUM 40 MG PO CPDR: 40.0000 mg | DELAYED_RELEASE_CAPSULE | Freq: Two times a day (BID) | ORAL | Status: DC

## 2016-01-01 NOTE — Telephone Encounter (Signed)
PT said that he needs Nexium sent into [facility] for twice a day.  He thinks Dr. Loanne Drilling prescribed it.

## 2016-01-01 NOTE — Telephone Encounter (Signed)
Rx submitted per pt's request.  

## 2016-01-10 ENCOUNTER — Other Ambulatory Visit: Payer: Self-pay | Admitting: Endocrinology

## 2016-01-11 ENCOUNTER — Other Ambulatory Visit: Payer: Self-pay | Admitting: Gastroenterology

## 2016-01-11 ENCOUNTER — Other Ambulatory Visit: Payer: Self-pay | Admitting: Endocrinology

## 2016-01-11 NOTE — Telephone Encounter (Signed)
Ov would be needed to consider this med

## 2016-01-11 NOTE — Telephone Encounter (Signed)
Patient need a paper prescription for medication diazepam (VALIUM) 5 MG tablet and esomeprazole (NEXIUM) 40 MG capsule  send to [place], [place] [phone] (Phone) [phone] (Fax)

## 2016-01-12 ENCOUNTER — Other Ambulatory Visit: Payer: Self-pay | Admitting: *Deleted

## 2016-01-12 ENCOUNTER — Ambulatory Visit: Payer: Self-pay | Admitting: *Deleted

## 2016-01-12 DIAGNOSIS — E0800 Diabetes mellitus due to underlying condition with hyperosmolarity without nonketotic hyperglycemic-hyperosmolar coma (NKHHC): Secondary | ICD-10-CM

## 2016-01-12 NOTE — Patient Outreach (Incomplete)
Per patient he has been a diabetic for

## 2016-01-19 ENCOUNTER — Encounter: Payer: Self-pay | Admitting: *Deleted

## 2016-01-19 ENCOUNTER — Telehealth: Payer: Self-pay | Admitting: Oncology

## 2016-01-19 ENCOUNTER — Telehealth: Payer: Self-pay | Admitting: Internal Medicine

## 2016-01-19 ENCOUNTER — Ambulatory Visit (HOSPITAL_BASED_OUTPATIENT_CLINIC_OR_DEPARTMENT_OTHER): Payer: Medicare Other | Admitting: Nurse Practitioner

## 2016-01-19 VITALS — BP 135/93 | HR 81 | Temp 97.6°F | Resp 18 | Wt 228.5 lb

## 2016-01-19 DIAGNOSIS — F329 Major depressive disorder, single episode, unspecified: Secondary | ICD-10-CM

## 2016-01-19 DIAGNOSIS — R45851 Suicidal ideations: Secondary | ICD-10-CM | POA: Diagnosis not present

## 2016-01-19 DIAGNOSIS — C160 Malignant neoplasm of cardia: Secondary | ICD-10-CM | POA: Diagnosis not present

## 2016-01-19 NOTE — Telephone Encounter (Signed)
Pt scheduled to see Alonza Bogus PA 01/23/16@3 :15pm. Please notify pt of appt.

## 2016-01-19 NOTE — Progress Notes (Addendum)
[place] OFFICE PROGRESS NOTE   Diagnosis:  Esophagus cancer  INTERVAL HISTORY:   Mr. Basaldua returns as scheduled. He reports a good appetite. He is experiencing dysphagia and regurgitation of solids. He reports a negative swallow evaluation earlier this year. He denies pain. Bowels are moving. He is active. He has occasional reflux and tastes "bile" in his mouth. His main complaint today is depression. He has been on Effexor in the past with no improvement. He felt like a "zombie" while taking Effexor. He reports he has seen a psychiatrist in the past. He confirms suicidal ideations.  Objective:  Vital signs in last 24 hours:  Blood pressure 135/93, pulse 81, temperature 97.6 F (36.4 C), temperature source Oral, resp. rate 18, weight 228 lb 8 oz (103.647 kg), SpO2 95 %.    HEENT: No thrush or ulcers. Lymphatics: Round, mobile approximate 1 cm high right anterior cervical lymph node versus cyst (patient reports this has been present since he was a teenager and is unchanged). No palpable left cervical, supraclavicular, axillary or inguinal lymph nodes. Resp: Lungs clear bilaterally. Cardio: Regular rate and rhythm. GI: Abdomen soft and nontender. No hepatomegaly. Vascular: No leg edema. Neuro: Alert and oriented.    Lab Results:  Lab Results  Component Value Date   WBC 4.7 10/02/2015   HGB 10.1* 10/02/2015   HCT 30.6* 10/02/2015   MCV 93.4 10/02/2015   PLT 156.0 10/02/2015   NEUTROABS 3.0 10/02/2015    Imaging:  No results found.  Medications: I have reviewed the patient's current medications.  Assessment/Plan: 1. GE junction carcinoma-adenocarcinoma, status post an endoscopic biopsy 03/08/2015  EUS 03/16/2015 confirmed a clinical stage IIb (uT3,uN1) tumor  Staging CTs of the chest, abdomen, and pelvis with no evidence of metastatic disease  Initiation of radiation 04/19/2015, cycle 1 Taxol/carboplatin 04/20/2015: Radiation completed 05/29/2015;  the fifth and final week of Taxol/carboplatin 05/18/2015  Restaging CTs 07/20/2015 revealed no evidence of metastatic disease, incidental left lower lobe pulmonary embolism  Transhiatal total esophagectomy 07/25/2015 confirmed a ypT3,ypN2 with 4/5 positive lymph nodes, lymphovascular invasion, perineural invasion, negative resection margins  2. Postprandial subxiphoid pain secondary to #1  3. Diabetes-elevated blood sugar readings at the [facility] and at home  4. COPD  5. Depression  6. Hypertension  7. History of adenomatous colon polyps  8. History of diabetic related neuropathy, peripheral arterial disease, and nephropathy  9. Nausea-likely related to radiation esophagitis/gastritis  10. Incidental pulmonary embolism noted on the CT 07/20/2015, treated with Xarelto  11. Admission 08/18/2015 with GI bleeding while on Xarelto, anticoagulation therapy discontinued  12. Dysphagia and regurgitation of solids. Barium swallow 09/18/2015 with no stricture or mass identified. We made a referral to Dr. Henrene Pastor.   Disposition: Mr. Ferg remains in clinical remission from the esophagus cancer. The high right anterior cervical question lymph node, question cyst is likely benign as per his report has been present since he was a teenager and is unchanged.  He is experiencing depression. He has seen a psychiatrist in the past. He expressed suicidal ideations while in the office today. The [place] social worker will evaluate him today and we will decide on the next appropriate step. He is agreeable to a referral to psychiatry.  With regard to the dysphagia and regurgitation of solids we made a referral to Dr. Henrene Pastor.  Mr. Parlee will return for a follow-up visit here in 3 months. As noted above the main issue today is depression, suicidal ideation. We are awaiting  input from our Education officer, museum.   Please see progress note by Loren Racer dated today.  Patient seen with Dr.  [name]. 25 minutes were spent face-to-face at today's visit with the majority of that time involved in counseling/coordination of care.  Ned Card ANP/GNP-BC   01/19/2016  9:57 AM   This was a shared visit with Ned Card. Mr. Ferrelli remains in clinical remission from esophagus cancer. We are concerned about his depression symptoms. He met with the [place] clinical social worker today and will be referred for outpatient psychiatry follow-up.  Julieanne Manson, M.D.

## 2016-01-19 NOTE — Telephone Encounter (Signed)
per pof to sch pt appt-gave pt copy of avs °

## 2016-01-19 NOTE — Progress Notes (Signed)
[facility] Suicide Risk Assessment Clinical Social Work  Clinical Social Work was referred by Lisa Thomas for suicidal ideation. Pt has had thoughts of suicide and has had issues with ongoing depression. CSW met with pt and his wife Marianne at length. Pt agrees to contact outpatient psych provider/[facility] for outpatient follow up today. CSW provided pt and wife with psycho-education and packet of local mental health resources. Pt denies active plan and denies means in the home. Wife plans to supervise pt over the weekend and assist in making sure pt contacts local psych provider for follow up.   Clinical Social Worker and patient discussed plan and completed safety contract. The patient identified the following plan of action which patient agrees to utilize should suicidal ideation increase or continue to occur:  1) Contact wife, Marianne or son, Robert. Pt does not plan to be alone over weekend.   2) Call 911 or have wife or Son call 911. or 3) Go to [facility] for psychiatric evaluation or 4) Call [facility] mental health crisis line at [phone]  MD notified of safety contract.  Pt provided copy of safety contract and copy has been sent to medical records department to be scanned in to patients medical record.     Grier Hock, LCSW Clinical Social Worker [facility]  [facility] Phone: [phone] Fax: [phone]       

## 2016-01-19 NOTE — Patient Outreach (Addendum)
[facility]) Care Management  [facility] Care Manager  Late entry [id number]  Ronald Johnson [birth year] [id number]  [place] telephone call to patient.  Hip[employer] compliance verified. Per patient his blood sugar is 225 non fasting. patientstates that when his blood sugar is low he feels tires and disoriented . He stated that he eats a little food. Per patient when his blood sugar is high he feels flushed and hot. Per patient he does not eat. Per patient he feels better when his blood sugars are a little high. Per patient usually around 150-250 feel good. Per patient his appetite is not good. He drinks boost mostly for his nutrition. Per patient he can eat some soft foods like potatoes. . Patient stated he is on nexium and the cost is very high. Patient is not on the generic brand .     Objective:   Encounter Medications:  Outpatient Encounter Prescriptions as of 01/12/2016  Medication Sig  . atorvastatin (LIPITOR) 10 MG tablet Take 1 tablet by mouth  every day  . diazepam (VALIUM) 5 MG tablet Take 1 tablet (5 mg total) by mouth 2 (two) times daily as needed. (Patient taking differently: Take 5 mg by mouth 2 (two) times daily as needed for anxiety. )  . esomeprazole (NEXIUM) 40 MG capsule Take 1 capsule (40 mg total) by mouth 2 (two) times daily before a meal.  . glucose blood (PRECISION XTRA TEST STRIPS) test strip Check blood sugar three times a day dx 250.01  . insulin NPH Human (HUMULIN N) 100 UNIT/ML injection Inject 1.6 mLs (160 Units total) into the skin every morning.  . Insulin Syringe-Needle U-100 30G X 3/8" 1 ML MISC Use to inject insulin 3 times daily.  . sildenafil (REVATIO) 20 MG tablet TAKE 1-5 TABLETS BY MOUTH ONCE DAILY AS NEEDED   No facility-administered encounter medications on file as of 01/12/2016.    Functional Status:  In your present state of health, do you have any difficulty performing the following activities: 01/12/2016 08/18/2015   Hearing? N -  Vision? Y -  Difficulty concentrating or making decisions? N -  Walking or climbing stairs? N -  Dressing or bathing? N -  Doing errands, shopping? N Y  Conservation officer, nature and eating ? N -  Using the Toilet? N -  In the past six months, have you accidently leaked urine? N -  Do you have problems with loss of bowel control? N -  Managing your Medications? N -  Managing your Finances? N -  Housekeeping or managing your Housekeeping? N -    Fall/Depression Screening: PHQ 2/9 Scores 01/12/2016  PHQ - 2 Score 2  PHQ- 9 Score 4   THN CM Care Plan Problem One        Most Recent Value   Care Plan Problem One  Knowledge deficit in self management ofDiabetes   Role Documenting the Problem One  Bureau for Problem One  Active   THN Long Term Goal (31-90 days)  Patient hgb A1C will decrease from 7.3 within the next 30 days   THN Long Term Goal Start Date  01/12/16   Interventions for Problem One [place] reminded patient to keep appointments with physicians. RN  reminded patient the importance of taking medications as per physician order. RN will monitor patient monthly telephonic   THN CM Short Term Goal #1 (0-30 days)  Patient will be able to  verbalize signs and symptoms of hypo and hyperglycemia within the next 30 days   THN CM Short Term Goal #1 Start Date  01/12/16   Interventions for Short Term Goal #1  RN will send patient a picture chart of signs and symptoms of hyper and hjyperglycemia. RN will send patient EMMI educational material and low and high blood sugars. RN will follow up with discussion and teach back   THN CM Short Term Goal #2 (0-30 days)  Patient will be able to verbalize an action plan for high and low blood sugars within the next 30 days   THN CM Short Term Goal #2 Start Date  01/12/16   Interventions for Short Term Goal #2  RN sent educational material on treating high and low blood sugar. RN will follow up with  discussion and teach back   THN CM Short Term Goal #3 (0-30 days)  Patient will be able to verbalize why he gets his A1c checked within the next 30 days   THN CM Short Term Goal #3 Start Date  01/12/16   Interventions for Short Tern Goal #3  RN sent educational material on Managing diabetes the A1C test. How the A1c test helps. RN sent EMMI educational material on Why get A1C checked. RN will follow with discussion and teach back     Assessment:  Patient will benefit from Health Coach telephonic outreach for education and support for diabetes self management.  Plan:  RN sent educational information on A1C RN sent educational material on hypo and hyperglycemia RN sen educational material on dysphagia 2 diet RN send educational information on soft foods to eat and foods to prevent gastric reflux RN will follow up within 30 days for discussion and teach back RN referred to [place] Management [phone]

## 2016-01-19 NOTE — Telephone Encounter (Signed)
Dr. Henrene Pastor patient  Patient with dysphagia and regurgitation, with hx of esophogeal cancer.  Barium swallow should no blockage, but with hx of cancer, would like input on urgency of scheduling.

## 2016-01-23 ENCOUNTER — Ambulatory Visit: Payer: Medicare Other | Admitting: Gastroenterology

## 2016-01-23 ENCOUNTER — Encounter: Payer: Self-pay | Admitting: Gastroenterology

## 2016-01-23 ENCOUNTER — Ambulatory Visit (INDEPENDENT_AMBULATORY_CARE_PROVIDER_SITE_OTHER): Payer: Medicare Other | Admitting: Gastroenterology

## 2016-01-23 VITALS — BP 126/70 | HR 84 | Ht 76.0 in | Wt 227.0 lb

## 2016-01-23 DIAGNOSIS — E119 Type 2 diabetes mellitus without complications: Secondary | ICD-10-CM | POA: Diagnosis not present

## 2016-01-23 DIAGNOSIS — C159 Malignant neoplasm of esophagus, unspecified: Secondary | ICD-10-CM | POA: Diagnosis not present

## 2016-01-23 DIAGNOSIS — Z794 Long term (current) use of insulin: Secondary | ICD-10-CM

## 2016-01-23 DIAGNOSIS — E11649 Type 2 diabetes mellitus with hypoglycemia without coma: Secondary | ICD-10-CM | POA: Insufficient documentation

## 2016-01-23 DIAGNOSIS — R1310 Dysphagia, unspecified: Secondary | ICD-10-CM

## 2016-01-23 DIAGNOSIS — IMO0001 Reserved for inherently not codable concepts without codable children: Secondary | ICD-10-CM

## 2016-01-23 NOTE — Progress Notes (Signed)
     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 Servando Snare.  The patient is here today with complaints of dysphagia. He says that this is been occurring from that time he had surgery. Has dysphagia to solid foods only; liquids go down just fine. He says that this occurs with about everything that he eats unless he completely purees it in mouth. It is painful and embarrassing. He had an esophagram 09/18/2015 that showed a widely patent anastomosis. He was sent here for [place] to consider dilation. He also complains of severe bile reflux since his surgery. He has had 6-8 episodes of this occurring at nighttime. He is on Nexium twice a day and has tried Carafate for his symptoms without improvement.  Current Medications, Allergies, Past Medical History, Past Surgical History, Family History and Social History were reviewed in [employer] record.   Physical Exam: BP 126/70 mmHg  Pulse 84  Ht 6\' 4"  (1.93 m)  Wt 227 lb (102.967 kg)  BMI 27.64 kg/m2 General: Well developed white male in no acute distress Head: Normocephalic and atraumatic Eyes:  Sclerae anicteric, conjunctiva pink  Ears: Normal auditory acuity Lungs: Clear throughout to auscultation Heart: Regular rate and rhythm Abdomen: Soft, non-distended.  Normal bowel sounds.  Non-tender.  Scar noted on upper abdomen. Musculoskeletal: Symmetrical with no gross deformities  Extremities: No edema  Neurological: Alert oriented x 4, grossly non-focal Psychological:  Alert and cooperative. Normal mood and affect  Assessment and Recommendations: -70 year old male status post chemotherapy, radiation, and surgery for GE junction cancer. Has been experiencing solid food dysphagia since his surgery in December 2016. He had a normal esophagram in January. I discussed  with Dr. Henrene Pastor who is willing to attempt possible balloon dilation of the anastomosis to see if this will help his symptoms.  Unfortunately for his complaints of bile reflux, this is due to changes in anatomy and there is really nothing more that we can do to try to fix that.  The risks, benefits, and alternatives to EGD with dilation were discussed with the patient and he consents to proceed. -IDDM:  Insulin will be adjusted prior to endoscopic procedure per protocol. Will resume normal dosing after procedure.

## 2016-01-23 NOTE — Patient Instructions (Signed)
If you are age 68 or older, your body mass index should be between 23-30. Your Body mass index is 27.64 kg/(m^2). If this is out of the aforementioned range listed, please consider follow up with your Primary Care Provider.  If you are age 33 or younger, your body mass index should be between 19-25. Your Body mass index is 27.64 kg/(m^2). If this is out of the aformentioned range listed, please consider follow up with your Primary Care Provider.   You have been scheduled for an endoscopy. Please follow written instructions given to you at your visit today. If you use inhalers (even only as needed), please bring them with you on the day of your procedure. Your physician has requested that you go to www.startemmi.com and enter the access code given to you at your visit today. This web site gives a general overview about your procedure. However, you should still follow specific instructions given to you by our office regarding your preparation for the procedure.

## 2016-01-24 ENCOUNTER — Other Ambulatory Visit: Payer: Self-pay | Admitting: Cardiothoracic Surgery

## 2016-01-24 DIAGNOSIS — C159 Malignant neoplasm of esophagus, unspecified: Secondary | ICD-10-CM

## 2016-01-24 NOTE — Progress Notes (Signed)
Case discussed. May have anastomotic stricture. Not much to do about regurgitation issues other than lifestyle modifications. Agree with plans for EGD with dilation if necessary

## 2016-01-25 ENCOUNTER — Ambulatory Visit (INDEPENDENT_AMBULATORY_CARE_PROVIDER_SITE_OTHER): Payer: Medicare Other | Admitting: Cardiothoracic Surgery

## 2016-01-25 ENCOUNTER — Encounter: Payer: Self-pay | Admitting: Cardiothoracic Surgery

## 2016-01-25 ENCOUNTER — Ambulatory Visit
Admission: RE | Admit: 2016-01-25 | Discharge: 2016-01-25 | Disposition: A | Discharge status: Home / Self Care | Payer: Medicare Other | Source: Ambulatory Visit | Attending: Cardiothoracic Surgery | Admitting: Cardiothoracic Surgery

## 2016-01-25 VITALS — BP 138/85 | HR 79 | Resp 20 | Ht 76.0 in | Wt 226.0 lb

## 2016-01-25 DIAGNOSIS — C159 Malignant neoplasm of esophagus, unspecified: Secondary | ICD-10-CM | POA: Diagnosis not present

## 2016-01-25 NOTE — Patient Instructions (Signed)
Can try for bile reflux: Carafate Cholestyramine

## 2016-01-25 NOTE — Progress Notes (Signed)
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 SURGICAL PROCEDURE: Bronchoscopy, transhiatal total esophagectomy with cervical esophagogastrostomy, pyloromyotomy, and placement of jejunal feeding tube. SURGEON: Lanelle Bal, MD.  GE junction carcinoma [facility])   Staging form: Stomach, AJCC 7th Edition     Clinical: Stage IIB (T3, N1, M0) - Signed by Ladell Pier, MD on 03/28/2015   Staging form: Esophagus - Adenocarcinoma, AJCC 7th Edition     Pathologic stage from 07/26/2015: Stage IIIB (yT3, N2, cM0, G3 - Poorly differentiated) - Signed by [name], MD on 07/28/2015  History of Present Illness:     Patient returns to the office today after transhiatal esophagectomy cervical esophagogastrostomy pyloromyotomy and placement of  jejunal feeding tube.  Currently he is taking a by mouth diet well. He is getting 5 or 6 pounds since last seen. He has had several episodes of bile reflux and will come up at night, he also notes that at times certain foods "stick in his throat". He has seen gastroenterology and has endoscopy and possible dilatation of the anastomosis scheduled for next week. In 09/18/2015 a Gastrografin swallow showed no evidence of stricture  The patient does complain of symptoms of bile reflux special he at night, with bitter taste in his mouth.  The patient has returned to local gym and started doing increasing exercise, which he has tolerated without difficulty  He was seen at [facility] oncology for second opinion about  postop treatment and also for consideration of clinical trials. Patient noted that he decided not to proceed with any clinical trial.   Past Medical History  Diagnosis Date  . COLONIC POLYPS, ADENOMATOUS 08/01/2008, 2013, 2014  . DIABETES MELLITUS, TYPE I 03/13/2007  . HYPERCHOLESTEROLEMIA 02/03/2008  . Anemia 08/15/2009  . HYPERTENSION 03/13/2007  . PULMONARY NODULE 11/24/2008  . GERD 03/13/2007  . TOBACCO ABUSE 11/24/2008  . EMPHYSEMA 11/08/2008  . Prostatism   . ED (erectile dysfunction)   . [name]'s esophagus 2007  . Hiatal hernia   . Diverticulitis   . Arthritis   . COPD (chronic obstructive pulmonary disease) ([place])   . Anxiety   . Depression   . History of blood transfusion 08/18/2015    due to GIB  . GIB (gastrointestinal bleeding) 08/18/2015  . Esophageal cancer ([place]) "dx'd ~ 01/2015"  . Stomach cancer ([place]) "dx'd ~ 01/2015"     History  Smoking status  . Former Smoker -- 1.00 packs/day for 35 years  . Types: Cigarettes  . Quit date: 10/31/2008  Smokeless tobacco  . Never Used    History  Alcohol Use  . 0.0 oz/week  . 0 Standard drinks or equivalent per week    Comment: 2/month only on vacation     No Known Allergies  Current Outpatient Prescriptions  Medication Sig Dispense Refill  . atorvastatin (LIPITOR) 10 MG tablet Take 1 tablet by mouth  every day 90 tablet 1  . diazepam (VALIUM) 5 MG tablet Take 1 tablet (5 mg total) by mouth 2 (two) times daily  as needed. (Patient taking differently: Take 5 mg by mouth 2 (two) times daily as needed for anxiety. ) 180 tablet 1  . esomeprazole (NEXIUM) 40 MG capsule Take 1 capsule (40 mg total) by mouth 2 (two) times daily before a meal. 180 capsule 3  . glucose blood (PRECISION XTRA TEST STRIPS) test strip Check blood sugar three times a day dx 250.01 270 each 3  . insulin NPH Human (HUMULIN N) 100 UNIT/ML injection Inject 1.6 mLs (160 Units total) into the skin every morning. 6 vial 11  . Insulin Syringe-Needle U-100 30G X  3/8" 1 ML MISC Use to inject insulin 3 times daily. 300 each 1  . sildenafil (REVATIO) 20 MG tablet TAKE 1-5 TABLETS BY MOUTH ONCE DAILY AS NEEDED 50 tablet 1  . [DISCONTINUED] metoprolol succinate (TOPROL-XL) 25 MG 24 hr tablet Take 25 mg by mouth daily.      No current facility-administered medications for this visit.    Wt Readings from Last 3 Encounters:  01/25/16 226 lb (102.513 kg)  01/23/16 227 lb (102.967 kg)  01/19/16 228 lb 8 oz (103.647 kg)   Filed Weights   01/25/16 1104  Weight: 226 lb (102.513 kg)    Physical Exam: BP 138/85 mmHg  Pulse 79  Resp 20  Ht 6\' 4"  (1.93 m)  Wt 226 lb (102.513 kg)  BMI 27.52 kg/m2  SpO2 96%  General appearance: alert and cooperative Neurologic: intact Heart: regular rate and rhythm, S1, S2 normal, no murmur, click, rub or gallop Lungs: clear to auscultation bilaterally Abdomen: soft, non-tender; bowel sounds normal; no masses,  no organomegaly Extremities: extremities normal, atraumatic, no cyanosis or edema and Homans sign is negative, no sign of DVT Wound: Neck incision is well-healed abdominal incision is well-healed,Is no cervical or supraclavicular adenopathy  Feeding tube site is well-healed and closed  Diagnostic Studies & Laboratory data:     Recent Radiology Findings:  Dg Esophagus  09/18/2015  CLINICAL DATA:  Malignant neoplasm lower third of esophagus. Esophagectomy with gastric pull-through. Dysphasia. EXAM: ESOPHOGRAM/BARIUM SWALLOW TECHNIQUE: Single contrast examination was performed using  thin barium. FLUOROSCOPY TIME:  Radiation Exposure Index (as provided by the fluoroscopic device): If the device does not provide the exposure index: Fluoroscopy Time:  3 Min and 0 seconds Number of Acquired Images: COMPARISON:  Esophagram 08/01/2015 FINDINGS: Postsurgical findings of esophagectomy and gastric pull-through. The anastomosis is widely patent without stricture or mass. No aspiration identified. The thoracic stomach is  patent without obstruction. No mucosal edema or ulceration identified. Barium tablet passed readily into the stomach without delay at the anastomosis. IMPRESSION: Satisfactory postoperative appearance of esophagectomy and gastric pull-through. No stricture or mass identified. Electronically Signed   By: Franchot Gallo M.D.   On: 09/18/2015 13:24    Dg Chest 2 View  01/25/2016  CLINICAL DATA:  Esophageal cancer EXAM: CHEST  2 VIEW COMPARISON:  10/26/2015 FINDINGS: Cardiomediastinal silhouette is stable. No acute infiltrate or pleural effusion. No pulmonary edema. Stable mild degenerative changes thoracic spine. IMPRESSION: No active cardiopulmonary disease. Electronically Signed   By: Lahoma Crocker M.D.   On: 01/25/2016 10:40    I have independently reviewed the above radiology studies  and reviewed the findings with the patient. Patient has no signs or symptoms of pneumonia.  Recent Lab Findings: Lab Results  Component Value Date   WBC 4.7 10/02/2015   HGB 10.1* 10/02/2015   HCT 30.6* 10/02/2015   PLT 156.0 10/02/2015   GLUCOSE 142* 10/02/2015   CHOL  170 12/02/2014   TRIG * 12/02/2014    449.0 Triglyceride is over 400; calculations on Lipids are invalid.   HDL 32.40* 12/02/2014   LDLDIRECT 93.0 12/02/2014   LDLCALC 94 06/17/2014   ALT 15* 08/18/2015   AST 19 08/18/2015   NA 141 10/02/2015   K 4.3 10/02/2015   CL 104 10/02/2015   CREATININE 1.01 10/02/2015   BUN 18 10/02/2015   CO2 30 10/02/2015   TSH 2.73 08/31/2015   INR 1.06 08/19/2015   HGBA1C 7.3 10/30/2015       Assessment / Plan:   Status post transhiatal esophagectomy pathologic stage IIIB, Patient is progressing reasonably well , I reviewed him precautions to take against reflux. We can try Carafate or  Cholestyramine for significant bile reflux. We'll await endoscopy findings neck suite before changing any therapy, if he does have an anastomotic stricture early and frequent dilatations is the most effective long-term  therapy. Plan to see him back in 6 months.   [name] MD      [address] [place] [address] Office [phone]   Beeper [phone]  01/25/2016 12:09 PM

## 2016-01-26 ENCOUNTER — Encounter: Payer: Self-pay | Admitting: *Deleted

## 2016-01-26 DIAGNOSIS — F4321 Adjustment disorder with depressed mood: Secondary | ICD-10-CM | POA: Diagnosis not present

## 2016-01-26 NOTE — Progress Notes (Signed)
Ronald Johnson  Clinical Social Johnson phoned pt to follow up on coping and reassess needs. CSW spoke with pt's wife as pt was at a counseling appointment. Wife, Ronald Johnson shared that pt was currently at the [facility] for a counseling session and seemed to be improved in mood from last week.  She felt his depression had improved. Wife also reports that pt was encouraged by plans and follow up appointments that he had with his surgeon. CSW encouraged wife/pt to please reach out for additional needs and support as needed. CSW will continue to follow and reassess as able.    Clinical Social Johnson interventions: Follow up  Ronald Johnson, [place] Worker [place]  [facility] Phone: [phone] Fax: [phone]

## 2016-01-31 ENCOUNTER — Encounter: Payer: Self-pay | Admitting: *Deleted

## 2016-01-31 ENCOUNTER — Ambulatory Visit (AMBULATORY_SURGERY_CENTER): Payer: Medicare Other | Admitting: Internal Medicine

## 2016-01-31 ENCOUNTER — Encounter: Payer: Self-pay | Admitting: Internal Medicine

## 2016-01-31 VITALS — BP 135/79 | HR 72 | Temp 98.0°F | Resp 14 | Ht 76.0 in | Wt 227.0 lb

## 2016-01-31 DIAGNOSIS — K222 Esophageal obstruction: Secondary | ICD-10-CM

## 2016-01-31 DIAGNOSIS — E669 Obesity, unspecified: Secondary | ICD-10-CM | POA: Diagnosis not present

## 2016-01-31 DIAGNOSIS — E119 Type 2 diabetes mellitus without complications: Secondary | ICD-10-CM | POA: Diagnosis not present

## 2016-01-31 DIAGNOSIS — J449 Chronic obstructive pulmonary disease, unspecified: Secondary | ICD-10-CM | POA: Diagnosis not present

## 2016-01-31 DIAGNOSIS — R1310 Dysphagia, unspecified: Secondary | ICD-10-CM

## 2016-01-31 DIAGNOSIS — K219 Gastro-esophageal reflux disease without esophagitis: Secondary | ICD-10-CM | POA: Diagnosis not present

## 2016-01-31 DIAGNOSIS — F419 Anxiety disorder, unspecified: Secondary | ICD-10-CM | POA: Diagnosis not present

## 2016-01-31 LAB — GLUCOSE, CAPILLARY
GLUCOSE-CAPILLARY: 200 mg/dL — AB (ref 65–99)
Glucose-Capillary: 138 mg/dL — ABNORMAL HIGH (ref 65–99)

## 2016-01-31 MED ORDER — SODIUM CHLORIDE 0.9 % IV SOLN: 500.0000 mL | INTRAVENOUS | Status: DC

## 2016-01-31 MED ORDER — OMEPRAZOLE 40 MG PO CPDR: 40.0000 mg | DELAYED_RELEASE_CAPSULE | Freq: Two times a day (BID) | ORAL | Status: DC

## 2016-01-31 NOTE — Progress Notes (Signed)
Report to PACU, RN, vss, BBS= Clear.  

## 2016-01-31 NOTE — Op Note (Signed)
[facility] Patient Name: Ronald Johnson Procedure Date: 01/31/2016 10:45 AM MRN: [id number] Endoscopist: Docia Chuck. Henrene Pastor , MD Age: 70 Referring MD:  Date of Birth: [birth year] Gender: Male Procedure:                Upper GI endoscopy, with balloon dilation of                            esophagus Indications:              Dysphagia Medicines:                Monitored Anesthesia Care Procedure:                Pre-Anesthesia Assessment:                           - Prior to the procedure, a History and Physical                            was performed, and patient medications and                            allergies were reviewed. The patient's tolerance of                            previous anesthesia was also reviewed. The risks                            and benefits of the procedure and the sedation                            options and risks were discussed with the patient.                            All questions were answered, and informed consent                            was obtained. Prior Anticoagulants: The patient has                            taken no previous anticoagulant or antiplatelet                            agents. ASA Grade Assessment: III - A patient with                            severe systemic disease. After reviewing the risks                            and benefits, the patient was deemed in                            satisfactory condition to undergo the procedure.  After obtaining informed consent, the endoscope was                            passed under direct vision. Throughout the                            procedure, the patient's blood pressure, pulse, and                            oxygen saturations were monitored continuously. The                            Model [id number]-[id number]) scope was introduced                            through the mouth, and advanced to the second part                            of  duodenum. The upper GI endoscopy was                            accomplished without difficulty. The patient                            tolerated the procedure well. Scope In: Scope Out: Findings:                 One severe benign-appearing, intrinsic stenosis was                            found 20 cm from the incisors. This was at the                            surgical anastomosis and associated with                            inflammation. This measured 8 mm (inner diameter).                            The stricture was dilated with a TTS balloon at                            diameters of 13.5, 14.5, and 15.5 mm.The dilation                            site was examined following endoscope reinsertion                            and showed moderate improvement in luminal                            narrowing. Estimated blood loss was minimal.  The stomach was intrathoracic.                           The stomach mucosa was normal, with retained                            gastric contents.                           The examined duodenum was normal.                           The cardia and gastric fundus were normal on                            retroflexion, post surgery. Complications:            No immediate complications. Estimated Blood Loss:     Estimated blood loss: none. Impression:               - Anastomotic postoperative stricture status post                            balloon dilation                           - Status post esophagectomy for gastroesophageal                            carcinoma                           - Impaired gastric emptying (iatrogenic). Recommendation:           - Strict adherence to reflux precautions.                           - Chew food well.                           - Prescribe omeprazole 40 mg twice daily; #60; 11                            refills. Please take every day                           - Called Dr. Henrene Pastor in 2  weeks to let him know your                            swallowing is doing. Docia Chuck. Henrene Pastor, MD 01/31/2016 11:23:37 AM This report has been signed electronically. CC Letter to:             Percell Miller B. Servando Snare

## 2016-01-31 NOTE — Progress Notes (Signed)
Called to room to assist during endoscopic procedure.  Patient ID and intended procedure confirmed with present staff. Received instructions for my participation in the procedure from the performing physician.  

## 2016-01-31 NOTE — Patient Instructions (Signed)
YOU HAD AN ENDOSCOPIC PROCEDURE TODAY AT [facility]:   Refer to the procedure report that was given to you for any specific questions about what was found during the examination.  If the procedure report does not answer your questions, please call your gastroenterologist to clarify.  If you requested that your care partner not be given the details of your procedure findings, then the procedure report has been included in a sealed envelope for you to review at your convenience later.  YOU SHOULD EXPECT: Some feelings of bloating in the abdomen. Passage of more gas than usual.  Walking can help get rid of the air that was put into your GI tract during the procedure and reduce the bloating. If you had a lower endoscopy (such as a colonoscopy or flexible sigmoidoscopy) you may notice spotting of blood in your stool or on the toilet paper. If you underwent a bowel prep for your procedure, you may not have a normal bowel movement for a few days.  Please Note:  You might notice some irritation and congestion in your nose or some drainage.  This is from the oxygen used during your procedure.  There is no need for concern and it should clear up in a day or so.  SYMPTOMS TO REPORT IMMEDIATELY:     Following upper endoscopy (EGD)  Vomiting of blood or coffee ground material  New chest pain or pain under the shoulder blades  Painful or persistently difficult swallowing  New shortness of breath  Fever of 100F or higher  Black, tarry-looking stools  For urgent or emergent issues, a gastroenterologist can be reached at any hour by calling [phone].   DIET: Follow Dilation Diet.   ACTIVITY:  You should plan to take it easy for the rest of today and you should NOT DRIVE or use heavy machinery until tomorrow (because of the sedation medicines used during the test).    FOLLOW UP: Our staff will call the number listed on your records the next business day following your procedure to  check on you and address any questions or concerns that you may have regarding the information given to you following your procedure. If we do not reach you, we will leave a message.  However, if you are feeling well and you are not experiencing any problems, there is no need to return our call.  We will assume that you have returned to your regular daily activities without incident.  If any biopsies were taken you will be contacted by phone or by letter within the next 1-3 weeks.  Please call us at [phone] if you have not heard about the biopsies in 3 weeks.    SIGNATURES/CONFIDENTIALITY: You and/or your care partner have signed paperwork which will be entered into your electronic medical record.  These signatures attest to the fact that that the information above on your After Visit Summary has been reviewed and is understood.  Full responsibility of the confidentiality of this discharge information lies with you and/or your care-partner.  Resume medications. Call Dr. Henrene Pastor Office in 2 weeks to let him know how swallowing is doing.

## 2016-01-31 NOTE — Progress Notes (Signed)
[place] Work  Clinical Social Work was contacted by wife to report that pt went to the [facility] for counseling but then was told he "was too old to be treated there and needed geriatric psychiatrist". CSW problem solved with wife in attempt to locate alternate provider. CSW encouraged wife to contact [facility] for possible in-network providers. CSW also suggested possibility of [facility] as they provide both geriatric psych for medication management and have many LCSWs and additional counselors on staff. Pt currently asleep. Wife plans to relay information to pt and encourage him to follow up. CSW to continue to follow.   Clinical Social Work interventions: Resource coordination  Loren Racer, [place] Worker [place]  [place] Phone: [phone] Fax: [phone]

## 2016-02-01 ENCOUNTER — Telehealth: Payer: Self-pay | Admitting: *Deleted

## 2016-02-01 NOTE — Telephone Encounter (Signed)
  Follow up Call-  Call back number 01/31/2016 03/08/2015  Post procedure Call Back phone  # [phone]  Permission to leave phone message Yes Yes     Patient questions:  Do you have a fever, pain , or abdominal swelling? No. Pain Score  0 *  Have you tolerated food without any problems? Yes.    Have you been able to return to your normal activities? Yes.    Do you have any questions about your discharge instructions: Diet   No. Medications  No. Follow up visit  No.  Do you have questions or concerns about your Care? No.  Actions: * If pain score is 4 or above: No action needed, pain <4.

## 2016-02-09 ENCOUNTER — Other Ambulatory Visit: Payer: Self-pay | Admitting: *Deleted

## 2016-02-09 NOTE — Patient Outreach (Signed)
[facility]) Care Management  02/09/2016  Ronald Johnson 07-23-1946 [id number]   RN Health Coach attempted #1  Follow up outreach call to patient.  Patient was unavailable. No voicemail available for  Message to be left.  Plan: RN will call patient again within 14 days.    [place] Care Management [phone]

## 2016-02-12 ENCOUNTER — Ambulatory Visit: Payer: Medicare Other | Admitting: Psychology

## 2016-02-15 ENCOUNTER — Encounter: Payer: Self-pay | Admitting: *Deleted

## 2016-02-15 NOTE — Progress Notes (Signed)
Ronald Johnson and his wife attended the Spring 2017 [facility] survivorship program (Tuesday evenings, 6:00-7:15 pm, [facility], 4/25 - 01/09/2016).  Gayleen Orem, RN, BSN, [place] at [place] [phone]

## 2016-02-23 ENCOUNTER — Ambulatory Visit: Payer: Self-pay | Admitting: *Deleted

## 2016-02-26 ENCOUNTER — Telehealth: Payer: Self-pay | Admitting: Internal Medicine

## 2016-02-26 ENCOUNTER — Other Ambulatory Visit: Payer: Self-pay | Admitting: *Deleted

## 2016-02-26 ENCOUNTER — Other Ambulatory Visit: Payer: Self-pay

## 2016-02-26 DIAGNOSIS — R1310 Dysphagia, unspecified: Secondary | ICD-10-CM

## 2016-02-26 NOTE — Telephone Encounter (Signed)
Pt scheduled for EGD with dil at [facility] 03/19/16@8 :30am. Pt scheduled for previsit 02/28/16@10 :30am. Pt aware of appts and recommendations.

## 2016-02-26 NOTE — Patient Outreach (Signed)
[facility]) Care Management  02/26/2016  Ronald Johnson 08/10/1946 [id number]   [place] attempted  2nd Follow up outreach call to patient.  Patient was unavailable. HIPPA compliance voicemail message was left with return callback number.   Plan: RN will call patient again within 14 days.   [place] Care Management [phone]

## 2016-02-26 NOTE — Telephone Encounter (Signed)
Set up at Hospital for EGD with balloon dilation during morning hospital block August 1 (Tuesday). Needs MAC. Chew food extremely well until then. Continue on twice a day PPI indefinitely

## 2016-02-26 NOTE — Telephone Encounter (Signed)
Pt had egd with dil done 01/31/16. States he thinks he needs to have this done again as he is having difficulty swallowing food again. He is able to get his medication and liquids down. Please advise.

## 2016-02-28 ENCOUNTER — Ambulatory Visit (INDEPENDENT_AMBULATORY_CARE_PROVIDER_SITE_OTHER): Payer: Medicare Other | Admitting: Psychology

## 2016-02-28 ENCOUNTER — Other Ambulatory Visit: Payer: Self-pay | Admitting: Gastroenterology

## 2016-02-28 ENCOUNTER — Ambulatory Visit (AMBULATORY_SURGERY_CENTER): Payer: Self-pay

## 2016-02-28 VITALS — Ht 76.0 in | Wt 232.8 lb

## 2016-02-28 DIAGNOSIS — F4321 Adjustment disorder with depressed mood: Secondary | ICD-10-CM | POA: Diagnosis not present

## 2016-02-28 DIAGNOSIS — R1319 Other dysphagia: Secondary | ICD-10-CM

## 2016-02-28 NOTE — Progress Notes (Signed)
No allergies to eggs or soy No past problems with anesthesia No diet meds No home oxygen  Has email and internet; declined emmi 

## 2016-02-29 ENCOUNTER — Ambulatory Visit (INDEPENDENT_AMBULATORY_CARE_PROVIDER_SITE_OTHER): Payer: Medicare Other | Admitting: Endocrinology

## 2016-02-29 ENCOUNTER — Encounter: Payer: Self-pay | Admitting: Endocrinology

## 2016-02-29 VITALS — BP 118/78 | HR 92 | Ht 76.0 in | Wt 233.0 lb

## 2016-02-29 DIAGNOSIS — Z794 Long term (current) use of insulin: Secondary | ICD-10-CM

## 2016-02-29 DIAGNOSIS — D649 Anemia, unspecified: Secondary | ICD-10-CM | POA: Diagnosis not present

## 2016-02-29 DIAGNOSIS — E119 Type 2 diabetes mellitus without complications: Secondary | ICD-10-CM

## 2016-02-29 DIAGNOSIS — IMO0001 Reserved for inherently not codable concepts without codable children: Secondary | ICD-10-CM

## 2016-02-29 LAB — BASIC METABOLIC PANEL
BUN: 16 mg/dL (ref 6–23)
CALCIUM: 9.5 mg/dL (ref 8.4–10.5)
CO2: 32 mEq/L (ref 19–32)
Chloride: 108 mEq/L (ref 96–112)
Creatinine, Ser: 1.07 mg/dL (ref 0.40–1.50)
GFR: 73.08 mL/min (ref >60.00)
GLUCOSE: 178 mg/dL — AB (ref 70–99)
Potassium: 4.2 mEq/L (ref 3.5–5.1)
SODIUM: 136 meq/L (ref 135–145)

## 2016-02-29 LAB — CBC WITH DIFFERENTIAL/PLATELET
BASOS ABS: 0 10*3/uL (ref 0.0–0.1)
Basophils Relative: 0.7 % (ref 0.0–3.0)
Eosinophils Absolute: 0.1 10*3/uL (ref 0.0–0.7)
Eosinophils Relative: 3.3 % (ref 0.0–5.0)
HCT: 35.9 % — ABNORMAL LOW (ref 39.0–52.0)
Hemoglobin: 12.2 g/dL — ABNORMAL LOW (ref 13.0–17.0)
LYMPHS ABS: 1.4 10*3/uL (ref 0.7–4.0)
Lymphocytes Relative: 34.5 % (ref 12.0–46.0)
MCHC: 34 g/dL (ref 30.0–36.0)
MCV: 92.9 fl (ref 78.0–100.0)
MONO ABS: 0.3 10*3/uL (ref 0.1–1.0)
MONOS PCT: 6.5 % (ref 3.0–12.0)
NEUTROS PCT: 55 % (ref 43.0–77.0)
Neutro Abs: 2.2 10*3/uL (ref 1.4–7.7)
Platelets: 122 10*3/uL — ABNORMAL LOW (ref 150.0–400.0)
RBC: 3.87 Mil/uL — AB (ref 4.22–5.81)
RDW: 14.3 % (ref 11.5–15.5)
WBC: 4 10*3/uL (ref 4.0–10.5)

## 2016-02-29 LAB — IBC PANEL
IRON: 170 ug/dL — AB (ref 42–165)
SATURATION RATIOS: 52.3 % — AB (ref 20.0–50.0)
Transferrin: 232 mg/dL (ref 212.0–360.0)

## 2016-02-29 LAB — POCT GLYCOSYLATED HEMOGLOBIN (HGB A1C): HEMOGLOBIN A1C: 9.9

## 2016-02-29 MED ORDER — ESCITALOPRAM OXALATE 10 MG PO TABS: 10.0000 mg | ORAL_TABLET | Freq: Every day | ORAL | Status: DC

## 2016-02-29 MED ORDER — INSULIN NPH (HUMAN) (ISOPHANE) 100 UNIT/ML ~~LOC~~ SUSP: 180.0000 [IU] | SUBCUTANEOUS | Status: DC

## 2016-02-29 MED ORDER — DIAZEPAM 5 MG PO TABS: 5.0000 mg | ORAL_TABLET | Freq: Two times a day (BID) | ORAL | Status: DC | PRN

## 2016-02-29 NOTE — Patient Instructions (Addendum)
I have sent a prescription to your pharmacy, for "escitalopram," for the depression.  Please increase the insulin to 180 units each morning.   blood tests are requested for you today.  We'll let you know about the results.  check your blood sugar twice a day.  vary the time of day when you check, between before the 3 meals, and at bedtime.  also check if you have symptoms of your blood sugar being too high or too low.  please keep a record of the readings and bring it to your next appointment here (or you can bring the meter itself).  You can write it on any piece of paper.  please call us sooner if your blood sugar goes below 70, or if you have a lot of readings over 200. Please come back for a follow-up appointment in 3 months.

## 2016-02-29 NOTE — Progress Notes (Signed)
Subjective:    Patient ID: Ronald Johnson, male    DOB: 1946-05-03, 70 y.o.   MRN: [id number]  HPI Pt returns for f/u of diabetes mellitus: DM type: Insulin-requiring type 2.  Dx'ed: 2440 Complications: polyneuropathy, PAD, and nephropathy.  Therapy: insulin since 2004.  DKA: never.  Severe hypoglycemia: never.   Pancreatitis: never.   Other: he changed to qd insulin, after he did not achieve good control on multiple daily injections; he cannot afford insulin analogs.  Interval history: He takes 160 units qam.  no cbg record, but states cbg's vary from 250-350.  It is in general higher as the day goes on.  He still finds it difficult to know his dosage, because he appetite is poor and variable from day to day.  Anemia: no further bleeding.  He does not takes fe tabs   Depression persists.  No SI. I discussed with Dr Cheryln Manly.  Pt did not tolerate effexor.   Past Medical History  Diagnosis Date  . COLONIC POLYPS, ADENOMATOUS 08/01/2008, 2013, 2014  . DIABETES MELLITUS, TYPE I 03/13/2007  . HYPERCHOLESTEROLEMIA 02/03/2008  . Anemia 08/15/2009  . HYPERTENSION 03/13/2007    pt denies, claims white coat syndrome  . PULMONARY NODULE 11/24/2008  . GERD 03/13/2007  . TOBACCO ABUSE 11/24/2008  . Prostatism   . ED (erectile dysfunction)   . [name]'s esophagus 2007  . Hiatal hernia   . Diverticulitis   . Arthritis   . Anxiety   . Depression   . History of blood transfusion 08/18/2015    due to GIB  . GIB (gastrointestinal bleeding) 08/18/2015  . Esophageal cancer ([place]) "dx'd ~ 01/2015"  . Stomach cancer ([place]) "dx'd ~ 01/2015"  . EMPHYSEMA 11/08/2008    no per pt  . COPD (chronic obstructive pulmonary disease) ([facility])     no per pt    Past Surgical History  Procedure Laterality Date  . Egd  11/19/2005  . Foot fracture surgery Right 1980    right foot w/ pins and screws  . Removal pins and screws foot  1980    right foot  . Upper gastrointestinal endoscopy    . Circumcision    .  Colonoscopy    . Polypectomy    . Vasectomy    . Eus N/A 03/16/2015    Procedure: UPPER ENDOSCOPIC ULTRASOUND (EUS) RADIAL;  Surgeon: Milus Banister, MD;  Location: [facility];  Service: Endoscopy;  Laterality: N/A;  . Complete esophagectomy N/A 07/25/2015    Procedure: TRANSHIATAL TOTAL ESOPHAGECTOMY COMPLETE PYLOROMYOTOMY;  Surgeon: [name], MD;  Location: [place];  Service: Thoracic;  Laterality: N/A;  . Jejunostomy N/A 07/25/2015    Procedure: FEEDING JEJUNOSTOMY;  Surgeon: [name], MD;  Location: [facility];  Service: Thoracic;  Laterality: N/A;  . Video bronchoscopy N/A 07/25/2015    Procedure: VIDEO BRONCHOSCOPY;  Surgeon: [name], MD;  Location: [place] [place];  Service: Thoracic;  Laterality: N/A;    Social History   Social History  . Marital Status: Married    Spouse Name: N/A  . Number of Children: 2  . Years of Education: N/A   Occupational History  . INSPECTOR     works Youth worker   Social History Main Topics  . Smoking status: Former Smoker -- 1.00 packs/day for 35 years    Types: Cigarettes    Quit date: 10/31/2008  . Smokeless tobacco: Never Used  . Alcohol Use: No  . Drug Use: No  .  Sexual Activity: Not Currently   Other Topics Concern  . 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 atorvastatin (LIPITOR) 10 MG tablet Take 1 tablet by mouth  every day 90 tablet 1  . glucose blood (PRECISION XTRA TEST STRIPS) test strip Check blood sugar three times a day dx 250.01 270 each 3  . Insulin Syringe-Needle U-100 30G X 3/8" 1 ML MISC Use to inject insulin 3 times daily. 300 each 1  . sildenafil (REVATIO) 20 MG tablet TAKE 1-5 TABLETS BY MOUTH ONCE DAILY AS NEEDED 50 tablet 1  . omeprazole  (PRILOSEC) 40 MG capsule Take 1 capsule (40 mg total) by mouth 2 (two) times daily. 60 capsule 11  . [DISCONTINUED] metoprolol succinate (TOPROL-XL) 25 MG 24 hr tablet Take 25 mg by mouth daily.      No current facility-administered medications on file prior to visit.    No Known Allergies  Family History  Problem Relation Age of Onset  . Stroke Mother   . Diabetes Mother   . Colon cancer Neg Hx   . Esophageal cancer Neg Hx   . Rectal cancer Neg Hx   . Stomach cancer Neg Hx   . Diabetes Maternal Grandmother     mother side of the family aunts, MGF  . Breast cancer Paternal Grandmother     BP 118/78 mmHg  Pulse 92  Ht '6\' 4"'$  (1.93 m)  Wt 233 lb (105.688 kg)  BMI 28.37 kg/m2  SpO2 95%  Review of Systems Denies insomnia.  He has gained a few lbs.     Objective:   Physical Exam VITAL SIGNS:  See vs page GENERAL: no distress Pulses: dorsalis pedis intact bilat.   MSK: no deformity of the feet CV: no leg edema Skin:  no ulcer on the feet.  normal color and temp on the feet. Neuro: sensation is intact to touch on the feet   A1c=9.9%  Lab Results  Component Value Date   WBC 4.0 02/29/2016   HGB 12.2* 02/29/2016   HCT 35.9* 02/29/2016   MCV 92.9 02/29/2016   PLT 122.0* 02/29/2016      Assessment & Plan:  Anemia: improved: we'll follow Insulin-requiring type 2 DM: worse. Depression: worse.   Patient is advised the following: Patient Instructions  I have sent a prescription to your pharmacy, for "escitalopram," for the depression.  Please increase the insulin to 180 units each morning.   blood tests are requested for you today.  We'll let you know about the results.  check your blood sugar twice a day.  vary the time of day when you check, between before the 3 meals, and at bedtime.  also check if you have symptoms of your blood sugar being too high or too low.  please keep a record of the readings and bring it to your next appointment here (or you can bring the  meter itself).  You can write it on any piece of paper.  please call us sooner if your blood sugar goes below 70, or if you have a lot of readings over 200. Please come back for a follow-up appointment in 3 months.     [name], MD

## 2016-03-04 ENCOUNTER — Other Ambulatory Visit: Payer: Self-pay | Admitting: *Deleted

## 2016-03-04 ENCOUNTER — Other Ambulatory Visit: Payer: Self-pay | Admitting: Endocrinology

## 2016-03-04 ENCOUNTER — Encounter: Payer: Self-pay | Admitting: *Deleted

## 2016-03-04 NOTE — Patient Outreach (Addendum)
[facility]) Care Management  [place]  03/04/2016   Ronald Johnson Apr 06, 1946 [id number]  [place] telephone call to patient.  [place] compliance verified. Per patient his fasting blood sugar this am was 269. His A1C is 9.9.   Patient is not having any hyperglycemia reactions with the elevated blood sugars. Per patient it mostly runs high when he is under stress. Per patient his stress is mostly due to the  fact he wants to eat but gets choked on everything. Per patient he is to have his throat dilated on 03/19/2016. Patient stated his weight has actually increased even though he is unable to eat well.  Per patient he is also having some family stress. Patient was prescribed lyrica and is waiting on the mail order prescription to come.   Patient is excited about possibly returning to work.patient is going to an exercise program three times a week. Patient has agreed to follow up outreach calls.   Objective:   Encounter Medications:  Outpatient Encounter Prescriptions as of 03/04/2016  Medication Sig Note  . atorvastatin (LIPITOR) 10 MG tablet Take 1 tablet by mouth  every day   . diazepam (VALIUM) 5 MG tablet Take 1 tablet (5 mg total) by mouth 2 (two) times daily as needed for anxiety.   Marland Kitchen escitalopram (LEXAPRO) 10 MG tablet Take 1 tablet (10 mg total) by mouth daily. 03/04/2016: Waiting mail order to send  . glucose blood (PRECISION XTRA TEST STRIPS) test strip Check blood sugar three times a day dx 250.01   . insulin NPH Human (HUMULIN N) 100 UNIT/ML injection Inject 1.8 mLs (180 Units total) into the skin every morning.   . Insulin Syringe-Needle U-100 30G X 3/8" 1 ML MISC Use to inject insulin 3 times daily.   . sildenafil (REVATIO) 20 MG tablet TAKE 1-5 TABLETS BY MOUTH ONCE DAILY AS NEEDED   . omeprazole (PRILOSEC) 40 MG capsule Take 1 capsule (40 mg total) by mouth 2 (two) times daily.    No facility-administered encounter medications on file  as of 03/04/2016.    Functional Status:  In your present state of health, do you have any difficulty performing the following activities: 03/04/2016 01/12/2016  Hearing? N N  Vision? Y Y  Difficulty concentrating or making decisions? N N  Walking or climbing stairs? N N  Dressing or bathing? N N  Doing errands, shopping? N N  Preparing Food and eating ? - N  Using the Toilet? - N  In the past six months, have you accidently leaked urine? - N  Do you have problems with loss of bowel control? - N  Managing your Medications? - N  Managing your Finances? - N  Housekeeping or managing your Housekeeping? - N    Fall/Depression Screening: PHQ 2/9 Scores 03/04/2016 01/12/2016  PHQ - 2 Score 0 2  PHQ- 9 Score - 4    Assessment:  Patient will continue to  benefit from [place] telephonic outreach for education and support for diabetes self management.  Plan:  RN will send patient the Living Well with Diabetes Booklet RN will start first chapter and discuss with teach back RN will know his goal numbers on his A1C and the before and  2 hours after meal blood sugar range Patient will be able to verbalize why High blood sugars are a problem Patient will be able to verbalize how to reduce the risk of complications from Diabetes RN will send patient some Boost  and Glucerna coupons to sample the supplements RN will follow up within the month of August for discussion and teach back [place] Management [phone]

## 2016-03-13 ENCOUNTER — Encounter (HOSPITAL_COMMUNITY): Payer: Self-pay | Admitting: *Deleted

## 2016-03-13 ENCOUNTER — Ambulatory Visit (INDEPENDENT_AMBULATORY_CARE_PROVIDER_SITE_OTHER): Payer: Medicare Other | Admitting: Psychology

## 2016-03-13 DIAGNOSIS — F4320 Adjustment disorder, unspecified: Secondary | ICD-10-CM | POA: Diagnosis not present

## 2016-03-13 NOTE — Progress Notes (Signed)
Made dr singer anesthesia aware ekg 08-18-15 results, do not need to repeat ekg day of procedure 03-19-16 per dr singer

## 2016-03-15 ENCOUNTER — Telehealth: Payer: Self-pay | Admitting: Endocrinology

## 2016-03-15 ENCOUNTER — Other Ambulatory Visit: Payer: Self-pay | Admitting: Endocrinology

## 2016-03-15 ENCOUNTER — Other Ambulatory Visit: Payer: Self-pay

## 2016-03-15 MED ORDER — BUPROPION HCL ER (XL) 150 MG PO TB24: 150.0000 mg | ORAL_TABLET | Freq: Every day | ORAL | 3 refills | Status: DC

## 2016-03-15 MED ORDER — INSULIN NPH (HUMAN) (ISOPHANE) 100 UNIT/ML ~~LOC~~ SUSP: 180.0000 [IU] | SUBCUTANEOUS | 11 refills | Status: DC

## 2016-03-15 NOTE — Telephone Encounter (Signed)
PT needs his Insulin resubmitted to the [facility] [address]. He also states he cannot take the Lexapro due to side effects and prefers to have Wellbutrin called into [facility].

## 2016-03-15 NOTE — Telephone Encounter (Signed)
Please advise 

## 2016-03-15 NOTE — Telephone Encounter (Signed)
LMTRC

## 2016-03-15 NOTE — Telephone Encounter (Signed)
Ok, I sent wellbutrin to [facility] Please refill insulin to [facility], refill prn

## 2016-03-15 NOTE — Telephone Encounter (Signed)
INSULIN SENT

## 2016-03-19 ENCOUNTER — Ambulatory Visit (HOSPITAL_COMMUNITY): Payer: Medicare Other | Admitting: Anesthesiology

## 2016-03-19 ENCOUNTER — Encounter (HOSPITAL_COMMUNITY): Payer: Self-pay

## 2016-03-19 ENCOUNTER — Encounter (HOSPITAL_COMMUNITY)
Admission: RE | Disposition: A | Discharge status: Home / Self Care | Payer: Self-pay | Source: Ambulatory Visit | Attending: Internal Medicine

## 2016-03-19 ENCOUNTER — Ambulatory Visit (HOSPITAL_COMMUNITY)
Admission: RE | Admit: 2016-03-19 | Discharge: 2016-03-19 | Disposition: A | Discharge status: Home / Self Care | Payer: Medicare Other | Source: Ambulatory Visit | Attending: Internal Medicine | Admitting: Internal Medicine

## 2016-03-19 DIAGNOSIS — R1310 Dysphagia, unspecified: Secondary | ICD-10-CM

## 2016-03-19 DIAGNOSIS — Z8501 Personal history of malignant neoplasm of esophagus: Secondary | ICD-10-CM | POA: Insufficient documentation

## 2016-03-19 DIAGNOSIS — Z9221 Personal history of antineoplastic chemotherapy: Secondary | ICD-10-CM | POA: Insufficient documentation

## 2016-03-19 DIAGNOSIS — Z79899 Other long term (current) drug therapy: Secondary | ICD-10-CM | POA: Diagnosis not present

## 2016-03-19 DIAGNOSIS — Z87891 Personal history of nicotine dependence: Secondary | ICD-10-CM | POA: Insufficient documentation

## 2016-03-19 DIAGNOSIS — Z923 Personal history of irradiation: Secondary | ICD-10-CM | POA: Diagnosis not present

## 2016-03-19 DIAGNOSIS — K219 Gastro-esophageal reflux disease without esophagitis: Secondary | ICD-10-CM | POA: Diagnosis not present

## 2016-03-19 DIAGNOSIS — K222 Esophageal obstruction: Secondary | ICD-10-CM

## 2016-03-19 DIAGNOSIS — E119 Type 2 diabetes mellitus without complications: Secondary | ICD-10-CM | POA: Insufficient documentation

## 2016-03-19 DIAGNOSIS — Z794 Long term (current) use of insulin: Secondary | ICD-10-CM | POA: Diagnosis not present

## 2016-03-19 HISTORY — PX: BALLOON DILATION: SHX5330

## 2016-03-19 HISTORY — PX: ESOPHAGOGASTRODUODENOSCOPY: SHX5428

## 2016-03-19 HISTORY — DX: Type 2 diabetes mellitus without complications: E11.9

## 2016-03-19 LAB — GLUCOSE, CAPILLARY: GLUCOSE-CAPILLARY: 233 mg/dL — AB (ref 65–99)

## 2016-03-19 SURGERY — EGD (ESOPHAGOGASTRODUODENOSCOPY)
Anesthesia: Monitor Anesthesia Care

## 2016-03-19 MED ORDER — PROPOFOL 500 MG/50ML IV EMUL
INTRAVENOUS | Status: DC | PRN
  Administered 2016-03-19: 125 ug/kg/min via INTRAVENOUS

## 2016-03-19 MED ORDER — PROPOFOL 10 MG/ML IV BOLUS
INTRAVENOUS | Status: AC
  Filled 2016-03-19: qty 40

## 2016-03-19 MED ORDER — LACTATED RINGERS IV SOLN
INTRAVENOUS | Status: DC
Start: 2016-03-19 — End: 2016-03-19
  Administered 2016-03-19: 09:00:00 via INTRAVENOUS

## 2016-03-19 MED ORDER — SODIUM CHLORIDE 0.9 % IV SOLN: INTRAVENOUS | Status: DC

## 2016-03-19 MED ORDER — PROPOFOL 500 MG/50ML IV EMUL
INTRAVENOUS | Status: DC | PRN
  Administered 2016-03-19 (×2): 30 mg via INTRAVENOUS

## 2016-03-19 NOTE — Transfer of Care (Signed)
Immediate Anesthesia Transfer of Care Note  Patient: Ronald Johnson  Procedure(s) Performed: Procedure(s): ESOPHAGOGASTRODUODENOSCOPY (EGD) (N/A) BALLOON DILATION (N/A)  Patient Location: PACU  Anesthesia Type:MAC  Level of Consciousness: sedated, patient cooperative and responds to stimulation  Airway & Oxygen Therapy: Patient Spontanous Breathing and Patient connected to nasal cannula oxygen  Post-op Assessment: Report given to RN and Post -op Vital signs reviewed and stable  Post vital signs: Reviewed and stable  Last Vitals:  Vitals:   03/19/16 0805 03/19/16 0900  BP: (!) 168/88 (!) 90/34  Pulse: 72 76  Resp: 19 16  Temp: 36.6 C     Last Pain:  Vitals:   03/19/16 0805  TempSrc: Oral         Complications: No apparent anesthesia complications

## 2016-03-19 NOTE — H&P (Signed)
  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 Servando Snare.  The patient is here today with complaints of dysphagia. He says that this is been occurring from that time he had surgery. Has dysphagia to solid foods only; liquids go down just fine. He says that this occurs with about everything that he eats unless he completely purees it in mouth. It is painful and embarrassing. He had an esophagram 09/18/2015 that showed a widely patent anastomosis. He was sent here for [place] to consider dilation. He also complains of severe bile reflux since his surgery. He has had 6-8 episodes of this occurring at nighttime. He is on Nexium twice a day and has tried Carafate for his symptoms without improvement.  Current Medications, Allergies, Past Medical History, Past Surgical History, Family History and Social History were reviewed in [employer] record.   Physical Exam: BP 126/70 mmHg  Pulse 84  Ht 6\' 4"  (1.93 m)  Wt 227 lb (102.967 kg)  BMI 27.64 kg/m2 General: Well developed white male in no acute distress Head: Normocephalic and atraumatic Eyes:  Sclerae anicteric, conjunctiva pink  Ears: Normal auditory acuity Lungs: Clear throughout to auscultation Heart: Regular rate and rhythm Abdomen: Soft, non-distended.  Normal bowel sounds.  Non-tender.  Scar noted on upper abdomen. Musculoskeletal: Symmetrical with no gross deformities  Extremities: No edema  Neurological: Alert oriented x 4, grossly non-focal Psychological:  Alert and cooperative. Normal mood and affect  Assessment and Recommendations: -70 year old male status post chemotherapy, radiation, and surgery for GE junction cancer. Has been experiencing solid food dysphagia since his surgery in December 2016. He had a normal esophagram in January. I discussed with Dr. Henrene Pastor who is willing to attempt possible  balloon dilation of the anastomosis to see if this will help his symptoms.  Unfortunately for his complaints of bile reflux, this is due to changes in anatomy and there is really nothing more that we can do to try to fix that.  The risks, benefits, and alternatives to EGD with dilation were discussed with the patient and he consents to proceed. -IDDM:  Insulin will be adjusted prior to endoscopic procedure per protocol. Will resume normal dosing after procedure.   GI ATTENDING  Since this office visit patient did undergo esophageal dilation. Had transient relief with recurrence. Now for repeat dilation. No other interval issues.

## 2016-03-19 NOTE — Anesthesia Preprocedure Evaluation (Addendum)
Anesthesia Evaluation  Patient identified by MRN, date of birth, ID band Patient awake    Reviewed: Allergy & Precautions, NPO status , Patient's Chart, lab work & pertinent test results  Airway Mallampati: II  TM Distance: >3 FB Neck ROM: Full    Dental no notable dental hx. (+) Partial Lower, Partial Upper   Pulmonary former smoker,    Pulmonary exam normal breath sounds clear to auscultation       Cardiovascular negative cardio ROS Normal cardiovascular exam Rhythm:Regular Rate:Normal     Neuro/Psych negative neurological ROS  negative psych ROS   GI/Hepatic Neg liver ROS, Esophageal cancer   Endo/Other  negative endocrine ROSdiabetes  Renal/GU negative Renal ROS  negative genitourinary   Musculoskeletal negative musculoskeletal ROS (+)   Abdominal   Peds negative pediatric ROS (+)  Hematology negative hematology ROS (+)   Anesthesia Other Findings   Reproductive/Obstetrics negative OB ROS                            Anesthesia Physical Anesthesia Plan  ASA: II  Anesthesia Plan: MAC   Post-op Pain Management:    Induction:   Airway Management Planned: Simple Face Mask  Additional Equipment:   Intra-op Plan:   Post-operative Plan:   Informed Consent: I have reviewed the patients History and Physical, chart, labs and discussed the procedure including the risks, benefits and alternatives for the proposed anesthesia with the patient or authorized representative who has indicated his/her understanding and acceptance.   Dental advisory given  Plan Discussed with: CRNA  Anesthesia Plan Comments:         Anesthesia Quick Evaluation

## 2016-03-19 NOTE — Anesthesia Postprocedure Evaluation (Signed)
Anesthesia Post Note  Patient: Ronald Johnson  Procedure(s) Performed: Procedure(s) (LRB): ESOPHAGOGASTRODUODENOSCOPY (EGD) (N/A) BALLOON DILATION (N/A)  Patient location during evaluation: Endoscopy Anesthesia Type: MAC Level of consciousness: awake and alert Pain management: pain level controlled Vital Signs Assessment: post-procedure vital signs reviewed and stable Respiratory status: spontaneous breathing, nonlabored ventilation, respiratory function stable and patient connected to nasal cannula oxygen Cardiovascular status: stable and blood pressure returned to baseline Anesthetic complications: no    Last Vitals:  Vitals:   03/19/16 0805 03/19/16 0900  BP: (!) 168/88 (!) 90/34  Pulse: 72 76  Resp: 19 16  Temp: 36.6 C     Last Pain:  Vitals:   03/19/16 0805  TempSrc: Oral                 [name]

## 2016-03-19 NOTE — Op Note (Signed)
[facility] Patient Name: Ronald Johnson Procedure Date: 03/19/2016 MRN: [id number] Attending MD: Docia Chuck. Henrene Pastor , MD Date of Birth: 16-Aug-1946 CSN: [id number] Age: 70 Admit Type: Outpatient Procedure:                Upper GI endoscopy, with balloon dilation of the                            esophagus to 18 mm Indications:              Dysphagia Providers:                Docia Chuck. Henrene Pastor, MD, Cleda Daub, RN, William Dalton, Technician, Herbie Drape, CRNA Referring MD:              Medicines:                Monitored Anesthesia Care Complications:            No immediate complications. Estimated Blood Loss:     Estimated blood loss: none. Procedure:                Pre-Anesthesia Assessment:                           - Prior to the procedure, a History and Physical                            was performed, and patient medications and                            allergies were reviewed. The patient's tolerance of                            previous anesthesia was also reviewed. The risks                            and benefits of the procedure and the sedation                            options and risks were discussed with the patient.                            All questions were answered, and informed consent                            was obtained. Prior Anticoagulants: The patient has                            taken no previous anticoagulant or antiplatelet                            agents. ASA Grade Assessment: III - A patient with  severe systemic disease. After reviewing the risks                            and benefits, the patient was deemed in                            satisfactory condition to undergo the procedure.                           After obtaining informed consent, the endoscope was                            passed under direct vision. Throughout the                            procedure, the  patient's blood pressure, pulse, and                            oxygen saturations were monitored continuously. The                            Endoscope was introduced through the mouth, and                            advanced to the second part of duodenum. The upper                            GI endoscopy was accomplished without difficulty.                            The patient tolerated the procedure well. Scope In: Scope Out: Findings:      One moderate benign-appearing gastroesophageal anastomotic, intrinsic       stenosis was found 20 cm from the incisors. This measured 1.4 cm (inner       diameter) and was traversed. No active inflammation. A TTS dilator was       passed through the scope. Dilation with a 15-16.5-18 mm balloon dilator       was performed to 18 mm. The dilation site was examined following       endoscope reinsertion and showed moderate improvement in luminal       narrowing.      The stomach postoperative was normal.      The examined duodenum was normal.      The cardia and gastric fundus were normal on retroflexion, with expected       postoperative change. Impression:               - Benign-appearing anastomotic esophageal stenosis.                            Dilated.                           - Normal postoperative stomach.                           -  Normal examined duodenum.                           - No specimens collected. Moderate Sedation:      none Recommendation:           - Patient has a contact number available for                            emergencies. The signs and symptoms of potential                            delayed complications were discussed with the                            patient. Return to normal activities tomorrow.                            Written discharge instructions were provided to the                            patient.                           - Resume previous diet.                           - Continue present  medications. Procedure Code(s):        --- Professional ---                           [phone], Esophagogastroduodenoscopy, flexible,                            transoral; with transendoscopic balloon dilation of                            esophagus (less than 30 mm diameter) Diagnosis Code(s):        --- Professional ---                           K22.2, Esophageal obstruction                           R13.10, Dysphagia, unspecified CPT copyright 2016 American Medical Association. All rights reserved. The codes documented in this report are preliminary and upon coder review may  be revised to meet current compliance requirements. Docia Chuck. Henrene Pastor, MD 03/19/2016 9:00:04 AM This report has been signed electronically. Number of Addenda: 0

## 2016-03-19 NOTE — Discharge Instructions (Signed)
Esophageal Dilatation °Esophageal dilatation is a procedure to open a blocked or narrowed part of the esophagus. The esophagus is the long tube in your throat that carries food and liquid from your mouth to your stomach. The procedure is also called esophageal dilation.  °You may need this procedure if you have a buildup of scar tissue in your esophagus that makes it difficult, painful, or even impossible to swallow. This can be caused by gastroesophageal reflux disease (GERD). In rare cases, people need this procedure because they have cancer of the esophagus or a problem with the way food moves through the esophagus. Sometimes you may need to have another dilatation to enlarge the opening of the esophagus gradually. °LET YOUR HEALTH CARE PROVIDER KNOW ABOUT:  °· Any allergies you have. °· All medicines you are taking, including vitamins, herbs, eye drops, creams, and over-the-counter medicines. °· Previous problems you or members of your family have had with the use of anesthetics. °· Any blood disorders you have. °· Previous surgeries you have had. °· Medical conditions you have. °· Any antibiotic medicines you are required to take before dental procedures. °RISKS AND COMPLICATIONS °Generally, this is a safe procedure. However, problems can occur and include: °· Bleeding from a tear in the lining of the esophagus. °· A hole (perforation) in the esophagus. °BEFORE THE PROCEDURE °· Do not eat or drink anything after midnight on the night before the procedure or as directed by your health care provider. °· Ask your health care provider about changing or stopping your regular medicines. This is especially important if you are taking diabetes medicines or blood thinners. °· Plan to have someone take you home after the procedure. °PROCEDURE  °· You will be given a medicine that makes you relaxed and sleepy (sedative). °· A medicine may be sprayed or gargled to numb the back of the throat. °· Your health care provider  can use various instruments to do an esophageal dilatation. During the procedure, the instrument used will be placed in your mouth and passed down into your esophagus. Options include: °¨ Simple dilators. This instrument is carefully placed in the esophagus to stretch it. °¨ Guided wire bougies. In this method, a flexible tube (endoscope) is used to insert a wire into the esophagus. The dilator is passed over this wire to enlarge the esophagus. Then the wire is removed. °¨ Balloon dilators. An endoscope with a small balloon at the end is passed down into the esophagus. Inflating the balloon gently stretches the esophagus and opens it up. °AFTER THE PROCEDURE °· Your blood pressure, heart rate, breathing rate, and blood oxygen level will be monitored often until the medicines you were given have worn off. °· Your throat may feel slightly sore and will probably still feel numb. This will improve slowly over time. °· You will not be allowed to eat or drink until the throat numbness has resolved. °· If this is a same-day procedure, you may be allowed to go home once you have been able to drink, urinate, and sit on the edge of the bed without nausea or dizziness. °· If this is a same-day procedure, you should have a friend or family member with you for the next 24 hours after the procedure. °  °This information is not intended to replace advice given to you by your health care provider. Make sure you discuss any questions you have with your health care provider. °  °Document Released: 09/26/2005 Document Revised: 08/26/2014 Document Reviewed: 12/15/2013 °Elsevier   Interactive Patient Education ©2016 Elsevier Inc. °Esophagogastroduodenoscopy, Care After °Refer to this sheet in the next few weeks. These instructions provide you with information about caring for yourself after your procedure. Your health care provider may also give you more specific instructions. Your treatment has been planned according to current medical  practices, but problems sometimes occur. Call your health care provider if you have any problems or questions after your procedure. °WHAT TO EXPECT AFTER THE PROCEDURE °After your procedure, it is typical to feel: °· Soreness in your throat. °· Pain with swallowing. °· Sick to your stomach (nauseous). °· Bloated. °· Dizzy. °· Fatigued. °HOME CARE INSTRUCTIONS °· Do not eat or drink anything until the numbing medicine (local anesthetic) has worn off and your gag reflex has returned. You will know that the local anesthetic has worn off when you can swallow comfortably. °· Do not drive or operate machinery until directed by your health care provider. °· Take medicines only as directed by your health care provider. °SEEK MEDICAL CARE IF:  °· You cannot stop coughing. °· You are not urinating at all or less than usual. °SEEK IMMEDIATE MEDICAL CARE IF: °· You have difficulty swallowing. °· You cannot eat or drink. °· You have worsening throat or chest pain. °· You have dizziness or lightheadedness or you faint. °· You have nausea or vomiting. °· You have chills. °· You have a fever. °· You have severe abdominal pain. °· You have black, tarry, or bloody stools. °  °This information is not intended to replace advice given to you by your health care provider. Make sure you discuss any questions you have with your health care provider. °  °Document Released: 07/22/2012 Document Revised: 08/26/2014 Document Reviewed: 07/22/2012 °Elsevier Interactive Patient Education ©2016 Elsevier Inc. ° °

## 2016-03-20 ENCOUNTER — Encounter (HOSPITAL_COMMUNITY): Payer: Self-pay | Admitting: Internal Medicine

## 2016-03-27 ENCOUNTER — Ambulatory Visit (INDEPENDENT_AMBULATORY_CARE_PROVIDER_SITE_OTHER): Payer: Medicare Other | Admitting: Psychology

## 2016-03-27 DIAGNOSIS — F4320 Adjustment disorder, unspecified: Secondary | ICD-10-CM | POA: Diagnosis not present

## 2016-03-28 ENCOUNTER — Other Ambulatory Visit: Payer: Self-pay | Admitting: Endocrinology

## 2016-03-28 ENCOUNTER — Other Ambulatory Visit: Payer: Self-pay | Admitting: Oncology

## 2016-03-28 ENCOUNTER — Other Ambulatory Visit: Payer: Self-pay | Admitting: Internal Medicine

## 2016-03-28 ENCOUNTER — Other Ambulatory Visit: Payer: Self-pay | Admitting: Gastroenterology

## 2016-03-28 DIAGNOSIS — K222 Esophageal obstruction: Secondary | ICD-10-CM

## 2016-03-28 DIAGNOSIS — R1310 Dysphagia, unspecified: Secondary | ICD-10-CM

## 2016-04-04 ENCOUNTER — Other Ambulatory Visit: Payer: Self-pay | Admitting: Oncology

## 2016-04-04 DIAGNOSIS — M7541 Impingement syndrome of right shoulder: Secondary | ICD-10-CM | POA: Diagnosis not present

## 2016-04-10 ENCOUNTER — Ambulatory Visit (INDEPENDENT_AMBULATORY_CARE_PROVIDER_SITE_OTHER): Payer: Medicare Other | Admitting: Psychology

## 2016-04-10 DIAGNOSIS — F4320 Adjustment disorder, unspecified: Secondary | ICD-10-CM

## 2016-04-11 ENCOUNTER — Other Ambulatory Visit: Payer: Self-pay | Admitting: *Deleted

## 2016-04-11 NOTE — Patient Outreach (Signed)
[facility]) Care Management  04/11/2016  Ronald Johnson 1945/10/30 [id number]    RN Health Coach  Attempted #1  Follow up outreach call to patient.  Patient was unavailable. HIPPA compliance voicemail message was left with return callback number.   Plan: RN will call patient again within 14 days.    [facility] [phone]

## 2016-04-23 ENCOUNTER — Telehealth: Payer: Self-pay | Admitting: Oncology

## 2016-04-23 ENCOUNTER — Ambulatory Visit (HOSPITAL_BASED_OUTPATIENT_CLINIC_OR_DEPARTMENT_OTHER): Payer: Medicare Other | Admitting: Oncology

## 2016-04-23 ENCOUNTER — Other Ambulatory Visit (HOSPITAL_BASED_OUTPATIENT_CLINIC_OR_DEPARTMENT_OTHER): Payer: Medicare Other

## 2016-04-23 VITALS — BP 119/60 | HR 67 | Temp 97.6°F | Resp 16 | Ht 76.0 in | Wt 233.4 lb

## 2016-04-23 DIAGNOSIS — C160 Malignant neoplasm of cardia: Secondary | ICD-10-CM

## 2016-04-23 DIAGNOSIS — D696 Thrombocytopenia, unspecified: Secondary | ICD-10-CM

## 2016-04-23 DIAGNOSIS — I10 Essential (primary) hypertension: Secondary | ICD-10-CM | POA: Diagnosis not present

## 2016-04-23 DIAGNOSIS — C159 Malignant neoplasm of esophagus, unspecified: Secondary | ICD-10-CM

## 2016-04-23 LAB — BASIC METABOLIC PANEL
ANION GAP: 10 meq/L (ref 3–11)
BUN: 19.3 mg/dL (ref 7.0–26.0)
CALCIUM: 9.5 mg/dL (ref 8.4–10.4)
CO2: 24 mEq/L (ref 22–29)
CREATININE: 1.2 mg/dL (ref 0.7–1.3)
Chloride: 107 mEq/L (ref 98–109)
EGFR: 65 mL/min/{1.73_m2} — ABNORMAL LOW (ref >90)
Glucose: 177 mg/dl — ABNORMAL HIGH (ref 70–140)
Potassium: 4.7 mEq/L (ref 3.5–5.1)
Sodium: 141 mEq/L (ref 136–145)

## 2016-04-23 LAB — CBC WITH DIFFERENTIAL/PLATELET
BASO%: 0.9 % (ref 0.0–2.0)
BASOS ABS: 0.1 10*3/uL (ref 0.0–0.1)
EOS ABS: 0.3 10*3/uL (ref 0.0–0.5)
EOS%: 4.8 % (ref 0.0–7.0)
HCT: 37.2 % — ABNORMAL LOW (ref 38.4–49.9)
HGB: 12.6 g/dL — ABNORMAL LOW (ref 13.0–17.1)
LYMPH%: 32.4 % (ref 14.0–49.0)
MCH: 32.1 pg (ref 27.2–33.4)
MCHC: 33.9 g/dL (ref 32.0–36.0)
MCV: 94.9 fL (ref 79.3–98.0)
MONO#: 0.3 10*3/uL (ref 0.1–0.9)
MONO%: 6.1 % (ref 0.0–14.0)
NEUT%: 55.8 % (ref 39.0–75.0)
NEUTROS ABS: 3 10*3/uL (ref 1.5–6.5)
PLATELETS: 122 10*3/uL — AB (ref 140–400)
RBC: 3.92 10*6/uL — AB (ref 4.20–5.82)
RDW: 13.1 % (ref 11.0–14.6)
WBC: 5.4 10*3/uL (ref 4.0–10.3)
lymph#: 1.8 10*3/uL (ref 0.9–3.3)
nRBC: 0 % (ref 0–0)

## 2016-04-23 NOTE — Telephone Encounter (Signed)
Gave patient avs report and appointments for march °

## 2016-04-23 NOTE — Progress Notes (Signed)
  Ronald OFFICE PROGRESS NOTE   Diagnosis: Esophagus cancer  INTERVAL HISTORY:   Ronald Johnson returns as scheduled. He reports the depression symptoms have resolved since he started Wellbutrin. He feels well. Good appetite and energy level. He has undergone esophagus dilatation procedures by Dr. Henrene Pastor, most recently on 03/19/2016. A benign-appearing anastomotic stricture was noted at 20 cm from the incisors. Dysphagia improved following this procedure.    Objective:  Vital signs in last 24 hours:  Blood pressure 119/60, pulse 67, temperature 97.6 F (36.4 C), temperature source Oral, resp. rate 16, height 6\' 4"  (1.93 m), weight 233 lb 6.4 oz (105.9 kg), SpO2 97 %.    HEENT: Neck without mass Lymphatics: 1 cm mobile node in the right submandibular/high cervical region, no other cervical, supraclavicular, or axillary nodes Resp: Distant breath sounds, no respiratory distress Cardio: Regular rate and rhythm GI: No hepatosplenomegaly, no mass, nontender Vascular: No leg edema   Lab Results:  Lab Results  Component Value Date   WBC 5.4 04/23/2016   HGB 12.6 (L) 04/23/2016   HCT 37.2 (L) 04/23/2016   MCV 94.9 04/23/2016   PLT 122 (L) 04/23/2016   NEUTROABS 3.0 04/23/2016    Medications: I have reviewed the patient's current medications.  Assessment/Plan: 1. 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 EUS 03/16/2015 confirmed a clinical stage IIb (uT3,uN1) tumor ? Staging CTs of the chest, abdomen, and pelvis with no evidence of metastatic disease ? Initiation of radiation 04/19/2015, cycle 1 Taxol/carboplatin 04/20/2015: Radiation completed 05/29/2015; the fifth and final week of Taxol/carboplatin 05/18/2015 ? Restaging CTs 07/20/2015 revealed no evidence of metastatic disease, incidental left lower lobe pulmonary embolism ? Transhiatal total esophagectomy 07/25/2015 confirmed a ypT3,ypN2 with 4/5 positive lymph nodes,  lymphovascular invasion, perineural invasion, negative resection margins  2. Postprandial subxiphoid pain secondary to #1  3. Diabetes-elevated blood sugar readings at the [facility] and at home  4. COPD  5. Depression-Improved with Wellbutrin  6. Hypertension  7. History of adenomatous colon polyps  8. History of diabetic related neuropathy, peripheral arterial disease, and nephropathy  9. Nausea-likely related to radiation esophagitis/gastritis  10. Incidental pulmonary embolism noted on the CT 07/20/2015, treated with Xarelto  11. Admission 08/18/2015 with GI bleeding while on Xarelto, anticoagulation therapy discontinued  12. Dysphagia-improved with esophageal dilatation procedures by Dr. Henrene Pastor  13. Mild thrombocytopenia-stable, we'll check blood smear for platelet clumping  Disposition:  Ronald Johnson remains in clinical remission from esophagus cancer. He will return for an office visit in 6 months. He will see Dr. Servando Snare in the interim.  Betsy Coder, MD  04/23/2016  10:27 AM

## 2016-04-26 ENCOUNTER — Other Ambulatory Visit: Payer: Self-pay | Admitting: *Deleted

## 2016-04-26 NOTE — Patient Outreach (Signed)
[facility]) Care Management  04/26/2016  ASIF HASEK Dec 26, 1945 [id number]   RN Health Coach  Attempted #2  Follow up outreach call to patient.  Patient was unavailable. HIPPA compliance voicemail message was left with return callback number.   Plan: RN will call patient again within 14 days.    [facility] [phone]

## 2016-05-10 ENCOUNTER — Ambulatory Visit: Payer: Self-pay | Admitting: *Deleted

## 2016-05-22 ENCOUNTER — Ambulatory Visit (INDEPENDENT_AMBULATORY_CARE_PROVIDER_SITE_OTHER): Payer: Medicare Other | Admitting: Psychology

## 2016-05-22 DIAGNOSIS — F4320 Adjustment disorder, unspecified: Secondary | ICD-10-CM | POA: Diagnosis not present

## 2016-05-25 ENCOUNTER — Other Ambulatory Visit: Payer: Self-pay | Admitting: Oncology

## 2016-05-30 ENCOUNTER — Ambulatory Visit (INDEPENDENT_AMBULATORY_CARE_PROVIDER_SITE_OTHER): Payer: Medicare Other | Admitting: Endocrinology

## 2016-05-30 ENCOUNTER — Telehealth: Payer: Self-pay | Admitting: Endocrinology

## 2016-05-30 ENCOUNTER — Encounter: Payer: Self-pay | Admitting: Endocrinology

## 2016-05-30 VITALS — BP 136/72 | HR 73 | Ht 76.0 in | Wt 235.0 lb

## 2016-05-30 DIAGNOSIS — E119 Type 2 diabetes mellitus without complications: Secondary | ICD-10-CM

## 2016-05-30 DIAGNOSIS — Z23 Encounter for immunization: Secondary | ICD-10-CM | POA: Diagnosis not present

## 2016-05-30 DIAGNOSIS — Z794 Long term (current) use of insulin: Secondary | ICD-10-CM | POA: Diagnosis not present

## 2016-05-30 DIAGNOSIS — IMO0001 Reserved for inherently not codable concepts without codable children: Secondary | ICD-10-CM

## 2016-05-30 DIAGNOSIS — Z0000 Encounter for general adult medical examination without abnormal findings: Secondary | ICD-10-CM

## 2016-05-30 LAB — MICROALBUMIN / CREATININE URINE RATIO
Creatinine,U: 211.9 mg/dL
MICROALB UR: 18.3 mg/dL — AB (ref 0.0–1.9)
Microalb Creat Ratio: 8.6 mg/g (ref 0.0–30.0)

## 2016-05-30 LAB — POCT GLYCOSYLATED HEMOGLOBIN (HGB A1C): Hemoglobin A1C: 8.4

## 2016-05-30 MED ORDER — INSULIN NPH (HUMAN) (ISOPHANE) 100 UNIT/ML ~~LOC~~ SUSP: 200.0000 [IU] | SUBCUTANEOUS | 11 refills | Status: DC

## 2016-05-30 NOTE — Patient Instructions (Addendum)
Please continue the same medication for depression. Please increase the insulin to 200 units each morning.   Try puutting peroxide into your right ear, to dissolve the ear wax Please consider these measures for your health:  minimize alcohol.  Do not use tobacco products.  Have a colonoscopy at least every 10 years from age 70.  Keep firearms safely stored.  Always use seat belts.  have working smoke alarms in your home.  See an eye doctor and dentist regularly.  Never drive under the influence of alcohol or drugs (including prescription drugs).  Those with fair skin should take precautions against the sun, and should carefully examine their skin once per month, for any new or changed moles. It is critically important to prevent falling down (keep floor areas well-lit, dry, and free of loose objects.  If you have a cane, walker, or wheelchair, you should use it, even for short trips around the house.  Wear flat-soled shoes.  Also, try not to rush) check your blood sugar twice a day.  vary the time of day when you check, between before the 3 meals, and at bedtime.  also check if you have symptoms of your blood sugar being too high or too low.  please keep a record of the readings and bring it to your next appointment here (or you can bring the meter itself).  You can write it on any piece of paper.  please call us sooner if your blood sugar goes below 70, or if you have a lot of readings over 200. Please come back for a follow-up appointment in 3 months.

## 2016-05-30 NOTE — Telephone Encounter (Signed)
Refill resubmitted to [facility].

## 2016-05-30 NOTE — Progress Notes (Signed)
Subjective:    Patient ID: Ronald Johnson, male    DOB: Sep 29, 1945, 70 y.o.   MRN: [id number]  HPI Pt returns for f/u of diabetes mellitus: DM type: Insulin-requiring type 2.  Dx'ed: 1993 Complications: polyneuropathy, PAD, and nephropathy.  Therapy: insulin since 2004.  DKA: never.  Severe hypoglycemia: never.   Pancreatitis: never.   Other: he changed to qd insulin, after he did not achieve good control on multiple daily injections; he cannot afford insulin analogs.  Interval history: He takes 180 units qam.  no cbg record, but states cbg's vary from 180-270.  There is no trend throughout the day.  He denies hypoglycemia.   Pt states few days of moderately decreased hering from the left ear, but no assoc pain.  Past Medical History:  Diagnosis Date  . Anemia 08/15/2009  . Anxiety   . Arthritis   . [name]'s esophagus 2007  . COLONIC POLYPS, ADENOMATOUS 08/01/2008, 2013, 2014  . COPD (chronic obstructive pulmonary disease) (HCC)    no per pt  . Depression   . Diabetes type 2, controlled (HCC) 03/13/2007  . Diverticulitis   . ED (erectile dysfunction)   . EMPHYSEMA 11/08/2008   no per pt  . Esophageal cancer (HCC) "dx'd ~ 01/2015"  . GERD 03/13/2007  . GIB (gastrointestinal bleeding) 08/18/2015  . Hiatal hernia   . History of blood transfusion 08/18/2015   due to GIB  . HYPERCHOLESTEROLEMIA 02/03/2008  . HYPERTENSION 03/13/2007   pt denies, claims white coat syndrome  . Prostatism   . PULMONARY NODULE 11/24/2008  . Stomach cancer (HCC) "dx'd ~ 01/2015"  . TOBACCO ABUSE 11/24/2008    Past Surgical History:  Procedure Laterality Date  . BALLOON DILATION N/A 03/19/2016   Procedure: BALLOON DILATION;  Surgeon: Hilarie Fredrickson, MD;  Location: [facility] ENDOSCOPY;  Service: Endoscopy;  Laterality: N/A;  . CIRCUMCISION    . COLONOSCOPY    . COMPLETE ESOPHAGECTOMY N/A 07/25/2015   Procedure: TRANSHIATAL TOTAL ESOPHAGECTOMY COMPLETE PYLOROMYOTOMY;  Surgeon: Delight Ovens, MD;  Location[facility]  OR;  Service: Thoracic;  Laterality: N/A;  . EGD  11/19/2005  . ESOPHAGOGASTRODUODENOSCOPY N/A 03/19/2016   Procedure: ESOPHAGOGASTRODUODENOSCOPY (EGD);  Surgeon: Hilarie Fredrickson, MD;  Location: [facility];  Service: Endoscopy;  Laterality: N/A;  . EUS N/A 03/16/2015   Procedure: UPPER ENDOSCOPIC ULTRASOUND (EUS) RADIAL;  Surgeon: Rachael Fee, MD;  Location: [facility] ENDOSCOPY;  Service: Endoscopy;  Laterality: N/A;  . FOOT FRACTURE SURGERY Right 1980   right foot w/ pins and screws  . JEJUNOSTOMY N/A 07/25/2015   Procedure: FEEDING JEJUNOSTOMY;  Surgeon: Delight Ovens, MD;  Location: [facility] OR;  Service: Thoracic;  Laterality: N/A;  . POLYPECTOMY    . removal pins and screws foot  1980   right foot  . UPPER GASTROINTESTINAL ENDOSCOPY    . VASECTOMY    . VIDEO BRONCHOSCOPY N/A 07/25/2015   Procedure: VIDEO BRONCHOSCOPY;  Surgeon: Delight Ovens, MD;  Location[facility] OR;  Service: Thoracic;  Laterality: N/A;    Social History   Social History  . Marital status: Married    Spouse name: N/A  . Number of children: 2  . Years of education: N/A   Occupational History  . 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 Smoking status: Former Smoker    Packs/day: 1.00    Years: 35.00    Types: Cigarettes    Quit date: 10/31/2008  .  Smokeless tobacco: Never Used  . Alcohol use No  . Drug use: No  . Sexual activity: Not Currently   Other Topics Concern  . 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 atorvastatin (LIPITOR) 10 MG tablet Take 1 tablet by mouth  every day 90 tablet 3  . buPROPion (WELLBUTRIN XL) 150 MG 24 hr tablet Take 1 tablet (150 mg total) by mouth daily. 90 tablet 3  . diazepam  (VALIUM) 5 MG tablet Take 1 tablet (5 mg total) by mouth 2 (two) times daily as needed for anxiety. 180 tablet 1  . glucose blood (PRECISION XTRA TEST STRIPS) test strip Check blood sugar three times a day dx 250.01 270 each 3  . Insulin Syringe-Needle U-100 30G X 3/8" 1 ML MISC Use to inject insulin 3 times daily. 300 each 1  . multivitamin-iron-minerals-folic acid (CENTRUM) chewable tablet Chew 1 tablet by mouth daily.    Marland Kitchen omeprazole (PRILOSEC) 40 MG capsule Take 1 capsule by mouth  daily 30 capsule 3  . [DISCONTINUED] metoprolol succinate (TOPROL-XL) 25 MG 24 hr tablet Take 25 mg by mouth daily.      No current facility-administered medications on file prior to visit.     No Known Allergies  Family History  Problem Relation Age of Onset  . Stroke Mother   . Diabetes Mother   . Diabetes Maternal Grandmother     mother side of the family aunts, MGF  . Breast cancer Paternal Grandmother   . Colon cancer Neg Hx   . Esophageal cancer Neg Hx   . Rectal cancer Neg Hx   . Stomach cancer Neg Hx     BP 136/72   Pulse 73   Ht 6\' 4"  (1.93 m)   Wt 235 lb (106.6 kg)   SpO2 94%   BMI 28.61 kg/m   Review of Systems He has gained 2 lbs.  Depression is much better, on medication.      Objective:   Physical Exam  VITAL SIGNS:  See vs page GENERAL: no distress Pulses: dorsalis pedis intact bilat.  MSK: no deformity of the feet CV: no leg edema Skin: no ulcer on the feet, but the skin is scaly. normal color and temp on the feet. Neuro: sensation is intact to touch on the feet.   Lab Results  Component Value Date   HGBA1C 8.4 05/30/2016       Assessment & Plan:  Insulin-requiring type 2 DM: he needs increased rx Cerumen impaction, new Depression: control is improved.   Please continue the same medication for depression. Please increase the insulin to 200 units each morning.   Try puutting peroxide into your right ear, to dissolve the ear wax  Subjective:   Patient  here for Medicare annual wellness visit and management of other chronic and acute problems.     Risk factors: advanced age    Roster of Physicians Providing Medical Care to Patient:  See "snapshot"   Activities of Daily Living: In your present state of health, do you have any difficulty performing the following activities (lives with wife)?:  Preparing food and eating?: No  Bathing yourself: No  Getting dressed: No  Using the toilet:No  Moving  around from place to place: No  In the past year have you fallen or had a near fall?:No    Home Safety: Has smoke detector and wears seat belts. Firearms are safely stored. No excess sun exposure.   Diet and Exercise  Current exercise habits: pt says good Dietary issues discussed:pt reports a healthy diet   Depression Screen  Q1: Over the past two weeks, have you felt down, depressed or hopeless?no  Q2: Over the past two weeks, have you felt little interest or pleasure in doing things? no   The following portions of the patient's history were reviewed and updated as appropriate: allergies, current medications, past family history, past medical history, past social history, past surgical history and problem list.   Review of Systems  Denies hearing loss, and visual loss Objective:   Vision:  Advertising account executive, so he declines VA today Hearing: grossly normal Body mass index:  See vs page Msk: pt easily and quickly performs "get-up-and-go" from a sitting position Cognitive Impairment Assessment: cognition, memory and judgment appear normal.  remembers 1/3 at 5 minutes (? effort).  excellent recall.  can easily read and write a sentence.  alert and oriented x 3   Assessment:   Medicare wellness utd on preventive parameters.     Plan:   During the course of the visit the patient was educated and counseled about appropriate screening and preventive services including:        Fall prevention    Diabetes screening  Nutrition counseling     Vaccines / LABS Zostavax / Pneumococcal Vaccine  today  PSA  Patient Instructions (the written plan) was given to the patient.

## 2016-05-30 NOTE — Telephone Encounter (Signed)
Patient stated prescription was sent to the wrong pharmacy.  insulin NPH Human (HUMULIN N) 100 UNIT/ML injection Send to  [facility], [place] [address] [phone] (Phone) [phone] (Fax)

## 2016-05-30 NOTE — Progress Notes (Signed)
we discussed code status.  pt requests full code, but would not want to be started or maintained on artificial life-support measures if there was not a reasonable chance of recovery 

## 2016-05-31 ENCOUNTER — Other Ambulatory Visit: Payer: Self-pay | Admitting: Endocrinology

## 2016-06-06 DIAGNOSIS — H6123 Impacted cerumen, bilateral: Secondary | ICD-10-CM | POA: Diagnosis not present

## 2016-06-21 ENCOUNTER — Other Ambulatory Visit: Payer: Self-pay | Admitting: *Deleted

## 2016-06-21 NOTE — Patient Outreach (Addendum)
[facility]) Care Management  06/21/2016  Ronald Johnson 1946-04-02 [id number]    RN Health Coach  attempted #3 Follow up outreach call to patient.  Patient was unavailable. HIPPA compliance voicemail message was left with return callback number.  Plan: RN will send unsuccessful outreach letter and closure within 10 business days   [place] Management [phone]

## 2016-07-03 ENCOUNTER — Ambulatory Visit: Payer: Self-pay | Admitting: *Deleted

## 2016-07-03 ENCOUNTER — Encounter: Payer: Self-pay | Admitting: *Deleted

## 2016-07-17 DIAGNOSIS — M7541 Impingement syndrome of right shoulder: Secondary | ICD-10-CM | POA: Diagnosis not present

## 2016-07-24 ENCOUNTER — Other Ambulatory Visit: Payer: Self-pay | Admitting: Cardiothoracic Surgery

## 2016-07-24 ENCOUNTER — Ambulatory Visit
Admission: RE | Admit: 2016-07-24 | Discharge: 2016-07-24 | Disposition: A | Discharge status: Home / Self Care | Payer: Medicare Other | Source: Ambulatory Visit | Attending: Cardiothoracic Surgery | Admitting: Cardiothoracic Surgery

## 2016-07-24 ENCOUNTER — Encounter: Payer: Self-pay | Admitting: Internal Medicine

## 2016-07-24 ENCOUNTER — Ambulatory Visit (INDEPENDENT_AMBULATORY_CARE_PROVIDER_SITE_OTHER): Payer: Medicare Other | Admitting: Internal Medicine

## 2016-07-24 VITALS — BP 110/70 | HR 80 | Ht 76.0 in | Wt 229.1 lb

## 2016-07-24 DIAGNOSIS — C159 Malignant neoplasm of esophagus, unspecified: Secondary | ICD-10-CM

## 2016-07-24 DIAGNOSIS — K222 Esophageal obstruction: Secondary | ICD-10-CM | POA: Diagnosis not present

## 2016-07-24 DIAGNOSIS — R110 Nausea: Secondary | ICD-10-CM | POA: Diagnosis not present

## 2016-07-24 MED ORDER — ONDANSETRON 4 MG PO TBDP: 4.0000 mg | ORAL_TABLET | Freq: Three times a day (TID) | ORAL | 6 refills | Status: DC | PRN

## 2016-07-24 NOTE — Patient Instructions (Signed)
We have sent the following medications to your pharmacy for you to pick up at your convenience: Zofran  Continue Omeprazole twice a day  Eat smaller meals  Good diabetes control  Call us if you have further swallowing problems  Please follow up in 6 months

## 2016-07-24 NOTE — Progress Notes (Signed)
HISTORY OF PRESENT ILLNESS:  Ronald Johnson is a 70 y.o. male with multiple medical problems including insulin requiring diabetes mellitus, COPD, and esophageal cancer for which he is status post neoadjuvant radiation and chemotherapy followed by esophagectomy December 2016. He was last seen in this office June 2017 with dysphagia. See that dictation. He was set up for upper endoscopy 03/19/2016. He was found to have an anastomotic stricture which was dilated to a maximal diameter of 18 mm. He presents today with a chief complaint of nausea. He is accompanied by his wife. First, he does report resolution of his dysphagia. He is on omeprazole 40 mg twice daily without significant reflux symptoms. Does report chronic nausea after meals. Particularly large meals. Lunch and supper most problematic. No problems in the morning or at night. No significant regurgitation. Symptoms tend to last anywhere from 30 minutes to 2 hours. Again, size of meal correlates with symptom. He has not been on antiemetic. He does take diazepam 5 mg daily, and has for some time. This makes him sleepy. States that his blood pressure control has been somewhat erratic. Since his evaluation this summer, he has had no weight loss. No issues with his bowel habits. His last colonoscopy was January 2014. 3 subcentimeter polyps removed. One adenoma. Follow-up in 3 years recommended based on his prior history with Dr. Velora Johnson. This has been postponed since his esophageal cancer diagnosis. No other complaints. He does have tramadol for shoulder pain but has not used this  REVIEW OF SYSTEMS:  All non-GI ROS negative except for sinus and allergy, anxiety, confusion, cough, depression, fatigue, increased thirst, increased urination, shortness of breath  Past Medical History:  Diagnosis Date  . Anemia 08/15/2009  . Anxiety   . Arthritis   . [name]'s esophagus 2007  . COLONIC POLYPS, ADENOMATOUS 08/01/2008, 2013, 2014  . COPD (chronic  obstructive pulmonary disease) (HCC)    no per pt  . Depression   . Diabetes type 2, controlled ([place]) 03/13/2007  . Diverticulitis   . ED (erectile dysfunction)   . EMPHYSEMA 11/08/2008   no per pt  . Esophageal cancer ([place]) "dx'd ~ 01/2015"  . GERD 03/13/2007  . GIB (gastrointestinal bleeding) 08/18/2015  . Hiatal hernia   . History of blood transfusion 08/18/2015   due to GIB  . HYPERCHOLESTEROLEMIA 02/03/2008  . HYPERTENSION 03/13/2007   pt denies, claims white coat syndrome  . Prostatism   . PULMONARY NODULE 11/24/2008  . Stomach cancer ([place]) "dx'd ~ 01/2015"  . TOBACCO ABUSE 11/24/2008    Past Surgical History:  Procedure Laterality Date  . BALLOON DILATION N/A 03/19/2016   Procedure: BALLOON DILATION;  Surgeon: Ronald Shipper, MD;  [facility];  Service: Endoscopy;  Laterality: N/A;  . CIRCUMCISION    . COLONOSCOPY    . COMPLETE ESOPHAGECTOMY N/A 07/25/2015   Procedure: TRANSHIATAL TOTAL ESOPHAGECTOMY COMPLETE PYLOROMYOTOMY;  Surgeon: Ronald Isaac, MD;  Location: [place];  Service: Thoracic;  Laterality: N/A;  . EGD  11/19/2005  . ESOPHAGOGASTRODUODENOSCOPY N/A 03/19/2016   Procedure: ESOPHAGOGASTRODUODENOSCOPY (EGD);  Surgeon: Ronald Shipper, MD;  Location: [facility];  Service: Endoscopy;  Laterality: N/A;  . EUS N/A 03/16/2015   Procedure: UPPER ENDOSCOPIC ULTRASOUND (EUS) RADIAL;  Surgeon: Ronald Banister, MD;  [facility];  Service: Endoscopy;  Laterality: N/A;  . FOOT FRACTURE SURGERY Right 1980   right foot w/ pins and screws  . JEJUNOSTOMY N/A 07/25/2015   Procedure: FEEDING JEJUNOSTOMY;  Surgeon: Ronald Johnson  Ronald Snare, MD;  Location: [place];  Service: Thoracic;  Laterality: N/A;  . POLYPECTOMY    . removal pins and screws foot  1980   right foot  . UPPER GASTROINTESTINAL ENDOSCOPY    . VASECTOMY    . VIDEO BRONCHOSCOPY N/A 07/25/2015   Procedure: VIDEO BRONCHOSCOPY;  Surgeon: Ronald Isaac, MD;  [facility];  Service: Thoracic;  Laterality: N/A;     Social History Ronald Johnson  reports that he quit smoking about 7 years ago. His smoking use included Cigarettes. He has a 35.00 pack-year smoking history. He has never used smokeless tobacco. He reports that he does not drink alcohol or use drugs.  family history includes Breast cancer in his paternal grandmother; Diabetes in his maternal grandmother and mother; Stroke in his mother.  No Known Allergies     PHYSICAL EXAMINATION: Vital signs: BP 110/70   Pulse 80   Ht 6\' 4"  (1.93 m)   Wt 229 lb 2 oz (103.9 kg)   BMI 27.89 kg/m   Constitutional: generally well-appearing, no acute distress Psychiatric: alert and oriented x3, cooperative Eyes: extraocular movements intact, anicteric, conjunctiva pink Mouth: oral pharynx moist, no lesions. No thrush Neck: supple without thyromegaly. no lymphadenopathy Cardiovascular: heart regular rate and rhythm, no murmur Lungs: clear to auscultation bilaterally Abdomen: soft, nontender, nondistended, no obvious ascites, no peritoneal signs, normal bowel sounds, no organomegaly Rectal: Ommitted Extremities: no cyanosis or lower extremity edema bilaterally Skin: no lesions on visible extremities Neuro: No focal deficits. Cranial nerves intact  ASSESSMENT:  #1. Chronic postprandial nausea. This is the result of altered anatomy and vagus nerve sacrifice #2. Iatrogenic GERD. Seemingly controlled with PPI #3. Anastomotic stricture. Symptomatic dysphagia improved post dilation 03/19/2016 #4. Esophageal cancer as described #5. History of adenomatous colon polyps  PLAN:  #1. Encouraged to continue with more frequent small meals as this alleviates significant nausea. He has been able to maintain his weight #2. Prescribe Zofran 4 mg. Instructed to take 3 times daily as needed. Would use running dose initially #3. Continue twice a day PPI #4. Reflux precautions #5. Good diabetes control stressed #6. Routine GI office follow-up 6 months.  Contact the office in the interim for any questions or problems #7. Reconsider surveillance colonoscopy in the future. On hold for now. He understands. 25 minutes spent face-to-face with the patient. Greater than 50% a time use for counseling regarding his chronic nausea, anastomotic stricture, and colon cancer surveillance

## 2016-07-25 ENCOUNTER — Ambulatory Visit (INDEPENDENT_AMBULATORY_CARE_PROVIDER_SITE_OTHER): Payer: Medicare Other | Admitting: Cardiothoracic Surgery

## 2016-07-25 ENCOUNTER — Encounter: Payer: Self-pay | Admitting: Cardiothoracic Surgery

## 2016-07-25 VITALS — BP 112/73 | HR 83 | Resp 18 | Ht 76.0 in | Wt 229.0 lb

## 2016-07-25 DIAGNOSIS — Z9889 Other specified postprocedural states: Secondary | ICD-10-CM

## 2016-07-25 DIAGNOSIS — Z9049 Acquired absence of other specified parts of digestive tract: Secondary | ICD-10-CM | POA: Diagnosis not present

## 2016-07-25 DIAGNOSIS — C155 Malignant neoplasm of lower third of esophagus: Secondary | ICD-10-CM | POA: Diagnosis not present

## 2016-07-25 NOTE — Progress Notes (Signed)
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 SURGICAL PROCEDURE: Bronchoscopy, transhiatal total esophagectomy with cervical esophagogastrostomy, pyloromyotomy, and placement of jejunal feeding tube. SURGEON: Lanelle Bal, MD.  GE junction carcinoma [facility])   Staging form: Stomach, AJCC 7th Edition     Clinical: Stage IIB (T3, N1, M0) - Signed by Ladell Pier, MD on 03/28/2015   Staging form: Esophagus - Adenocarcinoma, AJCC 7th Edition     Pathologic stage from 07/26/2015: Stage IIIB (yT3, N2, cM0, G3 - Poorly differentiated) - Signed by [name], MD on 07/28/2015  History of Present Illness:     Patient returns to the office today after transhiatal esophagectomy cervical esophagogastrostomy pyloromyotomy and placement of  jejunal feeding tube.  Currently he is taking a by mouth diet well. He is getting 5 or 6 pounds since last seen. He has had several episodes of bile reflux and will come up at night, he also notes that at times certain foods "stick in his throat". He has seen gastroenterology and has endoscopy and possible dilatation of the anastomosis scheduled for next week. In 09/18/2015 a Gastrografin swallow showed no evidence of stricture  The patient does complain of symptoms of bile reflux special he at night, with bitter taste in his mouth.  The patient has returned to local gym and started doing increasing exercise, which he has tolerated without difficulty  He was seen at [facility] oncology for second opinion about  postop treatment and also for consideration of clinical trials. Patient noted that he decided not to proceed with any clinical trial.   Past Medical History:  Diagnosis Date  . Anemia 08/15/2009  . Anxiety   . Arthritis   . [name]'s esophagus 2007  . COLONIC POLYPS, ADENOMATOUS 08/01/2008, 2013, 2014  . COPD (chronic obstructive pulmonary disease) (HCC)    no per pt  . Depression   . Diabetes type 2, controlled ([place]) 03/13/2007  . Diverticulitis   . ED (erectile dysfunction)   . EMPHYSEMA 11/08/2008   no per pt  . Esophageal cancer ([place]) "dx'd ~ 01/2015"  . GERD 03/13/2007  . GIB (gastrointestinal bleeding) 08/18/2015  . Hiatal hernia   . History of blood transfusion 08/18/2015   due to GIB  . HYPERCHOLESTEROLEMIA 02/03/2008  . HYPERTENSION 03/13/2007   pt denies, claims white coat syndrome  . Prostatism   . PULMONARY NODULE 11/24/2008  . Stomach cancer ([place]) "dx'd ~ 01/2015"  . TOBACCO ABUSE 11/24/2008     History  Smoking Status  . Former Smoker  . Packs/day: 1.00  . Years: 35.00  . Types: Cigarettes  . Quit date: 10/31/2008  Smokeless Tobacco  . Never Used    History  Alcohol Use No     No Known Allergies  Current Outpatient Prescriptions  Medication Sig Dispense Refill  . atorvastatin (LIPITOR) 10 MG tablet Take 1 tablet by mouth  every day 90 tablet 3  . diazepam (VALIUM) 5 MG tablet Take 1 tablet (5 mg total) by mouth 2 (two) times daily  as needed for anxiety. 180 tablet 1  . glucose blood (PRECISION XTRA TEST STRIPS) test strip Check blood sugar three times a day dx 250.01 270 each 3  . insulin NPH Human (HUMULIN N) 100 UNIT/ML injection Inject 2 mLs (200 Units total) into the skin every morning. 7 vial 11  . Insulin Syringe-Needle U-100 30G X 3/8" 1 ML MISC Use to inject insulin 3 times daily. 300 each 1  . omeprazole (PRILOSEC) 40 MG capsule Take 1 capsule by mouth  daily (Patient taking differently: Take 1 capsule by mouth  twice a day) 30 capsule 3  .  ondansetron (ZOFRAN ODT) 4 MG disintegrating tablet Take 1 tablet (4 mg total) by mouth 3 (three) times daily as needed for nausea or vomiting. 90 tablet 6  . sildenafil (REVATIO) 20 MG tablet TAKE 1-5 TABLETS BY MOUTH ONCE DAILY AS NEEDED 50 tablet 0  . traMADol (ULTRAM) 50 MG tablet as needed.     No current facility-administered medications for this visit.     Wt Readings from Last 3 Encounters:  07/25/16 229 lb (103.9 kg)  07/24/16 229 lb 2 oz (103.9 kg)  05/30/16 235 lb (106.6 kg)   Filed Weights   07/25/16 1017  Weight: 229 lb (103.9 kg)    Physical Exam: BP 112/73 (BP Location: Left Arm, Patient Position: Sitting, Cuff Size: Large)   Pulse 83   Resp 18   Ht 6\' 4"  (1.93 m)   Wt 229 lb (103.9 kg)   SpO2 94% Comment: ON RA  BMI 27.87 kg/m   General appearance: alert and cooperative Neurologic: intact Heart: regular rate and rhythm, S1, S2 normal, no murmur, click, rub or gallop Lungs: clear to auscultation bilaterally Abdomen: soft, non-tender; bowel sounds normal; no masses,  no organomegaly Extremities: extremities normal, atraumatic, no cyanosis or edema and Homans sign is negative, no sign of DVT Wound: Neck incision is well-healed abdominal incision is well-healed Patient has no cervical or supraclavicular adenopathy has no axillary adenopathy   Diagnostic Studies & Laboratory data:     Recent Radiology Findings:    Dg Chest 2 View  Result Date: 07/24/2016 CLINICAL DATA:  Esophageal cancer. EXAM: CHEST  2 VIEW COMPARISON:  01/25/2016 FINDINGS: Lower mediastinal widening correlating with gastric pull-through. Mild chronic interstitial coarsening. There is no edema, consolidation, effusion, or pneumothorax. Normal heart size and aortic contours. Spondylosis. IMPRESSION: Stable appearance of the postoperative chest. Electronically Signed   By: Monte Fantasia M.D.   On: 07/24/2016 10:38    I have independently reviewed the above radiology studies  and reviewed the  findings with the patient.   Recent Lab Findings: Lab Results  Component Value Date   WBC 5.4 04/23/2016   HGB 12.6 (L) 04/23/2016   HCT 37.2 (L) 04/23/2016   PLT 122 (L) 04/23/2016   GLUCOSE 177 (H) 04/23/2016   CHOL 170 12/02/2014   TRIG (H) 12/02/2014    449.0 Triglyceride is over 400; calculations on Lipids are invalid.   HDL 32.40 (L) 12/02/2014   LDLDIRECT 93.0 12/02/2014   LDLCALC 94 06/17/2014   ALT 15 (L) 08/18/2015   AST 19 08/18/2015   NA 141 04/23/2016   K 4.7 04/23/2016   CL 108 02/29/2016   CREATININE 1.2 04/23/2016   BUN 19.3 04/23/2016   CO2 24 04/23/2016   TSH 2.73 08/31/2015   INR 1.06 08/19/2015   HGBA1C 8.4 05/30/2016    Wt Readings from Last 3 Encounters:  07/25/16 229 lb (103.9 kg)  07/24/16 229 lb 2 oz (103.9 kg)  05/30/16 235 lb (106.6 kg)     Assessment / Plan:   Status post transhiatal esophagectomy pathologic stage IIIB, Currently without evidence of recurrence chest x-ray is clear of any pleural effusions today Continue to monitor the patient's weight, he denies any difficulty with swallowing after dilatation of anastomosis 23-4 months ago He has an appointment with oncology in March I plan to see him back in 93 months  [name] MD      [address] [place] [address] Office [phone]   Beeper [phone]  07/25/2016 10:26 AM

## 2016-08-14 DIAGNOSIS — M7541 Impingement syndrome of right shoulder: Secondary | ICD-10-CM | POA: Diagnosis not present

## 2016-08-19 ENCOUNTER — Other Ambulatory Visit: Payer: Self-pay | Admitting: Nurse Practitioner

## 2016-08-25 NOTE — Progress Notes (Signed)
Subjective:    Patient ID: Ronald Johnson, male    DOB: March 06, 1946, 71 y.o.   MRN: [id number]  HPI Pt returns for f/u of diabetes mellitus: DM type: Insulin-requiring type 2.  Dx'ed: 2947 Complications: polyneuropathy, PAD, and nephropathy.  Therapy: insulin since 2004.  DKA: never.  Severe hypoglycemia: never.   Pancreatitis: never.   Other: he changed to qd insulin, after he did not achieve good control on multiple daily injections; he cannot afford insulin analogs; insulin requirement has decreased since cancer dx.  Interval history: He takes 200 units qam.  He has a meter and strips, but he does not check cbg's.  He has lightheadedness after breakfast, and thinks he might be going low then.    1 month ago, he got a steroid shot into the right shoulder for bursitis.  Past Medical History:  Diagnosis Date  . Anemia 08/15/2009  . Anxiety   . Arthritis   . [name]'s esophagus 2007  . COLONIC POLYPS, ADENOMATOUS 08/01/2008, 2013, 2014  . COPD (chronic obstructive pulmonary disease) (HCC)    no per pt  . Depression   . Diabetes type 2, controlled ([place]) 03/13/2007  . Diverticulitis   . ED (erectile dysfunction)   . EMPHYSEMA 11/08/2008   no per pt  . Esophageal cancer ([place]) "dx'd ~ 01/2015"  . GERD 03/13/2007  . GIB (gastrointestinal bleeding) 08/18/2015  . Hiatal hernia   . History of blood transfusion 08/18/2015   due to GIB  . HYPERCHOLESTEROLEMIA 02/03/2008  . HYPERTENSION 03/13/2007   pt denies, claims white coat syndrome  . Prostatism   . PULMONARY NODULE 11/24/2008  . Stomach cancer ([place]) "dx'd ~ 01/2015"  . TOBACCO ABUSE 11/24/2008    Past Surgical History:  Procedure Laterality Date  . BALLOON DILATION N/A 03/19/2016   Procedure: BALLOON DILATION;  Surgeon: Irene Shipper, MD;  [facility];  Service: Endoscopy;  Laterality: N/A;  . CIRCUMCISION    . COLONOSCOPY    . COMPLETE ESOPHAGECTOMY N/A 07/25/2015   Procedure: TRANSHIATAL TOTAL ESOPHAGECTOMY COMPLETE  PYLOROMYOTOMY;  Surgeon: [name], MD;  Location: [facility];  Service: Thoracic;  Laterality: N/A;  . EGD  11/19/2005  . ESOPHAGOGASTRODUODENOSCOPY N/A 03/19/2016   Procedure: ESOPHAGOGASTRODUODENOSCOPY (EGD);  Surgeon: Irene Shipper, MD;  [facility];  Service: Endoscopy;  Laterality: N/A;  . EUS N/A 03/16/2015   Procedure: UPPER ENDOSCOPIC ULTRASOUND (EUS) RADIAL;  Surgeon: Milus Banister, MD;  [facility];  Service: Endoscopy;  Laterality: N/A;  . FOOT FRACTURE SURGERY Right 1980   right foot w/ pins and screws  . JEJUNOSTOMY N/A 07/25/2015   Procedure: FEEDING JEJUNOSTOMY;  Surgeon: [name], MD;  Location: [facility];  Service: Thoracic;  Laterality: N/A;  . POLYPECTOMY    . removal pins and screws foot  1980   right foot  . UPPER GASTROINTESTINAL ENDOSCOPY    . VASECTOMY    . VIDEO BRONCHOSCOPY N/A 07/25/2015   Procedure: VIDEO BRONCHOSCOPY;  Surgeon: [name], MD;  Location: [facility] OR;  Service: Thoracic;  Laterality: N/A;    Social History   Social History  . Marital status: Married    Spouse name: N/A  . Number of children: 2  . Years of education: N/A   Occupational History  . 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 Smoking status: Former Smoker    Packs/day: 1.00    Years: 35.00  Types: Cigarettes    Quit date: 10/31/2008  . Smokeless tobacco: Never Used  . Alcohol use No  . Drug use: No  . Sexual activity: Not Currently   Other Topics Concern  . 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 atorvastatin (LIPITOR) 10 MG tablet Take 1 tablet by mouth  every day 90 tablet 3  . diazepam (VALIUM) 5 MG tablet Take 1 tablet (5 mg total) by  mouth 2 (two) times daily as needed for anxiety. 180 tablet 1  . glucose blood (PRECISION XTRA TEST STRIPS) test strip Check blood sugar three times a day dx 250.01 270 each 3  . Insulin Syringe-Needle U-100 30G X 3/8" 1 ML MISC Use to inject insulin 3 times daily. 300 each 1  . omeprazole (PRILOSEC) 40 MG capsule Take 1 capsule by mouth  daily (Patient taking differently: Take 1 capsule by mouth  twice a day) 30 capsule 3  . ondansetron (ZOFRAN ODT) 4 MG disintegrating tablet Take 1 tablet (4 mg total) by mouth 3 (three) times daily as needed for nausea or vomiting. 90 tablet 6  . sildenafil (REVATIO) 20 MG tablet TAKE 1-5 TABLETS BY MOUTH ONCE DAILY AS NEEDED 50 tablet 0  . traMADol (ULTRAM) 50 MG tablet as needed.    . [DISCONTINUED] metoprolol succinate (TOPROL-XL) 25 MG 24 hr tablet Take 25 mg by mouth daily.      No current facility-administered medications on file prior to visit.     No Known Allergies  Family History  Problem Relation Age of Onset  . Stroke Mother   . Diabetes Mother   . Diabetes Maternal Grandmother     mother side of the family aunts, MGF  . Breast cancer Paternal Grandmother   . Colon cancer Neg Hx   . Esophageal cancer Neg Hx   . Rectal cancer Neg Hx   . Stomach cancer Neg Hx     BP 122/62   Pulse (!) 112   Ht '6\' 4"'$  (1.93 m)   Wt 232 lb (105.2 kg)   SpO2 94%   BMI 28.24 kg/m   Review of Systems He has lost 3 lbs since last ov.  Denies LOC.     Objective:   Physical Exam VITAL SIGNS:  See vs page GENERAL: no distress Pulses: dorsalis pedis intact bilat.  MSK: no deformity of the feet CV: no leg edema Skin: no ulcer on the feet, but the skin is scaly. normal color and temp on the feet. Neuro: sensation is intact to touch on the feet.   A1c=10.6%     Assessment & Plan:  Insulin-requiring type 2 DM: worse Bursitis: steroid shot is affecting a1c. Weight loss: possibly due to glycosuria.  Lightheadedness: uncertain etiology.  He needs  to check cbg to tell if it is hypoglycemia.  Patient is advised the following: Patient Instructions  Please increase the insulin to 240 units each morning. Drink plenty of fluids.  This also helps your blood sugar.   check your blood sugar twice a day.  vary the time of day when you check, between before the 3 meals, and at bedtime.  also check if you have symptoms of your blood sugar being too  high or too low.  please keep a record of the readings and bring it to your next appointment here (or you can bring the meter itself).  You can write it on any piece of paper.  please call us sooner if your blood sugar goes below 70, or if you have a lot of readings over 200.   Please come back for a follow-up appointment in 2 months.

## 2016-08-26 ENCOUNTER — Other Ambulatory Visit: Payer: Self-pay | Admitting: Endocrinology

## 2016-08-26 ENCOUNTER — Other Ambulatory Visit: Payer: Self-pay

## 2016-08-26 MED ORDER — DIAZEPAM 5 MG PO TABS: 5.0000 mg | ORAL_TABLET | Freq: Two times a day (BID) | ORAL | 1 refills | Status: DC | PRN

## 2016-08-27 ENCOUNTER — Other Ambulatory Visit: Payer: Self-pay | Admitting: Endocrinology

## 2016-08-30 ENCOUNTER — Encounter: Payer: Self-pay | Admitting: Endocrinology

## 2016-08-30 ENCOUNTER — Ambulatory Visit (INDEPENDENT_AMBULATORY_CARE_PROVIDER_SITE_OTHER): Payer: Medicare Other | Admitting: Endocrinology

## 2016-08-30 VITALS — BP 122/62 | HR 112 | Ht 76.0 in | Wt 232.0 lb

## 2016-08-30 DIAGNOSIS — E1151 Type 2 diabetes mellitus with diabetic peripheral angiopathy without gangrene: Secondary | ICD-10-CM

## 2016-08-30 DIAGNOSIS — Z794 Long term (current) use of insulin: Secondary | ICD-10-CM

## 2016-08-30 LAB — POCT GLYCOSYLATED HEMOGLOBIN (HGB A1C): Hemoglobin A1C: 10.6

## 2016-08-30 MED ORDER — INSULIN NPH (HUMAN) (ISOPHANE) 100 UNIT/ML ~~LOC~~ SUSP: 200.0000 [IU] | SUBCUTANEOUS | 11 refills | Status: DC

## 2016-08-30 MED ORDER — INSULIN NPH (HUMAN) (ISOPHANE) 100 UNIT/ML ~~LOC~~ SUSP: 240.0000 [IU] | SUBCUTANEOUS | 11 refills | Status: DC

## 2016-08-30 NOTE — Patient Instructions (Addendum)
Please increase the insulin to 240 units each morning. Drink plenty of fluids.  This also helps your blood sugar.   check your blood sugar twice a day.  vary the time of day when you check, between before the 3 meals, and at bedtime.  also check if you have symptoms of your blood sugar being too high or too low.  please keep a record of the readings and bring it to your next appointment here (or you can bring the meter itself).  You can write it on any piece of paper.  please call us sooner if your blood sugar goes below 70, or if you have a lot of readings over 200.   Please come back for a follow-up appointment in 2 months.

## 2016-09-02 ENCOUNTER — Encounter: Payer: Self-pay | Admitting: Sports Medicine

## 2016-09-02 ENCOUNTER — Ambulatory Visit (INDEPENDENT_AMBULATORY_CARE_PROVIDER_SITE_OTHER): Payer: Medicare Other | Admitting: Sports Medicine

## 2016-09-02 VITALS — BP 116/78 | HR 94 | Temp 97.8°F | Ht 76.0 in | Wt 232.5 lb

## 2016-09-02 DIAGNOSIS — E1151 Type 2 diabetes mellitus with diabetic peripheral angiopathy without gangrene: Secondary | ICD-10-CM | POA: Diagnosis not present

## 2016-09-02 DIAGNOSIS — Z794 Long term (current) use of insulin: Secondary | ICD-10-CM | POA: Diagnosis not present

## 2016-09-02 DIAGNOSIS — E11618 Type 2 diabetes mellitus with other diabetic arthropathy: Secondary | ICD-10-CM

## 2016-09-02 DIAGNOSIS — M7501 Adhesive capsulitis of right shoulder: Secondary | ICD-10-CM | POA: Diagnosis not present

## 2016-09-02 NOTE — Progress Notes (Signed)
Pre visit review using our clinic review tool, if applicable. No additional management support is needed unless otherwise documented below in the visit note. 

## 2016-09-02 NOTE — Progress Notes (Signed)
Ronald Johnson - 71 y.o. male MRN [id number]  Date of birth: 1946-02-05  Office Visit Note: Visit Date: 09/02/2016 PCP: [name], MD Referred by: [name], MD  Subjective: Chief Complaint  Patient presents with  . Bursitis    right shoulder, x 2 months, he has tried Ibuprofen and cortisone inj (1 month ago).    HPI: Patient underwent subacromial injection 2 months ago with mild to moderate improvement in his symptoms.  He is continued to have significant stiffness and pain with use of his right arm.  This does not keep him awake at night.  He does have poorly controlled diabetes that was significantly worsened after last subacromial injection.  Sugars went into the 400s.  He has not been performing any therapeutic exercises. ROS: Reports a sugars are now down into the low 200s.  Denies any symptoms that radiate down past his elbow.  Pain is worse with overhead reaching.. Otherwise per HPI.   Clinical History: No specialty comments available.  He reports that he quit smoking about 7 years ago. His smoking use included Cigarettes. He has a 35.00 pack-year smoking history. He has never used smokeless tobacco.   Recent Labs  02/29/16 0923 05/30/16 1037 08/30/16 0912  HGBA1C 9.9 8.4 10.6    Assessment & Plan: Visit Diagnoses: No diagnosis found.  Plan: Per problem based charting Follow-up: No Follow-up on file.  Meds: No orders of the defined types were placed in this encounter.  Procedures: No notes on file   Objective:  VS:  HT:6\' 4"  (193 cm)   WT:232 lb 8 oz (105.5 kg)  BMI:28.4    BP:116/78  HR:94bpm  TEMP:97.8 F (36.6 C)(Oral)  RESP:97 % Physical Exam:  Elderly male.  Alert and appropriate. Right shoulder: Overall well aligned.  He has marked limitation in his range of motion.  At 30 of abduction he only has 20 of external rotation and 15 of internal rotation.  Compared to the left which is normal.  Rotator cuff strength does appear to be intact although  he does have pain with supraspinatus testing.  Worst pain with Yergason's testing.  Squeeze test is minimally painful.  Internal rotation and external rotation strength is 5 out of 5. Imaging: No results found.  Past Medical/Family/Surgical/Social History: Medications & Allergies reviewed per EMR Patient Active Problem List   Diagnosis Date Noted  . Esophageal stricture   . Dysphagia 01/23/2016  . IDDM (insulin dependent diabetes mellitus) ([place]) 01/23/2016  . Controlled type 2 diabetes mellitus with diabetic peripheral angiopathy without gangrene, with long-term current use of insulin ([place]) 10/30/2015  . Fever 09/01/2015  . Diabetes mellitus due to underlying condition, uncontrolled, with circulatory complication ([place])   . Chronic diastolic heart failure ([place])   . Anemia requiring transfusions   . Hx pulmonary embolism   . Elevated troponin   . GI bleed 08/18/2015  . Symptomatic anemia 08/18/2015  . Pulmonary embolism ([place]) 08/18/2015  . HLD (hyperlipidemia) 08/18/2015  . GIB (gastrointestinal bleeding) 08/18/2015  . Gastrointestinal hemorrhage with melena   . Esophageal cancer ([place]) 07/25/2015  . LUQ abdominal pain 07/07/2015  . 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 Chest pain 12/02/2014  . Quit smoking within past year 11/04/2013  . Screening for prostate cancer 11/04/2013  . Encounter for long-term (current) use of other medications 11/04/2013  . Routine general medical examination at a health care facility 11/08/2012  . Gastroenteritis 09/10/2011  . PROTEINURIA, MILD 08/15/2009  . ANXIETY STATE, UNSPECIFIED 12/15/2008  .  PULMONARY NODULE 11/24/2008  . EMPHYSEMA 11/08/2008  . COLONIC POLYPS, ADENOMATOUS 08/01/2008  . UNSPECIFIED INFLAMMATORY AND TOXIC NEUROPATHY 05/19/2008  . CHEST PAIN, PLEURITIC 05/19/2008  . HYPERCHOLESTEROLEMIA 02/03/2008  . Depression with anxiety 03/13/2007  . Essential hypertension 03/13/2007  . GERD 03/13/2007   Past Medical History:    Diagnosis Date  . Anemia 08/15/2009  . Anxiety   . Arthritis   . [name]'s esophagus 2007  . COLONIC POLYPS, ADENOMATOUS 08/01/2008, 2013, 2014  . COPD (chronic obstructive pulmonary disease) (HCC)    no per pt  . Depression   . Diabetes type 2, controlled ([place]) 03/13/2007  . Diverticulitis   . ED (erectile dysfunction)   . EMPHYSEMA 11/08/2008   no per pt  . Esophageal cancer ([place]) "dx'd ~ 01/2015"  . GERD 03/13/2007  . GIB (gastrointestinal bleeding) 08/18/2015  . Hiatal hernia   . History of blood transfusion 08/18/2015   due to GIB  . HYPERCHOLESTEROLEMIA 02/03/2008  . HYPERTENSION 03/13/2007   pt denies, claims white coat syndrome  . Prostatism   . PULMONARY NODULE 11/24/2008  . Stomach cancer ([place]) "dx'd ~ 01/2015"  . TOBACCO ABUSE 11/24/2008   Family History  Problem Relation Age of Onset  . Stroke Mother   . Diabetes Mother   . Diabetes Maternal Grandmother     mother side of the family aunts, MGF  . Breast cancer Paternal Grandmother   . Colon cancer Neg Hx   . Esophageal cancer Neg Hx   . Rectal cancer Neg Hx   . Stomach cancer Neg Hx    Past Surgical History:  Procedure Laterality Date  . BALLOON DILATION N/A 03/19/2016   Procedure: BALLOON DILATION;  Surgeon: Irene Shipper, MD;  [facility];  Service: Endoscopy;  Laterality: N/A;  . CIRCUMCISION    . COLONOSCOPY    . COMPLETE ESOPHAGECTOMY N/A 07/25/2015   Procedure: TRANSHIATAL TOTAL ESOPHAGECTOMY COMPLETE PYLOROMYOTOMY;  Surgeon: [name], MD;  Location: [place];  Service: Thoracic;  Laterality: N/A;  . EGD  11/19/2005  . ESOPHAGOGASTRODUODENOSCOPY N/A 03/19/2016   Procedure: ESOPHAGOGASTRODUODENOSCOPY (EGD);  Surgeon: Irene Shipper, MD;  [facility];  Service: Endoscopy;  Laterality: N/A;  . EUS N/A 03/16/2015   Procedure: UPPER ENDOSCOPIC ULTRASOUND (EUS) RADIAL;  Surgeon: Milus Banister, MD;  [facility];  Service: Endoscopy;  Laterality: N/A;  . FOOT FRACTURE SURGERY Right  1980   right foot w/ pins and screws  . JEJUNOSTOMY N/A 07/25/2015   Procedure: FEEDING JEJUNOSTOMY;  Surgeon: [name], MD;  Location: [place];  Service: Thoracic;  Laterality: N/A;  . POLYPECTOMY    . removal pins and screws foot  1980   right foot  . UPPER GASTROINTESTINAL ENDOSCOPY    . VASECTOMY    . VIDEO BRONCHOSCOPY N/A 07/25/2015   Procedure: VIDEO BRONCHOSCOPY;  Surgeon: [name], MD;  Location[facility];  Service: Thoracic;  Laterality: N/A;   Social History   Occupational History  . 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 Smoking status: Former Smoker    Packs/day: 1.00    Years: 35.00    Types: Cigarettes    Quit date: 10/31/2008  . Smokeless tobacco: Never Used  . Alcohol use No  . Drug use: No  . Sexual activity: Not Currently

## 2016-09-02 NOTE — Assessment & Plan Note (Signed)
Patient with classic symptoms of adhesive capsulitis. Long discussion today regarding management options.  Given significant worsening of sugars with last subacromial injection serial intra-articular injections will be deferred but this was discussed as an option.  Referral to physical therapy placed as this is the mainstay of treatment.  Follow-up in 8 weeks or sooner if any worsening symptom

## 2016-09-02 NOTE — Patient Instructions (Signed)
Adhesive Capsulitis Introduction Adhesive capsulitis is inflammation of the tendons and ligaments that surround the shoulder joint (shoulder capsule). This condition causes the shoulder to become stiff and painful to move. Adhesive capsulitis is also called frozen shoulder. What are the causes? This condition may be caused by:  An injury to the shoulder joint.  Straining the shoulder.  Not moving the shoulder for a period of time. This can happen if your arm was injured or in a sling.  Long-standing health problems, such as:  Diabetes.  Thyroid problems.  Heart disease.  Stroke.  Rheumatoid arthritis.  Lung disease. In some cases, the cause may not be known. What increases the risk? This condition is more likely to develop in:  Women.  People who are older than 69 years of age. What are the signs or symptoms? Symptoms of this condition include:  Pain in the shoulder when moving the arm. There may also be pain when parts of the shoulder are touched. The pain is worse at night or when at rest.  Soreness or aching in the shoulder.  Inability to move the shoulder normally.  Muscle spasms. How is this diagnosed? This condition is diagnosed with a physical exam and imaging tests, such as an X-ray or MRI. How is this treated? This condition may be treated with:  Treatment of the underlying cause or condition.  Physical therapy. This involves performing exercises to get the shoulder moving again.  Medicine. Medicine may be given to relieve pain, inflammation, or muscle spasms.  Steroid injections into the shoulder joint.  Shoulder manipulation. This is a procedure to move the shoulder into another position. It is done after you are given a medicine to make you fall asleep (general anesthetic). The joint may also be injected with salt water at high pressure to break down scarring.  Surgery. This may be done in severe cases when other treatments have failed. Although  most people recover completely from adhesive capsulitis, some may not regain the full movement of the shoulder. Follow these instructions at home:  Take over-the-counter and prescription medicines only as told by your health care provider.  If you are being treated with physical therapy, follow instructions from your physical therapist.  Avoid exercises that put a lot of demand on your shoulder, such as throwing. These exercises can make pain worse.  If directed, apply ice to the injured area:  Put ice in a plastic bag.  Place a towel between your skin and the bag.  Leave the ice on for 20 minutes, 2-3 times per day. Contact a health care provider if:  You develop new symptoms.  Your symptoms get worse. This information is not intended to replace advice given to you by your health care provider. Make sure you discuss any questions you have with your health care provider. Document Released: 06/02/2009 Document Revised: 01/11/2016 Document Reviewed: 11/28/2014  2017 Elsevier

## 2016-09-10 ENCOUNTER — Ambulatory Visit: Payer: Medicare Other | Attending: Sports Medicine | Admitting: Physical Therapy

## 2016-09-10 DIAGNOSIS — M25611 Stiffness of right shoulder, not elsewhere classified: Secondary | ICD-10-CM | POA: Insufficient documentation

## 2016-09-10 DIAGNOSIS — M6281 Muscle weakness (generalized): Secondary | ICD-10-CM | POA: Diagnosis not present

## 2016-09-10 DIAGNOSIS — M25511 Pain in right shoulder: Secondary | ICD-10-CM | POA: Insufficient documentation

## 2016-09-10 DIAGNOSIS — G8929 Other chronic pain: Secondary | ICD-10-CM

## 2016-09-10 NOTE — Therapy (Signed)
[place], [place], [address] Phone: [phone]   Fax:  [phone]  Physical Therapy Evaluation  Patient Details  Name: Ronald Johnson MRN: [id number] Date of Birth: [birth year] Referring Provider: Teresa Coombs, MD  Encounter Date: 09/10/2016      PT End of Session - 09/10/16 0934    Visit Number 1   Number of Visits 16   Date for PT Re-Evaluation 11/08/16   Authorization Type KX modifier after 15 visits   PT Start Time 0930   PT Stop Time 1015   PT Time Calculation (min) 45 min   Activity Tolerance Patient tolerated treatment well   Behavior During Therapy [facility] for tasks assessed/performed      Past Medical History:  Diagnosis Date  . Anemia 08/15/2009  . Anxiety   . Arthritis   . [name]'s esophagus 2007  . COLONIC POLYPS, ADENOMATOUS 08/01/2008, 2013, 2014  . COPD (chronic obstructive pulmonary disease) (HCC)    no per pt  . Depression   . Diabetes type 2, controlled ([place]) 03/13/2007  . Diverticulitis   . ED (erectile dysfunction)   . EMPHYSEMA 11/08/2008   no per pt  . Esophageal cancer ([place]) "dx'd ~ 01/2015"  . GERD 03/13/2007  . GIB (gastrointestinal bleeding) 08/18/2015  . Hiatal hernia   . History of blood transfusion 08/18/2015   due to GIB  . HYPERCHOLESTEROLEMIA 02/03/2008  . HYPERTENSION 03/13/2007   pt denies, claims white coat syndrome  . Prostatism   . PULMONARY NODULE 11/24/2008  . Stomach cancer ([place]) "dx'd ~ 01/2015"  . TOBACCO ABUSE 11/24/2008    Past Surgical History:  Procedure Laterality Date  . BALLOON DILATION N/A 03/19/2016   Procedure: BALLOON DILATION;  Surgeon: Irene Shipper, MD;  [facility];  Service: Endoscopy;  Laterality: N/A;  . CIRCUMCISION    . COLONOSCOPY    . COMPLETE ESOPHAGECTOMY N/A 07/25/2015   Procedure: TRANSHIATAL TOTAL ESOPHAGECTOMY COMPLETE PYLOROMYOTOMY;  Surgeon: [name], MD;  Location: [place];  Service: Thoracic;   Laterality: N/A;  . EGD  11/19/2005  . ESOPHAGOGASTRODUODENOSCOPY N/A 03/19/2016   Procedure: ESOPHAGOGASTRODUODENOSCOPY (EGD);  Surgeon: Irene Shipper, MD;  [facility];  Service: Endoscopy;  Laterality: N/A;  . EUS N/A 03/16/2015   Procedure: UPPER ENDOSCOPIC ULTRASOUND (EUS) RADIAL;  Surgeon: Milus Banister, MD;  [facility];  Service: Endoscopy;  Laterality: N/A;  . FOOT FRACTURE SURGERY Right 1980   right foot w/ pins and screws  . JEJUNOSTOMY N/A 07/25/2015   Procedure: FEEDING JEJUNOSTOMY;  Surgeon: [name], MD;  Location: [place];  Service: Thoracic;  Laterality: N/A;  . POLYPECTOMY    . removal pins and screws foot  1980   right foot  . UPPER GASTROINTESTINAL ENDOSCOPY    . VASECTOMY    . VIDEO BRONCHOSCOPY N/A 07/25/2015   Procedure: VIDEO BRONCHOSCOPY;  Surgeon: [name], MD;  Location[facility];  Service: Thoracic;  Laterality: N/A;    There were no vitals filed for this visit.       Subjective Assessment - 09/10/16 0924    Subjective Pt arriving to therapy reporting a R frozen shoulder. Pt with no pain at rest, but reports limited mobility and difficutly with ADL's and household chores. Pt reporting 4/10 pain with reaching.    Pertinent History history of abdominal, lymphatic, esophageal CA   Limitations House hold activities;Other (comment)  reaching   How long can you sit comfortably?  unlimited   How long can you stand comfortably? unlimited   How long can you walk comfortably? unlimited   Currently in Pain? Yes   Pain Score 4   with reaching, no pain at rest   Pain Location Shoulder   Pain Orientation Right   Pain Descriptors / Indicators Dull   Pain Type Chronic pain   Pain Onset More than a month ago   Pain Frequency Intermittent   Aggravating Factors  Reaching overhead,    Pain Relieving Factors resting   Effect of Pain on Daily Activities difficutly getting on a coat, difficulty reaching overhead and performing yard work and  house hold chores            [facility] PT Assessment - 09/10/16 0001      Assessment   Medical Diagnosis R frozen shoulder   Referring Provider Teresa Coombs, MD   Hand Dominance Right   Next MD Visit 8 weeks   Prior Therapy none     Precautions   Precautions None     Restrictions   Weight Bearing Restrictions No     Balance Screen   Has the patient fallen in the past 6 months No   Is the patient reluctant to leave their home because of a fear of falling?  No     Home Environment   Living Environment Private residence   Living Arrangements Spouse/significant other   Type of [place] to enter   Entrance Stairs-Number of Steps none   Entrance Stairs-Rails None   Home Layout Two level;Able to live on main level with bedroom/bathroom     Prior Function   Level of Independence Independent   Vocation Retired   Administrator, Civil Service, Cabin crew     Cognition   Overall Cognitive Status Within Functional Limits for tasks assessed     Posture/Postural Control   Posture/Postural Control Postural limitations   Postural Limitations Rounded Shoulders;Forward head     ROM / Strength   AROM / PROM / Strength AROM;PROM;Strength     AROM   AROM Assessment Site Shoulder;Elbow   Right/Left Shoulder Right;Left   Right Shoulder Extension 60 Degrees   Right Shoulder Flexion 140 Degrees   Right Shoulder ABduction 90 Degrees   Right Shoulder Internal Rotation 70 Degrees   Right Shoulder External Rotation 45 Degrees   Left Shoulder Extension 70 Degrees   Left Shoulder Flexion 160 Degrees   Left Shoulder ABduction 135 Degrees   Left Shoulder Internal Rotation 70 Degrees   Left Shoulder External Rotation 65 Degrees   Right/Left Elbow --  [facility]     Strength   Overall Strength Comments bilatreal elbow and grip strength WFL, left shoulder grossly 5/5   Strength Assessment Site Shoulder   Right/Left Shoulder Right   Right Shoulder Flexion 5/5   Right Shoulder  Extension 5/5   Right Shoulder ABduction 4/5  painful   Right Shoulder Internal Rotation 4/5  painful   Right Shoulder External Rotation 4/5  painful     Transfers   Transfers Sit to Stand   Sit to Stand 7: Independent     Ambulation/Gait   Ambulation/Gait Yes   Ambulation Distance (Feet) 60 Feet   Assistive device None   Gait Pattern Step-through pattern;Decreased arm swing - right                           PT Education - 09/10/16 [id number]  Education provided Yes   Education Details HEP, posture correction, importance of mobility   Person(s) Educated Patient   Methods Explanation;Demonstration;Tactile cues;Verbal cues;Handout   Comprehension Verbalized understanding;Returned demonstration          PT Short Term Goals - 09/10/16 1028      PT SHORT TERM GOAL #1   Title Pt will be independent with her HEP.    Time 4   Period Weeks   Status New     PT SHORT TERM GOAL #2   Title Pt will be able to increase R shoulder flexion to >/=150 degrees in order to improve functional mobility.    Baseline 140   Time 4   Period Weeks   Status New           PT Long Term Goals - 09/10/16 1030      PT LONG TERM GOAL #1   Title Pt will be able to increase his FOTO score from 60 to 75% function.    Baseline 60 % function   Time 8   Period Weeks   Status New     PT LONG TERM GOAL #2   Title Pt will be able to don his coat without difficulty and with no increased pain.    Time 8   Period Weeks   Status New     PT LONG TERM GOAL #3   Title Pt will improve R shoulder flexion to 160 degress in order to improve functional mobility.    Time 8   Period Weeks   Status New     PT LONG TERM GOAL #4   Title Pt will report no pain with over head reaching for ADL's   Time 8   Period Weeks   Status New     PT LONG TERM GOAL #5   Title Pt will improve R shoulder strength to 5/5 to improve abilty to perform leisure activities/household chores.    Time 8    Period Weeks   Status New               Plan - 09/10/16 [id number]    Clinical Impression Statement Pt arriving to therapy as a low complexity evalaution, complaining of pain in R shoulder with reaching activities and dx of frozen shoulder per MD. Pt reporting history of abdominal, esophegeal, and lymphatic CA and while undergoing rehab and recovery he was limited with mobility. Pt feels this is what caused his shoulder to stiffen overtime which lead to pain. Pt with limited ROM and strength in his R shoulder. Pt reporting pain of 4/10 with reaching and overhead activities. Skilled PT needed to assist pt with ROM, strength, and functional moblity.     Rehab Potential Good   Clinical Impairments Affecting Rehab Potential history of Abdominal, Esophegeal, and lymphatic Cancer.    PT Frequency 2x / week   PT Duration 8 weeks   PT Treatment/Interventions ADLs/Self Care Home Management;Moist Heat;Ultrasound;Iontophoresis 4mg /ml Dexamethasone;Electrical Stimulation;Cryotherapy;Functional mobility training;Therapeutic activities;Therapeutic exercise;Passive range of motion;Manual techniques;Patient/family education;Dry needling;Taping   PT Next Visit Plan Shoulder ROM, strengthing, HEP review   PT Home Exercise Plan Cane exercises: flexion, extension, abduction   Consulted and Agree with Plan of Care Patient      Patient will benefit from skilled therapeutic intervention in order to improve the following deficits and impairments:  Pain, Postural dysfunction, Impaired perceived functional ability, Decreased range of motion, Impaired UE functional use, Decreased strength, Decreased mobility  Visit Diagnosis: Stiffness of right shoulder, not  elsewhere classified  Chronic right shoulder pain  Muscle weakness (generalized)      G-Codes - September 20, 2016 1034    Functional Limitation Carrying, moving and handling objects   Carrying, Moving and Handling Objects Current Status [phone]) At least 20 percent  but less than 40 percent impaired, limited or restricted   Carrying, Moving and Handling Objects Goal Status [id number]) At least 1 percent but less than 20 percent impaired, limited or restricted       Problem List Patient Active Problem List   Diagnosis Date Noted  . Adhesive capsulitis of right shoulder associated with type 2 diabetes mellitus ([place]) 09/02/2016  . Esophageal stricture   . Dysphagia 01/23/2016  . IDDM (insulin dependent diabetes mellitus) ([place]) 01/23/2016  . Controlled type 2 diabetes mellitus with diabetic peripheral angiopathy without gangrene, with long-term current use of insulin ([place]) 10/30/2015  . Fever 09/01/2015  . Diabetes mellitus due to underlying condition, uncontrolled, with circulatory complication ([place])   . Chronic diastolic heart failure ([place])   . Anemia requiring transfusions   . Hx pulmonary embolism   . Elevated troponin   . GI bleed 08/18/2015  . Symptomatic anemia 08/18/2015  . Pulmonary embolism ([place]) 08/18/2015  . HLD (hyperlipidemia) 08/18/2015  . GIB (gastrointestinal bleeding) 08/18/2015  . Gastrointestinal hemorrhage with melena   . Esophageal cancer ([place]) 07/25/2015  . LUQ abdominal pain 07/07/2015  . 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 Chest pain 12/02/2014  . Quit smoking within past year 11/04/2013  . Screening for prostate cancer 11/04/2013  . Encounter for long-term (current) use of other medications 11/04/2013  . Routine general medical examination at a health care facility 11/08/2012  . Gastroenteritis 09/10/2011  . PROTEINURIA, MILD 08/15/2009  . ANXIETY STATE, UNSPECIFIED 12/15/2008  . PULMONARY NODULE 11/24/2008  . EMPHYSEMA 11/08/2008  . COLONIC POLYPS, ADENOMATOUS 08/01/2008  . UNSPECIFIED INFLAMMATORY AND TOXIC NEUROPATHY 05/19/2008  . CHEST PAIN, PLEURITIC 05/19/2008  . HYPERCHOLESTEROLEMIA 02/03/2008  . Depression with anxiety 03/13/2007  . Essential hypertension 03/13/2007  . GERD 03/13/2007    Oretha Caprice , MPT September 20, 2016, 10:39 AM  [facility] [address] [place], [place], [address] Phone: [phone]   Fax:  [phone]  Name: LOAL BERWANGER MRN: [id number] Date of Birth: [birth year]

## 2016-09-12 ENCOUNTER — Ambulatory Visit: Payer: Medicare Other | Admitting: Physical Therapy

## 2016-09-12 DIAGNOSIS — M25611 Stiffness of right shoulder, not elsewhere classified: Secondary | ICD-10-CM

## 2016-09-12 DIAGNOSIS — G8929 Other chronic pain: Secondary | ICD-10-CM | POA: Diagnosis not present

## 2016-09-12 DIAGNOSIS — M25511 Pain in right shoulder: Secondary | ICD-10-CM

## 2016-09-12 DIAGNOSIS — M6281 Muscle weakness (generalized): Secondary | ICD-10-CM

## 2016-09-12 NOTE — Therapy (Signed)
[place], [place], [address] Phone: [phone]   Fax:  [phone]  Physical Therapy Treatment  Patient Details  Name: Ronald Johnson MRN: [id number] Date of Birth: 10/24/1945 Referring Provider: Teresa Coombs, MD  Encounter Date: 09/12/2016      PT End of Session - 09/12/16 1223    Visit Number 2   Number of Visits 16   Date for PT Re-Evaluation 11/08/16   Authorization Type KX modifier after 15 visits   PT Start Time [id number]   PT Stop Time 1230   PT Time Calculation (min) 35 min   Activity Tolerance Patient tolerated treatment well   Behavior During Therapy [facility] for tasks assessed/performed      Past Medical History:  Diagnosis Date  . Anemia 08/15/2009  . Anxiety   . Arthritis   . [name]'s esophagus 2007  . COLONIC POLYPS, ADENOMATOUS 08/01/2008, 2013, 2014  . COPD (chronic obstructive pulmonary disease) (HCC)    no per pt  . Depression   . Diabetes type 2, controlled ([place]) 03/13/2007  . Diverticulitis   . ED (erectile dysfunction)   . EMPHYSEMA 11/08/2008   no per pt  . Esophageal cancer ([place]) "dx'd ~ 01/2015"  . GERD 03/13/2007  . GIB (gastrointestinal bleeding) 08/18/2015  . Hiatal hernia   . History of blood transfusion 08/18/2015   due to GIB  . HYPERCHOLESTEROLEMIA 02/03/2008  . HYPERTENSION 03/13/2007   pt denies, claims white coat syndrome  . Prostatism   . PULMONARY NODULE 11/24/2008  . Stomach cancer ([employer]) "dx'd ~ 01/2015"  . TOBACCO ABUSE 11/24/2008    Past Surgical History:  Procedure Laterality Date  . BALLOON DILATION N/A 03/19/2016   Procedure: BALLOON DILATION;  Surgeon: Irene Shipper, MD;  [facility];  Service: Endoscopy;  Laterality: N/A;  . CIRCUMCISION    . COLONOSCOPY    . COMPLETE ESOPHAGECTOMY N/A 07/25/2015   Procedure: TRANSHIATAL TOTAL ESOPHAGECTOMY COMPLETE PYLOROMYOTOMY;  Surgeon: [name], MD;  Location: [facility];  Service: Thoracic;  Laterality:  N/A;  . EGD  11/19/2005  . ESOPHAGOGASTRODUODENOSCOPY N/A 03/19/2016   Procedure: ESOPHAGOGASTRODUODENOSCOPY (EGD);  Surgeon: Irene Shipper, MD;  [facility];  Service: Endoscopy;  Laterality: N/A;  . EUS N/A 03/16/2015   Procedure: UPPER ENDOSCOPIC ULTRASOUND (EUS) RADIAL;  Surgeon: Milus Banister, MD;  [facility];  Service: Endoscopy;  Laterality: N/A;  . FOOT FRACTURE SURGERY Right 1980   right foot w/ pins and screws  . JEJUNOSTOMY N/A 07/25/2015   Procedure: FEEDING JEJUNOSTOMY;  Surgeon: [name], MD;  Location: [facility];  Service: Thoracic;  Laterality: N/A;  . POLYPECTOMY    . removal pins and screws foot  1980   right foot  . UPPER GASTROINTESTINAL ENDOSCOPY    . VASECTOMY    . VIDEO BRONCHOSCOPY N/A 07/25/2015   Procedure: VIDEO BRONCHOSCOPY;  Surgeon: [name], MD;  Location: [facility] OR;  Service: Thoracic;  Laterality: N/A;    There were no vitals filed for this visit.      Subjective Assessment - 09/12/16 1215    Subjective pt arriving to therapy today reporting stiffness in R shoulder. No pain reported at rest, pain reported with extension and abduction.    Pertinent History history of abdominal, lymphatic, esophageal CA   Limitations House hold activities;Other (comment)   How long can you sit comfortably? unlimited   How long can you stand comfortably? unlimited   How long  can you walk comfortably? unlimited   Currently in Pain? No/denies   Pain Onset More than a month ago   Pain Frequency Intermittent   Aggravating Factors  Reaching, overhead activities, outward reaching, lifting   Pain Relieving Factors resting   Effect of Pain on Daily Activities difficulty getting a coat on, difficulty with household chores, yard work                         [employer] Adult PT Treatment/Exercise - 09/12/16 0001      Exercises   Exercises Shoulder     Shoulder Exercises: Supine   Flexion 15 reps  with cane   ABduction 15 reps   with cane   Other Supine Exercises PROM R UE     Shoulder Exercises: Standing   Row 15 reps;Theraband   Theraband Level (Shoulder Row) Level 3 (Green)     Shoulder Exercises: Pulleys   Flexion 2 minutes   ABduction 2 minutes                PT Education - 09/12/16 1222    Education provided Yes   Education Details Reviewed HEP   Person(s) Educated Patient   Methods Explanation;Handout;Demonstration;Tactile cues   Comprehension Verbalized understanding;Returned demonstration          PT Short Term Goals - 09/12/16 1230      PT SHORT TERM GOAL #1   Title Pt will be independent with her HEP.    Time 4   Period Weeks   Status New     PT SHORT TERM GOAL #2   Title Pt will be able to increase R shoulder flexion to >/=150 degrees in order to improve functional mobility.    Baseline 140   Time 4   Status New           PT Long Term Goals - 09/10/16 1030      PT LONG TERM GOAL #1   Title Pt will be able to increase his FOTO score from 60 to 75% function.    Baseline 60 % function   Time 8   Period Weeks   Status New     PT LONG TERM GOAL #2   Title Pt will be able to don his coat without difficulty and with no increased pain.    Time 8   Period Weeks   Status New     PT LONG TERM GOAL #3   Title Pt will improve R shoulder flexion to 160 degress in order to improve functional mobility.    Time 8   Period Weeks   Status New     PT LONG TERM GOAL #4   Title Pt will report no pain with over head reaching for ADL's   Time 8   Period Weeks   Status New     PT LONG TERM GOAL #5   Title Pt will improve R shoulder strength to 5/5 to improve abilty to perform leisure activities/household chores.    Time 8   Period Weeks   Status New               Plan - 09/12/16 1223    Clinical Impression Statement Pt arriving today reporting he needed a new copy of the exercises created at last visit. Pt reported he misplaced his handout issued. Pt still  complaining of stiffness in his R shoulder. Pt tolerated treatment well today. Cotninue skilled PT for improved functional.  Rehab Potential Good   Clinical Impairments Affecting Rehab Potential history of Abdominal, Esophegeal, and lymphatic Cancer.    PT Frequency 2x / week   PT Duration 8 weeks   PT Treatment/Interventions ADLs/Self Care Home Management;Moist Heat;Ultrasound;Iontophoresis 4mg /ml Dexamethasone;Electrical Stimulation;Cryotherapy;Functional mobility training;Therapeutic activities;Therapeutic exercise;Passive range of motion;Manual techniques;Patient/family education;Dry needling;Taping   PT Next Visit Plan Shoulder ROM, strengthing, HEP review   PT Home Exercise Plan Cane exercises: flexion, extension, abduction   Consulted and Agree with Plan of Care Patient      Patient will benefit from skilled therapeutic intervention in order to improve the following deficits and impairments:  Pain, Postural dysfunction, Impaired perceived functional ability, Decreased range of motion, Impaired UE functional use, Decreased strength, Decreased mobility  Visit Diagnosis: Stiffness of right shoulder, not elsewhere classified  Chronic right shoulder pain  Muscle weakness (generalized)     Problem List Patient Active Problem List   Diagnosis Date Noted  . Adhesive capsulitis of right shoulder associated with type 2 diabetes mellitus ([place]) 09/02/2016  . Esophageal stricture   . Dysphagia 01/23/2016  . IDDM (insulin dependent diabetes mellitus) ([place]) 01/23/2016  . Controlled type 2 diabetes mellitus with diabetic peripheral angiopathy without gangrene, with long-term current use of insulin ([place]) 10/30/2015  . Fever 09/01/2015  . Diabetes mellitus due to underlying condition, uncontrolled, with circulatory complication ([place])   . Chronic diastolic heart failure ([place])   . Anemia requiring transfusions   . Hx pulmonary embolism   . Elevated troponin   . GI bleed 08/18/2015  .  Symptomatic anemia 08/18/2015  . Pulmonary embolism ([place]) 08/18/2015  . HLD (hyperlipidemia) 08/18/2015  . GIB (gastrointestinal bleeding) 08/18/2015  . Gastrointestinal hemorrhage with melena   . Esophageal cancer ([place]) 07/25/2015  . LUQ abdominal pain 07/07/2015  . 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 Chest pain 12/02/2014  . Quit smoking within past year 11/04/2013  . Screening for prostate cancer 11/04/2013  . Encounter for long-term (current) use of other medications 11/04/2013  . Routine general medical examination at a health care facility 11/08/2012  . Gastroenteritis 09/10/2011  . PROTEINURIA, MILD 08/15/2009  . ANXIETY STATE, UNSPECIFIED 12/15/2008  . PULMONARY NODULE 11/24/2008  . EMPHYSEMA 11/08/2008  . COLONIC POLYPS, ADENOMATOUS 08/01/2008  . UNSPECIFIED INFLAMMATORY AND TOXIC NEUROPATHY 05/19/2008  . CHEST PAIN, PLEURITIC 05/19/2008  . HYPERCHOLESTEROLEMIA 02/03/2008  . Depression with anxiety 03/13/2007  . Essential hypertension 03/13/2007  . GERD 03/13/2007    Oretha Caprice, MPT  09/12/2016, 12:31 PM  [facility] [address] [place], [place], [address] Phone: [phone]   Fax:  [phone]  Name: TRIG PARADY MRN: [id number] Date of Birth: 20-Jul-1946

## 2016-09-12 NOTE — Patient Instructions (Signed)
Patient Instructions Encounter Date: 09/10/2016 9:30 AM Oretha Caprice, PT  Physical Therapy

## 2016-09-17 ENCOUNTER — Ambulatory Visit: Payer: Medicare Other | Admitting: Physical Therapy

## 2016-09-17 DIAGNOSIS — M6281 Muscle weakness (generalized): Secondary | ICD-10-CM

## 2016-09-17 DIAGNOSIS — G8929 Other chronic pain: Secondary | ICD-10-CM

## 2016-09-17 DIAGNOSIS — M25511 Pain in right shoulder: Secondary | ICD-10-CM

## 2016-09-17 DIAGNOSIS — M25611 Stiffness of right shoulder, not elsewhere classified: Secondary | ICD-10-CM

## 2016-09-17 NOTE — Patient Instructions (Signed)
Doorway stretch issued from ex drawer. 1 x a day 3 X 30 seconds

## 2016-09-17 NOTE — Therapy (Signed)
[place], [place], [address] Phone: [phone]   Fax:  [phone]  Physical Therapy Treatment  Patient Details  Name: MUADH MYNATT MRN: [id number] Date of Birth: April 19, 1946 Referring Provider: Teresa Coombs, MD  Encounter Date: 09/17/2016      PT End of Session - 09/17/16 1033    Visit Number 3   Number of Visits 16   Date for PT Re-Evaluation 11/08/16   PT Start Time 0932   PT Stop Time 1015   PT Time Calculation (min) 43 min   Activity Tolerance Patient tolerated treatment well   Behavior During Therapy [facility] for tasks assessed/performed      Past Medical History:  Diagnosis Date  . Anemia 08/15/2009  . Anxiety   . Arthritis   . [name]'s esophagus 2007  . COLONIC POLYPS, ADENOMATOUS 08/01/2008, 2013, 2014  . COPD (chronic obstructive pulmonary disease) (HCC)    no per pt  . Depression   . Diabetes type 2, controlled ([place]) 03/13/2007  . Diverticulitis   . ED (erectile dysfunction)   . EMPHYSEMA 11/08/2008   no per pt  . Esophageal cancer ([place]) "dx'd ~ 01/2015"  . GERD 03/13/2007  . GIB (gastrointestinal bleeding) 08/18/2015  . Hiatal hernia   . History of blood transfusion 08/18/2015   due to GIB  . HYPERCHOLESTEROLEMIA 02/03/2008  . HYPERTENSION 03/13/2007   pt denies, claims white coat syndrome  . Prostatism   . PULMONARY NODULE 11/24/2008  . Stomach cancer ([place]) "dx'd ~ 01/2015"  . TOBACCO ABUSE 11/24/2008    Past Surgical History:  Procedure Laterality Date  . BALLOON DILATION N/A 03/19/2016   Procedure: BALLOON DILATION;  Surgeon: Irene Shipper, MD;  [facility];  Service: Endoscopy;  Laterality: N/A;  . CIRCUMCISION    . COLONOSCOPY    . COMPLETE ESOPHAGECTOMY N/A 07/25/2015   Procedure: TRANSHIATAL TOTAL ESOPHAGECTOMY COMPLETE PYLOROMYOTOMY;  Surgeon: [name], MD;  Location: [facility];  Service: Thoracic;  Laterality: N/A;  . EGD  11/19/2005  .  ESOPHAGOGASTRODUODENOSCOPY N/A 03/19/2016   Procedure: ESOPHAGOGASTRODUODENOSCOPY (EGD);  Surgeon: Irene Shipper, MD;  [facility];  Service: Endoscopy;  Laterality: N/A;  . EUS N/A 03/16/2015   Procedure: UPPER ENDOSCOPIC ULTRASOUND (EUS) RADIAL;  Surgeon: Milus Banister, MD;  [facility];  Service: Endoscopy;  Laterality: N/A;  . FOOT FRACTURE SURGERY Right 1980   right foot w/ pins and screws  . JEJUNOSTOMY N/A 07/25/2015   Procedure: FEEDING JEJUNOSTOMY;  Surgeon: [name], MD;  Location: [facility];  Service: Thoracic;  Laterality: N/A;  . POLYPECTOMY    . removal pins and screws foot  1980   right foot  . UPPER GASTROINTESTINAL ENDOSCOPY    . VASECTOMY    . VIDEO BRONCHOSCOPY N/A 07/25/2015   Procedure: VIDEO BRONCHOSCOPY;  Surgeon: [name], MD;  Location: [facility] OR;  Service: Thoracic;  Laterality: N/A;    There were no vitals filed for this visit.      Subjective Assessment - 09/17/16 0936    Subjective I am able to reach better.  I do my exercises 8 x a day.  It is stiff.    Currently in Pain? No/denies   Pain Location Shoulder            [facility] PT Assessment - 09/17/16 0001      AROM   Right Shoulder Flexion 130 Degrees  prior to exercise  [facility] Adult PT Treatment/Exercise - 09/17/16 0001      Shoulder Exercises: Supine   External Rotation AAROM  foam roll 3" at elbow fist on forehead moving elbowsin/out   Internal Rotation PROM   Flexion AROM;5 reps   Other Supine Exercises supine scapular stabilization.  10 X green, narrow grip,  ER both, Horizontal abduction   sash with yellow band, right     Shoulder Exercises: Pulleys   Flexion 3 minutes     Shoulder Exercises: Stretch   Corner Stretch Limitations single arm doorway ER stretch 3 x 30 seconds  issued for HEP after cues     Manual Therapy   Manual Therapy Joint mobilization;Soft tissue mobilization;Passive ROM   Manual therapy comments  tissue more mobile post manual   Joint Mobilization Grade 2 posterior and inferior capsule right shoulder   Soft tissue mobilization teres area tissue softened   Passive ROM Supine right shoulder                PT Education - 09/17/16 1032    Education provided Yes   Education Details HEP   Person(s) Educated Patient   Methods Explanation;Demonstration;Verbal cues;Handout   Comprehension Verbalized understanding;Returned demonstration          PT Short Term Goals - 09/17/16 1035      PT SHORT TERM GOAL #1   Title Pt will be independent with his HEP.    Baseline independent with thosee issued so far   Time 4   Period Weeks   Status On-going     PT SHORT TERM GOAL #2   Title Pt will be able to increase R shoulder flexion to >/=150 degrees in order to improve functional mobility.    Time 4   Period Weeks   Status On-going           PT Long Term Goals - 09/10/16 1030      PT LONG TERM GOAL #1   Title Pt will be able to increase his FOTO score from 60 to 75% function.    Baseline 60 % function   Time 8   Period Weeks   Status New     PT LONG TERM GOAL #2   Title Pt will be able to don his coat without difficulty and with no increased pain.    Time 8   Period Weeks   Status New     PT LONG TERM GOAL #3   Title Pt will improve R shoulder flexion to 160 degress in order to improve functional mobility.    Time 8   Period Weeks   Status New     PT LONG TERM GOAL #4   Title Pt will report no pain with over head reaching for ADL's   Time 8   Period Weeks   Status New     PT LONG TERM GOAL #5   Title Pt will improve R shoulder strength to 5/5 to improve abilty to perform leisure activities/household chores.    Time 8   Period Weeks   Status New               Plan - 09/17/16 1033    Clinical Impression Statement Mild pain increased after session.  Patient daclined the need of modalities for pain. Progress toward HEP goal,  130 AROM prior to  exercise, flexion right shoulder.   PT Next Visit Plan Shoulder ROM, strengthing, HEP review   PT Home Exercise Plan Cane exercises: flexion, extension,  abduction,  ER stretch at doorway   Consulted and Agree with Plan of Care Patient      Patient will benefit from skilled therapeutic intervention in order to improve the following deficits and impairments:  Pain, Postural dysfunction, Impaired perceived functional ability, Decreased range of motion, Impaired UE functional use, Decreased strength, Decreased mobility  Visit Diagnosis: Stiffness of right shoulder, not elsewhere classified  Chronic right shoulder pain  Muscle weakness (generalized)     Problem List Patient Active Problem List   Diagnosis Date Noted  . Adhesive capsulitis of right shoulder associated with type 2 diabetes mellitus ([place]) 09/02/2016  . Esophageal stricture   . Dysphagia 01/23/2016  . IDDM (insulin dependent diabetes mellitus) ([place]) 01/23/2016  . Controlled type 2 diabetes mellitus with diabetic peripheral angiopathy without gangrene, with long-term current use of insulin ([place]) 10/30/2015  . Fever 09/01/2015  . Diabetes mellitus due to underlying condition, uncontrolled, with circulatory complication ([place])   . Chronic diastolic heart failure ([place])   . Anemia requiring transfusions   . Hx pulmonary embolism   . Elevated troponin   . GI bleed 08/18/2015  . Symptomatic anemia 08/18/2015  . Pulmonary embolism ([place]) 08/18/2015  . HLD (hyperlipidemia) 08/18/2015  . GIB (gastrointestinal bleeding) 08/18/2015  . Gastrointestinal hemorrhage with melena   . Esophageal cancer ([place]) 07/25/2015  . LUQ abdominal pain 07/07/2015  . 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 Chest pain 12/02/2014  . Quit smoking within past year 11/04/2013  . Screening for prostate cancer 11/04/2013  . Encounter for long-term (current) use of other medications 11/04/2013  . Routine general medical examination at a health care  facility 11/08/2012  . Gastroenteritis 09/10/2011  . PROTEINURIA, MILD 08/15/2009  . ANXIETY STATE, UNSPECIFIED 12/15/2008  . PULMONARY NODULE 11/24/2008  . EMPHYSEMA 11/08/2008  . COLONIC POLYPS, ADENOMATOUS 08/01/2008  . UNSPECIFIED INFLAMMATORY AND TOXIC NEUROPATHY 05/19/2008  . CHEST PAIN, PLEURITIC 05/19/2008  . HYPERCHOLESTEROLEMIA 02/03/2008  . Depression with anxiety 03/13/2007  . Essential hypertension 03/13/2007  . GERD 03/13/2007    Shine Scrogham PTA 09/17/2016, 10:37 AM  [facility] [address] [place], [place], [address] Phone: [phone]   Fax:  [phone]  Name: KELWIN FARAONE MRN: [id number] Date of Birth: [birth year]

## 2016-09-19 ENCOUNTER — Encounter: Payer: Self-pay | Admitting: Physical Therapy

## 2016-09-19 ENCOUNTER — Ambulatory Visit: Payer: Medicare Other | Attending: Sports Medicine | Admitting: Physical Therapy

## 2016-09-19 DIAGNOSIS — M25511 Pain in right shoulder: Secondary | ICD-10-CM | POA: Insufficient documentation

## 2016-09-19 DIAGNOSIS — M25611 Stiffness of right shoulder, not elsewhere classified: Secondary | ICD-10-CM | POA: Diagnosis not present

## 2016-09-19 DIAGNOSIS — M6281 Muscle weakness (generalized): Secondary | ICD-10-CM | POA: Diagnosis not present

## 2016-09-19 DIAGNOSIS — G8929 Other chronic pain: Secondary | ICD-10-CM | POA: Diagnosis not present

## 2016-09-19 NOTE — Therapy (Signed)
[place], [place], [address] Phone: [phone]   Fax:  [phone]  Physical Therapy Treatment  Patient Details  Name: Ronald Johnson MRN: [id number] Date of Birth: 10/07/1945 Referring Provider: Teresa Coombs, MD  Encounter Date: 09/19/2016      PT End of Session - 09/19/16 0846    Visit Number 4   Number of Visits 16   Date for PT Re-Evaluation 11/08/16   Authorization Type KX modifier after 15 visits   PT Start Time 0846   PT Stop Time 0926   PT Time Calculation (min) 40 min   Activity Tolerance Patient tolerated treatment well   Behavior During Therapy [facility] for tasks assessed/performed      Past Medical History:  Diagnosis Date  . Anemia 08/15/2009  . Anxiety   . Arthritis   . [name]'s esophagus 2007  . COLONIC POLYPS, ADENOMATOUS 08/01/2008, 2013, 2014  . COPD (chronic obstructive pulmonary disease) (HCC)    no per pt  . Depression   . Diabetes type 2, controlled ([place]) 03/13/2007  . Diverticulitis   . ED (erectile dysfunction)   . EMPHYSEMA 11/08/2008   no per pt  . Esophageal cancer ([place]) "dx'd ~ 01/2015"  . GERD 03/13/2007  . GIB (gastrointestinal bleeding) 08/18/2015  . Hiatal hernia   . History of blood transfusion 08/18/2015   due to GIB  . HYPERCHOLESTEROLEMIA 02/03/2008  . HYPERTENSION 03/13/2007   pt denies, claims white coat syndrome  . Prostatism   . PULMONARY NODULE 11/24/2008  . Stomach cancer ([place]) "dx'd ~ 01/2015"  . TOBACCO ABUSE 11/24/2008    Past Surgical History:  Procedure Laterality Date  . BALLOON DILATION N/A 03/19/2016   Procedure: BALLOON DILATION;  Surgeon: Irene Shipper, MD;  [facility];  Service: Endoscopy;  Laterality: N/A;  . CIRCUMCISION    . COLONOSCOPY    . COMPLETE ESOPHAGECTOMY N/A 07/25/2015   Procedure: TRANSHIATAL TOTAL ESOPHAGECTOMY COMPLETE PYLOROMYOTOMY;  Surgeon: [name], MD;  Location: [place];  Service: Thoracic;  Laterality:  N/A;  . EGD  11/19/2005  . ESOPHAGOGASTRODUODENOSCOPY N/A 03/19/2016   Procedure: ESOPHAGOGASTRODUODENOSCOPY (EGD);  Surgeon: Irene Shipper, MD;  [facility];  Service: Endoscopy;  Laterality: N/A;  . EUS N/A 03/16/2015   Procedure: UPPER ENDOSCOPIC ULTRASOUND (EUS) RADIAL;  Surgeon: Milus Banister, MD;  [facility];  Service: Endoscopy;  Laterality: N/A;  . FOOT FRACTURE SURGERY Right 1980   right foot w/ pins and screws  . JEJUNOSTOMY N/A 07/25/2015   Procedure: FEEDING JEJUNOSTOMY;  Surgeon: [name], MD;  Location: [place];  Service: Thoracic;  Laterality: N/A;  . POLYPECTOMY    . removal pins and screws foot  1980   right foot  . UPPER GASTROINTESTINAL ENDOSCOPY    . VASECTOMY    . VIDEO BRONCHOSCOPY N/A 07/25/2015   Procedure: VIDEO BRONCHOSCOPY;  Surgeon: [name], MD;  Location: [facility] OR;  Service: Thoracic;  Laterality: N/A;    There were no vitals filed for this visit.      Subjective Assessment - 09/19/16 0846    Subjective pt reports feeling nauseated today. reports shoulder feels fine today. "I know it's there but it doesnt bother me"   Currently in Pain? No/denies            [facility] PT Assessment - 09/19/16 0001      AROM   Right Shoulder Flexion 142 Degrees  [place] Adult PT Treatment/Exercise - 09/19/16 0001      Shoulder Exercises: Supine   Flexion Limitations x10 with wand     Shoulder Exercises: Standing   External Rotation 15 reps   Theraband Level (Shoulder External Rotation) Level 2 (Red)   Row 20 reps   Theraband Level (Shoulder Row) Level 3 (Green)   Other Standing Exercises serratus ABCs at wall   Other Standing Exercises wall triceps push ups     Shoulder Exercises: Pulleys   Flexion 2 minutes   ABduction 2 minutes     Shoulder Exercises: ROM/Strengthening   UBE (Upper Arm Bike) L1 retro 3 min     Shoulder Exercises: Stretch   Corner Stretch Limitations attempted door stretch,  pain     Manual Therapy   Joint Mobilization post Gr 4 paired with ER at 90 abd, joint distraction at available ranges of abduction   Soft tissue mobilization subscapularis releast, pectoralis release   Passive ROM flexion, external rotaiton                PT Education - 09/19/16 0849    Education provided Yes   Education Details exercise form/rationale   Person(s) Educated Patient   Methods Explanation;Demonstration;Tactile cues;Verbal cues   Comprehension Verbalized understanding;Returned demonstration;Verbal cues required;Tactile cues required;Need further instruction          PT Short Term Goals - 09/17/16 1035      PT SHORT TERM GOAL #1   Title Pt will be independent with his HEP.    Baseline independent with thosee issued so far   Time 4   Period Weeks   Status On-going     PT SHORT TERM GOAL #2   Title Pt will be able to increase R shoulder flexion to >/=150 degrees in order to improve functional mobility.    Time 4   Period Weeks   Status On-going           PT Long Term Goals - 09/10/16 1030      PT LONG TERM GOAL #1   Title Pt will be able to increase his FOTO score from 60 to 75% function.    Baseline 60 % function   Time 8   Period Weeks   Status New     PT LONG TERM GOAL #2   Title Pt will be able to don his coat without difficulty and with no increased pain.    Time 8   Period Weeks   Status New     PT LONG TERM GOAL #3   Title Pt will improve R shoulder flexion to 160 degress in order to improve functional mobility.    Time 8   Period Weeks   Status New     PT LONG TERM GOAL #4   Title Pt will report no pain with over head reaching for ADL's   Time 8   Period Weeks   Status New     PT LONG TERM GOAL #5   Title Pt will improve R shoulder strength to 5/5 to improve abilty to perform leisure activities/household chores.    Time 8   Period Weeks   Status New               Plan - 09/19/16 [id number]    Clinical Impression  Statement Tightness noted in subscapularis muscle which was stretched today. Cuing for scapular retraction to bring posture more upright to allow for more ROM. Pt did not have pain with activiites  or manual until attempting door stretch which sent referred pain into arm. Pt will cont to benefit from skilled PT to strengthen periscapular musculature to decrease abborant scapular movement to improve GHJ motion.    PT Next Visit Plan prone rib/throacic mobs, periscapular stability   PT Home Exercise Plan Cane exercises: flexion, extension, abduction,  ER stretch at doorway   Consulted and Agree with Plan of Care Patient      Patient will benefit from skilled therapeutic intervention in order to improve the following deficits and impairments:     Visit Diagnosis: Stiffness of right shoulder, not elsewhere classified  Chronic right shoulder pain  Muscle weakness (generalized)     Problem List Patient Active Problem List   Diagnosis Date Noted  . Adhesive capsulitis of right shoulder associated with type 2 diabetes mellitus ([place]) 09/02/2016  . Esophageal stricture   . Dysphagia 01/23/2016  . IDDM (insulin dependent diabetes mellitus) ([place]) 01/23/2016  . Controlled type 2 diabetes mellitus with diabetic peripheral angiopathy without gangrene, with long-term current use of insulin ([place]) 10/30/2015  . Fever 09/01/2015  . Diabetes mellitus due to underlying condition, uncontrolled, with circulatory complication ([place])   . Chronic diastolic heart failure ([place])   . Anemia requiring transfusions   . Hx pulmonary embolism   . Elevated troponin   . GI bleed 08/18/2015  . Symptomatic anemia 08/18/2015  . Pulmonary embolism ([place]) 08/18/2015  . HLD (hyperlipidemia) 08/18/2015  . GIB (gastrointestinal bleeding) 08/18/2015  . Gastrointestinal hemorrhage with melena   . Esophageal cancer ([place]) 07/25/2015  . LUQ abdominal pain 07/07/2015  . 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 Chest pain  12/02/2014  . Quit smoking within past year 11/04/2013  . Screening for prostate cancer 11/04/2013  . Encounter for long-term (current) use of other medications 11/04/2013  . Routine general medical examination at a health care facility 11/08/2012  . Gastroenteritis 09/10/2011  . PROTEINURIA, MILD 08/15/2009  . ANXIETY STATE, UNSPECIFIED 12/15/2008  . PULMONARY NODULE 11/24/2008  . EMPHYSEMA 11/08/2008  . COLONIC POLYPS, ADENOMATOUS 08/01/2008  . UNSPECIFIED INFLAMMATORY AND TOXIC NEUROPATHY 05/19/2008  . CHEST PAIN, PLEURITIC 05/19/2008  . HYPERCHOLESTEROLEMIA 02/03/2008  . Depression with anxiety 03/13/2007  . Essential hypertension 03/13/2007  . GERD 03/13/2007    Teena Mangus C. Tiann Saha PT, DPT 09/19/16 1:46 PM   [facility] [address] [place], [place], [address] Phone: [phone]   Fax:  [phone]  Name: Ronald Johnson MRN: [id number] Date of Birth: 1946/02/04

## 2016-09-24 ENCOUNTER — Ambulatory Visit: Payer: Medicare Other | Admitting: Physical Therapy

## 2016-09-24 DIAGNOSIS — G8929 Other chronic pain: Secondary | ICD-10-CM

## 2016-09-24 DIAGNOSIS — M25611 Stiffness of right shoulder, not elsewhere classified: Secondary | ICD-10-CM

## 2016-09-24 DIAGNOSIS — M25511 Pain in right shoulder: Secondary | ICD-10-CM | POA: Diagnosis not present

## 2016-09-24 DIAGNOSIS — M6281 Muscle weakness (generalized): Secondary | ICD-10-CM | POA: Diagnosis not present

## 2016-09-24 NOTE — Therapy (Signed)
[place], [place], [address] Phone: [phone]   Fax:  [phone]  Physical Therapy Treatment  Patient Details  Name: Ronald Johnson MRN: [id number] Date of Birth: 1946/03/26 Referring Provider: Teresa Coombs, MD  Encounter Date: 09/24/2016      PT End of Session - 09/24/16 1051    Visit Number 5   Number of Visits 16   Date for PT Re-Evaluation 11/08/16   PT Start Time 0936   PT Stop Time 1030   PT Time Calculation (min) 54 min   Activity Tolerance Patient tolerated treatment well   Behavior During Therapy [facility] for tasks assessed/performed      Past Medical History:  Diagnosis Date  . Anemia 08/15/2009  . Anxiety   . Arthritis   . [name]'s esophagus 2007  . COLONIC POLYPS, ADENOMATOUS 08/01/2008, 2013, 2014  . COPD (chronic obstructive pulmonary disease) (HCC)    no per pt  . Depression   . Diabetes type 2, controlled ([place]) 03/13/2007  . Diverticulitis   . ED (erectile dysfunction)   . EMPHYSEMA 11/08/2008   no per pt  . Esophageal cancer ([place]) "dx'd ~ 01/2015"  . GERD 03/13/2007  . GIB (gastrointestinal bleeding) 08/18/2015  . Hiatal hernia   . History of blood transfusion 08/18/2015   due to GIB  . HYPERCHOLESTEROLEMIA 02/03/2008  . HYPERTENSION 03/13/2007   pt denies, claims white coat syndrome  . Prostatism   . PULMONARY NODULE 11/24/2008  . Stomach cancer ([place]) "dx'd ~ 01/2015"  . TOBACCO ABUSE 11/24/2008    Past Surgical History:  Procedure Laterality Date  . BALLOON DILATION N/A 03/19/2016   Procedure: BALLOON DILATION;  Surgeon: Irene Shipper, MD;  [facility];  Service: Endoscopy;  Laterality: N/A;  . CIRCUMCISION    . COLONOSCOPY    . COMPLETE ESOPHAGECTOMY N/A 07/25/2015   Procedure: TRANSHIATAL TOTAL ESOPHAGECTOMY COMPLETE PYLOROMYOTOMY;  Surgeon: [name], MD;  Location: [place];  Service: Thoracic;  Laterality: N/A;  . EGD  11/19/2005  .  ESOPHAGOGASTRODUODENOSCOPY N/A 03/19/2016   Procedure: ESOPHAGOGASTRODUODENOSCOPY (EGD);  Surgeon: Irene Shipper, MD;  [facility];  Service: Endoscopy;  Laterality: N/A;  . EUS N/A 03/16/2015   Procedure: UPPER ENDOSCOPIC ULTRASOUND (EUS) RADIAL;  Surgeon: Milus Banister, MD;  [facility];  Service: Endoscopy;  Laterality: N/A;  . FOOT FRACTURE SURGERY Right 1980   right foot w/ pins and screws  . JEJUNOSTOMY N/A 07/25/2015   Procedure: FEEDING JEJUNOSTOMY;  Surgeon: [name], MD;  Location: [facility];  Service: Thoracic;  Laterality: N/A;  . POLYPECTOMY    . removal pins and screws foot  1980   right foot  . UPPER GASTROINTESTINAL ENDOSCOPY    . VASECTOMY    . VIDEO BRONCHOSCOPY N/A 07/25/2015   Procedure: VIDEO BRONCHOSCOPY;  Surgeon: [name], MD;  Location[facility] OR;  Service: Thoracic;  Laterality: N/A;    There were no vitals filed for this visit.      Subjective Assessment - 09/24/16 1054    Subjective I don't feel well.  Since my surgery i get stuff bubbling up in my mouth, and I get a bad taste.   I can get my jacket on easier,Ican use my arm easier.   Currently in Pain? Yes   Pain Score 4    Pain Location Shoulder   Pain Orientation Right   Pain Descriptors / Indicators Dull   Aggravating Factors  raching  overhead and outr   Pain Relieving Factors resting, Massage   Effect of Pain on Daily Activities reaching activities            [facility] PT Assessment - 09/24/16 0001      AROM   Right Shoulder Flexion 150 Degrees  supine AROM, after manual                     [facility] Adult PT Treatment/Exercise - 09/24/16 0001      Shoulder Exercises: Supine   External Rotation 5 reps  2 sets,at side and 45 degrees, painful end range  sm ROM   Flexion Limitations AAROM 10 x   ABduction Limitations AAROM 5X   Other Supine Exercises serratus punches 5 x     Shoulder Exercises: Stretch   Corner Stretch Limitations supine pec stretch  10 seconds X 5 arms partially supported on narrow table      Manual Therapy   Joint Mobilization Grade 3 inferior glides, posterior shoulder    Soft tissue mobilization fascia, proximal center scar chest   Passive ROM flexion, external rotaiton, abductiom                  PT Short Term Goals - 09/24/16 1236      PT SHORT TERM GOAL #1   Title Pt will be independent with his HEP.    Baseline independent with thosee issued so far   Time 4   Period Weeks   Status On-going     PT SHORT TERM GOAL #2   Title Pt will be able to increase R shoulder flexion to >/=150 degrees in order to improve functional mobility.    Baseline supine AROM after manual 150   Time 4   Period Weeks   Status On-going           PT Long Term Goals - 09/10/16 1030      PT LONG TERM GOAL #1   Title Pt will be able to increase his FOTO score from 60 to 75% function.    Baseline 60 % function   Time 8   Period Weeks   Status New     PT LONG TERM GOAL #2   Title Pt will be able to don his coat without difficulty and with no increased pain.    Time 8   Period Weeks   Status New     PT LONG TERM GOAL #3   Title Pt will improve R shoulder flexion to 160 degress in order to improve functional mobility.    Time 8   Period Weeks   Status New     PT LONG TERM GOAL #4   Title Pt will report no pain with over head reaching for ADL's   Time 8   Period Weeks   Status New     PT LONG TERM GOAL #5   Title Pt will improve R shoulder strength to 5/5 to improve abilty to perform leisure activities/household chores.    Time 8   Period Weeks   Status New               Plan - 09/24/16 1233    Clinical Impression Statement Function increasing, pain decreasing.  Supine AROM 150.  Manual and stretching is main focus of session. Progress toward LTG #2 and ROM goals.   PT Next Visit Plan prone rib/throacic mobs, periscapular stability,  Return to more active exercise if patient is feeling  better.  PT Home Exercise Plan Cane exercises: flexion, extension, abduction,  ER stretch at doorway   Consulted and Agree with Plan of Care Patient      Patient will benefit from skilled therapeutic intervention in order to improve the following deficits and impairments:  Pain, Postural dysfunction, Impaired perceived functional ability, Decreased range of motion, Impaired UE functional use, Decreased strength, Decreased mobility  Visit Diagnosis: Stiffness of right shoulder, not elsewhere classified  Chronic right shoulder pain  Muscle weakness (generalized)     Problem List Patient Active Problem List   Diagnosis Date Noted  . Adhesive capsulitis of right shoulder associated with type 2 diabetes mellitus ([place]) 09/02/2016  . Esophageal stricture   . Dysphagia 01/23/2016  . IDDM (insulin dependent diabetes mellitus) ([place]) 01/23/2016  . Controlled type 2 diabetes mellitus with diabetic peripheral angiopathy without gangrene, with long-term current use of insulin ([place]) 10/30/2015  . Fever 09/01/2015  . Diabetes mellitus due to underlying condition, uncontrolled, with circulatory complication ([place])   . Chronic diastolic heart failure ([place])   . Anemia requiring transfusions   . Hx pulmonary embolism   . Elevated troponin   . GI bleed 08/18/2015  . Symptomatic anemia 08/18/2015  . Pulmonary embolism ([place]) 08/18/2015  . HLD (hyperlipidemia) 08/18/2015  . GIB (gastrointestinal bleeding) 08/18/2015  . Gastrointestinal hemorrhage with melena   . Esophageal cancer ([place]) 07/25/2015  . LUQ abdominal pain 07/07/2015  . 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 Chest pain 12/02/2014  . Quit smoking within past year 11/04/2013  . Screening for prostate cancer 11/04/2013  . Encounter for long-term (current) use of other medications 11/04/2013  . Routine general medical examination at a health care facility 11/08/2012  . Gastroenteritis 09/10/2011  . PROTEINURIA, MILD 08/15/2009  .  ANXIETY STATE, UNSPECIFIED 12/15/2008  . PULMONARY NODULE 11/24/2008  . EMPHYSEMA 11/08/2008  . COLONIC POLYPS, ADENOMATOUS 08/01/2008  . UNSPECIFIED INFLAMMATORY AND TOXIC NEUROPATHY 05/19/2008  . CHEST PAIN, PLEURITIC 05/19/2008  . HYPERCHOLESTEROLEMIA 02/03/2008  . Depression with anxiety 03/13/2007  . Essential hypertension 03/13/2007  . GERD 03/13/2007    HARRIS,KAREN PTA 09/24/2016, 12:38 PM  [facility] [address] [place], [place], [address] Phone: [phone]   Fax:  [phone]  Name: Ronald Johnson MRN: [id number] Date of Birth: 07-Jan-1946

## 2016-09-26 ENCOUNTER — Ambulatory Visit: Payer: Medicare Other | Admitting: Physical Therapy

## 2016-09-26 ENCOUNTER — Encounter: Payer: Self-pay | Admitting: Physical Therapy

## 2016-09-26 DIAGNOSIS — G8929 Other chronic pain: Secondary | ICD-10-CM | POA: Diagnosis not present

## 2016-09-26 DIAGNOSIS — M25611 Stiffness of right shoulder, not elsewhere classified: Secondary | ICD-10-CM

## 2016-09-26 DIAGNOSIS — M6281 Muscle weakness (generalized): Secondary | ICD-10-CM | POA: Diagnosis not present

## 2016-09-26 DIAGNOSIS — M25511 Pain in right shoulder: Secondary | ICD-10-CM | POA: Diagnosis not present

## 2016-09-26 NOTE — Therapy (Signed)
[place], [place], [address] Phone: [phone]   Fax:  [phone]  Physical Therapy Treatment  Patient Details  Name: Ronald Johnson MRN: [id number] Date of Birth: 11-01-1945 Referring Provider: Teresa Coombs, MD  Encounter Date: 09/26/2016      PT End of Session - 09/26/16 0849    Visit Number 6   Number of Visits 16   Authorization Type KX modifier after 15 visits   PT Start Time 0849   PT Stop Time 0927   PT Time Calculation (min) 38 min   Activity Tolerance Patient tolerated treatment well   Behavior During Therapy [facility] for tasks assessed/performed      Past Medical History:  Diagnosis Date  . Anemia 08/15/2009  . Anxiety   . Arthritis   . [name]'s esophagus 2007  . COLONIC POLYPS, ADENOMATOUS 08/01/2008, 2013, 2014  . COPD (chronic obstructive pulmonary disease) (HCC)    no per pt  . Depression   . Diabetes type 2, controlled ([place]) 03/13/2007  . Diverticulitis   . ED (erectile dysfunction)   . EMPHYSEMA 11/08/2008   no per pt  . Esophageal cancer ([place]) "dx'd ~ 01/2015"  . GERD 03/13/2007  . GIB (gastrointestinal bleeding) 08/18/2015  . Hiatal hernia   . History of blood transfusion 08/18/2015   due to GIB  . HYPERCHOLESTEROLEMIA 02/03/2008  . HYPERTENSION 03/13/2007   pt denies, claims white coat syndrome  . Prostatism   . PULMONARY NODULE 11/24/2008  . Stomach cancer ([place]) "dx'd ~ 01/2015"  . TOBACCO ABUSE 11/24/2008    Past Surgical History:  Procedure Laterality Date  . BALLOON DILATION N/A 03/19/2016   Procedure: BALLOON DILATION;  Surgeon: Irene Shipper, MD;  [facility];  Service: Endoscopy;  Laterality: N/A;  . CIRCUMCISION    . COLONOSCOPY    . COMPLETE ESOPHAGECTOMY N/A 07/25/2015   Procedure: TRANSHIATAL TOTAL ESOPHAGECTOMY COMPLETE PYLOROMYOTOMY;  Surgeon: [name], MD;  Location: [place];  Service: Thoracic;  Laterality: N/A;  . EGD  11/19/2005  .  ESOPHAGOGASTRODUODENOSCOPY N/A 03/19/2016   Procedure: ESOPHAGOGASTRODUODENOSCOPY (EGD);  Surgeon: Irene Shipper, MD;  [facility];  Service: Endoscopy;  Laterality: N/A;  . EUS N/A 03/16/2015   Procedure: UPPER ENDOSCOPIC ULTRASOUND (EUS) RADIAL;  Surgeon: Milus Banister, MD;  [facility];  Service: Endoscopy;  Laterality: N/A;  . FOOT FRACTURE SURGERY Right 1980   right foot w/ pins and screws  . JEJUNOSTOMY N/A 07/25/2015   Procedure: FEEDING JEJUNOSTOMY;  Surgeon: [name], MD;  Location: [facility];  Service: Thoracic;  Laterality: N/A;  . POLYPECTOMY    . removal pins and screws foot  1980   right foot  . UPPER GASTROINTESTINAL ENDOSCOPY    . VASECTOMY    . VIDEO BRONCHOSCOPY N/A 07/25/2015   Procedure: VIDEO BRONCHOSCOPY;  Surgeon: [name], MD;  [facility];  Service: Thoracic;  Laterality: N/A;    There were no vitals filed for this visit.      Subjective Assessment - 09/26/16 0850    Subjective Shoulder is feeling okay. Denies cervical pain. Discomfort into lateral R upper arm to elbow.    Currently in Pain? Yes   Pain Score 2    Pain Location Arm   Pain Orientation Right;Upper   Pain Descriptors / Indicators Dull   Pain Frequency Intermittent  [facility] Adult PT Treatment/Exercise - 09/26/16 0001      Shoulder Exercises: Supine   Horizontal ABduction Limitations green tband X's   External Rotation 20 reps   Theraband Level (Shoulder External Rotation) Level 3 (Green)     Shoulder Exercises: Prone   Flexion 15 reps  standing rested on table   Extension 15 reps  standing L hand on table   Horizontal ABduction 1 20 reps  standing L hand on table   Horizontal ABduction 1 Limitations palms down     Shoulder Exercises: Pulleys   Flexion 3 minutes     Shoulder Exercises: ROM/Strengthening   UBE (Upper Arm Bike) L1.5 retro 3 min, fwd 3;     Manual Therapy   Joint Mobilization gr 4 inf mob at  90 abd   Soft tissue mobilization roller R lateral arm   Passive ROM flexion, abduction, subscap stretching                PT Education - 09/26/16 0859    Education provided Yes   Education Details exercise form/rationale, HEP, POC   Person(s) Educated Patient   Methods Explanation;Demonstration;Tactile cues;Verbal cues   Comprehension Verbalized understanding;Returned demonstration;Verbal cues required;Tactile cues required;Need further instruction          PT Short Term Goals - 09/24/16 1236      PT SHORT TERM GOAL #1   Title Pt will be independent with his HEP.    Baseline independent with thosee issued so far   Time 4   Period Weeks   Status On-going     PT SHORT TERM GOAL #2   Title Pt will be able to increase R shoulder flexion to >/=150 degrees in order to improve functional mobility.    Baseline supine AROM after manual 150   Time 4   Period Weeks   Status On-going           PT Long Term Goals - 09/10/16 1030      PT LONG TERM GOAL #1   Title Pt will be able to increase his FOTO score from 60 to 75% function.    Baseline 60 % function   Time 8   Period Weeks   Status New     PT LONG TERM GOAL #2   Title Pt will be able to don his coat without difficulty and with no increased pain.    Time 8   Period Weeks   Status New     PT LONG TERM GOAL #3   Title Pt will improve R shoulder flexion to 160 degress in order to improve functional mobility.    Time 8   Period Weeks   Status New     PT LONG TERM GOAL #4   Title Pt will report no pain with over head reaching for ADL's   Time 8   Period Weeks   Status New     PT LONG TERM GOAL #5   Title Pt will improve R shoulder strength to 5/5 to improve abilty to perform leisure activities/household chores.    Time 8   Period Weeks   Status New               Plan - 09/26/16 [id number]    Clinical Impression Statement Pt unable to tolerate prone positioning so performed those exercises in  standing with L hand on table for bent position. Pt with soreness following treatment at lateral arm as expected following rolling. Pt is making good  progress toward goals and will be reevaluated next week.    PT Next Visit Plan review goals for possible d/c 2/15   PT Home Exercise Plan Cane exercises: flexion, extension, abduction,  ER stretch at doorway   Consulted and Agree with Plan of Care Patient      Patient will benefit from skilled therapeutic intervention in order to improve the following deficits and impairments:     Visit Diagnosis: Stiffness of right shoulder, not elsewhere classified  Chronic right shoulder pain  Muscle weakness (generalized)     Problem List Patient Active Problem List   Diagnosis Date Noted  . Adhesive capsulitis of right shoulder associated with type 2 diabetes mellitus ([place]) 09/02/2016  . Esophageal stricture   . Dysphagia 01/23/2016  . IDDM (insulin dependent diabetes mellitus) ([place]) 01/23/2016  . Controlled type 2 diabetes mellitus with diabetic peripheral angiopathy without gangrene, with long-term current use of insulin ([place]) 10/30/2015  . Fever 09/01/2015  . Diabetes mellitus due to underlying condition, uncontrolled, with circulatory complication ([place])   . Chronic diastolic heart failure ([place])   . Anemia requiring transfusions   . Hx pulmonary embolism   . Elevated troponin   . GI bleed 08/18/2015  . Symptomatic anemia 08/18/2015  . Pulmonary embolism ([place]) 08/18/2015  . HLD (hyperlipidemia) 08/18/2015  . GIB (gastrointestinal bleeding) 08/18/2015  . Gastrointestinal hemorrhage with melena   . Esophageal cancer ([place]) 07/25/2015  . LUQ abdominal pain 07/07/2015  . 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 Chest pain 12/02/2014  . Quit smoking within past year 11/04/2013  . Screening for prostate cancer 11/04/2013  . Encounter for long-term (current) use of other medications 11/04/2013  . Routine general medical examination at a  health care facility 11/08/2012  . Gastroenteritis 09/10/2011  . PROTEINURIA, MILD 08/15/2009  . ANXIETY STATE, UNSPECIFIED 12/15/2008  . PULMONARY NODULE 11/24/2008  . EMPHYSEMA 11/08/2008  . COLONIC POLYPS, ADENOMATOUS 08/01/2008  . UNSPECIFIED INFLAMMATORY AND TOXIC NEUROPATHY 05/19/2008  . CHEST PAIN, PLEURITIC 05/19/2008  . HYPERCHOLESTEROLEMIA 02/03/2008  . Depression with anxiety 03/13/2007  . Essential hypertension 03/13/2007  . GERD 03/13/2007    Naketa Daddario C. Ziggy Chanthavong PT, DPT 09/26/16 10:20 AM   [facility] [address] [place], [place], [address] Phone: [phone]   Fax:  [phone]  Name: Ronald Johnson MRN: [id number] Date of Birth: [birth year]

## 2016-10-01 ENCOUNTER — Encounter: Payer: Self-pay | Admitting: Physical Therapy

## 2016-10-01 ENCOUNTER — Ambulatory Visit: Payer: Medicare Other | Admitting: Physical Therapy

## 2016-10-01 DIAGNOSIS — M25611 Stiffness of right shoulder, not elsewhere classified: Secondary | ICD-10-CM

## 2016-10-01 DIAGNOSIS — G8929 Other chronic pain: Secondary | ICD-10-CM | POA: Diagnosis not present

## 2016-10-01 DIAGNOSIS — M25511 Pain in right shoulder: Secondary | ICD-10-CM

## 2016-10-01 DIAGNOSIS — M6281 Muscle weakness (generalized): Secondary | ICD-10-CM

## 2016-10-01 NOTE — Therapy (Signed)
[place], [place], [address] Phone: [phone]   Fax:  [phone]  Physical Therapy Treatment/Discharge summary  Patient Details  Name: Ronald Johnson MRN: [id number] Date of Birth: 11-10-1945 Referring Provider: Teresa Coombs, MD  Encounter Date: 10/01/2016      PT End of Session - 10/01/16 0930    Visit Number 7   Number of Visits 16   Date for PT Re-Evaluation 11/08/16   Authorization Type KX modifier after 15 visits   PT Start Time 0931   PT Stop Time 1008   PT Time Calculation (min) 37 min   Activity Tolerance Patient tolerated treatment well   Behavior During Therapy [facility] for tasks assessed/performed      Past Medical History:  Diagnosis Date  . Anemia 08/15/2009  . Anxiety   . Arthritis   . [name]'s esophagus 2007  . COLONIC POLYPS, ADENOMATOUS 08/01/2008, 2013, 2014  . COPD (chronic obstructive pulmonary disease) (HCC)    no per pt  . Depression   . Diabetes type 2, controlled ([place]) 03/13/2007  . Diverticulitis   . ED (erectile dysfunction)   . EMPHYSEMA 11/08/2008   no per pt  . Esophageal cancer ([place]) "dx'd ~ 01/2015"  . GERD 03/13/2007  . GIB (gastrointestinal bleeding) 08/18/2015  . Hiatal hernia   . History of blood transfusion 08/18/2015   due to GIB  . HYPERCHOLESTEROLEMIA 02/03/2008  . HYPERTENSION 03/13/2007   pt denies, claims white coat syndrome  . Prostatism   . PULMONARY NODULE 11/24/2008  . Stomach cancer ([place]) "dx'd ~ 01/2015"  . TOBACCO ABUSE 11/24/2008    Past Surgical History:  Procedure Laterality Date  . BALLOON DILATION N/A 03/19/2016   Procedure: BALLOON DILATION;  Surgeon: Irene Shipper, MD;  [facility];  Service: Endoscopy;  Laterality: N/A;  . CIRCUMCISION    . COLONOSCOPY    . COMPLETE ESOPHAGECTOMY N/A 07/25/2015   Procedure: TRANSHIATAL TOTAL ESOPHAGECTOMY COMPLETE PYLOROMYOTOMY;  Surgeon: [name], MD;  Location: [facility];  Service:  Thoracic;  Laterality: N/A;  . EGD  11/19/2005  . ESOPHAGOGASTRODUODENOSCOPY N/A 03/19/2016   Procedure: ESOPHAGOGASTRODUODENOSCOPY (EGD);  Surgeon: Irene Shipper, MD;  [facility];  Service: Endoscopy;  Laterality: N/A;  . EUS N/A 03/16/2015   Procedure: UPPER ENDOSCOPIC ULTRASOUND (EUS) RADIAL;  Surgeon: Milus Banister, MD;  [facility];  Service: Endoscopy;  Laterality: N/A;  . FOOT FRACTURE SURGERY Right 1980   right foot w/ pins and screws  . JEJUNOSTOMY N/A 07/25/2015   Procedure: FEEDING JEJUNOSTOMY;  Surgeon: [name], MD;  Location: [facility];  Service: Thoracic;  Laterality: N/A;  . POLYPECTOMY    . removal pins and screws foot  1980   right foot  . UPPER GASTROINTESTINAL ENDOSCOPY    . VASECTOMY    . VIDEO BRONCHOSCOPY N/A 07/25/2015   Procedure: VIDEO BRONCHOSCOPY;  Surgeon: [name], MD;  Location: [facility] OR;  Service: Thoracic;  Laterality: N/A;    There were no vitals filed for this visit.      Subjective Assessment - 10/01/16 0930    Subjective Shoulder is doing fine.    Currently in Pain? No/denies            [facility] PT Assessment - 10/01/16 0001      Assessment   Medical Diagnosis R frozen shoulder   Referring Provider Teresa Coombs, MD     Observation/Other Assessments   Focus on Therapeutic Outcomes (  FOTO)  90% ablility     AROM   Right Shoulder Extension 40 Degrees   Right Shoulder Flexion 130 Degrees   Right Shoulder ABduction 100 Degrees   Right Shoulder Internal Rotation --  superior sacrum     Strength   Right Shoulder Flexion 5/5   Right Shoulder Extension 5/5   Right Shoulder ABduction 5/5   Right Shoulder Internal Rotation 5/5   Right Shoulder External Rotation 5/5                     [facility] Adult PT Treatment/Exercise - 10/01/16 0001      Shoulder Exercises: Supine   Protraction Limitations serratus punch with red tband   Horizontal ABduction Limitations red tband, with diagonals     Shoulder  Exercises: Standing   Other Standing Exercises standing flexion, horiz abd, ext propped L arm on table     Shoulder Exercises: ROM/Strengthening   UBE (Upper Arm Bike) L1.5 retro 3 min, fwd 3;     Shoulder Exercises: Stretch   Internal Rotation Stretch Limitations sleeper stretch   Table Stretch -Flexion Limitations supine with wand                PT Education - 10/01/16 0934    Education provided Yes   Education Details exercise form/rationale, progress, HP   Person(s) Educated Patient   Methods Explanation;Demonstration;Tactile cues;Verbal cues   Comprehension Verbalized understanding;Returned demonstration;Verbal cues required;Tactile cues required;Need further instruction          PT Short Term Goals - 10/01/16 1015      PT SHORT TERM GOAL #1   Title Pt will be independent with his HEP.    Baseline independent   Status Achieved     PT SHORT TERM GOAL #2   Title Pt will be able to increase R shoulder flexion to >/=150 degrees in order to improve functional mobility.    Baseline see flowsheet   Status Not Met           PT Long Term Goals - 10/01/16 0935      PT LONG TERM GOAL #1   Title Pt will be able to increase his FOTO score from 60 to 75% function.    Baseline 90%   Status Achieved     PT LONG TERM GOAL #2   Title Pt will be able to don his coat without difficulty and with no increased pain.    Baseline able without difficulty or pain   Status Achieved     PT LONG TERM GOAL #3   Title Pt will improve R shoulder flexion to 160 degress in order to improve functional mobility.    Baseline see flowsheet   Status Not Met     PT LONG TERM GOAL #4   Title Pt will report no pain with over head reaching for ADL's   Baseline denies pain   Status Achieved     PT LONG TERM GOAL #5   Title Pt will improve R shoulder strength to 5/5 to improve abilty to perform leisure activities/household chores.    Baseline see flowhseet   Status Achieved                Plan - 10/01/16 1014    Clinical Impression Statement Pt has met most of his goals and reports full ability to complete daily activities without limitation by shoulder. Stressed importance of continued stretching and exercise. Pt has been d/c at this time to independent  program and was instructed to contact us with any questions.    Consulted and Agree with Plan of Care Patient      Patient will benefit from skilled therapeutic intervention in order to improve the following deficits and impairments:     Visit Diagnosis: Stiffness of right shoulder, not elsewhere classified  Chronic right shoulder pain  Muscle weakness (generalized)     Problem List Patient Active Problem List   Diagnosis Date Noted  . Adhesive capsulitis of right shoulder associated with type 2 diabetes mellitus ([place]) 09/02/2016  . Esophageal stricture   . Dysphagia 01/23/2016  . IDDM (insulin dependent diabetes mellitus) ([place]) 01/23/2016  . Controlled type 2 diabetes mellitus with diabetic peripheral angiopathy without gangrene, with long-term current use of insulin ([place]) 10/30/2015  . Fever 09/01/2015  . Diabetes mellitus due to underlying condition, uncontrolled, with circulatory complication ([place])   . Chronic diastolic heart failure ([place])   . Anemia requiring transfusions   . Hx pulmonary embolism   . Elevated troponin   . GI bleed 08/18/2015  . Symptomatic anemia 08/18/2015  . Pulmonary embolism ([place]) 08/18/2015  . HLD (hyperlipidemia) 08/18/2015  . GIB (gastrointestinal bleeding) 08/18/2015  . Gastrointestinal hemorrhage with melena   . Esophageal cancer ([place]) 07/25/2015  . LUQ abdominal pain 07/07/2015  . 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 Chest pain 12/02/2014  . Quit smoking within past year 11/04/2013  . Screening for prostate cancer 11/04/2013  . Encounter for long-term (current) use of other medications 11/04/2013  . Routine general medical examination at a health  care facility 11/08/2012  . Gastroenteritis 09/10/2011  . PROTEINURIA, MILD 08/15/2009  . ANXIETY STATE, UNSPECIFIED 12/15/2008  . PULMONARY NODULE 11/24/2008  . EMPHYSEMA 11/08/2008  . COLONIC POLYPS, ADENOMATOUS 08/01/2008  . UNSPECIFIED INFLAMMATORY AND TOXIC NEUROPATHY 05/19/2008  . CHEST PAIN, PLEURITIC 05/19/2008  . HYPERCHOLESTEROLEMIA 02/03/2008  . Depression with anxiety 03/13/2007  . Essential hypertension 03/13/2007  . GERD 03/13/2007   PHYSICAL THERAPY DISCHARGE SUMMARY  Visits from Start of Care: 7  Current functional level related to goals / functional outcomes: See above   Remaining deficits: See above   Education / Equipment: Anatomy of condition, POC, HEP, exercise form/rationale  Plan: Patient agrees to discharge.  Patient goals were partially met. Patient is being discharged due to being pleased with the current functional level.  ?????    Kayda Allers C. Madolyn Ackroyd PT, DPT 10/01/16 10:24 AM    [facility] [address] [place], [place], [address] Phone: [phone]   Fax:  [phone]  Name: Ronald Johnson MRN: [id number] Date of Birth: October 20, 1945

## 2016-10-02 ENCOUNTER — Ambulatory Visit (INDEPENDENT_AMBULATORY_CARE_PROVIDER_SITE_OTHER): Payer: Medicare Other | Admitting: Physician Assistant

## 2016-10-02 ENCOUNTER — Encounter: Payer: Self-pay | Admitting: Physician Assistant

## 2016-10-02 VITALS — BP 124/66 | HR 70 | Ht 76.0 in | Wt 235.0 lb

## 2016-10-02 DIAGNOSIS — R1084 Generalized abdominal pain: Secondary | ICD-10-CM | POA: Diagnosis not present

## 2016-10-02 DIAGNOSIS — R110 Nausea: Secondary | ICD-10-CM | POA: Diagnosis not present

## 2016-10-02 DIAGNOSIS — K5900 Constipation, unspecified: Secondary | ICD-10-CM | POA: Diagnosis not present

## 2016-10-02 MED ORDER — ONDANSETRON 8 MG PO TBDP: 8.0000 mg | ORAL_TABLET | Freq: Three times a day (TID) | ORAL | 5 refills | Status: DC

## 2016-10-02 NOTE — Progress Notes (Signed)
Gallbladder disease would be another thought as this may explain both nausea and pain. Might consider gallbladder ultrasound if CT negative

## 2016-10-02 NOTE — Progress Notes (Signed)
Case reviewed with physician assistant. Troubling nausea persists. Fortunately, no vomiting, recurrent dysphagia, or weight loss. I do agree with CT scan imaging evaluate his abdominal pain

## 2016-10-02 NOTE — Patient Instructions (Signed)
You have been scheduled for a CT scan of the abdomen and pelvis at [facility] ([address]---this is in the same building as Press [facility]).   You are scheduled on 10/09/16 at 9:30 am. You should arrive 15 minutes prior to your appointment time for registration. Please follow the written instructions below on the day of your exam:  WARNING: IF YOU ARE ALLERGIC TO IODINE/X-RAY DYE, PLEASE NOTIFY RADIOLOGY IMMEDIATELY AT [phone]! YOU WILL BE GIVEN A 13 HOUR PREMEDICATION PREP.  1) Do not eat or drink anything after 5:30 am (4 hours prior to your test) 2) You have been given 2 bottles of oral contrast to drink. The solution may taste               better if refrigerated, but do NOT add ice or any other liquid to this solution. Shake             well before drinking.    Drink 1 bottle of contrast @ 7:30 am (2 hours prior to your exam)  Drink 1 bottle of contrast @ 8:30 am (1 hour prior to your exam)  You may take any medications as prescribed with a small amount of water except for the following: Metformin, Glucophage, Glucovance, Avandamet, Riomet, Fortamet, Actoplus Met, Janumet, Glumetza or Metaglip. The above medications must be held the day of the exam AND 48 hours after the exam.  The purpose of you drinking the oral contrast is to aid in the visualization of your intestinal tract. The contrast solution may cause some diarrhea. Before your exam is started, you will be given a small amount of fluid to drink. Depending on your individual set of symptoms, you may also receive an intravenous injection of x-ray contrast/dye. Plan on being at [facility] for 30 minutes or longer, depending on the type of exam you are having performed.  This test typically takes 30-45 minutes to complete.  If you have any questions regarding your exam or if you need to reschedule, you may call the CT department at [phone] between the hours of 8:00 am and 5:00 pm,  Monday-Friday.  ________________________________________________________________________  We have referred you to a dietician they will contact you.  We have sent the following medications to your pharmacy for you to pick up at your convenience: Zofran 8 mg 20-30 mins before meals and at bedtime.   Continue Omeprazole 40 mg twice a day.  Start Linzess 290 mg daily. We have given you samples.

## 2016-10-02 NOTE — Progress Notes (Signed)
Chief Complaint: Nausea, epigastric pain  HPI:  Mr. Katen is a 71 year old Caucasian male with a past medical history of  multiple medical problems including insulin-requiring diabetes, COPD and esophageal cancer for which he is status post neoadjuvant radiation and chemotherapy followed by esophagectomy December 2016, who returns to clinic today for a continued complaint of nausea.   Patient's last EGD was completed 03/19/16 with findings of an anastomotic stricture which was dilated to a maximal diameter of 18 mm.   Please recall patient follows with Dr. Henrene Pastor. He was last seen on 07/24/16 for this same complaint. At that time it was discussed that this was chronic postprandial nausea as a result of altered anatomy and vagus nerve sacrifice, patient was prescribed Zofran 4 mg and instructed to take this 3 times a day as needed and was continued on a twice a day PPI. It was recommended we reconsider surveillance colonoscopy in the future.   Today, the patient returns to clinic and continues to tell me that he has nausea after the first bite of food every time he eats. He tells me that once he eats he will develop nausea and this will last at least 30-60 minutes. He has tried using up to 4 Zofran once he becomes nauseous and this is of no help. He tells me that he will end up having to lay down in bed for about an hour or so before this passes. This is every time he eats. Over the past 3-4 months the patient has also noted an epigastric pain which occasionally accompanies this nausea. He has remained on Omeprazole 40 mg twice a day but does not know if it is "helping anymore". Patient describes his pain as a stabbing pain that is rated as an 8/10. He has also tried various over-the-counter antacids including Pepto-Bismol and Tums which did not seem to touch this pain. The patient's wife has told him that if he just eats "hardly anything", he could avoid symptoms. The patient tells me that he has been able to  do this on 2 separate occasions. He does ask if he can see a dietitian.   He also describes trouble with constipation ever since time of his stomach surgery. He tells me that he can go four days in between a bowel movement and this has not been helped with increased fiber usage or daily laxative which he has tried in the past. He has never been on anything else.   Patient denies fever, chills, blood in his stool, melena, weight loss, fatigue, anorexia, vomiting, heartburn, reflux, dysphagia or symptoms that awaken him at night.  Past Medical History:  Diagnosis Date  . Anemia 08/15/2009  . Anxiety   . Arthritis   . [name]'s esophagus 2007  . COLONIC POLYPS, ADENOMATOUS 08/01/2008, 2013, 2014  . COPD (chronic obstructive pulmonary disease) (HCC)    no per pt  . Depression   . Diabetes type 2, controlled ([place]) 03/13/2007  . Diverticulitis   . ED (erectile dysfunction)   . EMPHYSEMA 11/08/2008   no per pt  . Esophageal cancer ([place]) "dx'd ~ 01/2015"  . GERD 03/13/2007  . GIB (gastrointestinal bleeding) 08/18/2015  . Hiatal hernia   . History of blood transfusion 08/18/2015   due to GIB  . HYPERCHOLESTEROLEMIA 02/03/2008  . HYPERTENSION 03/13/2007   pt denies, claims white coat syndrome  . Prostatism   . PULMONARY NODULE 11/24/2008  . Stomach cancer ([place]) "dx'd ~ 01/2015"  . TOBACCO ABUSE 11/24/2008  Past Surgical History:  Procedure Laterality Date  . BALLOON DILATION N/A 03/19/2016   Procedure: BALLOON DILATION;  Surgeon: Irene Shipper, MD;  [facility];  Service: Endoscopy;  Laterality: N/A;  . CIRCUMCISION    . COLONOSCOPY    . COMPLETE ESOPHAGECTOMY N/A 07/25/2015   Procedure: TRANSHIATAL TOTAL ESOPHAGECTOMY COMPLETE PYLOROMYOTOMY;  Surgeon: [name], MD;  Location: [place];  Service: Thoracic;  Laterality: N/A;  . EGD  11/19/2005  . ESOPHAGOGASTRODUODENOSCOPY N/A 03/19/2016   Procedure: ESOPHAGOGASTRODUODENOSCOPY (EGD);  Surgeon: Irene Shipper, MD;  [facility];  Service: Endoscopy;  Laterality: N/A;  . EUS N/A 03/16/2015   Procedure: UPPER ENDOSCOPIC ULTRASOUND (EUS) RADIAL;  Surgeon: Milus Banister, MD;  [facility];  Service: Endoscopy;  Laterality: N/A;  . FOOT FRACTURE SURGERY Right 1980   right foot w/ pins and screws  . JEJUNOSTOMY N/A 07/25/2015   Procedure: FEEDING JEJUNOSTOMY;  Surgeon: [name], MD;  Location: [place];  Service: Thoracic;  Laterality: N/A;  . POLYPECTOMY    . removal pins and screws foot  1980   right foot  . UPPER GASTROINTESTINAL ENDOSCOPY    . VASECTOMY    . VIDEO BRONCHOSCOPY N/A 07/25/2015   Procedure: VIDEO BRONCHOSCOPY;  Surgeon: [name], MD;  [facility];  Service: Thoracic;  Laterality: N/A;    Current Outpatient Prescriptions  Medication Sig Dispense Refill  . atorvastatin (LIPITOR) 10 MG tablet Take 1 tablet by mouth  every day 90 tablet 3  . diazepam (VALIUM) 5 MG tablet Take 1 tablet (5 mg total) by mouth 2 (two) times daily as needed for anxiety. 180 tablet 1  . glucose blood (PRECISION XTRA TEST STRIPS) test strip Check blood sugar three times a day dx 250.01 270 each 3  . insulin NPH Human (HUMULIN N) 100 UNIT/ML injection Inject 2.4 mLs (240 Units total) into the skin every morning. 8 vial 11  . Insulin Syringe-Needle U-100 30G X 3/8" 1 ML MISC Use to inject insulin 3 times daily. 300 each 1  . omeprazole (PRILOSEC) 40 MG capsule Take 1 capsule by mouth  daily (Patient taking differently: Take 1 capsule by mouth  twice a day) 30 capsule 3  . sildenafil (REVATIO) 20 MG tablet TAKE 1-5 TABLETS BY MOUTH ONCE DAILY AS NEEDED 50 tablet 0   No current facility-administered medications for this visit.     Allergies as of 10/02/2016  . (No Known Allergies)    Family History  Problem Relation Age of Onset  . Stroke Mother   . Diabetes Mother   . Diabetes Maternal Grandmother     mother side of the family aunts, MGF  . Breast cancer Paternal Grandmother   .  Colon cancer Neg Hx   . Esophageal cancer Neg Hx   . Rectal cancer Neg Hx   . Stomach cancer Neg Hx     Social History   Social History  . Marital status: Married    Spouse name: N/A  . Number of children: 2  . Years of education: N/A   Occupational History  . 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 Smoking status: Former Smoker    Packs/day: 1.00    Years: 35.00    Types: Cigarettes    Quit date: 10/31/2008  . Smokeless tobacco: Never Used  . Alcohol use No  . Drug use: No  . Sexual activity: Not  Currently   Other Topics Concern  . 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 Eyes:   PEERL, EOMI. No icterus. Conjunctiva pink. Ears:  Normal auditory acuity. Neck:  Supple Throat: Oral cavity and pharynx without inflammation, swelling or lesion.  Respiratory: Respirations even and unlabored. Lungs clear to auscultation bilaterally.   No wheezes, crackles, or rhonchi.  Cardiovascular: Normal S1, S2. No MRG. Regular rate and rhythm. No peripheral edema, cyanosis or pallor.  Gastrointestinal:  Soft, nondistended, nontender. No rebound or guarding. Normal bowel sounds. No appreciable masses or hepatomegaly. Psychiatric:  Demonstrates good judgement and reason without abnormal affect or  behaviors.  No recent labs or imaging.  Assessment: 1. Nausea: Chronic for the patient ever since his surgery, thought to be the result of altered anatomy and vagus nerve sacrifice 2. Iatrogenic GERD: New complaint of epigastric pain, regardless of Omeprazole 40 mg twice a day; Consider element of delayed emptying and gastritis 3. Esophageal cancer: Status post chemoradiation as well as esophagogastrectomy 4. History of adenomatous colon polyps: Last colonoscopy in 2014 with recommendations for repeat in 3 years, this was discussed with the patient today and he declines any further surveillance procedures because "I am scared of what they will find". He does not feel like he can handle any more treatment and so does not want to have anything found. He is aware of the risks of delayed diagnosis of colon cancer.:  Plan: 1. Ordered CT abdomen and pelvis for further evaluation of abdominal pain 2. Continue Omeprazole 40 mg twice a day, 30-60 minutes before eating breakfast and dinner 3. Recommend the patient schedule his Zofran 8 mg 20-30 minutes before eating 3 times a day as well as at bed time. Prescribed # 120 with 5 refills 4. Referred patient to a dietitian per his request to discuss ways to decreased amount of food that he is eating at a time to possibly help his nausea 5. Patient did decline a colonoscopy today and any further surveillance in the future 6. Provided the patient samples of Linzess 290 mcg be taken once daily. Did discuss that if this works well for the patient we can provide him with a prescription 7. Patient to follow with Dr. Henrene Pastor in the next 2-3 months or sooner if necessary.  Ellouise Newer, PA-C [facility] 10/02/2016, 8:48 AM  Cc: [name], MD

## 2016-10-03 ENCOUNTER — Ambulatory Visit: Payer: Medicare Other | Admitting: Physical Therapy

## 2016-10-07 ENCOUNTER — Other Ambulatory Visit: Payer: Self-pay | Admitting: Emergency Medicine

## 2016-10-07 DIAGNOSIS — R1084 Generalized abdominal pain: Secondary | ICD-10-CM

## 2016-10-08 ENCOUNTER — Other Ambulatory Visit (INDEPENDENT_AMBULATORY_CARE_PROVIDER_SITE_OTHER): Payer: Medicare Other

## 2016-10-08 ENCOUNTER — Ambulatory Visit: Payer: Medicare Other | Admitting: Physical Therapy

## 2016-10-08 DIAGNOSIS — R1084 Generalized abdominal pain: Secondary | ICD-10-CM

## 2016-10-08 LAB — BASIC METABOLIC PANEL
BUN: 15 mg/dL (ref 6–23)
CHLORIDE: 101 meq/L (ref 96–112)
CO2: 32 mEq/L (ref 19–32)
Calcium: 9.6 mg/dL (ref 8.4–10.5)
Creatinine, Ser: 1.13 mg/dL (ref 0.40–1.50)
GFR: 68.5 mL/min (ref >60.00)
Glucose, Bld: 177 mg/dL — ABNORMAL HIGH (ref 70–99)
POTASSIUM: 3.8 meq/L (ref 3.5–5.1)
SODIUM: 139 meq/L (ref 135–145)

## 2016-10-09 ENCOUNTER — Ambulatory Visit (INDEPENDENT_AMBULATORY_CARE_PROVIDER_SITE_OTHER)
Admission: RE | Admit: 2016-10-09 | Discharge: 2016-10-09 | Disposition: A | Discharge status: Home / Self Care | Payer: Medicare Other | Source: Ambulatory Visit | Attending: Physician Assistant | Admitting: Physician Assistant

## 2016-10-09 DIAGNOSIS — K5900 Constipation, unspecified: Secondary | ICD-10-CM

## 2016-10-09 DIAGNOSIS — R110 Nausea: Secondary | ICD-10-CM

## 2016-10-09 DIAGNOSIS — R1084 Generalized abdominal pain: Secondary | ICD-10-CM | POA: Diagnosis not present

## 2016-10-09 MED ORDER — IOPAMIDOL (ISOVUE-300) INJECTION 61%
100.0000 mL | Freq: Once | INTRAVENOUS | Status: AC | PRN
  Administered 2016-10-09: 100 mL via INTRAVENOUS

## 2016-10-10 ENCOUNTER — Ambulatory Visit: Payer: Medicare Other | Admitting: Physical Therapy

## 2016-10-15 ENCOUNTER — Encounter: Payer: Self-pay | Admitting: Physical Therapy

## 2016-10-17 ENCOUNTER — Encounter: Payer: Self-pay | Admitting: Physical Therapy

## 2016-10-22 ENCOUNTER — Telehealth: Payer: Self-pay | Admitting: Oncology

## 2016-10-22 ENCOUNTER — Encounter: Payer: Self-pay | Admitting: Physical Therapy

## 2016-10-22 ENCOUNTER — Other Ambulatory Visit (HOSPITAL_BASED_OUTPATIENT_CLINIC_OR_DEPARTMENT_OTHER): Payer: Medicare Other

## 2016-10-22 ENCOUNTER — Ambulatory Visit (HOSPITAL_BASED_OUTPATIENT_CLINIC_OR_DEPARTMENT_OTHER): Payer: Medicare Other | Admitting: Nurse Practitioner

## 2016-10-22 VITALS — BP 138/79 | HR 87 | Temp 97.6°F | Resp 18 | Wt 234.6 lb

## 2016-10-22 DIAGNOSIS — I10 Essential (primary) hypertension: Secondary | ICD-10-CM

## 2016-10-22 DIAGNOSIS — E119 Type 2 diabetes mellitus without complications: Secondary | ICD-10-CM | POA: Diagnosis not present

## 2016-10-22 DIAGNOSIS — C159 Malignant neoplasm of esophagus, unspecified: Secondary | ICD-10-CM

## 2016-10-22 DIAGNOSIS — D696 Thrombocytopenia, unspecified: Secondary | ICD-10-CM

## 2016-10-22 DIAGNOSIS — C160 Malignant neoplasm of cardia: Secondary | ICD-10-CM | POA: Diagnosis not present

## 2016-10-22 LAB — CBC WITH DIFFERENTIAL/PLATELET
BASO%: 0.6 % (ref 0.0–2.0)
Basophils Absolute: 0 10*3/uL (ref 0.0–0.1)
EOS%: 3.9 % (ref 0.0–7.0)
Eosinophils Absolute: 0.2 10*3/uL (ref 0.0–0.5)
HCT: 36.7 % — ABNORMAL LOW (ref 38.4–49.9)
HEMOGLOBIN: 12.4 g/dL — AB (ref 13.0–17.1)
LYMPH#: 2.3 10*3/uL (ref 0.9–3.3)
LYMPH%: 37.4 % (ref 14.0–49.0)
MCH: 31.3 pg (ref 27.2–33.4)
MCHC: 33.8 g/dL (ref 32.0–36.0)
MCV: 92.7 fL (ref 79.3–98.0)
MONO#: 0.4 10*3/uL (ref 0.1–0.9)
MONO%: 5.8 % (ref 0.0–14.0)
NEUT#: 3.3 10*3/uL (ref 1.5–6.5)
NEUT%: 52.3 % (ref 39.0–75.0)
NRBC: 0 % (ref 0–0)
Platelets: 128 10*3/uL — ABNORMAL LOW (ref 140–400)
RBC: 3.96 10*6/uL — ABNORMAL LOW (ref 4.20–5.82)
RDW: 12.9 % (ref 11.0–14.6)
WBC: 6.2 10*3/uL (ref 4.0–10.3)

## 2016-10-22 NOTE — Telephone Encounter (Signed)
Gave patient AVS and calender per 10/22/2016 los

## 2016-10-22 NOTE — Progress Notes (Signed)
  [place] OFFICE PROGRESS NOTE   Diagnosis:  Esophagus cancer  INTERVAL HISTORY:   Mr. Apodaca returns as scheduled. He overall feels well. He has a good appetite. His main complaint today is intermittent nausea. This has been occurring since surgery. He relates the nausea to "overeating". He is not vomiting. He has recently developed mild dysphagia.  Objective:  Vital signs in last 24 hours:  Blood pressure 138/79, pulse 87, temperature 97.6 F (36.4 C), temperature source Oral, resp. rate 18, weight 234 lb 9.6 oz (106.4 kg), SpO2 96 %.    HEENT: Neck without mass. Lymphatics: 1 cm mobile node in the right submandibular/high cervical region. No other cervical, supraclavicular, axillary or inguinal lymph nodes. Resp: Distant breath sounds. No respiratory distress. Cardio: Regular rate and rhythm. GI: Abdomen soft and nontender. No organomegaly. Vascular: No leg edema.    Lab Results:  Lab Results  Component Value Date   WBC 6.2 10/22/2016   HGB 12.4 (L) 10/22/2016   HCT 36.7 (L) 10/22/2016   MCV 92.7 10/22/2016   PLT 128 (L) 10/22/2016   NEUTROABS 3.3 10/22/2016    Imaging:  No results found.  Medications: I have reviewed the patient's current medications.  Assessment/Plan: 1. 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 EUS 03/16/2015 confirmed a clinical stage IIb (uT3,uN1) tumor ? Staging CTs of the chest, abdomen, and pelvis with no evidence of metastatic disease ? Initiation of radiation 04/19/2015, cycle 1 Taxol/carboplatin 04/20/2015: Radiation completed 05/29/2015; the fifth and final week of Taxol/carboplatin 05/18/2015 ? Restaging CTs 07/20/2015 revealed no evidence of metastatic disease, incidental left lower lobe pulmonary embolism ? Transhiatal total esophagectomy 07/25/2015 confirmed a ypT3, ypN2 with 4/5 positive lymph nodes, lymphovascular invasion, perineural invasion, negative resection  margins  2. Postprandial subxiphoid pain secondary to #1  3. Diabetes-elevated blood sugar readings at the [facility] and at home  4. COPD  5. Depression-Improved with Wellbutrin  6. Hypertension  7. History of adenomatous colon polyps  8. History of diabetic related neuropathy, peripheral arterial disease, and nephropathy  9. Nausea-likely related to radiation esophagitis/gastritis  10. Incidental pulmonary embolism noted on the CT 07/20/2015, treated with Xarelto  11. Admission 08/18/2015 with GI bleeding while on Xarelto, anticoagulation therapy discontinued  12. Dysphagia-improved with esophageal dilatation procedures by Dr. Henrene Pastor  13. Mild thrombocytopenia-stable, we'll check blood smear for platelet clumping  14. CT scan abdomen/pelvis 10/09/2016 (done to evaluate persistent nausea, epigastric pain)-no acute findings or explanation for the patient's symptoms. Resolving postsurgical changes from presumed previous esophagectomy and gastric pull-through. No evidence of metastatic disease.   Disposition: Ronald Johnson remains in clinical remission from esophagus cancer. He will return for a follow-up visit in 6 months.  He will continue follow-up with Dr. Henrene Pastor regarding the nausea and dysphagia.  Plan reviewed with Dr. [name].    Ned Card ANP/GNP-BC   10/22/2016  12:08 PM

## 2016-10-24 ENCOUNTER — Encounter: Payer: Self-pay | Admitting: Physical Therapy

## 2016-10-28 ENCOUNTER — Ambulatory Visit (INDEPENDENT_AMBULATORY_CARE_PROVIDER_SITE_OTHER): Payer: Medicare Other | Admitting: Endocrinology

## 2016-10-28 ENCOUNTER — Encounter: Payer: Self-pay | Admitting: Endocrinology

## 2016-10-28 VITALS — BP 116/78 | HR 86 | Ht 76.0 in | Wt 237.0 lb

## 2016-10-28 DIAGNOSIS — Z125 Encounter for screening for malignant neoplasm of prostate: Secondary | ICD-10-CM

## 2016-10-28 DIAGNOSIS — IMO0001 Reserved for inherently not codable concepts without codable children: Secondary | ICD-10-CM

## 2016-10-28 DIAGNOSIS — Z794 Long term (current) use of insulin: Secondary | ICD-10-CM

## 2016-10-28 DIAGNOSIS — E119 Type 2 diabetes mellitus without complications: Secondary | ICD-10-CM | POA: Diagnosis not present

## 2016-10-28 DIAGNOSIS — N529 Male erectile dysfunction, unspecified: Secondary | ICD-10-CM | POA: Diagnosis not present

## 2016-10-28 LAB — BASIC METABOLIC PANEL
BUN: 17 mg/dL (ref 6–23)
CHLORIDE: 102 meq/L (ref 96–112)
CO2: 32 meq/L (ref 19–32)
CREATININE: 1.11 mg/dL (ref 0.40–1.50)
Calcium: 9.4 mg/dL (ref 8.4–10.5)
GFR: 69.91 mL/min (ref >60.00)
Glucose, Bld: 199 mg/dL — ABNORMAL HIGH (ref 70–99)
Potassium: 4.3 mEq/L (ref 3.5–5.1)
Sodium: 141 mEq/L (ref 135–145)

## 2016-10-28 LAB — LIPID PANEL
Cholesterol: 142 mg/dL (ref 0–200)
HDL: 32.9 mg/dL — AB (ref >39.00)
LDL Cholesterol: 74 mg/dL (ref 0–99)
NONHDL: 109.25
TRIGLYCERIDES: 177 mg/dL — AB (ref 0.0–149.0)
Total CHOL/HDL Ratio: 4
VLDL: 35.4 mg/dL (ref 0.0–40.0)

## 2016-10-28 LAB — HEPATIC FUNCTION PANEL
ALBUMIN: 4 g/dL (ref 3.5–5.2)
ALK PHOS: 83 U/L (ref 39–117)
ALT: 13 U/L (ref 0–53)
AST: 15 U/L (ref 0–37)
Bilirubin, Direct: 0.1 mg/dL (ref 0.0–0.3)
TOTAL PROTEIN: 6.7 g/dL (ref 6.0–8.3)
Total Bilirubin: 0.5 mg/dL (ref 0.2–1.2)

## 2016-10-28 LAB — POCT GLYCOSYLATED HEMOGLOBIN (HGB A1C): Hemoglobin A1C: 9.4

## 2016-10-28 MED ORDER — INSULIN NPH (HUMAN) (ISOPHANE) 100 UNIT/ML ~~LOC~~ SUSP: 300.0000 [IU] | SUBCUTANEOUS | 11 refills | Status: DC

## 2016-10-28 MED ORDER — SILDENAFIL CITRATE 100 MG PO TABS: 50.0000 mg | ORAL_TABLET | Freq: Every day | ORAL | 11 refills | Status: DC | PRN

## 2016-10-28 NOTE — Patient Instructions (Addendum)
Please increase the insulin to 300 units each morning.  blood tests are requested for you today.  We'll let you know about the results.  check your blood sugar twice a day.  vary the time of day when you check, between before the 3 meals, and at bedtime.  also check if you have symptoms of your blood sugar being too high or too low.  please keep a record of the readings and bring it to your next appointment here (or you can bring the meter itself).  You can write it on any piece of paper.  please call us sooner if your blood sugar goes below 70, or if you have a lot of readings over 200.   Please come back for a follow-up appointment in 2 months.

## 2016-10-28 NOTE — Progress Notes (Signed)
Subjective:    Patient ID: Ronald Johnson, male    DOB: July 13, 1946, 71 y.o.   MRN: [id number]  HPI Pt returns for f/u of diabetes mellitus: DM type: Insulin-requiring type 2.  Dx'ed: 6578 Complications: polyneuropathy, PAD, and nephropathy.  Therapy: insulin since 2004.  DKA: never.  Severe hypoglycemia: never.   Pancreatitis: never.   Other: he changed to qd insulin, after he did not achieve good control on multiple daily injections; he cannot afford insulin analogs; insulin requirement has decreased since cancer dx.  Interval history: he does not check cbg's. No recent steroids.   Past Medical History:  Diagnosis Date  . Anemia 08/15/2009  . Anxiety   . Arthritis   . [name]'s esophagus 2007  . COLONIC POLYPS, ADENOMATOUS 08/01/2008, 2013, 2014  . COPD (chronic obstructive pulmonary disease) (HCC)    no per pt  . Depression   . Diabetes type 2, controlled ([place]) 03/13/2007  . Diverticulitis   . ED (erectile dysfunction)   . EMPHYSEMA 11/08/2008   no per pt  . Esophageal cancer ([place]) "dx'd ~ 01/2015"  . GERD 03/13/2007  . GIB (gastrointestinal bleeding) 08/18/2015  . Hiatal hernia   . History of blood transfusion 08/18/2015   due to GIB  . HYPERCHOLESTEROLEMIA 02/03/2008  . HYPERTENSION 03/13/2007   pt denies, claims white coat syndrome  . Prostatism   . PULMONARY NODULE 11/24/2008  . Stomach cancer ([place]) "dx'd ~ 01/2015"  . TOBACCO ABUSE 11/24/2008    Past Surgical History:  Procedure Laterality Date  . BALLOON DILATION N/A 03/19/2016   Procedure: BALLOON DILATION;  Surgeon: Irene Shipper, MD;  [facility];  Service: Endoscopy;  Laterality: N/A;  . CIRCUMCISION    . COLONOSCOPY    . COMPLETE ESOPHAGECTOMY N/A 07/25/2015   Procedure: TRANSHIATAL TOTAL ESOPHAGECTOMY COMPLETE PYLOROMYOTOMY;  Surgeon: [name], MD;  Location: [place];  Service: Thoracic;  Laterality: N/A;  . EGD  11/19/2005  . ESOPHAGOGASTRODUODENOSCOPY N/A 03/19/2016   Procedure:  ESOPHAGOGASTRODUODENOSCOPY (EGD);  Surgeon: Irene Shipper, MD;  [facility];  Service: Endoscopy;  Laterality: N/A;  . EUS N/A 03/16/2015   Procedure: UPPER ENDOSCOPIC ULTRASOUND (EUS) RADIAL;  Surgeon: Milus Banister, MD;  [facility];  Service: Endoscopy;  Laterality: N/A;  . FOOT FRACTURE SURGERY Right 1980   right foot w/ pins and screws  . JEJUNOSTOMY N/A 07/25/2015   Procedure: FEEDING JEJUNOSTOMY;  Surgeon: [name], MD;  Location: [place];  Service: Thoracic;  Laterality: N/A;  . POLYPECTOMY    . removal pins and screws foot  1980   right foot  . UPPER GASTROINTESTINAL ENDOSCOPY    . VASECTOMY    . VIDEO BRONCHOSCOPY N/A 07/25/2015   Procedure: VIDEO BRONCHOSCOPY;  Surgeon: [name], MD;  Location: [facility] OR;  Service: Thoracic;  Laterality: N/A;    Social History   Social History  . Marital status: Married    Spouse name: N/A  . Number of children: 2  . Years of education: N/A   Occupational History  . 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 Smoking status: Former Smoker    Packs/day: 1.00    Years: 35.00    Types: Cigarettes    Quit date: 10/31/2008  . Smokeless tobacco: Never Used  . Alcohol use No  . Drug use: No  . Sexual activity: Not Currently   Other Topics Concern  . 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 atorvastatin (LIPITOR) 10 MG tablet Take 1 tablet by mouth  every day 90 tablet 3  . diazepam (VALIUM) 5 MG tablet Take 1 tablet (5 mg total) by mouth 2 (two) times daily as needed for anxiety. 180 tablet 1  . glucose blood (PRECISION XTRA TEST STRIPS) test strip Check blood sugar three times a day dx 250.01 270 each 3  .  Insulin Syringe-Needle U-100 30G X 3/8" 1 ML MISC Use to inject insulin 3 times daily. 300 each 1  . omeprazole (PRILOSEC) 40 MG capsule Take 1 capsule by mouth  daily (Patient taking differently: Take 1 capsule by mouth  twice a day) 30 capsule 3  . ondansetron (ZOFRAN ODT) 8 MG disintegrating tablet Take 1 tablet (8 mg total) by mouth 4 (four) times daily -  with meals and at bedtime. 120 tablet 5  . [DISCONTINUED] metoprolol succinate (TOPROL-XL) 25 MG 24 hr tablet Take 25 mg by mouth daily.      No current facility-administered medications on file prior to visit.     No Known Allergies  Family History  Problem Relation Age of Onset  . Stroke Mother   . Diabetes Mother   . Diabetes Maternal Grandmother     mother side of the family aunts, MGF  . Breast cancer Paternal Grandmother   . Colon cancer Neg Hx   . Esophageal cancer Neg Hx   . Rectal cancer Neg Hx   . Stomach cancer Neg Hx     BP 116/78   Pulse 86   Ht '6\' 4"'$  (1.93 m)   Wt 237 lb (107.5 kg)   SpO2 95%   BMI 28.85 kg/m    Review of Systems She denies hypoglycemia.  Nausea and ED sxs persist.     Objective:   Physical Exam VITAL SIGNS:  See vs page GENERAL: no distress Pulses: dorsalis pedis intact bilat.  MSK: no deformity of the feet CV: no leg edema Skin: no ulcer on the feet. normal color and temp on the feet. Neuro: sensation is intact to touch on the feet.    A1c=9.4%    Assessment & Plan:  ED: persistent: ref urol. Insulin-requiring type 2 DM, with PAD: he needs increased rx Nausea: I told pt this might improve with better glycemic control.  He has already seen oncol for this.  Please continue the same medication.  Patient is advised the following: Patient Instructions  Please increase the insulin to 300 units each morning.  blood tests are requested for you today.  We'll let you know about the results.  check your blood sugar twice a day.  vary the time of day when you check, between  before the 3 meals, and at bedtime.  also check if you have symptoms of your blood sugar being too high or too low.  please keep a record of the readings and bring it to your next appointment here (or you can bring the meter itself).  You can write it on any piece of paper.  please call us sooner if your blood sugar goes below 70, or if you have a lot of readings over 200.   Please come back  for a follow-up appointment in 2 months.

## 2016-10-29 ENCOUNTER — Telehealth: Payer: Self-pay | Admitting: Endocrinology

## 2016-10-29 LAB — PSA, MEDICARE: PSA: 1.06 ng/ml (ref 0.10–4.00)

## 2016-10-29 LAB — TSH: TSH: 2.53 u[IU]/mL (ref 0.35–4.50)

## 2016-10-29 MED ORDER — SILDENAFIL CITRATE 100 MG PO TABS: 50.0000 mg | ORAL_TABLET | Freq: Every day | ORAL | 11 refills | Status: DC | PRN

## 2016-10-29 NOTE — Telephone Encounter (Signed)
Pt called in and said that the Viagra was received by [facility] and was wanting [place] to resubmit. He would like a call back when this is complete.

## 2016-10-29 NOTE — Telephone Encounter (Signed)
Rx resubmitted to [facility]. Patient notified.

## 2016-11-01 ENCOUNTER — Encounter: Payer: Self-pay | Admitting: Dietician

## 2016-11-01 ENCOUNTER — Encounter: Payer: Medicare Other | Attending: Physician Assistant | Admitting: Dietician

## 2016-11-01 DIAGNOSIS — K2100 Gastro-esophageal reflux disease with esophagitis, without bleeding: Secondary | ICD-10-CM

## 2016-11-01 DIAGNOSIS — J449 Chronic obstructive pulmonary disease, unspecified: Secondary | ICD-10-CM | POA: Diagnosis not present

## 2016-11-01 DIAGNOSIS — Z713 Dietary counseling and surveillance: Secondary | ICD-10-CM | POA: Diagnosis not present

## 2016-11-01 DIAGNOSIS — E0865 Diabetes mellitus due to underlying condition with hyperglycemia: Secondary | ICD-10-CM

## 2016-11-01 DIAGNOSIS — F329 Major depressive disorder, single episode, unspecified: Secondary | ICD-10-CM | POA: Diagnosis not present

## 2016-11-01 DIAGNOSIS — I10 Essential (primary) hypertension: Secondary | ICD-10-CM | POA: Diagnosis not present

## 2016-11-01 DIAGNOSIS — E119 Type 2 diabetes mellitus without complications: Secondary | ICD-10-CM | POA: Insufficient documentation

## 2016-11-01 DIAGNOSIS — Z794 Long term (current) use of insulin: Secondary | ICD-10-CM

## 2016-11-01 DIAGNOSIS — K219 Gastro-esophageal reflux disease without esophagitis: Secondary | ICD-10-CM | POA: Insufficient documentation

## 2016-11-01 DIAGNOSIS — E0859 Diabetes mellitus due to underlying condition with other circulatory complications: Secondary | ICD-10-CM

## 2016-11-01 DIAGNOSIS — E785 Hyperlipidemia, unspecified: Secondary | ICD-10-CM | POA: Insufficient documentation

## 2016-11-01 DIAGNOSIS — Z8501 Personal history of malignant neoplasm of esophagus: Secondary | ICD-10-CM | POA: Insufficient documentation

## 2016-11-01 DIAGNOSIS — R110 Nausea: Secondary | ICD-10-CM | POA: Diagnosis not present

## 2016-11-01 DIAGNOSIS — R12 Heartburn: Secondary | ICD-10-CM | POA: Diagnosis not present

## 2016-11-01 DIAGNOSIS — IMO0002 Reserved for concepts with insufficient information to code with codable children: Secondary | ICD-10-CM

## 2016-11-01 NOTE — Progress Notes (Signed)
Medical Nutrition Therapy:  Appt start time: 2355 end time:  7322.   Assessment:  Primary concerns today: Patient is here today with his wife.  He is having nausea with meals, increased heartburn.  Patient has a hx of GE junction carcinoma-adenocarcinoma s/p XRT and s/p transhiatal total esophagectomy 07/25/15.  Increased nausea thought likely to radiation esophagitis/gastritis.  His cancer is currently in remission.  Other hx includes type 2 diabetes with A1C of 9.4% 10/28/16 decreased form 10.6% 08/30/16, constipation, GERD, HTN, hyperlipidemia, COPD, and significant depression.  Patient reports depression is worse in the winter and is not on medications due to increased side effects.  He has thoughts of self harm but no plan.  He is currently not in counseling but has been in the past. Patient states that he feels comfortable speaking with his wife regarding this.  Therapeutic Alternatives card was provided.  He no longer smokes.  Patient lives with his wife.  She does the shopping and cooking.  He is retired from [employer]. He is building a cabin, enjoys Proofreader and wood working but these are activities he does not do in the winter.  Encouraged him to find an indoor activity.  Preferred Learning Style:   No preference indicated   Learning Readiness:   Ready  Change in progress   MEDICATIONS: see list to include Prilosec   DIETARY INTAKE:  Usual eating pattern includes 3 meals and 6 snacks per day. Avoided foods include cheese, olives, tomatoes, water (severe heartburn).  24-hr recall:  B (7:30 AM): [employer] toast with cream cheese and jelly, regular coffee with splenda and cream Snk (9AM): 2 scrambled eggs, bologna or ham, whole milk  Snk (10AM):  Protein bar or banana Snk (11AM:  Whatever is available L (11:30-12 PM): tuna salad, 2 slices bread and soup ("don't tolerate soup or 2 slices bread"  More of problems start with lunch OR iceburg lettuce, ham sandwich OR  leftovers Snk (2PM): whatever is available (granola bar often)  Snk (3PM) coffee with splenda and cream occasionally with cake or other snack D (5-5:30 PM): meatloaf or chicken or pork chop with green beans, rice or potatoes or noodles OR Out to eat:  Gashans (steak sandwich and french fries) Snk (9 PM): ice cream OR granola bar   Bed:  9-9:30 Beverages: coffee with splenda and cream, diet coke, smoothie (spinach, banana, pineapple, kiwi), G2 gatorade, whole milk, diet cranberry grape juice  Usual physical activity: none in the winter, cares for lawn, currently building a cabin, lifts weights at the senior center 3 days per week.  Estimated energy needs: 2600 calories 312 g carbohydrates 150 g protein 75 g fat  Progress Towards Goal(s):  In progress.   Nutritional Diagnosis:  NB-1.1 Food and nutrition-related knowledge deficit As related to esophagectomy and type 2 diabetes.  As evidenced by patient and wife report.    Intervention:  Nutrition Counseling/Education related to type 2 diabetes, reflux and post esophagectomy as well as constipation.  Encouraged increased fluid intake.  Discuss medications for constipation and acid with MD. Discussed continued need to have small frequent meals and avoid trigger foods.  Suggested keeping a food diary to better help identify triggers. Discussed what trigger foods often are.  Increase your fluid intake. Consider 10-12 cups fluid daily. Consider trials with your beverage choices.  Cola, coffee, and anything else with caffeine can all cause stomach problems. Consider keeping a food diary with symptoms. Discuss acid reducing medication with your doctor. Consider  trying Boost Glucose Control or Glucerna, sugar free Carnation Breakfast Essentials or Ensure 1-2 times per day. Continue small frequent meals. Try to have a protein choice each time you eat. Keep meals small!  Rather than having a larger portion, make two small plates, eat one and save  another for a snack. What is causing the nausea (fat or acid, spices, specific food, portion size???) Keep meals and snacks low fat. Continue to stay active most days.  Consider these options:  Split pea soup  Lentil soup (without tomatoes)  Bean soup  Chicken and rice stew  [place] yogurt (15 grams carbs) or Keifer  Sugar free pudding  Teaching Method Utilized:  Visual Auditory Hands on  Handouts given during visit include:  Post esophagectomy nutrition  Gastro Esophageal Reflux Nutrition Therapy from [facility]  Barriers to learning/adherence to lifestyle change: depression, nausea, feels unwell most of the time, poor energy  Demonstrated degree of understanding via:  Teach Back   Monitoring/Evaluation:  Dietary intake, exercise, and body weight prn.

## 2016-11-01 NOTE — Patient Instructions (Addendum)
Increase your fluid intake. Consider 10-12 cups fluid daily. Consider trials with your beverage choices.  Cola, coffee, and anything else with caffeine can all cause stomach problems. Consider keeping a food diary with symptoms. Discuss acid reducing medication with your doctor. Consider trying Boost Glucose Control or Glucerna, sugar free Carnation Breakfast Essentials or Ensure 1-2 times per day. Continue small frequent meals. Try to have a protein choice each time you eat. Keep meals small!  Rather than having a larger portion, make two small plates, eat one and save another for a snack. What is causing the nausea (fat or acid, spices, specific food, portion size???) Keep meals and snacks low fat. Continue to stay active most days.  Consider these options:  Split pea soup  Lentil soup (without tomatoes)  Bean soup  Chicken and rice stew  Greek yogurt (15 grams carbs) or Keifer  Sugar free pudding

## 2016-11-15 ENCOUNTER — Other Ambulatory Visit: Payer: Self-pay | Admitting: Endocrinology

## 2016-11-20 ENCOUNTER — Encounter: Payer: Self-pay | Admitting: Physician Assistant

## 2016-11-20 ENCOUNTER — Ambulatory Visit (INDEPENDENT_AMBULATORY_CARE_PROVIDER_SITE_OTHER): Payer: Medicare Other | Admitting: Physician Assistant

## 2016-11-20 VITALS — BP 110/68 | HR 100 | Ht 74.75 in | Wt 238.2 lb

## 2016-11-20 DIAGNOSIS — R1310 Dysphagia, unspecified: Secondary | ICD-10-CM | POA: Diagnosis not present

## 2016-11-20 DIAGNOSIS — K219 Gastro-esophageal reflux disease without esophagitis: Secondary | ICD-10-CM | POA: Diagnosis not present

## 2016-11-20 DIAGNOSIS — R110 Nausea: Secondary | ICD-10-CM

## 2016-11-20 MED ORDER — DEXLANSOPRAZOLE 60 MG PO CPDR: 60.0000 mg | DELAYED_RELEASE_CAPSULE | Freq: Every day | ORAL | 2 refills | Status: DC

## 2016-11-20 NOTE — Patient Instructions (Signed)
We have sent the following medications to your pharmacy for you to pick up at your convenience: dexilant 60 mg daily  Continue Zofran 8 mg scheduled  You have been scheduled for an abdominal ultrasound at [facility] (1st floor of hospital) on 11-26-16 at 7:30 am. Please arrive 15 minutes prior to your appointment for registration. Make certain not to have anything to eat or drink 6 hours prior to your appointment. Should you need to reschedule your appointment, please contact radiology at [phone]. This test typically takes about 30 minutes to perform.  You have been scheduled for an endoscopy. Please follow written instructions given to you at your visit today. If you use inhalers (even only as needed), please bring them with you on the day of your procedure. Your physician has requested that you go to www.startemmi.com and enter the access code given to you at your visit today. This web site gives a general overview about your procedure. However, you should still follow specific instructions given to you by our office regarding your preparation for the procedure.

## 2016-11-20 NOTE — Progress Notes (Signed)
Initial assessment and plan as noted 

## 2016-11-20 NOTE — Progress Notes (Signed)
Chief Complaint: GERD, Nausea  HPI:  Ronald Johnson is a 71 year old Caucasian male with a past medical history of multiple medical problems including insulin requiring diabetes, COPD and esophageal cancer for which he is status post neoadjuvant radiation and chemotherapy followed by esophagectomy December 2016 who returns to clinic today with a new complaint of increasing reflux as well as for follow-up of his nausea.     Patient was last seen in clinic by me on 10/02/16 at that time described continued nausea, his last EGD was completed 03/19/16 with findings of an anastomotic stricture which was dilated to 18 mm by Dr. Marina Goodell. At that time a CT of the abdomen and pelvis was ordered which returned normal. Patient was continued on Omeprazole 40 mg twice a day and his Zofran was scheduled 20-30 minutes before eating 3 times a day and at bedtime.   Today, the patient returns to clinic and tells me that scheduling a Zofran before meals has alleviated his nausea. He has had an increasing reflux symptoms since being seen last year irregardless of his Omeprazole 40 mg twice a day. The patient tells me that now he just has to drink a sip of water and he will have reflux symptoms. He notes that he has been on multiple other medications in the past to help with reflux including Pantoprazole and Nexium, both of these stopped working over time. Currently he is continuing his Omeprazole and taking over-the-counter Rolaids/Alka-Seltzer. He tells me that exercise also exacerbates his symptoms. He denies use of NSAIDs or change in diet recently.   Patient also describes today that he is having increasing sensation of food getting "hung" in my throat. He tells me this feels very similar to prior to his dilation last year. He tells me that Dr. Marina Goodell said he would need to repeat the endoscopy whenever he had repeat symptoms. He thinks he will be due in a few months as his symptoms have been increasing recently.   Patient denies  fever, chills, blood in the stool, melena, weight loss, fatigue, anorexia  or symptoms that awaken him at night.  Past Medical History:  Diagnosis Date  . Anemia 08/15/2009  . Anxiety   . Arthritis   . [name]'s esophagus 2007  . COLONIC POLYPS, ADENOMATOUS 08/01/2008, 2013, 2014  . COPD (chronic obstructive pulmonary disease) (HCC)    no per pt  . Depression   . Diabetes type 2, controlled (HCC) 03/13/2007  . Diverticulitis   . ED (erectile dysfunction)   . EMPHYSEMA 11/08/2008   no per pt  . Esophageal cancer (HCC) "dx'd ~ 01/2015"  . GERD 03/13/2007  . GIB (gastrointestinal bleeding) 08/18/2015  . Hiatal hernia   . History of blood transfusion 08/18/2015   due to GIB  . HYPERCHOLESTEROLEMIA 02/03/2008  . HYPERTENSION 03/13/2007   pt denies, claims white coat syndrome  . Prostatism   . PULMONARY NODULE 11/24/2008  . Stomach cancer (HCC) "dx'd ~ 01/2015"  . TOBACCO ABUSE 11/24/2008    Past Surgical History:  Procedure Laterality Date  . BALLOON DILATION N/A 03/19/2016   Procedure: BALLOON DILATION;  Surgeon: Hilarie Fredrickson, MD;  Location: [facility];  Service: Endoscopy;  Laterality: N/A;  . CIRCUMCISION    . COLONOSCOPY    . COMPLETE ESOPHAGECTOMY N/A 07/25/2015   Procedure: TRANSHIATAL TOTAL ESOPHAGECTOMY COMPLETE PYLOROMYOTOMY;  Surgeon: Delight Ovens, MD;  Location: [facility] OR;  Service: Thoracic;  Laterality: N/A;  . EGD  11/19/2005  . ESOPHAGOGASTRODUODENOSCOPY N/A 03/19/2016  Procedure: ESOPHAGOGASTRODUODENOSCOPY (EGD);  Surgeon: Irene Shipper, MD;  Location: [facility];  Service: Endoscopy;  Laterality: N/A;  . EUS N/A 03/16/2015   Procedure: UPPER ENDOSCOPIC ULTRASOUND (EUS) RADIAL;  Surgeon: Milus Banister, MD;  Location: [facility];  Service: Endoscopy;  Laterality: N/A;  . FOOT FRACTURE SURGERY Right 1980   right foot w/ pins and screws  . JEJUNOSTOMY N/A 07/25/2015   Procedure: FEEDING JEJUNOSTOMY;  Surgeon: [name], MD;  Location: [facility];  Service: Thoracic;   Laterality: N/A;  . POLYPECTOMY    . removal pins and screws foot  1980   right foot  . UPPER GASTROINTESTINAL ENDOSCOPY    . VASECTOMY    . VIDEO BRONCHOSCOPY N/A 07/25/2015   Procedure: VIDEO BRONCHOSCOPY;  Surgeon: [name], MD;  Location[facility] OR;  Service: Thoracic;  Laterality: N/A;    Current Outpatient Prescriptions  Medication Sig Dispense Refill  . atorvastatin (LIPITOR) 10 MG tablet Take 1 tablet by mouth  every day 90 tablet 3  . diazepam (VALIUM) 5 MG tablet Take 1 tablet (5 mg total) by mouth 2 (two) times daily as needed for anxiety. 180 tablet 1  . glucose blood (PRECISION XTRA TEST STRIPS) test strip Check blood sugar three times a day dx 250.01 270 each 3  . insulin NPH Human (HUMULIN N) 100 UNIT/ML injection Inject 3 mLs (300 Units total) into the skin every morning. 10 vial 11  . Insulin Syringe-Needle U-100 30G X 3/8" 1 ML MISC Use to inject insulin 3 times daily. 300 each 1  . omeprazole (PRILOSEC) 40 MG capsule Take 40 mg by mouth 2 (two) times daily.    . ondansetron (ZOFRAN ODT) 8 MG disintegrating tablet Take 1 tablet (8 mg total) by mouth 4 (four) times daily -  with meals and at bedtime. 120 tablet 5  . sildenafil (REVATIO) 20 MG tablet TAKE 1-5 TABLETS BY MOUTH ONCE DAILY AS NEEDED 50 tablet 0  . sildenafil (VIAGRA) 100 MG tablet Take 0.5-1 tablets (50-100 mg total) by mouth daily as needed for erectile dysfunction. 5 tablet 11  . dexlansoprazole (DEXILANT) 60 MG capsule Take 1 capsule (60 mg total) by mouth daily. 30 capsule 2   No current facility-administered medications for this visit.     Allergies as of 11/20/2016  . (No Known Allergies)    Family History  Problem Relation Age of Onset  . Stroke Mother   . Diabetes Mother   . Diabetes Maternal Grandmother     mother side of the family aunts, MGF  . Breast cancer Paternal Grandmother   . Colon cancer Neg Hx   . Esophageal cancer Neg Hx   . Rectal cancer Neg Hx   . Stomach cancer Neg Hx       Social History   Social History  . Marital status: Married    Spouse name: N/A  . Number of children: 2  . Years of education: N/A   Occupational History  . 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 Smoking status: Former Smoker    Packs/day: 1.00    Years: 35.00    Types: Cigarettes    Quit date: 10/31/2008  . Smokeless tobacco: Never Used  . Alcohol use No  . Drug use: No  . Sexual activity: Not Currently   Other Topics Concern  . Not on file   Social History Narrative   Married, wife St[place]is with #  2 grown children   Prior [employer], where he met his wife in [place]   Retired-prior work Personnel officer ADL's    Review of Systems:    Constitutional: No weight loss, fever or chills Skin: No rash  Cardiovascular: No chest pain  Respiratory: No SOB Gastrointestinal: See HPI and otherwise negative   Physical Exam:  Vital signs: BP 110/68 (BP Location: Left Arm, Patient Position: Sitting, Cuff Size: Normal)   Pulse 100   Ht 6' 2.75" (1.899 m)   Wt 238 lb 4 oz (108.1 kg)   BMI 29.98 kg/m   Constitutional:   Pleasant Caucasian male appears to be in NAD, Well developed, Well nourished, alert and cooperative Respiratory: Respirations even and unlabored. Lungs clear to auscultation bilaterally.   No wheezes, crackles, or rhonchi.  Cardiovascular: Normal S1, S2. No MRG. Regular rate and rhythm. No peripheral edema, cyanosis or pallor.  Gastrointestinal:  Soft, nondistended, nontender. No rebound or guarding. Normal bowel sounds. No appreciable masses or hepatomegaly.Marland Kitchen Psychiatric: Demonstrates good judgement and reason without abnormal affect or behaviors.  RECENT LABS AND IMAGING: CBC    Component Value Date/Time   WBC 6.2 10/22/2016 1136   WBC 4.0 02/29/2016 0921   RBC 3.96 (L) 10/22/2016 1136   RBC 3.87 (L) 02/29/2016 0921   HGB 12.4 (L) 10/22/2016 1136   HCT 36.7 (L) 10/22/2016 1136   PLT 128  (L) 10/22/2016 1136   MCV 92.7 10/22/2016 1136   MCH 31.3 10/22/2016 1136   MCH 31.7 08/22/2015 0310   MCHC 33.8 10/22/2016 1136   MCHC 34.0 02/29/2016 0921   RDW 12.9 10/22/2016 1136   LYMPHSABS 2.3 10/22/2016 1136   MONOABS 0.4 10/22/2016 1136   EOSABS 0.2 10/22/2016 1136   BASOSABS 0.0 10/22/2016 1136    CMP     Component Value Date/Time   NA 141 10/28/2016 0821   NA 141 04/23/2016 0956   K 4.3 10/28/2016 0821   K 4.7 04/23/2016 0956   CL 102 10/28/2016 0821   CO2 32 10/28/2016 0821   CO2 24 04/23/2016 0956   GLUCOSE 199 (H) 10/28/2016 0821   GLUCOSE 177 (H) 04/23/2016 0956   GLUCOSE 129 (H) 06/30/2006 0809   BUN 17 10/28/2016 0821   BUN 19.3 04/23/2016 0956   CREATININE 1.11 10/28/2016 0821   CREATININE 1.2 04/23/2016 0956   CALCIUM 9.4 10/28/2016 0821   CALCIUM 9.5 04/23/2016 0956   PROT 6.7 10/28/2016 0821   PROT 6.7 05/04/2015 1049   ALBUMIN 4.0 10/28/2016 0821   ALBUMIN 3.5 05/04/2015 1049   AST 15 10/28/2016 0821   AST 15 05/04/2015 1049   ALT 13 10/28/2016 0821   ALT 14 05/04/2015 1049   ALKPHOS 83 10/28/2016 0821   ALKPHOS 77 05/04/2015 1049   BILITOT 0.5 10/28/2016 0821   BILITOT 0.36 05/04/2015 1049   GFRNONAA >60 08/19/2015 0508   GFRAA >60 08/19/2015 0508    Assessment: 1. GERD: Increase in symptoms over the past month, now currently even if he drinks water irregardless of Omeprazole 40 mg twice a day, has tried pantoprazole Nexium in the past which have stopped working for him; consider possible relation to gallbladder disease versus other 2. Nausea: Better on scheduled Zofran 3. Dysphagia: Increasing for the patient; likely related to repeat anastomotic stricture 4. History of esophageal cancer  Plan: 1. Scheduled patient for an EGD with dilation in the [place] with Dr. Henrene Pastor. Discussed risks, benefits, limitations and alternatives and patient agrees to proceed. 2.  Change patient from Omeprazole 40 mg twice a day to Dexilant 60 mg daily. Did  discuss that if this is too expensive for him he can try AcipHex 30 mg twice a day 3. Patient to continue his scheduled Zofran 4 times daily 20-30 minutes before meals and at bedtime 4. Per recommendations from Dr. Henrene Pastor, scheduled a right upper quadrant ultrasound for further evaluations of patient's nausea and now increasing reflux to consider a gallbladder etiology 5. Patient to follow in clinic per recommendations from Dr. Henrene Pastor after time of procedure or sooner if necessary  Ellouise Newer, PA-C [facility] 11/20/2016, 3:30 PM  Cc: [name], MD

## 2016-11-21 ENCOUNTER — Telehealth: Payer: Self-pay | Admitting: Physician Assistant

## 2016-11-21 MED ORDER — PANTOPRAZOLE SODIUM 40 MG PO TBEC: 40.0000 mg | DELAYED_RELEASE_TABLET | Freq: Every day | ORAL | 1 refills | Status: DC

## 2016-11-21 NOTE — Telephone Encounter (Signed)
Patient calling back requesting callback on cell # [phone]

## 2016-11-21 NOTE — Telephone Encounter (Signed)
Patient called in stating Dexilant was too expensive. Called and left a message on patient's voicemail that new medication will be sent to pharmacy (Pantoprazole 40mg  once daily #30 with 1 refill). Patient has already tried omeprazole and Nexium.

## 2016-11-26 ENCOUNTER — Ambulatory Visit (HOSPITAL_COMMUNITY)
Admission: RE | Admit: 2016-11-26 | Discharge: 2016-11-26 | Disposition: A | Discharge status: Home / Self Care | Payer: Medicare Other | Source: Ambulatory Visit | Attending: Physician Assistant | Admitting: Physician Assistant

## 2016-11-26 DIAGNOSIS — K219 Gastro-esophageal reflux disease without esophagitis: Secondary | ICD-10-CM | POA: Diagnosis not present

## 2016-11-26 DIAGNOSIS — R110 Nausea: Secondary | ICD-10-CM | POA: Diagnosis not present

## 2016-11-26 DIAGNOSIS — R1310 Dysphagia, unspecified: Secondary | ICD-10-CM

## 2016-11-26 DIAGNOSIS — N281 Cyst of kidney, acquired: Secondary | ICD-10-CM | POA: Insufficient documentation

## 2016-12-12 ENCOUNTER — Telehealth: Payer: Self-pay | Admitting: Endocrinology

## 2016-12-12 NOTE — Telephone Encounter (Signed)
Pt called back in to speak with Jinny Blossom, he stated he needs to speak with her personally. [phone]

## 2016-12-12 NOTE — Telephone Encounter (Signed)
Patient ask you to give him a call °

## 2016-12-12 NOTE — Telephone Encounter (Signed)
Requested a call back from the patient to further discuss.  

## 2016-12-12 NOTE — Telephone Encounter (Signed)
Requested a call back from the patient to discuss further.  

## 2016-12-13 NOTE — Telephone Encounter (Signed)
Patient notified and voiced understanding.

## 2016-12-13 NOTE — Telephone Encounter (Signed)
Patient is returning your call.  

## 2016-12-13 NOTE — Telephone Encounter (Signed)
I contacted the patient. He wanted to know if we could write a prescription for 1 foley catheter size 12. Patient stated [facility] went to [facility] and was unable to be seen because during his visit the MD was called out due to an emergency surgery and will not been seen until the end of the month. Patient stated he has been having pain and discomfort due to his swollen prostate and thinks the catheter will help. Patient stated he could self catheterize.  Please advise, Thanks!

## 2016-12-13 NOTE — Telephone Encounter (Signed)
Please call urol and explain to them

## 2016-12-16 ENCOUNTER — Telehealth: Payer: Self-pay | Admitting: Physician Assistant

## 2016-12-17 ENCOUNTER — Telehealth: Payer: Self-pay | Admitting: Physician Assistant

## 2016-12-17 MED ORDER — DEXLANSOPRAZOLE 60 MG PO CPDR: 60.0000 mg | DELAYED_RELEASE_CAPSULE | Freq: Every day | ORAL | 3 refills | Status: DC

## 2016-12-17 MED ORDER — RABEPRAZOLE SODIUM 20 MG PO TBEC: 20.0000 mg | DELAYED_RELEASE_TABLET | Freq: Two times a day (BID) | ORAL | 3 refills | Status: DC

## 2016-12-17 NOTE — Telephone Encounter (Signed)
Patient notified that aciphex has been sent in to his pharmacy.  See OV visit on 11/20/16

## 2016-12-17 NOTE — Telephone Encounter (Signed)
Patient was to change to dexilant 60 mg at the last OV with Ellouise Newer, PA he is asked to go pick that up.  He will call back if it is too expensive for the alternative tx in her note from 11/20/16

## 2016-12-31 ENCOUNTER — Ambulatory Visit (INDEPENDENT_AMBULATORY_CARE_PROVIDER_SITE_OTHER): Payer: Medicare Other | Admitting: Endocrinology

## 2016-12-31 ENCOUNTER — Encounter: Payer: Self-pay | Admitting: Endocrinology

## 2016-12-31 VITALS — BP 122/78 | HR 93 | Ht 75.0 in | Wt 236.0 lb

## 2016-12-31 DIAGNOSIS — E0859 Diabetes mellitus due to underlying condition with other circulatory complications: Secondary | ICD-10-CM

## 2016-12-31 DIAGNOSIS — Z794 Long term (current) use of insulin: Secondary | ICD-10-CM | POA: Diagnosis not present

## 2016-12-31 DIAGNOSIS — IMO0002 Reserved for concepts with insufficient information to code with codable children: Secondary | ICD-10-CM

## 2016-12-31 DIAGNOSIS — E0865 Diabetes mellitus due to underlying condition with hyperglycemia: Secondary | ICD-10-CM

## 2016-12-31 LAB — POCT GLYCOSYLATED HEMOGLOBIN (HGB A1C): HEMOGLOBIN A1C: 8.6

## 2016-12-31 MED ORDER — VARDENAFIL HCL 20 MG PO TABS: 20.0000 mg | ORAL_TABLET | Freq: Every day | ORAL | 1 refills | Status: DC | PRN

## 2016-12-31 MED ORDER — INSULIN NPH (HUMAN) (ISOPHANE) 100 UNIT/ML ~~LOC~~ SUSP: 330.0000 [IU] | SUBCUTANEOUS | 11 refills | Status: DC

## 2016-12-31 NOTE — Patient Instructions (Addendum)
Please increase the insulin to 330 units each morning.  On this type of insulin schedule, you should eat meals on a regular schedule.  If a meal is missed or significantly delayed, your blood sugar could go low.  I have sent a prescription to your pharmacy, for [place].    check your blood sugar twice a day.  vary the time of day when you check, between before the 3 meals, and at bedtime.  also check if you have symptoms of your blood sugar being too high or too low.  please keep a record of the readings and bring it to your next appointment here (or you can bring the meter itself).  You can write it on any piece of paper.  please call us sooner if your blood sugar goes below 70, or if you have a lot of readings over 200.   Please come back for a follow-up appointment in 2 months.

## 2016-12-31 NOTE — Progress Notes (Signed)
Subjective:    Patient ID: Ronald Johnson, male    DOB: [birth year], 71 y.o.   MRN: [id number]  HPI Pt returns for f/u of diabetes mellitus: DM type: Insulin-requiring type 2.  Dx'ed: 1324 Complications: polyneuropathy, PAD, and nephropathy.  Therapy: insulin since 2004.  DKA: never.  Severe hypoglycemia: never.   Pancreatitis: never.   Other: he changed to qd insulin, after he did not achieve good control on multiple daily injections; he cannot afford insulin analogs; insulin requirement has decreased since cancer dx.  Interval history: he does not check cbg's. No recent steroids.  He seldom has hypoglycemia, and these episodes are mild.  He takes 200 units qam and 100 units qpm.   ED persists.  His urol appt was cancelled.  He wants to try an alternative to viagra.  Past Medical History:  Diagnosis Date  . Anemia 08/15/2009  . Anxiety   . Arthritis   . [name]'s esophagus 2007  . COLONIC POLYPS, ADENOMATOUS 08/01/2008, 2013, 2014  . COPD (chronic obstructive pulmonary disease) (HCC)    no per pt  . Depression   . Diabetes type 2, controlled ([place]) 03/13/2007  . Diverticulitis   . ED (erectile dysfunction)   . EMPHYSEMA 11/08/2008   no per pt  . Esophageal cancer ([place]) "dx'd ~ 01/2015"  . GERD 03/13/2007  . GIB (gastrointestinal bleeding) 08/18/2015  . Hiatal hernia   . History of blood transfusion 08/18/2015   due to GIB  . HYPERCHOLESTEROLEMIA 02/03/2008  . HYPERTENSION 03/13/2007   pt denies, claims white coat syndrome  . Prostatism   . PULMONARY NODULE 11/24/2008  . Stomach cancer ([place]) "dx'd ~ 01/2015"  . TOBACCO ABUSE 11/24/2008    Past Surgical History:  Procedure Laterality Date  . BALLOON DILATION N/A 03/19/2016   Procedure: BALLOON DILATION;  Surgeon: Irene Shipper, MD;  Location: [facility];  Service: Endoscopy;  Laterality: N/A;  . CIRCUMCISION    . COLONOSCOPY    . COMPLETE ESOPHAGECTOMY N/A 07/25/2015   Procedure: TRANSHIATAL TOTAL ESOPHAGECTOMY COMPLETE  PYLOROMYOTOMY;  Surgeon: [name], MD;  Location: [facility];  Service: Thoracic;  Laterality: N/A;  . EGD  11/19/2005  . ESOPHAGOGASTRODUODENOSCOPY N/A 03/19/2016   Procedure: ESOPHAGOGASTRODUODENOSCOPY (EGD);  Surgeon: Irene Shipper, MD;  Location: [facility];  Service: Endoscopy;  Laterality: N/A;  . EUS N/A 03/16/2015   Procedure: UPPER ENDOSCOPIC ULTRASOUND (EUS) RADIAL;  Surgeon: Milus Banister, MD;  Location: [facility];  Service: Endoscopy;  Laterality: N/A;  . FOOT FRACTURE SURGERY Right 1980   right foot w/ pins and screws  . JEJUNOSTOMY N/A 07/25/2015   Procedure: FEEDING JEJUNOSTOMY;  Surgeon: [name], MD;  Location: [facility];  Service: Thoracic;  Laterality: N/A;  . POLYPECTOMY    . removal pins and screws foot  1980   right foot  . UPPER GASTROINTESTINAL ENDOSCOPY    . VASECTOMY    . VIDEO BRONCHOSCOPY N/A 07/25/2015   Procedure: VIDEO BRONCHOSCOPY;  Surgeon: [name], MD;  Location: [facility] OR;  Service: Thoracic;  Laterality: N/A;    Social History   Social History  . Marital status: Married    Spouse name: N/A  . Number of children: 2  . Years of education: N/A   Occupational History  . 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 Smoking status: Former Smoker    Packs/day: 1.00    Years: 35.00  Types: Cigarettes    Quit date: 10/31/2008  . Smokeless tobacco: Never Used  . Alcohol use No  . Drug use: No  . Sexual activity: Not Currently   Other Topics Concern  . 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 atorvastatin (LIPITOR) 10 MG tablet Take 1 tablet by mouth  every day 90 tablet 3  . diazepam (VALIUM) 5 MG tablet Take 1 tablet (5 mg total) by  mouth 2 (two) times daily as needed for anxiety. 180 tablet 1  . glucose blood (PRECISION XTRA TEST STRIPS) test strip Check blood sugar three times a day dx 250.01 270 each 3  . Insulin Syringe-Needle U-100 30G X 3/8" 1 ML MISC Use to inject insulin 3 times daily. 300 each 1  . RABEprazole (ACIPHEX) 20 MG tablet Take 1 tablet (20 mg total) by mouth 2 (two) times daily. 60 tablet 3  . sildenafil (REVATIO) 20 MG tablet TAKE 1-5 TABLETS BY MOUTH ONCE DAILY AS NEEDED 50 tablet 0  . ondansetron (ZOFRAN ODT) 8 MG disintegrating tablet Take 1 tablet (8 mg total) by mouth 4 (four) times daily -  with meals and at bedtime. (Patient not taking: Reported on 12/31/2016) 120 tablet 5  . [DISCONTINUED] metoprolol succinate (TOPROL-XL) 25 MG 24 hr tablet Take 25 mg by mouth daily.      No current facility-administered medications on file prior to visit.     No Known Allergies  Family History  Problem Relation Age of Onset  . Stroke Mother   . Diabetes Mother   . Diabetes Maternal Grandmother        mother side of the family aunts, MGF  . Breast cancer Paternal Grandmother   . Colon cancer Neg Hx   . Esophageal cancer Neg Hx   . Rectal cancer Neg Hx   . Stomach cancer Neg Hx     BP 122/78   Pulse 93   Ht '6\' 3"'$  (1.905 m)   Wt 236 lb (107 kg)   SpO2 94%   BMI 29.50 kg/m   Review of Systems He is regaining weight.      Objective:   Physical Exam VITAL SIGNS:  See vs page.  GENERAL: no distress. Pulses: dorsalis pedis intact bilat.   MSK: no deformity of the feet.  CV: no leg edema.   Skin:  no ulcer on the feet.  normal color and temp on the feet.   Neuro: sensation is intact to touch on the feet.    A1c=8.6%    Assessment & Plan:  Insulin-requiring type 2 DM, with PAD: he needs increased rx.   ED, persistent Noncompliance with cbg recording and insulin dosing.  We discussed the need to take insulin according to cbg's.    Patient Instructions  Please increase the insulin to 330  units each morning.  On this type of insulin schedule, you should eat meals on a regular schedule.  If a meal is missed or significantly delayed, your blood sugar could go low.  I have sent a prescription to your pharmacy, for [place].    check your blood sugar twice a day.  vary the time of day when you check, between before the 3 meals,  and at bedtime.  also check if you have symptoms of your blood sugar being too high or too low.  please keep a record of the readings and bring it to your next appointment here (or you can bring the meter itself).  You can write it on any piece of paper.  please call us sooner if your blood sugar goes below 70, or if you have a lot of readings over 200.   Please come back for a follow-up appointment in 2 months.

## 2017-01-02 NOTE — Telephone Encounter (Signed)
Please reduce insulin to 310 units qam. What time of day did the low blood sugar happen?

## 2017-01-02 NOTE — Telephone Encounter (Signed)
I contacted the patient and he stated he took 330 units yesterday and his blood sugar in the low 70's. Patient stated he could not move and his blood sugar staid in the low 70's. Can he split the dosage up?  Please advise, Thanks!

## 2017-01-02 NOTE — Telephone Encounter (Signed)
Patient called need to make correction on insulin insulin NPH Human (HUMULIN N) 100 UNIT/ML injection Please advise

## 2017-01-03 NOTE — Telephone Encounter (Signed)
Spoke with the patient and he stated an understanding about the insulin change and he stated that the low blood sugar happened in the morning

## 2017-01-03 NOTE — Telephone Encounter (Signed)
Left a vm letting patient know of the insulin change and I requested a call back to get  Few of his readings and the time of day for the readings

## 2017-01-15 DIAGNOSIS — L814 Other melanin hyperpigmentation: Secondary | ICD-10-CM | POA: Diagnosis not present

## 2017-01-15 DIAGNOSIS — L821 Other seborrheic keratosis: Secondary | ICD-10-CM | POA: Diagnosis not present

## 2017-01-15 DIAGNOSIS — D2239 Melanocytic nevi of other parts of face: Secondary | ICD-10-CM | POA: Diagnosis not present

## 2017-01-15 DIAGNOSIS — D225 Melanocytic nevi of trunk: Secondary | ICD-10-CM | POA: Diagnosis not present

## 2017-01-15 DIAGNOSIS — L570 Actinic keratosis: Secondary | ICD-10-CM | POA: Diagnosis not present

## 2017-01-15 DIAGNOSIS — D485 Neoplasm of uncertain behavior of skin: Secondary | ICD-10-CM | POA: Diagnosis not present

## 2017-01-16 ENCOUNTER — Telehealth: Payer: Self-pay | Admitting: Endocrinology

## 2017-01-16 NOTE — Telephone Encounter (Signed)
please call patient: Ins says you do not qualify for cialis.  Please see the urologist as scheduled.

## 2017-01-16 NOTE — Telephone Encounter (Signed)
Left detailed message and requested a call back if he had any questions 

## 2017-01-23 ENCOUNTER — Encounter: Payer: Self-pay | Admitting: Cardiothoracic Surgery

## 2017-01-27 ENCOUNTER — Encounter: Payer: Self-pay | Admitting: Internal Medicine

## 2017-01-27 ENCOUNTER — Ambulatory Visit (AMBULATORY_SURGERY_CENTER): Payer: Medicare Other | Admitting: Internal Medicine

## 2017-01-27 VITALS — BP 106/74 | HR 78 | Temp 98.0°F | Resp 18 | Ht 75.0 in | Wt 236.0 lb

## 2017-01-27 DIAGNOSIS — J439 Emphysema, unspecified: Secondary | ICD-10-CM | POA: Diagnosis not present

## 2017-01-27 DIAGNOSIS — K219 Gastro-esophageal reflux disease without esophagitis: Secondary | ICD-10-CM | POA: Diagnosis not present

## 2017-01-27 DIAGNOSIS — R110 Nausea: Secondary | ICD-10-CM | POA: Diagnosis not present

## 2017-01-27 DIAGNOSIS — K222 Esophageal obstruction: Secondary | ICD-10-CM | POA: Diagnosis not present

## 2017-01-27 DIAGNOSIS — R1310 Dysphagia, unspecified: Secondary | ICD-10-CM

## 2017-01-27 DIAGNOSIS — E669 Obesity, unspecified: Secondary | ICD-10-CM | POA: Diagnosis not present

## 2017-01-27 DIAGNOSIS — E119 Type 2 diabetes mellitus without complications: Secondary | ICD-10-CM | POA: Diagnosis not present

## 2017-01-27 MED ORDER — SODIUM CHLORIDE 0.9 % IV SOLN: 500.0000 mL | INTRAVENOUS | Status: DC

## 2017-01-27 NOTE — Progress Notes (Signed)
Report to PACU, RN, vss, BBS= Clear.  

## 2017-01-27 NOTE — Op Note (Signed)
[facility] Patient Name: Ronald Johnson Procedure Date: 01/27/2017 10:04 AM MRN: [id number] Endoscopist: Docia Chuck. Henrene Pastor , MD Age: 71 Referring MD:  Date of Birth: 1946-05-24 Gender: Male Account #: [id number] Procedure:                Upper GI endoscopy, with balloon dilation of the                            esophagus/anastomosis Indications:              Dysphagia. Status post esophagectomy December 2016.                            Prior esophageal dilation for anastomotic stricture                            17 Medicines:                Monitored Anesthesia Care Procedure:                Pre-Anesthesia Assessment:                           - Prior to the procedure, a History and Physical                            was performed, and patient medications and                            allergies were reviewed. The patient's tolerance of                            previous anesthesia was also reviewed. The risks                            and benefits of the procedure and the sedation                            options and risks were discussed with the patient.                            All questions were answered, and informed consent                            was obtained. Prior Anticoagulants: The patient has                            taken no previous anticoagulant or antiplatelet                            agents. ASA Grade Assessment: III - A patient with                            severe systemic disease. After reviewing the risks  and benefits, the patient was deemed in                            satisfactory condition to undergo the procedure.                           After obtaining informed consent, the endoscope was                            passed under direct vision. Throughout the                            procedure, the patient's blood pressure, pulse, and                            oxygen saturations were monitored continuously.  The                            Model GIF-[id number] scope was introduced                            through the mouth, and advanced to the second part                            of duodenum. The upper GI endoscopy was                            accomplished without difficulty. The patient                            tolerated the procedure well. Scope In: Scope Out: Findings:                 One moderate benign-appearing, intrinsic                            anastomotic stricture was found 20 cm from the                            incisors. This measured 1.4 cm (inner diameter). A                            TTS dilator was passed through the scope. Dilation                            with a 15-16.5-18 mm balloon dilator was performed                            to 18 mm.                           The exam of the esophageal remnant was otherwise                            normal.  The stomach was intrathoracic with retained                            semisolid contents.                           The examined duodenum was normal.                           The cardia and gastric fundus were normal on                            retroflexion given altered surgical anatomy. Complications:            No immediate complications. Estimated Blood Loss:     Estimated blood loss: none. Impression:               1. Status post esophagectomy                           2. Anastomotic stricture status post balloon                            dilation                           3. Iatrogenic gastroparesis. Recommendation:           - Patient has a contact number available for                            emergencies. The signs and symptoms of potential                            delayed complications were discussed with the                            patient. Return to normal activities tomorrow.                            Written discharge instructions were provided to the                             patient.                           - Post dilation diet.                           - Continue present medications.                           - Reflux precautions John N. Henrene Pastor, MD 01/27/2017 10:28:10 AM This report has been signed electronically.

## 2017-01-27 NOTE — Patient Instructions (Signed)
YOU HAD AN ENDOSCOPIC PROCEDURE TODAY AT [facility]:   Refer to the procedure report that was given to you for any specific questions about what was found during the examination.  If the procedure report does not answer your questions, please call your gastroenterologist to clarify.  If you requested that your care partner not be given the details of your procedure findings, then the procedure report has been included in a sealed envelope for you to review at your convenience later.  YOU SHOULD EXPECT: Some feelings of bloating in the abdomen. Passage of more gas than usual.  Walking can help get rid of the air that was put into your GI tract during the procedure and reduce the bloating. If you had a lower endoscopy (such as a colonoscopy or flexible sigmoidoscopy) you may notice spotting of blood in your stool or on the toilet paper. If you underwent a bowel prep for your procedure, you may not have a normal bowel movement for a few days.  Please Note:  You might notice some irritation and congestion in your nose or some drainage.  This is from the oxygen used during your procedure.  There is no need for concern and it should clear up in a day or so.  SYMPTOMS TO REPORT IMMEDIATELY:   Following upper endoscopy (EGD)  Vomiting of blood or coffee ground material  New chest pain or pain under the shoulder blades  Painful or persistently difficult swallowing  New shortness of breath  Fever of 100F or higher  Black, tarry-looking stools  For urgent or emergent issues, a gastroenterologist can be reached at any hour by calling [phone].   DIET: Please follow the dilatation diet the rest of the day.  Handout was given to your wife.  Drink plenty of fluids but you should avoid alcoholic beverages for 24 hours.  ACTIVITY:  You should plan to take it easy for the rest of today and you should NOT DRIVE or use heavy machinery until tomorrow (because of the sedation medicines used  during the test).    FOLLOW UP: Our staff will call the number listed on your records the next business day following your procedure to check on you and address any questions or concerns that you may have regarding the information given to you following your procedure. If we do not reach you, we will leave a message.  However, if you are feeling well and you are not experiencing any problems, there is no need to return our call.  We will assume that you have returned to your regular daily activities without incident.  If any biopsies were taken you will be contacted by phone or by letter within the next 1-3 weeks.  Please call us at [phone] if you have not heard about the biopsies in 3 weeks.    SIGNATURES/CONFIDENTIALITY: You and/or your care partner have signed paperwork which will be entered into your electronic medical record.  These signatures attest to the fact that that the information above on your After Visit Summary has been reviewed and is understood.  Full responsibility of the confidentiality of this discharge information lies with you and/or your care-partner.   Handouts were given to your care partner on the dilatation diet to follow the rest of today and GERD. You may resume your current medications today. Please call if any questions or concerns.

## 2017-01-27 NOTE — Progress Notes (Signed)
No problems noted on discharge from the  recovery room. maw

## 2017-01-27 NOTE — Progress Notes (Signed)
Called to room to assist during endoscopic procedure.  Patient ID and intended procedure confirmed with present staff. Received instructions for my participation in the procedure from the performing physician.  

## 2017-01-27 NOTE — Progress Notes (Signed)
Pt denied any sx of low glucose.  When pt was awake he was given oj to drink.maw

## 2017-01-27 NOTE — Progress Notes (Signed)
11:00 recheck sugar 87.  Pt drank 1 more 4 oz of o.j.  He denied any sx of low blood sugar now.  He said he was ready for discharge.  maw

## 2017-01-27 NOTE — Progress Notes (Signed)
Recheck sugar at 10:44 after O.J. And 70.  Pt will be given 2 glucose tabs po now.  maw

## 2017-01-28 ENCOUNTER — Telehealth: Payer: Self-pay

## 2017-01-28 NOTE — Telephone Encounter (Signed)
  Follow up Call-  Call back number 01/27/2017 01/31/2016 03/08/2015  Post procedure Call Back phone  # [phone]  Permission to leave phone message Yes Yes Yes  Some recent data might be hidden     Patient questions:  Do you have a fever, pain , or abdominal swelling? No. Pain Score  0 *  Have you tolerated food without any problems? Yes.    Have you been able to return to your normal activities? Yes.    Do you have any questions about your discharge instructions: Diet   No. Medications  No. Follow up visit  No.  Do you have questions or concerns about your Care? No.  Actions: * If pain score is 4 or above: No action needed, pain <4.

## 2017-01-30 ENCOUNTER — Encounter: Payer: Self-pay | Admitting: Cardiothoracic Surgery

## 2017-01-30 ENCOUNTER — Ambulatory Visit (INDEPENDENT_AMBULATORY_CARE_PROVIDER_SITE_OTHER): Payer: Medicare Other | Admitting: Cardiothoracic Surgery

## 2017-01-30 VITALS — BP 129/73 | HR 92 | Resp 20 | Ht 75.0 in | Wt 241.0 lb

## 2017-01-30 DIAGNOSIS — Z9049 Acquired absence of other specified parts of digestive tract: Secondary | ICD-10-CM | POA: Diagnosis not present

## 2017-01-30 DIAGNOSIS — Z9889 Other specified postprocedural states: Secondary | ICD-10-CM | POA: Diagnosis not present

## 2017-01-30 DIAGNOSIS — C159 Malignant neoplasm of esophagus, unspecified: Secondary | ICD-10-CM

## 2017-01-30 NOTE — Progress Notes (Signed)
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 SURGICAL PROCEDURE: Bronchoscopy, transhiatal total esophagectomy with cervical esophagogastrostomy, pyloromyotomy, and placement of jejunal feeding tube. SURGEON: Lanelle Bal, MD.  GE junction carcinoma [facility])   Staging form: Stomach, AJCC 7th Edition     Clinical: Stage IIB (T3, N1, M0) - Signed by Ladell Pier, MD on 03/28/2015   Staging form: Esophagus - Adenocarcinoma, AJCC 7th Edition     Pathologic stage from 07/26/2015: Stage IIIB (yT3, N2, cM0, G3 - Poorly differentiated) - Signed by [name], MD on 07/28/2015  History of Present Illness:  Patient returns to the office today one and a half years after transhiatal esophagectomy for stage IIIB poorly differentiated carcinoma the distal esophagus. The patient has been doing well, maintaining his weight, he denies any significant reflux symptoms. He has required intermittent esophageal dilatation of the cervical anastomosis, this was most recently done 4 days ago. Patient denies any dumping symptoms  In addition the patient had a known low risk Myoview stress test about 2 years ago. He now notes chest burning nonradiating with exertion, he differentiates this from reflux symptoms or difficulty swallowing.        Past Medical History:  Diagnosis Date  . Anemia 08/15/2009  . Anxiety   . Arthritis   . [name]'s esophagus 2007  . COLONIC POLYPS, ADENOMATOUS 08/01/2008, 2013, 2014  . COPD (chronic obstructive pulmonary  disease) (HCC)    no per pt  . Depression   . Diabetes type 2, controlled ([place]) 03/13/2007  . Diverticulitis   . ED (erectile dysfunction)   . EMPHYSEMA 11/08/2008   no per pt  . Esophageal cancer ([place]) "dx'd ~ 01/2015"  . GERD 03/13/2007  . GIB (gastrointestinal bleeding) 08/18/2015  . Hiatal hernia   . History of blood transfusion 08/18/2015   due to GIB  . HYPERCHOLESTEROLEMIA 02/03/2008  . HYPERTENSION 03/13/2007   pt denies, claims white coat syndrome  . Prostatism   . PULMONARY NODULE 11/24/2008  . Stomach cancer ([place]) "dx'd ~ 01/2015"  . TOBACCO ABUSE 11/24/2008     History  Smoking Status  . Former Smoker  . Packs/day: 1.00  . Years: 35.00  . Types: Cigarettes  . Quit date: 10/31/2008  Smokeless Tobacco  . Never Used    History  Alcohol Use No     No Known Allergies  Current Outpatient Prescriptions  Medication Sig Dispense Refill  . atorvastatin (LIPITOR) 10 MG tablet Take 1 tablet by mouth  every day 90 tablet 3  . diazepam (VALIUM) 5 MG tablet Take 1 tablet (5 mg total) by mouth 2 (two) times daily as needed for anxiety. 180 tablet 1  . glucose blood (PRECISION XTRA TEST STRIPS) test strip Check blood sugar three times a day dx 250.01 270 each 3  . insulin NPH Human (HUMULIN N) 100 UNIT/ML injection Inject 3.3 mLs (330 Units total) into the skin every morning. 11 vial 11  . Insulin Syringe-Needle U-100 30G X 3/8" 1 ML MISC Use to  inject insulin 3 times daily. 300 each 1   Current Facility-Administered Medications  Medication Dose Route Frequency Provider Last Rate Last Dose  . 0.9 %  sodium chloride infusion  500 mL Intravenous Continuous Irene Shipper, MD        Wt Readings from Last 3 Encounters:  01/30/17 241 lb (109.3 kg)  01/27/17 236 lb (107 kg)  12/31/16 236 lb (107 kg)   Filed Weights   01/30/17 1030  Weight: 241 lb (109.3 kg)    Physical Exam: BP 129/73   Pulse 92   Resp 20   Ht 6\' 3"  (1.905 m)   Wt 241 lb (109.3 kg)   SpO2 94% Comment: RA   BMI 30.12 kg/m   General appearance: alert and cooperative, Neurologic: intact Heart: Regular rate and rhythm normal S1 and S2 no murmur or gallop Lungs: Lungs are clear Abdomen: Midline abdominal incision is healed has no palpable masses. Extremities: Patient has no evidence of DVT no pedal edema DP and PT pulses are intact  Wound: Neck and abdominal incisions are well-healed without evidence of hernia  Patient has no cervical or supraclavicular adenopathy, no axillary adenopathy   Diagnostic Studies & Laboratory data:     Recent Radiology Findings:    Recent Lab Findings: Lab Results  Component Value Date   WBC 6.2 10/22/2016   HGB 12.4 (L) 10/22/2016   HCT 36.7 (L) 10/22/2016   PLT 128 (L) 10/22/2016   GLUCOSE 199 (H) 10/28/2016   CHOL 142 10/28/2016   TRIG 177.0 (H) 10/28/2016   HDL 32.90 (L) 10/28/2016   LDLDIRECT 93.0 12/02/2014   LDLCALC 74 10/28/2016   ALT 13 10/28/2016   AST 15 10/28/2016   NA 141 10/28/2016   K 4.3 10/28/2016   CL 102 10/28/2016   CREATININE 1.11 10/28/2016   BUN 17 10/28/2016   CO2 32 10/28/2016   TSH 2.53 10/28/2016   INR 1.06 08/19/2015   HGBA1C 8.6 12/31/2016    Wt Readings from Last 3 Encounters:  01/30/17 241 lb (109.3 kg)  01/27/17 236 lb (107 kg)  12/31/16 236 lb (107 kg)     Assessment / Plan:   Patient without x-ray or endoscopy or as exam evidence of recurrence now approximately 18 months after transhiatal esophagectomy for stage III esophageal carcinoma the distal esophagus With his vague symptoms of "heartburn" with exertion the patient will record turn to see his cardiologist Dr. Stanford Breed, a stress test done approximately 2 years ago was low risk,  did have some decrease in LV function noted. I plan to see the patient back in 6 months  [name] MD      [address] [place] [address] Office [phone]   Beeper [phone]  01/30/2017 11:03 AM

## 2017-03-03 ENCOUNTER — Ambulatory Visit (INDEPENDENT_AMBULATORY_CARE_PROVIDER_SITE_OTHER): Payer: Medicare Other | Admitting: Endocrinology

## 2017-03-03 ENCOUNTER — Telehealth: Payer: Self-pay | Admitting: Endocrinology

## 2017-03-03 ENCOUNTER — Encounter: Payer: Self-pay | Admitting: Endocrinology

## 2017-03-03 VITALS — BP 116/82 | HR 86 | Ht 75.0 in | Wt 243.0 lb

## 2017-03-03 DIAGNOSIS — E1151 Type 2 diabetes mellitus with diabetic peripheral angiopathy without gangrene: Secondary | ICD-10-CM | POA: Diagnosis not present

## 2017-03-03 DIAGNOSIS — Z794 Long term (current) use of insulin: Secondary | ICD-10-CM

## 2017-03-03 LAB — POCT GLYCOSYLATED HEMOGLOBIN (HGB A1C): Hemoglobin A1C: 8.2

## 2017-03-03 MED ORDER — DIAZEPAM 5 MG PO TABS: 5.0000 mg | ORAL_TABLET | Freq: Two times a day (BID) | ORAL | 1 refills | Status: DC | PRN

## 2017-03-03 MED ORDER — INSULIN ISOPHANE & REGULAR (HUMAN 70-30)100 UNIT/ML KWIKPEN: 250.0000 [IU] | PEN_INJECTOR | Freq: Every day | SUBCUTANEOUS | 11 refills | Status: DC

## 2017-03-03 MED ORDER — SILDENAFIL CITRATE 20 MG PO TABS: ORAL_TABLET | ORAL | 11 refills | Status: DC

## 2017-03-03 NOTE — Progress Notes (Signed)
 Subjective:    Patient ID: Ronald Johnson, male    DOB: 12/14/1945, 71 y.o.   MRN: [id number]  HPI Pt returns for f/u of diabetes mellitus: DM type: Insulin-requiring type 2.  Dx'ed: 1993 Complications: polyneuropathy, PAD, and nephropathy.  Therapy: insulin since 2004.  DKA: never.  Severe hypoglycemia: never.   Pancreatitis: never.   Other: he changed to qd insulin, after he did not achieve good control on multiple daily injections; he cannot afford insulin analogs; insulin requirement has decreased since cancer dx.  Interval history: no cbg record, but states cbg's vary from 50-200's.  It is in general higher as the day goes on.  He takes NPH, 300 units qam.  pt states he feels well in general, except for ongoing heartburn.   Past Medical History:  Diagnosis Date  . Anemia 08/15/2009  . Anxiety   . Arthritis   . [name]'s esophagus 2007  . COLONIC POLYPS, ADENOMATOUS 08/01/2008, 2013, 2014  . COPD (chronic obstructive pulmonary disease) (HCC)    no per pt  . Depression   . Diabetes type 2, controlled (HCC) 03/13/2007  . Diverticulitis   . ED (erectile dysfunction)   . EMPHYSEMA 11/08/2008   no per pt  . Esophageal cancer (HCC) "dx'd ~ 01/2015"  . GERD 03/13/2007  . GIB (gastrointestinal bleeding) 08/18/2015  . Hiatal hernia   . History of blood transfusion 08/18/2015   due to GIB  . HYPERCHOLESTEROLEMIA 02/03/2008  . HYPERTENSION 03/13/2007   pt denies, claims white coat syndrome  . Prostatism   . PULMONARY NODULE 11/24/2008  . Stomach cancer (HCC) "dx'd ~ 01/2015"  . TOBACCO ABUSE 11/24/2008    Past Surgical History:  Procedure Laterality Date  . BALLOON DILATION N/A 03/19/2016   Procedure: BALLOON DILATION;  Surgeon: John N Perry, MD;  Location: [facility];  Service: Endoscopy;  Laterality: N/A;  . CIRCUMCISION    . COLONOSCOPY    . COMPLETE ESOPHAGECTOMY N/A 07/25/2015   Procedure: TRANSHIATAL TOTAL ESOPHAGECTOMY COMPLETE PYLOROMYOTOMY;  Surgeon: Edward B Gerhardt, MD;   Location: [facility] OR;  Service: Thoracic;  Laterality: N/A;  . EGD  11/19/2005  . ESOPHAGOGASTRODUODENOSCOPY N/A 03/19/2016   Procedure: ESOPHAGOGASTRODUODENOSCOPY (EGD);  Surgeon: John N Perry, MD;  Location: [facility];  Service: Endoscopy;  Laterality: N/A;  . EUS N/A 03/16/2015   Procedure: UPPER ENDOSCOPIC ULTRASOUND (EUS) RADIAL;  Surgeon: Daniel P Jacobs, MD;  Location: [facility];  Service: Endoscopy;  Laterality: N/A;  . FOOT FRACTURE SURGERY Right 1980   right foot w/ pins and screws  . JEJUNOSTOMY N/A 07/25/2015   Procedure: FEEDING JEJUNOSTOMY;  Surgeon: Edward B Gerhardt, MD;  Location: [facility] OR;  Service: Thoracic;  Laterality: N/A;  . POLYPECTOMY    . removal pins and screws foot  1980   right foot  . UPPER GASTROINTESTINAL ENDOSCOPY    . VASECTOMY    . VIDEO BRONCHOSCOPY N/A 07/25/2015   Procedure: VIDEO BRONCHOSCOPY;  Surgeon: Edward B Gerhardt, MD;  Location: [facility];  Service: Thoracic;  Laterality: N/A;    Social History   Social History  . Marital status: Married    Spouse name: N/A  . Number of children: 2  . Years of education: N/A   Occupational History  . retired [employer]    works inspecting pipelines   Social History Main Topics  . Smoking status: Former Smoker    Packs/day: 1.00    Years: 35.00    Types: Cigarettes    Quit date:   10/31/2008  . Smokeless tobacco: Never Used  . Alcohol use No  . Drug use: No  . Sexual activity: Not Currently   Other Topics Concern  . Not on file   Social History Narrative   Married, wife Maryann with #2 grown children   Prior [employer], where he met his wife in [place]   Retired-prior work pipeline inspection    Independent ADL's    Current Outpatient Prescriptions on File Prior to Visit  Medication Sig Dispense Refill  . atorvastatin (LIPITOR) 10 MG tablet Take 1 tablet by mouth  every day 90 tablet 3  . glucose blood (PRECISION XTRA TEST STRIPS) test strip Check blood sugar three times a day dx 250.01 270 each  3  . Insulin Syringe-Needle U-100 30G X 3/8" 1 ML MISC Use to inject insulin 3 times daily. 300 each 1  . [DISCONTINUED] metoprolol succinate (TOPROL-XL) 25 MG 24 hr tablet Take 25 mg by mouth daily.      Current Facility-Administered Medications on File Prior to Visit  Medication Dose Route Frequency Provider Last Rate Last Dose  . 0.9 %  sodium chloride infusion  500 mL Intravenous Continuous Perry, John N, MD        No Known Allergies  Family History  Problem Relation Age of Onset  . Stroke Mother   . Diabetes Mother   . Diabetes Maternal Grandmother        mother side of the family aunts, MGF  . Breast cancer Paternal Grandmother   . Colon cancer Neg Hx   . Esophageal cancer Neg Hx   . Rectal cancer Neg Hx   . Stomach cancer Neg Hx     BP 116/82   Pulse 86   Ht 6' 3" (1.905 m)   Wt 243 lb (110.2 kg)   SpO2 92%   BMI 30.37 kg/m    Review of Systems Denies LOC    Objective:   Physical Exam VITAL SIGNS:  See vs page GENERAL: no distress Pulses: foot pulses are intact bilaterally.   MSK: no deformity of the feet or ankles.  CV: no edema of the legs or ankles Skin:  no ulcer on the feet or ankles.  normal color and temp on the feet and ankles Neuro: sensation is intact to touch on the feet and ankles.    Lab Results  Component Value Date   HGBA1C 8.2 03/03/2017      Assessment & Plan:  Insulin-requiring type 2 DM, with PAD: The pattern of his cbg's indicates he needs a faster-acting qd insulin.   Patient Instructions  Please change the insulin to "70/30," 250 units each morning.  On this type of insulin schedule, you should eat meals on a regular schedule.  If a meal is missed or significantly delayed, your blood sugar could go low.  check your blood sugar twice a day.  vary the time of day when you check, between before the 3 meals, and at bedtime.  also check if you have symptoms of your blood sugar being too high or too low.  please keep a record of the  readings and bring it to your next appointment here (or you can bring the meter itself).  You can write it on any piece of paper.  please call us sooner if your blood sugar goes below 70, or if you have a lot of readings over 200.   Please come back for a follow-up appointment in 2 months.       

## 2017-03-03 NOTE — Telephone Encounter (Signed)
See message and please advise, Thanks!  

## 2017-03-03 NOTE — Patient Instructions (Addendum)
Please change the insulin to "70/30," 250 units each morning.  On this type of insulin schedule, you should eat meals on a regular schedule.  If a meal is missed or significantly delayed, your blood sugar could go low.  check your blood sugar twice a day.  vary the time of day when you check, between before the 3 meals, and at bedtime.  also check if you have symptoms of your blood sugar being too high or too low.  please keep a record of the readings and bring it to your next appointment here (or you can bring the meter itself).  You can write it on any piece of paper.  please call us sooner if your blood sugar goes below 70, or if you have a lot of readings over 200.   Please come back for a follow-up appointment in 2 months.

## 2017-03-03 NOTE — Telephone Encounter (Signed)
Patient's wife notified rx requested has been submitted to [facility].

## 2017-03-03 NOTE — Telephone Encounter (Signed)
I am putting in basket. Please send to [facility]

## 2017-03-03 NOTE — Telephone Encounter (Signed)
Patient wanted script for diazapam to go to [facility] and wanted to know if it was sent to [facility] by accident.  Thank you,  -LL

## 2017-03-04 ENCOUNTER — Telehealth: Payer: Self-pay | Admitting: Endocrinology

## 2017-03-04 NOTE — Telephone Encounter (Signed)
Patient called to advise that [facility] faxed in a form yesterday to help cover Insulin Isophane & Regular Human (HUMULIN 70/30 MIX) (70-30) 100 UNIT/ML PEN [[id number]] . He is calling to follow up on this. Verified home # is best

## 2017-03-05 MED ORDER — INSULIN NPH ISOPHANE & REGULAR (70-30) 100 UNIT/ML ~~LOC~~ SUSP: 250.0000 [IU] | Freq: Every day | SUBCUTANEOUS | 11 refills | Status: DC

## 2017-03-05 NOTE — Telephone Encounter (Signed)
Humulin 70/30 not covered. Ok to send novolin 70/30?

## 2017-03-05 NOTE — Telephone Encounter (Signed)
Refill submitted. 

## 2017-03-05 NOTE — Telephone Encounter (Signed)
ok 

## 2017-03-24 ENCOUNTER — Telehealth: Payer: Self-pay | Admitting: Endocrinology

## 2017-03-24 NOTE — Telephone Encounter (Signed)
I need a diagnosis for referral.  Also there is only a psychologist not psychiatrist in our group

## 2017-03-24 NOTE — Telephone Encounter (Signed)
Patient requesting a referral to see a psychiatrist within the [facility] team. Call patient to advise.

## 2017-03-25 ENCOUNTER — Other Ambulatory Visit: Payer: Self-pay | Admitting: Endocrinology

## 2017-03-25 DIAGNOSIS — F418 Other specified anxiety disorders: Secondary | ICD-10-CM

## 2017-03-25 DIAGNOSIS — H6123 Impacted cerumen, bilateral: Secondary | ICD-10-CM | POA: Diagnosis not present

## 2017-03-25 NOTE — Telephone Encounter (Signed)
This is done.

## 2017-03-25 NOTE — Telephone Encounter (Signed)
He is requesting to see a psychiatrist not a psychologist his dx is as stated in the problem list according to the pt depression with anxiety

## 2017-03-27 ENCOUNTER — Other Ambulatory Visit: Payer: Self-pay | Admitting: Endocrinology

## 2017-03-27 ENCOUNTER — Other Ambulatory Visit: Payer: Self-pay | Admitting: Internal Medicine

## 2017-03-27 DIAGNOSIS — K222 Esophageal obstruction: Secondary | ICD-10-CM

## 2017-03-27 DIAGNOSIS — R1310 Dysphagia, unspecified: Secondary | ICD-10-CM

## 2017-04-24 ENCOUNTER — Ambulatory Visit (HOSPITAL_BASED_OUTPATIENT_CLINIC_OR_DEPARTMENT_OTHER): Payer: Medicare Other | Admitting: Oncology

## 2017-04-24 ENCOUNTER — Telehealth: Payer: Self-pay | Admitting: Oncology

## 2017-04-24 VITALS — BP 121/70 | HR 101 | Temp 98.0°F | Resp 18 | Ht 75.0 in | Wt 243.9 lb

## 2017-04-24 DIAGNOSIS — C160 Malignant neoplasm of cardia: Secondary | ICD-10-CM | POA: Diagnosis not present

## 2017-04-24 DIAGNOSIS — E119 Type 2 diabetes mellitus without complications: Secondary | ICD-10-CM

## 2017-04-24 NOTE — Progress Notes (Signed)
  Ronald Johnson OFFICE PROGRESS NOTE   Diagnosis: Gastroesophageal cancer  INTERVAL HISTORY:   Ronald Johnson returns for a scheduled visit. He reports significant improvement in reflux symptoms. No dysphagia when he eats small portions. Good energy level. Good appetite. He has noted a nodule at the left lower leg.  Objective:  Vital signs in last 24 hours:  Blood pressure 121/70, pulse (!) 101, temperature 98 F (36.7 C), temperature source Oral, resp. rate 18, height 6\' 3"  (1.905 m), weight 243 lb 14.4 oz (110.6 kg), SpO2 96 %.    HEENT: Neck without mass Lymphatics: No cervical, supraclavicular, or axillary nodes. 1 cm mobile nodule in proximity to the right submandibular gland (he reports this has been present since childhood) Resp: Distant breath sounds, no respiratory distress Cardio: Regular rate and rhythm GI: No hepatosplenomegaly, no mass, nontender Vascular: No leg edema  Skin: 0.5 cm oval raised tan lesion with irregular border at the left lower leg    Medications: I have reviewed the patient's current medications.  Assessment/Plan: 1. 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 EUS 03/16/2015 confirmed a clinical stage IIb (uT3,uN1) tumor ? Staging CTs of the chest, abdomen, and pelvis with no evidence of metastatic disease ? Initiation of radiation 04/19/2015, cycle 1 Taxol/carboplatin 04/20/2015: Radiation completed 05/29/2015; the fifth and final week of Taxol/carboplatin 05/18/2015 ? Restaging CTs 07/20/2015 revealed no evidence of metastatic disease, incidental left lower lobe pulmonary embolism ? Transhiatal total esophagectomy 07/25/2015 confirmed a ypT3, ypN2 with 4/5 positive lymph nodes, lymphovascular invasion, perineural invasion, negative resection margins  2. Postprandial subxiphoid pain secondary to #1  3. Diabetes-elevated blood sugar readings at the [facility] and at home  4.  COPD  5. Depression-Improved with Wellbutrin  6. Hypertension  7. History of adenomatous colon polyps  8. History of diabetic related neuropathy, peripheral arterial disease, and nephropathy  9. Nausea-likely related to radiation esophagitis/gastritis  10. Incidental pulmonary embolism noted on the CT 07/20/2015, treated with Xarelto  11. Admission 08/18/2015 with GI bleeding while on Xarelto, anticoagulation therapy discontinued  12. Dysphagia-improved with esophageal dilatation procedures by Dr. Henrene Pastor  13. History of Mild thrombocytopenia  14. CT scan abdomen/pelvis 10/09/2016 (done to evaluate persistent nausea, epigastric pain)-no acute findings or explanation for the patient's symptoms. Resolving postsurgical changes from presumed previous esophagectomy and gastric pull-through. No evidence of metastatic disease.     Disposition:  Ronald Johnson remains in clinical remission from gastroesophageal cancer. He will return for an office visit in 6 months. I recommended he schedule an appointment with dermatology to evaluate the nodular lesion at the left lower leg.  He will see Dr. Servando Snare in December.  15 minutes were spent with the patient today. The majority of the time was used for counseling and coordination of care.  Donneta Romberg, MD  04/24/2017  8:38 AM

## 2017-04-24 NOTE — Telephone Encounter (Signed)
Gave patient AVS and calendar of upcoming March appointments.  °

## 2017-04-29 DIAGNOSIS — L820 Inflamed seborrheic keratosis: Secondary | ICD-10-CM | POA: Diagnosis not present

## 2017-04-30 ENCOUNTER — Other Ambulatory Visit: Payer: Self-pay | Admitting: Physician Assistant

## 2017-05-05 ENCOUNTER — Ambulatory Visit (INDEPENDENT_AMBULATORY_CARE_PROVIDER_SITE_OTHER): Payer: Medicare Other | Admitting: Endocrinology

## 2017-05-05 ENCOUNTER — Encounter: Payer: Self-pay | Admitting: Endocrinology

## 2017-05-05 VITALS — BP 138/90 | HR 98 | Wt 243.8 lb

## 2017-05-05 DIAGNOSIS — Z794 Long term (current) use of insulin: Secondary | ICD-10-CM | POA: Diagnosis not present

## 2017-05-05 DIAGNOSIS — D649 Anemia, unspecified: Secondary | ICD-10-CM | POA: Diagnosis not present

## 2017-05-05 DIAGNOSIS — R251 Tremor, unspecified: Secondary | ICD-10-CM | POA: Diagnosis not present

## 2017-05-05 DIAGNOSIS — E1151 Type 2 diabetes mellitus with diabetic peripheral angiopathy without gangrene: Secondary | ICD-10-CM

## 2017-05-05 LAB — POCT GLYCOSYLATED HEMOGLOBIN (HGB A1C): Hemoglobin A1C: 7.4

## 2017-05-05 MED ORDER — INSULIN NPH ISOPHANE & REGULAR (70-30) 100 UNIT/ML ~~LOC~~ SUSP: 240.0000 [IU] | Freq: Every day | SUBCUTANEOUS | 11 refills | Status: DC

## 2017-05-05 NOTE — Progress Notes (Signed)
Subjective:    Patient ID: Ronald Johnson, male    DOB: 1946-06-04, 71 y.o.   MRN: [id number]  HPI Pt returns for f/u of diabetes mellitus: DM type: Insulin-requiring type 2.  Dx'ed: 1993 Complications: polyneuropathy, PAD, and renal insuff.  Therapy: insulin since 2004.  DKA: never.  Severe hypoglycemia: never.   Pancreatitis: never.   Other: he changed to qd insulin, after he did not achieve good control on multiple daily injections; he cannot afford insulin analogs; insulin requirement has decreased since cancer dx; he change to QAM 70/30, due to pattern of cbg's Interval history: no cbg record, but states cbg's vary from 60-300.  It is in general higher as the day goes on.   pt states he feels well in general. Past Medical History:  Diagnosis Date  . Anemia 08/15/2009  . Anxiety   . Arthritis   . [name]'s esophagus 2007  . COLONIC POLYPS, ADENOMATOUS 08/01/2008, 2013, 2014  . COPD (chronic obstructive pulmonary disease) (HCC)    no per pt  . Depression   . Diabetes type 2, controlled (HCC) 03/13/2007  . Diverticulitis   . ED (erectile dysfunction)   . EMPHYSEMA 11/08/2008   no per pt  . Esophageal cancer (HCC) "dx'd ~ 01/2015"  . GERD 03/13/2007  . GIB (gastrointestinal bleeding) 08/18/2015  . Hiatal hernia   . History of blood transfusion 08/18/2015   due to GIB  . HYPERCHOLESTEROLEMIA 02/03/2008  . HYPERTENSION 03/13/2007   pt denies, claims white coat syndrome  . Prostatism   . PULMONARY NODULE 11/24/2008  . Stomach cancer (HCC) "dx'd ~ 01/2015"  . TOBACCO ABUSE 11/24/2008    Past Surgical History:  Procedure Laterality Date  . BALLOON DILATION N/A 03/19/2016   Procedure: BALLOON DILATION;  Surgeon: Hilarie Fredrickson, MD;  Location: [facility];  Service: Endoscopy;  Laterality: N/A;  . CIRCUMCISION    . COLONOSCOPY    . COMPLETE ESOPHAGECTOMY N/A 07/25/2015   Procedure: TRANSHIATAL TOTAL ESOPHAGECTOMY COMPLETE PYLOROMYOTOMY;  Surgeon: Delight Ovens, MD;  Location[facility]  OR;  Service: Thoracic;  Laterality: N/A;  . EGD  11/19/2005  . ESOPHAGOGASTRODUODENOSCOPY N/A 03/19/2016   Procedure: ESOPHAGOGASTRODUODENOSCOPY (EGD);  Surgeon: Hilarie Fredrickson, MD;  Location: [facility];  Service: Endoscopy;  Laterality: N/A;  . EUS N/A 03/16/2015   Procedure: UPPER ENDOSCOPIC ULTRASOUND (EUS) RADIAL;  Surgeon: Rachael Fee, MD;  Location: [facility];  Service: Endoscopy;  Laterality: N/A;  . FOOT FRACTURE SURGERY Right 1980   right foot w/ pins and screws  . JEJUNOSTOMY N/A 07/25/2015   Procedure: FEEDING JEJUNOSTOMY;  Surgeon: Delight Ovens, MD;  Location: [facility] OR;  Service: Thoracic;  Laterality: N/A;  . POLYPECTOMY    . removal pins and screws foot  1980   right foot  . UPPER GASTROINTESTINAL ENDOSCOPY    . VASECTOMY    . VIDEO BRONCHOSCOPY N/A 07/25/2015   Procedure: VIDEO BRONCHOSCOPY;  Surgeon: Delight Ovens, MD;  Location: [facility] OR;  Service: Thoracic;  Laterality: N/A;    Social History   Social History  . Marital status: Married    Spouse name: N/A  . Number of children: 2  . Years of education: N/A   Occupational History  . retired [employer]    works Contractor   Social History Main Topics  . Smoking status: Former Smoker    Packs/day: 1.00    Years: 35.00    Types: Cigarettes    Quit date: 10/31/2008  .  Smokeless tobacco: Never Used  . Alcohol use No  . Drug use: No  . Sexual activity: Not Currently   Other Topics Concern  . 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 atorvastatin (LIPITOR) 10 MG tablet Take 1 tablet by mouth  every day 90 tablet 3  . diazepam (VALIUM) 5 MG tablet Take 1 tablet (5 mg total) by mouth 2 (two) times daily as needed for anxiety. 180 tablet 1    . escitalopram (LEXAPRO) 10 MG tablet TAKE 1 TABLET BY MOUTH  DAILY 90 tablet 2  . glucose blood (PRECISION XTRA TEST STRIPS) test strip Check blood sugar three times a day dx 250.01 270 each 3  . Insulin Syringe-Needle U-100 30G X 3/8" 1 ML MISC Use to inject insulin 3 times daily. 300 each 1  . omeprazole (PRILOSEC) 40 MG capsule TAKE 1 CAPSULE(40 MG) BY MOUTH TWICE DAILY 60 capsule 6  . sildenafil (REVATIO) 20 MG tablet 1-5 tabs as needed for ED symptoms 100 tablet 11  . RABEPRAZOLE SODIUM PO Take by mouth.    . [DISCONTINUED] metoprolol succinate (TOPROL-XL) 25 MG 24 hr tablet Take 25 mg by mouth daily.      Current Facility-Administered Medications on File Prior to Visit  Medication Dose Route Frequency Provider Last Rate Last Dose  . 0.9 %  sodium chloride infusion  500 mL Intravenous Continuous Irene Shipper, MD        No Known Allergies  Family History  Problem Relation Age of Onset  . Stroke Mother   . Diabetes Mother   . Diabetes Maternal Grandmother        mother side of the family aunts, MGF  . Breast cancer Paternal Grandmother   . Colon cancer Neg Hx   . Esophageal cancer Neg Hx   . Rectal cancer Neg Hx   . Stomach cancer Neg Hx     BP 138/90   Pulse 98   Wt 243 lb 12.8 oz (110.6 kg)   SpO2 94%   BMI 30.47 kg/m    Review of Systems He has slight tremor of the hands (he says cbg is normal then).  Denies fever.      Objective:   Physical Exam VITAL SIGNS:  See vs page GENERAL: no distress Pulses: foot pulses are intact bilaterally.   MSK: no deformity of the feet or ankles.  CV: no edema of the legs or ankles Skin:  no ulcer on the feet or ankles.  normal color and temp on the feet and ankles Neuro: sensation is intact to touch on the feet and ankles.     A1c=7.4%    Assessment & Plan:  Insulin-requiring type 2 DM, with PAD: slightly overcontrolled, given this regimen, which does match insulin to her changing needs throughout the day.  We could  change to humalog 50/50, but he says he cannot afford.  Renal insuff: this is the likely reason for the cbg pattern.   Tremor: new, uncertain etiology Anemia: due for recheck.   Patient Instructions  Please reduce the "70/30," insulin to 240 units each morning.  On this type of insulin schedule, you should eat meals on a regular schedule.  If a meal  is missed or significantly delayed, your blood sugar could go low.  check your blood sugar twice a day.  vary the time of day when you check, between before the 3 meals, and at bedtime.  also check if you have symptoms of your blood sugar being too high or too low.  please keep a record of the readings and bring it to your next appointment here (or you can bring the meter itself).  You can write it on any piece of paper.  please call us sooner if your blood sugar goes below 70, or if you have a lot of readings over 200.   blood tests are requested for you today.  We'll let you know about the results. Call if you want to have the tremor checked by a specialist.  Please come back for a follow-up appointment in 2-3 months.

## 2017-05-05 NOTE — Patient Instructions (Addendum)
Please reduce the "70/30," insulin to 240 units each morning.  On this type of insulin schedule, you should eat meals on a regular schedule.  If a meal is missed or significantly delayed, your blood sugar could go low.  check your blood sugar twice a day.  vary the time of day when you check, between before the 3 meals, and at bedtime.  also check if you have symptoms of your blood sugar being too high or too low.  please keep a record of the readings and bring it to your next appointment here (or you can bring the meter itself).  You can write it on any piece of paper.  please call us sooner if your blood sugar goes below 70, or if you have a lot of readings over 200.   blood tests are requested for you today.  We'll let you know about the results. Call if you want to have the tremor checked by a specialist.  Please come back for a follow-up appointment in 2-3 months.

## 2017-05-06 ENCOUNTER — Other Ambulatory Visit (INDEPENDENT_AMBULATORY_CARE_PROVIDER_SITE_OTHER): Payer: Medicare Other

## 2017-05-06 DIAGNOSIS — R251 Tremor, unspecified: Secondary | ICD-10-CM

## 2017-05-06 DIAGNOSIS — D649 Anemia, unspecified: Secondary | ICD-10-CM

## 2017-05-06 LAB — CBC WITH DIFFERENTIAL/PLATELET
BASOS ABS: 0 10*3/uL (ref 0.0–0.1)
Basophils Relative: 0.5 % (ref 0.0–3.0)
EOS ABS: 0.2 10*3/uL (ref 0.0–0.7)
Eosinophils Relative: 3 % (ref 0.0–5.0)
HEMATOCRIT: 41.6 % (ref 39.0–52.0)
Hemoglobin: 13.5 g/dL (ref 13.0–17.0)
LYMPHS ABS: 2.1 10*3/uL (ref 0.7–4.0)
Lymphocytes Relative: 31.7 % (ref 12.0–46.0)
MCHC: 32.5 g/dL (ref 30.0–36.0)
MCV: 96.7 fl (ref 78.0–100.0)
Monocytes Absolute: 0.3 10*3/uL (ref 0.1–1.0)
Monocytes Relative: 4.4 % (ref 3.0–12.0)
NEUTROS ABS: 3.9 10*3/uL (ref 1.4–7.7)
NEUTROS PCT: 60.4 % (ref 43.0–77.0)
PLATELETS: 167 10*3/uL (ref 150.0–400.0)
RBC: 4.3 Mil/uL (ref 4.22–5.81)
RDW: 13.7 % (ref 11.5–15.5)
WBC: 6.5 10*3/uL (ref 4.0–10.5)

## 2017-05-06 LAB — IBC PANEL
Iron: 135 ug/dL (ref 42–165)
SATURATION RATIOS: 35.8 % (ref 20.0–50.0)
TRANSFERRIN: 269 mg/dL (ref 212.0–360.0)

## 2017-05-06 LAB — TSH: TSH: 1.95 u[IU]/mL (ref 0.35–4.50)

## 2017-05-12 ENCOUNTER — Other Ambulatory Visit: Payer: Self-pay | Admitting: Physician Assistant

## 2017-05-12 NOTE — Telephone Encounter (Signed)
Ronald Johnson the pt had a procedure on 01/27/17 with Dr Henrene Pastor.

## 2017-05-27 ENCOUNTER — Encounter: Payer: Self-pay | Admitting: Neurology

## 2017-05-27 ENCOUNTER — Telehealth: Payer: Self-pay | Admitting: Endocrinology

## 2017-05-27 DIAGNOSIS — R251 Tremor, unspecified: Secondary | ICD-10-CM

## 2017-05-27 NOTE — Telephone Encounter (Signed)
Pt is asking for a referral to a tremor specialist please

## 2017-05-27 NOTE — Telephone Encounter (Signed)
Notified patient that referral was sent in.

## 2017-05-27 NOTE — Telephone Encounter (Signed)
done

## 2017-06-03 ENCOUNTER — Telehealth: Payer: Self-pay | Admitting: Endocrinology

## 2017-06-03 NOTE — Telephone Encounter (Signed)
Verbal order for Diazepam 5mg  [facility] [phone]  Ref # [id number]

## 2017-06-04 ENCOUNTER — Other Ambulatory Visit: Payer: Self-pay | Admitting: Endocrinology

## 2017-06-04 NOTE — Telephone Encounter (Signed)
I have filled out [facility] form & faxing ir back. Tried to call patient to let him know it was being faxed but received no answer.

## 2017-06-05 DIAGNOSIS — J0110 Acute frontal sinusitis, unspecified: Secondary | ICD-10-CM | POA: Diagnosis not present

## 2017-06-09 DIAGNOSIS — R51 Headache: Secondary | ICD-10-CM | POA: Diagnosis not present

## 2017-06-09 DIAGNOSIS — R1310 Dysphagia, unspecified: Secondary | ICD-10-CM | POA: Diagnosis not present

## 2017-06-09 DIAGNOSIS — Z794 Long term (current) use of insulin: Secondary | ICD-10-CM | POA: Diagnosis not present

## 2017-06-09 DIAGNOSIS — I517 Cardiomegaly: Secondary | ICD-10-CM | POA: Diagnosis not present

## 2017-06-09 DIAGNOSIS — R930 Abnormal findings on diagnostic imaging of skull and head, not elsewhere classified: Secondary | ICD-10-CM | POA: Diagnosis not present

## 2017-06-09 DIAGNOSIS — Z79899 Other long term (current) drug therapy: Secondary | ICD-10-CM | POA: Diagnosis not present

## 2017-06-09 DIAGNOSIS — E119 Type 2 diabetes mellitus without complications: Secondary | ICD-10-CM | POA: Diagnosis not present

## 2017-06-09 DIAGNOSIS — R7989 Other specified abnormal findings of blood chemistry: Secondary | ICD-10-CM | POA: Diagnosis not present

## 2017-06-09 DIAGNOSIS — R269 Unspecified abnormalities of gait and mobility: Secondary | ICD-10-CM | POA: Diagnosis not present

## 2017-06-09 DIAGNOSIS — F419 Anxiety disorder, unspecified: Secondary | ICD-10-CM | POA: Diagnosis not present

## 2017-06-09 DIAGNOSIS — R4182 Altered mental status, unspecified: Secondary | ICD-10-CM | POA: Diagnosis not present

## 2017-06-09 DIAGNOSIS — Z5181 Encounter for therapeutic drug level monitoring: Secondary | ICD-10-CM | POA: Diagnosis not present

## 2017-06-09 DIAGNOSIS — R410 Disorientation, unspecified: Secondary | ICD-10-CM | POA: Diagnosis not present

## 2017-06-09 DIAGNOSIS — R531 Weakness: Secondary | ICD-10-CM | POA: Diagnosis not present

## 2017-06-09 DIAGNOSIS — Z87891 Personal history of nicotine dependence: Secondary | ICD-10-CM | POA: Diagnosis not present

## 2017-06-09 DIAGNOSIS — I214 Non-ST elevation (NSTEMI) myocardial infarction: Secondary | ICD-10-CM | POA: Diagnosis not present

## 2017-06-09 DIAGNOSIS — J329 Chronic sinusitis, unspecified: Secondary | ICD-10-CM | POA: Diagnosis not present

## 2017-06-09 DIAGNOSIS — R740 Nonspecific elevation of levels of transaminase and lactic acid dehydrogenase [LDH]: Secondary | ICD-10-CM | POA: Diagnosis not present

## 2017-06-09 DIAGNOSIS — Z23 Encounter for immunization: Secondary | ICD-10-CM | POA: Diagnosis not present

## 2017-06-10 DIAGNOSIS — Z8673 Personal history of transient ischemic attack (TIA), and cerebral infarction without residual deficits: Secondary | ICD-10-CM | POA: Diagnosis not present

## 2017-06-10 DIAGNOSIS — I517 Cardiomegaly: Secondary | ICD-10-CM | POA: Diagnosis not present

## 2017-06-10 DIAGNOSIS — E119 Type 2 diabetes mellitus without complications: Secondary | ICD-10-CM | POA: Diagnosis not present

## 2017-06-10 DIAGNOSIS — R4182 Altered mental status, unspecified: Secondary | ICD-10-CM | POA: Diagnosis not present

## 2017-06-16 NOTE — Progress Notes (Signed)
Subjective:   Ronald Johnson was seen in consultation in the movement disorder clinic at the request of [name], MD.  This patient is accompanied in the office by his spouse who supplements the history.  The records that were made available to me were reviewed, including records from recent hospitalization.  The evaluation is for tremor.  Tremor started approximately 2 years ago and involves the bilateral hands.   He is R handed but both hands have equal tremor.   Tremor is most noticeable when writing but also when his hands are at rest.  It is worse in the AM.   There is no family hx of tremor.    Affected by caffeine:  No. (2 cups of coffee per day) Affected by alcohol:  hasnt had EtOh for 2 years Affected by stress:  Yes.   Affected by fatigue:  No. Spills soup if on spoon:  No. Spills glass of liquid if full:  No. Affects ADL's (tying shoes, brushing teeth, etc):  No.   Any other sx's: Voice: no change Sleep: sleeps well  Vivid Dreams:  Yes.    Acting out dreams:  No. Wet Pillows: No. Postural symptoms:  Yes.    Falls?  No. Bradykinesia symptoms: no bradykinesia noted Loss of smell:  No. Loss of taste:  No. Urinary Incontinence:  No. Difficulty Swallowing:  Yes.  , trouble with meats since esophageal CA and surgery for removal of stomach and part of esophagus Handwriting, micrographia: No., just more tremulous Depression:  No. Memory changes:  Yes.  , short term; recently in the hospital after having spell and "couldn't think clearly" and couldn't get words out correct.  He tripped when got out of his truck but otherwise could walk.  No lateralizing weakness or parethesias.  Wife stated that for a few words it was like he was like talking in tongues.  Lasted 1-2 hours.  He went to the hospital and started on plavix.  Had 2 other similar episodes within 6 months but didn't go to the hospital and took a nap and when woke up he got better.  His lipitor was increased from 10 mg  to 40 mg in the hospital.   N/V:  Yes.   , had some nausea but better (due to lack of stomach) Lightheaded:  No.  Syncope: No. Diplopia:  No. Dyskinesia:  No.   Current/Previously tried tremor medications: n/a  Current medications that may exacerbate tremor:  n/a  Outside reports reviewed: historical medical records, lab reports, office notes and referral letter/letters.  Patient brought me MRI of the brain films dated June 09, 2017.  These were done without gadolinium.  I did review these.  Brain images were limited as this was primarily an MRA of the brain.  Diffusion weighted images were negative.  There was evidence of fairly significant small vessel disease from what I could see.  MRA of the brain was reported to show no evidence of focal stenosis within the internal carotid arteries.  There was some decreased flow within the left vertebral artery, but notes indicated that this could be exaggerated by the technique utilized.  No Known Allergies  Outpatient Encounter Prescriptions as of 06/18/2017  Medication Sig  . atorvastatin (LIPITOR) 10 MG tablet TAKE 1 TABLET BY MOUTH  EVERY DAY  . cetirizine (ZYRTEC) 10 MG tablet Take 10 mg by mouth daily.  . clopidogrel (PLAVIX) 75 MG tablet Take 75 mg by mouth daily.  . diazepam (VALIUM) 5  MG tablet Take 1 tablet (5 mg total) by mouth 2 (two) times daily as needed for anxiety.  Marland Kitchen escitalopram (LEXAPRO) 10 MG tablet TAKE 1 TABLET BY MOUTH  DAILY  . glucose blood (PRECISION XTRA TEST STRIPS) test strip Check blood sugar three times a day dx 250.01  . ibuprofen (ADVIL,MOTRIN) 200 MG tablet Take 200 mg by mouth every 6 (six) hours as needed.  . insulin NPH-regular Human (NOVOLIN 70/30) (70-30) 100 UNIT/ML injection Inject 240 Units into the skin daily with breakfast.  . Insulin Syringe-Needle U-100 30G X 3/8" 1 ML MISC Use to inject insulin 3 times daily.  . Multiple Vitamin (MULTIVITAMIN) tablet Take 1 tablet by mouth daily.  . RABEprazole  (ACIPHEX) 20 MG tablet TAKE 1 TABLET(20 MG) BY MOUTH TWICE DAILY  . sildenafil (REVATIO) 20 MG tablet 1-5 tabs as needed for ED symptoms  . [DISCONTINUED] omeprazole (PRILOSEC) 40 MG capsule TAKE 1 CAPSULE(40 MG) BY MOUTH TWICE DAILY  . [DISCONTINUED] RABEPRAZOLE SODIUM PO Take by mouth.   Facility-Administered Encounter Medications as of 06/18/2017  Medication  . 0.9 %  sodium chloride infusion    Past Medical History:  Diagnosis Date  . Anemia 08/15/2009  . Anxiety   . Arthritis   . [name]'s esophagus 2007  . COLONIC POLYPS, ADENOMATOUS 08/01/2008, 2013, 2014  . COPD (chronic obstructive pulmonary disease) (HCC)    no per pt  . Depression   . Diabetes type 2, controlled ([place]) 03/13/2007  . Diverticulitis   . ED (erectile dysfunction)   . EMPHYSEMA 11/08/2008   no per pt  . Esophageal cancer ([place]) "dx'd ~ 01/2015"  . GERD 03/13/2007  . GIB (gastrointestinal bleeding) 08/18/2015  . Hiatal hernia   . History of blood transfusion 08/18/2015   due to GIB  . HYPERCHOLESTEROLEMIA 02/03/2008  . HYPERTENSION 03/13/2007   pt denies, claims white coat syndrome  . Prostatism   . PULMONARY NODULE 11/24/2008  . Stomach cancer ([place]) "dx'd ~ 01/2015"  . TOBACCO ABUSE 11/24/2008    Past Surgical History:  Procedure Laterality Date  . BALLOON DILATION N/A 03/19/2016   Procedure: BALLOON DILATION;  Surgeon: Irene Shipper, MD;  [facility];  Service: Endoscopy;  Laterality: N/A;  . CIRCUMCISION    . COLONOSCOPY    . COMPLETE ESOPHAGECTOMY N/A 07/25/2015   Procedure: TRANSHIATAL TOTAL ESOPHAGECTOMY COMPLETE PYLOROMYOTOMY;  Surgeon: [name], MD;  Location: [place];  Service: Thoracic;  Laterality: N/A;  . EGD  11/19/2005  . ESOPHAGOGASTRODUODENOSCOPY N/A 03/19/2016   Procedure: ESOPHAGOGASTRODUODENOSCOPY (EGD);  Surgeon: Irene Shipper, MD;  Location: [facility];  Service: Endoscopy;  Laterality: N/A;  . EUS N/A 03/16/2015   Procedure: UPPER ENDOSCOPIC ULTRASOUND (EUS) RADIAL;   Surgeon: Milus Banister, MD;  [facility];  Service: Endoscopy;  Laterality: N/A;  . FOOT FRACTURE SURGERY Right 1980   right foot w/ pins and screws  . JEJUNOSTOMY N/A 07/25/2015   Procedure: FEEDING JEJUNOSTOMY;  Surgeon: [name], MD;  Location: [place];  Service: Thoracic;  Laterality: N/A;  . POLYPECTOMY    . removal pins and screws foot  1980   right foot  . UPPER GASTROINTESTINAL ENDOSCOPY    . VASECTOMY    . VIDEO BRONCHOSCOPY N/A 07/25/2015   Procedure: VIDEO BRONCHOSCOPY;  Surgeon: [name], MD;  [facility];  Service: Thoracic;  Laterality: N/A;    Social History   Social History  . Marital status: Married    Spouse name: N/A  .  Number of children: 2  . Years of education: N/A   Occupational History  . 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 Smoking status: Former Smoker    Packs/day: 1.00    Years: 35.00    Types: Cigarettes    Quit date: 10/31/2008  . Smokeless tobacco: Never Used  . Alcohol use No  . Drug use: No  . Sexual activity: Not Currently   Other Topics Concern  . 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 Mother Deceased       CVA  . MGM (Not Specified)  . PGM (Not Specified)  . Father Deceased  . Sister Alive  . Brother Alive  . Neg Hx (Not Specified)    Review of Systems A complete 10 system ROS was obtained and was negative apart from what is mentioned.   Objective:   VITALS:   Vitals:   06/18/17 0845  BP: 108/68  Pulse: (!) 102  SpO2: 93%  Weight: 240 lb (108.9 kg)  Height: 6' 4" (1.93 m)   Gen:  Appears stated age and in NAD. HEENT:  Normocephalic, atraumatic. The mucous membranes are moist. The superficial temporal arteries are without ropiness or  tenderness. Cardiovascular: Regular rate and rhythm. Lungs: Clear to auscultation bilaterally. Neck: There are no carotid bruits noted bilaterally.  NEUROLOGICAL:  Orientation:  The patient is alert and oriented x 3.  Recent and remote memory are intact.  Attention span and concentration are normal.  Able to name objects and repeat without trouble.  Fund of knowledge is appropriate Cranial nerves: There is good facial symmetry. The pupils are equal round and reactive to light bilaterally. Fundoscopic exam reveals clear disc margins bilaterally. Extraocular muscles are intact and visual fields are full to confrontational testing. Speech is fluent and clear. Soft palate rises symmetrically and there is no tongue deviation. Hearing is intact to conversational tone. Tone: Tone is good throughout. Sensation: Sensation is intact to light touch and pinprick throughout (facial, trunk, extremities). Vibration is decreased at the bilateral big toe and ankle. There is no extinction with double simultaneous stimulation. There is no sensory dermatomal level identified. Coordination:  The patient has no dysdiadichokinesia or dysmetria. Motor: Strength is 5/5 in the bilateral upper and lower extremities.  Shoulder shrug is equal bilaterally.  There is no pronator drift.  There are no fasciculations noted. DTR's: Deep tendon reflexes are 2-/4 at the bilateral biceps, triceps, brachioradialis, 1/4 at the bilateral patella and absent at the bilateral achilles.  Plantar responses are downgoing bilaterally. Gait and Station: The patient is able to ambulate without difficulty. The patient is able to heel toe walk without any difficulty. The patient is able to ambulate in a tandem fashion. The patient is able to stand in the Romberg position.   MOVEMENT EXAM: Tremor:  There is almost no tremor of the outstretched hands.  No tremor with intention.  No tremor when given a weight.  No difficulty with Archimedes spirals.  He  has mild tremor when asked to pour water from one glass to another but really does not spill the water.  Lab Results  Component Value Date   HGBA1C 7.4 05/05/2017   Lab Results  Component Value Date   TSH 1.95 05/06/2017   Lab Results  Component Value Date   VITAMINB12 545 08/03/2013        Assessment/Plan:   1.  Tremor.  -I saw very little tremor today.  Given that this is worse in the mornings and better throughout the day, I wonder if this is related to blood sugar fluctuations.  The patient is not so sure if this is the case.  He may have a mild degree of essential tremor or enhanced physiologic tremor.  Regardless, I did not recommend any medication for this today and he agreed.  I saw no evidence of a neuro degenerative process such as Parkinson's disease.  Reassurance was provided.  2.  Peripheral neuropathy  -The patient has clinical examination evidence of a diffuse peripheral neuropathy, which is likely due to peripheral neuropathy.  Safety was discussed.  3.  Recent TIA  -We discussed the diagnosis as well as pathophysiology of the disease.  We discussed signs and sx's of stroke and importance of calling 911 immediately should he have any of these.  Patient education was provided.    -Talked about stroke risk factors which include prior tobacco use, HTN, hyperlipidemia,diabetes, age.  Talked about those risk factors which are modifiable.  -Talked about importance of blood pressure control with a goal <130/80 mm Hg.   -Talked about importance of lipid control and proper diet.  Lipids should be managed intensively, with a goal LDL < 70 mg/dL.  His Lipitor dose was just increased while in the hospital.  -Plavix was added while in the hospital.  -I counseled the patient on measures to reduce stroke risk, including the importance of medication compliance, risk factor control, exercise, healthy diet, and avoidance of smoking.  He is not exercising currently and talked about the  importance of adding this.  He does tell me that he has an appointment later this afternoon to get a referral to Dr. Stanford Breed for a stress test and he would need to get permission to exercise.  4.  I will see him back on an as-needed basis.  Greater than 50% of the 45-minute visit was spent in counseling with the patient and his wife.    CC:  [name], MD

## 2017-06-18 ENCOUNTER — Ambulatory Visit (INDEPENDENT_AMBULATORY_CARE_PROVIDER_SITE_OTHER): Payer: Medicare Other | Admitting: Endocrinology

## 2017-06-18 ENCOUNTER — Encounter: Payer: Self-pay | Admitting: Neurology

## 2017-06-18 ENCOUNTER — Encounter: Payer: Self-pay | Admitting: Endocrinology

## 2017-06-18 ENCOUNTER — Ambulatory Visit (INDEPENDENT_AMBULATORY_CARE_PROVIDER_SITE_OTHER): Payer: Medicare Other | Admitting: Neurology

## 2017-06-18 VITALS — BP 108/68 | HR 102 | Ht 76.0 in | Wt 240.0 lb

## 2017-06-18 DIAGNOSIS — Z794 Long term (current) use of insulin: Secondary | ICD-10-CM | POA: Diagnosis not present

## 2017-06-18 DIAGNOSIS — Z8673 Personal history of transient ischemic attack (TIA), and cerebral infarction without residual deficits: Secondary | ICD-10-CM

## 2017-06-18 DIAGNOSIS — G459 Transient cerebral ischemic attack, unspecified: Secondary | ICD-10-CM | POA: Diagnosis not present

## 2017-06-18 DIAGNOSIS — R251 Tremor, unspecified: Secondary | ICD-10-CM

## 2017-06-18 DIAGNOSIS — E119 Type 2 diabetes mellitus without complications: Secondary | ICD-10-CM

## 2017-06-18 DIAGNOSIS — R9431 Abnormal electrocardiogram [ECG] [EKG]: Secondary | ICD-10-CM

## 2017-06-18 DIAGNOSIS — Z0000 Encounter for general adult medical examination without abnormal findings: Secondary | ICD-10-CM | POA: Diagnosis not present

## 2017-06-18 DIAGNOSIS — E1142 Type 2 diabetes mellitus with diabetic polyneuropathy: Secondary | ICD-10-CM

## 2017-06-18 MED ORDER — INSULIN NPH ISOPHANE & REGULAR (70-30) 100 UNIT/ML ~~LOC~~ SUSP: 200.0000 [IU] | Freq: Every day | SUBCUTANEOUS | 11 refills | Status: DC

## 2017-06-18 NOTE — Progress Notes (Signed)
we discussed code status.  pt requests full code, but would not want to be started or maintained on artificial life-support measures if there was not a reasonable chance of recovery 

## 2017-06-18 NOTE — Patient Instructions (Addendum)
Please reduce the "70/30," insulin to 200 units each morning.  On this type of insulin schedule, you should eat meals on a regular schedule.  If a meal is missed or significantly delayed, your blood sugar could go low.  check your blood sugar twice a day.  vary the time of day when you check, between before the 3 meals, and at bedtime.  also check if you have symptoms of your blood sugar being too high or too low.  please keep a record of the readings and bring it to your next appointment here (or you can bring the meter itself).  You can write it on any piece of paper.  please call us sooner if your blood sugar goes below 70, or if you have a lot of readings over 200.   Please go back to see Dr Janace Hoard, to follow up the hoarseness.   Please see a heart specialist.  you will receive a phone call, about a day and time for an appointment. Please consider these measures for your health:  minimize alcohol.  Do not use tobacco products.  Have a colonoscopy at least every 10 years from age 13.  Keep firearms safely stored.  Always use seat belts.  have working smoke alarms in your home.  See an eye doctor and dentist regularly.  Never drive under the influence of alcohol or drugs (including prescription drugs).  Those with fair skin should take precautions against the sun, and should carefully examine their skin once per month, for any new or changed moles. please let me know what your wishes would be, if artificial life support measures should become necessary.  It is critically important to prevent falling down (keep floor areas well-lit, dry, and free of loose objects.  If you have a cane, walker, or wheelchair, you should use it, even for short trips around the house.  Wear flat-soled shoes.  Also, try not to rush).   Please come back for a follow-up appointment in 3 months.

## 2017-06-18 NOTE — Progress Notes (Signed)
Subjective:    Patient ID: Ronald Johnson, male    DOB: 1946-06-24, 71 y.o.   MRN: [id number]  HPI  Pt returns for f/u of diabetes mellitus:  DM type: Insulin-requiring type 2.  Dx'ed: 1993.   Complications: polyneuropathy, PAD, and renal insuff.   Therapy: insulin since 2004.  DKA: never.  Severe hypoglycemia: never.   Pancreatitis: never.   Other: he changed to qd insulin, after he did not achieve good control on multiple daily injections; he cannot afford insulin analogs; insulin requirement has decreased since cancer dx; he change to QAM 70/30, due to pattern of cbg's Interval history: no cbg record, but states cbg's vary from 28-210.  It is in general higher as the day goes on.   pt states he feels well in general.    Pt was seen in [place] ER 9 days ago, for slurred speech, and assoc confusion.  No cause was found, but hypoglycemia was suspected.  abnormal ECG was noted in ER.    Past Medical History:  Diagnosis Date  . Anemia 08/15/2009  . Anxiety   . Arthritis   . [name]'s esophagus 2007  . COLONIC POLYPS, ADENOMATOUS 08/01/2008, 2013, 2014  . COPD (chronic obstructive pulmonary disease) (HCC)    no per pt  . Depression   . Diabetes type 2, controlled ([place]) 03/13/2007  . Diverticulitis   . ED (erectile dysfunction)   . EMPHYSEMA 11/08/2008   no per pt  . Esophageal cancer ([place]) "dx'd ~ 01/2015"  . GERD 03/13/2007  . GIB (gastrointestinal bleeding) 08/18/2015  . Hiatal hernia   . History of blood transfusion 08/18/2015   due to GIB  . HYPERCHOLESTEROLEMIA 02/03/2008  . HYPERTENSION 03/13/2007   pt denies, claims white coat syndrome  . Prostatism   . PULMONARY NODULE 11/24/2008  . Stomach cancer ([place]) "dx'd ~ 01/2015"  . TOBACCO ABUSE 11/24/2008    Past Surgical History:  Procedure Laterality Date  . BALLOON DILATION N/A 03/19/2016   Procedure: BALLOON DILATION;  Surgeon: Irene Shipper, MD;  [facility];  Service: Endoscopy;  Laterality: N/A;  . CIRCUMCISION     . COLONOSCOPY    . COMPLETE ESOPHAGECTOMY N/A 07/25/2015   Procedure: TRANSHIATAL TOTAL ESOPHAGECTOMY COMPLETE PYLOROMYOTOMY;  Surgeon: [name], MD;  Location: [place];  Service: Thoracic;  Laterality: N/A;  . EGD  11/19/2005  . ESOPHAGOGASTRODUODENOSCOPY N/A 03/19/2016   Procedure: ESOPHAGOGASTRODUODENOSCOPY (EGD);  Surgeon: Irene Shipper, MD;  [facility];  Service: Endoscopy;  Laterality: N/A;  . EUS N/A 03/16/2015   Procedure: UPPER ENDOSCOPIC ULTRASOUND (EUS) RADIAL;  Surgeon: Milus Banister, MD;  [facility];  Service: Endoscopy;  Laterality: N/A;  . FOOT FRACTURE SURGERY Right 1980   right foot w/ pins and screws  . JEJUNOSTOMY N/A 07/25/2015   Procedure: FEEDING JEJUNOSTOMY;  Surgeon: [name], MD;  Location: [place];  Service: Thoracic;  Laterality: N/A;  . POLYPECTOMY    . removal pins and screws foot  1980   right foot  . UPPER GASTROINTESTINAL ENDOSCOPY    . VASECTOMY    . VIDEO BRONCHOSCOPY N/A 07/25/2015   Procedure: VIDEO BRONCHOSCOPY;  Surgeon: [name], MD;  Location: [facility] OR;  Service: Thoracic;  Laterality: N/A;    Social History   Social History  . Marital status: Married    Spouse name: N/A  . Number of children: 2  . Years of education: N/A   Occupational History  . 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 Smoking status: Former Smoker    Packs/day: 1.00    Years: 35.00    Types: Cigarettes    Quit date: 10/31/2008  . Smokeless tobacco: Never Used  . Alcohol use No  . Drug use: No  . Sexual activity: Not Currently   Other Topics Concern  . 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 atorvastatin (LIPITOR) 10 MG  tablet TAKE 1 TABLET BY MOUTH  EVERY DAY 90 tablet 3  . cetirizine (ZYRTEC) 10 MG tablet Take 10 mg by mouth daily.    . clopidogrel (PLAVIX) 75 MG tablet Take 75 mg by mouth daily.    . diazepam (VALIUM) 5 MG tablet Take 1 tablet (5 mg total) by mouth 2 (two) times daily as needed for anxiety. 180 tablet 1  . escitalopram (LEXAPRO) 10 MG tablet TAKE 1 TABLET BY MOUTH  DAILY 90 tablet 2  . glucose blood (PRECISION XTRA TEST STRIPS) test strip Check blood sugar three times a day dx 250.01 270 each 3  . ibuprofen (ADVIL,MOTRIN) 200 MG tablet Take 200 mg by mouth every 6 (six) hours as needed.    . Insulin Syringe-Needle U-100 30G X 3/8" 1 ML MISC Use to inject insulin 3 times daily. 300 each 1  . Multiple Vitamin (MULTIVITAMIN) tablet Take 1 tablet by mouth daily.    . RABEprazole (ACIPHEX) 20 MG tablet TAKE 1 TABLET(20 MG) BY MOUTH TWICE DAILY 60 tablet 3  . sildenafil (REVATIO) 20 MG tablet 1-5 tabs as needed for ED symptoms 100 tablet 11  . [DISCONTINUED] metoprolol succinate (TOPROL-XL) 25 MG 24 hr tablet Take 25 mg by mouth daily.      Current Facility-Administered Medications on File Prior to Visit  Medication Dose Route Frequency Provider Last Rate Last Dose  . 0.9 %  sodium chloride infusion  500 mL Intravenous Continuous Irene Shipper, MD        No Known Allergies  Family History  Problem Relation Age of Onset  . Stroke Mother   . Diabetes Mother   . Diabetes Maternal Grandmother        mother side of the family aunts, MGF  . Breast cancer Paternal Grandmother   . AAA (abdominal aortic aneurysm) Father   . Colon cancer Neg Hx   . Esophageal cancer Neg Hx   . Rectal cancer Neg Hx   . Stomach cancer Neg Hx     BP 112/74   Pulse 96   Wt 240 lb 12.8 oz (109.2 kg)   SpO2 95%   BMI 29.31 kg/m     Review of Systems He has intermitt hoarseness.  Denies chest pain.     Objective:   Physical Exam VITAL SIGNS:  See vs page.  GENERAL: no distress.  Neuro: speech is normal  now. Gait: normal and steady.    Lab Results  Component Value Date   HGBA1C 7.4 05/05/2017      Assessment & Plan:  Insulin-requiring type 2 DM: this is the best control this pt should aim for, given this regimen, which does match insulin to her changing needs throughout the day.  TIA, new, possibly due to hypoglycemia  Please reduce the "70/30," insulin to 200 units each morning.    Subjective:   Patient here for Medicare annual wellness visit and management of other chronic and acute problems.     Risk factors: advanced age    33 of Physicians Providing Medical Care to Patient:  See "snapshot"   Activities of Daily Living: In your present state of health, do you have any difficulty performing the following activities (lives with wife)?:  Preparing food and eating?: No  Bathing yourself: No  Getting dressed: No  Using the toilet:No  Moving around from place to place: No  In the past year have you fallen or had a near fall?: No    Home Safety: Has smoke detector and wears seat belts. No firearms. No excess sun exposure.    Opioid Use: none   Diet and Exercise  Current exercise habits: limited by health probs.  Dietary issues discussed: pt reports a healthy diet   Depression Screen  Q1: Over the past two weeks, have you felt down, depressed or hopeless? no  Q2: Over the past two weeks, have you felt little interest or pleasure in doing things? no   The following portions of the patient's history were reviewed and updated as appropriate: allergies, current medications, past family history, past medical history, past social history, past surgical history and problem list.   Review of Systems  Denies hearing loss, and visual loss Objective:   Vision:  Advertising account executive, so he declines VA today.  Hearing: grossly normal Body mass index:  See vs page Msk: pt easily and quickly performs "get-up-and-go" from a sitting position Cognitive Impairment Assessment:  cognition, memory and judgment appear normal.  remembers 3/3 at 5 minutes.  excellent recall.  can easily read and write a sentence.  alert and oriented x 3   Assessment:   Medicare wellness utd on preventive parameters    Plan:   During the course of the visit the patient was educated and counseled about appropriate screening and preventive services including:        Fall prevention is advised today   Diabetes screening  Nutrition counseling is offered   Vaccines are updated as needed  Patient Instructions (the written plan) was given to the patient.

## 2017-06-20 ENCOUNTER — Other Ambulatory Visit: Payer: Self-pay

## 2017-06-20 MED ORDER — INSULIN NPH ISOPHANE & REGULAR (70-30) 100 UNIT/ML ~~LOC~~ SUSP: SUBCUTANEOUS | 11 refills | Status: DC

## 2017-06-25 DIAGNOSIS — R51 Headache: Secondary | ICD-10-CM | POA: Diagnosis not present

## 2017-06-25 DIAGNOSIS — R490 Dysphonia: Secondary | ICD-10-CM | POA: Diagnosis not present

## 2017-06-27 ENCOUNTER — Telehealth: Payer: Self-pay | Admitting: Endocrinology

## 2017-06-27 NOTE — Telephone Encounter (Signed)
Options: Severe headache: go to ER Not as bad: [place] tomorrow, or we can add on for Monday.

## 2017-06-27 NOTE — Telephone Encounter (Signed)
Called and made patient appt 2:00pm Monday.

## 2017-06-27 NOTE — Telephone Encounter (Signed)
Pt wife called requesting to speak to [place] regarding pt having severe headache for over a month now. Wife stated it started with the sinus infection but that's gone. Please advise?  Call wife @ [phone]. Thank you!

## 2017-06-27 NOTE — Telephone Encounter (Signed)
Please advise on this.  

## 2017-06-27 NOTE — Telephone Encounter (Signed)
Forwarding

## 2017-06-30 ENCOUNTER — Encounter: Payer: Self-pay | Admitting: Endocrinology

## 2017-06-30 ENCOUNTER — Ambulatory Visit (INDEPENDENT_AMBULATORY_CARE_PROVIDER_SITE_OTHER): Payer: Medicare Other | Admitting: Endocrinology

## 2017-06-30 VITALS — BP 158/78 | HR 104 | Wt 240.6 lb

## 2017-06-30 DIAGNOSIS — E1151 Type 2 diabetes mellitus with diabetic peripheral angiopathy without gangrene: Secondary | ICD-10-CM

## 2017-06-30 DIAGNOSIS — R51 Headache: Secondary | ICD-10-CM | POA: Diagnosis not present

## 2017-06-30 DIAGNOSIS — Z794 Long term (current) use of insulin: Secondary | ICD-10-CM

## 2017-06-30 DIAGNOSIS — R519 Headache, unspecified: Secondary | ICD-10-CM | POA: Insufficient documentation

## 2017-06-30 LAB — POCT GLYCOSYLATED HEMOGLOBIN (HGB A1C): HEMOGLOBIN A1C: 6.9

## 2017-06-30 MED ORDER — INSULIN NPH ISOPHANE & REGULAR (70-30) 100 UNIT/ML ~~LOC~~ SUSP: 180.0000 [IU] | Freq: Every day | SUBCUTANEOUS | 11 refills | Status: DC

## 2017-06-30 MED ORDER — TRAMADOL HCL 50 MG PO TABS: 50.0000 mg | ORAL_TABLET | Freq: Four times a day (QID) | ORAL | 0 refills | Status: DC | PRN

## 2017-06-30 NOTE — Progress Notes (Signed)
Subjective:    Patient ID: Ronald Johnson, male    DOB: 09-15-1945, 71 y.o.   MRN: [id number]  HPI Pt returns for f/u of diabetes mellitus: DM type: Insulin-requiring type 2.  Dx'ed: 3474 Complications: polyneuropathy, PAD, and renal insuff.  Therapy: insulin since 2004.  DKA: never.  Severe hypoglycemia: never.   Pancreatitis: never.   Other: he changed to qd insulin, after he did not achieve good control on multiple daily injections; he cannot afford insulin analogs; insulin requirement has decreased since cancer dx; he change to QAM 70/30, due to pattern of cbg's.  Interval history: no cbg record, but states cbg's vary from 70-220.  It is in general higher as the day goes on.   pt states he feels well in general.   He has 5 weeks of constant moderate bifrontal  Headache, but no assoc n/v.   Past Medical History:  Diagnosis Date  . Anemia 08/15/2009  . Anxiety   . Arthritis   . [name]'s esophagus 2007  . COLONIC POLYPS, ADENOMATOUS 08/01/2008, 2013, 2014  . COPD (chronic obstructive pulmonary disease) (HCC)    no per pt  . Depression   . Diabetes type 2, controlled ([place]) 03/13/2007  . Diverticulitis   . ED (erectile dysfunction)   . EMPHYSEMA 11/08/2008   no per pt  . Esophageal cancer ([place]) "dx'd ~ 01/2015"  . GERD 03/13/2007  . GIB (gastrointestinal bleeding) 08/18/2015  . Hiatal hernia   . History of blood transfusion 08/18/2015   due to GIB  . HYPERCHOLESTEROLEMIA 02/03/2008  . HYPERTENSION 03/13/2007   pt denies, claims white coat syndrome  . Prostatism   . PULMONARY NODULE 11/24/2008  . Stomach cancer ([place]) "dx'd ~ 01/2015"  . TOBACCO ABUSE 11/24/2008    Past Surgical History:  Procedure Laterality Date  . CIRCUMCISION    . COLONOSCOPY    . EGD  11/19/2005  . FOOT FRACTURE SURGERY Right 1980   right foot w/ pins and screws  . POLYPECTOMY    . removal pins and screws foot  1980   right foot  . UPPER GASTROINTESTINAL ENDOSCOPY    . VASECTOMY      Social History    Socioeconomic History  . Marital status: Married    Spouse name: Not on file  . Number of children: 2  . Years of education: Not on file  . Highest education level: Not on file  Social Needs  . Financial resource strain: Not on file  . Food insecurity - worry: Not on file  . Food insecurity - inability: Not on file  . Transportation needs - medical: Not on file  . Transportation needs - non-medical: Not on file  Occupational History  . Occupation: retired    Fish farm manager: [employer]    Comment: works Youth worker  Tobacco Use  . Smoking status: Former Smoker    Packs/day: 1.00    Years: 35.00    Pack years: 35.00    Types: Cigarettes    Last attempt to quit: 10/31/2008    Years since quitting: 8.6  . Smokeless tobacco: Never Used  Substance and Sexual Activity  . Alcohol use: No    Alcohol/week: 0.0 oz  . Drug use: No  . Sexual activity: Not Currently  Other Topics Concern  . 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 atorvastatin (LIPITOR) 10 MG tablet TAKE 1 TABLET BY MOUTH  EVERY DAY 90 tablet 3  . cetirizine (ZYRTEC) 10 MG tablet Take 10 mg by mouth daily.    . clopidogrel (PLAVIX) 75 MG tablet Take 75 mg by mouth daily.    . diazepam (VALIUM) 5 MG tablet Take 1 tablet (5 mg total) by mouth 2 (two) times daily as needed for anxiety. 180 tablet 1  . escitalopram (LEXAPRO) 10 MG tablet TAKE 1 TABLET BY MOUTH  DAILY 90 tablet 2  . glucose blood (PRECISION XTRA TEST STRIPS) test strip Check blood sugar three times a day dx 250.01 270 each 3  . ibuprofen (ADVIL,MOTRIN) 200 MG tablet Take 200 mg by mouth every 6 (six) hours as needed.    . Insulin Syringe-Needle U-100 30G X 3/8" 1 ML MISC Use to inject insulin 3 times  daily. 300 each 1  . Multiple Vitamin (MULTIVITAMIN) tablet Take 1 tablet by mouth daily.    . RABEprazole (ACIPHEX) 20 MG tablet TAKE 1 TABLET(20 MG) BY MOUTH TWICE DAILY 60 tablet 3  . sildenafil (REVATIO) 20 MG tablet 1-5 tabs as needed for ED symptoms 100 tablet 11  . ranitidine (ZANTAC) 150 MG capsule Take 150 mg by mouth daily.    . [DISCONTINUED] metoprolol succinate (TOPROL-XL) 25 MG 24 hr tablet Take 25 mg by mouth daily.      Current Facility-Administered Medications on File Prior to Visit  Medication Dose Route Frequency Provider Last Rate Last Dose  . 0.9 %  sodium chloride infusion  500 mL Intravenous Continuous Irene Shipper, MD        No Known Allergies  Family History  Problem Relation Age of Onset  . Stroke Mother   . Diabetes Mother   . Diabetes Maternal Grandmother        mother side of the family aunts, MGF  . Breast cancer Paternal Grandmother   . AAA (abdominal aortic aneurysm) Father   . Colon cancer Neg Hx   . Esophageal cancer Neg Hx   . Rectal cancer Neg Hx   . Stomach cancer Neg Hx     BP (!) 158/78 (BP Location: Right Arm, Patient Position: Sitting, Cuff Size: Normal)   Pulse (!) 104   Wt 240 lb 9.6 oz (109.1 kg)   SpO2 93%   BMI 29.29 kg/m   Review of Systems He denies hypoglycemia.  Denies blurry vision.  He say loss of voice is due to recent URI.      Objective:   Physical Exam  VS: see vs page GEN: no distress HEAD: head: no deformity eyes: no periorbital swelling, no proptosis external nose and ears are normal mouth: no lesion seen Both eac's and tm's are normal.   MUSCULOSKELETAL: muscle bulk and strength are grossly normal.  no obvious joint swelling.  gait is normal and steady NEURO:  cn 2-12 grossly intact.   readily moves all 4's.  sensation is intact to touch on the feet SKIN:  Not diaphoretic. PSYCH: alert, well-oriented.  Does not appear anxious nor depressed.   CT/MRI: No acute intracranial abnormality.   Lab Results    Component Value Date   HGBA1C 6.9 06/30/2017      Assessment & Plan:  Headache, new to me, uncertain etiology Insulin-requiring type 2 DM: overcontrolled, given this insulin regimen, which does match  insulin to his changing needs throughout the day.  HTN: recheck next time.    Patient Instructions  Please reduce the insulin to 180 units each morning Here is a prescription for the headache. Please see a headache specialist.  you will receive a phone call, about a day and time for an appointment blood tests are requested for you today.  We'll let you know about the results.  Please come back for a follow-up appointment in 3 months

## 2017-06-30 NOTE — Patient Instructions (Addendum)
Please reduce the insulin to 180 units each morning Here is a prescription for the headache. Please see a headache specialist.  you will receive a phone call, about a day and time for an appointment blood tests are requested for you today.  We'll let you know about the results.  Please come back for a follow-up appointment in 3 months

## 2017-07-01 ENCOUNTER — Other Ambulatory Visit (INDEPENDENT_AMBULATORY_CARE_PROVIDER_SITE_OTHER): Payer: Medicare Other

## 2017-07-01 DIAGNOSIS — R519 Headache, unspecified: Secondary | ICD-10-CM

## 2017-07-01 DIAGNOSIS — R51 Headache: Secondary | ICD-10-CM

## 2017-07-01 LAB — SEDIMENTATION RATE: SED RATE: 52 mm/h — AB (ref 0–20)

## 2017-07-03 ENCOUNTER — Encounter: Payer: Self-pay | Admitting: Nurse Practitioner

## 2017-07-03 ENCOUNTER — Ambulatory Visit (INDEPENDENT_AMBULATORY_CARE_PROVIDER_SITE_OTHER): Payer: Medicare Other | Admitting: Nurse Practitioner

## 2017-07-03 VITALS — BP 128/68 | HR 112 | Ht 76.0 in | Wt 237.0 lb

## 2017-07-03 DIAGNOSIS — K222 Esophageal obstruction: Secondary | ICD-10-CM

## 2017-07-03 DIAGNOSIS — Z7901 Long term (current) use of anticoagulants: Secondary | ICD-10-CM

## 2017-07-03 NOTE — Progress Notes (Signed)
     Chief Complaint:  dysphagia  HPI: Patient is a 71 year old male known to Dr. Henrene Pastor . He has multiple medical problems not limited to COPD, diabetes, history of esophageal cancer status post radiation, chemotherapy and esophagectomy 2016.  He has a history of anastomotic strictures of the esophagus requiring dilation, last one in June of this year  In late October patient presented to ED in [facility] facility) with confusion and altered speech. Reviewed records in [facility]. No head CT abnormalities. Remote infarcts but no ischemia on MR/ MRI of brain.  No significant stenosis on carotids. Troponin slightly elevated at 0.03 in absence of chest pain. Discharged from ED on plavix. He saw Neurologist Dr. Carles Collet though he was already scheduled to see her anyway for evaluation of tremors.  He is supposed to see Dr. Stanford Breed with Cardiology as well. Patient   Ronald Johnson is back with 4-6 week history of recurrent dysphagia.  He chops food to very a fine consistency  Sometimes fluids can be problematic as well. No odynophagia. No chest pain.  No heartburn.    Past Medical History:  Diagnosis Date  . Anemia 08/15/2009  . Anxiety   . Arthritis   . [name]'s esophagus 2007  . COLONIC POLYPS, ADENOMATOUS 08/01/2008, 2013, 2014  . COPD (chronic obstructive pulmonary disease) (HCC)    no per pt  . Depression   . Diabetes type 2, controlled ([place]) 03/13/2007  . Diverticulitis   . ED (erectile dysfunction)   . EMPHYSEMA 11/08/2008   no per pt  . Esophageal cancer ([place]) "dx'd ~ 01/2015"  . GERD 03/13/2007  . GIB (gastrointestinal bleeding) 08/18/2015  . Hiatal hernia   . History of blood transfusion 08/18/2015   due to GIB  . HYPERCHOLESTEROLEMIA 02/03/2008  . HYPERTENSION 03/13/2007   pt denies, claims white coat syndrome  . Prostatism   . PULMONARY NODULE 11/24/2008  . Stomach cancer ([place]) "dx'd ~ 01/2015"  . TOBACCO ABUSE 11/24/2008    Patient's surgical history, family medical  history, social history, medications and allergies were all reviewed in Epic    Physical Exam: BP 128/68   Pulse (!) 112   Ht 6\' 4"  (1.93 m)   Wt 237 lb (107.5 kg)   BMI 28.85 kg/m   GENERAL:  Well developed white male in NAD PSYCH: :Pleasant, cooperative, normal affect NEURO: Alert and oriented x 3, no focal neurologic deficits   ASSESSMENT and PLAN:  Pleasant 71 year old male with esophageal cancer in 2016, s/p esophagectomy. He has a hx of anastomotic strictures requiring dilation, last one in June of this year. Back with recurrent dysphagia over the last month.  -Unfortunately patient recently started on plaviin in ED in [facility] for possible TIA after presenting with confusion and altered speech.  Troponin mildly elevated at the time though no associated chest pain .  He was advised to follow up with Cardiology, already seen by Neurology.  Paient will contact us following cardiac evaluation. We will then contact Cardiology about holding Plavix for EGD. May need to contact Neurology as well. -Advised patient to eat small bites, chew well with liquids in between bites to avoid food impaction.  I spent 25 minutes of face-to-face time with the patient. Greater than 50% of the time was spent counseling and coordinating care. Questions answered      Ronald Johnson , NP 07/03/2017, 9:55 AM

## 2017-07-03 NOTE — Patient Instructions (Signed)
If you are age 69 or older, your body mass index should be between 23-30. Your Body mass index is 28.85 kg/m. If this is out of the aforementioned range listed, please consider follow up with your Primary Care Provider.  If you are age 56 or younger, your body mass index should be between 19-25. Your Body mass index is 28.85 kg/m. If this is out of the aformentioned range listed, please consider follow up with your Primary Care Provider.   Call us after you have been seen by Cardiologist.  We will need clearance to stop Plavix from Cardiology and Neurology.  Thank you for choosing me and [facility].   Tye Savoy, NP

## 2017-07-06 NOTE — Progress Notes (Signed)
Assessment and plans reviewed  

## 2017-07-07 ENCOUNTER — Other Ambulatory Visit: Payer: Self-pay

## 2017-07-07 ENCOUNTER — Ambulatory Visit: Payer: Self-pay | Admitting: Endocrinology

## 2017-07-07 NOTE — Telephone Encounter (Signed)
Ok to hold off on steroids.  Is the pill I prescribed helping?

## 2017-07-07 NOTE — Telephone Encounter (Signed)
Patient is call for the result of his labs

## 2017-07-07 NOTE — Telephone Encounter (Signed)
I called patient & read him your mychart message. He does not want to do steriods of any kind because of high blood sugars. He was wondering if there was any other alternative?

## 2017-07-07 NOTE — Telephone Encounter (Signed)
I called & notified patient's wife. She said that she was unsure if he had been taking that many, but if they needed another prescription she would call back.

## 2017-07-07 NOTE — Telephone Encounter (Signed)
I called and spoke with patient's wife because patient wasn't available. His wife seemed to think that tramadol did help & so did Excedrin. It just doesn't fully take the headache away.

## 2017-07-07 NOTE — Telephone Encounter (Signed)
Ok, you can take as many as 6 per day

## 2017-07-08 ENCOUNTER — Ambulatory Visit: Payer: Self-pay | Admitting: Endocrinology

## 2017-07-14 NOTE — Progress Notes (Signed)
Cardiology Office Note    Date:  07/16/2017   ID:  Ronald, Johnson [birth year], MRN [id number]  PCP:  [name], MD  Cardiologist: Previously Dr. Stanford Breed (last evaluated in 2008) --> wishes to re-establish care with him  Chief Complaint  Patient presents with  . New Patient (Initial Visit)    History of Present Illness:    Ronald Johnson is a 71 y.o. male with past medical history of HTN, HLD, Type 2 DM, prior PE and esophageal cancer (s/p esophagectomy and chemotherapy) who presents to the office today as a new patient referral for evaluation of dyspnea on exertion at the request of Dr. Loanne Drilling.   The patient was recently evaluated at the [facility] ED in 05/2017 for sudden onset altered mental status and headache. Head CT was obtained at that time and showed no acute intracranial abnormalities. Lactic acid was at 2.6. Troponin was found to be slightly elevated at 0.03. By review of records an EKG was obtained and showed T wave inversion along V1-V6 with Q waves noted. He denied any anginal symptoms at that time and was therefore started on Plavix and informed to follow-up with Cardiology in the outpatient setting.  He was previously hospitalized in 08/2015 for anemia in the setting of a GIB while on anticoagulation for a PE. Troponin values were obtained that admission and peaked at 0.77. Outpatient ischemic evaluation was recommended but I cannot see where this was obtained.   In talking with the patient and his wife today, he reports worsening fatigue and dyspnea on exertion for the past several months. This was initially noted following his esophageal surgery in 2016 but he has noticed acute worsening of his symptoms over the past several months. He notes occasional chest discomfort but this mostly occurs at rest and not with activity, exacerbated by coughing. His dyspnea is present when walking around the house or grocery store. Denies any associated orthopnea, PND, or lower  extremity edema. He has COPD which is followed by Dr. Elsworth Soho.   He has no prior cardiac history of CAD or cardiac arrhythmias. A preoperative stress test had been obtained in 2015 and showed no reversible ischemia at that time, felt to be low-risk.     Past Medical History:  Diagnosis Date  . Anemia 08/15/2009  . Anxiety   . Arthritis   . [name]'s esophagus 2007  . COLONIC POLYPS, ADENOMATOUS 08/01/2008, 2013, 2014  . COPD (chronic obstructive pulmonary disease) (HCC)    no per pt  . Depression   . Diabetes type 2, controlled ([place]) 03/13/2007  . Diverticulitis   . ED (erectile dysfunction)   . EMPHYSEMA 11/08/2008   no per pt  . Esophageal cancer ([place]) "dx'd ~ 01/2015"  . GERD 03/13/2007  . GIB (gastrointestinal bleeding) 08/18/2015  . Hiatal hernia   . History of blood transfusion 08/18/2015   due to GIB  . HYPERCHOLESTEROLEMIA 02/03/2008  . HYPERTENSION 03/13/2007   pt denies, claims white coat syndrome  . Prostatism   . PULMONARY NODULE 11/24/2008  . Stomach cancer ([place]) "dx'd ~ 01/2015"  . TOBACCO ABUSE 11/24/2008    Past Surgical History:  Procedure Laterality Date  . BALLOON DILATION N/A 03/19/2016   Procedure: BALLOON DILATION;  Surgeon: Irene Shipper, MD;  Location: [facility];  Service: Endoscopy;  Laterality: N/A;  . CIRCUMCISION    . COLONOSCOPY    . COMPLETE ESOPHAGECTOMY N/A 07/25/2015   Procedure: TRANSHIATAL TOTAL ESOPHAGECTOMY COMPLETE PYLOROMYOTOMY;  Surgeon: Lilia Argue  Servando Snare, MD;  Location: [facility];  Service: Thoracic;  Laterality: N/A;  . EGD  11/19/2005  . ESOPHAGOGASTRODUODENOSCOPY N/A 03/19/2016   Procedure: ESOPHAGOGASTRODUODENOSCOPY (EGD);  Surgeon: Irene Shipper, MD;  Location: [facility];  Service: Endoscopy;  Laterality: N/A;  . EUS N/A 03/16/2015   Procedure: UPPER ENDOSCOPIC ULTRASOUND (EUS) RADIAL;  Surgeon: Milus Banister, MD;  Location: [facility];  Service: Endoscopy;  Laterality: N/A;  . FOOT FRACTURE SURGERY Right 1980   right foot w/ pins and  screws  . JEJUNOSTOMY N/A 07/25/2015   Procedure: FEEDING JEJUNOSTOMY;  Surgeon: [name], MD;  Location: [facility];  Service: Thoracic;  Laterality: N/A;  . POLYPECTOMY    . removal pins and screws foot  1980   right foot  . UPPER GASTROINTESTINAL ENDOSCOPY    . VASECTOMY    . VIDEO BRONCHOSCOPY N/A 07/25/2015   Procedure: VIDEO BRONCHOSCOPY;  Surgeon: [name], MD;  [facility] OR;  Service: Thoracic;  Laterality: N/A;    Current Medications: Outpatient Medications Prior to Visit  Medication Sig Dispense Refill  . atorvastatin (LIPITOR) 10 MG tablet TAKE 1 TABLET BY MOUTH  EVERY DAY 90 tablet 3  . cetirizine (ZYRTEC) 10 MG tablet Take 10 mg by mouth daily.    . clopidogrel (PLAVIX) 75 MG tablet Take 75 mg by mouth daily.    . diazepam (VALIUM) 5 MG tablet Take 1 tablet (5 mg total) by mouth 2 (two) times daily as needed for anxiety. 180 tablet 1  . escitalopram (LEXAPRO) 10 MG tablet TAKE 1 TABLET BY MOUTH  DAILY 90 tablet 2  . glucose blood (PRECISION XTRA TEST STRIPS) test strip Check blood sugar three times a day dx 250.01 270 each 3  . ibuprofen (ADVIL,MOTRIN) 200 MG tablet Take 200 mg by mouth every 6 (six) hours as needed.    . insulin NPH-regular Human (HUMULIN 70/30) (70-30) 100 UNIT/ML injection Inject 180 Units daily with breakfast into the skin. Inject 200 units into skin daily with breakfast. 6 vial 11  . Insulin Syringe-Needle U-100 30G X 3/8" 1 ML MISC Use to inject insulin 3 times daily. 300 each 1  . Multiple Vitamin (MULTIVITAMIN) tablet Take 1 tablet by mouth daily.    . RABEprazole (ACIPHEX) 20 MG tablet TAKE 1 TABLET(20 MG) BY MOUTH TWICE DAILY 60 tablet 3  . sildenafil (REVATIO) 20 MG tablet 1-5 tabs as needed for ED symptoms 100 tablet 11  . traMADol (ULTRAM) 50 MG tablet Take 1 tablet (50 mg total) every 6 (six) hours as needed by mouth (headache). 30 tablet 0   Facility-Administered Medications Prior to Visit  Medication Dose Route Frequency  Provider Last Rate Last Dose  . 0.9 %  sodium chloride infusion  500 mL Intravenous Continuous Irene Shipper, MD         Allergies:   Patient has no known allergies.   Social History   Socioeconomic History  . Marital status: Married    Spouse name: None  . Number of children: 2  . Years of education: None  . Highest education level: None  Social Needs  . Financial resource strain: None  . Food insecurity - worry: None  . Food insecurity - inability: None  . Transportation needs - medical: None  . Transportation needs - non-medical: None  Occupational History  . Occupation: retired    Fish farm manager: [employer]    Comment: works Youth worker  Tobacco Use  . Smoking status: Former Smoker  Packs/day: 1.00    Years: 35.00    Pack years: 35.00    Types: Cigarettes    Last attempt to quit: 10/31/2008    Years since quitting: 8.7  . Smokeless tobacco: Never Used  Substance and Sexual Activity  . Alcohol use: No    Alcohol/week: 0.0 oz  . Drug use: No  . Sexual activity: Not Currently    Partners: Female  Other Topics Concern  . 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   Review of Systems:   Please see the history of present illness.     General:  No chills, fever, night sweats or weight changes. Positive for fatigue.  Cardiovascular:  No edema, orthopnea, palpitations, paroxysmal nocturnal dyspnea. Positive for chest pain and dyspnea on exertion.  Dermatological: No rash, lesions/masses Respiratory: No cough, dyspnea Urologic: No hematuria, dysuria Abdominal:   No nausea, vomiting, diarrhea,  bright red blood per rectum, melena, or hematemesis Neurologic:  No visual changes, wkns, changes in mental status. All other systems reviewed and are otherwise negative except as noted above.   Physical Exam:    VS:  BP 128/60   Pulse (!) 102   Ht '6\' 4"'$  (1.93 m)   Wt 232 lb (105.2 kg)   SpO2 94%   BMI 28.24 kg/m    General: Well developed, well nourished Caucasian male appearing in no acute distress. Head: Normocephalic, atraumatic, sclera non-icteric, no xanthomas, nares are without discharge.  Neck: No carotid bruits. JVD not elevated.  Lungs: Respirations regular and unlabored, without wheezes or rales.  Heart: Regular rate and rhythm. No S3 or S4.  No murmur, no rubs, or gallops appreciated. Abdomen: Soft, non-tender, non-distended with normoactive bowel sounds. No hepatomegaly. No rebound/guarding. No obvious abdominal masses. Msk:  Strength and tone appear normal for age. No joint deformities or effusions. Extremities: No clubbing or cyanosis. No lower extremity edema.  Distal pedal pulses are 2+ bilaterally. Neuro: Alert and oriented X 3. Moves all extremities spontaneously. No focal deficits noted. Psych:  Responds to questions appropriately with a normal affect. Skin: No rashes or lesions noted  Wt Readings from Last 3 Encounters:  07/16/17 232 lb (105.2 kg)  07/03/17 237 lb (107.5 kg)  06/30/17 240 lb 9.6 oz (109.1 kg)     Studies/Labs Reviewed:   EKG:  EKG is ordered today.  The EKG ordered today demonstrates sinus tachycardia, HR 102 with PAC's. LVH with repol abnormality noted, similar to prior tracings.   Recent Labs: 10/28/2016: ALT 13; BUN 17; Creatinine, Ser 1.11; Potassium 4.3; Sodium 141 05/06/2017: Hemoglobin 13.5; Platelets 167.0; TSH 1.95   Lipid Panel    Component Value Date/Time   CHOL 142 10/28/2016 0821   TRIG 177.0 (H) 10/28/2016 0821   TRIG 84 06/30/2006 0809   HDL 32.90 (L) 10/28/2016 0821   CHOLHDL 4 10/28/2016 0821   VLDL 35.4 10/28/2016  0821   LDLCALC 74 10/28/2016 0821   LDLDIRECT 93.0 12/02/2014 1415    Additional studies/ records that were reviewed today include:   NST: 01/2015  Myocardial perfusion is normal. Small mild basal inferior fixed attenuation artifact. No  reversible ischemia. This is a low risk study. Overall left ventricular systolic function was normal. LV cavity size is normal. The left ventricular ejection fraction is mildly decreased (51%). There is no prior study for comparison.     Assessment:    1. Dyspnea on exertion   2. Precordial chest pain   3. Elevated troponin   4. Fatigue, unspecified type   5. Essential hypertension   6. Hyperlipidemia, unspecified hyperlipidemia type   7. EMPHYSEMA      Plan:   In order of problems listed above:  1. Dyspnea on Exertion/ Precordial Chest pain/ Elevated Troponin/ Fatigue - the patient reports a variety of symptoms with the most prominent being worsening fatigue and dyspnea on exertion for the past several months. Initially noticed this following his surgery in 2016 but feels like symptoms have worsened. Notes occasional episodes of chest pain which typically occurs with coughing and no association with exertion.  -  He does have an abnormal EKG at baseline which seems most consistent with LVH and repol abnormalities. Today's tracing is similar to previous ones. He has experienced at least two hospitalizations over the past year at which times cardiac enzymes were elevated in the setting of acute conditions (anemia in the setting of GIB in 2017 and AMS in 05/2017).  - He does have several cardiac risk factors including HTN, HLD, IDDM, and family history of CAD. His last ischemic evaluation was a NST in 2016 which was low-risk. He reports not tolerating the test well which was likely exacerbated by his COPD. Will obtain a Coronary CT for initial evaluation.  - he was recently started on Plavix at the time of his Emergency Dept visit. He does not have  indications at this time for the medication from a cardiac perspective due to no known CAD. I informed the patient he should check with his Neurologist prior to discontinuing the medication as it was likely started in the setting of his presumed TIA.  2. HTN - BP is well-controlled at 128/60 during today's visit.  - not currently on any anti-hypertensive medications. Continue to follow.   3. HLD - followed by PCP. He remains on Atorvastatin '40mg'$  daily.   4. COPD - no active wheezing on examination today. Tachycardiac upon arrival but HR improved into the 90's with rest. - followed by Pulmonology.   Medication Adjustments/Labs and Tests Ordered: Current medicines are reviewed at length with the patient today.  Concerns regarding medicines are outlined above.  Medication changes, Labs and Tests ordered today are listed in the Patient Instructions below. Patient Instructions  Medication Instructions:  Your physician recommends that you continue on your current medications as directed. Please refer to the Current Medication list given to you today.  Labwork: Do not complete labs until your test has been scheduled; complete a BMP  Testing/Procedures: Your physician has requested that you have cardiac CT. Cardiac computed tomography (CT) is a painless test that uses an x-ray machine to take clear, detailed pictures of your heart. For further information please visit [facility]. Please follow instruction sheet as given.  Once test has been approved by your insurance you will received a call to set up test. Follow-Up: Your physician wants you to follow-up in: 6 months with Dr Stanford Breed. You will receive a reminder letter in the mail two months in advance. If you don't receive a letter, please call our office to schedule the follow-up appointment.  Any Other Special Instructions Will Be Listed Below (If Applicable).  If you need a refill on your cardiac medications before your next  appointment, please call your pharmacy.   Signed, Erma Heritage, PA-C  07/16/2017 7:18 PM    [address], [place] [place], [place]  [address] Phone: [phone]; Fax: [phone]  [address], [place], [place] [address] Phone: [phone]

## 2017-07-16 ENCOUNTER — Ambulatory Visit: Payer: Self-pay | Admitting: Internal Medicine

## 2017-07-16 ENCOUNTER — Ambulatory Visit (INDEPENDENT_AMBULATORY_CARE_PROVIDER_SITE_OTHER): Payer: Medicare Other | Admitting: Student

## 2017-07-16 ENCOUNTER — Encounter: Payer: Self-pay | Admitting: Student

## 2017-07-16 VITALS — BP 128/60 | HR 102 | Ht 76.0 in | Wt 232.0 lb

## 2017-07-16 DIAGNOSIS — J383 Other diseases of vocal cords: Secondary | ICD-10-CM | POA: Diagnosis not present

## 2017-07-16 DIAGNOSIS — R5383 Other fatigue: Secondary | ICD-10-CM | POA: Diagnosis not present

## 2017-07-16 DIAGNOSIS — R072 Precordial pain: Secondary | ICD-10-CM

## 2017-07-16 DIAGNOSIS — E785 Hyperlipidemia, unspecified: Secondary | ICD-10-CM

## 2017-07-16 DIAGNOSIS — I10 Essential (primary) hypertension: Secondary | ICD-10-CM | POA: Diagnosis not present

## 2017-07-16 DIAGNOSIS — J438 Other emphysema: Secondary | ICD-10-CM

## 2017-07-16 DIAGNOSIS — R0609 Other forms of dyspnea: Secondary | ICD-10-CM

## 2017-07-16 DIAGNOSIS — R748 Abnormal levels of other serum enzymes: Secondary | ICD-10-CM

## 2017-07-16 DIAGNOSIS — R778 Other specified abnormalities of plasma proteins: Secondary | ICD-10-CM

## 2017-07-16 DIAGNOSIS — Z87891 Personal history of nicotine dependence: Secondary | ICD-10-CM | POA: Diagnosis not present

## 2017-07-16 DIAGNOSIS — J3801 Paralysis of vocal cords and larynx, unilateral: Secondary | ICD-10-CM | POA: Diagnosis not present

## 2017-07-16 DIAGNOSIS — R7989 Other specified abnormal findings of blood chemistry: Secondary | ICD-10-CM

## 2017-07-16 MED ORDER — METOPROLOL TARTRATE 50 MG PO TABS: ORAL_TABLET | ORAL | 0 refills | Status: DC

## 2017-07-16 NOTE — Patient Instructions (Addendum)
Medication Instructions:  Your physician recommends that you continue on your current medications as directed. Please refer to the Current Medication list given to you today.  Labwork: Do not complete labs until your test has been scheduled; complete a BMP  Testing/Procedures: Your physician has requested that you have cardiac CT. Cardiac computed tomography (CT) is a painless test that uses an x-ray machine to take clear, detailed pictures of your heart. For further information please visit [facility]. Please follow instruction sheet as given.  Once test has been approved by your insurance you will received a call to set up test. Follow-Up: Your physician wants you to follow-up in: 6 months with Dr Stanford Breed. You will receive a reminder letter in the mail two months in advance. If you don't receive a letter, please call our office to schedule the follow-up appointment.  Any Other Special Instructions Will Be Listed Below (If Applicable).  If you need a refill on your cardiac medications before your next appointment, please call your pharmacy.  Please arrive at the [facility] main entrance of [facility] at    ___:____AM/PM (30-45 minutes prior to test start time)  [facility]  [place], [place] [address]  [phone]   Proceed to the [facility] Radiology Department (First Floor).   Please follow these instructions carefully (unless otherwise directed):   Hold all erectile dysfunction medications at least 48 hours prior to test.   On the Night Before the Test:  * Drink plenty of water.  * Do not consume any caffeinated/decaffeinated beverages or chocolate 12 hours prior to your test.  * Do not take any antihistamines 12 hours prior to your test.  * If you take Metformin do not take 24 hours prior to test.  * If the patient has contrast allergy:  > Patient will need a prescription for Prednisone and very clear instructions (as follows):   ** Prednisone 50 mg - take 13 hours prior to test  ** Take another Prednisone 50 mg 7 hours prior to test  ** Take another Prednisone 50 mg 1 hour prior to test  ** Take Benadryl 50 mg 1 hour prior to test  * Patient must complete all four doses of above prophylactic medications.  * Patient will need a ride after test due to Benadryl.   On the Day of the Test:  * Drink plenty of water. Do not drink any water within one hour of the test.  * Do not eat any food 4 hours prior to the test.  * You may take your regular medications prior to the test.  * IF NOT ON A BETA BLOCKER - Take 50 mg of lopressor (metoprolol) one hour before the test.  * HOLD Furosemide morning of the test.   After the Test:  * Drink plenty of water.  * After receiving IV contrast, you may experience a mild flushed feeling. This is normal.  * On occasion, you may experience a mild rash up to 24 hours after the test. This is not dangerous. If this occurs, you can take Benadryl 25 mg and increase your fluid intake.  * If you experience trouble breathing, this can be serious. If it is severe call 911 IMMEDIATELY. If it is mild, please call our office.  * If you take any of these medications: Glipizide/Metformin, Avandament, Glucavance, please do not take 48 hours after completing test.

## 2017-07-18 ENCOUNTER — Other Ambulatory Visit: Payer: Self-pay | Admitting: Otolaryngology

## 2017-07-18 DIAGNOSIS — J3800 Paralysis of vocal cords and larynx, unspecified: Secondary | ICD-10-CM

## 2017-07-25 ENCOUNTER — Ambulatory Visit
Admission: RE | Admit: 2017-07-25 | Discharge: 2017-07-25 | Disposition: A | Discharge status: Home / Self Care | Payer: Medicare Other | Source: Ambulatory Visit | Attending: Otolaryngology | Admitting: Otolaryngology

## 2017-07-25 DIAGNOSIS — J3800 Paralysis of vocal cords and larynx, unspecified: Secondary | ICD-10-CM

## 2017-07-25 DIAGNOSIS — R221 Localized swelling, mass and lump, neck: Secondary | ICD-10-CM | POA: Diagnosis not present

## 2017-07-25 MED ORDER — IOPAMIDOL (ISOVUE-300) INJECTION 61%
75.0000 mL | Freq: Once | INTRAVENOUS | Status: AC | PRN
  Administered 2017-07-25: 75 mL via INTRAVENOUS

## 2017-07-31 ENCOUNTER — Telehealth: Payer: Self-pay | Admitting: *Deleted

## 2017-07-31 ENCOUNTER — Encounter: Payer: Self-pay | Admitting: Cardiothoracic Surgery

## 2017-07-31 NOTE — Telephone Encounter (Addendum)
Message from [place] with Dr. Janace Hoard reporting pt has recurrence of esophageal cancer on CT. Scan was ordered to evaluate vocal chord paralysis. Message to MD for review/ appt.

## 2017-08-01 NOTE — Telephone Encounter (Signed)
Message from wife reporting pt has a new mass, asked if MD has heard from Dr. Janace Hoard. Returned call, appointment given for 08/05/17 @ 2:15 with APP. She voiced appreciation for return call.

## 2017-08-05 ENCOUNTER — Ambulatory Visit (HOSPITAL_BASED_OUTPATIENT_CLINIC_OR_DEPARTMENT_OTHER): Payer: Medicare Other | Admitting: Nurse Practitioner

## 2017-08-05 ENCOUNTER — Telehealth: Payer: Self-pay

## 2017-08-05 ENCOUNTER — Ambulatory Visit (HOSPITAL_BASED_OUTPATIENT_CLINIC_OR_DEPARTMENT_OTHER): Payer: Medicare Other

## 2017-08-05 ENCOUNTER — Telehealth: Payer: Self-pay | Admitting: Oncology

## 2017-08-05 VITALS — BP 114/65 | HR 102 | Temp 97.9°F | Resp 19 | Ht 76.0 in | Wt 232.4 lb

## 2017-08-05 DIAGNOSIS — I10 Essential (primary) hypertension: Secondary | ICD-10-CM

## 2017-08-05 DIAGNOSIS — R519 Headache, unspecified: Secondary | ICD-10-CM

## 2017-08-05 DIAGNOSIS — C159 Malignant neoplasm of esophagus, unspecified: Secondary | ICD-10-CM

## 2017-08-05 DIAGNOSIS — C160 Malignant neoplasm of cardia: Secondary | ICD-10-CM | POA: Diagnosis present

## 2017-08-05 DIAGNOSIS — R51 Headache: Secondary | ICD-10-CM | POA: Diagnosis not present

## 2017-08-05 LAB — CBC WITH DIFFERENTIAL/PLATELET
BASO%: 0.3 % (ref 0.0–2.0)
BASOS ABS: 0 10*3/uL (ref 0.0–0.1)
EOS ABS: 0.4 10*3/uL (ref 0.0–0.5)
EOS%: 4 % (ref 0.0–7.0)
HEMATOCRIT: 39.2 % (ref 38.4–49.9)
HEMOGLOBIN: 12.9 g/dL — AB (ref 13.0–17.1)
LYMPH#: 2.5 10*3/uL (ref 0.9–3.3)
LYMPH%: 25.7 % (ref 14.0–49.0)
MCH: 31.8 pg (ref 27.2–33.4)
MCHC: 32.9 g/dL (ref 32.0–36.0)
MCV: 96.6 fL (ref 79.3–98.0)
MONO#: 0.6 10*3/uL (ref 0.1–0.9)
MONO%: 5.7 % (ref 0.0–14.0)
NEUT%: 64.3 % (ref 39.0–75.0)
NEUTROS ABS: 6.2 10*3/uL (ref 1.5–6.5)
Platelets: 160 10*3/uL (ref 140–400)
RBC: 4.06 10*6/uL — ABNORMAL LOW (ref 4.20–5.82)
RDW: 13.2 % (ref 11.0–14.6)
WBC: 9.6 10*3/uL (ref 4.0–10.3)

## 2017-08-05 LAB — COMPREHENSIVE METABOLIC PANEL
ALT: 27 U/L (ref 0–55)
ANION GAP: 10 meq/L (ref 3–11)
AST: 28 U/L (ref 5–34)
Albumin: 3.6 g/dL (ref 3.5–5.0)
Alkaline Phosphatase: 95 U/L (ref 40–150)
BUN: 18.3 mg/dL (ref 7.0–26.0)
CALCIUM: 9.5 mg/dL (ref 8.4–10.4)
CHLORIDE: 101 meq/L (ref 98–109)
CO2: 27 meq/L (ref 22–29)
CREATININE: 1.3 mg/dL (ref 0.7–1.3)
EGFR: 58 mL/min/{1.73_m2} — AB (ref >60)
Glucose: 184 mg/dl — ABNORMAL HIGH (ref 70–140)
POTASSIUM: 4 meq/L (ref 3.5–5.1)
Sodium: 138 mEq/L (ref 136–145)
Total Bilirubin: 0.5 mg/dL (ref 0.20–1.20)
Total Protein: 8 g/dL (ref 6.4–8.3)

## 2017-08-05 NOTE — Progress Notes (Addendum)
[facility] OFFICE PROGRESS NOTE   Diagnosis: Gastroesophageal cancer  INTERVAL HISTORY:   Ronald Johnson returns prior to scheduled follow-up.  He reports an approximate 7-week history of hoarseness.  He underwent a neck CT on 07/25/2017.  The scan showed an indistinct locally invasive 4-5 cm mass at the thoracic inlet region on the right.  There were slightly prominent mediastinal lymph nodes and questionable small pulmonary nodules.  A submandibular mass was increased in size with a current measurement of 12 mm.  He continues to have hoarseness.  Dysphagia is unchanged.  He tolerates liquids without difficulty.  He reports a good appetite.  He had headaches throughout the month of October.  He continues to have intermittent headaches.  Objective:  Vital signs in last 24 hours:  Blood pressure 114/65, pulse (!) 102, temperature 97.9 F (36.6 C), temperature source Oral, resp. rate 19, height 6\' 4"  (1.93 m), weight 232 lb 6.4 oz (105.4 kg), SpO2 94 %.    HEENT: No thrush or ulcers.  Neck without mass. Lymphatics: No palpable cervical, supraclavicular or axillary lymph nodes.  1 cm mobile nodule in the region of the right submandibular gland. Resp: Distant breath sounds.  No respiratory distress. Cardio: Regular rate and rhythm. GI: Abdomen soft and nontender.  No hepatomegaly. Vascular: No leg edema.    Lab Results:  Lab Results  Component Value Date   WBC 6.5 05/06/2017   HGB 13.5 05/06/2017   HCT 41.6 05/06/2017   MCV 96.7 05/06/2017   PLT 167.0 05/06/2017   NEUTROABS 3.9 05/06/2017    Imaging:  No results found.  Medications: I have reviewed the patient's current medications.  Assessment/Plan: 1. 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 EUS 03/16/2015 confirmed a clinical stage IIb (uT3,uN1) tumor ? Staging CTs of the chest, abdomen, and pelvis with no evidence of metastatic disease ? Initiation of radiation  04/19/2015, cycle 1 Taxol/carboplatin 04/20/2015: Radiation completed 05/29/2015; the fifth and final week of Taxol/carboplatin 05/18/2015 ? Restaging CTs 07/20/2015 revealed no evidence of metastatic disease, incidental left lower lobe pulmonary embolism ? Transhiatal total esophagectomy 07/25/2015 confirmed a ypT3, ypN2 with 4/5 positive lymph nodes, lymphovascular invasion, perineural invasion, negative resection margins ? CT neck 07/25/2017-4-5 cm mass in the right lower neck upper mediastinum immediately adjacent to the esophageal anastomosis.  12 mm mass within the right parotid enlarged since the previous PET study where it was about half that size.  Question few small pulmonary nodules.  Borderline enlarged mediastinal lymph nodes.  2. Postprandial subxiphoid pain secondary to #1  3. Diabetes-elevated blood sugar readings at the [facility] and at home  4. COPD  5. Depression-Improved with Wellbutrin  6. Hypertension  7. History of adenomatous colon polyps  8. History of diabetic related neuropathy, peripheral arterial disease, and nephropathy  9. Nausea-likely related to radiation esophagitis/gastritis  10. Incidental pulmonary embolism noted on the CT 07/20/2015, treated with Xarelto  11. Admission 08/18/2015 with GI bleeding while on Xarelto, anticoagulation therapy discontinued  12. Dysphagia-improved with esophageal dilatation procedures by Dr. Henrene Pastor  13. History of Mild thrombocytopenia  14. CT scan abdomen/pelvis 10/09/2016 (done to evaluate persistent nausea, epigastric pain)-no acute findings or explanation for the patient's symptoms. Resolving postsurgical changes from presumed previous esophagectomy and gastric pull-through. No evidence of metastatic disease.    Disposition: Ronald Johnson appears stable.  He noted onset of hoarseness about 7 weeks ago.  Recent neck CT shows a mass at the right lower neck/upper chest.  Dr. [name]  reviewed the CT report/images with Ronald Johnson and his wife at today's visit.  They understand this may indicate recurrence of the esophagus cancer. We are referring him for CT scans chest, abdomen, pelvis and brain.  He will return for a follow-up visit 08/08/2017 to review those results and decide about proceeding with a biopsy.  Patient seen with Dr. [name].  25 minutes were spent face-to-face at today's visit with the majority of that time involved in counseling/coordination of care.    Ned Card ANP/GNP-BC   08/05/2017  2:32 PM This was a shared visit with Ned Card.  Ronald Johnson was interviewed and examined.  He appears to have recurrence of esophagus cancer involving an upper chest mass.  On examination there is fullness in the right lower neck without a discrete mass.  We reviewed the CT images with Ronald Johnson.  He will be referred for additional staging evaluation and return for an office visit 08/08/2017.  We will discuss a diagnostic biopsy and systemic treatment options when he returns 08/08/2017.  Julieanne Manson, MD

## 2017-08-05 NOTE — Telephone Encounter (Signed)
Per Ned Card, NP, pt to go to [facility] for CT of head, chest, abdomen, and pelvis tomorrow STAT. Spoke with staff member at [facility] who said that the patient could be a walk-in tomorrow, he just needs to be NPO 4 hours prior to imaging. Prior authorization not necessary with pt insurance per [facility]. Pt called and left VM on home phone with instructions for tomorrow.

## 2017-08-05 NOTE — Telephone Encounter (Signed)
Gave avs and calendar for december °

## 2017-08-06 ENCOUNTER — Ambulatory Visit
Admission: RE | Admit: 2017-08-06 | Discharge: 2017-08-06 | Disposition: A | Discharge status: Home / Self Care | Payer: Medicare Other | Source: Ambulatory Visit | Attending: Oncology | Admitting: Oncology

## 2017-08-06 DIAGNOSIS — C159 Malignant neoplasm of esophagus, unspecified: Secondary | ICD-10-CM

## 2017-08-06 DIAGNOSIS — R51 Headache: Secondary | ICD-10-CM

## 2017-08-06 DIAGNOSIS — R918 Other nonspecific abnormal finding of lung field: Secondary | ICD-10-CM | POA: Diagnosis not present

## 2017-08-06 DIAGNOSIS — C155 Malignant neoplasm of lower third of esophagus: Secondary | ICD-10-CM | POA: Diagnosis not present

## 2017-08-06 DIAGNOSIS — R519 Headache, unspecified: Secondary | ICD-10-CM

## 2017-08-06 LAB — CEA (IN HOUSE-CHCC): CEA (CHCC-In House): 23.92 ng/mL — ABNORMAL HIGH (ref 0.00–5.00)

## 2017-08-06 MED ORDER — IOPAMIDOL (ISOVUE-300) INJECTION 61%: 125.0000 mL | Freq: Once | INTRAVENOUS | Status: DC | PRN

## 2017-08-07 ENCOUNTER — Encounter: Payer: Medicare Other | Admitting: Cardiothoracic Surgery

## 2017-08-08 ENCOUNTER — Encounter: Payer: Self-pay | Admitting: Nurse Practitioner

## 2017-08-08 ENCOUNTER — Telehealth: Payer: Self-pay | Admitting: Oncology

## 2017-08-08 ENCOUNTER — Ambulatory Visit (HOSPITAL_BASED_OUTPATIENT_CLINIC_OR_DEPARTMENT_OTHER): Payer: Medicare Other | Admitting: Nurse Practitioner

## 2017-08-08 VITALS — BP 119/64 | HR 95 | Temp 97.7°F | Resp 20 | Ht 76.0 in | Wt 233.4 lb

## 2017-08-08 DIAGNOSIS — C160 Malignant neoplasm of cardia: Secondary | ICD-10-CM

## 2017-08-08 MED ORDER — LEVOFLOXACIN 500 MG PO TABS: 500.0000 mg | ORAL_TABLET | Freq: Every day | ORAL | 0 refills | Status: DC

## 2017-08-08 NOTE — Progress Notes (Addendum)
Ronald Johnson OFFICE PROGRESS NOTE   Diagnosis: Gastroesophageal cancer  INTERVAL HISTORY:   Ronald Johnson returns for follow-up.  He continues to have hoarseness.  He has dysphagia.  He notes that he coughs with eating.  Dyspnea on exertion is stable.  He has a periodic low-grade fever.  Objective:  Vital signs in last 24 hours:  Blood pressure 119/64, pulse 95, temperature 97.7 F (36.5 C), temperature source Oral, resp. rate 20, height 6\' 4"  (1.93 m), weight 233 lb 6.4 oz (105.9 kg), SpO2 95 %.    HEENT: No thrush or ulcers. Resp: Distant breath sounds. Cardio: Regular rate and rhythm. GI: No hepatomegaly. Vascular: No leg edema.   Lab Results:  Lab Results  Component Value Date   WBC 9.6 08/05/2017   HGB 12.9 (L) 08/05/2017   HCT 39.2 08/05/2017   MCV 96.6 08/05/2017   PLT 160 08/05/2017   NEUTROABS 6.2 08/05/2017    Imaging:  No results found.  Medications: I have reviewed the patient's current medications.  Assessment/Plan: 1. 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 EUS 03/16/2015 confirmed a clinical stage IIb (uT3,uN1) tumor ? Staging CTs of the chest, abdomen, and pelvis with no evidence of metastatic disease ? Initiation of radiation 04/19/2015, cycle 1 Taxol/carboplatin 04/20/2015: Radiation completed 05/29/2015; the fifth and final week of Taxol/carboplatin 05/18/2015 ? Restaging CTs 07/20/2015 revealed no evidence of metastatic disease, incidental left lower lobe pulmonary embolism ? Transhiatal total esophagectomy 07/25/2015 confirmed a ypT3, ypN2 with 4/5 positive lymph nodes, lymphovascular invasion, perineural invasion, negative resection margins ? CT neck 07/25/2017-4-5 cm mass in the right lower neck upper mediastinum immediately adjacent to the esophageal anastomosis.  12 mm mass within the right parotid enlarged since the previous PET study where it was about half that size.  Question few small  pulmonary nodules.  Borderline enlarged mediastinal lymph nodes. ? CTs brain/chest/abdomen/pelvis 08/06/2017-ill-defined mass at the right thoracic inlet extending into the right superior mediastinum; new bilateral but right much greater than left peripheral reticulonodular and groundglass opacities; scattered small paratracheal and anterior mediastinal lymph nodes; small bilateral axillary lymph nodes.  Brain CT with no evidence of metastasis.  2. Postprandial subxiphoid pain secondary to #1  3. Diabetes-elevated blood sugar readings at the [facility] and at home  4. COPD  5. Depression-Improved with Wellbutrin  6. Hypertension  7. History of adenomatous colon polyps  8. History of diabetic related neuropathy, peripheral arterial disease, and nephropathy  9. Nausea-likely related to radiation esophagitis/gastritis  10. Incidental pulmonary embolism noted on the CT 07/20/2015, treated with Xarelto  11. Admission 08/18/2015 with GI bleeding while on Xarelto, anticoagulation therapy discontinued  12. Dysphagia-improved with esophageal dilatation procedures by Dr. Henrene Pastor  13.History ofMild thrombocytopenia  14. CT scan abdomen/pelvis 10/09/2016 (done to evaluate persistent nausea, epigastric pain)-no acute findings or explanation for the patient's symptoms. Resolving postsurgical changes from presumed previous esophagectomy and gastric pull-through. No evidence of metastatic disease.     Disposition: Ronald Johnson appears unchanged.  The recent CT scans show the mass at the right low neck/upper chest, small paratracheal and mediastinal lymph nodes and reticulo-nodular changes in both lungs right greater than left.  Dr. [name] reviewed the results/images with Ronald Johnson and his wife at today's visit.  They understand the findings may indicate recurrence of the gastroesophageal cancer.  Dr. [name] recommends referral for a biopsy to confirm the diagnosis.   Ronald Johnson understands the likely treatment recommendation will be for chemotherapy.  He is being referred for Port-A-Cath placement.  The reticulonodular changes in the lungs may be inflammatory/infectious related to aspiration.  Ronald Johnson understands the more nodular areas may represent cancer.  He will complete a 5-day course of Levaquin.  He will return for a follow-up visit on 08/20/2017.  He will contact the office in the interim with any problems.  Patient seen with Dr. [name].  25 minutes were spent face-to-face at today's visit with the majority of that time involved in counseling/coordination of care.    Ned Card ANP/GNP-BC   08/08/2017  11:29 AM  This was a shared visit with Ned Card.  We reviewed the CT images with Ronald Johnson and his wife.  He appears to have a local recurrence of esophagus cancer at the upper chest/lower neck.  Many of the lung lesions appear inflammatory, but some have a nodular component and could represent metastatic disease.  He will be referred for diagnostic biopsy and Port-A-Cath placement.  He will complete a course of Levaquin.  We will discuss systemic treatment options based on the biopsy finding.  Julieanne Manson, MD

## 2017-08-08 NOTE — Telephone Encounter (Signed)
Scheduled appt per 12/21 los - Gave patient AVS and calender per los.  

## 2017-08-11 ENCOUNTER — Telehealth: Payer: Self-pay | Admitting: *Deleted

## 2017-08-11 NOTE — Telephone Encounter (Signed)
Notified wife that 1/2 appt has been changed to 11AM. She voiced understanding.

## 2017-08-14 ENCOUNTER — Telehealth: Payer: Self-pay

## 2017-08-14 NOTE — Telephone Encounter (Signed)
Ok to hold plavix 

## 2017-08-14 NOTE — Telephone Encounter (Signed)
PLAVIX Spoke with patient wife regarding Plavix. States he was in hospital in October because of confusion. Concern for stroke but not confirmed. Plavix as a precaution for possible stroke.   BIOPSY During call wife had question of when biopsy will be. Told her she would receive call with further info  Will route messages to [place], and Rhys Martini.

## 2017-08-15 ENCOUNTER — Encounter: Payer: Self-pay | Admitting: Cardiothoracic Surgery

## 2017-08-15 ENCOUNTER — Telehealth: Payer: Self-pay

## 2017-08-15 ENCOUNTER — Ambulatory Visit (INDEPENDENT_AMBULATORY_CARE_PROVIDER_SITE_OTHER): Payer: Medicare Other | Admitting: Cardiothoracic Surgery

## 2017-08-15 ENCOUNTER — Other Ambulatory Visit: Payer: Self-pay

## 2017-08-15 VITALS — BP 126/74 | HR 113 | Ht 76.0 in | Wt 244.0 lb

## 2017-08-15 DIAGNOSIS — C159 Malignant neoplasm of esophagus, unspecified: Secondary | ICD-10-CM | POA: Diagnosis not present

## 2017-08-15 DIAGNOSIS — Z9049 Acquired absence of other specified parts of digestive tract: Secondary | ICD-10-CM | POA: Diagnosis not present

## 2017-08-15 DIAGNOSIS — Z9889 Other specified postprocedural states: Secondary | ICD-10-CM

## 2017-08-15 NOTE — Telephone Encounter (Signed)
Spoke with patient wife. OK to hold Plavix 5 days prior to port placement per Dr. [name]. Voiced understanding.

## 2017-08-17 ENCOUNTER — Encounter: Payer: Self-pay | Admitting: Cardiothoracic Surgery

## 2017-08-17 NOTE — Progress Notes (Signed)
[address]       [place] [address]             [phone]      Gedeon C Schwabe [place] Medical Record #[id number] Date of Birth: 07/22/1946  Referring: Ladell Pier, MD Primary Care: [name], MD  Chief Complaint:   POST OP FOLLOW UP 07/25/2015  OPERATIVE REPORT PREOPERATIVE DIAGNOSIS: Adenocarcinoma of the distal esophagus and gastroesophageal junction. 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 SURGICAL PROCEDURE: Bronchoscopy, transhiatal total esophagectomy with cervical esophagogastrostomy, pyloromyotomy, and placement of jejunal feeding tube. SURGEON: Lanelle Bal, MD.  GE junction carcinoma [facility])   Staging form: Stomach, AJCC 7th Edition     Clinical: Stage IIB (T3, N1, M0) - Signed by Ladell Pier, MD on 03/28/2015   Staging form: Esophagus - Adenocarcinoma, AJCC 7th Edition     Pathologic stage from 07/26/2015: Stage IIIB (yT3, N2, cM0, G3 - Poorly differentiated) - Signed by [name], MD on 07/28/2015  History of Present Illness:  Patient returns to the office today two years  after transhiatal esophagectomy for stage IIIB poorly differentiated carcinoma the distal esophagus.   He notes that 6 weeks ago he developed hoarseness and recent evaluation demonstrates  right vocal cord paralysis and ct scan a right neck superior mediastinal mass        Past Medical History:  Diagnosis Date  . Anemia 08/15/2009  . Anxiety   . Arthritis   . [name]'s esophagus 2007  . COLONIC POLYPS, ADENOMATOUS 08/01/2008, 2013, 2014  . COPD (chronic obstructive pulmonary disease) (HCC)    no per pt  . Depression   . Diabetes type 2, controlled ([place]) 03/13/2007  . Diverticulitis   . ED (erectile dysfunction)   . EMPHYSEMA 11/08/2008   no per pt  . Esophageal cancer ([place]) "dx'd ~ 01/2015"  . GERD 03/13/2007  . GIB (gastrointestinal bleeding) 08/18/2015  . Hiatal hernia   .  History of blood transfusion 08/18/2015   due to GIB  . HYPERCHOLESTEROLEMIA 02/03/2008  . HYPERTENSION 03/13/2007   pt denies, claims white coat syndrome  . Prostatism   . PULMONARY NODULE 11/24/2008  . Stomach cancer ([place]) "dx'd ~ 01/2015"  . TOBACCO ABUSE 11/24/2008     Social History   Tobacco Use  Smoking Status Former Smoker  . Packs/day: 1.00  . Years: 35.00  . Pack years: 35.00  . Types: Cigarettes  . Last attempt to quit: 10/31/2008  . Years since quitting: 8.8  Smokeless Tobacco Never Used    Social History   Substance and Sexual Activity  Alcohol Use No  . Alcohol/week: 0.0 oz     No Known Allergies  Current Outpatient Medications  Medication Sig Dispense Refill  . atorvastatin (LIPITOR) 10 MG tablet TAKE 1 TABLET BY MOUTH  EVERY DAY 90 tablet 3  . cetirizine (ZYRTEC) 10 MG tablet Take 10 mg by mouth daily.    . clopidogrel (PLAVIX) 75 MG tablet Take 75 mg by mouth daily.    . diazepam (VALIUM) 5 MG tablet Take 1 tablet (5 mg total) by mouth 2 (two) times daily as needed for anxiety. 180 tablet 1  . escitalopram (LEXAPRO) 10 MG tablet TAKE 1 TABLET BY MOUTH  DAILY 90 tablet 2  . glucose blood (PRECISION XTRA TEST STRIPS) test strip Check blood sugar three times a day dx 250.01 270 each 3  . ibuprofen (ADVIL,MOTRIN) 200 MG tablet Take 200  mg by mouth every 6 (six) hours as needed.    . insulin NPH-regular Human (HUMULIN 70/30) (70-30) 100 UNIT/ML injection Inject 180 Units daily with breakfast into the skin. Inject 200 units into skin daily with breakfast. 6 vial 11  . Insulin Syringe-Needle U-100 30G X 3/8" 1 ML MISC Use to inject insulin 3 times daily. 300 each 1  . metoprolol tartrate (LOPRESSOR) 50 MG tablet Take 1 tablet 1 hour before procedure. 1 tablet 0  . Multiple Vitamin (MULTIVITAMIN) tablet Take 1 tablet by mouth daily.    . RABEprazole (ACIPHEX) 20 MG tablet TAKE 1 TABLET(20 MG) BY MOUTH TWICE DAILY 60 tablet 3  . sildenafil (REVATIO) 20 MG tablet 1-5  tabs as needed for ED symptoms 100 tablet 11  . traMADol (ULTRAM) 50 MG tablet Take 1 tablet (50 mg total) every 6 (six) hours as needed by mouth (headache). 30 tablet 0   Current Facility-Administered Medications  Medication Dose Route Frequency Provider Last Rate Last Dose  . 0.9 %  sodium chloride infusion  500 mL Intravenous Continuous Irene Shipper, MD        Wt Readings from Last 3 Encounters:  08/15/17 244 lb (110.7 kg)  08/08/17 233 lb 6.4 oz (105.9 kg)  08/05/17 232 lb 6.4 oz (105.4 kg)   Filed Weights   08/15/17 1626  Weight: 244 lb (110.7 kg)    Physical Exam: BP 126/74   Pulse (!) 113   Ht 6\' 4"  (1.93 m)   Wt 244 lb (110.7 kg)   SpO2 92% Comment: room air  BMI 29.70 kg/m  General appearance: alert, cooperative and now hoarse  Head: Normocephalic, without obvious abnormality, atraumatic Neck: no adenopathy, no carotid bruit, no JVD, supple, symmetrical, trachea midline, thyroid not enlarged, symmetric, no tenderness/mass/nodules and with known right neck mass not palpable on exam  Lymph nodes: Cervical, supraclavicular, and axillary nodes normal. Resp: clear to auscultation bilaterally Back: symmetric, no curvature. ROM normal. No CVA tenderness. Cardio: regular rate and rhythm, S1, S2 normal, no murmur, click, rub or gallop GI: soft, non-tender; bowel sounds normal; no masses,  no organomegaly Extremities: extremities normal, atraumatic, no cyanosis or edema and Homans sign is negative, no sign of DVT Neurologic: Grossly normal Left neck incision healed  well without palpable mass  Abdominal incision healed  Diagnostic Studies & Laboratory data:     Recent Radiology Findings:  Ct Head W Wo Contrast  Result Date: 08/06/2017 CLINICAL DATA:  Esophageal cancer in 2016. Suspected recurrence on neck CT. Staging. EXAM: CT HEAD WITHOUT AND WITH CONTRAST TECHNIQUE: Contiguous axial images were obtained from the base of the skull through the vertex without and with  intravenous contrast CONTRAST:  125 cc Isovue 300 intravenous COMPARISON:  None. FINDINGS: Brain: No evidence of acute infarction, hemorrhage, hydrocephalus, extra-axial collection or mass lesion/mass effect. Extensive low-density in the cerebral white matter consistent with chronic small vessel ischemia given patient's vascular risk factors. Vascular: Atherosclerotic calcification. Skull: No evidence of metastasis Sinuses/Orbits: Negative. IMPRESSION: 1. No evidence of metastasis. 2. Chronic small vessel ischemic in the cerebral white matter. Electronically Signed   By: Monte Fantasia M.D.   On: 08/06/2017 09:42   Ct Soft Tissue Neck W Contrast  Result Date: 07/25/2017 CLINICAL DATA:  Vocal cord paralysis over the last 6 weeks. Dysphagia and hoarseness. History of esophageal cancer treated with surgery, chemotherapy and radiation. Creatinine was obtained on site at [facility] at [address]Results: Creatinine 1.2 mg/dL. EXAM: CT NECK  WITH CONTRAST TECHNIQUE: Multidetector CT imaging of the neck was performed using the standard protocol following the bolus administration of intravenous contrast. CONTRAST:  67mL ISOVUE-300 IOPAMIDOL (ISOVUE-300) INJECTION 61% COMPARISON:  PET scan 04/12/2015 FINDINGS: Pharynx and larynx: No mucosal or submucosal lesion is seen. There is evidence of vocal fold paresis or paralysis on the right with asymmetric dilatation of the piriform sinus, sail sign and medial positioning of the cord. There is a 4-5 cm mass in the right lower neck upper mediastinum medially adjacent to the esophageal anastomosis which does not respect tissue planes in is likely to represent locally invasive recurrent carcinoma. Salivary glands: Parotid glands are normal. Left submandibular gland is normal. There is a 12 mm mass within the right parotid, enlarged since the previous PET study where it was about half that size. This is presumed represent a low-grade submandibular neoplasm.  Thyroid: I think the thyroid gland is intrinsically normal, though the locally invasive mass comes very close to the inferior aspect of the right lobe. Lymph nodes: No distant lymph nodes in the neck. Vascular: Advanced atherosclerotic calcification at the carotid bifurcations. Scarring and narrowing of the right jugular vein, possibly locally involved by the mass at the right thoracic inlet region. Limited intracranial: Normal Visualized orbits: Not included Mastoids and visualized paranasal sinuses: Clear Skeleton: Ordinary spondylosis. Upper chest: No evidence of pneumonia. Emphysema at the apices. Question few small pulmonary nodules. Borderline enlarged mediastinal lymph nodes. Complete chest CT suggested. Other: Amorphus 1 x 2 cm calcification medially behind the right clavicle. This could be benign calcification perhaps related to an old hematoma. IMPRESSION: Indistinct, locally invasive 4-5 cm mass at the thoracic inlet region on the right likely to represent recurrent esophageal carcinoma. Leftward displacement of the upper esophagus and pull through. Likely recurrent laryngeal nerve involvement on the right with right vocal fold paresis or paralysis. Slightly prominent mediastinal lymph nodes and questionable small pulmonary nodules not well evaluated but of concern for the possibility of early metastatic disease in the chest. Full chest evaluation suggested. Enlargement of a right submandibular mass, now measuring 12 mm in diameter, most consistent with the low grade submandibular neoplasm given the change since the PET study of 2016. Electronically Signed   By: Nelson Chimes M.D.   On: 07/25/2017 16:29   Ct Chest W Contrast/Ct Abdomen Pelvis W Contrast  Result Date: 08/06/2017 CLINICAL DATA:  Distal esophageal cancer with gastric pull-through. Cough and chest pain for 2 weeks. EXAM: CT CHEST, ABDOMEN, AND PELVIS WITH CONTRAST TECHNIQUE: Multidetector CT imaging of the chest, abdomen and pelvis was  performed following the standard protocol during bolus administration of intravenous contrast. CONTRAST:  125 cc Isovue 300 COMPARISON:  Multiple exams, including neck CT of 07/25/2017 and abdomen CT of 08/08/2017 FINDINGS: CT CHEST FINDINGS Cardiovascular: Encasement of the right common carotid artery at the thoracic inlet by a 5.9 by 3.7 by 5.1 cm mass with ill-defined margins in knee right upper paratracheal and right paraesophageal space extending up into the supraclavicular region. Potential associated narrowing of the right common carotid artery along with mild mass effect on the right thyroid lobe. The mass tracks along the upper margin of the right subclavian artery and along the posteromedial margin of the right vertebral artery. Coronary, aortic arch, and branch vessel atherosclerotic vascular disease. Mediastinum/Nodes: As noted above there is a right upper mediastinal mass extending into the right supraclavicular region with mild leftward deviation of the trachea and esophagus. This mass appears to be in the  vicinity of the anastomosis from the gastric pull-through. Scattered small paratracheal and anterior mediastinal lymph nodes. A right hilar lymph node 1.4 cm in short axis, image 47/2, previously 0.7 cm. Subcarinal node 1.5 cm in short axis on image 56/2, formerly 1.0 cm. Right infrahilar nodal prominence up to 1 cm in short axis on image 61/2, new compared to the prior exam. Small bilateral axillary lymph nodes. Lungs/Pleura: Paraseptal emphysema. Scattered tree-in-bud reticulonodular opacities in the right upper lobe, right lower lobe, and right middle lobe with some areas of more confluent nodularity. Associated airway thickening noted. The appearance favors atypical infectious bronchiolitis, but with individual nodules up to about 1.4 cm in long axis, this clearly warrants surveillance. An index left upper lobe nodule posteriorly on image 46/6 measures 0.8 by 0.6 cm. A peripheral right lower lobe  nodule on image 92/6 measures 1.5 by 0.8 cm. An index right lower lobe nodule on image 99/6 measures 0.9 by 0.7 cm and was formerly present and measured 0.6 by 0.7 cm. There are similar but less striking ground-glass and reticulonodular opacities in the left upper lobe and left lower lobe which warrants surveillance. Bilateral airway thickening. Musculoskeletal: Thoracic spondylosis. CT ABDOMEN PELVIS FINDINGS Hepatobiliary: Unremarkable Pancreas: Unremarkable Spleen: Unremarkable Adrenals/Urinary Tract: Mild thickening of both adrenal glands without discrete mass. Multiple bilateral hypodense renal lesions favoring cysts, some of the renal lesions are technically too small to characterize. Vascular calcifications along both renal hila. Stomach/Bowel: Postoperative findings from gastric pull-through. Scattered diverticula of the descending colon. Vascular/Lymphatic: Aortoiliac atherosclerotic vascular disease. Small retroperitoneal lymph nodes are not pathologically enlarged. Small bilateral external iliac lymph nodes. Reproductive: Unremarkable Other: No supplemental non-categorized findings. Musculoskeletal: Mild lower lumbar degenerative disc disease and spondylosis. Mild levoconvex lumbar scoliosis with rotary component. IMPRESSION: 1. Ill-defined mass at the right thoracic inlet extending into the right superior mediastinum suspicious for recurrence, measuring 5.9 by 3.7 by 5.1 cm, encasing and narrowing the right common carotid artery, abutting the right subclavian and right vertebral arteries, and with some mass effect on the right thyroid lobe. 2. New bilateral but right much greater than left peripheral reticulonodular comment nodular, and ground-glass opacities. The overall pattern favors atypical infectious bronchiolitis, the given the associated nodularity, superimposed malignancy is difficult to exclude. There is associated adenopathy particularly on the right side which may be reactive or malignant.  Although most of the associated nodularity is new, an index right lower lobe nodule has mildly enlarged and currently measures 0.9 by 0.7 cm. 3. Other imaging findings of potential clinical significance: Airway thickening is present, suggesting bronchitis or reactive airways disease. Aortoiliac atherosclerotic vascular disease. Bilateral hypodense renal lesions favoring cysts. Electronically Signed   By: Van Clines M.D.   On: 08/06/2017 10:02   I have independently reviewed the above radiology studies  and reviewed the findings with the patient.   Recent Lab Findings: Lab Results  Component Value Date   WBC 9.6 08/05/2017   HGB 12.9 (L) 08/05/2017   HCT 39.2 08/05/2017   PLT 160 08/05/2017   GLUCOSE 184 (H) 08/05/2017   CHOL 142 10/28/2016   TRIG 177.0 (H) 10/28/2016   HDL 32.90 (L) 10/28/2016   LDLDIRECT 93.0 12/02/2014   LDLCALC 74 10/28/2016   ALT 27 08/05/2017   AST 28 08/05/2017   NA 138 08/05/2017   K 4.0 08/05/2017   CL 102 10/28/2016   CREATININE 1.3 08/05/2017   BUN 18.3 08/05/2017   CO2 27 08/05/2017   TSH 1.95 05/06/2017   INR  1.06 08/19/2015   HGBA1C 6.9 06/30/2017    Wt Readings from Last 3 Encounters:  08/15/17 244 lb (110.7 kg)  08/08/17 233 lb 6.4 oz (105.9 kg)  08/05/17 232 lb 6.4 oz (105.4 kg)     Assessment / Plan:  Patient now two years post op with likely recurrence in the right neck with vocal cord paralysis No evidence of brain mets on recent MRI. To have biopsy to confirm dx of mass  Patient asked about second option about further treatment - He will discuss with Dr [name].  Depending on the pre op radiation fields he may be candidate for additional local treatment  With radiation     [name] MD      [address] [place] [address] Office [phone]   [place]  08/17/2017 9:22 PM

## 2017-08-18 ENCOUNTER — Telehealth: Payer: Self-pay | Admitting: Nurse Practitioner

## 2017-08-18 NOTE — Telephone Encounter (Signed)
Ronald Bloodgood, do you want this patient scheduled for an EGD or would you want to see him again prior to scheduling? He is having a port placed on 08/22/17. That is when the Plavix will be held initially. I cannot get him in during that time period, but can certainly get with Dr Blanch Media nurse about a time and date. Please advise.

## 2017-08-18 NOTE — Telephone Encounter (Signed)
Patient wife calling to state patient has been off of BT plavix since Saturday and will be off of it for a while. Patient wife wanting to know if patient can now have endo done as discussed at ov on 11.15.18.

## 2017-08-20 ENCOUNTER — Other Ambulatory Visit: Payer: Self-pay | Admitting: Radiology

## 2017-08-20 ENCOUNTER — Other Ambulatory Visit: Payer: Self-pay

## 2017-08-20 ENCOUNTER — Encounter: Payer: Self-pay | Admitting: Oncology

## 2017-08-20 ENCOUNTER — Encounter: Payer: Self-pay | Admitting: *Deleted

## 2017-08-20 ENCOUNTER — Telehealth: Payer: Self-pay | Admitting: Oncology

## 2017-08-20 ENCOUNTER — Ambulatory Visit (HOSPITAL_BASED_OUTPATIENT_CLINIC_OR_DEPARTMENT_OTHER): Payer: Medicare Other | Admitting: Oncology

## 2017-08-20 VITALS — BP 151/71 | HR 92 | Temp 96.9°F | Resp 16 | Ht 76.0 in | Wt 232.6 lb

## 2017-08-20 DIAGNOSIS — C160 Malignant neoplasm of cardia: Secondary | ICD-10-CM | POA: Diagnosis not present

## 2017-08-20 NOTE — Telephone Encounter (Signed)
Ronald Johnson,  He does have a hx of esophageal strictures and we did discuss EGD if he was able to hold plavix which he planned to discuss at Cardiology visit. However, in the interim it looks like he saw ENT for hoarseness, had neck imaging and has a recurrence of esophageal cancer. He saw cardiothoracic surgeon who did his first surgery and is scheduled for biopsy of the mass. I don't think EGD is still needed now as the mass is probably the culprit. I'll forward this to Dr. Henrene Pastor to see if he has anything to add.

## 2017-08-20 NOTE — Telephone Encounter (Signed)
Gave patient avs and calendar with appts per 1/2 los.  °

## 2017-08-20 NOTE — Progress Notes (Addendum)
Head and Neck Cancer Location of Tumor / Histology:  Right sided lower  neck mass upper chest   (recurrence   Patient presented  months ago with symptoms of: Dysphagia   Biopsies of  (if applicable) revealed: scheduled 08/26/17   Nutrition Status Yes No Comments  Weight changes? [x]  []  10 lb loss  Swallowing concerns? yes []  dysphagia  PEG? []  [x]     Referrals Yes No Comments  Social Work? []  []    Dentistry? [x]  []  Dr. Nicki Reaper 2 months ago  Swallowing therapy? [x]  []  Dilatation  Dr. Henrene Pastor  Nutrition? [x]  []   drinks 2 ensure daily  Med/Onc? [x]  []   Dr. [name] 08/20/17   Safety Issues Yes No Comments  Prior radiation? yes []  04/19/15-05/29/15 Ge junction 50.4 gy  Pacemaker/ICD? []  [x]    Possible current pregnancy? []  []    Is the patient on methotrexate? []  [x]     Tobacco/Marijuana/Snuff/ETOH use: no, Past/Anticipated interventions by otolaryngology, if any:  Dr.Byers on [address] seen  Past/Anticipated interventions by medical oncology, if any: Dr. [name], MD seen 08/20/17,f/u with Ned Card, NP 09/05/17     Current Complaints / other details:  Radiation Ge junction/esophagus 04/01/15-05/29/15 50.4gray DR. Moody, COPD,Depression,hx adenomatous colon polyps, neuropathy  S/p DM,PE noted on CT 12/1.16   BP (!) 148/81   Pulse 95   Temp 98.1 F (36.7 C) (Oral)   Resp 20   Ht 6\' 4"  (1.93 m)   Wt 233 lb (105.7 kg)   SpO2 99%   BMI 28.36 kg/m   Wt Readings from Last 3 Encounters:  08/21/17 233 lb (105.7 kg)  08/20/17 232 lb 9.6 oz (105.5 kg)  08/15/17 244 lb (110.7 kg)

## 2017-08-20 NOTE — Progress Notes (Signed)
[facility] OFFICE PROGRESS NOTE   Diagnosis: Esophagus cancer  INTERVAL HISTORY:   Ronald Johnson returns as scheduled.  He continues to have hoarseness.  The cough improved with antibiotics.  He has dysphasia, but is tolerating liquids and some food.  He feels depressed.  The neck mass biopsy scheduled for 08/26/2017.  He saw Dr. Servando Snare.  Dr. Servando Snare feels he may be a candidate for additional radiation.  Objective:  Vital signs in last 24 hours:  Blood pressure (!) 151/71, pulse 92, temperature (!) 96.9 F (36.1 C), temperature source Oral, resp. rate 16, height 6\' 4"  (1.93 m), weight 232 lb 9.6 oz (105.5 kg), SpO2 95 %.    HEENT: Firm mass at the medial right supraclavicular fossa Resp: Distant breath sounds, no respiratory distress Cardio: Regular rate and rhythm GI: No hepatomegaly, nontender Vascular: No leg edema   Lab Results:  Lab Results  Component Value Date   WBC 9.6 08/05/2017   HGB 12.9 (L) 08/05/2017   HCT 39.2 08/05/2017   MCV 96.6 08/05/2017   PLT 160 08/05/2017   NEUTROABS 6.2 08/05/2017    CMP     Component Value Date/Time   NA 138 08/05/2017 1547   K 4.0 08/05/2017 1547   CL 102 10/28/2016 0821   CO2 27 08/05/2017 1547   GLUCOSE 184 (H) 08/05/2017 1547   GLUCOSE 129 (H) 06/30/2006 0809   BUN 18.3 08/05/2017 1547   CREATININE 1.3 08/05/2017 1547   CALCIUM 9.5 08/05/2017 1547   PROT 8.0 08/05/2017 1547   ALBUMIN 3.6 08/05/2017 1547   AST 28 08/05/2017 1547   ALT 27 08/05/2017 1547   ALKPHOS 95 08/05/2017 1547   BILITOT 0.50 08/05/2017 1547   GFRNONAA >60 08/19/2015 0508   GFRAA >60 08/19/2015 0508    Lab Results  Component Value Date   CEA1 23.92 (H) 08/05/2017    Medications: I have reviewed the patient's current medications.   Assessment/Plan: 1. 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 EUS 03/16/2015 confirmed a clinical stage IIb (uT3,uN1) tumor ? Staging CTs of the  chest, abdomen, and pelvis with no evidence of metastatic disease ? Initiation of radiation 04/19/2015, cycle 1 Taxol/carboplatin 04/20/2015: Radiation completed 05/29/2015; the fifth and final week of Taxol/carboplatin 05/18/2015 ? Restaging CTs 07/20/2015 revealed no evidence of metastatic disease, incidental left lower lobe pulmonary embolism ? Transhiatal total esophagectomy 07/25/2015 confirmed a ypT3, ypN2 with 4/5 positive lymph nodes, lymphovascular invasion, perineural invasion, negative resection margins ? CT neck 07/25/2017-4-5 cm mass in the right lower neck upper mediastinum immediately adjacent to the esophageal anastomosis. 12 mm mass within the right parotid enlarged since the previous PET study where it was about half that size. Question few small pulmonary nodules. Borderline enlarged mediastinal lymph nodes. ? CTs brain/chest/abdomen/pelvis 08/06/2017-ill-defined mass at the right thoracic inlet extending into the right superior mediastinum; new bilateral but right much greater than left peripheral reticulonodular and groundglass opacities; scattered small paratracheal and anterior mediastinal lymph nodes; small bilateral axillary lymph nodes.  Brain CT with no evidence of metastasis.  2. Postprandial subxiphoid pain secondary to #1  3. Diabetes-elevated blood sugar readings at the [facility] and at home  4. COPD  5. Depression-Improved with Wellbutrin  6. Hypertension  7. History of adenomatous colon polyps  8. History of diabetic related neuropathy, peripheral arterial disease, and nephropathy  9. Nausea-likely related to radiation esophagitis/gastritis  10. Incidental pulmonary embolism noted on the CT 07/20/2015, treated with Xarelto  11.  Admission 08/18/2015 with GI bleeding while on Xarelto, anticoagulation therapy discontinued  12. Dysphagia-improved with esophageal dilatation procedures by Dr. Henrene Pastor  13.History ofMild  thrombocytopenia  14. CT scan abdomen/pelvis 10/09/2016 (done to evaluate persistent nausea, epigastric pain)-no acute findings or explanation for the patient's symptoms. Resolving postsurgical changes from presumed previous esophagectomy and gastric pull-through. No evidence of metastatic disease.    Disposition: Mr. Pepper appears to have a local recurrence of esophagus cancer.  He is scheduled for biopsy of the upper chest/lower neck mass 08/26/2017.  I will refer him to radiation oncology.  He does not appear to have clear evidence of distant metastatic disease.  If he is a candidate for additional radiation we can prescribe concurrent capecitabine.  I reviewed the potential toxicities associated with capecitabine.  He will be scheduled to see Dr. Lisbeth Renshaw 08/21/2017.  We will proceed with capecitabine/radiation if Dr. Lisbeth Renshaw is in agreement.  We will plan for FOLFOX chemotherapy if Mr. Bound is not a candidate for further radiation.  We will follow-up on the result from the liver biopsy 08/26/2017.  He is scheduled for Port-A-Cath placement 08/22/2017.  We will cancel this procedure if he is scheduled for radiation.  I recommended he follow-up with Dr. Loanne Drilling to discuss an antidepressant.  Betsy Coder, MD  08/20/2017  11:30 AM

## 2017-08-20 NOTE — Telephone Encounter (Signed)
I agree with Nevin Bloodgood. EGD not appropriate given the new developments

## 2017-08-21 ENCOUNTER — Ambulatory Visit
Admission: RE | Admit: 2017-08-21 | Discharge: 2017-08-21 | Disposition: A | Discharge status: Home / Self Care | Payer: Medicare Other | Source: Ambulatory Visit | Attending: Radiation Oncology | Admitting: Radiation Oncology

## 2017-08-21 ENCOUNTER — Encounter: Payer: Self-pay | Admitting: Radiation Oncology

## 2017-08-21 ENCOUNTER — Encounter: Payer: Self-pay | Admitting: General Practice

## 2017-08-21 ENCOUNTER — Telehealth: Payer: Self-pay | Admitting: *Deleted

## 2017-08-21 VITALS — BP 148/81 | HR 95 | Temp 98.1°F | Resp 20 | Ht 76.0 in | Wt 233.0 lb

## 2017-08-21 DIAGNOSIS — Z803 Family history of malignant neoplasm of breast: Secondary | ICD-10-CM | POA: Diagnosis not present

## 2017-08-21 DIAGNOSIS — Z794 Long term (current) use of insulin: Secondary | ICD-10-CM | POA: Diagnosis not present

## 2017-08-21 DIAGNOSIS — E119 Type 2 diabetes mellitus without complications: Secondary | ICD-10-CM | POA: Diagnosis not present

## 2017-08-21 DIAGNOSIS — Z79899 Other long term (current) drug therapy: Secondary | ICD-10-CM | POA: Insufficient documentation

## 2017-08-21 DIAGNOSIS — Z8501 Personal history of malignant neoplasm of esophagus: Secondary | ICD-10-CM | POA: Diagnosis not present

## 2017-08-21 DIAGNOSIS — I700 Atherosclerosis of aorta: Secondary | ICD-10-CM | POA: Insufficient documentation

## 2017-08-21 DIAGNOSIS — R918 Other nonspecific abnormal finding of lung field: Secondary | ICD-10-CM | POA: Insufficient documentation

## 2017-08-21 DIAGNOSIS — I10 Essential (primary) hypertension: Secondary | ICD-10-CM | POA: Insufficient documentation

## 2017-08-21 DIAGNOSIS — C153 Malignant neoplasm of upper third of esophagus: Secondary | ICD-10-CM

## 2017-08-21 DIAGNOSIS — J449 Chronic obstructive pulmonary disease, unspecified: Secondary | ICD-10-CM | POA: Diagnosis not present

## 2017-08-21 DIAGNOSIS — Z510 Encounter for antineoplastic radiation therapy: Secondary | ICD-10-CM | POA: Diagnosis not present

## 2017-08-21 DIAGNOSIS — Z8719 Personal history of other diseases of the digestive system: Secondary | ICD-10-CM | POA: Diagnosis not present

## 2017-08-21 DIAGNOSIS — Z87891 Personal history of nicotine dependence: Secondary | ICD-10-CM | POA: Insufficient documentation

## 2017-08-21 DIAGNOSIS — Z800 Family history of malignant neoplasm of digestive organs: Secondary | ICD-10-CM | POA: Insufficient documentation

## 2017-08-21 DIAGNOSIS — E7800 Pure hypercholesterolemia, unspecified: Secondary | ICD-10-CM | POA: Diagnosis not present

## 2017-08-21 DIAGNOSIS — Z86010 Personal history of colonic polyps: Secondary | ICD-10-CM | POA: Insufficient documentation

## 2017-08-21 DIAGNOSIS — Z923 Personal history of irradiation: Secondary | ICD-10-CM | POA: Diagnosis not present

## 2017-08-21 DIAGNOSIS — K219 Gastro-esophageal reflux disease without esophagitis: Secondary | ICD-10-CM | POA: Insufficient documentation

## 2017-08-21 DIAGNOSIS — F419 Anxiety disorder, unspecified: Secondary | ICD-10-CM | POA: Diagnosis not present

## 2017-08-21 DIAGNOSIS — Z9221 Personal history of antineoplastic chemotherapy: Secondary | ICD-10-CM | POA: Diagnosis not present

## 2017-08-21 DIAGNOSIS — F418 Other specified anxiety disorders: Secondary | ICD-10-CM | POA: Insufficient documentation

## 2017-08-21 DIAGNOSIS — D649 Anemia, unspecified: Secondary | ICD-10-CM | POA: Insufficient documentation

## 2017-08-21 DIAGNOSIS — R221 Localized swelling, mass and lump, neck: Secondary | ICD-10-CM | POA: Diagnosis not present

## 2017-08-21 DIAGNOSIS — Z85028 Personal history of other malignant neoplasm of stomach: Secondary | ICD-10-CM | POA: Diagnosis not present

## 2017-08-21 MED ORDER — HYDROCODONE-ACETAMINOPHEN 7.5-325 MG/15ML PO SOLN: 5.0000 mL | Freq: Four times a day (QID) | ORAL | 0 refills | Status: DC | PRN

## 2017-08-21 MED ORDER — SUCRALFATE 1 G PO TABS: 1.0000 g | ORAL_TABLET | Freq: Three times a day (TID) | ORAL | 1 refills | Status: DC

## 2017-08-21 NOTE — Progress Notes (Signed)
Radiation Oncology         [phone] ________________________________  Name: Ronald Johnson        MRN: [id number]  Date of Service: 08/21/2017 DOB: [birth year]  OA:[id number], Hilliard Clark, MD  Ladell Pier, MD     REFERRING PHYSICIAN: Ladell Pier, MD   DIAGNOSIS: The encounter diagnosis was Malignant neoplasm of upper third of esophagus ([place]).   HISTORY OF PRESENT ILLNESS: Ronald Johnson is a 72 y.o. male seen at the request of Dr. 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 Final pathology revealed negative margins but LVSI and perinerual invasion. He has been followed in surveillance and was doing well until about a month ago when he developed persistent hoarseness following a viral URI. He was seen by ENT and found to have vocal cord paralysis without vocal cord lesion. He had a CT of the neck on 07/25/17 that revealed a 4-5 cm mass in the thoracic inlet on the right concerning for recurrent esophageal carcinoma. He also has enlargement of a right submandibular mass measuring 12 mm. CT brain on 08/06/17 as well as CT C/A/P was performed. No disease in the brain was noted. The mass seen on neck CT in the thoracic inlet measured 5.9 x 3.7 x 5.1 cm encasing and narrowing the right common carotid artery and abutting the right subclavian and right vertebral arteries. There was mass effect o the thyroid lobe on the right. Left reticulonodular and ground glass opacities were noted bilaterally favor infectious change and associated right sided adenopathy was noted. He is scheduled to undergo biopsy on 08/26/17 of his neck mass. We were asked to see him to discuss retreating his esophagus.    PREVIOUS RADIATION THERAPY: Yes   04/19/15 - 05/29/15: The patient was treated  to the upper abdominal region initially to a dose of 45 gray using a 6 field 3-D conformal technique. The patient then received a 5.4 gray boost using a 3 field technique. The patient's final dose was 50.4 gray which was given with concurrent chemotherapy.  PAST MEDICAL HISTORY:  Past Medical History:  Diagnosis Date  . Anemia 08/15/2009  . Anxiety   . Arthritis   . [name]'s esophagus 2007  . COLONIC POLYPS, ADENOMATOUS 08/01/2008, 2013, 2014  . COPD (chronic obstructive pulmonary disease) ([facility])    no per pt  . Depression   . Diabetes type 2, controlled ([place]) 03/13/2007  . Diverticulitis   . ED (erectile dysfunction)   . EMPHYSEMA 11/08/2008   no per pt  . Esophageal cancer ([place]) "dx'd ~ 01/2015"  . GERD 03/13/2007  . GIB (gastrointestinal bleeding) 08/18/2015  . Hiatal hernia   . History of blood transfusion 08/18/2015   due to GIB  . HYPERCHOLESTEROLEMIA 02/03/2008  . HYPERTENSION 03/13/2007   pt denies, claims white coat syndrome  . Prostatism   . PULMONARY NODULE 11/24/2008  . Stomach cancer ([place]) "dx'd ~ 01/2015"  . TOBACCO ABUSE 11/24/2008       PAST SURGICAL HISTORY: Past Surgical History:  Procedure Laterality Date  . BALLOON DILATION N/A 03/19/2016   Procedure: BALLOON DILATION;  Surgeon: Irene Shipper, MD;  Location: [facility];  Service: Endoscopy;  Laterality: N/A;  . CIRCUMCISION    . COLONOSCOPY    . COMPLETE ESOPHAGECTOMY N/A 07/25/2015   Procedure: TRANSHIATAL TOTAL ESOPHAGECTOMY COMPLETE  PYLOROMYOTOMY;  Surgeon: [name], MD;  Location: [facility];  Service: Thoracic;  Laterality: N/A;  . EGD  11/19/2005  . ESOPHAGOGASTRODUODENOSCOPY N/A 03/19/2016   Procedure: ESOPHAGOGASTRODUODENOSCOPY (EGD);  Surgeon: Irene Shipper, MD;  Location: [facility];  Service: Endoscopy;  Laterality: N/A;  . EUS N/A 03/16/2015   Procedure: UPPER ENDOSCOPIC ULTRASOUND (EUS) RADIAL;  Surgeon: Milus Banister, MD;  Location: [facility];  Service: Endoscopy;  Laterality: N/A;  . FOOT  FRACTURE SURGERY Right 1980   right foot w/ pins and screws  . JEJUNOSTOMY N/A 07/25/2015   Procedure: FEEDING JEJUNOSTOMY;  Surgeon: [name], MD;  Location: [facility] [facility];  Service: Thoracic;  Laterality: N/A;  . POLYPECTOMY    . removal pins and screws foot  1980   right foot  . UPPER GASTROINTESTINAL ENDOSCOPY    . VASECTOMY    . VIDEO BRONCHOSCOPY N/A 07/25/2015   Procedure: VIDEO BRONCHOSCOPY;  Surgeon: [name], MD;  Location: [facility];  Service: Thoracic;  Laterality: N/A;     FAMILY HISTORY:  Family History  Problem Relation Age of Onset  . Stroke Mother   . Diabetes Mother   . Diabetes Maternal Grandmother        mother side of the family aunts, MGF  . Breast cancer Paternal Grandmother   . AAA (abdominal aortic aneurysm) Father   . Colon cancer Neg Hx   . Esophageal cancer Neg Hx   . Rectal cancer Neg Hx   . Stomach cancer Neg Hx      SOCIAL HISTORY:  reports that he quit smoking about 8 years ago. His smoking use included cigarettes. He has a 35.00 pack-year smoking history. he has never used smokeless tobacco. He reports that he does not drink alcohol or use drugs.The patient is married and lives in [place].    ALLERGIES: Patient has no known allergies.   MEDICATIONS:  Current Outpatient Medications  Medication Sig Dispense Refill  . atorvastatin (LIPITOR) 10 MG tablet TAKE 1 TABLET BY MOUTH  EVERY DAY 90 tablet 3  . cetirizine (ZYRTEC) 10 MG tablet Take 10 mg by mouth daily.    . diazepam (VALIUM) 5 MG tablet Take 1 tablet (5 mg total) by mouth 2 (two) times daily as needed for anxiety. 180 tablet 1  . escitalopram (LEXAPRO) 10 MG tablet TAKE 1 TABLET BY MOUTH  DAILY 90 tablet 2  . glucose blood (PRECISION XTRA TEST STRIPS) test strip Check blood sugar three times a day dx 250.01 270 each 3  . ibuprofen (ADVIL,MOTRIN) 200 MG tablet Take 200 mg by mouth every 6 (six) hours as needed.    . insulin NPH-regular Human (HUMULIN 70/30) (70-30) 100 UNIT/ML  injection Inject 180 Units daily with breakfast into the skin. Inject 200 units into skin daily with breakfast. 6 vial 11  . Insulin Syringe-Needle U-100 30G X 3/8" 1 ML MISC Use to inject insulin 3 times daily. 300 each 1  . Multiple Vitamin (MULTIVITAMIN) tablet Take 1 tablet by mouth daily.    . RABEprazole (ACIPHEX) 20 MG tablet TAKE 1 TABLET(20 MG) BY MOUTH TWICE DAILY 60 tablet 3  . sildenafil (REVATIO) 20 MG tablet 1-5 tabs as needed for ED symptoms 100 tablet 11  . HYDROcodone-acetaminophen (HYCET) 7.5-325 mg/15 ml solution Take 5-10 mLs by mouth 4 (four) times daily as needed for moderate pain. 120 mL 0  . metoprolol tartrate (LOPRESSOR) 50 MG tablet Take 1 tablet 1 hour before procedure. (Patient not taking: Reported  on 08/21/2017) 1 tablet 0  . sucralfate (CARAFATE) 1 g tablet Take 1 tablet (1 g total) by mouth 4 (four) times daily -  with meals and at bedtime. Crush and mix in 1 oz of water to take 120 tablet 1  . traMADol (ULTRAM) 50 MG tablet Take 1 tablet (50 mg total) every 6 (six) hours as needed by mouth (headache). (Patient not taking: Reported on 08/21/2017) 30 tablet 0   Current Facility-Administered Medications  Medication Dose Route Frequency Provider Last Rate Last Dose  . 0.9 %  sodium chloride infusion  500 mL Intravenous Continuous Irene Shipper, MD         REVIEW OF SYSTEMS: On review of systems, the patient reports that he is doing ok. He has lost about 10 pounds in the last month. He reports it is difficult to tolerate much other than liquids. He denies any chest pain, shortness of breath, cough, fevers, chills, night sweats. He denies any bowel or bladder disturbances, and denies abdominal pain, nausea or vomiting. He denies any new musculoskeletal or joint aches or pains. A complete review of systems is obtained and is otherwise negative.     PHYSICAL EXAM:  Wt Readings from Last 3 Encounters:  08/21/17 233 lb (105.7 kg)  08/20/17 232 lb 9.6 oz (105.5 kg)  08/15/17  244 lb (110.7 kg)   Temp Readings from Last 3 Encounters:  08/21/17 98.1 F (36.7 C) (Oral)  08/20/17 (!) 96.9 F (36.1 C) (Oral)  08/08/17 97.7 F (36.5 C) (Oral)   BP Readings from Last 3 Encounters:  08/21/17 (!) 148/81  08/20/17 (!) 151/71  08/15/17 126/74   Pulse Readings from Last 3 Encounters:  08/21/17 95  08/20/17 92  08/15/17 (!) 113   Pain Assessment Pain Score: 4  Pain Loc: Throat/10  In general this is a well appearing Caucasian male in no acute distress. He is alert and oriented x4 and appropriate throughout the examination. HEENT reveals that the patient is normocephalic, atraumatic. EOMs are intact. PERRLA. Skin is intact without any evidence of gross lesions. Cardiovascular exam reveals a regular rate and rhythm, no clicks rubs or murmurs are auscultated. Chest is clear to auscultation bilaterally. Lymphatic assessment is performed and does not reveal any adenopathy in the cervical, supraclavicular, axillary, or inguinal chains. Abdomen has active bowel sounds in all quadrants and is intact. The abdomen is soft, non tender, non distended. Lower extremities are negative for pretibial pitting edema, deep calf tenderness, cyanosis or clubbing.   ECOG = 1  0 - Asymptomatic (Fully active, able to carry on all predisease activities without restriction)  1 - Symptomatic but completely ambulatory (Restricted in physically strenuous activity but ambulatory and able to carry out work of a light or sedentary nature. For example, light housework, office work)  2 - Symptomatic, <50% in bed during the day (Ambulatory and capable of all self care but unable to carry out any work activities. Up and about more than 50% of waking hours)  3 - Symptomatic, >50% in bed, but not bedbound (Capable of only limited self-care, confined to bed or chair 50% or more of waking hours)  4 - Bedbound (Completely disabled. Cannot carry on any self-care. Totally confined to bed or chair)  5 -  Death   Eustace Pen MM, Creech RH, Tormey DC, et al. [phone]). "Toxicity and response criteria of the [facility]". [place] Oncol. 5 (6): 649-55    LABORATORY DATA:  Lab Results  Component Value Date   WBC 9.6 08/05/2017   HGB 12.9 (L) 08/05/2017   HCT 39.2 08/05/2017   MCV 96.6 08/05/2017   PLT 160 08/05/2017   Lab Results  Component Value Date   NA 138 08/05/2017   K 4.0 08/05/2017   CL 102 10/28/2016   CO2 27 08/05/2017   Lab Results  Component Value Date   ALT 27 08/05/2017   AST 28 08/05/2017   ALKPHOS 95 08/05/2017   BILITOT 0.50 08/05/2017      RADIOGRAPHY: Ct Head W Wo Contrast  Result Date: 08/06/2017 CLINICAL DATA:  Esophageal cancer in 2016. Suspected recurrence on neck CT. Staging. EXAM: CT HEAD WITHOUT AND WITH CONTRAST TECHNIQUE: Contiguous axial images were obtained from the base of the skull through the vertex without and with intravenous contrast CONTRAST:  125 cc Isovue 300 intravenous COMPARISON:  None. FINDINGS: Brain: No evidence of acute infarction, hemorrhage, hydrocephalus, extra-axial collection or mass lesion/mass effect. Extensive low-density in the cerebral white matter consistent with chronic small vessel ischemia given patient's vascular risk factors. Vascular: Atherosclerotic calcification. Skull: No evidence of metastasis Sinuses/Orbits: Negative. IMPRESSION: 1. No evidence of metastasis. 2. Chronic small vessel ischemic in the cerebral white matter. Electronically Signed   By: Monte Fantasia M.D.   On: 08/06/2017 09:42   Ct Soft Tissue Neck W Contrast  Result Date: 07/25/2017 CLINICAL DATA:  Vocal cord paralysis over the last 6 weeks. Dysphagia and hoarseness. History of esophageal cancer treated with surgery, chemotherapy and radiation. Creatinine was obtained on site at [facility] at [address]Results: Creatinine 1.2 mg/dL. EXAM: CT NECK WITH CONTRAST TECHNIQUE: Multidetector CT imaging of the neck was  performed using the standard protocol following the bolus administration of intravenous contrast. CONTRAST:  23mL ISOVUE-300 IOPAMIDOL (ISOVUE-300) INJECTION 61% COMPARISON:  PET scan 04/12/2015 FINDINGS: Pharynx and larynx: No mucosal or submucosal lesion is seen. There is evidence of vocal fold paresis or paralysis on the right with asymmetric dilatation of the piriform sinus, sail sign and medial positioning of the cord. There is a 4-5 cm mass in the right lower neck upper mediastinum medially adjacent to the esophageal anastomosis which does not respect tissue planes in is likely to represent locally invasive recurrent carcinoma. Salivary glands: Parotid glands are normal. Left submandibular gland is normal. There is a 12 mm mass within the right parotid, enlarged since the previous PET study where it was about half that size. This is presumed represent a low-grade submandibular neoplasm. Thyroid: I think the thyroid gland is intrinsically normal, though the locally invasive mass comes very close to the inferior aspect of the right lobe. Lymph nodes: No distant lymph nodes in the neck. Vascular: Advanced atherosclerotic calcification at the carotid bifurcations. Scarring and narrowing of the right jugular vein, possibly locally involved by the mass at the right thoracic inlet region. Limited intracranial: Normal Visualized orbits: Not included Mastoids and visualized paranasal sinuses: Clear Skeleton: Ordinary spondylosis. Upper chest: No evidence of pneumonia. Emphysema at the apices. Question few small pulmonary nodules. Borderline enlarged mediastinal lymph nodes. Complete chest CT suggested. Other: Amorphus 1 x 2 cm calcification medially behind the right clavicle. This could be benign calcification perhaps related to an old hematoma. IMPRESSION: Indistinct, locally invasive 4-5 cm mass at the thoracic inlet region on the right likely to represent recurrent esophageal carcinoma. Leftward displacement of the  upper esophagus and pull through. Likely recurrent laryngeal nerve involvement on the right with right vocal fold paresis or paralysis. Slightly  prominent mediastinal lymph nodes and questionable small pulmonary nodules not well evaluated but of concern for the possibility of early metastatic disease in the chest. Full chest evaluation suggested. Enlargement of a right submandibular mass, now measuring 12 mm in diameter, most consistent with the low grade submandibular neoplasm given the change since the PET study of 2016. Electronically Signed   By: Nelson Chimes M.D.   On: 07/25/2017 16:29   Ct Chest W Contrast  Result Date: 08/06/2017 CLINICAL DATA:  Distal esophageal cancer with gastric pull-through. Cough and chest pain for 2 weeks. EXAM: CT CHEST, ABDOMEN, AND PELVIS WITH CONTRAST TECHNIQUE: Multidetector CT imaging of the chest, abdomen and pelvis was performed following the standard protocol during bolus administration of intravenous contrast. CONTRAST:  125 cc Isovue 300 COMPARISON:  Multiple exams, including neck CT of 07/25/2017 and abdomen CT of 08/08/2017 FINDINGS: CT CHEST FINDINGS Cardiovascular: Encasement of the right common carotid artery at the thoracic inlet by a 5.9 by 3.7 by 5.1 cm mass with ill-defined margins in knee right upper paratracheal and right paraesophageal space extending up into the supraclavicular region. Potential associated narrowing of the right common carotid artery along with mild mass effect on the right thyroid lobe. The mass tracks along the upper margin of the right subclavian artery and along the posteromedial margin of the right vertebral artery. Coronary, aortic arch, and branch vessel atherosclerotic vascular disease. Mediastinum/Nodes: As noted above there is a right upper mediastinal mass extending into the right supraclavicular region with mild leftward deviation of the trachea and esophagus. This mass appears to be in the vicinity of the anastomosis from the  gastric pull-through. Scattered small paratracheal and anterior mediastinal lymph nodes. A right hilar lymph node 1.4 cm in short axis, image 47/2, previously 0.7 cm. Subcarinal node 1.5 cm in short axis on image 56/2, formerly 1.0 cm. Right infrahilar nodal prominence up to 1 cm in short axis on image 61/2, new compared to the prior exam. Small bilateral axillary lymph nodes. Lungs/Pleura: Paraseptal emphysema. Scattered tree-in-bud reticulonodular opacities in the right upper lobe, right lower lobe, and right middle lobe with some areas of more confluent nodularity. Associated airway thickening noted. The appearance favors atypical infectious bronchiolitis, but with individual nodules up to about 1.4 cm in long axis, this clearly warrants surveillance. An index left upper lobe nodule posteriorly on image 46/6 measures 0.8 by 0.6 cm. A peripheral right lower lobe nodule on image 92/6 measures 1.5 by 0.8 cm. An index right lower lobe nodule on image 99/6 measures 0.9 by 0.7 cm and was formerly present and measured 0.6 by 0.7 cm. There are similar but less striking ground-glass and reticulonodular opacities in the left upper lobe and left lower lobe which warrants surveillance. Bilateral airway thickening. Musculoskeletal: Thoracic spondylosis. CT ABDOMEN PELVIS FINDINGS Hepatobiliary: Unremarkable Pancreas: Unremarkable Spleen: Unremarkable Adrenals/Urinary Tract: Mild thickening of both adrenal glands without discrete mass. Multiple bilateral hypodense renal lesions favoring cysts, some of the renal lesions are technically too small to characterize. Vascular calcifications along both renal hila. Stomach/Bowel: Postoperative findings from gastric pull-through. Scattered diverticula of the descending colon. Vascular/Lymphatic: Aortoiliac atherosclerotic vascular disease. Small retroperitoneal lymph nodes are not pathologically enlarged. Small bilateral external iliac lymph nodes. Reproductive: Unremarkable Other: No  supplemental non-categorized findings. Musculoskeletal: Mild lower lumbar degenerative disc disease and spondylosis. Mild levoconvex lumbar scoliosis with rotary component. IMPRESSION: 1. Ill-defined mass at the right thoracic inlet extending into the right superior mediastinum suspicious for recurrence, measuring 5.9 by 3.7 by  5.1 cm, encasing and narrowing the right common carotid artery, abutting the right subclavian and right vertebral arteries, and with some mass effect on the right thyroid lobe. 2. New bilateral but right much greater than left peripheral reticulonodular comment nodular, and ground-glass opacities. The overall pattern favors atypical infectious bronchiolitis, the given the associated nodularity, superimposed malignancy is difficult to exclude. There is associated adenopathy particularly on the right side which may be reactive or malignant. Although most of the associated nodularity is new, an index right lower lobe nodule has mildly enlarged and currently measures 0.9 by 0.7 cm. 3. Other imaging findings of potential clinical significance: Airway thickening is present, suggesting bronchitis or reactive airways disease. Aortoiliac atherosclerotic vascular disease. Bilateral hypodense renal lesions favoring cysts. Electronically Signed   By: Van Clines M.D.   On: 08/06/2017 10:02   Ct Abdomen Pelvis W Contrast  Result Date: 08/06/2017 CLINICAL DATA:  Distal esophageal cancer with gastric pull-through. Cough and chest pain for 2 weeks. EXAM: CT CHEST, ABDOMEN, AND PELVIS WITH CONTRAST TECHNIQUE: Multidetector CT imaging of the chest, abdomen and pelvis was performed following the standard protocol during bolus administration of intravenous contrast. CONTRAST:  125 cc Isovue 300 COMPARISON:  Multiple exams, including neck CT of 07/25/2017 and abdomen CT of 08/08/2017 FINDINGS: CT CHEST FINDINGS Cardiovascular: Encasement of the right common carotid artery at the thoracic inlet by a  5.9 by 3.7 by 5.1 cm mass with ill-defined margins in knee right upper paratracheal and right paraesophageal space extending up into the supraclavicular region. Potential associated narrowing of the right common carotid artery along with mild mass effect on the right thyroid lobe. The mass tracks along the upper margin of the right subclavian artery and along the posteromedial margin of the right vertebral artery. Coronary, aortic arch, and branch vessel atherosclerotic vascular disease. Mediastinum/Nodes: As noted above there is a right upper mediastinal mass extending into the right supraclavicular region with mild leftward deviation of the trachea and esophagus. This mass appears to be in the vicinity of the anastomosis from the gastric pull-through. Scattered small paratracheal and anterior mediastinal lymph nodes. A right hilar lymph node 1.4 cm in short axis, image 47/2, previously 0.7 cm. Subcarinal node 1.5 cm in short axis on image 56/2, formerly 1.0 cm. Right infrahilar nodal prominence up to 1 cm in short axis on image 61/2, new compared to the prior exam. Small bilateral axillary lymph nodes. Lungs/Pleura: Paraseptal emphysema. Scattered tree-in-bud reticulonodular opacities in the right upper lobe, right lower lobe, and right middle lobe with some areas of more confluent nodularity. Associated airway thickening noted. The appearance favors atypical infectious bronchiolitis, but with individual nodules up to about 1.4 cm in long axis, this clearly warrants surveillance. An index left upper lobe nodule posteriorly on image 46/6 measures 0.8 by 0.6 cm. A peripheral right lower lobe nodule on image 92/6 measures 1.5 by 0.8 cm. An index right lower lobe nodule on image 99/6 measures 0.9 by 0.7 cm and was formerly present and measured 0.6 by 0.7 cm. There are similar but less striking ground-glass and reticulonodular opacities in the left upper lobe and left lower lobe which warrants surveillance. Bilateral  airway thickening. Musculoskeletal: Thoracic spondylosis. CT ABDOMEN PELVIS FINDINGS Hepatobiliary: Unremarkable Pancreas: Unremarkable Spleen: Unremarkable Adrenals/Urinary Tract: Mild thickening of both adrenal glands without discrete mass. Multiple bilateral hypodense renal lesions favoring cysts, some of the renal lesions are technically too small to characterize. Vascular calcifications along both renal hila. Stomach/Bowel: Postoperative findings from gastric  pull-through. Scattered diverticula of the descending colon. Vascular/Lymphatic: Aortoiliac atherosclerotic vascular disease. Small retroperitoneal lymph nodes are not pathologically enlarged. Small bilateral external iliac lymph nodes. Reproductive: Unremarkable Other: No supplemental non-categorized findings. Musculoskeletal: Mild lower lumbar degenerative disc disease and spondylosis. Mild levoconvex lumbar scoliosis with rotary component. IMPRESSION: 1. Ill-defined mass at the right thoracic inlet extending into the right superior mediastinum suspicious for recurrence, measuring 5.9 by 3.7 by 5.1 cm, encasing and narrowing the right common carotid artery, abutting the right subclavian and right vertebral arteries, and with some mass effect on the right thyroid lobe. 2. New bilateral but right much greater than left peripheral reticulonodular comment nodular, and ground-glass opacities. The overall pattern favors atypical infectious bronchiolitis, the given the associated nodularity, superimposed malignancy is difficult to exclude. There is associated adenopathy particularly on the right side which may be reactive or malignant. Although most of the associated nodularity is new, an index right lower lobe nodule has mildly enlarged and currently measures 0.9 by 0.7 cm. 3. Other imaging findings of potential clinical significance: Airway thickening is present, suggesting bronchitis or reactive airways disease. Aortoiliac atherosclerotic vascular disease.  Bilateral hypodense renal lesions favoring cysts. Electronically Signed   By: Van Clines M.D.   On: 08/06/2017 10:02       IMPRESSION/PLAN: 1. 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 Dr. Lisbeth Renshaw discusses the recent imaging findings and Dr. Lisbeth Renshaw recommends proceeding with the scheduled biopsy on 08/26/17. Dr. Lisbeth Renshaw discusses that because of borders being indistinct, and his post surgical anatomy, he would not plan to treat until we had this data point. We discussed the risks, benefits, short, and long term effects of radiotherapy, and specifically reviewed the risks of TE fistula with re-treatment. The patient is interested in proceeding. Dr. Lisbeth Renshaw discusses the delivery and logistics of radiotherapy and anticipates a course of 6 weeks. We will follow up with his biopsy results and I will call him a few day after to confirm. We anticipate simulation on 09/02/17, and to begin treatment on 09/08/17. I have contacted Dr. [name] as well so his PAC placement can be cancelled.    The above documentation reflects my direct findings during this shared patient visit. Please see the separate note by Dr. Lisbeth Renshaw on this date for the remainder of the patient's plan of care.    Carola Rhine, PAC

## 2017-08-21 NOTE — Telephone Encounter (Signed)
Call from [facility] in IR, confirmed pt's port placement is to be canceled. Per Jonelle Sidle, wife is aware.

## 2017-08-21 NOTE — Telephone Encounter (Signed)
Left a message to call back if she wanted to discuss this.

## 2017-08-21 NOTE — Progress Notes (Signed)
Please see the Nurse Progress Note in the MD Initial Consult Encounter for this patient. 

## 2017-08-21 NOTE — Progress Notes (Signed)
[facility] Psychosocial Distress Screening Clinical Social Work  Clinical Social Work was referred by distress screening protocol.  The patient scored a 6 on the Psychosocial Distress Thermometer which indicates moderate distress. Clinical Social Worker Edwyna Shell to assess for distress and other psychosocial needs. CSW and patient discussed common feeling and emotions when being diagnosed with cancer, and the importance of support during treatment. CSW informed patient of the support team and support services at [facility]. CSW provided contact information and encouraged patient to call with any questions or concerns.  Patient awaiting scheduled biopsies and other procedures - "I dont feel so good right now, there's a lot going on."  Difficult for patient to talk on the phone, has transport to appointments, lives w wife and has support.  CSW will call patient next week as patient is unsure of his needs at this time - will know more after results of biopsies.    [facility] DISTRESS SCREENING 08/21/2017  Screening Type Initial Screening  Distress experienced in past week (1-10) 6  Emotional problem type Depression;Feeling hopeless  Spiritual/Religous concerns type Facing my mortality  Information Concerns Type Lack of info about maintaining fitness  Physical Problem type Pain;Sexual problems;Loss of appetitie  Physician notified of physical symptoms Yes  Referral to clinical social work Yes  Referral to dietition Yes    Clinical Social Worker follow up needed: Yes.    If yes, follow up plan:  Call next week to follow up re needs/concerns.    Edwyna Shell, LCSW Clinical Social Worker Phone:  [phone]

## 2017-08-22 ENCOUNTER — Other Ambulatory Visit (HOSPITAL_COMMUNITY): Payer: Self-pay

## 2017-08-22 ENCOUNTER — Ambulatory Visit (HOSPITAL_COMMUNITY): Payer: Medicare Other

## 2017-08-22 ENCOUNTER — Telehealth: Payer: Self-pay | Admitting: Student

## 2017-08-22 NOTE — Telephone Encounter (Signed)
Called patient to schedule Cardiac Ct - Per Mrs. Winningham - will not be able to do the Cardiac Ct at this time due to he is been follow up  with the [facility].  He has a lot of up coming tests in the future. Former patient of Dr. Stanford Breed.

## 2017-08-24 ENCOUNTER — Other Ambulatory Visit: Payer: Self-pay | Admitting: Radiology

## 2017-08-25 ENCOUNTER — Other Ambulatory Visit: Payer: Self-pay | Admitting: Radiology

## 2017-08-26 ENCOUNTER — Encounter (HOSPITAL_COMMUNITY): Payer: Self-pay

## 2017-08-26 ENCOUNTER — Ambulatory Visit (HOSPITAL_COMMUNITY)
Admission: RE | Admit: 2017-08-26 | Discharge: 2017-08-26 | Disposition: A | Discharge status: Home / Self Care | Payer: Medicare Other | Source: Ambulatory Visit | Attending: Nurse Practitioner | Admitting: Nurse Practitioner

## 2017-08-26 DIAGNOSIS — R590 Localized enlarged lymph nodes: Secondary | ICD-10-CM | POA: Diagnosis not present

## 2017-08-26 DIAGNOSIS — C159 Malignant neoplasm of esophagus, unspecified: Secondary | ICD-10-CM | POA: Insufficient documentation

## 2017-08-26 DIAGNOSIS — Z794 Long term (current) use of insulin: Secondary | ICD-10-CM | POA: Insufficient documentation

## 2017-08-26 DIAGNOSIS — C160 Malignant neoplasm of cardia: Secondary | ICD-10-CM | POA: Diagnosis not present

## 2017-08-26 DIAGNOSIS — Z79899 Other long term (current) drug therapy: Secondary | ICD-10-CM | POA: Diagnosis not present

## 2017-08-26 DIAGNOSIS — R599 Enlarged lymph nodes, unspecified: Secondary | ICD-10-CM | POA: Diagnosis not present

## 2017-08-26 LAB — CBC WITH DIFFERENTIAL/PLATELET
Basophils Absolute: 0 10*3/uL (ref 0.0–0.1)
Basophils Relative: 0 %
EOS ABS: 0.3 10*3/uL (ref 0.0–0.7)
Eosinophils Relative: 4 %
HCT: 39.2 % (ref 39.0–52.0)
Hemoglobin: 13.2 g/dL (ref 13.0–17.0)
Lymphocytes Relative: 51 %
Lymphs Abs: 4.3 10*3/uL — ABNORMAL HIGH (ref 0.7–4.0)
MCH: 32 pg (ref 26.0–34.0)
MCHC: 33.7 g/dL (ref 30.0–36.0)
MCV: 95.1 fL (ref 78.0–100.0)
MONO ABS: 0.4 10*3/uL (ref 0.1–1.0)
MONOS PCT: 5 %
Neutro Abs: 3.4 10*3/uL (ref 1.7–7.7)
Neutrophils Relative %: 40 %
PLATELETS: 158 10*3/uL (ref 150–400)
RBC: 4.12 MIL/uL — ABNORMAL LOW (ref 4.22–5.81)
RDW: 13.1 % (ref 11.5–15.5)
WBC: 8.5 10*3/uL (ref 4.0–10.5)

## 2017-08-26 LAB — PROTIME-INR
INR: 0.98
PROTHROMBIN TIME: 12.9 s (ref 11.4–15.2)

## 2017-08-26 LAB — GLUCOSE, CAPILLARY
GLUCOSE-CAPILLARY: 126 mg/dL — AB (ref 65–99)
Glucose-Capillary: 51 mg/dL — ABNORMAL LOW (ref 65–99)

## 2017-08-26 MED ORDER — MIDAZOLAM HCL 2 MG/2ML IJ SOLN
INTRAMUSCULAR | Status: AC
  Filled 2017-08-26: qty 4

## 2017-08-26 MED ORDER — MIDAZOLAM HCL 2 MG/2ML IJ SOLN
INTRAMUSCULAR | Status: AC | PRN
  Administered 2017-08-26 (×3): 1 mg via INTRAVENOUS

## 2017-08-26 MED ORDER — FENTANYL CITRATE (PF) 100 MCG/2ML IJ SOLN
INTRAMUSCULAR | Status: AC
  Filled 2017-08-26: qty 2

## 2017-08-26 MED ORDER — FENTANYL CITRATE (PF) 100 MCG/2ML IJ SOLN
INTRAMUSCULAR | Status: AC | PRN
  Administered 2017-08-26 (×2): 50 ug via INTRAVENOUS

## 2017-08-26 MED ORDER — LIDOCAINE HCL 1 % IJ SOLN
INTRAMUSCULAR | Status: AC
  Filled 2017-08-26: qty 10

## 2017-08-26 MED ORDER — DEXTROSE 50 % IV SOLN
1.0000 | Freq: Once | INTRAVENOUS | Status: AC
  Administered 2017-08-26: 50 mL via INTRAVENOUS
  Filled 2017-08-26: qty 50

## 2017-08-26 MED ORDER — SODIUM CHLORIDE 0.9 % IV SOLN: INTRAVENOUS | Status: DC

## 2017-08-26 MED ORDER — DEXTROSE-NACL 5-0.45 % IV SOLN
INTRAVENOUS | Status: DC
  Administered 2017-08-26: 12:00:00 via INTRAVENOUS

## 2017-08-26 NOTE — Discharge Instructions (Signed)
Moderate Conscious Sedation, Adult, Care After °These instructions provide you with information about caring for yourself after your procedure. Your health care provider may also give you more specific instructions. Your treatment has been planned according to current medical practices, but problems sometimes occur. Call your health care provider if you have any problems or questions after your procedure. °What can I expect after the procedure? °After your procedure, it is common: °· To feel sleepy for several hours. °· To feel clumsy and have poor balance for several hours. °· To have poor judgment for several hours. °· To vomit if you eat too soon. ° °Follow these instructions at home: °For at least 24 hours after the procedure: ° °· Do not: °? Participate in activities where you could fall or become injured. °? Drive. °? Use heavy machinery. °? Drink alcohol. °? Take sleeping pills or medicines that cause drowsiness. °? Make important decisions or sign legal documents. °? Take care of children on your own. °· Rest. °Eating and drinking °· Follow the diet recommended by your health care provider. °· If you vomit: °? Drink water, juice, or soup when you can drink without vomiting. °? Make sure you have little or no nausea before eating solid foods. °General instructions °· Have a responsible adult stay with you until you are awake and alert. °· Take over-the-counter and prescription medicines only as told by your health care provider. °· If you smoke, do not smoke without supervision. °· Keep all follow-up visits as told by your health care provider. This is important. °Contact a health care provider if: °· You keep feeling nauseous or you keep vomiting. °· You feel light-headed. °· You develop a rash. °· You have a fever. °Get help right away if: °· You have trouble breathing. °This information is not intended to replace advice given to you by your health care provider. Make sure you discuss any questions you have  with your health care provider. °Document Released: 05/26/2013 Document Revised: 01/08/2016 Document Reviewed: 11/25/2015 °Elsevier Interactive Patient Education © 2018 [employer] ° ° °Needle Biopsy, Care After °These instructions give you information about caring for yourself after your procedure. Your doctor may also give you more specific instructions. Call your doctor if you have any problems or questions after your procedure. °Follow these instructions at home: °· Rest as told by your doctor. °· Take medicines only as told by your doctor. °· There are many different ways to close and cover the biopsy site, including stitches (sutures), skin glue, and adhesive strips. Follow instructions from your doctor about: °? How to take care of your biopsy site. °? When and how you should change your bandage (dressing).  You may remove your dressing tomorrow.  You may shower tomorrow. °? When you should remove your dressing. °? Removing whatever was used to close your biopsy site. °· Check your biopsy site every day for signs of infection. Watch for: °? Redness, swelling, or pain. °? Fluid, blood, or pus. °Contact a doctor if: °· You have a fever. °· You have redness, swelling, or pain at the biopsy site, and it lasts longer than a few days. °· You have fluid, blood, or pus coming from the biopsy site. °· You feel sick to your stomach (nauseous). °· You throw up (vomit). °Get help right away if: °· You are short of breath. °· You have trouble breathing. °· Your chest hurts. °· You feel dizzy or you pass out (faint). °· You have bleeding that does   not stop with pressure or a bandage. °· You cough up blood. °· Your belly (abdomen) hurts. °This information is not intended to replace advice given to you by your health care provider. Make sure you discuss any questions you have with your health care provider. °Document Released: 07/18/2008 Document Revised: 01/11/2016 Document Reviewed: 08/01/2014 °[employer]vier Interactive  Patient Education © 2018 [employer] ° °

## 2017-08-26 NOTE — Procedures (Signed)
Right supraclavicular biopsy 18 g core EBL 0 Comp 0

## 2017-08-26 NOTE — Consult Note (Signed)
Chief Complaint: Patient was seen in consultation today for image guided biopsy of right neck mass/cervical lymph node  Referring Physician(s): Sherrill,B  Supervising Physician: Marybelle Killings  Patient Status: [facility] - Out-pt  History of Present Illness: Ronald Johnson is a 72 y.o. male with history of esophageal cancer diagnosed in 2016, status post surgery along with chemoradiation.  Recent follow-up imaging has revealed mass at thoracic inlet region on the right extending into the right superior mediastinum, encasing and narrowing the right common carotid artery with some mass-effect on the right thyroid lobe.  He presents today for image guided biopsy of a right neck mass/cervical lymph node for further evaluation.  Past Medical History:  Diagnosis Date  . Anemia 08/15/2009  . Anxiety   . Arthritis   . [name]'s esophagus 2007  . COLONIC POLYPS, ADENOMATOUS 08/01/2008, 2013, 2014  . COPD (chronic obstructive pulmonary disease) (HCC)    no per pt  . Depression   . Diabetes type 2, controlled ([place]) 03/13/2007  . Diverticulitis   . ED (erectile dysfunction)   . EMPHYSEMA 11/08/2008   no per pt  . Esophageal cancer ([place]) "dx'd ~ 01/2015"  . GERD 03/13/2007  . GIB (gastrointestinal bleeding) 08/18/2015  . Hiatal hernia   . History of blood transfusion 08/18/2015   due to GIB  . HYPERCHOLESTEROLEMIA 02/03/2008  . HYPERTENSION 03/13/2007   pt denies, claims white coat syndrome  . Prostatism   . PULMONARY NODULE 11/24/2008  . Stomach cancer ([place]) "dx'd ~ 01/2015"  . TOBACCO ABUSE 11/24/2008    Past Surgical History:  Procedure Laterality Date  . BALLOON DILATION N/A 03/19/2016   Procedure: BALLOON DILATION;  Surgeon: Irene Shipper, MD;  Location: [facility];  Service: Endoscopy;  Laterality: N/A;  . CIRCUMCISION    . COLONOSCOPY    . COMPLETE ESOPHAGECTOMY N/A 07/25/2015   Procedure: TRANSHIATAL TOTAL ESOPHAGECTOMY COMPLETE PYLOROMYOTOMY;  Surgeon: [name], MD;   Location: [facility];  Service: Thoracic;  Laterality: N/A;  . EGD  11/19/2005  . ESOPHAGOGASTRODUODENOSCOPY N/A 03/19/2016   Procedure: ESOPHAGOGASTRODUODENOSCOPY (EGD);  Surgeon: Irene Shipper, MD;  Location: [facility];  Service: Endoscopy;  Laterality: N/A;  . EUS N/A 03/16/2015   Procedure: UPPER ENDOSCOPIC ULTRASOUND (EUS) RADIAL;  Surgeon: Milus Banister, MD;  Location: [facility];  Service: Endoscopy;  Laterality: N/A;  . FOOT FRACTURE SURGERY Right 1980   right foot w/ pins and screws  . JEJUNOSTOMY N/A 07/25/2015   Procedure: FEEDING JEJUNOSTOMY;  Surgeon: [name], MD;  Location: [facility];  Service: Thoracic;  Laterality: N/A;  . POLYPECTOMY    . removal pins and screws foot  1980   right foot  . UPPER GASTROINTESTINAL ENDOSCOPY    . VASECTOMY    . VIDEO BRONCHOSCOPY N/A 07/25/2015   Procedure: VIDEO BRONCHOSCOPY;  Surgeon: [name], MD;  [facility];  Service: Thoracic;  Laterality: N/A;    Allergies: Patient has no known allergies.  Medications: Prior to Admission medications   Medication Sig Start Date End Date Taking? Authorizing Provider  atorvastatin (LIPITOR) 10 MG tablet TAKE 1 TABLET BY MOUTH  EVERY DAY 06/04/17   [name], MD  cetirizine (ZYRTEC) 10 MG tablet Take 10 mg by mouth daily.    [provider]  diazepam (VALIUM) 5 MG tablet Take 1 tablet (5 mg total) by mouth 2 (two) times daily as needed for anxiety. 03/03/17   [name], MD  escitalopram (LEXAPRO) 10 MG tablet TAKE  1 TABLET BY MOUTH  DAILY 03/27/17   [name], MD  glucose blood (PRECISION XTRA TEST STRIPS) test strip Check blood sugar three times a day dx 250.01 06/17/14   [name], MD  HYDROcodone-acetaminophen (HYCET) 7.5-325 mg/15 ml solution Take 5-10 mLs by mouth 4 (four) times daily as needed for moderate pain. 08/21/17   Hayden Pedro, PA-C  ibuprofen (ADVIL,MOTRIN) 200 MG tablet Take 200 mg by mouth every 6 (six) hours as needed.    [provider]  insulin NPH-regular Human (HUMULIN 70/30) (70-30) 100 UNIT/ML injection Inject 180 Units daily with breakfast into the skin. Inject 200 units into skin daily with breakfast. 06/30/17   [name], MD  Insulin Syringe-Needle U-100 30G X 3/8" 1 ML MISC Use to inject insulin 3 times daily. 06/06/15   [name], MD  metoprolol tartrate (LOPRESSOR) 50 MG tablet Take 1 tablet 1 hour before procedure. Patient not taking: Reported on 08/21/2017 07/16/17   Erma Heritage, PA-C  Multiple Vitamin (MULTIVITAMIN) tablet Take 1 tablet by mouth daily.    [provider]  RABEprazole (ACIPHEX) 20 MG tablet TAKE 1 TABLET(20 MG) BY MOUTH TWICE DAILY 05/12/17   Irene Shipper, MD  sildenafil (REVATIO) 20 MG tablet 1-5 tabs as needed for ED symptoms 03/03/17   [name], MD  sucralfate (CARAFATE) 1 g tablet Take 1 tablet (1 g total) by mouth 4 (four) times daily -  with meals and at bedtime. Crush and mix in 1 oz of water to take 08/21/17   Hayden Pedro, PA-C  traMADol (ULTRAM) 50 MG tablet Take 1 tablet (50 mg total) every 6 (six) hours as needed by mouth (headache). Patient not taking: Reported on 08/21/2017 06/30/17   [name], MD     Family History  Problem Relation Age of Onset  . Stroke Mother   . Diabetes Mother   . Diabetes Maternal Grandmother        mother side of the family aunts, MGF  . Breast cancer Paternal Grandmother   . AAA (abdominal aortic aneurysm) Father   . Colon cancer Neg Hx   . Esophageal cancer Neg Hx   . Rectal cancer Neg Hx   . Stomach cancer Neg Hx     Social History   Socioeconomic History  . Marital status: Married    Spouse name: Not on file  . Number of children: 2  . Years of education: Not on file  . Highest education level: Not on file  Social Needs  . Financial resource strain: Not on file  . Food insecurity - worry: Not on file  . Food insecurity - inability: Not on file  . Transportation needs - medical: Not on  file  . Transportation needs - non-medical: Not on file  Occupational History  . Occupation: retired    Fish farm manager: [employer]    Comment: works Youth worker  Tobacco Use  . Smoking status: Former Smoker    Packs/day: 1.00    Years: 35.00    Pack years: 35.00    Types: Cigarettes    Last attempt to quit: 10/31/2008    Years since quitting: 8.8  . Smokeless tobacco: Never Used  Substance and Sexual Activity  . Alcohol use: No    Alcohol/week: 0.0 oz  . Drug use: No  . Sexual activity: Not Currently    Partners: Female  Other Topics Concern  . Not on file  Social History Narrative   Married, wife  Chauncey with #2 grown children   Prior [employer], where he met his wife in [place]   Retired-prior work Personnel officer ADL's      Review of Systems denies fever,  chest pain, dyspnea, cough, abdominal/back pain, nausea, vomiting or bleeding.  He does have intermittent headaches and hoarseness /dysphagia.  Vital Signs: Blood pressure 160/89, heart rate 87, respirations 18, O2 sat 97% room air, temp 98.1    Physical Exam awake, alert.  Chest with distant breath sounds bilaterally.  Heart with regular rate and rhythm.  Abdomen soft, positive bowel sounds, nontender.  No lower extremity edema.  Right supraclavicular fossa mass.  Imaging: Ct Head W Wo Contrast  Result Date: 08/06/2017 CLINICAL DATA:  Esophageal cancer in 2016. Suspected recurrence on neck CT. Staging. EXAM: CT HEAD WITHOUT AND WITH CONTRAST TECHNIQUE: Contiguous axial images were obtained from the base of the skull through the vertex without and with intravenous contrast CONTRAST:  125 cc Isovue 300 intravenous COMPARISON:  None. FINDINGS: Brain: No evidence of acute infarction, hemorrhage, hydrocephalus, extra-axial collection or mass lesion/mass effect. Extensive low-density in the cerebral white matter consistent with chronic small vessel ischemia given patient's vascular risk factors.  Vascular: Atherosclerotic calcification. Skull: No evidence of metastasis Sinuses/Orbits: Negative. IMPRESSION: 1. No evidence of metastasis. 2. Chronic small vessel ischemic in the cerebral white matter. Electronically Signed   By: Monte Fantasia M.D.   On: 08/06/2017 09:42   Ct Chest W Contrast  Result Date: 08/06/2017 CLINICAL DATA:  Distal esophageal cancer with gastric pull-through. Cough and chest pain for 2 weeks. EXAM: CT CHEST, ABDOMEN, AND PELVIS WITH CONTRAST TECHNIQUE: Multidetector CT imaging of the chest, abdomen and pelvis was performed following the standard protocol during bolus administration of intravenous contrast. CONTRAST:  125 cc Isovue 300 COMPARISON:  Multiple exams, including neck CT of 07/25/2017 and abdomen CT of 08/08/2017 FINDINGS: CT CHEST FINDINGS Cardiovascular: Encasement of the right common carotid artery at the thoracic inlet by a 5.9 by 3.7 by 5.1 cm mass with ill-defined margins in knee right upper paratracheal and right paraesophageal space extending up into the supraclavicular region. Potential associated narrowing of the right common carotid artery along with mild mass effect on the right thyroid lobe. The mass tracks along the upper margin of the right subclavian artery and along the posteromedial margin of the right vertebral artery. Coronary, aortic arch, and branch vessel atherosclerotic vascular disease. Mediastinum/Nodes: As noted above there is a right upper mediastinal mass extending into the right supraclavicular region with mild leftward deviation of the trachea and esophagus. This mass appears to be in the vicinity of the anastomosis from the gastric pull-through. Scattered small paratracheal and anterior mediastinal lymph nodes. A right hilar lymph node 1.4 cm in short axis, image 47/2, previously 0.7 cm. Subcarinal node 1.5 cm in short axis on image 56/2, formerly 1.0 cm. Right infrahilar nodal prominence up to 1 cm in short axis on image 61/2, new compared  to the prior exam. Small bilateral axillary lymph nodes. Lungs/Pleura: Paraseptal emphysema. Scattered tree-in-bud reticulonodular opacities in the right upper lobe, right lower lobe, and right middle lobe with some areas of more confluent nodularity. Associated airway thickening noted. The appearance favors atypical infectious bronchiolitis, but with individual nodules up to about 1.4 cm in long axis, this clearly warrants surveillance. An index left upper lobe nodule posteriorly on image 46/6 measures 0.8 by 0.6 cm. A peripheral right lower lobe nodule on image 92/6 measures 1.5 by 0.8  cm. An index right lower lobe nodule on image 99/6 measures 0.9 by 0.7 cm and was formerly present and measured 0.6 by 0.7 cm. There are similar but less striking ground-glass and reticulonodular opacities in the left upper lobe and left lower lobe which warrants surveillance. Bilateral airway thickening. Musculoskeletal: Thoracic spondylosis. CT ABDOMEN PELVIS FINDINGS Hepatobiliary: Unremarkable Pancreas: Unremarkable Spleen: Unremarkable Adrenals/Urinary Tract: Mild thickening of both adrenal glands without discrete mass. Multiple bilateral hypodense renal lesions favoring cysts, some of the renal lesions are technically too small to characterize. Vascular calcifications along both renal hila. Stomach/Bowel: Postoperative findings from gastric pull-through. Scattered diverticula of the descending colon. Vascular/Lymphatic: Aortoiliac atherosclerotic vascular disease. Small retroperitoneal lymph nodes are not pathologically enlarged. Small bilateral external iliac lymph nodes. Reproductive: Unremarkable Other: No supplemental non-categorized findings. Musculoskeletal: Mild lower lumbar degenerative disc disease and spondylosis. Mild levoconvex lumbar scoliosis with rotary component. IMPRESSION: 1. Ill-defined mass at the right thoracic inlet extending into the right superior mediastinum suspicious for recurrence, measuring 5.9 by  3.7 by 5.1 cm, encasing and narrowing the right common carotid artery, abutting the right subclavian and right vertebral arteries, and with some mass effect on the right thyroid lobe. 2. New bilateral but right much greater than left peripheral reticulonodular comment nodular, and ground-glass opacities. The overall pattern favors atypical infectious bronchiolitis, the given the associated nodularity, superimposed malignancy is difficult to exclude. There is associated adenopathy particularly on the right side which may be reactive or malignant. Although most of the associated nodularity is new, an index right lower lobe nodule has mildly enlarged and currently measures 0.9 by 0.7 cm. 3. Other imaging findings of potential clinical significance: Airway thickening is present, suggesting bronchitis or reactive airways disease. Aortoiliac atherosclerotic vascular disease. Bilateral hypodense renal lesions favoring cysts. Electronically Signed   By: Van Clines M.D.   On: 08/06/2017 10:02   Ct Abdomen Pelvis W Contrast  Result Date: 08/06/2017 CLINICAL DATA:  Distal esophageal cancer with gastric pull-through. Cough and chest pain for 2 weeks. EXAM: CT CHEST, ABDOMEN, AND PELVIS WITH CONTRAST TECHNIQUE: Multidetector CT imaging of the chest, abdomen and pelvis was performed following the standard protocol during bolus administration of intravenous contrast. CONTRAST:  125 cc Isovue 300 COMPARISON:  Multiple exams, including neck CT of 07/25/2017 and abdomen CT of 08/08/2017 FINDINGS: CT CHEST FINDINGS Cardiovascular: Encasement of the right common carotid artery at the thoracic inlet by a 5.9 by 3.7 by 5.1 cm mass with ill-defined margins in knee right upper paratracheal and right paraesophageal space extending up into the supraclavicular region. Potential associated narrowing of the right common carotid artery along with mild mass effect on the right thyroid lobe. The mass tracks along the upper margin of  the right subclavian artery and along the posteromedial margin of the right vertebral artery. Coronary, aortic arch, and branch vessel atherosclerotic vascular disease. Mediastinum/Nodes: As noted above there is a right upper mediastinal mass extending into the right supraclavicular region with mild leftward deviation of the trachea and esophagus. This mass appears to be in the vicinity of the anastomosis from the gastric pull-through. Scattered small paratracheal and anterior mediastinal lymph nodes. A right hilar lymph node 1.4 cm in short axis, image 47/2, previously 0.7 cm. Subcarinal node 1.5 cm in short axis on image 56/2, formerly 1.0 cm. Right infrahilar nodal prominence up to 1 cm in short axis on image 61/2, new compared to the prior exam. Small bilateral axillary lymph nodes. Lungs/Pleura: Paraseptal emphysema. Scattered tree-in-bud reticulonodular opacities in  the right upper lobe, right lower lobe, and right middle lobe with some areas of more confluent nodularity. Associated airway thickening noted. The appearance favors atypical infectious bronchiolitis, but with individual nodules up to about 1.4 cm in long axis, this clearly warrants surveillance. An index left upper lobe nodule posteriorly on image 46/6 measures 0.8 by 0.6 cm. A peripheral right lower lobe nodule on image 92/6 measures 1.5 by 0.8 cm. An index right lower lobe nodule on image 99/6 measures 0.9 by 0.7 cm and was formerly present and measured 0.6 by 0.7 cm. There are similar but less striking ground-glass and reticulonodular opacities in the left upper lobe and left lower lobe which warrants surveillance. Bilateral airway thickening. Musculoskeletal: Thoracic spondylosis. CT ABDOMEN PELVIS FINDINGS Hepatobiliary: Unremarkable Pancreas: Unremarkable Spleen: Unremarkable Adrenals/Urinary Tract: Mild thickening of both adrenal glands without discrete mass. Multiple bilateral hypodense renal lesions favoring cysts, some of the renal  lesions are technically too small to characterize. Vascular calcifications along both renal hila. Stomach/Bowel: Postoperative findings from gastric pull-through. Scattered diverticula of the descending colon. Vascular/Lymphatic: Aortoiliac atherosclerotic vascular disease. Small retroperitoneal lymph nodes are not pathologically enlarged. Small bilateral external iliac lymph nodes. Reproductive: Unremarkable Other: No supplemental non-categorized findings. Musculoskeletal: Mild lower lumbar degenerative disc disease and spondylosis. Mild levoconvex lumbar scoliosis with rotary component. IMPRESSION: 1. Ill-defined mass at the right thoracic inlet extending into the right superior mediastinum suspicious for recurrence, measuring 5.9 by 3.7 by 5.1 cm, encasing and narrowing the right common carotid artery, abutting the right subclavian and right vertebral arteries, and with some mass effect on the right thyroid lobe. 2. New bilateral but right much greater than left peripheral reticulonodular comment nodular, and ground-glass opacities. The overall pattern favors atypical infectious bronchiolitis, the given the associated nodularity, superimposed malignancy is difficult to exclude. There is associated adenopathy particularly on the right side which may be reactive or malignant. Although most of the associated nodularity is new, an index right lower lobe nodule has mildly enlarged and currently measures 0.9 by 0.7 cm. 3. Other imaging findings of potential clinical significance: Airway thickening is present, suggesting bronchitis or reactive airways disease. Aortoiliac atherosclerotic vascular disease. Bilateral hypodense renal lesions favoring cysts. Electronically Signed   By: Van Clines M.D.   On: 08/06/2017 10:02    Labs:  CBC: Recent Labs    10/22/16 1136 05/06/17 0854 08/05/17 1547  WBC 6.2 6.5 9.6  HGB 12.4* 13.5 12.9*  HCT 36.7* 41.6 39.2  PLT 128* 167.0 160    COAGS: No results for  input(s): INR, APTT in the last 8760 hours.  BMP: Recent Labs    10/08/16 0825 10/28/16 0821 08/05/17 1547  NA 139 141 138  K 3.8 4.3 4.0  CL 101 102  --   CO2 32 32 27  GLUCOSE 177* 199* 184*  BUN 15 17 18.3  CALCIUM 9.6 9.4 9.5  CREATININE 1.13 1.11 1.3    LIVER FUNCTION TESTS: Recent Labs    10/28/16 0821 08/05/17 1547  BILITOT 0.5 0.50  AST 15 28  ALT 13 27  ALKPHOS 83 95  PROT 6.7 8.0  ALBUMIN 4.0 3.6    TUMOR MARKERS: No results for input(s): AFPTM, CEA, CA199, CHROMGRNA in the last 8760 hours.  Assessment and Plan: 72 y.o. male with history of esophageal cancer diagnosed in 2016, status post surgery along with chemoradiation.  Recent follow-up imaging has revealed mass at thoracic inlet region on the right extending into the right superior mediastinum, encasing and narrowing  the right common carotid artery with some mass-effect on the right thyroid lobe.  He presents today for image guided biopsy of a right neck mass/cervical lymph node for further evaluation.Risks and benefits discussed with the patient including, but not limited to bleeding, infection, damage to adjacent structures or low yield requiring additional tests. All of the patient's questions were answered, patient is agreeable to proceed. Consent signed and in chart.     Thank you for this interesting consult.  I greatly enjoyed meeting Ronald Johnson and look forward to participating in their care.  A copy of this report was sent to the requesting provider on this date.  Electronically Signed: D. Rowe Robert, PA-C 08/26/2017, 11:22 AM   I spent a total of  25 minutes   in face to face in clinical consultation, greater than 50% of which was counseling/coordinating care for image guided biopsy of right neck mass/cervical lymph node

## 2017-08-28 ENCOUNTER — Telehealth: Payer: Self-pay | Admitting: Endocrinology

## 2017-08-28 ENCOUNTER — Other Ambulatory Visit: Payer: Self-pay

## 2017-08-28 MED ORDER — ATORVASTATIN CALCIUM 10 MG PO TABS: 10.0000 mg | ORAL_TABLET | Freq: Every day | ORAL | 3 refills | Status: DC

## 2017-08-28 NOTE — Telephone Encounter (Signed)
Patient need refill of :  atorvastatin (LIPITOR) 10 MG tablet [[id number]]    Pharmacy:  [place], [place]

## 2017-08-28 NOTE — Telephone Encounter (Signed)
Done

## 2017-08-29 ENCOUNTER — Telehealth: Payer: Self-pay

## 2017-08-29 NOTE — Telephone Encounter (Signed)
Patient verbalized understanding of message below.  

## 2017-08-29 NOTE — Telephone Encounter (Signed)
-----   Message from Ladell Pier, MD sent at 08/29/2017  2:26 PM EST ----- Please let him know the biopsy showed no cancer cells, there was limited tissue We will consider treating without a biopsy or requesting another biopsy I will discuss with Dr. Lisbeth Renshaw when he returns next week ----- Message ----- From: Sherril Croon, RN Sent: 08/29/2017   9:13 AM To: Ladell Pier, MD  Patient is inquiring about the results of recent biopsy.

## 2017-09-01 NOTE — Telephone Encounter (Signed)
Patient calling back to discuss scheduling next Endo.

## 2017-09-02 ENCOUNTER — Telehealth: Payer: Self-pay | Admitting: Pharmacist

## 2017-09-02 ENCOUNTER — Ambulatory Visit
Admission: RE | Admit: 2017-09-02 | Discharge: 2017-09-02 | Disposition: A | Discharge status: Home / Self Care | Payer: Medicare Other | Source: Ambulatory Visit | Attending: Radiation Oncology | Admitting: Radiation Oncology

## 2017-09-02 DIAGNOSIS — Z510 Encounter for antineoplastic radiation therapy: Secondary | ICD-10-CM | POA: Diagnosis not present

## 2017-09-02 DIAGNOSIS — C770 Secondary and unspecified malignant neoplasm of lymph nodes of head, face and neck: Secondary | ICD-10-CM

## 2017-09-02 DIAGNOSIS — J449 Chronic obstructive pulmonary disease, unspecified: Secondary | ICD-10-CM | POA: Diagnosis not present

## 2017-09-02 DIAGNOSIS — F419 Anxiety disorder, unspecified: Secondary | ICD-10-CM | POA: Diagnosis not present

## 2017-09-02 DIAGNOSIS — C160 Malignant neoplasm of cardia: Secondary | ICD-10-CM

## 2017-09-02 DIAGNOSIS — D649 Anemia, unspecified: Secondary | ICD-10-CM | POA: Diagnosis not present

## 2017-09-02 DIAGNOSIS — C153 Malignant neoplasm of upper third of esophagus: Secondary | ICD-10-CM | POA: Diagnosis not present

## 2017-09-02 DIAGNOSIS — F418 Other specified anxiety disorders: Secondary | ICD-10-CM | POA: Diagnosis not present

## 2017-09-02 NOTE — Telephone Encounter (Signed)
Oral Oncology Pharmacist Encounter  Received new prescription for capecitabine for the treatment of GE junction adenocarcinoma, local recurrence. It will be in conjunction with radiation. Planned duration 6 weeks.  Counseled patient on administration, dosing, side effects, monitoring, drug-food interactions, safe handling, storage, and disposal.  Patient will take Capecitabine 2000mg  PO BID M-F on days of radiation only. It will be taken within 30 min of a meal. He says right now he is swallowing okay, but not eating quite as much as he knows he should. Encouraged him to take with as much food as possible to decrease stomach upset and he will let us know if he is having issues.   Start date: 09/22/17 with first day of radiation  Labs from 08/05/17 assessed, okay for treatment. SCr 1.3 noted, CrCL ~78 mL/min.  Side effects include but not limited to: diarrhea, nausea, rash on hands and feet, decreased blood counts, and increased sun sensitivity.    Reviewed with patient importance of keeping a medication schedule and plan for any missed doses.  Current medication list in Epic reviewed, no severe DDIs with capecitabine identified.  Prescription has been e-scribed to [facility]. Patient's copay is $185/fill, and he would like to have [place] help him apply for a grant. He requested we call him this coming Monday so he has time to gather in income information. He will pick up his script next week from the pharmacy once grant application is completed.  Thank you, Demetrius Charity, PharmD PGY2 Oncology Pharmacy Resident  Pharmacy Phone: [phone] 09/02/2017

## 2017-09-03 ENCOUNTER — Other Ambulatory Visit: Payer: Self-pay | Admitting: Radiation Oncology

## 2017-09-04 ENCOUNTER — Telehealth: Payer: Self-pay | Admitting: *Deleted

## 2017-09-04 NOTE — Telephone Encounter (Signed)
Called patient to inform of Pet Scan for 09-11-17 - arrival time - 2:30 pm @ [facility], pt. to be NPO-6 hrs. Prior to test, pt. to take insulin @ 9 am on 09-11-17, spoke with patient and he is aware of this test

## 2017-09-05 ENCOUNTER — Telehealth: Payer: Self-pay | Admitting: Nurse Practitioner

## 2017-09-05 ENCOUNTER — Encounter: Payer: Self-pay | Admitting: Nurse Practitioner

## 2017-09-05 ENCOUNTER — Inpatient Hospital Stay: Payer: Medicare Other | Attending: Nurse Practitioner | Admitting: Nurse Practitioner

## 2017-09-05 VITALS — BP 123/61 | HR 96 | Temp 97.6°F | Resp 18 | Ht 76.0 in | Wt 229.1 lb

## 2017-09-05 DIAGNOSIS — J449 Chronic obstructive pulmonary disease, unspecified: Secondary | ICD-10-CM

## 2017-09-05 DIAGNOSIS — I10 Essential (primary) hypertension: Secondary | ICD-10-CM | POA: Diagnosis not present

## 2017-09-05 DIAGNOSIS — Z86010 Personal history of colonic polyps: Secondary | ICD-10-CM

## 2017-09-05 DIAGNOSIS — Z923 Personal history of irradiation: Secondary | ICD-10-CM | POA: Diagnosis not present

## 2017-09-05 DIAGNOSIS — Z86711 Personal history of pulmonary embolism: Secondary | ICD-10-CM | POA: Diagnosis not present

## 2017-09-05 DIAGNOSIS — R634 Abnormal weight loss: Secondary | ICD-10-CM

## 2017-09-05 DIAGNOSIS — R590 Localized enlarged lymph nodes: Secondary | ICD-10-CM | POA: Diagnosis not present

## 2017-09-05 DIAGNOSIS — M542 Cervicalgia: Secondary | ICD-10-CM | POA: Insufficient documentation

## 2017-09-05 DIAGNOSIS — R110 Nausea: Secondary | ICD-10-CM

## 2017-09-05 DIAGNOSIS — E1151 Type 2 diabetes mellitus with diabetic peripheral angiopathy without gangrene: Secondary | ICD-10-CM

## 2017-09-05 DIAGNOSIS — Z9221 Personal history of antineoplastic chemotherapy: Secondary | ICD-10-CM | POA: Diagnosis not present

## 2017-09-05 DIAGNOSIS — R490 Dysphonia: Secondary | ICD-10-CM | POA: Diagnosis not present

## 2017-09-05 DIAGNOSIS — R1310 Dysphagia, unspecified: Secondary | ICD-10-CM | POA: Diagnosis not present

## 2017-09-05 DIAGNOSIS — E1121 Type 2 diabetes mellitus with diabetic nephropathy: Secondary | ICD-10-CM

## 2017-09-05 DIAGNOSIS — C160 Malignant neoplasm of cardia: Secondary | ICD-10-CM

## 2017-09-05 NOTE — Telephone Encounter (Signed)
Gave patient avs and calendar with appts per 11/8 los.  °

## 2017-09-05 NOTE — Telephone Encounter (Signed)
Oncology has sent a new referral for the patient to come discuss an EGD with dilation again. See the most recent note from Oncology 09/05/17.

## 2017-09-05 NOTE — Progress Notes (Addendum)
[place] OFFICE PROGRESS NOTE   Diagnosis: Esophagus cancer  INTERVAL HISTORY:   Ronald Johnson returns as scheduled.  He continues to have hoarseness and dysphagia.  He is tolerating liquids.  He is having some difficulty swallowing pills.  He reports weight loss.  Objective:  Vital signs in last 24 hours:  Blood pressure 123/61, pulse 96, temperature 97.6 F (36.4 C), temperature source Oral, resp. rate 18, height 6\' 4"  (1.93 m), weight 229 lb 1.6 oz (103.9 kg), SpO2 99 %.    HEENT: No thrush or ulcers. Lymphatics: Palpable mass right medial supraclavicular fossa. Resp: Distant breath sounds.  No respiratory distress. Cardio: Regular rate and rhythm. GI: Abdomen soft and nontender.  No hepatomegaly. Vascular: No leg edema.  Lab Results:  Lab Results  Component Value Date   WBC 8.5 08/26/2017   HGB 13.2 08/26/2017   HCT 39.2 08/26/2017   MCV 95.1 08/26/2017   PLT 158 08/26/2017   NEUTROABS 3.4 08/26/2017    Imaging:  No results found.  Medications: I have reviewed the patient's current medications.  Assessment/Plan: 1. 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 EUS 03/16/2015 confirmed a clinical stage IIb (uT3,uN1) tumor ? Staging CTs of the chest, abdomen, and pelvis with no evidence of metastatic disease ? Initiation of radiation 04/19/2015, cycle 1 Taxol/carboplatin 04/20/2015: Radiation completed 05/29/2015; the fifth and final week of Taxol/carboplatin 05/18/2015 ? Restaging CTs 07/20/2015 revealed no evidence of metastatic disease, incidental left lower lobe pulmonary embolism ? Transhiatal total esophagectomy 07/25/2015 confirmed a ypT3, ypN2 with 4/5 positive lymph nodes, lymphovascular invasion, perineural invasion, negative resection margins ? CT neck 07/25/2017-4-5 cm mass in the right lower neck upper mediastinum immediately adjacent to the esophageal anastomosis. 12 mm mass within the right parotid  enlarged since the previous PET study where it was about half that size. Question few small pulmonary nodules. Borderline enlarged mediastinal lymph nodes. ? CTs brain/chest/abdomen/pelvis 08/06/2017-ill-defined mass at the right thoracic inlet extending into the right superior mediastinum; new bilateral but right much greater than left peripheral reticulonodular and groundglass opacities; scattered small paratracheal and anterior mediastinal lymph nodes; small bilateral axillary lymph nodes. Brain CT with no evidence of metastasis. ? 08/26/2017 biopsy right supraclavicular lymph node-very scant fragment of benign lymph node.  2. Postprandial subxiphoid pain secondary to #1  3. Diabetes-elevated blood sugar readings at the [facility] and at home  4. COPD  5. Depression-Improved with Wellbutrin  6. Hypertension  7. History of adenomatous colon polyps  8. History of diabetic related neuropathy, peripheral arterial disease, and nephropathy  9. Nausea-likely related to radiation esophagitis/gastritis  10. Incidental pulmonary embolism noted on the CT 07/20/2015, treated with Xarelto  11. Admission 08/18/2015 with GI bleeding while on Xarelto, anticoagulation therapy discontinued  12. Dysphagia-improved with esophageal dilatation procedures by Dr. Henrene Pastor  13.History ofMild thrombocytopenia  14. CT scan abdomen/pelvis 10/09/2016 (done to evaluate persistent nausea, epigastric pain)-no acute findings or explanation for the patient's symptoms. Resolving postsurgical changes from presumed previous esophagectomy and gastric pull-through. No evidence of metastatic disease.      Disposition: Ronald Johnson appears unchanged.  The recent biopsy was nondiagnostic.  The plan is to proceed with a PET scan.  If the PET scan is positive the plan is for radiation/Xeloda.  If negative, he will undergo another biopsy.  We reviewed potential toxicities associated with  Xeloda including bone marrow toxicity, nausea, mouth sores, diarrhea, hand-foot syndrome, rash, increased sensitivity to sun.  He agrees to  proceed.  Unfortunately he has significant dysphagia and will likely have difficulty swallowing the Xeloda pills. We will contact Dr. Henrene Pastor to see if esophageal dilatation is an option.  If not the plan is for PICC line placement and infusional 5-FU during the course of radiation.  Ronald Johnson will return for a follow-up visit on 10/02/2017.  We will adjust his schedule accordingly pending evaluation by Dr. Henrene Pastor.  He will contact the office in the interim with any problems.  Patient seen with Dr. [name].  Ned Card ANP/GNP-BC   09/05/2017  11:02 AM  This was a shared visit with Ned Card.  The right neck lymph node biopsy was nondiagnostic.  His case was presented at the GI tumor conference earlier this week.  A diagnosis of recurrent esophagus cancer is very likely.  He has been scheduled for a PET scan.  If this is positive the plan is to proceed with 5-fluorouracil based chemotherapy and radiation.  Ronald Johnson has difficulty swallowing pills.  It will be difficult for him to take Xeloda.  We will plan for infusional 5-FU if he is unable to swallow multiple pills daily.  We will refer Ronald Johnson to Dr. Henrene Pastor to see if he could benefit from an esophageal dilatation as he has in the past.  I suspect the dysphagia at present may be tumor related and not amenable to a dilatation procedure.  He could also be a candidate for a diagnostic biopsy at the time of endoscopy.  Julieanne Manson, MD

## 2017-09-05 NOTE — Telephone Encounter (Signed)
What does Dr Stanford Breed Recommend? Or is this ok?

## 2017-09-05 NOTE — Telephone Encounter (Signed)
Would like cardiac cta when able Kirk Ruths

## 2017-09-05 NOTE — Telephone Encounter (Signed)
Left message for patient to call when he is ready to schedule.

## 2017-09-08 ENCOUNTER — Telehealth: Payer: Self-pay

## 2017-09-08 ENCOUNTER — Other Ambulatory Visit: Payer: Self-pay

## 2017-09-08 ENCOUNTER — Ambulatory Visit: Payer: Medicare Other | Admitting: Radiation Oncology

## 2017-09-08 DIAGNOSIS — R1310 Dysphagia, unspecified: Secondary | ICD-10-CM

## 2017-09-08 NOTE — Telephone Encounter (Signed)
Ok. Looks like mass bx was not diagnostic and suspicion now is for recurrent esophageal cancer so EGD is next step. Can you schedule this? Thanks

## 2017-09-08 NOTE — Telephone Encounter (Signed)
Message from Oncology was sent to Dr Henrene Pastor. The patient is scheduled.

## 2017-09-08 NOTE — Telephone Encounter (Signed)
-----   Message from Irene Shipper, MD sent at 09/08/2017  9:20 AM EST ----- That would be great. Thanks ----- Message ----- From: Algernon Huxley, RN Sent: 09/08/2017   8:31 AM To: Irene Shipper, MD  Do you want him to follow your 11:15am colon that is scheduled? You already have 4 pts, 5 procedures scheduled that day. Please let me know.  ----- Message ----- From: Irene Shipper, MD Sent: 09/06/2017  12:21 PM To: Algernon Huxley, RN  Vaughan Basta, See if you can get this guy on for EGD with MAC in am next Monday the 28th when I'm at [facility]. Thanks jnp ----- Message ----- From: Ladell Pier, MD Sent: 09/05/2017   6:02 PM To: Irene Shipper, MD  He has hoarseness and progressive dysphasia.  There is an upper chest mass that appears to be a local recurrence of esophagus cancer.  A needle biopsy of a right neck node was nondiagnostic.  The plan is to treat him with concurrent radiation and 5-fluorouracil.  He is unable to swallow chemotherapy pills at present.  We can treat him with infusional 5-FU if needed.  Can you schedule him for an upper endoscopy to see if he could benefit from another dilatation?  He could also benefit from a diagnostic biopsy if tumor is seen endoscopically.  He is scheduled for a PET scan next week.  We are hoping to begin treatment within the next 2 weeks.  Thanks,  [employer]

## 2017-09-08 NOTE — Telephone Encounter (Signed)
Pt scheduled for EGD with propofol at [facility] 09/15/17@12 :15pm. Pt to arrive at the hospital at 10:45am and be NPO after midnight. Pt to have transportation to and from the procedure. Pt aware. Referral in epic.

## 2017-09-09 ENCOUNTER — Ambulatory Visit: Payer: Medicare Other

## 2017-09-10 ENCOUNTER — Ambulatory Visit: Payer: Medicare Other | Admitting: Radiation Oncology

## 2017-09-10 ENCOUNTER — Ambulatory Visit: Payer: Medicare Other

## 2017-09-10 ENCOUNTER — Encounter (HOSPITAL_COMMUNITY): Payer: Self-pay | Admitting: Emergency Medicine

## 2017-09-10 ENCOUNTER — Other Ambulatory Visit: Payer: Self-pay

## 2017-09-11 ENCOUNTER — Ambulatory Visit: Payer: Medicare Other

## 2017-09-11 ENCOUNTER — Encounter (HOSPITAL_COMMUNITY)
Admission: RE | Admit: 2017-09-11 | Discharge: 2017-09-11 | Disposition: A | Discharge status: Home / Self Care | Payer: Medicare Other | Source: Ambulatory Visit | Attending: Radiation Oncology | Admitting: Radiation Oncology

## 2017-09-11 DIAGNOSIS — C153 Malignant neoplasm of upper third of esophagus: Secondary | ICD-10-CM | POA: Insufficient documentation

## 2017-09-11 DIAGNOSIS — C160 Malignant neoplasm of cardia: Secondary | ICD-10-CM | POA: Diagnosis not present

## 2017-09-11 LAB — GLUCOSE, CAPILLARY: Glucose-Capillary: 89 mg/dL (ref 65–99)

## 2017-09-11 MED ORDER — FLUDEOXYGLUCOSE F - 18 (FDG) INJECTION: 12.4000 | Freq: Once | INTRAVENOUS | Status: DC | PRN

## 2017-09-12 ENCOUNTER — Other Ambulatory Visit: Payer: Self-pay | Admitting: Pharmacist

## 2017-09-12 ENCOUNTER — Ambulatory Visit: Payer: Medicare Other

## 2017-09-12 MED ORDER — CAPECITABINE 500 MG PO TABS: 2000.0000 mg | ORAL_TABLET | Freq: Two times a day (BID) | ORAL | 0 refills | Status: DC

## 2017-09-15 ENCOUNTER — Encounter (HOSPITAL_COMMUNITY): Payer: Self-pay | Admitting: *Deleted

## 2017-09-15 ENCOUNTER — Encounter (HOSPITAL_COMMUNITY)
Admission: RE | Disposition: A | Discharge status: Home / Self Care | Payer: Self-pay | Source: Ambulatory Visit | Attending: Internal Medicine

## 2017-09-15 ENCOUNTER — Ambulatory Visit: Payer: Medicare Other

## 2017-09-15 ENCOUNTER — Ambulatory Visit (HOSPITAL_COMMUNITY): Payer: Medicare Other | Admitting: Anesthesiology

## 2017-09-15 ENCOUNTER — Ambulatory Visit (HOSPITAL_COMMUNITY)
Admission: RE | Admit: 2017-09-15 | Discharge: 2017-09-15 | Disposition: A | Discharge status: Home / Self Care | Payer: Medicare Other | Source: Ambulatory Visit | Attending: Internal Medicine | Admitting: Internal Medicine

## 2017-09-15 DIAGNOSIS — K449 Diaphragmatic hernia without obstruction or gangrene: Secondary | ICD-10-CM | POA: Insufficient documentation

## 2017-09-15 DIAGNOSIS — I739 Peripheral vascular disease, unspecified: Secondary | ICD-10-CM | POA: Insufficient documentation

## 2017-09-15 DIAGNOSIS — R51 Headache: Secondary | ICD-10-CM | POA: Insufficient documentation

## 2017-09-15 DIAGNOSIS — M1990 Unspecified osteoarthritis, unspecified site: Secondary | ICD-10-CM | POA: Insufficient documentation

## 2017-09-15 DIAGNOSIS — K219 Gastro-esophageal reflux disease without esophagitis: Secondary | ICD-10-CM | POA: Insufficient documentation

## 2017-09-15 DIAGNOSIS — Z87891 Personal history of nicotine dependence: Secondary | ICD-10-CM | POA: Diagnosis not present

## 2017-09-15 DIAGNOSIS — R1310 Dysphagia, unspecified: Secondary | ICD-10-CM

## 2017-09-15 DIAGNOSIS — F419 Anxiety disorder, unspecified: Secondary | ICD-10-CM | POA: Insufficient documentation

## 2017-09-15 DIAGNOSIS — J449 Chronic obstructive pulmonary disease, unspecified: Secondary | ICD-10-CM | POA: Diagnosis not present

## 2017-09-15 DIAGNOSIS — E7800 Pure hypercholesterolemia, unspecified: Secondary | ICD-10-CM | POA: Insufficient documentation

## 2017-09-15 DIAGNOSIS — Z8249 Family history of ischemic heart disease and other diseases of the circulatory system: Secondary | ICD-10-CM | POA: Diagnosis not present

## 2017-09-15 DIAGNOSIS — R911 Solitary pulmonary nodule: Secondary | ICD-10-CM | POA: Diagnosis not present

## 2017-09-15 DIAGNOSIS — Z823 Family history of stroke: Secondary | ICD-10-CM | POA: Insufficient documentation

## 2017-09-15 DIAGNOSIS — E119 Type 2 diabetes mellitus without complications: Secondary | ICD-10-CM | POA: Insufficient documentation

## 2017-09-15 DIAGNOSIS — Z803 Family history of malignant neoplasm of breast: Secondary | ICD-10-CM | POA: Insufficient documentation

## 2017-09-15 DIAGNOSIS — Z86010 Personal history of colonic polyps: Secondary | ICD-10-CM | POA: Diagnosis not present

## 2017-09-15 DIAGNOSIS — C153 Malignant neoplasm of upper third of esophagus: Secondary | ICD-10-CM | POA: Diagnosis not present

## 2017-09-15 DIAGNOSIS — Z85028 Personal history of other malignant neoplasm of stomach: Secondary | ICD-10-CM | POA: Insufficient documentation

## 2017-09-15 DIAGNOSIS — C159 Malignant neoplasm of esophagus, unspecified: Secondary | ICD-10-CM | POA: Diagnosis not present

## 2017-09-15 DIAGNOSIS — I10 Essential (primary) hypertension: Secondary | ICD-10-CM | POA: Diagnosis not present

## 2017-09-15 DIAGNOSIS — Z9221 Personal history of antineoplastic chemotherapy: Secondary | ICD-10-CM | POA: Insufficient documentation

## 2017-09-15 DIAGNOSIS — Z833 Family history of diabetes mellitus: Secondary | ICD-10-CM | POA: Insufficient documentation

## 2017-09-15 DIAGNOSIS — F329 Major depressive disorder, single episode, unspecified: Secondary | ICD-10-CM | POA: Insufficient documentation

## 2017-09-15 DIAGNOSIS — N400 Enlarged prostate without lower urinary tract symptoms: Secondary | ICD-10-CM | POA: Diagnosis not present

## 2017-09-15 DIAGNOSIS — Z923 Personal history of irradiation: Secondary | ICD-10-CM | POA: Insufficient documentation

## 2017-09-15 DIAGNOSIS — K222 Esophageal obstruction: Secondary | ICD-10-CM | POA: Diagnosis not present

## 2017-09-15 HISTORY — PX: ESOPHAGOGASTRODUODENOSCOPY (EGD) WITH PROPOFOL: SHX5813

## 2017-09-15 LAB — GLUCOSE, CAPILLARY: GLUCOSE-CAPILLARY: 184 mg/dL — AB (ref 65–99)

## 2017-09-15 SURGERY — ESOPHAGOGASTRODUODENOSCOPY (EGD) WITH PROPOFOL
Anesthesia: Monitor Anesthesia Care

## 2017-09-15 MED ORDER — PHENYLEPHRINE 40 MCG/ML (10ML) SYRINGE FOR IV PUSH (FOR BLOOD PRESSURE SUPPORT)
PREFILLED_SYRINGE | INTRAVENOUS | Status: DC | PRN
  Administered 2017-09-15: 80 ug via INTRAVENOUS

## 2017-09-15 MED ORDER — LIDOCAINE 2% (20 MG/ML) 5 ML SYRINGE
INTRAMUSCULAR | Status: DC | PRN
  Administered 2017-09-15: 100 mg via INTRAVENOUS

## 2017-09-15 MED ORDER — PROPOFOL 10 MG/ML IV BOLUS
INTRAVENOUS | Status: DC | PRN
  Administered 2017-09-15 (×2): 20 mg via INTRAVENOUS

## 2017-09-15 MED ORDER — PROPOFOL 500 MG/50ML IV EMUL
INTRAVENOUS | Status: DC | PRN
  Administered 2017-09-15: 140 ug/kg/min via INTRAVENOUS

## 2017-09-15 MED ORDER — SODIUM CHLORIDE 0.9 % IV SOLN: INTRAVENOUS | Status: DC

## 2017-09-15 MED ORDER — FENTANYL CITRATE (PF) 100 MCG/2ML IJ SOLN
INTRAMUSCULAR | Status: DC | PRN
  Administered 2017-09-15: 50 ug via INTRAVENOUS

## 2017-09-15 MED ORDER — FENTANYL CITRATE (PF) 100 MCG/2ML IJ SOLN
INTRAMUSCULAR | Status: AC
  Filled 2017-09-15: qty 2

## 2017-09-15 MED ORDER — PROPOFOL 10 MG/ML IV BOLUS
INTRAVENOUS | Status: AC
  Filled 2017-09-15: qty 40

## 2017-09-15 MED ORDER — LACTATED RINGERS IV SOLN
INTRAVENOUS | Status: DC
  Administered 2017-09-15: 1000 mL via INTRAVENOUS

## 2017-09-15 SURGICAL SUPPLY — 14 items

## 2017-09-15 NOTE — Anesthesia Postprocedure Evaluation (Signed)
Anesthesia Post Note  Patient: Ronald Johnson  Procedure(s) Performed: ESOPHAGOGASTRODUODENOSCOPY (EGD) WITH PROPOFOL (N/A )     Patient location during evaluation: PACU Anesthesia Type: MAC Level of consciousness: awake and alert Pain management: pain level controlled Vital Signs Assessment: post-procedure vital signs reviewed and stable Respiratory status: spontaneous breathing, nonlabored ventilation, respiratory function stable and patient connected to nasal cannula oxygen Cardiovascular status: stable and blood pressure returned to baseline Postop Assessment: no apparent nausea or vomiting Anesthetic complications: no    Last Vitals:  Vitals:   09/15/17 1321 09/15/17 1325  BP: 99/70   Pulse: (!) 106 (!) 103  Resp: (!) 21 (!) 22  Temp:    SpO2: 97% 98%    Last Pain:  Vitals:   09/15/17 1320  TempSrc: Oral  PainSc:                  [name]

## 2017-09-15 NOTE — H&P (Signed)
HISTORY OF PRESENT ILLNESS:  Ronald Johnson is a 72 y.o. male with a history of esophageal cancer status post chemoradiation therapy and resection. Has had prior endoscopy with dilation of the surgical anastomosis. Most recently June 2018 to maximal diameter of 18 mm. Since that time the patient has had progressive hoarseness and evidence of local recurrence of his tumor. Complaining of dysphagia. Dr. [name] asked [place] to see him today regarding the prospect so biopsy to confirm recurrent cancer, if present. As well, esophageal dilation if this is felt to be helpful based on endoscopic findings. He is accompanied today by his wife  REVIEW OF SYSTEMS:  All non-GI ROS negative unless otherwise stated in the history of present illness  Past Medical History:  Diagnosis Date  . Anemia 08/15/2009  . Anxiety   . Arthritis   . [name]'s esophagus 2007  . COLONIC POLYPS, ADENOMATOUS 08/01/2008, 2013, 2014  . COPD (chronic obstructive pulmonary disease) (HCC)    no per pt  . Depression   . Diabetes type 2, controlled ([place]) 03/13/2007  . Diverticulitis   . ED (erectile dysfunction)   . EMPHYSEMA 11/08/2008   no per pt  . Esophageal cancer ([place]) "dx'd ~ 01/2015"  . GERD 03/13/2007  . GIB (gastrointestinal bleeding) 08/18/2015  . Hiatal hernia   . History of blood transfusion 08/18/2015   due to GIB  . HYPERCHOLESTEROLEMIA 02/03/2008  . HYPERTENSION 03/13/2007   pt denies, claims white coat syndrome  . Prostatism   . PULMONARY NODULE 11/24/2008  . Stomach cancer ([place]) "dx'd ~ 01/2015"  . TOBACCO ABUSE 11/24/2008    Past Surgical History:  Procedure Laterality Date  . BALLOON DILATION N/A 03/19/2016   Procedure: BALLOON DILATION;  Surgeon: Irene Shipper, MD;  Location: [facility];  Service: Endoscopy;  Laterality: N/A;  . CIRCUMCISION    . COLONOSCOPY    . COMPLETE ESOPHAGECTOMY N/A 07/25/2015   Procedure: TRANSHIATAL TOTAL ESOPHAGECTOMY COMPLETE PYLOROMYOTOMY;  Surgeon: [name], MD;   Location: [place];  Service: Thoracic;  Laterality: N/A;  . EGD  11/19/2005  . ESOPHAGOGASTRODUODENOSCOPY N/A 03/19/2016   Procedure: ESOPHAGOGASTRODUODENOSCOPY (EGD);  Surgeon: Irene Shipper, MD;  Location: [facility];  Service: Endoscopy;  Laterality: N/A;  . EUS N/A 03/16/2015   Procedure: UPPER ENDOSCOPIC ULTRASOUND (EUS) RADIAL;  Surgeon: Milus Banister, MD;  Location: [facility];  Service: Endoscopy;  Laterality: N/A;  . FOOT FRACTURE SURGERY Right 1980   right foot w/ pins and screws  . JEJUNOSTOMY N/A 07/25/2015   Procedure: FEEDING JEJUNOSTOMY;  Surgeon: [name], MD;  Location: [facility];  Service: Thoracic;  Laterality: N/A;  . POLYPECTOMY    . removal pins and screws foot  1980   right foot  . UPPER GASTROINTESTINAL ENDOSCOPY    . VASECTOMY    . VIDEO BRONCHOSCOPY N/A 07/25/2015   Procedure: VIDEO BRONCHOSCOPY;  Surgeon: [name], MD;  Location: [facility] OR;  Service: Thoracic;  Laterality: N/A;    Social History [name]  reports that he quit smoking about 8 years ago. His smoking use included cigarettes. He has a 35.00 pack-year smoking history. he has never used smokeless tobacco. He reports that he does not drink alcohol or use drugs.  family history includes AAA (abdominal aortic aneurysm) in his father; Breast cancer in his paternal grandmother; Diabetes in his maternal grandmother and mother; Stroke in his mother.  No Known Allergies     PHYSICAL EXAMINATION:  Vital signs: BP Marland Kitchen)  157/89   Pulse (!) 102   Temp 98 F (36.7 C) (Oral)   Resp (!) 27   Ht 6\' 4"  (1.93 m)   Wt 233 lb (105.7 kg)   SpO2 93%   BMI 28.36 kg/m   Constitutional: generally well-appearing, no acute distress Psychiatric: alert and oriented x3, cooperative Eyes: extraocular movements intact, anicteric, conjunctiva pink Mouth: oral pharynx moist, no lesions Neck: supple no lymphadenopathy Cardiovascular: heart regular rate and rhythm, no murmur Lungs: clear to auscultation  bilaterally Abdomen: soft, nontender, nondistended, no obvious ascites, no peritoneal signs, normal bowel sounds, no organomegaly Rectal: Omitted Extremities: no moving, cyanosis, or lower extremity edema bilaterally Skin: no lesions on visible extremities Neuro: No focal deficits.   ASSESSMENT:  #1. Esophageal cancer status post chemoradiation therapy and resection. Appears to have local recurrence #2. Dysphagia. Anastomotic stricture versus cancer   PLAN:  #1. Upper endoscopy with possible biopsies and possible esophageal dilation. The patient is high-risk given his anatomy and comorbidities.The nature of the procedure, as well as the risks, benefits, and alternatives were carefully and thoroughly reviewed with the patient. Ample time for discussion and questions allowed. The patient understood, was satisfied, and agreed to proceed.

## 2017-09-15 NOTE — Anesthesia Preprocedure Evaluation (Addendum)
Anesthesia Evaluation  Patient identified by MRN, date of birth, ID band Patient awake    Reviewed: Allergy & Precautions, NPO status , Patient's Chart, lab work & pertinent test results  Airway Mallampati: II  TM Distance: >3 FB Neck ROM: Full    Dental  (+) Partial Lower, Partial Upper, Dental Advisory Given   Pulmonary COPD, former smoker,     + decreased breath sounds      Cardiovascular hypertension, Pt. on medications and Pt. on home beta blockers + Peripheral Vascular Disease   Rhythm:Regular Rate:Normal     Neuro/Psych  Headaches, PSYCHIATRIC DISORDERS Anxiety Depression  Neuromuscular disease    GI/Hepatic Neg liver ROS, hiatal hernia, GERD  ,  Endo/Other  diabetes, Type 2, Insulin Dependent  Renal/GU negative Renal ROS     Musculoskeletal  (+) Arthritis ,   Abdominal Normal abdominal exam  (+)   Peds  Hematology   Anesthesia Other Findings   Reproductive/Obstetrics                            Anesthesia Physical Anesthesia Plan  ASA: III  Anesthesia Plan: MAC   Post-op Pain Management:    Induction: Intravenous  PONV Risk Score and Plan: Propofol infusion  Airway Management Planned: Natural Airway and Nasal Cannula  Additional Equipment: None  Intra-op Plan:   Post-operative Plan:   Informed Consent: I have reviewed the patients History and Physical, chart, labs and discussed the procedure including the risks, benefits and alternatives for the proposed anesthesia with the patient or authorized representative who has indicated his/her understanding and acceptance.     Plan Discussed with: CRNA  Anesthesia Plan Comments:         Anesthesia Quick Evaluation

## 2017-09-15 NOTE — Discharge Instructions (Signed)
YOU HAD AN ENDOSCOPIC PROCEDURE TODAY: Refer to the procedure report and other information in the discharge instructions given to you for any specific questions about what was found during the examination. If this information does not answer your questions, please call [place] office at [phone] to clarify.  ° °YOU SHOULD EXPECT: Some feelings of bloating in the abdomen. Passage of more gas than usual. Walking can help get rid of the air that was put into your GI tract during the procedure and reduce the bloating. If you had a lower endoscopy (such as a colonoscopy or flexible sigmoidoscopy) you may notice spotting of blood in your stool or on the toilet paper. Some abdominal soreness may be present for a day or two, also. ° °DIET: Your first meal following the procedure should be a light meal and then it is ok to progress to your normal diet. A half-sandwich or bowl of soup is an example of a good first meal. Heavy or fried foods are harder to digest and may make you feel nauseous or bloated. Drink plenty of fluids but you should avoid alcoholic beverages for 24 hours. If you had a esophageal dilation, please see attached instructions for diet.   ° °ACTIVITY: Your care partner should take you home directly after the procedure. You should plan to take it easy, moving slowly for the rest of the day. You can resume normal activity the day after the procedure however YOU SHOULD NOT DRIVE, use power tools, machinery or perform tasks that involve climbing or major physical exertion for 24 hours (because of the sedation medicines used during the test).  ° °SYMPTOMS TO REPORT IMMEDIATELY: °A gastroenterologist can be reached at any hour. Please call [phone]  for any of the following symptoms:  °Following lower endoscopy (colonoscopy, flexible sigmoidoscopy) °Excessive amounts of blood in the stool  °Significant tenderness, worsening of abdominal pains  °Swelling of the abdomen that is new, acute  °Fever of 100° or  higher  °Following upper endoscopy (EGD, EUS, ERCP, esophageal dilation) °Vomiting of blood or coffee ground material  °New, significant abdominal pain  °New, significant chest pain or pain under the shoulder blades  °Painful or persistently difficult swallowing  °New shortness of breath  °Black, tarry-looking or red, bloody stools ° °FOLLOW UP:  °If any biopsies were taken you will be contacted by phone or by letter within the next 1-3 weeks. Call [phone]  if you have not heard about the biopsies in 3 weeks.  °Please also call with any specific questions about appointments or follow up tests. ° °

## 2017-09-15 NOTE — Transfer of Care (Signed)
Immediate Anesthesia Transfer of Care Note  Patient: Ronald Johnson  Procedure(s) Performed: ESOPHAGOGASTRODUODENOSCOPY (EGD) WITH PROPOFOL (N/A )  Patient Location: Endoscopy Unit  Anesthesia Type:MAC  Level of Consciousness: awake and alert   Airway & Oxygen Therapy: Patient Spontanous Breathing and Patient connected to nasal cannula oxygen  Post-op Assessment: Report given to RN and Post -op Vital signs reviewed and stable  Post vital signs: Reviewed and stable  Last Vitals:  Vitals:   09/15/17 1123 09/15/17 1128  BP:  (!) 157/89  Pulse: (!) 101 (!) 102  Resp:  (!) 27  Temp: 36.8 C 36.7 C  SpO2: 93% 93%    Last Pain:  Vitals:   09/15/17 1128  TempSrc: Oral  PainSc: 6          Complications: No apparent anesthesia complications

## 2017-09-15 NOTE — Op Note (Signed)
[facility] Patient Name: Ronald Johnson Procedure Date: 09/15/2017 MRN: [id number] Attending MD: Docia Chuck. Henrene Pastor , MD Date of Birth: 06-17-1946 CSN: [id number] Age: 72 Admit Type: Outpatient Procedure:                Upper GI endoscopy, with balloon dilation of the                            esophagus, with biopsies Indications:              Dysphagia Providers:                Docia Chuck. Henrene Pastor, MD, Elmer Ramp. Tilden Dome, RN, Cherylynn Ridges, Technician, Laurena Spies, Technician Referring MD:              Medicines:                Monitored Anesthesia Care Complications:            No immediate complications. Estimated Blood Loss:     Estimated blood loss: none. Procedure:                Pre-Anesthesia Assessment:                           - Prior to the procedure, a History and Physical                            was performed, and patient medications and                            allergies were reviewed. The patient's tolerance of                            previous anesthesia was also reviewed. The risks                            and benefits of the procedure and the sedation                            options and risks were discussed with the patient.                            All questions were answered, and informed consent                            was obtained. Prior Anticoagulants: The patient has                            taken no previous anticoagulant or antiplatelet                            agents. ASA Grade Assessment: III - A patient with  severe systemic disease. After reviewing the risks                            and benefits, the patient was deemed in                            satisfactory condition to undergo the procedure.                           After obtaining informed consent, the endoscope was                            passed under direct vision. Throughout the   procedure, the patient's blood pressure, pulse, and                            oxygen saturations were monitored continuously. The                            Endoscope was introduced through the mouth, and                            advanced to the upper third of esophagus. The upper                            GI endoscopy was accomplished without difficulty.                            The patient tolerated the procedure well. Scope In: Scope Out: Findings:      At 20 cm from the incisors was a tight stricture at the anastomosis       measuring only a few millimeters in diameter. The surrounding tissues       were firm, slightly friable, noncompliant. The stricture was dilated       with a 15 mm balloon several times. Though the diameter of the stricture       was improved but still would not permit the passage of a standard upper       endoscope. Friable areas were biopsied. Impression:               - High-grade anastomotic stricture as described                            above. Dilated. Biopsied. Moderate Sedation:      none Recommendation:           - Await pathology results.                           - May wish to consider surgical feeding tube                           Information discussed with patient, family, and                            oncology                           -  Resume previous diet.                           - Continue present medications. Procedure Code(s):        --- Professional ---                           [phone], Esophagoscopy, flexible, transoral; with                            transendoscopic balloon dilation (less than 30 mm                            diameter)                           43202, Esophagoscopy, flexible, transoral; with                            biopsy, single or multiple Diagnosis Code(s):        --- Professional ---                           K22.2, Esophageal obstruction                           R13.10, Dysphagia, unspecified CPT  copyright 2016 American Medical Association. All rights reserved. The codes documented in this report are preliminary and upon coder review may  be revised to meet current compliance requirements. Docia Chuck. Henrene Pastor, MD 09/15/2017 1:19:52 PM This report has been signed electronically. Number of Addenda: 0

## 2017-09-16 ENCOUNTER — Ambulatory Visit: Payer: Self-pay | Admitting: Nurse Practitioner

## 2017-09-16 ENCOUNTER — Telehealth: Payer: Self-pay | Admitting: Radiation Oncology

## 2017-09-16 ENCOUNTER — Ambulatory Visit: Payer: Medicare Other

## 2017-09-16 DIAGNOSIS — J449 Chronic obstructive pulmonary disease, unspecified: Secondary | ICD-10-CM | POA: Diagnosis not present

## 2017-09-16 DIAGNOSIS — Z510 Encounter for antineoplastic radiation therapy: Secondary | ICD-10-CM | POA: Diagnosis not present

## 2017-09-16 DIAGNOSIS — C770 Secondary and unspecified malignant neoplasm of lymph nodes of head, face and neck: Secondary | ICD-10-CM | POA: Diagnosis not present

## 2017-09-16 DIAGNOSIS — F419 Anxiety disorder, unspecified: Secondary | ICD-10-CM | POA: Diagnosis not present

## 2017-09-16 DIAGNOSIS — D649 Anemia, unspecified: Secondary | ICD-10-CM | POA: Diagnosis not present

## 2017-09-16 DIAGNOSIS — F418 Other specified anxiety disorders: Secondary | ICD-10-CM | POA: Diagnosis not present

## 2017-09-16 DIAGNOSIS — C153 Malignant neoplasm of upper third of esophagus: Secondary | ICD-10-CM | POA: Diagnosis not present

## 2017-09-16 NOTE — Telephone Encounter (Signed)
I called the patient and we will proceed with radiotherapy. He is scheduled to begin 09/22/17.

## 2017-09-17 ENCOUNTER — Ambulatory Visit: Payer: Medicare Other

## 2017-09-17 ENCOUNTER — Telehealth: Payer: Self-pay | Admitting: Nurse Practitioner

## 2017-09-17 ENCOUNTER — Encounter: Payer: Self-pay | Admitting: Nurse Practitioner

## 2017-09-17 ENCOUNTER — Inpatient Hospital Stay (HOSPITAL_BASED_OUTPATIENT_CLINIC_OR_DEPARTMENT_OTHER): Payer: Medicare Other | Admitting: Nurse Practitioner

## 2017-09-17 VITALS — BP 146/71 | HR 93 | Temp 98.2°F | Resp 17 | Ht 76.0 in | Wt 229.2 lb

## 2017-09-17 DIAGNOSIS — C160 Malignant neoplasm of cardia: Secondary | ICD-10-CM

## 2017-09-17 DIAGNOSIS — M542 Cervicalgia: Secondary | ICD-10-CM | POA: Diagnosis not present

## 2017-09-17 DIAGNOSIS — I10 Essential (primary) hypertension: Secondary | ICD-10-CM

## 2017-09-17 DIAGNOSIS — Z923 Personal history of irradiation: Secondary | ICD-10-CM

## 2017-09-17 DIAGNOSIS — R490 Dysphonia: Secondary | ICD-10-CM | POA: Diagnosis not present

## 2017-09-17 DIAGNOSIS — Z9221 Personal history of antineoplastic chemotherapy: Secondary | ICD-10-CM

## 2017-09-17 DIAGNOSIS — E1121 Type 2 diabetes mellitus with diabetic nephropathy: Secondary | ICD-10-CM

## 2017-09-17 DIAGNOSIS — Z86711 Personal history of pulmonary embolism: Secondary | ICD-10-CM

## 2017-09-17 DIAGNOSIS — R590 Localized enlarged lymph nodes: Secondary | ICD-10-CM | POA: Diagnosis not present

## 2017-09-17 DIAGNOSIS — J449 Chronic obstructive pulmonary disease, unspecified: Secondary | ICD-10-CM

## 2017-09-17 DIAGNOSIS — Z86010 Personal history of colonic polyps: Secondary | ICD-10-CM

## 2017-09-17 DIAGNOSIS — R110 Nausea: Secondary | ICD-10-CM

## 2017-09-17 DIAGNOSIS — E1151 Type 2 diabetes mellitus with diabetic peripheral angiopathy without gangrene: Secondary | ICD-10-CM

## 2017-09-17 DIAGNOSIS — R1310 Dysphagia, unspecified: Secondary | ICD-10-CM

## 2017-09-17 DIAGNOSIS — C159 Malignant neoplasm of esophagus, unspecified: Secondary | ICD-10-CM

## 2017-09-17 MED ORDER — HYDROCODONE-ACETAMINOPHEN 7.5-325 MG/15ML PO SOLN: 5.0000 mL | Freq: Four times a day (QID) | ORAL | 0 refills | Status: DC | PRN

## 2017-09-17 MED FILL — CAPECITABINE 500 MG TABLET: 500 | 28 days supply | Qty: 160 | Fill #0

## 2017-09-17 NOTE — Telephone Encounter (Signed)
Scheduled appt per 1/29 sch msg - spoke with patient regarding appts.

## 2017-09-17 NOTE — Telephone Encounter (Signed)
No 1/30 los.

## 2017-09-17 NOTE — Progress Notes (Addendum)
[facility] OFFICE PROGRESS NOTE   Diagnosis: Esophagus cancer  INTERVAL HISTORY:   Ronald Johnson returns as scheduled.  He continues to have dysphagia and hoarseness.  He is tolerating soft solids and liquids.  He periodically regurgitates.  He reports he is having minimal difficulty swallowing pills.  He continues to have right-sided neck pain.  Hydrocodone has been effective.  Bowels moving regularly.  Objective:  Vital signs in last 24 hours:  Blood pressure (!) 146/71, pulse 93, temperature 98.2 F (36.8 C), temperature source Oral, resp. rate 17, height 6\' 4"  (1.93 m), weight 229 lb 3.2 oz (104 kg), SpO2 97 %.    HEENT: No thrush or ulcers. Lymphatics: Palpable mass right medial supraclavicular fossa. Resp: Distant breath sounds.  No respiratory distress. Cardio: Regular rate and rhythm. GI: Abdomen soft and nontender.  No hepatomegaly. Vascular: No leg edema.   Lab Results:  Lab Results  Component Value Date   WBC 8.5 08/26/2017   HGB 13.2 08/26/2017   HCT 39.2 08/26/2017   MCV 95.1 08/26/2017   PLT 158 08/26/2017   NEUTROABS 3.4 08/26/2017    Imaging:  No results found.  Medications: I have reviewed the patient's current medications.  Assessment/Plan: 1. 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 EUS 03/16/2015 confirmed a clinical stage IIb (uT3,uN1) tumor ? Staging CTs of the chest, abdomen, and pelvis with no evidence of metastatic disease ? Initiation of radiation 04/19/2015, cycle 1 Taxol/carboplatin 04/20/2015: Radiation completed 05/29/2015; the fifth and final week of Taxol/carboplatin 05/18/2015 ? Restaging CTs 07/20/2015 revealed no evidence of metastatic disease, incidental left lower lobe pulmonary embolism ? Transhiatal total esophagectomy 07/25/2015 confirmed a ypT3, ypN2 with 4/5 positive lymph nodes, lymphovascular invasion, perineural invasion, negative resection margins ? CT neck  07/25/2017-4-5 cm mass in the right lower neck upper mediastinum immediately adjacent to the esophageal anastomosis. 12 mm mass within the right parotid enlarged since the previous PET study where it was about half that size. Question few small pulmonary nodules. Borderline enlarged mediastinal lymph nodes. ? CTs brain/chest/abdomen/pelvis 08/06/2017-ill-defined mass at the right thoracic inlet extending into the right superior mediastinum; new bilateral but right much greater than left peripheral reticulonodular and groundglass opacities; scattered small paratracheal and anterior mediastinal lymph nodes; small bilateral axillary lymph nodes. Brain CT with no evidence of metastasis. ? 08/26/2017 biopsy right supraclavicular lymph node-very scant fragment of benign lymph node. ? PET scan 09/11/2017-poorly marginated heterogeneously hypermetabolic soft tissue mass at the right thoracic inlet extending laterally from the esophagogastric anastomosis in the neck.  No definite hypermetabolic metastatic disease.  Patchy tree-in-bud opacities throughout the lungs significantly decreased.  Persistent scattered subcentimeter solid pulmonary nodules in both lungs stable to decreased.  Stable irregular 1.7 cm nodular bandlike opacity anterior left lower lobe with associated low-level metabolism.  Nonspecific mildly hypermetabolic subcarinal and bilateral hilar lymph nodes.  Nonspecific mildly hypermetabolic nodular focus of soft tissue in the upper left retroperitoneum anterior to the adrenal gland. ? Upper endoscopy 09/15/2017-at 20 cm from the incisors tight stricture at the anastomosis measuring only a few millimeters in diameter.  Surrounding tissues firm, slightly friable, noncompliant.  Stricture was dilated with a 15 mm balloon several times.  Diameter of stricture improved but still would not permit passage of a standard upper endoscope.  Friable areas biopsied-invasive adenocarcinoma.  2. Postprandial  subxiphoid pain secondary to #1  3. Diabetes-elevated blood sugar readings at the [facility] and at home  4. COPD  5. Depression-Improved  with Wellbutrin  6. Hypertension  7. History of adenomatous colon polyps  8. History of diabetic related neuropathy, peripheral arterial disease, and nephropathy  9. Nausea-likely related to radiation esophagitis/gastritis  10. Incidental pulmonary embolism noted on the CT 07/20/2015, treated with Xarelto  11. Admission 08/18/2015 with GI bleeding while on Xarelto, anticoagulation therapy discontinued  12. Dysphagia-improved with esophageal dilatation procedures by Dr. Henrene Pastor  13.History ofMild thrombocytopenia  14. CT scan abdomen/pelvis 10/09/2016 (done to evaluate persistent nausea, epigastric pain)-no acute findings or explanation for the patient's symptoms. Resolving postsurgical changes from presumed previous esophagectomy and gastric pull-through. No evidence of metastatic disease.   Disposition: Mr. Limas appears unchanged.  The recent PET scan did not show obvious metastatic disease.  He understands the biopsy done at the time of the upper endoscopy did confirm adenocarcinoma.  Dr. [name] recommends proceeding with radiation/5-fluorouracil.  Mr. Nicholl would like to try Xeloda initially.  He will contact the office if he is unable to swallow the pills and we will refer him for placement of a PICC line and initiation of infusional 5-fluorouracil.  We again reviewed potential toxicities associated with Xeloda including bone marrow toxicity, nausea, mouth sores, diarrhea, hand-foot syndrome, skin rash, increased sensitivity to sun, conjunctivitis.  He agrees to proceed.  He is scheduled to begin radiation 09/22/2017.  We will see him in follow-up on 10/02/2017.  He will contact the office in the interim as outlined above or with any other problems.  He was provided with a new hydrocodone prescription at today's  visit.  Patient seen with Dr. [name].  25 minutes were spent face-to-face at today's visit with the majority of that time involved in counseling/coordination of care.    Ned Card ANP/GNP-BC   09/17/2017  10:35 AM  This was a shared visit with Ned Card.  Mr. Fortunato has been diagnosed with recurrent adenocarcinoma of the esophagus.  The upper endoscopy confirmed a malignant stricture that was not amenable to dilatation.  He continues to have significant dysphasia, but he would like to try treatment with capecitabine as opposed to infusional 5-FU.  The plan is to initiate capecitabine and concurrent radiation to 11/2017.  There is no clear evidence of distant metastatic disease, though there may be an upper retroperitoneal lymph node or soft tissue implant.  Julieanne Manson, MD

## 2017-09-18 ENCOUNTER — Ambulatory Visit: Payer: Medicare Other

## 2017-09-19 ENCOUNTER — Ambulatory Visit: Payer: Medicare Other

## 2017-09-22 ENCOUNTER — Ambulatory Visit: Payer: Medicare Other

## 2017-09-22 ENCOUNTER — Ambulatory Visit
Admission: RE | Admit: 2017-09-22 | Discharge: 2017-09-22 | Disposition: A | Discharge status: Home / Self Care | Payer: Medicare Other | Source: Ambulatory Visit | Attending: Radiation Oncology | Admitting: Radiation Oncology

## 2017-09-22 VITALS — BP 135/95 | HR 101 | Temp 97.7°F | Resp 18

## 2017-09-22 DIAGNOSIS — C770 Secondary and unspecified malignant neoplasm of lymph nodes of head, face and neck: Secondary | ICD-10-CM | POA: Diagnosis not present

## 2017-09-22 DIAGNOSIS — F418 Other specified anxiety disorders: Secondary | ICD-10-CM

## 2017-09-22 DIAGNOSIS — Z510 Encounter for antineoplastic radiation therapy: Secondary | ICD-10-CM | POA: Diagnosis not present

## 2017-09-22 DIAGNOSIS — F419 Anxiety disorder, unspecified: Secondary | ICD-10-CM | POA: Diagnosis not present

## 2017-09-22 DIAGNOSIS — D649 Anemia, unspecified: Secondary | ICD-10-CM | POA: Diagnosis not present

## 2017-09-22 DIAGNOSIS — C153 Malignant neoplasm of upper third of esophagus: Secondary | ICD-10-CM | POA: Diagnosis not present

## 2017-09-22 DIAGNOSIS — J449 Chronic obstructive pulmonary disease, unspecified: Secondary | ICD-10-CM | POA: Diagnosis not present

## 2017-09-22 DIAGNOSIS — C160 Malignant neoplasm of cardia: Secondary | ICD-10-CM

## 2017-09-22 MED ORDER — LORAZEPAM 0.5 MG PO TABS: ORAL_TABLET | ORAL | 0 refills | Status: DC

## 2017-09-22 MED ORDER — MORPHINE SULFATE 4 MG/ML IJ SOLN
2.0000 mg | Freq: Once | INTRAMUSCULAR | Status: AC
  Administered 2017-09-22: 2 mg via INTRAMUSCULAR
  Filled 2017-09-22: qty 1

## 2017-09-22 MED ORDER — OXYCODONE HCL 5 MG PO TABS: 5.0000 mg | ORAL_TABLET | ORAL | 0 refills | Status: DC | PRN

## 2017-09-22 MED ORDER — LORAZEPAM 0.5 MG PO TABS
0.5000 mg | ORAL_TABLET | Freq: Once | ORAL | Status: AC
  Administered 2017-09-22: 0.5 mg via ORAL
  Filled 2017-09-22: qty 1

## 2017-09-22 NOTE — Addendum Note (Signed)
Encounter addended by: Loma Messing, LPN on: 08/20/4578 99:83 AM  Actions taken: Sign clinical note

## 2017-09-22 NOTE — Progress Notes (Signed)
Pt states that he has a sharp pain on the right side of his neck that is an 8 out of 10 on the pain scale. Pt states that the mask was pressing on the right side of his neck where the biopsy was done.  Pt has not started his first treatment and wanted to see the doctor because of the pain he was in. Pt was seen by Shona Simpson PA. A verbal order was given for 2mg  of Morphine IM and 0.5mg  of Ativan sublingual.  Morphine 2 mg IM was administered in his right Deltoid and 0.5 mg of Ativan was given sublingual. Pt tolerated both medications and then was escorted to [facility] for treatment.

## 2017-09-22 NOTE — Addendum Note (Signed)
Encounter addended by: Loma Messing, LPN on: 02/17/5952 96:72 AM  Actions taken: Vitals modified

## 2017-09-22 NOTE — Progress Notes (Signed)
I spoke with the patient today because he was unable to tolerate the placement of his mask for his radiotherapy.  He is to start radiation and concurrent chemo today.  He was interested in trying this again with the assistance of pain medication.  He will received 2 mg of morphine IM in the clinic as well as 0.5 mg of Ativan.  Of new prescription for oxycodone and Ativan will be sent into his pharmacy.  He is in agreement with this plan, and we will try re-zooming intentions for treatment today in about 20 minutes.    Carola Rhine, PAC

## 2017-09-23 ENCOUNTER — Ambulatory Visit: Payer: Medicare Other

## 2017-09-23 ENCOUNTER — Telehealth: Payer: Self-pay | Admitting: Oncology

## 2017-09-23 NOTE — Telephone Encounter (Signed)
09/23/17 @ 7:46 am called and left vm for Ronald Johnson @ [phone] stating her FMLA would be at front desk reception area ready for her to pick up as she requested.

## 2017-09-24 ENCOUNTER — Ambulatory Visit: Payer: Medicare Other

## 2017-09-24 ENCOUNTER — Ambulatory Visit
Admission: RE | Admit: 2017-09-24 | Discharge: 2017-09-24 | Disposition: A | Discharge status: Home / Self Care | Payer: Medicare Other | Source: Ambulatory Visit | Attending: Radiation Oncology | Admitting: Radiation Oncology

## 2017-09-24 ENCOUNTER — Ambulatory Visit: Payer: Medicare Other | Admitting: Radiation Oncology

## 2017-09-24 DIAGNOSIS — F418 Other specified anxiety disorders: Secondary | ICD-10-CM | POA: Diagnosis not present

## 2017-09-24 DIAGNOSIS — C770 Secondary and unspecified malignant neoplasm of lymph nodes of head, face and neck: Secondary | ICD-10-CM | POA: Diagnosis not present

## 2017-09-24 DIAGNOSIS — J449 Chronic obstructive pulmonary disease, unspecified: Secondary | ICD-10-CM | POA: Diagnosis not present

## 2017-09-24 DIAGNOSIS — C153 Malignant neoplasm of upper third of esophagus: Secondary | ICD-10-CM | POA: Diagnosis not present

## 2017-09-24 DIAGNOSIS — F419 Anxiety disorder, unspecified: Secondary | ICD-10-CM | POA: Diagnosis not present

## 2017-09-24 DIAGNOSIS — Z510 Encounter for antineoplastic radiation therapy: Secondary | ICD-10-CM | POA: Diagnosis not present

## 2017-09-24 DIAGNOSIS — D649 Anemia, unspecified: Secondary | ICD-10-CM | POA: Diagnosis not present

## 2017-09-25 ENCOUNTER — Ambulatory Visit: Payer: Medicare Other

## 2017-09-25 ENCOUNTER — Ambulatory Visit
Admission: RE | Admit: 2017-09-25 | Discharge: 2017-09-25 | Disposition: A | Discharge status: Home / Self Care | Payer: Medicare Other | Source: Ambulatory Visit | Attending: Radiation Oncology | Admitting: Radiation Oncology

## 2017-09-25 DIAGNOSIS — J449 Chronic obstructive pulmonary disease, unspecified: Secondary | ICD-10-CM | POA: Diagnosis not present

## 2017-09-25 DIAGNOSIS — D649 Anemia, unspecified: Secondary | ICD-10-CM | POA: Diagnosis not present

## 2017-09-25 DIAGNOSIS — C153 Malignant neoplasm of upper third of esophagus: Secondary | ICD-10-CM | POA: Diagnosis not present

## 2017-09-25 DIAGNOSIS — F418 Other specified anxiety disorders: Secondary | ICD-10-CM | POA: Diagnosis not present

## 2017-09-25 DIAGNOSIS — F419 Anxiety disorder, unspecified: Secondary | ICD-10-CM | POA: Diagnosis not present

## 2017-09-25 DIAGNOSIS — C770 Secondary and unspecified malignant neoplasm of lymph nodes of head, face and neck: Secondary | ICD-10-CM | POA: Diagnosis not present

## 2017-09-25 DIAGNOSIS — Z510 Encounter for antineoplastic radiation therapy: Secondary | ICD-10-CM | POA: Diagnosis not present

## 2017-09-26 ENCOUNTER — Ambulatory Visit: Payer: Medicare Other

## 2017-09-26 ENCOUNTER — Ambulatory Visit
Admission: RE | Admit: 2017-09-26 | Discharge: 2017-09-26 | Disposition: A | Discharge status: Home / Self Care | Payer: Medicare Other | Source: Ambulatory Visit | Attending: Radiation Oncology | Admitting: Radiation Oncology

## 2017-09-26 DIAGNOSIS — C770 Secondary and unspecified malignant neoplasm of lymph nodes of head, face and neck: Secondary | ICD-10-CM | POA: Diagnosis not present

## 2017-09-26 DIAGNOSIS — D649 Anemia, unspecified: Secondary | ICD-10-CM | POA: Diagnosis not present

## 2017-09-26 DIAGNOSIS — F418 Other specified anxiety disorders: Secondary | ICD-10-CM | POA: Diagnosis not present

## 2017-09-26 DIAGNOSIS — J449 Chronic obstructive pulmonary disease, unspecified: Secondary | ICD-10-CM | POA: Diagnosis not present

## 2017-09-26 DIAGNOSIS — C153 Malignant neoplasm of upper third of esophagus: Secondary | ICD-10-CM | POA: Diagnosis not present

## 2017-09-26 DIAGNOSIS — Z510 Encounter for antineoplastic radiation therapy: Secondary | ICD-10-CM | POA: Diagnosis not present

## 2017-09-26 DIAGNOSIS — F419 Anxiety disorder, unspecified: Secondary | ICD-10-CM | POA: Diagnosis not present

## 2017-09-29 ENCOUNTER — Ambulatory Visit
Admission: RE | Admit: 2017-09-29 | Discharge: 2017-09-29 | Disposition: A | Discharge status: Home / Self Care | Payer: Medicare Other | Source: Ambulatory Visit | Attending: Radiation Oncology | Admitting: Radiation Oncology

## 2017-09-29 ENCOUNTER — Ambulatory Visit: Payer: Medicare Other

## 2017-09-29 DIAGNOSIS — Z510 Encounter for antineoplastic radiation therapy: Secondary | ICD-10-CM | POA: Diagnosis not present

## 2017-09-29 DIAGNOSIS — C153 Malignant neoplasm of upper third of esophagus: Secondary | ICD-10-CM | POA: Diagnosis not present

## 2017-09-29 DIAGNOSIS — F419 Anxiety disorder, unspecified: Secondary | ICD-10-CM | POA: Diagnosis not present

## 2017-09-29 DIAGNOSIS — J449 Chronic obstructive pulmonary disease, unspecified: Secondary | ICD-10-CM | POA: Diagnosis not present

## 2017-09-29 DIAGNOSIS — D649 Anemia, unspecified: Secondary | ICD-10-CM | POA: Diagnosis not present

## 2017-09-29 DIAGNOSIS — F418 Other specified anxiety disorders: Secondary | ICD-10-CM | POA: Diagnosis not present

## 2017-09-29 DIAGNOSIS — C770 Secondary and unspecified malignant neoplasm of lymph nodes of head, face and neck: Secondary | ICD-10-CM | POA: Diagnosis not present

## 2017-09-30 ENCOUNTER — Telehealth: Payer: Self-pay | Admitting: Radiation Oncology

## 2017-09-30 ENCOUNTER — Ambulatory Visit: Payer: Medicare Other

## 2017-09-30 ENCOUNTER — Ambulatory Visit: Payer: Self-pay | Admitting: Endocrinology

## 2017-09-30 ENCOUNTER — Ambulatory Visit
Admission: RE | Admit: 2017-09-30 | Discharge: 2017-09-30 | Disposition: A | Discharge status: Home / Self Care | Payer: Medicare Other | Source: Ambulatory Visit | Attending: Radiation Oncology | Admitting: Radiation Oncology

## 2017-09-30 DIAGNOSIS — J449 Chronic obstructive pulmonary disease, unspecified: Secondary | ICD-10-CM | POA: Diagnosis not present

## 2017-09-30 DIAGNOSIS — F419 Anxiety disorder, unspecified: Secondary | ICD-10-CM | POA: Diagnosis not present

## 2017-09-30 DIAGNOSIS — C770 Secondary and unspecified malignant neoplasm of lymph nodes of head, face and neck: Secondary | ICD-10-CM | POA: Diagnosis not present

## 2017-09-30 DIAGNOSIS — D649 Anemia, unspecified: Secondary | ICD-10-CM | POA: Diagnosis not present

## 2017-09-30 DIAGNOSIS — C153 Malignant neoplasm of upper third of esophagus: Secondary | ICD-10-CM | POA: Diagnosis not present

## 2017-09-30 DIAGNOSIS — F418 Other specified anxiety disorders: Secondary | ICD-10-CM | POA: Diagnosis not present

## 2017-09-30 DIAGNOSIS — Z510 Encounter for antineoplastic radiation therapy: Secondary | ICD-10-CM | POA: Diagnosis not present

## 2017-09-30 NOTE — Progress Notes (Signed)
  Radiation Oncology         [phone] ________________________________  Name: Ronald Johnson MRN: [id number]  Date: 09/02/2017  DOB: March 10, 1946  SIMULATION AND TREATMENT PLANNING NOTE  DIAGNOSIS:     ICD-10-CM   1. Malignant neoplasm of upper third of esophagus (HCC) C15.3 NM PET Image Restag (PS) Skull Base To Thigh  2. Secondary malignant neoplasm of cervical lymph node ([facility]) C77.0      Site:  Right neck  NARRATIVE:  The patient was brought to the [facility].  Identity was confirmed.  All relevant records and images related to the planned course of therapy were reviewed.   Written consent to proceed with treatment was confirmed which was freely given after reviewing the details related to the planned course of therapy had been reviewed with the patient.  Then, the patient was set-up in a stable reproducible  supine position for radiation therapy.  CT images were obtained.  Surface markings were placed.    Medically necessary complex treatment device(s) for immobilization: Customized thermoplastic.   The CT images were loaded into the planning software.  Then the target and avoidance structures were contoured.  Treatment planning then occurred.  The radiation prescription was entered and confirmed.  Each of these customized fields/ complex treatment devices will be used on a daily basis during the radiation course. I have requested : 3D Simulation  I have requested a DVH of the following structures: target volume, spinal cord, lungs.   The patient will undergo daily image guidance to ensure accurate localization of the target, and adequate minimize dose to the normal surrounding structures in close proximity to the target.   PLAN:  The patient will receive 45 Gy in 25 fractions initially. The patient will then receive a 9 Gy boost.   Special treatment procedure The patient will also receive concurrent chemotherapy during the treatment. The patient may therefore  experience increased toxicity or side effects and the patient will be monitored for such problems. This may require extra lab work as necessary. This therefore constitutes a special treatment procedure.   ________________________________   Jodelle Gross, MD, PhD

## 2017-09-30 NOTE — Telephone Encounter (Signed)
The patient had a hard time tolerating his treatment mask again today despite three 0.5 mg Ativan tablets. He also has a prescription for Valium and is going to try this instead. If he needs more he will let the staff know and nursing can come around and assess him, and we can work together to determine if he needs more medication to get through treatment.

## 2017-10-01 ENCOUNTER — Ambulatory Visit
Admission: RE | Admit: 2017-10-01 | Discharge: 2017-10-01 | Disposition: A | Discharge status: Home / Self Care | Payer: Medicare Other | Source: Ambulatory Visit | Attending: Radiation Oncology | Admitting: Radiation Oncology

## 2017-10-01 ENCOUNTER — Ambulatory Visit: Payer: Medicare Other

## 2017-10-01 DIAGNOSIS — F419 Anxiety disorder, unspecified: Secondary | ICD-10-CM | POA: Diagnosis not present

## 2017-10-01 DIAGNOSIS — Z510 Encounter for antineoplastic radiation therapy: Secondary | ICD-10-CM | POA: Diagnosis not present

## 2017-10-01 DIAGNOSIS — D649 Anemia, unspecified: Secondary | ICD-10-CM | POA: Diagnosis not present

## 2017-10-01 DIAGNOSIS — C153 Malignant neoplasm of upper third of esophagus: Secondary | ICD-10-CM | POA: Diagnosis not present

## 2017-10-01 DIAGNOSIS — F418 Other specified anxiety disorders: Secondary | ICD-10-CM | POA: Diagnosis not present

## 2017-10-01 DIAGNOSIS — C770 Secondary and unspecified malignant neoplasm of lymph nodes of head, face and neck: Secondary | ICD-10-CM | POA: Diagnosis not present

## 2017-10-01 DIAGNOSIS — J449 Chronic obstructive pulmonary disease, unspecified: Secondary | ICD-10-CM | POA: Diagnosis not present

## 2017-10-02 ENCOUNTER — Inpatient Hospital Stay: Payer: Medicare Other | Attending: Nurse Practitioner | Admitting: Oncology

## 2017-10-02 ENCOUNTER — Ambulatory Visit: Payer: Medicare Other

## 2017-10-02 ENCOUNTER — Ambulatory Visit
Admission: RE | Admit: 2017-10-02 | Discharge: 2017-10-02 | Disposition: A | Discharge status: Home / Self Care | Payer: Medicare Other | Source: Ambulatory Visit | Attending: Radiation Oncology | Admitting: Radiation Oncology

## 2017-10-02 ENCOUNTER — Telehealth: Payer: Self-pay | Admitting: Oncology

## 2017-10-02 ENCOUNTER — Inpatient Hospital Stay: Payer: Medicare Other

## 2017-10-02 VITALS — BP 109/67 | HR 92 | Temp 97.7°F | Resp 18 | Ht 76.0 in | Wt 225.6 lb

## 2017-10-02 DIAGNOSIS — R490 Dysphonia: Secondary | ICD-10-CM | POA: Diagnosis not present

## 2017-10-02 DIAGNOSIS — R1310 Dysphagia, unspecified: Secondary | ICD-10-CM

## 2017-10-02 DIAGNOSIS — C153 Malignant neoplasm of upper third of esophagus: Secondary | ICD-10-CM | POA: Diagnosis not present

## 2017-10-02 DIAGNOSIS — Z86010 Personal history of colonic polyps: Secondary | ICD-10-CM | POA: Diagnosis not present

## 2017-10-02 DIAGNOSIS — C160 Malignant neoplasm of cardia: Secondary | ICD-10-CM

## 2017-10-02 DIAGNOSIS — Z510 Encounter for antineoplastic radiation therapy: Secondary | ICD-10-CM | POA: Diagnosis not present

## 2017-10-02 DIAGNOSIS — I10 Essential (primary) hypertension: Secondary | ICD-10-CM | POA: Diagnosis not present

## 2017-10-02 DIAGNOSIS — Z86711 Personal history of pulmonary embolism: Secondary | ICD-10-CM

## 2017-10-02 DIAGNOSIS — F40240 Claustrophobia: Secondary | ICD-10-CM | POA: Insufficient documentation

## 2017-10-02 DIAGNOSIS — D4702 Systemic mastocytosis: Secondary | ICD-10-CM

## 2017-10-02 DIAGNOSIS — E1151 Type 2 diabetes mellitus with diabetic peripheral angiopathy without gangrene: Secondary | ICD-10-CM | POA: Diagnosis not present

## 2017-10-02 DIAGNOSIS — Z8719 Personal history of other diseases of the digestive system: Secondary | ICD-10-CM | POA: Diagnosis not present

## 2017-10-02 DIAGNOSIS — Z79899 Other long term (current) drug therapy: Secondary | ICD-10-CM | POA: Diagnosis not present

## 2017-10-02 DIAGNOSIS — C159 Malignant neoplasm of esophagus, unspecified: Secondary | ICD-10-CM

## 2017-10-02 DIAGNOSIS — J449 Chronic obstructive pulmonary disease, unspecified: Secondary | ICD-10-CM | POA: Diagnosis not present

## 2017-10-02 DIAGNOSIS — R110 Nausea: Secondary | ICD-10-CM | POA: Diagnosis not present

## 2017-10-02 DIAGNOSIS — R070 Pain in throat: Secondary | ICD-10-CM | POA: Insufficient documentation

## 2017-10-02 DIAGNOSIS — E119 Type 2 diabetes mellitus without complications: Secondary | ICD-10-CM | POA: Diagnosis not present

## 2017-10-02 DIAGNOSIS — E1121 Type 2 diabetes mellitus with diabetic nephropathy: Secondary | ICD-10-CM | POA: Diagnosis not present

## 2017-10-02 DIAGNOSIS — C770 Secondary and unspecified malignant neoplasm of lymph nodes of head, face and neck: Secondary | ICD-10-CM | POA: Diagnosis not present

## 2017-10-02 DIAGNOSIS — D649 Anemia, unspecified: Secondary | ICD-10-CM | POA: Diagnosis not present

## 2017-10-02 DIAGNOSIS — D696 Thrombocytopenia, unspecified: Secondary | ICD-10-CM | POA: Diagnosis not present

## 2017-10-02 DIAGNOSIS — R4702 Dysphasia: Secondary | ICD-10-CM | POA: Diagnosis not present

## 2017-10-02 DIAGNOSIS — F418 Other specified anxiety disorders: Secondary | ICD-10-CM | POA: Diagnosis not present

## 2017-10-02 DIAGNOSIS — F419 Anxiety disorder, unspecified: Secondary | ICD-10-CM | POA: Diagnosis not present

## 2017-10-02 LAB — CBC WITH DIFFERENTIAL (CANCER CENTER ONLY)
Basophils Absolute: 0 10*3/uL (ref 0.0–0.1)
Basophils Relative: 0 %
EOS ABS: 0.2 10*3/uL (ref 0.0–0.5)
EOS PCT: 3 %
HCT: 42 % (ref 38.4–49.9)
Hemoglobin: 13.9 g/dL (ref 13.0–17.1)
LYMPHS ABS: 1.3 10*3/uL (ref 0.9–3.3)
LYMPHS PCT: 27 %
MCH: 32 pg (ref 27.2–33.4)
MCHC: 33.1 g/dL (ref 32.0–36.0)
MCV: 96.8 fL (ref 79.3–98.0)
MONO ABS: 0.2 10*3/uL (ref 0.1–0.9)
MONOS PCT: 5 %
Neutro Abs: 3.1 10*3/uL (ref 1.5–6.5)
Neutrophils Relative %: 65 %
PLATELETS: 153 10*3/uL (ref 140–400)
RBC: 4.34 MIL/uL (ref 4.20–5.82)
RDW: 13.1 % (ref 11.0–14.6)
WBC Count: 4.7 10*3/uL (ref 4.0–10.3)

## 2017-10-02 LAB — CMP (CANCER CENTER ONLY)
ALBUMIN: 4 g/dL (ref 3.5–5.0)
ALT: 11 U/L (ref 0–55)
AST: 18 U/L (ref 5–34)
Alkaline Phosphatase: 107 U/L (ref 40–150)
Anion gap: 12 — ABNORMAL HIGH (ref 3–11)
BUN: 18 mg/dL (ref 7–26)
CHLORIDE: 97 mmol/L — AB (ref 98–109)
CO2: 27 mmol/L (ref 22–29)
CREATININE: 1.24 mg/dL (ref 0.70–1.30)
Calcium: 9.7 mg/dL (ref 8.4–10.4)
GFR, EST NON AFRICAN AMERICAN: 58 mL/min — AB (ref >60)
GFR, Est AFR Am: >60 mL/min (ref >60)
GLUCOSE: 290 mg/dL — AB (ref 70–140)
Potassium: 4.6 mmol/L (ref 3.5–5.1)
Sodium: 136 mmol/L (ref 136–145)
Total Bilirubin: 0.8 mg/dL (ref 0.2–1.2)
Total Protein: 8.1 g/dL (ref 6.4–8.3)

## 2017-10-02 NOTE — Progress Notes (Addendum)
Subjective:   Ronald Johnson was seen in consultation in the movement disorder clinic at the request of [name], MD.  This patient is accompanied in the office by his spouse who supplements the history.  The records that were made available to me were reviewed, including records from recent hospitalization.  The evaluation is for tremor.  Tremor started approximately 2 years ago and involves the bilateral hands.   He is R handed but both hands have equal tremor.   Tremor is most noticeable when writing but also when his hands are at rest.  It is worse in the AM.   There is no family hx of tremor.    Affected by caffeine:  No. (2 cups of coffee per day) Affected by alcohol:  hasnt had EtOh for 2 years Affected by stress:  Yes.   Affected by fatigue:  No. Spills soup if on spoon:  No. Spills glass of liquid if full:  No. Affects ADL's (tying shoes, brushing teeth, etc):  No.   Any other sx's: Voice: no change Sleep: sleeps well  Vivid Dreams:  Yes.    Acting out dreams:  No. Wet Pillows: No. Postural symptoms:  Yes.    Falls?  No. Bradykinesia symptoms: no bradykinesia noted Loss of smell:  No. Loss of taste:  No. Urinary Incontinence:  No. Difficulty Swallowing:  Yes.  , trouble with meats since esophageal CA and surgery for removal of stomach and part of esophagus Handwriting, micrographia: No., just more tremulous Depression:  No. Memory changes:  Yes.  , short term; recently in the hospital after having spell and "couldn't think clearly" and couldn't get words out correct.  He tripped when got out of his truck but otherwise could walk.  No lateralizing weakness or parethesias.  Wife stated that for a few words it was like he was like talking in tongues.  Lasted 1-2 hours.  He went to the hospital and started on plavix.  Had 2 other similar episodes within 6 months but didn't go to the hospital and took a nap and when woke up he got better.  His lipitor was increased from 10 mg  to 40 mg in the hospital.   N/V:  Yes.   , had some nausea but better (due to lack of stomach) Lightheaded:  No.  Syncope: No. Diplopia:  No. Dyskinesia:  No.   Current/Previously tried tremor medications: n/a  Current medications that may exacerbate tremor:  n/a  Outside reports reviewed: historical medical records, lab reports, office notes and referral letter/letters.  Patient brought me MRI of the brain films dated June 09, 2017.  These were done without gadolinium.  I did review these.  Brain images were limited as this was primarily an MRA of the brain.  Diffusion weighted images were negative.  There was evidence of fairly significant small vessel disease from what I could see.  MRA of the brain was reported to show no evidence of focal stenosis within the internal carotid arteries.  There was some decreased flow within the left vertebral artery, but notes indicated that this could be exaggerated by the technique utilized.  10/06/17 update: Patient returns today at the request of Dr. Loanne Drilling for a new issue.  The records that were made available to me were reviewed.  This patient is accompanied in the office by his spouse who supplements the history. The patient returns to the office for evaluation of headache.  Headache started at the end of September.  He was seen for this headache in November, but at that time it was unknown at that he had developed a local recurrence of his esophageal cancer.  Headache is located in the bilateral frontal region.  Headaches are not unusual for him but they usually are not persistent.  There is no photophobia or phonophobia.  No nausea or vomiting.  Patient's headache is described as stabbing.  It goes away and comes back.  The headache lasts from 15 min to 5 hours.  He was given a prescription for tramadol in November by Dr. Loanne Drilling.  He has run out of that.  He is taking hydrocodone or oxycodone now.  He is taking something 6 days per week for headache.   He states that during radiation daily they put a mask on him and this creates a headache.  Wife notes that he has a "small eye" on the L and he thinks that it correlates with the headache. Unknown if sweats on both sides of face.    Unsure if pupils equal.  Pt doesn't notice the small eye.  Only the wife.  "small eye" comes and goes.  Wife unsure if present after wakes up or after nap.   CT of the brain was completed on August 06, 2017 to stage recurrence of cancer. this was done with and without contrast.  There is no evidence of metastatic disease on this.  No Known Allergies  Outpatient Encounter Medications as of 10/06/2017  Medication Sig  . atorvastatin (LIPITOR) 10 MG tablet Take 1 tablet (10 mg total) by mouth daily. (Patient taking differently: Take 10 mg by mouth every evening. )  . capecitabine (XELODA) 500 MG tablet Take 4 tablets (2,000 mg total) by mouth 2 (two) times daily after a meal. Take on radiation days only (Mon-Fri).  . cetirizine (ZYRTEC) 10 MG tablet Take 10 mg by mouth daily as needed for allergies.   . diazepam (VALIUM) 5 MG tablet Take 1 tablet (5 mg total) by mouth 2 (two) times daily as needed for anxiety.  Marland Kitchen escitalopram (LEXAPRO) 10 MG tablet TAKE 1 TABLET BY MOUTH  DAILY (Patient taking differently: Take 10 mg by mouth in the morning and take 10 mg by mouth at lunch)  . ibuprofen (ADVIL,MOTRIN) 200 MG tablet Take 400 mg by mouth every 6 (six) hours as needed for headache or moderate pain.   Marland Kitchen insulin NPH-regular Human (HUMULIN 70/30) (70-30) 100 UNIT/ML injection Inject 180 Units daily with breakfast into the skin. Inject 200 units into skin daily with breakfast. (Patient taking differently: Inject 100-180 Units into the skin daily with breakfast. Per sliding scale)  . LORazepam (ATIVAN) 0.5 MG tablet 1 tab 30 minutes prior to radiotherapy treatments  . Multiple Vitamin (MULTIVITAMIN) tablet Take 1 tablet by mouth daily.  Marland Kitchen oxyCODONE 10 MG TABS Take 1 tablet (10 mg  total) by mouth every 4 (four) hours as needed for severe pain.  . RABEprazole (ACIPHEX) 20 MG tablet TAKE 1 TABLET(20 MG) BY MOUTH TWICE DAILY  . sildenafil (REVATIO) 20 MG tablet 1-5 tabs as needed for ED symptoms (Patient taking differently: Take 20-100 mg by mouth as needed (for ED). )  . sucralfate (CARAFATE) 1 g tablet Take 1 tablet (1 g total) by mouth 4 (four) times daily -  with meals and at bedtime. Crush and mix in 1 oz of water to take  . traMADol (ULTRAM) 50 MG tablet Take 1 tablet (50 mg total) every 6 (six) hours as needed by  mouth (headache).  Marland Kitchen glucose blood (PRECISION XTRA TEST STRIPS) test strip Check blood sugar three times a day dx 250.01 (Patient not taking: Reported on 10/06/2017)  . Insulin Syringe-Needle U-100 30G X 3/8" 1 ML MISC Use to inject insulin 3 times daily. (Patient not taking: Reported on 10/06/2017)  . metoprolol tartrate (LOPRESSOR) 50 MG tablet Take 1 tablet 1 hour before procedure. (Patient not taking: Reported on 10/06/2017)  . [DISCONTINUED] HYDROcodone-acetaminophen (HYCET) 7.5-325 mg/15 ml solution Take 5-10 mLs by mouth every 6 (six) hours as needed for moderate pain.  . [DISCONTINUED] metoprolol succinate (TOPROL-XL) 25 MG 24 hr tablet Take 25 mg by mouth daily.   . [DISCONTINUED] oxyCODONE (OXY IR/ROXICODONE) 5 MG immediate release tablet Take 1-2 tablets (5-10 mg total) by mouth every 4 (four) hours as needed for severe pain.   Facility-Administered Encounter Medications as of 10/06/2017  Medication  . 0.9 %  sodium chloride infusion    Past Medical History:  Diagnosis Date  . Anemia 08/15/2009  . Anxiety   . Arthritis   . [name]'s esophagus 2007  . COLONIC POLYPS, ADENOMATOUS 08/01/2008, 2013, 2014  . COPD (chronic obstructive pulmonary disease) (HCC)    no per pt  . Depression   . Diabetes type 2, controlled ([place]) 03/13/2007  . Diverticulitis   . ED (erectile dysfunction)   . EMPHYSEMA 11/08/2008   no per pt  . Esophageal cancer ([place]) "dx'd  ~ 01/2015"  . GERD 03/13/2007  . GIB (gastrointestinal bleeding) 08/18/2015  . Hiatal hernia   . History of blood transfusion 08/18/2015   due to GIB  . HYPERCHOLESTEROLEMIA 02/03/2008  . HYPERTENSION 03/13/2007   pt denies, claims white coat syndrome  . Prostatism   . PULMONARY NODULE 11/24/2008  . Stomach cancer ([place]) "dx'd ~ 01/2015"  . TOBACCO ABUSE 11/24/2008    Past Surgical History:  Procedure Laterality Date  . BALLOON DILATION N/A 03/19/2016   Procedure: BALLOON DILATION;  Surgeon: Irene Shipper, MD;  Location: [facility];  Service: Endoscopy;  Laterality: N/A;  . CIRCUMCISION    . COLONOSCOPY    . COMPLETE ESOPHAGECTOMY N/A 07/25/2015   Procedure: TRANSHIATAL TOTAL ESOPHAGECTOMY COMPLETE PYLOROMYOTOMY;  Surgeon: [name], MD;  Location: [facility];  Service: Thoracic;  Laterality: N/A;  . EGD  11/19/2005  . ESOPHAGOGASTRODUODENOSCOPY N/A 03/19/2016   Procedure: ESOPHAGOGASTRODUODENOSCOPY (EGD);  Surgeon: Irene Shipper, MD;  Location: [facility];  Service: Endoscopy;  Laterality: N/A;  . ESOPHAGOGASTRODUODENOSCOPY (EGD) WITH PROPOFOL N/A 09/15/2017   Procedure: ESOPHAGOGASTRODUODENOSCOPY (EGD) WITH PROPOFOL;  Surgeon: Irene Shipper, MD;  Location: [facility];  Service: Endoscopy;  Laterality: N/A;  . EUS N/A 03/16/2015   Procedure: UPPER ENDOSCOPIC ULTRASOUND (EUS) RADIAL;  Surgeon: Milus Banister, MD;  Location: [facility];  Service: Endoscopy;  Laterality: N/A;  . FOOT FRACTURE SURGERY Right 1980   right foot w/ pins and screws  . JEJUNOSTOMY N/A 07/25/2015   Procedure: FEEDING JEJUNOSTOMY;  Surgeon: [name], MD;  Location: [facility];  Service: Thoracic;  Laterality: N/A;  . POLYPECTOMY    . removal pins and screws foot  1980   right foot  . UPPER GASTROINTESTINAL ENDOSCOPY    . VASECTOMY    . VIDEO BRONCHOSCOPY N/A 07/25/2015   Procedure: VIDEO BRONCHOSCOPY;  Surgeon: [name], MD;  Location: [facility] OR;  Service: Thoracic;  Laterality: N/A;    Social History     Socioeconomic History  . Marital status: Married    Spouse name: Not  on file  . Number of children: 2  . Years of education: Not on file  . Highest education level: Not on file  Social Needs  . Financial resource strain: Not on file  . Food insecurity - worry: Not on file  . Food insecurity - inability: Not on file  . Transportation needs - medical: Not on file  . Transportation needs - non-medical: Not on file  Occupational History  . Occupation: retired    Fish farm manager: [employer]    Comment: works Youth worker  Tobacco Use  . Smoking status: Former Smoker    Packs/day: 1.00    Years: 35.00    Pack years: 35.00    Types: Cigarettes    Last attempt to quit: 10/31/2008    Years since quitting: 8.9  . Smokeless tobacco: Never Used  Substance and Sexual Activity  . Alcohol use: No    Alcohol/week: 0.0 oz  . Drug use: No  . Sexual activity: Not Currently    Partners: Female  Other Topics Concern  . Not on file  Social History Narrative   Married, wife [place] with #2 grown children   Prior [employer], where he met his wife in [place]   Retired-prior work Personnel officer ADL's    Family Status  Relation Name Status  . Mother  Deceased       CVA  . MGM  (Not Specified)  . PGM  (Not Specified)  . Father  Deceased  . Sister 3 Alive  . Brother 1 Alive  . Neg Hx  (Not Specified)    Review of Systems A complete 10 system ROS was obtained and was negative apart from what is mentioned.   Objective:   VITALS:   Vitals:   10/06/17 0804  BP: (!) 100/58  Pulse: 84  SpO2: 94%  Weight: 225 lb (102.1 kg)  Height: '6\' 4"'$  (1.93 m)   Gen:  Appears stated age and in NAD. HEENT:  Normocephalic, atraumatic. The mucous membranes are moist. The superficial temporal arteries are without ropiness or tenderness. Cardiovascular: Regular rate and rhythm. Lungs: Clear to auscultation bilaterally. Neck: There are no carotid bruits noted  bilaterally.  NEUROLOGICAL:  Orientation:  The patient is alert and oriented x 3.   Cranial nerves: There is good facial symmetry. The pupils are equal round and reactive to light bilaterally. Fundoscopic exam reveals clear disc margins bilaterally. Extraocular muscles are intact and visual fields are full to confrontational testing. Able to sustain prolonged upgaze.  Speech is fluent and clear but is soft and hoarse. Soft palate rises symmetrically and there is no tongue deviation. Hearing is intact to conversational tone. Tone: Tone is good throughout. Sensation: Sensation is intact to light touch and pinprick throughout (facial, trunk, extremities). Vibration is decreased at the bilateral big toe and ankle. There is no extinction with double simultaneous stimulation. There is no sensory dermatomal level identified. Coordination:  The patient has no dysdiadichokinesia or dysmetria. Motor: Strength is 5/5 in the bilateral upper and lower extremities.  Shoulder shrug is equal bilaterally.  There is no pronator drift.  There are no fasciculations noted. DTR's: Deep tendon reflexes are 2-/4 at the bilateral biceps, triceps, brachioradialis, 0/4 at the bilateral patella and absent at the bilateral achilles.  Plantar responses are downgoing bilaterally. Gait and Station: The patient is able to ambulate without difficulty. The patient is able to heel toe walk without any difficulty. The patient is able to ambulate in a  tandem fashion. The patient is able to stand in the Romberg position.   MOVEMENT EXAM: Tremor:  There is almost no tremor of the outstretched hands.  No tremor with intention.  No tremor when given a weight.  No difficulty with Archimedes spirals.  He has mild tremor when asked to pour water from one glass to another but really does not spill the water.  Lab Results  Component Value Date   HGBA1C 6.9 06/30/2017   Lab Results  Component Value Date   TSH 1.95 05/06/2017   Lab Results   Component Value Date   VITAMINB12 545 08/03/2013        Assessment/Plan:   1.  Headache with ptosis and new recurrence of lung CA  -he will have an MRI brain with and without gad  -he will have a MRA neck given new ptosis over the last 3 weeks  - sounds like horners syndrome.  Does have a mass in the neck and suspect this is etiology  -talked about rebound phenomenon and what this is.  Limit opioid for headache treatment  -start topamax for prevention.  Risks, benefits, side effects and alternative therapies were discussed.  The opportunity to ask questions was given and they were answered to the best of my ability.  The patient expressed understanding and willingness to follow the outlined treatment protocols.  -HA not consistent with temporal arteritis.  They asked about this.  No vision change.  Short lasting.  Made worse by radiation mask 2.  Peripheral neuropathy  -The patient has clinical examination evidence of a diffuse peripheral neuropathy, which is likely due to peripheral neuropathy.  Safety was discussed.  3. Follow up is anticipated in the next few months, sooner should new neurologic issues arise.  Much greater than 50% of this visit was spent in counseling and coordinating care.  Total face to face time:  30 min    CC:  [name], MD

## 2017-10-02 NOTE — Telephone Encounter (Signed)
Scheduled appt per 2/14 los - Gave patient AVS and calender per los.  

## 2017-10-02 NOTE — Progress Notes (Signed)
[facility] OFFICE PROGRESS NOTE   Diagnosis: Esophagus cancer  INTERVAL HISTORY:   Ronald Johnson returns as scheduled.  He began radiation and concurrent capecitabine 09/22/2017.  He continues to have dysphasia, but reports no difficulty swallowing the capecitabine pills.  No mouth sores, diarrhea, or hand/foot pain.  He has noted some improvement in hoarseness.  Objective:  Vital signs in last 24 hours:  Blood pressure 109/67, pulse 92, temperature 97.7 F (36.5 C), resp. rate 18, height 6\' 4"  (1.93 m), weight 225 lb 9.6 oz (102.3 kg), SpO2 95 %.    HEENT: No thrush or ulcers Resp: Distant breath sounds, no respiratory distress Cardio: Regular rate and rhythm GI: No hepatomegaly, no mass, nontender Vascular: No leg edema Skin: Palms and soles without erythema or skin breakdown    Lab Results:  Lab Results  Component Value Date   WBC 4.7 10/02/2017   HGB 13.2 08/26/2017   HCT 42.0 10/02/2017   MCV 96.8 10/02/2017   PLT 153 10/02/2017   NEUTROABS 3.1 10/02/2017    CMP     Component Value Date/Time   NA 136 10/02/2017 0942   NA 138 08/05/2017 1547   K 4.6 10/02/2017 0942   K 4.0 08/05/2017 1547   CL 97 (L) 10/02/2017 0942   CO2 27 10/02/2017 0942   CO2 27 08/05/2017 1547   GLUCOSE 290 (H) 10/02/2017 0942   GLUCOSE 184 (H) 08/05/2017 1547   GLUCOSE 129 (H) 06/30/2006 0809   BUN 18 10/02/2017 0942   BUN 18.3 08/05/2017 1547   CREATININE 1.24 10/02/2017 0942   CREATININE 1.3 08/05/2017 1547   CALCIUM 9.7 10/02/2017 0942   CALCIUM 9.5 08/05/2017 1547   PROT 8.1 10/02/2017 0942   PROT 8.0 08/05/2017 1547   ALBUMIN 4.0 10/02/2017 0942   ALBUMIN 3.6 08/05/2017 1547   AST 18 10/02/2017 0942   AST 28 08/05/2017 1547   ALT 11 10/02/2017 0942   ALT 27 08/05/2017 1547   ALKPHOS 107 10/02/2017 0942   ALKPHOS 95 08/05/2017 1547   BILITOT 0.8 10/02/2017 0942   BILITOT 0.50 08/05/2017 1547   GFRNONAA 58 (L) 10/02/2017 0942   GFRAA >60 10/02/2017 0942     Lab Results  Component Value Date   CEA1 23.92 (H) 08/05/2017     Medications: I have reviewed the patient's current medications.   Assessment/Plan: 1. 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 EUS 03/16/2015 confirmed a clinical stage IIb (uT3,uN1) tumor ? Staging CTs of the chest, abdomen, and pelvis with no evidence of metastatic disease ? Initiation of radiation 04/19/2015, cycle 1 Taxol/carboplatin 04/20/2015: Radiation completed 05/29/2015; the fifth and final week of Taxol/carboplatin 05/18/2015 ? Restaging CTs 07/20/2015 revealed no evidence of metastatic disease, incidental left lower lobe pulmonary embolism ? Transhiatal total esophagectomy 07/25/2015 confirmed a ypT3, ypN2 with 4/5 positive lymph nodes, lymphovascular invasion, perineural invasion, negative resection margins ? CT neck 07/25/2017-4-5 cm mass in the right lower neck upper mediastinum immediately adjacent to the esophageal anastomosis. 12 mm mass within the right parotid enlarged since the previous PET study where it was about half that size. Question few small pulmonary nodules. Borderline enlarged mediastinal lymph nodes. ? CTs brain/chest/abdomen/pelvis 08/06/2017-ill-defined mass at the right thoracic inlet extending into the right superior mediastinum; new bilateral but right much greater than left peripheral reticulonodular and groundglass opacities; scattered small paratracheal and anterior mediastinal lymph nodes; small bilateral axillary lymph nodes. Brain CT with no evidence of metastasis. ? 08/26/2017 biopsy right  supraclavicular lymph node-very scant fragment of benign lymph node. ? PET scan 09/11/2017-poorly marginated heterogeneously hypermetabolic soft tissue mass at the right thoracic inlet extending laterally from the esophagogastric anastomosis in the neck.  No definite hypermetabolic metastatic disease.  Patchy tree-in-bud opacities throughout the lungs  significantly decreased.  Persistent scattered subcentimeter solid pulmonary nodules in both lungs stable to decreased.  Stable irregular 1.7 cm nodular bandlike opacity anterior left lower lobe with associated low-level metabolism.  Nonspecific mildly hypermetabolic subcarinal and bilateral hilar lymph nodes.  Nonspecific mildly hypermetabolic nodular focus of soft tissue in the upper left retroperitoneum anterior to the adrenal gland. ? Upper endoscopy 09/15/2017-at 20 cm from the incisors tight stricture at the anastomosis measuring only a few millimeters in diameter.  Surrounding tissues firm, slightly friable, noncompliant.  Stricture was dilated with a 15 mm balloon several times.  Diameter of stricture improved but still would not permit passage of a standard upper endoscope.  Friable areas biopsied-invasive adenocarcinoma. ? Initiation of radiation and concurrent capecitabine 09/22/2017  2. Postprandial subxiphoid pain secondary to #1  3. Diabetes-elevated blood sugar readings at the [facility] and at home  4. COPD  5. Depression-Improved with Wellbutrin  6. Hypertension  7. History of adenomatous colon polyps  8. History of diabetic related neuropathy, peripheral arterial disease, and nephropathy  9. History of nausea-likely related to radiation esophagitis/gastritis  10. Incidental pulmonary embolism noted on the CT 07/20/2015, treated with Xarelto  11. Admission 08/18/2015 with GI bleeding while on Xarelto, anticoagulation therapy discontinued  12. Dysphagia-improved with esophageal dilatation procedures by Dr. Henrene Pastor, progressive dysphagia secondary to local recurrence of esophagus cancer with obstruction at the esophagus anastomosis  13.History ofMild thrombocytopenia  14. CT scan abdomen/pelvis 10/09/2016 (done to evaluate persistent nausea, epigastric pain)-no acute findings or explanation for the patient's symptoms. Resolving postsurgical  changes from presumed previous esophagectomy and gastric pull-through. No evidence of metastatic disease.   Disposition: Ronald Johnson appears to be tolerating treatment well.  He will continue Cytovene.  He will return for an office and lab visit 10/13/2017.  He will contact us in the interim for new symptoms or if he has difficulty swallowing in the capecitabine pills.  15 minutes were spent with the patient today.  The majority of the time was used for counseling and coordination of care.  Betsy Coder, MD  10/02/2017  12:06 PM

## 2017-10-03 ENCOUNTER — Other Ambulatory Visit: Payer: Self-pay | Admitting: Radiation Oncology

## 2017-10-03 ENCOUNTER — Ambulatory Visit
Admission: RE | Admit: 2017-10-03 | Discharge: 2017-10-03 | Disposition: A | Discharge status: Home / Self Care | Payer: Medicare Other | Source: Ambulatory Visit | Attending: Radiation Oncology | Admitting: Radiation Oncology

## 2017-10-03 ENCOUNTER — Ambulatory Visit: Payer: Medicare Other

## 2017-10-03 DIAGNOSIS — C770 Secondary and unspecified malignant neoplasm of lymph nodes of head, face and neck: Secondary | ICD-10-CM | POA: Diagnosis not present

## 2017-10-03 DIAGNOSIS — F418 Other specified anxiety disorders: Secondary | ICD-10-CM | POA: Diagnosis not present

## 2017-10-03 DIAGNOSIS — F419 Anxiety disorder, unspecified: Secondary | ICD-10-CM | POA: Diagnosis not present

## 2017-10-03 DIAGNOSIS — D649 Anemia, unspecified: Secondary | ICD-10-CM | POA: Diagnosis not present

## 2017-10-03 DIAGNOSIS — Z510 Encounter for antineoplastic radiation therapy: Secondary | ICD-10-CM | POA: Diagnosis not present

## 2017-10-03 DIAGNOSIS — C153 Malignant neoplasm of upper third of esophagus: Secondary | ICD-10-CM | POA: Diagnosis not present

## 2017-10-03 DIAGNOSIS — J449 Chronic obstructive pulmonary disease, unspecified: Secondary | ICD-10-CM | POA: Diagnosis not present

## 2017-10-03 MED ORDER — OXYCODONE HCL 10 MG PO TABS: 10.0000 mg | ORAL_TABLET | ORAL | 0 refills | Status: DC | PRN

## 2017-10-06 ENCOUNTER — Ambulatory Visit
Admission: RE | Admit: 2017-10-06 | Discharge: 2017-10-06 | Disposition: A | Discharge status: Home / Self Care | Payer: Medicare Other | Source: Ambulatory Visit | Attending: Radiation Oncology | Admitting: Radiation Oncology

## 2017-10-06 ENCOUNTER — Ambulatory Visit (INDEPENDENT_AMBULATORY_CARE_PROVIDER_SITE_OTHER): Payer: Medicare Other | Admitting: Neurology

## 2017-10-06 ENCOUNTER — Ambulatory Visit: Payer: Medicare Other

## 2017-10-06 ENCOUNTER — Encounter: Payer: Self-pay | Admitting: Neurology

## 2017-10-06 VITALS — BP 100/58 | HR 84 | Ht 76.0 in | Wt 225.0 lb

## 2017-10-06 DIAGNOSIS — J449 Chronic obstructive pulmonary disease, unspecified: Secondary | ICD-10-CM | POA: Diagnosis not present

## 2017-10-06 DIAGNOSIS — R51 Headache: Secondary | ICD-10-CM | POA: Diagnosis not present

## 2017-10-06 DIAGNOSIS — G902 Horner's syndrome: Secondary | ICD-10-CM

## 2017-10-06 DIAGNOSIS — R519 Headache, unspecified: Secondary | ICD-10-CM

## 2017-10-06 DIAGNOSIS — H02402 Unspecified ptosis of left eyelid: Secondary | ICD-10-CM

## 2017-10-06 DIAGNOSIS — C159 Malignant neoplasm of esophagus, unspecified: Secondary | ICD-10-CM

## 2017-10-06 DIAGNOSIS — C770 Secondary and unspecified malignant neoplasm of lymph nodes of head, face and neck: Secondary | ICD-10-CM | POA: Diagnosis not present

## 2017-10-06 DIAGNOSIS — C153 Malignant neoplasm of upper third of esophagus: Secondary | ICD-10-CM | POA: Diagnosis not present

## 2017-10-06 DIAGNOSIS — D649 Anemia, unspecified: Secondary | ICD-10-CM | POA: Diagnosis not present

## 2017-10-06 DIAGNOSIS — F419 Anxiety disorder, unspecified: Secondary | ICD-10-CM | POA: Diagnosis not present

## 2017-10-06 DIAGNOSIS — Z510 Encounter for antineoplastic radiation therapy: Secondary | ICD-10-CM | POA: Diagnosis not present

## 2017-10-06 DIAGNOSIS — F418 Other specified anxiety disorders: Secondary | ICD-10-CM | POA: Diagnosis not present

## 2017-10-06 MED ORDER — TOPIRAMATE 25 MG PO TABS: ORAL_TABLET | ORAL | 0 refills | Status: DC

## 2017-10-06 MED ORDER — TOPIRAMATE 100 MG PO TABS: 100.0000 mg | ORAL_TABLET | Freq: Every day | ORAL | 3 refills | Status: DC

## 2017-10-06 NOTE — Addendum Note (Signed)
Addended byAnnamaria Helling on: 10/06/2017 09:09 AM   Modules accepted: Orders

## 2017-10-06 NOTE — Patient Instructions (Signed)
1. We have sent a referral to [facility] for your MRI and they will call you directly to schedule your appt. They are located at [address]. If you need to contact them directly please call [phone].  2. Start Topamax as follows: Topamax 25 mg - one tablet daily for one week THEN two tablets daily for one week THEN three tablets daily for one week THEN switch to Topamax 100 mg one tablet daily

## 2017-10-07 ENCOUNTER — Ambulatory Visit
Admission: RE | Admit: 2017-10-07 | Discharge: 2017-10-07 | Disposition: A | Discharge status: Home / Self Care | Payer: Medicare Other | Source: Ambulatory Visit | Attending: Radiation Oncology | Admitting: Radiation Oncology

## 2017-10-07 ENCOUNTER — Ambulatory Visit: Payer: Medicare Other

## 2017-10-07 DIAGNOSIS — C153 Malignant neoplasm of upper third of esophagus: Secondary | ICD-10-CM | POA: Diagnosis not present

## 2017-10-07 DIAGNOSIS — C770 Secondary and unspecified malignant neoplasm of lymph nodes of head, face and neck: Secondary | ICD-10-CM | POA: Diagnosis not present

## 2017-10-07 DIAGNOSIS — Z510 Encounter for antineoplastic radiation therapy: Secondary | ICD-10-CM | POA: Diagnosis not present

## 2017-10-07 DIAGNOSIS — D649 Anemia, unspecified: Secondary | ICD-10-CM | POA: Diagnosis not present

## 2017-10-07 DIAGNOSIS — F418 Other specified anxiety disorders: Secondary | ICD-10-CM | POA: Diagnosis not present

## 2017-10-07 DIAGNOSIS — F419 Anxiety disorder, unspecified: Secondary | ICD-10-CM | POA: Diagnosis not present

## 2017-10-07 DIAGNOSIS — J449 Chronic obstructive pulmonary disease, unspecified: Secondary | ICD-10-CM | POA: Diagnosis not present

## 2017-10-08 ENCOUNTER — Ambulatory Visit
Admission: RE | Admit: 2017-10-08 | Discharge: 2017-10-08 | Disposition: A | Discharge status: Home / Self Care | Payer: Medicare Other | Source: Ambulatory Visit | Attending: Radiation Oncology | Admitting: Radiation Oncology

## 2017-10-08 ENCOUNTER — Ambulatory Visit: Payer: Medicare Other

## 2017-10-08 DIAGNOSIS — C770 Secondary and unspecified malignant neoplasm of lymph nodes of head, face and neck: Secondary | ICD-10-CM | POA: Diagnosis not present

## 2017-10-08 DIAGNOSIS — F419 Anxiety disorder, unspecified: Secondary | ICD-10-CM | POA: Diagnosis not present

## 2017-10-08 DIAGNOSIS — J449 Chronic obstructive pulmonary disease, unspecified: Secondary | ICD-10-CM | POA: Diagnosis not present

## 2017-10-08 DIAGNOSIS — F418 Other specified anxiety disorders: Secondary | ICD-10-CM | POA: Diagnosis not present

## 2017-10-08 DIAGNOSIS — C153 Malignant neoplasm of upper third of esophagus: Secondary | ICD-10-CM | POA: Diagnosis not present

## 2017-10-08 DIAGNOSIS — D649 Anemia, unspecified: Secondary | ICD-10-CM | POA: Diagnosis not present

## 2017-10-08 DIAGNOSIS — Z510 Encounter for antineoplastic radiation therapy: Secondary | ICD-10-CM | POA: Diagnosis not present

## 2017-10-09 ENCOUNTER — Ambulatory Visit: Payer: Medicare Other

## 2017-10-09 ENCOUNTER — Ambulatory Visit
Admission: RE | Admit: 2017-10-09 | Discharge: 2017-10-09 | Disposition: A | Discharge status: Home / Self Care | Payer: Medicare Other | Source: Ambulatory Visit | Attending: Radiation Oncology | Admitting: Radiation Oncology

## 2017-10-09 DIAGNOSIS — C153 Malignant neoplasm of upper third of esophagus: Secondary | ICD-10-CM | POA: Diagnosis not present

## 2017-10-09 DIAGNOSIS — C770 Secondary and unspecified malignant neoplasm of lymph nodes of head, face and neck: Secondary | ICD-10-CM | POA: Diagnosis not present

## 2017-10-09 DIAGNOSIS — F419 Anxiety disorder, unspecified: Secondary | ICD-10-CM | POA: Diagnosis not present

## 2017-10-09 DIAGNOSIS — J449 Chronic obstructive pulmonary disease, unspecified: Secondary | ICD-10-CM | POA: Diagnosis not present

## 2017-10-09 DIAGNOSIS — D649 Anemia, unspecified: Secondary | ICD-10-CM | POA: Diagnosis not present

## 2017-10-09 DIAGNOSIS — Z510 Encounter for antineoplastic radiation therapy: Secondary | ICD-10-CM | POA: Diagnosis not present

## 2017-10-09 DIAGNOSIS — F418 Other specified anxiety disorders: Secondary | ICD-10-CM | POA: Diagnosis not present

## 2017-10-10 ENCOUNTER — Ambulatory Visit: Payer: Medicare Other

## 2017-10-10 ENCOUNTER — Ambulatory Visit
Admission: RE | Admit: 2017-10-10 | Discharge: 2017-10-10 | Disposition: A | Discharge status: Home / Self Care | Payer: Medicare Other | Source: Ambulatory Visit | Attending: Radiation Oncology | Admitting: Radiation Oncology

## 2017-10-10 DIAGNOSIS — F419 Anxiety disorder, unspecified: Secondary | ICD-10-CM | POA: Diagnosis not present

## 2017-10-10 DIAGNOSIS — F418 Other specified anxiety disorders: Secondary | ICD-10-CM | POA: Diagnosis not present

## 2017-10-10 DIAGNOSIS — Z510 Encounter for antineoplastic radiation therapy: Secondary | ICD-10-CM | POA: Diagnosis not present

## 2017-10-10 DIAGNOSIS — J449 Chronic obstructive pulmonary disease, unspecified: Secondary | ICD-10-CM | POA: Diagnosis not present

## 2017-10-10 DIAGNOSIS — D649 Anemia, unspecified: Secondary | ICD-10-CM | POA: Diagnosis not present

## 2017-10-10 DIAGNOSIS — C770 Secondary and unspecified malignant neoplasm of lymph nodes of head, face and neck: Secondary | ICD-10-CM | POA: Diagnosis not present

## 2017-10-10 DIAGNOSIS — C159 Malignant neoplasm of esophagus, unspecified: Secondary | ICD-10-CM

## 2017-10-10 DIAGNOSIS — C153 Malignant neoplasm of upper third of esophagus: Secondary | ICD-10-CM | POA: Diagnosis not present

## 2017-10-10 MED ORDER — LORAZEPAM 1 MG PO TABS: 1.0000 mg | ORAL_TABLET | Freq: Three times a day (TID) | ORAL | 0 refills | Status: DC | PRN

## 2017-10-10 MED ORDER — SONAFINE EX EMUL
1.0000 "application " | Freq: Two times a day (BID) | CUTANEOUS | Status: DC
  Administered 2017-10-10: 1 via TOPICAL

## 2017-10-13 ENCOUNTER — Inpatient Hospital Stay (HOSPITAL_BASED_OUTPATIENT_CLINIC_OR_DEPARTMENT_OTHER): Payer: Medicare Other | Admitting: Nurse Practitioner

## 2017-10-13 ENCOUNTER — Telehealth: Payer: Self-pay | Admitting: Nurse Practitioner

## 2017-10-13 ENCOUNTER — Ambulatory Visit: Payer: Medicare Other

## 2017-10-13 ENCOUNTER — Inpatient Hospital Stay: Payer: Medicare Other

## 2017-10-13 ENCOUNTER — Encounter: Payer: Self-pay | Admitting: Nurse Practitioner

## 2017-10-13 ENCOUNTER — Ambulatory Visit
Admission: RE | Admit: 2017-10-13 | Discharge: 2017-10-13 | Disposition: A | Discharge status: Home / Self Care | Payer: Medicare Other | Source: Ambulatory Visit | Attending: Radiation Oncology | Admitting: Radiation Oncology

## 2017-10-13 VITALS — BP 115/54 | HR 108 | Temp 97.6°F | Resp 18 | Ht 76.0 in | Wt 222.2 lb

## 2017-10-13 DIAGNOSIS — J449 Chronic obstructive pulmonary disease, unspecified: Secondary | ICD-10-CM | POA: Diagnosis not present

## 2017-10-13 DIAGNOSIS — R110 Nausea: Secondary | ICD-10-CM | POA: Diagnosis not present

## 2017-10-13 DIAGNOSIS — F40240 Claustrophobia: Secondary | ICD-10-CM | POA: Diagnosis not present

## 2017-10-13 DIAGNOSIS — C159 Malignant neoplasm of esophagus, unspecified: Secondary | ICD-10-CM

## 2017-10-13 DIAGNOSIS — E1121 Type 2 diabetes mellitus with diabetic nephropathy: Secondary | ICD-10-CM | POA: Diagnosis not present

## 2017-10-13 DIAGNOSIS — Z86010 Personal history of colonic polyps: Secondary | ICD-10-CM

## 2017-10-13 DIAGNOSIS — Z8719 Personal history of other diseases of the digestive system: Secondary | ICD-10-CM

## 2017-10-13 DIAGNOSIS — C160 Malignant neoplasm of cardia: Secondary | ICD-10-CM | POA: Diagnosis not present

## 2017-10-13 DIAGNOSIS — Z510 Encounter for antineoplastic radiation therapy: Secondary | ICD-10-CM | POA: Diagnosis not present

## 2017-10-13 DIAGNOSIS — Z79899 Other long term (current) drug therapy: Secondary | ICD-10-CM | POA: Diagnosis not present

## 2017-10-13 DIAGNOSIS — F419 Anxiety disorder, unspecified: Secondary | ICD-10-CM | POA: Diagnosis not present

## 2017-10-13 DIAGNOSIS — Z86711 Personal history of pulmonary embolism: Secondary | ICD-10-CM

## 2017-10-13 DIAGNOSIS — C770 Secondary and unspecified malignant neoplasm of lymph nodes of head, face and neck: Secondary | ICD-10-CM | POA: Diagnosis not present

## 2017-10-13 DIAGNOSIS — D649 Anemia, unspecified: Secondary | ICD-10-CM | POA: Diagnosis not present

## 2017-10-13 DIAGNOSIS — C153 Malignant neoplasm of upper third of esophagus: Secondary | ICD-10-CM | POA: Diagnosis not present

## 2017-10-13 DIAGNOSIS — I10 Essential (primary) hypertension: Secondary | ICD-10-CM

## 2017-10-13 DIAGNOSIS — D696 Thrombocytopenia, unspecified: Secondary | ICD-10-CM

## 2017-10-13 DIAGNOSIS — F418 Other specified anxiety disorders: Secondary | ICD-10-CM | POA: Diagnosis not present

## 2017-10-13 DIAGNOSIS — E1151 Type 2 diabetes mellitus with diabetic peripheral angiopathy without gangrene: Secondary | ICD-10-CM | POA: Diagnosis not present

## 2017-10-13 DIAGNOSIS — R4702 Dysphasia: Secondary | ICD-10-CM | POA: Diagnosis not present

## 2017-10-13 DIAGNOSIS — R070 Pain in throat: Secondary | ICD-10-CM | POA: Diagnosis not present

## 2017-10-13 LAB — CBC WITH DIFFERENTIAL (CANCER CENTER ONLY)
Basophils Absolute: 0.1 10*3/uL (ref 0.0–0.1)
Basophils Relative: 1 %
EOS ABS: 0.3 10*3/uL (ref 0.0–0.5)
Eosinophils Relative: 6 %
HEMATOCRIT: 38.8 % (ref 38.4–49.9)
HEMOGLOBIN: 13 g/dL (ref 13.0–17.1)
LYMPHS ABS: 0.8 10*3/uL — AB (ref 0.9–3.3)
Lymphocytes Relative: 20 %
MCH: 32.3 pg (ref 27.2–33.4)
MCHC: 33.5 g/dL (ref 32.0–36.0)
MCV: 96.3 fL (ref 79.3–98.0)
MONOS PCT: 8 %
Monocytes Absolute: 0.3 10*3/uL (ref 0.1–0.9)
NEUTROS ABS: 2.6 10*3/uL (ref 1.5–6.5)
NEUTROS PCT: 65 %
Platelet Count: 134 10*3/uL — ABNORMAL LOW (ref 140–400)
RBC: 4.03 MIL/uL — ABNORMAL LOW (ref 4.20–5.82)
RDW: 14 % (ref 11.0–14.6)
WBC Count: 4.1 10*3/uL (ref 4.0–10.3)

## 2017-10-13 NOTE — Telephone Encounter (Signed)
No 2/25 los.  

## 2017-10-13 NOTE — Progress Notes (Signed)
[facility] OFFICE PROGRESS NOTE   Diagnosis: Esophagus cancer  INTERVAL HISTORY:   Mr. Montesdeoca returns as scheduled.  He continues radiation and Xeloda. He has some nausea.  No vomiting.  No mouth sores.  No diarrhea.  No hand or foot pain or redness.  He continues to have pain with swallowing. He describes a "sore throat".  He is able to eat if he takes Carafate first.  His main complaint today is a claustrophobia with the radiation treatments.  He takes Ativan prior to each radiation treatment.  Objective:  Vital signs in last 24 hours:  Blood pressure (!) 115/54, pulse (!) 108, temperature 97.6 F (36.4 C), temperature source Oral, resp. rate 18, height 6\' 4"  (1.93 m), weight 222 lb 3.2 oz (100.8 kg), SpO2 97 %.    HEENT: No thrush or ulcers. Lymphatics: Firm fullness right supraclavicular fossa. Resp: Distant breath sounds.  No respiratory distress. Cardio: Regular rate and rhythm. GI: Abdomen soft and nontender.  No hepatomegaly.  No mass. Vascular: No leg edema. Skin: Palms without erythema.  Erythema over the upper anterior chest wall.   Lab Results:  Lab Results  Component Value Date   WBC 4.1 10/13/2017   HGB 13.2 08/26/2017   HCT 38.8 10/13/2017   MCV 96.3 10/13/2017   PLT 134 (L) 10/13/2017   NEUTROABS 2.6 10/13/2017    Imaging:  No results found.  Medications: I have reviewed the patient's current medications.  Assessment/Plan: 1. 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 EUS 03/16/2015 confirmed a clinical stage IIb (uT3,uN1) tumor ? Staging CTs of the chest, abdomen, and pelvis with no evidence of metastatic disease ? Initiation of radiation 04/19/2015, cycle 1 Taxol/carboplatin 04/20/2015: Radiation completed 05/29/2015; the fifth and final week of Taxol/carboplatin 05/18/2015 ? Restaging CTs 07/20/2015 revealed no evidence of metastatic disease, incidental left lower lobe pulmonary  embolism ? Transhiatal total esophagectomy 07/25/2015 confirmed a ypT3, ypN2 with 4/5 positive lymph nodes, lymphovascular invasion, perineural invasion, negative resection margins ? CT neck 07/25/2017-4-5 cm mass in the right lower neck upper mediastinum immediately adjacent to the esophageal anastomosis. 12 mm mass within the right parotid enlarged since the previous PET study where it was about half that size. Question few small pulmonary nodules. Borderline enlarged mediastinal lymph nodes. ? CTs brain/chest/abdomen/pelvis 08/06/2017-ill-defined mass at the right thoracic inlet extending into the right superior mediastinum; new bilateral but right much greater than left peripheral reticulonodular and groundglass opacities; scattered small paratracheal and anterior mediastinal lymph nodes; small bilateral axillary lymph nodes. Brain CT with no evidence of metastasis. ? 08/26/2017 biopsy right supraclavicular lymph node-very scant fragment of benign lymph node. ? PET scan 09/11/2017-poorly marginated heterogeneously hypermetabolic soft tissue mass at the right thoracic inlet extending laterally from the esophagogastric anastomosis in the neck. No definite hypermetabolic metastatic disease.Patchy tree-in-bud opacities throughout the lungs significantly decreased. Persistent scattered subcentimeter solid pulmonary nodules in both lungs stable to decreased. Stable irregular 1.7 cm nodular bandlike opacity anterior left lower lobe with associated low-level metabolism. Nonspecific mildly hypermetabolic subcarinal and bilateral hilar lymph nodes. Nonspecific mildly hypermetabolic nodular focus of soft tissue in the upper left retroperitoneum anterior to the adrenal gland. ? Upper endoscopy 09/15/2017-at 20 cm from the incisors tight stricture at the anastomosis measuring only a few millimeters in diameter. Surrounding tissues firm, slightly friable, noncompliant. Stricture was dilated with a 15 mm balloon  several times. Diameter of stricture improved but still would not permit passage of a standard upper endoscope.  Friable areas biopsied-invasive adenocarcinoma. ? Initiation of radiation and concurrent capecitabine 09/22/2017  2. Postprandial subxiphoid pain secondary to #1  3. Diabetes-elevated blood sugar readings at the [facility] and at home  4. COPD  5. Depression-Improved with Wellbutrin  6. Hypertension  7. History of adenomatous colon polyps  8. History of diabetic related neuropathy, peripheral arterial disease, and nephropathy  9. History of nausea-likely related to radiation esophagitis/gastritis  10. Incidental pulmonary embolism noted on the CT 07/20/2015, treated with Xarelto  11. Admission 08/18/2015 with GI bleeding while on Xarelto, anticoagulation therapy discontinued  12. Dysphagia-improved with esophageal dilatation procedures by Dr. Henrene Pastor, progressive dysphagia secondary to local recurrence of esophagus cancer with obstruction at the esophagus anastomosis  13.History ofMild thrombocytopenia  14. CT scan abdomen/pelvis 10/09/2016 (done to evaluate persistent nausea, epigastric pain)-no acute findings or explanation for the patient's symptoms. Resolving postsurgical changes from presumed previous esophagectomy and gastric pull-through. No evidence of metastatic disease.    Disposition: Mr. Hurrell appears stable.  He will continue radiation/Xeloda.  He will discuss the claustrophobia with radiation oncology.  We will see him back in 2 weeks.  He will contact the office in the interim with any problems.  Patient seen with Dr. [name].  Ned Card ANP/GNP-BC   10/13/2017  9:12 AM  This was a shared visit with Ned Card.  Mr. Cayson appears to be tolerating Xeloda well.  He plans to continue concurrent Xeloda and radiation.  We discussed the option of salvage systemic therapy if he is not able to complete the planned course of  radiation.  He will return for an office visit in 2 weeks.    Julieanne Manson, MD

## 2017-10-14 ENCOUNTER — Ambulatory Visit
Admission: RE | Admit: 2017-10-14 | Discharge: 2017-10-14 | Disposition: A | Discharge status: Home / Self Care | Payer: Medicare Other | Source: Ambulatory Visit | Attending: Radiation Oncology | Admitting: Radiation Oncology

## 2017-10-14 ENCOUNTER — Ambulatory Visit (INDEPENDENT_AMBULATORY_CARE_PROVIDER_SITE_OTHER): Payer: Medicare Other | Admitting: Endocrinology

## 2017-10-14 ENCOUNTER — Ambulatory Visit: Payer: Medicare Other

## 2017-10-14 ENCOUNTER — Encounter: Payer: Self-pay | Admitting: Endocrinology

## 2017-10-14 VITALS — BP 120/60 | HR 110 | Ht 76.0 in | Wt 223.0 lb

## 2017-10-14 DIAGNOSIS — E1151 Type 2 diabetes mellitus with diabetic peripheral angiopathy without gangrene: Secondary | ICD-10-CM

## 2017-10-14 DIAGNOSIS — Z510 Encounter for antineoplastic radiation therapy: Secondary | ICD-10-CM | POA: Diagnosis not present

## 2017-10-14 DIAGNOSIS — D649 Anemia, unspecified: Secondary | ICD-10-CM | POA: Diagnosis not present

## 2017-10-14 DIAGNOSIS — J449 Chronic obstructive pulmonary disease, unspecified: Secondary | ICD-10-CM | POA: Diagnosis not present

## 2017-10-14 DIAGNOSIS — C153 Malignant neoplasm of upper third of esophagus: Secondary | ICD-10-CM | POA: Diagnosis not present

## 2017-10-14 DIAGNOSIS — Z794 Long term (current) use of insulin: Secondary | ICD-10-CM | POA: Diagnosis not present

## 2017-10-14 DIAGNOSIS — C770 Secondary and unspecified malignant neoplasm of lymph nodes of head, face and neck: Secondary | ICD-10-CM | POA: Diagnosis not present

## 2017-10-14 DIAGNOSIS — F418 Other specified anxiety disorders: Secondary | ICD-10-CM | POA: Diagnosis not present

## 2017-10-14 DIAGNOSIS — F419 Anxiety disorder, unspecified: Secondary | ICD-10-CM | POA: Diagnosis not present

## 2017-10-14 LAB — POCT GLYCOSYLATED HEMOGLOBIN (HGB A1C): HEMOGLOBIN A1C: 7.4

## 2017-10-14 MED ORDER — INSULIN NPH ISOPHANE & REGULAR (70-30) 100 UNIT/ML ~~LOC~~ SUSP: 100.0000 [IU] | Freq: Every day | SUBCUTANEOUS | 11 refills | Status: DC

## 2017-10-14 MED ORDER — ESCITALOPRAM OXALATE 20 MG PO TABS: 20.0000 mg | ORAL_TABLET | Freq: Every day | ORAL | 3 refills | Status: AC

## 2017-10-14 NOTE — Patient Instructions (Signed)
Please continue the same medications. Please continue to carefully watch the blood sugar, because your cancer can cause variation in your insulin need.  Please come back for a follow-up appointment in 3 months

## 2017-10-14 NOTE — Progress Notes (Signed)
Subjective:    Patient ID: Ronald Johnson, male    DOB: 11/10/1945, 72 y.o.   MRN: [id number]  HPI Pt returns for f/u of diabetes mellitus: DM type: Insulin-requiring type 2.  Dx'ed: 3419 Complications: polyneuropathy, PAD, and renal insuff.  Therapy: insulin since 2004.  DKA: never.  Severe hypoglycemia: never.   Pancreatitis: never.   Other: he changed to qd insulin, after he did not achieve good control on multiple daily injections; he cannot afford insulin analogs; insulin requirement has decreased since cancer dx; he changed to QAM 70/30, due to pattern of cbg's.  Interval history: no cbg record, but states cbg's are well-controlled.  There is no trend throughout the day.  He takes 100 units qam.  He hs getting XRT Past Medical History:  Diagnosis Date  . Anemia 08/15/2009  . Anxiety   . Arthritis   . [name]'s esophagus 2007  . COLONIC POLYPS, ADENOMATOUS 08/01/2008, 2013, 2014  . COPD (chronic obstructive pulmonary disease) (HCC)    no per pt  . Depression   . Diabetes type 2, controlled ([place]) 03/13/2007  . Diverticulitis   . ED (erectile dysfunction)   . EMPHYSEMA 11/08/2008   no per pt  . Esophageal cancer ([place]) "dx'd ~ 01/2015"  . GERD 03/13/2007  . GIB (gastrointestinal bleeding) 08/18/2015  . Hiatal hernia   . History of blood transfusion 08/18/2015   due to GIB  . HYPERCHOLESTEROLEMIA 02/03/2008  . HYPERTENSION 03/13/2007   pt denies, claims white coat syndrome  . Prostatism   . PULMONARY NODULE 11/24/2008  . Stomach cancer ([place]) "dx'd ~ 01/2015"  . TOBACCO ABUSE 11/24/2008    Past Surgical History:  Procedure Laterality Date  . BALLOON DILATION N/A 03/19/2016   Procedure: BALLOON DILATION;  Surgeon: Irene Shipper, MD;  Location: [facility];  Service: Endoscopy;  Laterality: N/A;  . CIRCUMCISION    . COLONOSCOPY    . COMPLETE ESOPHAGECTOMY N/A 07/25/2015   Procedure: TRANSHIATAL TOTAL ESOPHAGECTOMY COMPLETE PYLOROMYOTOMY;  Surgeon: [name], MD;   Location: [facility];  Service: Thoracic;  Laterality: N/A;  . EGD  11/19/2005  . ESOPHAGOGASTRODUODENOSCOPY N/A 03/19/2016   Procedure: ESOPHAGOGASTRODUODENOSCOPY (EGD);  Surgeon: Irene Shipper, MD;  Location: [facility];  Service: Endoscopy;  Laterality: N/A;  . ESOPHAGOGASTRODUODENOSCOPY (EGD) WITH PROPOFOL N/A 09/15/2017   Procedure: ESOPHAGOGASTRODUODENOSCOPY (EGD) WITH PROPOFOL;  Surgeon: Irene Shipper, MD;  Location: [facility];  Service: Endoscopy;  Laterality: N/A;  . EUS N/A 03/16/2015   Procedure: UPPER ENDOSCOPIC ULTRASOUND (EUS) RADIAL;  Surgeon: Milus Banister, MD;  Location: [facility];  Service: Endoscopy;  Laterality: N/A;  . FOOT FRACTURE SURGERY Right 1980   right foot w/ pins and screws  . JEJUNOSTOMY N/A 07/25/2015   Procedure: FEEDING JEJUNOSTOMY;  Surgeon: [name], MD;  Location: [facility];  Service: Thoracic;  Laterality: N/A;  . POLYPECTOMY    . removal pins and screws foot  1980   right foot  . UPPER GASTROINTESTINAL ENDOSCOPY    . VASECTOMY    . VIDEO BRONCHOSCOPY N/A 07/25/2015   Procedure: VIDEO BRONCHOSCOPY;  Surgeon: [name], MD;  Location: [facility] OR;  Service: Thoracic;  Laterality: N/A;    Social History   Socioeconomic History  . Marital status: Married    Spouse name: Not on file  . Number of children: 2  . Years of education: Not on file  . Highest education level: Not on file  Social Needs  . Financial resource strain:  Not on file  . Food insecurity - worry: Not on file  . Food insecurity - inability: Not on file  . Transportation needs - medical: Not on file  . Transportation needs - non-medical: Not on file  Occupational History  . Occupation: retired    Fish farm manager: [employer]    Comment: works Youth worker  Tobacco Use  . Smoking status: Former Smoker    Packs/day: 1.00    Years: 35.00    Pack years: 35.00    Types: Cigarettes    Last attempt to quit: 10/31/2008    Years since quitting: 8.9  . Smokeless tobacco:  Never Used  Substance and Sexual Activity  . Alcohol use: No    Alcohol/week: 0.0 oz  . Drug use: No  . Sexual activity: Not Currently    Partners: Female  Other Topics Concern  . 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 atorvastatin (LIPITOR) 10 MG tablet Take 1 tablet (10 mg total) by mouth daily. (Patient taking differently: Take 10 mg by mouth every evening. ) 90 tablet 3  . capecitabine (XELODA) 500 MG tablet Take 4 tablets (2,000 mg total) by mouth 2 (two) times daily after a meal. Take on radiation days only (Mon-Fri). 240 tablet 0  . cetirizine (ZYRTEC) 10 MG tablet Take 10 mg by mouth daily as needed for allergies.     Marland Kitchen glucose blood (PRECISION XTRA TEST STRIPS) test strip Check blood sugar three times a day dx 250.01 270 each 3  . ibuprofen (ADVIL,MOTRIN) 200 MG tablet Take 400 mg by mouth every 6 (six) hours as needed for headache or moderate pain.     . Insulin Syringe-Needle U-100 30G X 3/8" 1 ML MISC Use to inject insulin 3 times daily. 300 each 1  . LORazepam (ATIVAN) 1 MG tablet Take 1-2 tablets (1-2 mg total) by mouth every 8 (eight) hours as needed for anxiety. 60 tablet 0  . metoprolol tartrate (LOPRESSOR) 50 MG tablet Take 50 mg by mouth 2 (two) times daily.    . Multiple Vitamin (MULTIVITAMIN) tablet Take 1 tablet by mouth daily.    Marland Kitchen oxyCODONE 10 MG TABS Take 1 tablet (10 mg total) by mouth every 4 (four) hours as needed for severe pain. 60 tablet 0  . RABEprazole (ACIPHEX) 20 MG tablet TAKE 1 TABLET(20 MG) BY MOUTH TWICE DAILY 60 tablet 3  . sildenafil (REVATIO) 20 MG tablet 1-5 tabs as needed for ED symptoms (Patient taking differently: Take 20-100 mg by mouth as needed (for ED). ) 100 tablet 11  . sucralfate (CARAFATE) 1  g tablet Take 1 tablet (1 g total) by mouth 4 (four) times daily -  with meals and at bedtime. Crush and mix in 1 oz of water to take 120 tablet 1  . topiramate (TOPAMAX) 100 MG tablet Take 1 tablet (100 mg total) by mouth daily. 30 tablet 3  . topiramate (TOPAMAX) 25 MG tablet 1 daily for one week, then 2 daily for one week, then 3 daily for one week, then switch to 100 mg 42 tablet 0  . traMADol (ULTRAM) 50 MG tablet Take 1 tablet (50 mg total) every 6 (six) hours as needed by mouth (headache). (  Patient not taking: Reported on 10/14/2017) 30 tablet 0  . [DISCONTINUED] metoprolol succinate (TOPROL-XL) 25 MG 24 hr tablet Take 25 mg by mouth daily.      Current Facility-Administered Medications on File Prior to Visit  Medication Dose Route Frequency Provider Last Rate Last Dose  . 0.9 %  sodium chloride infusion  500 mL Intravenous Continuous Irene Shipper, MD        No Known Allergies  Family History  Problem Relation Age of Onset  . Stroke Mother   . Diabetes Mother   . Diabetes Maternal Grandmother        mother side of the family aunts, MGF  . Breast cancer Paternal Grandmother   . AAA (abdominal aortic aneurysm) Father   . Colon cancer Neg Hx   . Esophageal cancer Neg Hx   . Rectal cancer Neg Hx   . Stomach cancer Neg Hx     BP 120/60   Pulse (!) 110   Ht '6\' 4"'$  (1.93 m)   Wt 223 lb (101.2 kg)   SpO2 95%   BMI 27.14 kg/m    Review of Systems He has lost 17 lbs.  Depression is well-controlled on lexapro 20/d    Objective:   Physical Exam VITAL SIGNS:  See vs page GENERAL: no distress Pulses: dorsalis pedis intact bilat.   MSK: no deformity of the feet CV: no leg edema Skin:  no ulcer on the feet.  normal color and temp on the feet. Neuro: sensation is intact to touch on the feet    A1c=7.4%    Assessment & Plan:  Insulin-requiring type 2 DM, with PAD: this is the best control this pt should aim for, given this regimen, which does match insulin to her changing  needs throughout the day Renal insuff: in this setting, he does not need a PM insulin dose.  GE junction cancer: he is not a candidate for aggressive glycemic control Weight loss, prob due to cancer recurrence: this has reduced insulin need.  Patient Instructions  Please continue the same medications. Please continue to carefully watch the blood sugar, because your cancer can cause variation in your insulin need.  Please come back for a follow-up appointment in 3 months

## 2017-10-15 ENCOUNTER — Ambulatory Visit: Payer: Medicare Other

## 2017-10-15 ENCOUNTER — Ambulatory Visit
Admission: RE | Admit: 2017-10-15 | Discharge: 2017-10-15 | Disposition: A | Discharge status: Home / Self Care | Payer: Medicare Other | Source: Ambulatory Visit | Attending: Radiation Oncology | Admitting: Radiation Oncology

## 2017-10-15 DIAGNOSIS — C153 Malignant neoplasm of upper third of esophagus: Secondary | ICD-10-CM | POA: Diagnosis not present

## 2017-10-15 DIAGNOSIS — D649 Anemia, unspecified: Secondary | ICD-10-CM | POA: Diagnosis not present

## 2017-10-15 DIAGNOSIS — C770 Secondary and unspecified malignant neoplasm of lymph nodes of head, face and neck: Secondary | ICD-10-CM | POA: Diagnosis not present

## 2017-10-15 DIAGNOSIS — J449 Chronic obstructive pulmonary disease, unspecified: Secondary | ICD-10-CM | POA: Diagnosis not present

## 2017-10-15 DIAGNOSIS — F418 Other specified anxiety disorders: Secondary | ICD-10-CM | POA: Diagnosis not present

## 2017-10-15 DIAGNOSIS — F419 Anxiety disorder, unspecified: Secondary | ICD-10-CM | POA: Diagnosis not present

## 2017-10-15 DIAGNOSIS — Z510 Encounter for antineoplastic radiation therapy: Secondary | ICD-10-CM | POA: Diagnosis not present

## 2017-10-16 ENCOUNTER — Ambulatory Visit: Payer: Medicare Other

## 2017-10-16 ENCOUNTER — Ambulatory Visit
Admission: RE | Admit: 2017-10-16 | Discharge: 2017-10-16 | Disposition: A | Discharge status: Home / Self Care | Payer: Medicare Other | Source: Ambulatory Visit | Attending: Radiation Oncology | Admitting: Radiation Oncology

## 2017-10-16 DIAGNOSIS — Z510 Encounter for antineoplastic radiation therapy: Secondary | ICD-10-CM | POA: Diagnosis not present

## 2017-10-16 DIAGNOSIS — J449 Chronic obstructive pulmonary disease, unspecified: Secondary | ICD-10-CM | POA: Diagnosis not present

## 2017-10-16 DIAGNOSIS — F418 Other specified anxiety disorders: Secondary | ICD-10-CM | POA: Diagnosis not present

## 2017-10-16 DIAGNOSIS — F419 Anxiety disorder, unspecified: Secondary | ICD-10-CM | POA: Diagnosis not present

## 2017-10-16 DIAGNOSIS — C153 Malignant neoplasm of upper third of esophagus: Secondary | ICD-10-CM | POA: Diagnosis not present

## 2017-10-16 DIAGNOSIS — C770 Secondary and unspecified malignant neoplasm of lymph nodes of head, face and neck: Secondary | ICD-10-CM | POA: Diagnosis not present

## 2017-10-16 DIAGNOSIS — D649 Anemia, unspecified: Secondary | ICD-10-CM | POA: Diagnosis not present

## 2017-10-16 MED ORDER — INSULIN NPH ISOPHANE & REGULAR (70-30) 100 UNIT/ML ~~LOC~~ SUSP: 100.0000 [IU] | Freq: Every day | SUBCUTANEOUS | 11 refills | Status: DC

## 2017-10-16 MED FILL — CAPECITABINE 500 MG TABLET: 500 | 14 days supply | Qty: 80 | Fill #1

## 2017-10-17 ENCOUNTER — Ambulatory Visit
Admission: RE | Admit: 2017-10-17 | Discharge: 2017-10-17 | Disposition: A | Discharge status: Home / Self Care | Payer: Medicare Other | Source: Ambulatory Visit | Attending: Radiation Oncology | Admitting: Radiation Oncology

## 2017-10-17 ENCOUNTER — Ambulatory Visit: Payer: Medicare Other

## 2017-10-17 DIAGNOSIS — Z86711 Personal history of pulmonary embolism: Secondary | ICD-10-CM | POA: Diagnosis not present

## 2017-10-17 DIAGNOSIS — R1310 Dysphagia, unspecified: Secondary | ICD-10-CM | POA: Insufficient documentation

## 2017-10-17 DIAGNOSIS — Z510 Encounter for antineoplastic radiation therapy: Secondary | ICD-10-CM | POA: Insufficient documentation

## 2017-10-17 DIAGNOSIS — J449 Chronic obstructive pulmonary disease, unspecified: Secondary | ICD-10-CM | POA: Diagnosis not present

## 2017-10-17 DIAGNOSIS — F329 Major depressive disorder, single episode, unspecified: Secondary | ICD-10-CM | POA: Diagnosis not present

## 2017-10-17 DIAGNOSIS — Z86010 Personal history of colonic polyps: Secondary | ICD-10-CM | POA: Diagnosis not present

## 2017-10-17 DIAGNOSIS — E119 Type 2 diabetes mellitus without complications: Secondary | ICD-10-CM | POA: Diagnosis not present

## 2017-10-17 DIAGNOSIS — C153 Malignant neoplasm of upper third of esophagus: Secondary | ICD-10-CM | POA: Diagnosis not present

## 2017-10-17 DIAGNOSIS — C159 Malignant neoplasm of esophagus, unspecified: Secondary | ICD-10-CM | POA: Insufficient documentation

## 2017-10-17 DIAGNOSIS — C770 Secondary and unspecified malignant neoplasm of lymph nodes of head, face and neck: Secondary | ICD-10-CM | POA: Diagnosis not present

## 2017-10-17 DIAGNOSIS — Z79899 Other long term (current) drug therapy: Secondary | ICD-10-CM | POA: Diagnosis not present

## 2017-10-17 MED ORDER — SONAFINE EX EMUL: 1.0000 "application " | Freq: Two times a day (BID) | CUTANEOUS | Status: DC

## 2017-10-20 ENCOUNTER — Ambulatory Visit: Payer: Medicare Other

## 2017-10-20 ENCOUNTER — Ambulatory Visit
Admission: RE | Admit: 2017-10-20 | Discharge: 2017-10-20 | Disposition: A | Discharge status: Home / Self Care | Payer: Medicare Other | Source: Ambulatory Visit | Attending: Radiation Oncology | Admitting: Radiation Oncology

## 2017-10-20 ENCOUNTER — Encounter: Payer: Self-pay | Admitting: Radiation Oncology

## 2017-10-20 DIAGNOSIS — E119 Type 2 diabetes mellitus without complications: Secondary | ICD-10-CM | POA: Diagnosis not present

## 2017-10-20 DIAGNOSIS — C153 Malignant neoplasm of upper third of esophagus: Secondary | ICD-10-CM | POA: Diagnosis not present

## 2017-10-20 DIAGNOSIS — Z79899 Other long term (current) drug therapy: Secondary | ICD-10-CM | POA: Diagnosis not present

## 2017-10-20 DIAGNOSIS — Z510 Encounter for antineoplastic radiation therapy: Secondary | ICD-10-CM | POA: Diagnosis not present

## 2017-10-20 DIAGNOSIS — C770 Secondary and unspecified malignant neoplasm of lymph nodes of head, face and neck: Secondary | ICD-10-CM | POA: Diagnosis not present

## 2017-10-20 DIAGNOSIS — J449 Chronic obstructive pulmonary disease, unspecified: Secondary | ICD-10-CM | POA: Diagnosis not present

## 2017-10-20 DIAGNOSIS — C159 Malignant neoplasm of esophagus, unspecified: Secondary | ICD-10-CM | POA: Diagnosis not present

## 2017-10-20 DIAGNOSIS — F329 Major depressive disorder, single episode, unspecified: Secondary | ICD-10-CM | POA: Diagnosis not present

## 2017-10-20 MED ORDER — SILVER SULFADIAZINE 1 % EX CREA: TOPICAL_CREAM | Freq: Two times a day (BID) | CUTANEOUS | Status: DC

## 2017-10-20 MED ORDER — SILVER SULFADIAZINE 1 % EX CREA
TOPICAL_CREAM | Freq: Once | CUTANEOUS | Status: AC
  Administered 2017-10-20: 11:00:00 via TOPICAL

## 2017-10-20 NOTE — Progress Notes (Signed)
The patient was seen briefly today for a work in visit alongside his undertreatment schedule.  He has proceeded with approximately 20 fractions of radiotherapy and has noticed increased skin irritation that he experienced when he received this prior to the same site.  He did have increased erythema and dry desquamation, and has been using sonafine cream.  He also has some papules extending to the left arm.  He was given Silvadene to use on his skin, and encouraged to use hydrocortisone.  We did discuss that if his symptoms progressed, we would recommend a course of steroids, he will keep me informed of his progress, and also understands that if he were to acutely become short of breath, that he would need to be seen in the emergency setting.  He is in agreement with this plan and we will continue to follow this closely along the course of his treatment.

## 2017-10-21 ENCOUNTER — Ambulatory Visit: Payer: Medicare Other

## 2017-10-21 ENCOUNTER — Ambulatory Visit
Admission: RE | Admit: 2017-10-21 | Discharge: 2017-10-21 | Disposition: A | Discharge status: Home / Self Care | Payer: Medicare Other | Source: Ambulatory Visit | Attending: Radiation Oncology | Admitting: Radiation Oncology

## 2017-10-21 DIAGNOSIS — Z510 Encounter for antineoplastic radiation therapy: Secondary | ICD-10-CM | POA: Diagnosis not present

## 2017-10-21 DIAGNOSIS — C153 Malignant neoplasm of upper third of esophagus: Secondary | ICD-10-CM | POA: Diagnosis not present

## 2017-10-21 DIAGNOSIS — C770 Secondary and unspecified malignant neoplasm of lymph nodes of head, face and neck: Secondary | ICD-10-CM | POA: Diagnosis not present

## 2017-10-21 DIAGNOSIS — Z79899 Other long term (current) drug therapy: Secondary | ICD-10-CM | POA: Diagnosis not present

## 2017-10-21 DIAGNOSIS — E119 Type 2 diabetes mellitus without complications: Secondary | ICD-10-CM | POA: Diagnosis not present

## 2017-10-21 DIAGNOSIS — C159 Malignant neoplasm of esophagus, unspecified: Secondary | ICD-10-CM | POA: Diagnosis not present

## 2017-10-21 DIAGNOSIS — J449 Chronic obstructive pulmonary disease, unspecified: Secondary | ICD-10-CM | POA: Diagnosis not present

## 2017-10-21 DIAGNOSIS — F329 Major depressive disorder, single episode, unspecified: Secondary | ICD-10-CM | POA: Diagnosis not present

## 2017-10-22 ENCOUNTER — Other Ambulatory Visit: Payer: Self-pay | Admitting: Emergency Medicine

## 2017-10-22 ENCOUNTER — Ambulatory Visit
Admission: RE | Admit: 2017-10-22 | Discharge: 2017-10-22 | Disposition: A | Discharge status: Home / Self Care | Payer: Medicare Other | Source: Ambulatory Visit | Attending: Radiation Oncology | Admitting: Radiation Oncology

## 2017-10-22 ENCOUNTER — Encounter: Payer: Self-pay | Admitting: Nurse Practitioner

## 2017-10-22 ENCOUNTER — Telehealth: Payer: Self-pay | Admitting: Nurse Practitioner

## 2017-10-22 ENCOUNTER — Inpatient Hospital Stay: Payer: Medicare Other

## 2017-10-22 ENCOUNTER — Inpatient Hospital Stay: Payer: Medicare Other | Attending: Nurse Practitioner | Admitting: Nurse Practitioner

## 2017-10-22 VITALS — BP 92/58 | HR 94 | Temp 98.3°F | Resp 18 | Ht 76.0 in | Wt 219.1 lb

## 2017-10-22 DIAGNOSIS — J449 Chronic obstructive pulmonary disease, unspecified: Secondary | ICD-10-CM | POA: Insufficient documentation

## 2017-10-22 DIAGNOSIS — F329 Major depressive disorder, single episode, unspecified: Secondary | ICD-10-CM | POA: Diagnosis not present

## 2017-10-22 DIAGNOSIS — C159 Malignant neoplasm of esophagus, unspecified: Secondary | ICD-10-CM

## 2017-10-22 DIAGNOSIS — I10 Essential (primary) hypertension: Secondary | ICD-10-CM | POA: Diagnosis not present

## 2017-10-22 DIAGNOSIS — C160 Malignant neoplasm of cardia: Secondary | ICD-10-CM

## 2017-10-22 DIAGNOSIS — E1165 Type 2 diabetes mellitus with hyperglycemia: Secondary | ICD-10-CM | POA: Insufficient documentation

## 2017-10-22 DIAGNOSIS — I959 Hypotension, unspecified: Secondary | ICD-10-CM | POA: Diagnosis not present

## 2017-10-22 DIAGNOSIS — E119 Type 2 diabetes mellitus without complications: Secondary | ICD-10-CM | POA: Diagnosis not present

## 2017-10-22 DIAGNOSIS — D696 Thrombocytopenia, unspecified: Secondary | ICD-10-CM | POA: Diagnosis not present

## 2017-10-22 DIAGNOSIS — Z79899 Other long term (current) drug therapy: Secondary | ICD-10-CM | POA: Diagnosis not present

## 2017-10-22 DIAGNOSIS — C153 Malignant neoplasm of upper third of esophagus: Secondary | ICD-10-CM | POA: Diagnosis not present

## 2017-10-22 DIAGNOSIS — Z86010 Personal history of colonic polyps: Secondary | ICD-10-CM | POA: Insufficient documentation

## 2017-10-22 DIAGNOSIS — R1310 Dysphagia, unspecified: Secondary | ICD-10-CM | POA: Insufficient documentation

## 2017-10-22 DIAGNOSIS — Z923 Personal history of irradiation: Secondary | ICD-10-CM | POA: Diagnosis not present

## 2017-10-22 DIAGNOSIS — L598 Other specified disorders of the skin and subcutaneous tissue related to radiation: Secondary | ICD-10-CM | POA: Diagnosis not present

## 2017-10-22 DIAGNOSIS — C770 Secondary and unspecified malignant neoplasm of lymph nodes of head, face and neck: Secondary | ICD-10-CM | POA: Diagnosis not present

## 2017-10-22 DIAGNOSIS — Z510 Encounter for antineoplastic radiation therapy: Secondary | ICD-10-CM | POA: Diagnosis not present

## 2017-10-22 LAB — CBC WITH DIFFERENTIAL (CANCER CENTER ONLY)
Basophils Absolute: 0 10*3/uL (ref 0.0–0.1)
Basophils Relative: 0 %
Eosinophils Absolute: 0.2 10*3/uL (ref 0.0–0.5)
Eosinophils Relative: 4 %
HEMATOCRIT: 40.6 % (ref 38.4–49.9)
HEMOGLOBIN: 13.5 g/dL (ref 13.0–17.1)
LYMPHS ABS: 1.3 10*3/uL (ref 0.9–3.3)
LYMPHS PCT: 27 %
MCH: 32.6 pg (ref 27.2–33.4)
MCHC: 33.3 g/dL (ref 32.0–36.0)
MCV: 98.1 fL — ABNORMAL HIGH (ref 79.3–98.0)
MONO ABS: 0.3 10*3/uL (ref 0.1–0.9)
MONOS PCT: 7 %
NEUTROS ABS: 3.1 10*3/uL (ref 1.5–6.5)
NEUTROS PCT: 62 %
Platelet Count: 153 10*3/uL (ref 140–400)
RBC: 4.14 MIL/uL — ABNORMAL LOW (ref 4.20–5.82)
RDW: 15.2 % — AB (ref 11.0–14.6)
WBC Count: 5 10*3/uL (ref 4.0–10.3)

## 2017-10-22 LAB — CMP (CANCER CENTER ONLY)
ALBUMIN: 4.1 g/dL (ref 3.5–5.0)
ALK PHOS: 91 U/L (ref 40–150)
ALT: 11 U/L (ref 0–55)
ANION GAP: 11 (ref 3–11)
AST: 18 U/L (ref 5–34)
BUN: 20 mg/dL (ref 7–26)
CALCIUM: 10 mg/dL (ref 8.4–10.4)
CO2: 25 mmol/L (ref 22–29)
Chloride: 104 mmol/L (ref 98–109)
Creatinine: 1.38 mg/dL — ABNORMAL HIGH (ref 0.70–1.30)
GFR, EST AFRICAN AMERICAN: 59 mL/min — AB (ref >60)
GFR, Estimated: 51 mL/min — ABNORMAL LOW (ref >60)
GLUCOSE: 124 mg/dL (ref 70–140)
Potassium: 4 mmol/L (ref 3.5–5.1)
SODIUM: 140 mmol/L (ref 136–145)
Total Bilirubin: 0.6 mg/dL (ref 0.2–1.2)
Total Protein: 8.3 g/dL (ref 6.4–8.3)

## 2017-10-22 MED ORDER — SODIUM CHLORIDE 0.9 % IV SOLN: Freq: Once | INTRAVENOUS | Status: DC

## 2017-10-22 MED ORDER — SODIUM CHLORIDE 0.9 % IV SOLN
INTRAVENOUS | Status: AC
  Administered 2017-10-22 (×2): via INTRAVENOUS

## 2017-10-22 NOTE — Addendum Note (Signed)
Addended by: Hughie Closs on: 10/22/2017 01:01 PM   Modules accepted: Orders

## 2017-10-22 NOTE — Telephone Encounter (Signed)
Scheduled appt per 3/6 los - unable to reach patient - left message with appt date and time.

## 2017-10-22 NOTE — Progress Notes (Signed)
[facility] OFFICE PROGRESS NOTE   Diagnosis: Esophagus cancer  INTERVAL HISTORY:   Ronald Johnson returns as scheduled.  He continues radiation and Xeloda.  He denies nausea/vomiting.  No mouth sores.  He notes that his mouth feels "irritated".  No diarrhea.  No hand or foot pain or redness.  He is tolerating liquids and soft solids.  He feels oral intake is adequate.  No lightheadedness or dizziness.  He does not feel weak.  No fever.  No shaking chills.  He denies bleeding.  He has a "skin rash" at the upper chest and back related to radiation.  His wife thought he was a little "wobbly" yesterday.  Objective:  Vital signs in last 24 hours:  Blood pressure (!) 86/49, pulse 94, temperature 98.3 F (36.8 C), temperature source Oral, resp. rate 18, height 6\' 4"  (1.93 m), weight 219 lb 1.6 oz (99.4 kg), SpO2 100 %.    HEENT: No thrush or ulcers.  Mucous membranes appear moist. Resp: Distant breath sounds.  No respiratory distress. Cardio: Regular rate and rhythm. GI: Abdomen soft and nontender.  No hepatomegaly. Vascular: No leg edema. Neuro: Alert and oriented. Skin: Mild decrease in skin turgor.  Palms without erythema.  Radiation dermatitis with erythema/dry desquamation anterior chest greater than upper back.   Lab Results:  Lab Results  Component Value Date   WBC 4.1 10/13/2017   HGB 13.2 08/26/2017   HCT 38.8 10/13/2017   MCV 96.3 10/13/2017   PLT 134 (L) 10/13/2017   NEUTROABS 2.6 10/13/2017    Imaging:  No results found.  Medications: I have reviewed the patient's current medications.  Assessment/Plan: 1. 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 EUS 03/16/2015 confirmed a clinical stage IIb (uT3,uN1) tumor ? Staging CTs of the chest, abdomen, and pelvis with no evidence of metastatic disease ? Initiation of radiation 04/19/2015, cycle 1 Taxol/carboplatin 04/20/2015: Radiation completed 05/29/2015; the fifth  and final week of Taxol/carboplatin 05/18/2015 ? Restaging CTs 07/20/2015 revealed no evidence of metastatic disease, incidental left lower lobe pulmonary embolism ? Transhiatal total esophagectomy 07/25/2015 confirmed a ypT3, ypN2 with 4/5 positive lymph nodes, lymphovascular invasion, perineural invasion, negative resection margins ? CT neck 07/25/2017-4-5 cm mass in the right lower neck upper mediastinum immediately adjacent to the esophageal anastomosis. 12 mm mass within the right parotid enlarged since the previous PET study where it was about half that size. Question few small pulmonary nodules. Borderline enlarged mediastinal lymph nodes. ? CTs brain/chest/abdomen/pelvis 08/06/2017-ill-defined mass at the right thoracic inlet extending into the right superior mediastinum; new bilateral but right much greater than left peripheral reticulonodular and groundglass opacities; scattered small paratracheal and anterior mediastinal lymph nodes; small bilateral axillary lymph nodes. Brain CT with no evidence of metastasis. ? 08/26/2017 biopsy right supraclavicular lymph node-very scant fragment of benign lymph node. ? PET scan 09/11/2017-poorly marginated heterogeneously hypermetabolic soft tissue mass at the right thoracic inlet extending laterally from the esophagogastric anastomosis in the neck. No definite hypermetabolic metastatic disease.Patchy tree-in-bud opacities throughout the lungs significantly decreased. Persistent scattered subcentimeter solid pulmonary nodules in both lungs stable to decreased. Stable irregular 1.7 cm nodular bandlike opacity anterior left lower lobe with associated low-level metabolism. Nonspecific mildly hypermetabolic subcarinal and bilateral hilar lymph nodes. Nonspecific mildly hypermetabolic nodular focus of soft tissue in the upper left retroperitoneum anterior to the adrenal gland. ? Upper endoscopy 09/15/2017-at 20 cm from the incisors tight stricture at the  anastomosis measuring only a few millimeters in diameter.  Surrounding tissues firm, slightly friable, noncompliant. Stricture was dilated with a 15 mm balloon several times. Diameter of stricture improved but still would not permit passage of a standard upper endoscope. Friable areas biopsied-invasive adenocarcinoma. ? Initiation of radiation and concurrent capecitabine 09/22/2017  2. Postprandial subxiphoid pain secondary to #1  3. Diabetes-elevated blood sugar readings at the [facility] and at home  4. COPD  5. Depression-Improved with Wellbutrin  6. Hypertension  7. History of adenomatous colon polyps  8. History of diabetic related neuropathy, peripheral arterial disease, and nephropathy  9. History of nausea-likely related to radiation esophagitis/gastritis  10. Incidental pulmonary embolism noted on the CT 07/20/2015, treated with Xarelto  11. Admission 08/18/2015 with GI bleeding while on Xarelto, anticoagulation therapy discontinued  12. Dysphagia-improved with esophageal dilatation procedures by Dr. Henrene Pastor, progressive dysphagia secondary to local recurrence of esophagus cancer with obstruction at the esophagus anastomosis  13.History ofMild thrombocytopenia  14. CT scan abdomen/pelvis 10/09/2016 (done to evaluate persistent nausea, epigastric pain)-no acute findings or explanation for the patient's symptoms. Resolving postsurgical changes from presumed previous esophagectomy and gastric pull-through. No evidence of metastatic disease.    Disposition: Ronald Johnson appears stable.  He continues radiation/Xeloda.  He seems to be tolerating the Xeloda well.    He has developed radiation dermatitis.  This is being followed/managed by radiation oncology.  He is hypotensive in the office today.  This is likely due to a combination of weight loss, dehydration.  He will receive a liter of IV fluids.  We will obtain repeat vital signs after 500 cc  to ensure the blood pressure responds.  We will also obtain labs today to include a CBC and chemistry panel.  He will return for a follow-up visit on 10/31/2017.  He will contact the office in the interim with any problems.  He will work on increasing oral intake.  Plan reviewed with Dr. [name].  25 minutes were spent face-to-face at today's visit with the majority of that time involved in counseling/coordination of care.    Ned Card ANP/GNP-BC   10/22/2017  9:25 AM

## 2017-10-23 ENCOUNTER — Ambulatory Visit
Admission: RE | Admit: 2017-10-23 | Discharge: 2017-10-23 | Disposition: A | Discharge status: Home / Self Care | Payer: Medicare Other | Source: Ambulatory Visit | Attending: Radiation Oncology | Admitting: Radiation Oncology

## 2017-10-23 DIAGNOSIS — E119 Type 2 diabetes mellitus without complications: Secondary | ICD-10-CM | POA: Diagnosis not present

## 2017-10-23 DIAGNOSIS — J449 Chronic obstructive pulmonary disease, unspecified: Secondary | ICD-10-CM | POA: Diagnosis not present

## 2017-10-23 DIAGNOSIS — C153 Malignant neoplasm of upper third of esophagus: Secondary | ICD-10-CM | POA: Diagnosis not present

## 2017-10-23 DIAGNOSIS — Z510 Encounter for antineoplastic radiation therapy: Secondary | ICD-10-CM | POA: Diagnosis not present

## 2017-10-23 DIAGNOSIS — C159 Malignant neoplasm of esophagus, unspecified: Secondary | ICD-10-CM | POA: Diagnosis not present

## 2017-10-23 DIAGNOSIS — Z79899 Other long term (current) drug therapy: Secondary | ICD-10-CM | POA: Diagnosis not present

## 2017-10-23 DIAGNOSIS — C770 Secondary and unspecified malignant neoplasm of lymph nodes of head, face and neck: Secondary | ICD-10-CM | POA: Diagnosis not present

## 2017-10-23 DIAGNOSIS — F329 Major depressive disorder, single episode, unspecified: Secondary | ICD-10-CM | POA: Diagnosis not present

## 2017-10-24 ENCOUNTER — Ambulatory Visit
Admission: RE | Admit: 2017-10-24 | Discharge: 2017-10-24 | Disposition: A | Discharge status: Home / Self Care | Payer: Medicare Other | Source: Ambulatory Visit | Attending: Radiation Oncology | Admitting: Radiation Oncology

## 2017-10-24 DIAGNOSIS — J449 Chronic obstructive pulmonary disease, unspecified: Secondary | ICD-10-CM | POA: Diagnosis not present

## 2017-10-24 DIAGNOSIS — C153 Malignant neoplasm of upper third of esophagus: Secondary | ICD-10-CM | POA: Diagnosis not present

## 2017-10-24 DIAGNOSIS — C770 Secondary and unspecified malignant neoplasm of lymph nodes of head, face and neck: Secondary | ICD-10-CM | POA: Diagnosis not present

## 2017-10-24 DIAGNOSIS — E119 Type 2 diabetes mellitus without complications: Secondary | ICD-10-CM | POA: Diagnosis not present

## 2017-10-24 DIAGNOSIS — C159 Malignant neoplasm of esophagus, unspecified: Secondary | ICD-10-CM | POA: Diagnosis not present

## 2017-10-24 DIAGNOSIS — Z79899 Other long term (current) drug therapy: Secondary | ICD-10-CM | POA: Diagnosis not present

## 2017-10-24 DIAGNOSIS — Z510 Encounter for antineoplastic radiation therapy: Secondary | ICD-10-CM | POA: Diagnosis not present

## 2017-10-24 DIAGNOSIS — F329 Major depressive disorder, single episode, unspecified: Secondary | ICD-10-CM | POA: Diagnosis not present

## 2017-10-27 ENCOUNTER — Other Ambulatory Visit: Payer: Self-pay | Admitting: Radiation Oncology

## 2017-10-27 ENCOUNTER — Ambulatory Visit
Admission: RE | Admit: 2017-10-27 | Discharge: 2017-10-27 | Disposition: A | Discharge status: Home / Self Care | Payer: Medicare Other | Source: Ambulatory Visit | Attending: Radiation Oncology | Admitting: Radiation Oncology

## 2017-10-27 DIAGNOSIS — E119 Type 2 diabetes mellitus without complications: Secondary | ICD-10-CM | POA: Diagnosis not present

## 2017-10-27 DIAGNOSIS — C159 Malignant neoplasm of esophagus, unspecified: Secondary | ICD-10-CM | POA: Diagnosis not present

## 2017-10-27 DIAGNOSIS — C770 Secondary and unspecified malignant neoplasm of lymph nodes of head, face and neck: Secondary | ICD-10-CM | POA: Diagnosis not present

## 2017-10-27 DIAGNOSIS — J449 Chronic obstructive pulmonary disease, unspecified: Secondary | ICD-10-CM | POA: Diagnosis not present

## 2017-10-27 DIAGNOSIS — F329 Major depressive disorder, single episode, unspecified: Secondary | ICD-10-CM | POA: Diagnosis not present

## 2017-10-27 DIAGNOSIS — Z79899 Other long term (current) drug therapy: Secondary | ICD-10-CM | POA: Diagnosis not present

## 2017-10-27 DIAGNOSIS — C153 Malignant neoplasm of upper third of esophagus: Secondary | ICD-10-CM | POA: Diagnosis not present

## 2017-10-27 DIAGNOSIS — Z510 Encounter for antineoplastic radiation therapy: Secondary | ICD-10-CM | POA: Diagnosis not present

## 2017-10-27 MED ORDER — OXYCODONE HCL 10 MG PO TABS: 10.0000 mg | ORAL_TABLET | ORAL | 0 refills | Status: DC | PRN

## 2017-10-28 ENCOUNTER — Ambulatory Visit
Admission: RE | Admit: 2017-10-28 | Discharge: 2017-10-28 | Disposition: A | Discharge status: Home / Self Care | Payer: Medicare Other | Source: Ambulatory Visit | Attending: Neurology | Admitting: Neurology

## 2017-10-28 ENCOUNTER — Ambulatory Visit
Admission: RE | Admit: 2017-10-28 | Discharge: 2017-10-28 | Disposition: A | Discharge status: Home / Self Care | Payer: Medicare Other | Source: Ambulatory Visit | Attending: Radiation Oncology | Admitting: Radiation Oncology

## 2017-10-28 DIAGNOSIS — H02402 Unspecified ptosis of left eyelid: Secondary | ICD-10-CM

## 2017-10-28 DIAGNOSIS — C770 Secondary and unspecified malignant neoplasm of lymph nodes of head, face and neck: Secondary | ICD-10-CM | POA: Diagnosis not present

## 2017-10-28 DIAGNOSIS — C153 Malignant neoplasm of upper third of esophagus: Secondary | ICD-10-CM | POA: Diagnosis not present

## 2017-10-28 DIAGNOSIS — Z79899 Other long term (current) drug therapy: Secondary | ICD-10-CM | POA: Diagnosis not present

## 2017-10-28 DIAGNOSIS — G902 Horner's syndrome: Secondary | ICD-10-CM

## 2017-10-28 DIAGNOSIS — C159 Malignant neoplasm of esophagus, unspecified: Secondary | ICD-10-CM

## 2017-10-28 DIAGNOSIS — J449 Chronic obstructive pulmonary disease, unspecified: Secondary | ICD-10-CM | POA: Diagnosis not present

## 2017-10-28 DIAGNOSIS — E119 Type 2 diabetes mellitus without complications: Secondary | ICD-10-CM | POA: Diagnosis not present

## 2017-10-28 DIAGNOSIS — R519 Headache, unspecified: Secondary | ICD-10-CM

## 2017-10-28 DIAGNOSIS — F329 Major depressive disorder, single episode, unspecified: Secondary | ICD-10-CM | POA: Diagnosis not present

## 2017-10-28 DIAGNOSIS — R51 Headache: Principal | ICD-10-CM

## 2017-10-28 DIAGNOSIS — Z510 Encounter for antineoplastic radiation therapy: Secondary | ICD-10-CM | POA: Diagnosis not present

## 2017-10-29 ENCOUNTER — Ambulatory Visit
Admission: RE | Admit: 2017-10-29 | Discharge: 2017-10-29 | Disposition: A | Discharge status: Home / Self Care | Payer: Medicare Other | Source: Ambulatory Visit | Attending: Radiation Oncology | Admitting: Radiation Oncology

## 2017-10-29 DIAGNOSIS — C153 Malignant neoplasm of upper third of esophagus: Secondary | ICD-10-CM | POA: Diagnosis not present

## 2017-10-29 DIAGNOSIS — F329 Major depressive disorder, single episode, unspecified: Secondary | ICD-10-CM | POA: Diagnosis not present

## 2017-10-29 DIAGNOSIS — C770 Secondary and unspecified malignant neoplasm of lymph nodes of head, face and neck: Secondary | ICD-10-CM | POA: Diagnosis not present

## 2017-10-29 DIAGNOSIS — C159 Malignant neoplasm of esophagus, unspecified: Secondary | ICD-10-CM | POA: Diagnosis not present

## 2017-10-29 DIAGNOSIS — Z79899 Other long term (current) drug therapy: Secondary | ICD-10-CM | POA: Diagnosis not present

## 2017-10-29 DIAGNOSIS — E119 Type 2 diabetes mellitus without complications: Secondary | ICD-10-CM | POA: Diagnosis not present

## 2017-10-29 DIAGNOSIS — J449 Chronic obstructive pulmonary disease, unspecified: Secondary | ICD-10-CM | POA: Diagnosis not present

## 2017-10-29 DIAGNOSIS — Z510 Encounter for antineoplastic radiation therapy: Secondary | ICD-10-CM | POA: Diagnosis not present

## 2017-10-30 ENCOUNTER — Ambulatory Visit
Admission: RE | Admit: 2017-10-30 | Discharge: 2017-10-30 | Disposition: A | Discharge status: Home / Self Care | Payer: Medicare Other | Source: Ambulatory Visit | Attending: Radiation Oncology | Admitting: Radiation Oncology

## 2017-10-30 ENCOUNTER — Encounter: Payer: Self-pay | Admitting: Radiation Oncology

## 2017-10-30 ENCOUNTER — Other Ambulatory Visit: Payer: Self-pay | Admitting: Internal Medicine

## 2017-10-30 DIAGNOSIS — Z79899 Other long term (current) drug therapy: Secondary | ICD-10-CM | POA: Diagnosis not present

## 2017-10-30 DIAGNOSIS — C153 Malignant neoplasm of upper third of esophagus: Secondary | ICD-10-CM | POA: Diagnosis not present

## 2017-10-30 DIAGNOSIS — C770 Secondary and unspecified malignant neoplasm of lymph nodes of head, face and neck: Secondary | ICD-10-CM | POA: Diagnosis not present

## 2017-10-30 DIAGNOSIS — F329 Major depressive disorder, single episode, unspecified: Secondary | ICD-10-CM | POA: Diagnosis not present

## 2017-10-30 DIAGNOSIS — Z510 Encounter for antineoplastic radiation therapy: Secondary | ICD-10-CM | POA: Diagnosis not present

## 2017-10-30 DIAGNOSIS — J449 Chronic obstructive pulmonary disease, unspecified: Secondary | ICD-10-CM | POA: Diagnosis not present

## 2017-10-30 DIAGNOSIS — E119 Type 2 diabetes mellitus without complications: Secondary | ICD-10-CM | POA: Diagnosis not present

## 2017-10-30 DIAGNOSIS — C159 Malignant neoplasm of esophagus, unspecified: Secondary | ICD-10-CM | POA: Diagnosis not present

## 2017-10-31 ENCOUNTER — Encounter: Payer: Self-pay | Admitting: Nurse Practitioner

## 2017-10-31 ENCOUNTER — Ambulatory Visit: Payer: Medicare Other

## 2017-10-31 ENCOUNTER — Inpatient Hospital Stay (HOSPITAL_BASED_OUTPATIENT_CLINIC_OR_DEPARTMENT_OTHER): Payer: Medicare Other | Admitting: Nurse Practitioner

## 2017-10-31 VITALS — BP 117/59 | HR 103 | Temp 98.2°F | Resp 17 | Ht 76.0 in | Wt 214.7 lb

## 2017-10-31 DIAGNOSIS — C159 Malignant neoplasm of esophagus, unspecified: Secondary | ICD-10-CM

## 2017-10-31 DIAGNOSIS — D696 Thrombocytopenia, unspecified: Secondary | ICD-10-CM | POA: Diagnosis not present

## 2017-10-31 DIAGNOSIS — Z79899 Other long term (current) drug therapy: Secondary | ICD-10-CM

## 2017-10-31 DIAGNOSIS — I959 Hypotension, unspecified: Secondary | ICD-10-CM | POA: Diagnosis not present

## 2017-10-31 DIAGNOSIS — E1165 Type 2 diabetes mellitus with hyperglycemia: Secondary | ICD-10-CM | POA: Diagnosis not present

## 2017-10-31 DIAGNOSIS — L598 Other specified disorders of the skin and subcutaneous tissue related to radiation: Secondary | ICD-10-CM

## 2017-10-31 DIAGNOSIS — I10 Essential (primary) hypertension: Secondary | ICD-10-CM

## 2017-10-31 DIAGNOSIS — J449 Chronic obstructive pulmonary disease, unspecified: Secondary | ICD-10-CM | POA: Diagnosis not present

## 2017-10-31 DIAGNOSIS — Z923 Personal history of irradiation: Secondary | ICD-10-CM | POA: Diagnosis not present

## 2017-10-31 DIAGNOSIS — R1310 Dysphagia, unspecified: Secondary | ICD-10-CM | POA: Diagnosis not present

## 2017-10-31 DIAGNOSIS — Z86010 Personal history of colonic polyps: Secondary | ICD-10-CM | POA: Diagnosis not present

## 2017-10-31 DIAGNOSIS — C160 Malignant neoplasm of cardia: Secondary | ICD-10-CM | POA: Diagnosis not present

## 2017-10-31 NOTE — Progress Notes (Addendum)
[place] OFFICE PROGRESS NOTE   Diagnosis: Esophagus cancer  INTERVAL HISTORY:   Ronald Johnson returns as scheduled.  He completed the course of radiation and Xeloda yesterday.  He denies nausea/vomiting.  No mouth sores.  No diarrhea.  He has stable dysphagia.  He also reports odynophagia.  He is tolerating liquids without difficulty.  He notes a painful rash at the upper chest and upper back.  Earlier this week he noted a rash on the forearms.  Objective:  Vital signs in last 24 hours:  Blood pressure (!) 117/59, pulse (!) 103, temperature 98.2 F (36.8 C), temperature source Oral, resp. rate 17, height 6\' 4"  (1.93 m), weight 214 lb 11.2 oz (97.4 kg), SpO2 97 %.    HEENT: No thrush or ulcers. Resp: Lungs clear bilaterally. Cardio: Regular rate and rhythm. GI: Abdomen soft and nontender.  No hepatomegaly. Vascular: No leg edema. Skin: Palms without erythema.  Erythematous rash scattered over the forearms.  Erythema/desquamation over the upper chest and upper back.   Lab Results:  Lab Results  Component Value Date   WBC 5.0 10/22/2017   HGB 13.2 08/26/2017   HCT 40.6 10/22/2017   MCV 98.1 (H) 10/22/2017   PLT 153 10/22/2017   NEUTROABS 3.1 10/22/2017    Imaging:  No results found.  Medications: I have reviewed the patient's current medications.  Assessment/Plan: 1. 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 EUS 03/16/2015 confirmed a clinical stage IIb (uT3,uN1) tumor ? Staging CTs of the chest, abdomen, and pelvis with no evidence of metastatic disease ? Initiation of radiation 04/19/2015, cycle 1 Taxol/carboplatin 04/20/2015: Radiation completed 05/29/2015; the fifth and final week of Taxol/carboplatin 05/18/2015 ? Restaging CTs 07/20/2015 revealed no evidence of metastatic disease, incidental left lower lobe pulmonary embolism ? Transhiatal total esophagectomy 07/25/2015 confirmed a ypT3, ypN2 with 4/5  positive lymph nodes, lymphovascular invasion, perineural invasion, negative resection margins ? CT neck 07/25/2017-4-5 cm mass in the right lower neck upper mediastinum immediately adjacent to the esophageal anastomosis. 12 mm mass within the right parotid enlarged since the previous PET study where it was about half that size. Question few small pulmonary nodules. Borderline enlarged mediastinal lymph nodes. ? CTs brain/chest/abdomen/pelvis 08/06/2017-ill-defined mass at the right thoracic inlet extending into the right superior mediastinum; new bilateral but right much greater than left peripheral reticulonodular and groundglass opacities; scattered small paratracheal and anterior mediastinal lymph nodes; small bilateral axillary lymph nodes. Brain CT with no evidence of metastasis. ? 08/26/2017 biopsy right supraclavicular lymph node-very scant fragment of benign lymph node. ? PET scan 09/11/2017-poorly marginated heterogeneously hypermetabolic soft tissue mass at the right thoracic inlet extending laterally from the esophagogastric anastomosis in the neck. No definite hypermetabolic metastatic disease.Patchy tree-in-bud opacities throughout the lungs significantly decreased. Persistent scattered subcentimeter solid pulmonary nodules in both lungs stable to decreased. Stable irregular 1.7 cm nodular bandlike opacity anterior left lower lobe with associated low-level metabolism. Nonspecific mildly hypermetabolic subcarinal and bilateral hilar lymph nodes. Nonspecific mildly hypermetabolic nodular focus of soft tissue in the upper left retroperitoneum anterior to the adrenal gland. ? Upper endoscopy 09/15/2017-at 20 cm from the incisors tight stricture at the anastomosis measuring only a few millimeters in diameter. Surrounding tissues firm, slightly friable, noncompliant. Stricture was dilated with a 15 mm balloon several times. Diameter of stricture improved but still would not permit passage of a  standard upper endoscope. Friable areas biopsied-invasive adenocarcinoma. ? Initiation of radiation and concurrent capecitabine 09/22/2017  2. Postprandial subxiphoid  pain secondary to #1  3. Diabetes-elevated blood sugar readings at the [facility] and at home  4. COPD  5. Depression-Improved with Wellbutrin  6. Hypertension  7. History of adenomatous colon polyps  8. History of diabetic related neuropathy, peripheral arterial disease, and nephropathy  9. History of nausea-likely related to radiation esophagitis/gastritis  10. Incidental pulmonary embolism noted on the CT 07/20/2015, treated with Xarelto  11. Admission 08/18/2015 with GI bleeding while on Xarelto, anticoagulation therapy discontinued  12. Dysphagia-improved with esophageal dilatation procedures by Dr. Henrene Pastor, progressive dysphagia secondary to local recurrence of esophagus cancer with obstruction at the esophagus anastomosis  13.History ofmild thrombocytopenia  14. CT scan abdomen/pelvis 10/09/2016 (done to evaluate persistent nausea, epigastric pain)-no acute findings or explanation for the patient's symptoms. Resolving postsurgical changes from presumed previous esophagectomy and gastric pull-through. No evidence of metastatic disease.     Disposition: Ronald Johnson has completed the course of concurrent radiation/Xeloda.  Plan for repeat CT imaging in 2-3 months.    He has dysphagia/odynophagia which will hopefully improve over the next few weeks.  He is able to tolerate liquids without difficulty.    He has erythema/desquamation over the upper chest/upper back.  He will continue supportive care as recommended by radiation oncology.  The rash over the forearms is most likely related to Xeloda and should improve over the next several weeks.  He will return for a follow-up visit in 3-4 weeks.  He will contact the office in the interim with any problems.  Patient seen with Dr.  [name].    Ned Card ANP/GNP-BC   10/31/2017  11:26 AM  This was a shared visit with Ned Card.  Ronald Johnson has completed the course of palliative radiation and Xeloda.  We will plan for a restaging CT evaluation within the next 2-3 months.  Julieanne Manson, MD

## 2017-11-03 ENCOUNTER — Ambulatory Visit: Payer: Medicare Other

## 2017-11-03 ENCOUNTER — Telehealth: Payer: Self-pay | Admitting: Neurology

## 2017-11-03 ENCOUNTER — Telehealth: Payer: Self-pay | Admitting: Nurse Practitioner

## 2017-11-03 NOTE — Telephone Encounter (Signed)
-----   Message from [place], DO sent at 11/03/2017  9:23 AM EDT ----- Can you call patient.  Can we give him sedating meds.  Would have been great if MRI let us know he couldn't tolerate. ----- Message ----- From: Annamaria Helling, CMA Sent: 11/03/2017   7:38 AM To: Eustace Quail Tat, DO  According to notes I found he showed up on 10/28/17 but couldn't complete the test due to claustrophobia.   ----- Message ----- From: Ludwig Clarks, DO Sent: 11/03/2017   7:32 AM To: Annamaria Helling, CMA  When is his MRI scheduled?  Showing up to me as unresulted test. ----- Message ----- From: SYSTEM Sent: 11/02/2017  12:06 AM To: Eustace Quail Tat, DO

## 2017-11-03 NOTE — Progress Notes (Signed)
When is his MRI scheduled?  Showing up to me as unresulted test.

## 2017-11-03 NOTE — Telephone Encounter (Signed)
Suspect Horners and etiology likely due to Encasement of the right common carotid artery at the thoracic inlet by a 5.9 by 3.7 by 5.1 cm mass with ill-defined margins in knee right upper paratracheal and right paraesophageal space extending up into the supraclavicular region.  He is undergoing tx for this.

## 2017-11-03 NOTE — Telephone Encounter (Signed)
Spoke with patient and offered anti-anxiety medication to complete MR.   He states he just finished treatments for his esophageal cancer and is not strong enough right now to undergo any other testing. He would like to contact us back when he wants to proceed with MR but refuses at this time.   Dr. Carles Collet Juluis Rainier.

## 2017-11-03 NOTE — Telephone Encounter (Signed)
Scheduled appt per 3/15 los - Patient is aware of appt date and time

## 2017-11-04 ENCOUNTER — Ambulatory Visit: Payer: Medicare Other

## 2017-11-10 DIAGNOSIS — E162 Hypoglycemia, unspecified: Secondary | ICD-10-CM | POA: Diagnosis not present

## 2017-11-10 DIAGNOSIS — E161 Other hypoglycemia: Secondary | ICD-10-CM | POA: Diagnosis not present

## 2017-11-17 NOTE — Progress Notes (Signed)
Subjective:   Ronald Johnson was seen in consultation in the movement disorder clinic at the request of [name], MD.  This patient is accompanied in the office by his spouse who supplements the history.  The records that were made available to me were reviewed, including records from recent hospitalization.  The evaluation is for tremor.  Tremor started approximately 2 years ago and involves the bilateral hands.   He is R handed but both hands have equal tremor.   Tremor is most noticeable when writing but also when his hands are at rest.  It is worse in the AM.   There is no family hx of tremor.    Affected by caffeine:  No. (2 cups of coffee per day) Affected by alcohol:  hasnt had EtOh for 2 years Affected by stress:  Yes.   Affected by fatigue:  No. Spills soup if on spoon:  No. Spills glass of liquid if full:  No. Affects ADL's (tying shoes, brushing teeth, etc):  No.   Any other sx's: Voice: no change Sleep: sleeps well  Vivid Dreams:  Yes.    Acting out dreams:  No. Wet Pillows: No. Postural symptoms:  Yes.    Falls?  No. Bradykinesia symptoms: no bradykinesia noted Loss of smell:  No. Loss of taste:  No. Urinary Incontinence:  No. Difficulty Swallowing:  Yes.  , trouble with meats since esophageal CA and surgery for removal of stomach and part of esophagus Handwriting, micrographia: No., just more tremulous Depression:  No. Memory changes:  Yes.  , short term; recently in the hospital after having spell and "couldn't think clearly" and couldn't get words out correct.  He tripped when got out of his truck but otherwise could walk.  No lateralizing weakness or parethesias.  Wife stated that for a few words it was like he was like talking in tongues.  Lasted 1-2 hours.  He went to the hospital and started on plavix.  Had 2 other similar episodes within 6 months but didn't go to the hospital and took a nap and when woke up he got better.  His lipitor was increased from 10 mg  to 40 mg in the hospital.   N/V:  Yes.   , had some nausea but better (due to lack of stomach) Lightheaded:  No.  Syncope: No. Diplopia:  No. Dyskinesia:  No.   Current/Previously tried tremor medications: n/a  Current medications that may exacerbate tremor:  n/a  Outside reports reviewed: historical medical records, lab reports, office notes and referral letter/letters.  Patient brought me MRI of the brain films dated June 09, 2017.  These were done without gadolinium.  I did review these.  Brain images were limited as this was primarily an MRA of the brain.  Diffusion weighted images were negative.  There was evidence of fairly significant small vessel disease from what I could see.  MRA of the brain was reported to show no evidence of focal stenosis within the internal carotid arteries.  There was some decreased flow within the left vertebral artery, but notes indicated that this could be exaggerated by the technique utilized.  10/06/17 update: Patient returns today at the request of Dr. Loanne Drilling for a new issue.  The records that were made available to me were reviewed.  This patient is accompanied in the office by his spouse who supplements the history. The patient returns to the office for evaluation of headache.  Headache started at the end of September.  He was seen for this headache in November, but at that time it was unknown at that he had developed a local recurrence of his esophageal cancer.  Headache is located in the bilateral frontal region.  Headaches are not unusual for him but they usually are not persistent.  There is no photophobia or phonophobia.  No nausea or vomiting.  Patient's headache is described as stabbing.  It goes away and comes back.  The headache lasts from 15 min to 5 hours.  He was given a prescription for tramadol in November by Dr. Loanne Drilling.  He has run out of that.  He is taking hydrocodone or oxycodone now.  He is taking something 6 days per week for headache.   He states that during radiation daily they put a mask on him and this creates a headache.  Wife notes that he has a "small eye" on the L and he thinks that it correlates with the headache. Unknown if sweats on both sides of face.    Unsure if pupils equal.  Pt doesn't notice the small eye.  Only the wife.  "small eye" comes and goes.  Wife unsure if present after wakes up or after nap.   CT of the brain was completed on August 06, 2017 to stage recurrence of cancer. this was done with and without contrast.  There is no evidence of metastatic disease on this.  11/19/17 update: Patient is seen today in follow-up.  Numerous records have been reviewed.  Patient was sent for MRI/MRA, but was not able to complete the test due to claustrophobia.  We offered sedating medication, but ultimately the patient did not want to proceed.  The patient had been undergoing radiation.  Record review indicate that there was encasement of the right common carotid artery at the thoracic inlet by a 5.9 x 3.7 x 5.1 cm mass with ill-defined margins.  He has been undergoing radiation therapy and completed about 3 weeks ago.  He hated the radiation mask.  We did start topiramate last visit for his headaches.  He reports today that headaches are better. He states that he is at least 50% better.  Headaches are mild and may be daily but don't last long.    No Known Allergies  Outpatient Encounter Medications as of 11/19/2017  Medication Sig  . atorvastatin (LIPITOR) 10 MG tablet Take 1 tablet (10 mg total) by mouth daily. (Patient taking differently: Take 10 mg by mouth every evening. )  . escitalopram (LEXAPRO) 20 MG tablet Take 1 tablet (20 mg total) by mouth daily.  Marland Kitchen glucose blood (PRECISION XTRA TEST STRIPS) test strip Check blood sugar three times a day dx 250.01  . ibuprofen (ADVIL,MOTRIN) 200 MG tablet Take 400 mg by mouth every 6 (six) hours as needed for headache or moderate pain.   Marland Kitchen insulin NPH-regular Human (HUMULIN 70/30)  (70-30) 100 UNIT/ML injection Inject 100 Units into the skin daily with breakfast.  . Insulin Syringe-Needle U-100 30G X 3/8" 1 ML MISC Use to inject insulin 3 times daily.  . RABEprazole (ACIPHEX) 20 MG tablet TAKE 1 TABLET(20 MG) BY MOUTH TWICE DAILY  . sildenafil (REVATIO) 20 MG tablet 1-5 tabs as needed for ED symptoms (Patient taking differently: Take 20-100 mg by mouth as needed (for ED). )  . sucralfate (CARAFATE) 1 g tablet Take 1 tablet (1 g total) by mouth 4 (four) times daily -  with meals and at bedtime. Crush and mix in 1 oz of water to  take  . [DISCONTINUED] topiramate (TOPAMAX) 100 MG tablet Take 1 tablet (100 mg total) by mouth daily.  Marland Kitchen topiramate (TOPAMAX) 50 MG tablet Take 1 tablet (50 mg total) by mouth as directed. 2 in the AM, 1 at night  . [DISCONTINUED] cetirizine (ZYRTEC) 10 MG tablet Take 10 mg by mouth daily as needed for allergies.   . [DISCONTINUED] LORazepam (ATIVAN) 1 MG tablet Take 1-2 tablets (1-2 mg total) by mouth every 8 (eight) hours as needed for anxiety.  . [DISCONTINUED] metoprolol succinate (TOPROL-XL) 25 MG 24 hr tablet Take 25 mg by mouth daily.   . [DISCONTINUED] Multiple Vitamin (MULTIVITAMIN) tablet Take 1 tablet by mouth daily.  . [DISCONTINUED] Oxycodone HCl 10 MG TABS Take 1 tablet (10 mg total) by mouth every 4 (four) hours as needed.  . [DISCONTINUED] traMADol (ULTRAM) 50 MG tablet Take 1 tablet (50 mg total) every 6 (six) hours as needed by mouth (headache).   Facility-Administered Encounter Medications as of 11/19/2017  Medication  . 0.9 %  sodium chloride infusion  . [DISCONTINUED] 0.9 %  sodium chloride infusion    Past Medical History:  Diagnosis Date  . Anemia 08/15/2009  . Anxiety   . Arthritis   . [name]'s esophagus 2007  . COLONIC POLYPS, ADENOMATOUS 08/01/2008, 2013, 2014  . COPD (chronic obstructive pulmonary disease) (HCC)    no per pt  . Depression   . Diabetes type 2, controlled ([place]) 03/13/2007  . Diverticulitis   . ED  (erectile dysfunction)   . EMPHYSEMA 11/08/2008   no per pt  . Esophageal cancer ([place]) "dx'd ~ 01/2015"  . GERD 03/13/2007  . GIB (gastrointestinal bleeding) 08/18/2015  . Hiatal hernia   . History of blood transfusion 08/18/2015   due to GIB  . HYPERCHOLESTEROLEMIA 02/03/2008  . HYPERTENSION 03/13/2007   pt denies, claims white coat syndrome  . Prostatism   . PULMONARY NODULE 11/24/2008  . Stomach cancer ([place]) "dx'd ~ 01/2015"  . TOBACCO ABUSE 11/24/2008    Past Surgical History:  Procedure Laterality Date  . BALLOON DILATION N/A 03/19/2016   Procedure: BALLOON DILATION;  Surgeon: Irene Shipper, MD;  Location: [facility];  Service: Endoscopy;  Laterality: N/A;  . CIRCUMCISION    . COLONOSCOPY    . COMPLETE ESOPHAGECTOMY N/A 07/25/2015   Procedure: TRANSHIATAL TOTAL ESOPHAGECTOMY COMPLETE PYLOROMYOTOMY;  Surgeon: [name], MD;  Location: [facility];  Service: Thoracic;  Laterality: N/A;  . EGD  11/19/2005  . ESOPHAGOGASTRODUODENOSCOPY N/A 03/19/2016   Procedure: ESOPHAGOGASTRODUODENOSCOPY (EGD);  Surgeon: Irene Shipper, MD;  Location: [facility];  Service: Endoscopy;  Laterality: N/A;  . ESOPHAGOGASTRODUODENOSCOPY (EGD) WITH PROPOFOL N/A 09/15/2017   Procedure: ESOPHAGOGASTRODUODENOSCOPY (EGD) WITH PROPOFOL;  Surgeon: Irene Shipper, MD;  Location: [facility];  Service: Endoscopy;  Laterality: N/A;  . EUS N/A 03/16/2015   Procedure: UPPER ENDOSCOPIC ULTRASOUND (EUS) RADIAL;  Surgeon: Milus Banister, MD;  Location: [facility];  Service: Endoscopy;  Laterality: N/A;  . FOOT FRACTURE SURGERY Right 1980   right foot w/ pins and screws  . JEJUNOSTOMY N/A 07/25/2015   Procedure: FEEDING JEJUNOSTOMY;  Surgeon: [name], MD;  Location: [place];  Service: Thoracic;  Laterality: N/A;  . POLYPECTOMY    . removal pins and screws foot  1980   right foot  . UPPER GASTROINTESTINAL ENDOSCOPY    . VASECTOMY    . VIDEO BRONCHOSCOPY N/A 07/25/2015   Procedure: VIDEO BRONCHOSCOPY;  Surgeon: [name], MD;  Location:  [facility] OR;  Service: Thoracic;  Laterality: N/A;    Social History   Socioeconomic History  . Marital status: Married    Spouse name: Not on file  . Number of children: 2  . Years of education: Not on file  . Highest education level: Not on file  Occupational History  . Occupation: retired    Fish farm manager: [employer]    Comment: works Education administrator  . Financial resource strain: Not on file  . Food insecurity:    Worry: Not on file    Inability: Not on file  . Transportation needs:    Medical: Not on file    Non-medical: Not on file  Tobacco Use  . Smoking status: Former Smoker    Packs/day: 1.00    Years: 35.00    Pack years: 35.00    Types: Cigarettes    Last attempt to quit: 10/31/2008    Years since quitting: 9.0  . Smokeless tobacco: Never Used  Substance and Sexual Activity  . Alcohol use: No    Alcohol/week: 0.0 oz  . Drug use: No  . Sexual activity: Not Currently    Partners: Female  Lifestyle  . Physical activity:    Days per week: Not on file    Minutes per session: Not on file  . Stress: Not on file  Relationships  . Social connections:    Talks on phone: Not on file    Gets together: Not on file    Attends religious service: Not on file    Active member of club or organization: Not on file    Attends meetings of clubs or organizations: Not on file    Relationship status: Not on file  . Intimate partner violence:    Fear of current or ex partner: Not on file    Emotionally abused: Not on file    Physically abused: Not on file    Forced sexual activity: Not on file  Other Topics Concern  . Not on file  Social History Narrative   Married, wife [place] with #2 grown children   Prior [employer], where he met his wife in [place]   Retired-prior work Personnel officer ADL's    Family Status  Relation Name Status  . Mother  Deceased       CVA  . MGM  (Not Specified)  . PGM  (Not  Specified)  . Father  Deceased  . Sister 3 Alive  . Brother 1 Alive  . Neg Hx  (Not Specified)    Review of Systems A complete 10 system ROS was obtained and was negative apart from what is mentioned.   Objective:   VITALS:   Vitals:   11/19/17 0935  BP: 100/64  Pulse: 98  SpO2: 95%  Weight: 207 lb (93.9 kg)  Height: 6' 4" (1.93 m)   Wt Readings from Last 3 Encounters:  11/19/17 207 lb (93.9 kg)  10/31/17 214 lb 11.2 oz (97.4 kg)  10/22/17 219 lb 1.6 oz (99.4 kg)     Gen:  Appears stated age and in NAD. HEENT:  Normocephalic, atraumatic. The mucous membranes are moist. The superficial temporal arteries are without ropiness or tenderness. Cardiovascular: Regular rate and rhythm. Lungs: Clear to auscultation bilaterally. Neck: There are no carotid bruits noted bilaterally.  NEUROLOGICAL:  Orientation:  The patient is alert and oriented x 3.   Cranial nerves: There is good facial symmetry. The pupils are equal round and  reactive to light bilaterally (no miosis). Slight R ptosis. Speech is fluent and clear but is soft and hoarse. Soft palate rises symmetrically and there is no tongue deviation. Hearing is intact to conversational tone. Tone: Tone is good throughout. Sensation: Sensation is intact to light touch  Coordination:  The patient has no decremation with any form of RAMS, including alternating supination and pronation of the forearm, hand opening and closing, finger taps, heel taps and toe taps. Motor: Strength is 5/5 in the bilateral upper and lower extremities.  Shoulder shrug is equal bilaterally.  There is no pronator drift.  There are no fasciculations noted. Gait and Station: The patient is able to ambulate without difficulty.  MOVEMENT EXAM: Tremor:  There is a rare and mild rest tremor on the right.    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  He has just finished radiation therapy.  Discussed nature/etiology to pt/wife.  They asked me to write down dx.  2.  Headache  -improved but not gone on topamax.  -increase Topamax, 50 mg in the AM and 100 mg at night.  He would like to do all 50 mg tablets.    2.  Peripheral neuropathy  -The patient has clinical examination evidence of a diffuse peripheral neuropathy, which is likely due to peripheral neuropathy.  Safety was discussed.  3. Rest tremor  -no evidence of PD.  Will keep monitoring  4.  Weight loss  -related to dysphagia.  However, told him about topamax contributing to weight loss.  Will monitor  5.  Follow up is anticipated in the next 4-5 months, sooner should new neurologic issues arise.  Much greater than 50% of this visit was spent in counseling and coordinating care.  Total face to face time:  25 min    CC:  [name], MD

## 2017-11-19 ENCOUNTER — Encounter: Payer: Self-pay | Admitting: Neurology

## 2017-11-19 ENCOUNTER — Ambulatory Visit (INDEPENDENT_AMBULATORY_CARE_PROVIDER_SITE_OTHER): Payer: Medicare Other | Admitting: Neurology

## 2017-11-19 VITALS — BP 100/64 | HR 98 | Ht 76.0 in | Wt 207.0 lb

## 2017-11-19 DIAGNOSIS — R51 Headache: Secondary | ICD-10-CM

## 2017-11-19 DIAGNOSIS — R519 Headache, unspecified: Secondary | ICD-10-CM

## 2017-11-19 DIAGNOSIS — G902 Horner's syndrome: Secondary | ICD-10-CM | POA: Diagnosis not present

## 2017-11-19 DIAGNOSIS — R634 Abnormal weight loss: Secondary | ICD-10-CM

## 2017-11-19 DIAGNOSIS — C160 Malignant neoplasm of cardia: Secondary | ICD-10-CM

## 2017-11-19 DIAGNOSIS — R251 Tremor, unspecified: Secondary | ICD-10-CM

## 2017-11-19 MED ORDER — TOPIRAMATE 50 MG PO TABS: 50.0000 mg | ORAL_TABLET | ORAL | 1 refills | Status: DC

## 2017-11-19 NOTE — Patient Instructions (Signed)
1.  Increase topamax, 50 mg, 2 in the AM and 1 at night.  Use up your 100 mg topamax first by taking 1 in the AM and 1/2 at night  2.  You have Horners syndrome related to the mass in the neck

## 2017-11-20 ENCOUNTER — Other Ambulatory Visit: Payer: Self-pay | Admitting: Neurology

## 2017-11-20 ENCOUNTER — Encounter: Payer: Self-pay | Admitting: Endocrinology

## 2017-11-20 ENCOUNTER — Ambulatory Visit (INDEPENDENT_AMBULATORY_CARE_PROVIDER_SITE_OTHER): Payer: Medicare Other | Admitting: Endocrinology

## 2017-11-20 ENCOUNTER — Other Ambulatory Visit: Payer: Self-pay

## 2017-11-20 VITALS — BP 150/82 | HR 101 | Wt 208.8 lb

## 2017-11-20 DIAGNOSIS — E119 Type 2 diabetes mellitus without complications: Secondary | ICD-10-CM

## 2017-11-20 DIAGNOSIS — IMO0001 Reserved for inherently not codable concepts without codable children: Secondary | ICD-10-CM

## 2017-11-20 DIAGNOSIS — Z794 Long term (current) use of insulin: Secondary | ICD-10-CM

## 2017-11-20 MED ORDER — TOPIRAMATE 50 MG PO TABS: ORAL_TABLET | ORAL | 1 refills | Status: AC

## 2017-11-20 MED ORDER — GLUCAGON (RDNA) 1 MG IJ KIT: 1.0000 mg | PACK | Freq: Once | INTRAMUSCULAR | 12 refills | Status: AC | PRN

## 2017-11-20 MED ORDER — INSULIN NPH ISOPHANE & REGULAR (70-30) 100 UNIT/ML ~~LOC~~ SUSP: 30.0000 [IU] | Freq: Every day | SUBCUTANEOUS | 11 refills | Status: DC

## 2017-11-20 NOTE — Progress Notes (Signed)
 Subjective:    Patient ID: Ronald Johnson, male    DOB: 08/25/1945, 71 y.o.   MRN: [id number]  HPI Pt returns for f/u of diabetes mellitus: DM type: Insulin-requiring type 2.  Dx'ed: 1993 Complications: polyneuropathy, CAD, PAD, and renal insuff.  Therapy: insulin since 2004.  DKA: never.  Severe hypoglycemia: never.   Pancreatitis: never.   Other: he changed to qd insulin, after he did not achieve good control on multiple daily injections; he cannot afford insulin analogs; insulin requirement has varied since cancer dx; he changed to QAM 70/30, due to pattern of cbg's.  Interval history: no cbg record, but states cbg's are frequently low. He recently had an episode of severe hypoglycemia, at 5 AM  He takes 50-80 units qd.   He does not take this until the evening, when cbg is higher.  He finished XRT a few weeks ago.   Past Medical History:  Diagnosis Date  . Anemia 08/15/2009  . Anxiety   . Arthritis   . [name]'s esophagus 2007  . COLONIC POLYPS, ADENOMATOUS 08/01/2008, 2013, 2014  . COPD (chronic obstructive pulmonary disease) (HCC)    no per pt  . Depression   . Diabetes type 2, controlled (HCC) 03/13/2007  . Diverticulitis   . ED (erectile dysfunction)   . EMPHYSEMA 11/08/2008   no per pt  . Esophageal cancer (HCC) "dx'd ~ 01/2015"  . GERD 03/13/2007  . GIB (gastrointestinal bleeding) 08/18/2015  . Hiatal hernia   . History of blood transfusion 08/18/2015   due to GIB  . HYPERCHOLESTEROLEMIA 02/03/2008  . HYPERTENSION 03/13/2007   pt denies, claims white coat syndrome  . Prostatism   . PULMONARY NODULE 11/24/2008  . Stomach cancer (HCC) "dx'd ~ 01/2015"  . TOBACCO ABUSE 11/24/2008    Past Surgical History:  Procedure Laterality Date  . BALLOON DILATION N/A 03/19/2016   Procedure: BALLOON DILATION;  Surgeon: John N Perry, MD;  Location: [facility];  Service: Endoscopy;  Laterality: N/A;  . CIRCUMCISION    . COLONOSCOPY    . COMPLETE ESOPHAGECTOMY N/A 07/25/2015   Procedure: TRANSHIATAL TOTAL ESOPHAGECTOMY COMPLETE PYLOROMYOTOMY;  Surgeon: Edward B Gerhardt, MD;  Location: [facility];  Service: Thoracic;  Laterality: N/A;  . EGD  11/19/2005  . ESOPHAGOGASTRODUODENOSCOPY N/A 03/19/2016   Procedure: ESOPHAGOGASTRODUODENOSCOPY (EGD);  Surgeon: John N Perry, MD;  Location: [facility];  Service: Endoscopy;  Laterality: N/A;  . ESOPHAGOGASTRODUODENOSCOPY (EGD) WITH PROPOFOL N/A 09/15/2017   Procedure: ESOPHAGOGASTRODUODENOSCOPY (EGD) WITH PROPOFOL;  Surgeon: Perry, John N, MD;  Location: [facility];  Service: Endoscopy;  Laterality: N/A;  . EUS N/A 03/16/2015   Procedure: UPPER ENDOSCOPIC ULTRASOUND (EUS) RADIAL;  Surgeon: Daniel P Jacobs, MD;  Location: [facility];  Service: Endoscopy;  Laterality: N/A;  . FOOT FRACTURE SURGERY Right 1980   right foot w/ pins and screws  . JEJUNOSTOMY N/A 07/25/2015   Procedure: FEEDING JEJUNOSTOMY;  Surgeon: Edward B Gerhardt, MD;  Location: [facility];  Service: Thoracic;  Laterality: N/A;  . POLYPECTOMY    . removal pins and screws foot  1980   right foot  . UPPER GASTROINTESTINAL ENDOSCOPY    . VASECTOMY    . VIDEO BRONCHOSCOPY N/A 07/25/2015   Procedure: VIDEO BRONCHOSCOPY;  Surgeon: Edward B Gerhardt, MD;  Location: [facility];  Service: Thoracic;  Laterality: N/A;    Social History   Socioeconomic History  . Marital status: Married    Spouse name: Not on file  . Number of children: 2  .   Years of education: Not on file  . Highest education level: Not on file  Occupational History  . Occupation: retired    Fish farm manager: [employer]    Comment: works Education administrator  . Financial resource strain: Not on file  . Food insecurity:    Worry: Not on file    Inability: Not on file  . Transportation needs:    Medical: Not on file    Non-medical: Not on file  Tobacco Use  . Smoking status: Former Smoker    Packs/day: 1.00    Years: 35.00    Pack years: 35.00    Types: Cigarettes    Last attempt to  quit: 10/31/2008    Years since quitting: 9.0  . Smokeless tobacco: Never Used  Substance and Sexual Activity  . Alcohol use: No    Alcohol/week: 0.0 oz  . Drug use: No  . Sexual activity: Not Currently    Partners: Female  Lifestyle  . Physical activity:    Days per week: Not on file    Minutes per session: Not on file  . Stress: Not on file  Relationships  . Social connections:    Talks on phone: Not on file    Gets together: Not on file    Attends religious service: Not on file    Active member of club or organization: Not on file    Attends meetings of clubs or organizations: Not on file    Relationship status: Not on file  . Intimate partner violence:    Fear of current or ex partner: Not on file    Emotionally abused: Not on file    Physically abused: Not on file    Forced sexual activity: Not on file  Other Topics Concern  . 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 atorvastatin (LIPITOR) 10 MG tablet Take 1 tablet (10 mg total) by mouth daily. (Patient taking differently: Take 10 mg by mouth every evening. ) 90 tablet 3  . escitalopram (LEXAPRO) 20 MG tablet Take 1 tablet (20 mg total) by mouth daily. 90 tablet 3  . glucose blood (PRECISION XTRA TEST STRIPS) test strip Check blood sugar three times a day dx 250.01 270 each 3  . ibuprofen (ADVIL,MOTRIN) 200 MG tablet Take 400 mg by mouth every 6 (six) hours as needed for headache or moderate pain.     . Insulin Syringe-Needle U-100 30G X 3/8" 1 ML MISC Use to inject insulin 3 times daily. 300 each 1  . RABEprazole (ACIPHEX) 20 MG tablet TAKE 1 TABLET(20 MG) BY MOUTH TWICE DAILY 60 tablet 3  . sildenafil (REVATIO) 20 MG tablet 1-5 tabs as needed for ED symptoms (Patient taking  differently: Take 20-100 mg by mouth as needed (for ED). ) 100 tablet 11  . sucralfate (CARAFATE) 1 g tablet Take 1 tablet (1 g total) by mouth 4 (four) times daily -  with meals and at bedtime. Crush and mix in 1 oz of water to take 120 tablet 1  . [DISCONTINUED] metoprolol succinate (TOPROL-XL) 25 MG 24 hr tablet Take 25 mg by mouth daily.      Current Facility-Administered Medications on File Prior to Visit  Medication  Dose Route Frequency Provider Last Rate Last Dose  . 0.9 %  sodium chloride infusion   Intravenous Once Owens Shark, NP        No Known Allergies  Family History  Problem Relation Age of Onset  . Stroke Mother   . Diabetes Mother   . Diabetes Maternal Grandmother        mother side of the family aunts, MGF  . Breast cancer Paternal Grandmother   . AAA (abdominal aortic aneurysm) Father   . Colon cancer Neg Hx   . Esophageal cancer Neg Hx   . Rectal cancer Neg Hx   . Stomach cancer Neg Hx     BP (!) 150/82 (BP Location: Left Arm, Patient Position: Sitting, Cuff Size: Normal)   Pulse (!) 101   Wt 208 lb 12.8 oz (94.7 kg)   SpO2 93%   BMI 25.42 kg/m    Review of Systems He has lost 15 lbs.     Objective:   Physical Exam VITAL SIGNS:  See vs page GENERAL: no distress Pulses: foot pulses are intact bilaterally.   MSK: no deformity of the feet or ankles.  CV: no edema of the legs or ankles Skin:  no ulcer on the feet or ankles.  normal color and temp on the feet and ankles Neuro: sensation is intact to touch on the feet and ankles.    Lab Results  Component Value Date   CREATININE 1.38 (H) 10/22/2017   BUN 20 10/22/2017   NA 140 10/22/2017   K 4.0 10/22/2017   CL 104 10/22/2017   CO2 25 10/22/2017   Lab Results  Component Value Date   WBC 5.0 10/22/2017   HGB 13.2 08/26/2017   HCT 40.6 10/22/2017   MCV 98.1 (H) 10/22/2017   PLT 153 10/22/2017        Assessment & Plan:  Insulin-requiring type 2 DM: overcontrolled.  esophageal cancer:  this is causing weight loss. Hypoglycemia: worse: due to weight loss.   Patient Instructions  Please reduce the insulin to 30 units with breakfast, no matter what your blood sugar is.  On this type of insulin schedule, you should eat meals on a regular schedule.  If a meal is missed or significantly delayed, your blood sugar could go low. check your blood sugar twice a day.  vary the time of day when you check, between before the 3 meals, and at bedtime.  also check if you have symptoms of your blood sugar being too high or too low.  please keep a record of the readings and bring it to your next appointment here (or you can bring the meter itself).  You can write it on any piece of paper.  please call us sooner if your blood sugar goes below 70, or if you have a lot of readings over 200. Please continue to carefully watch the blood sugar, because your cancer can cause variation in your insulin need.  Please come back for a follow-up appointment in 1 month.  We'll recheck the blood pressure then.

## 2017-11-20 NOTE — Patient Instructions (Addendum)
Please reduce the insulin to 30 units with breakfast, no matter what your blood sugar is.  On this type of insulin schedule, you should eat meals on a regular schedule.  If a meal is missed or significantly delayed, your blood sugar could go low. check your blood sugar twice a day.  vary the time of day when you check, between before the 3 meals, and at bedtime.  also check if you have symptoms of your blood sugar being too high or too low.  please keep a record of the readings and bring it to your next appointment here (or you can bring the meter itself).  You can write it on any piece of paper.  please call us sooner if your blood sugar goes below 70, or if you have a lot of readings over 200. Please continue to carefully watch the blood sugar, because your cancer can cause variation in your insulin need.  Please come back for a follow-up appointment in 1 month.  We'll recheck the blood pressure then.

## 2017-11-25 ENCOUNTER — Telehealth: Payer: Self-pay

## 2017-11-25 ENCOUNTER — Inpatient Hospital Stay: Payer: Medicare Other | Attending: Nurse Practitioner | Admitting: Oncology

## 2017-11-25 VITALS — BP 99/84 | HR 90 | Temp 97.7°F | Resp 18 | Ht 76.0 in | Wt 207.0 lb

## 2017-11-25 DIAGNOSIS — Z86010 Personal history of colonic polyps: Secondary | ICD-10-CM | POA: Diagnosis not present

## 2017-11-25 DIAGNOSIS — I10 Essential (primary) hypertension: Secondary | ICD-10-CM

## 2017-11-25 DIAGNOSIS — R490 Dysphonia: Secondary | ICD-10-CM

## 2017-11-25 DIAGNOSIS — E1121 Type 2 diabetes mellitus with diabetic nephropathy: Secondary | ICD-10-CM | POA: Diagnosis not present

## 2017-11-25 DIAGNOSIS — D696 Thrombocytopenia, unspecified: Secondary | ICD-10-CM | POA: Insufficient documentation

## 2017-11-25 DIAGNOSIS — C160 Malignant neoplasm of cardia: Secondary | ICD-10-CM | POA: Diagnosis not present

## 2017-11-25 DIAGNOSIS — J449 Chronic obstructive pulmonary disease, unspecified: Secondary | ICD-10-CM | POA: Diagnosis not present

## 2017-11-25 DIAGNOSIS — Z923 Personal history of irradiation: Secondary | ICD-10-CM | POA: Insufficient documentation

## 2017-11-25 DIAGNOSIS — R1310 Dysphagia, unspecified: Secondary | ICD-10-CM

## 2017-11-25 DIAGNOSIS — Z86711 Personal history of pulmonary embolism: Secondary | ICD-10-CM | POA: Diagnosis not present

## 2017-11-25 MED ORDER — OXYCODONE HCL 5 MG PO TABS: 5.0000 mg | ORAL_TABLET | Freq: Every day | ORAL | 0 refills | Status: DC | PRN

## 2017-11-25 NOTE — Progress Notes (Signed)
Ronald Johnson returns as scheduled.  He completed radiation and Xeloda on 10/30/2017.  He reports no improvement in dysphasia.  He is able to tolerate a diet.  He has odynophagia.  He takes Carafate prior to eating.  He continues to have hoarseness.  He does not wish to undergo a procedure to fix this.  Objective:  Vital signs in last 24 hours:  Blood pressure 99/84, pulse 90, temperature 97.7 F (36.5 C), temperature source Oral, resp. rate 18, height '6\' 4"'$  (1.93 m), weight 207 lb (93.9 kg), SpO2 99 %.    HEENT: 1/2 cm mobile nodular lesion inferior to the right submandibular gland Lymphatics: No cervical or supraclavicular nodes,  Resp: Distant breath sounds, no respiratory distress Cardio: Regular rate and rhythm GI: No hepatomegaly, no mass, nontender Vascular: No leg edema   Lab Results:  Lab Results  Component Value Date   WBC 5.0 10/22/2017   HGB 13.2 08/26/2017   HCT 40.6 10/22/2017   MCV 98.1 (H) 10/22/2017   PLT 153 10/22/2017   NEUTROABS 3.1 10/22/2017    CMP     Component Value Date/Time   NA 140 10/22/2017 1045   NA 138 08/05/2017 1547   K 4.0 10/22/2017 1045   K 4.0 08/05/2017 1547   CL 104 10/22/2017 1045   CO2 25 10/22/2017 1045   CO2 27 08/05/2017 1547   GLUCOSE 124 10/22/2017 1045   GLUCOSE 184 (H) 08/05/2017 1547   GLUCOSE 129 (H) 06/30/2006 0809   BUN 20 10/22/2017 1045   BUN 18.3 08/05/2017 1547   CREATININE 1.38 (H) 10/22/2017 1045   CREATININE 1.3 08/05/2017 1547   CALCIUM 10.0 10/22/2017 1045   CALCIUM 9.5 08/05/2017 1547   PROT 8.3 10/22/2017 1045   PROT 8.0 08/05/2017 1547   ALBUMIN 4.1 10/22/2017 1045   ALBUMIN 3.6 08/05/2017 1547   AST 18 10/22/2017 1045   AST 28 08/05/2017 1547   ALT 11 10/22/2017 1045   ALT 27 08/05/2017 1547   ALKPHOS 91 10/22/2017 1045   ALKPHOS 95 08/05/2017 1547   BILITOT 0.6 10/22/2017 1045   BILITOT 0.50  08/05/2017 1547   GFRNONAA 51 (L) 10/22/2017 1045   GFRAA 59 (L) 10/22/2017 1045    Lab Results  Component Value Date   CEA1 23.92 (H) 08/05/2017     Medications: I have reviewed the patient's current medications.   Assessment/Plan: 1. 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 EUS 03/16/2015 confirmed a clinical stage IIb (uT3,uN1) tumor ? Staging CTs of the chest, abdomen, and pelvis with no evidence of metastatic disease ? Initiation of radiation 04/19/2015, cycle 1 Taxol/carboplatin 04/20/2015: Radiation completed 05/29/2015; the fifth and final week of Taxol/carboplatin 05/18/2015 ? Restaging CTs 07/20/2015 revealed no evidence of metastatic disease, incidental left lower lobe pulmonary embolism ? Transhiatal total esophagectomy 07/25/2015 confirmed a ypT3, ypN2 with 4/5 positive lymph nodes, lymphovascular invasion, perineural invasion, negative resection margins ? CT neck 07/25/2017-4-5 cm mass in the right lower neck upper mediastinum immediately adjacent to the esophageal anastomosis. 12 mm mass within the right parotid enlarged since the previous PET study where it was about half that size. Question few small pulmonary nodules. Borderline enlarged mediastinal lymph nodes. ? CTs brain/chest/abdomen/pelvis 08/06/2017-ill-defined mass at the right thoracic inlet extending into the right superior mediastinum; new bilateral but right much greater than left peripheral reticulonodular and groundglass opacities; scattered small paratracheal and  anterior mediastinal lymph nodes; small bilateral axillary lymph nodes. Brain CT with no evidence of metastasis. ? 08/26/2017 biopsy right supraclavicular lymph node-very scant fragment of benign lymph node. ? PET scan 09/11/2017-poorly marginated heterogeneously hypermetabolic soft tissue mass at the right thoracic inlet extending laterally from the esophagogastric anastomosis in the neck. No definite  hypermetabolic metastatic disease.Patchy tree-in-bud opacities throughout the lungs significantly decreased. Persistent scattered subcentimeter solid pulmonary nodules in both lungs stable to decreased. Stable irregular 1.7 cm nodular bandlike opacity anterior left lower lobe with associated low-level metabolism. Nonspecific mildly hypermetabolic subcarinal and bilateral hilar lymph nodes. Nonspecific mildly hypermetabolic nodular focus of soft tissue in the upper left retroperitoneum anterior to the adrenal gland. ? Upper endoscopy 09/15/2017-at 20 cm from the incisors tight stricture at the anastomosis measuring only a few millimeters in diameter. Surrounding tissues firm, slightly friable, noncompliant. Stricture was dilated with a 15 mm balloon several times. Diameter of stricture improved but still would not permit passage of a standard upper endoscope. Friable areas biopsied-invasive adenocarcinoma. ? Initiation of radiation and concurrent capecitabine 09/22/2017 completed 10/30/2017  2. Postprandial subxiphoid pain secondary to #1  3. Diabetes-elevated blood sugar readings at the [facility] and at home  4. COPD  5. Depression-Improved with Wellbutrin  6. Hypertension  7. History of adenomatous colon polyps  8. History of diabetic related neuropathy, peripheral arterial disease, and nephropathy  9. History of nausea-likely related to radiation esophagitis/gastritis  10. Incidental pulmonary embolism noted on the CT 07/20/2015, treated with Xarelto  11. Admission 08/18/2015 with GI bleeding while on Xarelto, anticoagulation therapy discontinued  12. Dysphagia-improved with esophageal dilatation procedures by Ronald Johnson, progressive dysphagia secondary to local recurrence of esophagus cancer with obstruction at the esophagus anastomosis  13.History ofmild thrombocytopenia  14. CT scan abdomen/pelvis 10/09/2016 (done to evaluate persistent  nausea, epigastric pain)-no acute findings or explanation for the patient's symptoms. Resolving postsurgical changes from presumed previous esophagectomy and gastric pull-through. No evidence of metastatic disease.    Disposition: Ronald Johnson appears unchanged.  He has persistent dysphagia/odynophagia after completing a course of Xeloda/radiation.  He will be scheduled for restaging CTs and an office visit in approximately 4 weeks.  We will ask for additional testing on the resected tumor from December 2016 to include HER-2, PD1, and MSI testing  I refilled a prescription for oxycodone to use as needed for pain.  15 minutes were spent with the patient today.  The majority of the time was used for counseling and coordination of care.  Betsy Coder, MD  11/25/2017  12:36 PM

## 2017-11-25 NOTE — Telephone Encounter (Signed)
Printed avs and calender of upcoming appointment. Per 4/9 los 

## 2017-12-03 NOTE — Progress Notes (Signed)
Spoke with [employer] @ [facility]. Asked to add foundation 1 and PDL1 testing to report from Dec. 2016

## 2017-12-04 ENCOUNTER — Other Ambulatory Visit: Payer: Self-pay

## 2017-12-04 ENCOUNTER — Ambulatory Visit
Admission: RE | Admit: 2017-12-04 | Discharge: 2017-12-04 | Disposition: A | Discharge status: Home / Self Care | Payer: Medicare Other | Source: Ambulatory Visit | Attending: Radiation Oncology | Admitting: Radiation Oncology

## 2017-12-04 ENCOUNTER — Encounter: Payer: Self-pay | Admitting: Radiation Oncology

## 2017-12-04 VITALS — BP 96/61 | HR 88 | Temp 98.3°F | Resp 20 | Wt 206.2 lb

## 2017-12-04 DIAGNOSIS — C770 Secondary and unspecified malignant neoplasm of lymph nodes of head, face and neck: Secondary | ICD-10-CM

## 2017-12-04 DIAGNOSIS — C159 Malignant neoplasm of esophagus, unspecified: Secondary | ICD-10-CM | POA: Insufficient documentation

## 2017-12-04 DIAGNOSIS — C160 Malignant neoplasm of cardia: Secondary | ICD-10-CM

## 2017-12-09 NOTE — Progress Notes (Signed)
  Radiation Oncology         [phone] ________________________________  Name: Ronald Johnson MRN: [id number]  Date: 10/30/2017  DOB: 10-19-1945  End of Treatment Note  Diagnosis:   Esophageal cancer     Indication for treatment::  curative       Radiation treatment dates:   09/22/2017 through 10/30/2017  Site/dose:   Patient was treated to the right proximal esophagus/supraclavicular region to a dose of 50.4 gray in 28 fractions. The patient was treated with a 3-D conformal technique with concurrent chemotherapy during this treatment  Narrative: The patient tolerated radiation treatment relatively well.   Irritation of the esophagus was noted as an expected form of acute toxicity during treatment  Plan: The patient has completed radiation treatment. The patient will return to radiation oncology clinic for routine followup in one month. I advised the patient to call or return sooner if they have any questions or concerns related to their recovery or treatment. ________________________________  Jodelle Gross, M.D., Ph.D.

## 2017-12-16 NOTE — Progress Notes (Signed)
Radiation Oncology         [phone] ________________________________  Name: Ronald Johnson MRN: [id number]  Date of Service: 12/04/2017 DOB: 09/21/1945  Post Treatment Note  CC: [name], MD  Ladell Pier, MD  Diagnosis: Recurrent Esophageal cancer     Interval Since Last Radiation:  5 weeks   09/22/2017 through 10/30/2017: The patient was treated to the right proximal esophagus/supraclavicular region to a dose of 50.4 gray in 28 fractions. The patient was treated with a 3-D conformal technique with concurrent chemotherapy during this treatment.  04/19/15 - 05/29/15: The patient was treated to the upper abdominal region initially to a dose of 45 gray using a 6 field 3-D conformal technique. The patient then received a 5.4 gray boost using a 3 field technique. The patient's final dose was 50.4 gray which was given with concurrent chemotherapy.  Narrative:  The patient returns today for routine follow-up.  The patient tolerated his course of treatment but did have significant skin disruption and contact dermatitis like changes of the left chest wall during treatment past the area of the treatment field. He responded well to topical                               On review of systems, the patient states he's doing much better since I last saw him and that his skin has healed well. He continues to have hoarseness but is eating better and tolerating more types and quantities of food. No other complaints are noted, but he was pleased to have gained 3 pounds since completing treatment.   ALLERGIES:  has No Known Allergies.  Meds: Current Outpatient Medications  Medication Sig Dispense Refill  . atorvastatin (LIPITOR) 10 MG tablet Take 1 tablet (10 mg total) by mouth daily. (Patient taking differently: Take 10 mg by mouth every evening. ) 90 tablet 3  . escitalopram (LEXAPRO) 20 MG tablet Take 1 tablet (20 mg total) by mouth daily. 90 tablet 3  . glucagon (GLUCAGON EMERGENCY) 1 MG  injection Inject 1 mg into the vein once as needed for up to 1 dose. 1 each 12  . glucose blood (PRECISION XTRA TEST STRIPS) test strip Check blood sugar three times a day dx 250.01 270 each 3  . ibuprofen (ADVIL,MOTRIN) 200 MG tablet Take 400 mg by mouth every 6 (six) hours as needed for headache or moderate pain.     Marland Kitchen insulin NPH-regular Human (HUMULIN 70/30) (70-30) 100 UNIT/ML injection Inject 30 Units into the skin daily with breakfast. 1 vial 11  . Insulin Syringe-Needle U-100 30G X 3/8" 1 ML MISC Use to inject insulin 3 times daily. 300 each 1  . oxyCODONE (OXY IR/ROXICODONE) 5 MG immediate release tablet Take 1 tablet (5 mg total) by mouth daily as needed for severe pain. 30 tablet 0  . RABEprazole (ACIPHEX) 20 MG tablet TAKE 1 TABLET(20 MG) BY MOUTH TWICE DAILY 60 tablet 3  . sildenafil (REVATIO) 20 MG tablet 1-5 tabs as needed for ED symptoms (Patient taking differently: Take 20-100 mg by mouth as needed (for ED). ) 100 tablet 11  . sucralfate (CARAFATE) 1 g tablet Take 1 tablet (1 g total) by mouth 4 (four) times daily -  with meals and at bedtime. Crush and mix in 1 oz of water to take 120 tablet 1  . topiramate (TOPAMAX) 50 MG tablet 2 in the AM, 1 at night 270 tablet 1  No current facility-administered medications for this encounter.    Facility-Administered Medications Ordered in Other Encounters  Medication Dose Route Frequency Provider Last Rate Last Dose  . 0.9 %  sodium chloride infusion   Intravenous Once Owens Shark, NP        Physical Findings:  weight is 206 lb 4 oz (93.6 kg). His oral temperature is 98.3 F (36.8 C). His blood pressure is 96/61 and his pulse is 88. His respiration is 20 and oxygen saturation is 97%.  Pain Assessment Pain Score: 0-No pain/10 In general this is a well appearing Caucasian male in no acute distress. He's alert and oriented x4 and appropriate throughout the examination. Cardiopulmonary assessment is negative for acute distress and he  exhibits normal effort. The chest wall has hyperpigmentation anterior and posteriorly. No desquamation is noted.   Lab Findings: Lab Results  Component Value Date   WBC 5.0 10/22/2017   HGB 13.5 10/22/2017   HCT 40.6 10/22/2017   MCV 98.1 (H) 10/22/2017   PLT 153 10/22/2017     Radiographic Findings: No results found.  Impression/Plan: 1. Recurrent Esophageal cancer. The patient is doing well in recovering from the effects of therapy. He does continue to have hoarseness and is interested in meeting with ENT regarding this. He will contact ENT and we will follow up with him expectantly. He will also continue with medical oncology as previously outlined.           Carola Rhine, PAC

## 2017-12-18 ENCOUNTER — Encounter (HOSPITAL_COMMUNITY): Payer: Self-pay | Admitting: Oncology

## 2017-12-18 DIAGNOSIS — C160 Malignant neoplasm of cardia: Secondary | ICD-10-CM | POA: Diagnosis not present

## 2017-12-23 ENCOUNTER — Encounter (HOSPITAL_COMMUNITY): Payer: Self-pay

## 2017-12-23 ENCOUNTER — Telehealth: Payer: Self-pay | Admitting: Internal Medicine

## 2017-12-23 ENCOUNTER — Inpatient Hospital Stay: Payer: Medicare Other | Attending: Nurse Practitioner

## 2017-12-23 ENCOUNTER — Ambulatory Visit (HOSPITAL_COMMUNITY)
Admission: RE | Admit: 2017-12-23 | Discharge: 2017-12-23 | Disposition: A | Discharge status: Home / Self Care | Payer: Medicare Other | Source: Ambulatory Visit | Attending: Oncology | Admitting: Oncology

## 2017-12-23 DIAGNOSIS — J3801 Paralysis of vocal cords and larynx, unilateral: Secondary | ICD-10-CM | POA: Insufficient documentation

## 2017-12-23 DIAGNOSIS — Z5111 Encounter for antineoplastic chemotherapy: Secondary | ICD-10-CM | POA: Insufficient documentation

## 2017-12-23 DIAGNOSIS — Z452 Encounter for adjustment and management of vascular access device: Secondary | ICD-10-CM | POA: Diagnosis not present

## 2017-12-23 DIAGNOSIS — M899 Disorder of bone, unspecified: Secondary | ICD-10-CM | POA: Diagnosis not present

## 2017-12-23 DIAGNOSIS — Z86711 Personal history of pulmonary embolism: Secondary | ICD-10-CM | POA: Diagnosis not present

## 2017-12-23 DIAGNOSIS — I10 Essential (primary) hypertension: Secondary | ICD-10-CM | POA: Insufficient documentation

## 2017-12-23 DIAGNOSIS — Z9049 Acquired absence of other specified parts of digestive tract: Secondary | ICD-10-CM | POA: Insufficient documentation

## 2017-12-23 DIAGNOSIS — E119 Type 2 diabetes mellitus without complications: Secondary | ICD-10-CM | POA: Insufficient documentation

## 2017-12-23 DIAGNOSIS — R918 Other nonspecific abnormal finding of lung field: Secondary | ICD-10-CM | POA: Insufficient documentation

## 2017-12-23 DIAGNOSIS — I739 Peripheral vascular disease, unspecified: Secondary | ICD-10-CM | POA: Insufficient documentation

## 2017-12-23 DIAGNOSIS — J90 Pleural effusion, not elsewhere classified: Secondary | ICD-10-CM | POA: Insufficient documentation

## 2017-12-23 DIAGNOSIS — R4702 Dysphasia: Secondary | ICD-10-CM | POA: Insufficient documentation

## 2017-12-23 DIAGNOSIS — J449 Chronic obstructive pulmonary disease, unspecified: Secondary | ICD-10-CM | POA: Insufficient documentation

## 2017-12-23 DIAGNOSIS — C160 Malignant neoplasm of cardia: Secondary | ICD-10-CM | POA: Diagnosis not present

## 2017-12-23 DIAGNOSIS — Z86010 Personal history of colonic polyps: Secondary | ICD-10-CM | POA: Diagnosis not present

## 2017-12-23 DIAGNOSIS — Z79899 Other long term (current) drug therapy: Secondary | ICD-10-CM | POA: Diagnosis not present

## 2017-12-23 LAB — BASIC METABOLIC PANEL - CANCER CENTER ONLY
ANION GAP: 4 (ref 3–11)
BUN: 16 mg/dL (ref 7–26)
CHLORIDE: 109 mmol/L (ref 98–109)
CO2: 27 mmol/L (ref 22–29)
Calcium: 9.6 mg/dL (ref 8.4–10.4)
Creatinine: 1.04 mg/dL (ref 0.70–1.30)
GFR, Estimated: >60 mL/min (ref >60)
Glucose, Bld: 165 mg/dL — ABNORMAL HIGH (ref 70–140)
Potassium: 4 mmol/L (ref 3.5–5.1)
Sodium: 140 mmol/L (ref 136–145)

## 2017-12-23 MED ORDER — IOHEXOL 300 MG/ML  SOLN
100.0000 mL | Freq: Once | INTRAMUSCULAR | Status: AC | PRN
  Administered 2017-12-23: 100 mL via INTRAVENOUS

## 2017-12-24 MED ORDER — RABEPRAZOLE SODIUM 20 MG PO TBEC: DELAYED_RELEASE_TABLET | ORAL | 1 refills | Status: AC

## 2017-12-24 NOTE — Telephone Encounter (Signed)
Sent Aciphex sent to [facility]

## 2017-12-25 ENCOUNTER — Telehealth: Payer: Self-pay

## 2017-12-25 ENCOUNTER — Inpatient Hospital Stay (HOSPITAL_BASED_OUTPATIENT_CLINIC_OR_DEPARTMENT_OTHER): Payer: Medicare Other | Admitting: Oncology

## 2017-12-25 VITALS — BP 123/75 | HR 97 | Temp 97.9°F | Resp 17 | Ht 76.0 in | Wt 207.0 lb

## 2017-12-25 DIAGNOSIS — C160 Malignant neoplasm of cardia: Secondary | ICD-10-CM

## 2017-12-25 DIAGNOSIS — Z9049 Acquired absence of other specified parts of digestive tract: Secondary | ICD-10-CM | POA: Diagnosis not present

## 2017-12-25 DIAGNOSIS — I10 Essential (primary) hypertension: Secondary | ICD-10-CM | POA: Diagnosis not present

## 2017-12-25 DIAGNOSIS — Z5111 Encounter for antineoplastic chemotherapy: Secondary | ICD-10-CM | POA: Diagnosis not present

## 2017-12-25 DIAGNOSIS — Z79899 Other long term (current) drug therapy: Secondary | ICD-10-CM | POA: Diagnosis not present

## 2017-12-25 DIAGNOSIS — R4702 Dysphasia: Secondary | ICD-10-CM

## 2017-12-25 DIAGNOSIS — Z7189 Other specified counseling: Secondary | ICD-10-CM | POA: Insufficient documentation

## 2017-12-25 DIAGNOSIS — I739 Peripheral vascular disease, unspecified: Secondary | ICD-10-CM | POA: Diagnosis not present

## 2017-12-25 DIAGNOSIS — C155 Malignant neoplasm of lower third of esophagus: Secondary | ICD-10-CM

## 2017-12-25 DIAGNOSIS — J449 Chronic obstructive pulmonary disease, unspecified: Secondary | ICD-10-CM

## 2017-12-25 DIAGNOSIS — Z452 Encounter for adjustment and management of vascular access device: Secondary | ICD-10-CM | POA: Diagnosis not present

## 2017-12-25 DIAGNOSIS — Z86010 Personal history of colonic polyps: Secondary | ICD-10-CM | POA: Diagnosis not present

## 2017-12-25 DIAGNOSIS — E119 Type 2 diabetes mellitus without complications: Secondary | ICD-10-CM | POA: Diagnosis not present

## 2017-12-25 NOTE — Telephone Encounter (Signed)
Printed avs,  schedule was incomplete due to infusion resources. Added to infusion book per 5/9 los

## 2017-12-25 NOTE — Telephone Encounter (Signed)
Spoke with patient concerning upcoming appointment per 5/9 los. Was unable to schedule on 22nd due to no avalibility in infusion.

## 2017-12-25 NOTE — Progress Notes (Signed)
[facility] OFFICE PROGRESS NOTE   Diagnosis: Esophagus cancer  INTERVAL HISTORY:   Mr. Beranek returns as scheduled.  He continues to have dysphasia.  He is able to tolerate liquids.  He has a cough in the evening.  No other complaint.  Objective:  Vital signs in last 24 hours:  Blood pressure 123/75, pulse 97, temperature 97.9 F (36.6 C), temperature source Oral, resp. rate 17, height 6' 4" (1.93 m), weight 207 lb (93.9 kg), SpO2 98 %.    HEENT: Mobile nodular lesion inferior to the right submandibular gland.  No lower neck mass. Lymphatics: No cervical, supraclavicular, or axillary nodes Resp: Lungs clear bilaterally, distant breath sounds Cardio: Regular rate and rhythm GI: No hepatomegaly, no mass, nontender Vascular: No leg edema   Lab Results:  Lab Results  Component Value Date   WBC 5.0 10/22/2017   HGB 13.5 10/22/2017   HCT 40.6 10/22/2017   MCV 98.1 (H) 10/22/2017   PLT 153 10/22/2017   NEUTROABS 3.1 10/22/2017    CMP     Component Value Date/Time   NA 140 12/23/2017 1102   NA 138 08/05/2017 1547   K 4.0 12/23/2017 1102   K 4.0 08/05/2017 1547   CL 109 12/23/2017 1102   CO2 27 12/23/2017 1102   CO2 27 08/05/2017 1547   GLUCOSE 165 (H) 12/23/2017 1102   GLUCOSE 184 (H) 08/05/2017 1547   GLUCOSE 129 (H) 06/30/2006 0809   BUN 16 12/23/2017 1102   BUN 18.3 08/05/2017 1547   CREATININE 1.04 12/23/2017 1102   CREATININE 1.3 08/05/2017 1547   CALCIUM 9.6 12/23/2017 1102   CALCIUM 9.5 08/05/2017 1547   PROT 8.3 10/22/2017 1045   PROT 8.0 08/05/2017 1547   ALBUMIN 4.1 10/22/2017 1045   ALBUMIN 3.6 08/05/2017 1547   AST 18 10/22/2017 1045   AST 28 08/05/2017 1547   ALT 11 10/22/2017 1045   ALT 27 08/05/2017 1547   ALKPHOS 91 10/22/2017 1045   ALKPHOS 95 08/05/2017 1547   BILITOT 0.6 10/22/2017 1045   BILITOT 0.50 08/05/2017 1547   GFRNONAA >60 12/23/2017 1102   GFRAA >60 12/23/2017 1102    Lab Results  Component Value Date   CEA1 23.92 (H) 08/05/2017    Lab Results  Component Value Date   INR 0.98 08/26/2017    Imaging:  Ct Soft Tissue Neck W Contrast  Result Date: 12/23/2017 CLINICAL DATA:  Re-stage esophageal cancer. History of esophageal cancer at the GE junction with esophagectomy. Recurrent mass. EXAM: CT NECK WITH CONTRAST TECHNIQUE: Multidetector CT imaging of the neck was performed using the standard protocol following the bolus administration of intravenous contrast. CONTRAST:  170m OMNIPAQUE IOHEXOL 300 MG/ML  SOLN COMPARISON:  07/25/2017 FINDINGS: Pharynx and larynx: Vocal cord paralysis on the right with enlargement of the laryngeal ventricle and piriform sinus. Gastric pull-through after esophagectomy with stable masslike appearance centered at the right tracheoesophageal groove extending from the suture margin laterally to the posterior clavicle, encasing the right common carotid and broadly contacting the proximal right subclavian. Due to irregular shape, reproducible measurement is challenging, there is stable mass effect and extent of abnormality. Dystrophic calcification or disorganized periosteal reaction posterior to the right clavicle has mildly increased in extent, of indeterminate significance. Bone itself does not appear eroded. Salivary glands: Right submandibular gland mass with heterogeneous enhancement, 12 mm in diameter. Thyroid: The thoracic inlet mass contacts the lower pole right thyroid without clear invasion. Lymph nodes: None enlarged or abnormal density Vascular:  Right common carotid encasement as noted below. Moderate to extensive atherosclerotic plaque at the common carotid bifurcations. Limited intracranial: Negative Visualized orbits: Negative Mastoids and visualized paranasal sinuses: Clear Skeleton: Rounded sclerotic foci within the C7 anterior inferior corner and T1 posterosuperior corner, consistent with interval metastases. Upper chest: Reported separately IMPRESSION: 1. Unchanged  infiltrative mass at the right thoracic inlet, encasing the common carotid artery and broadly contacting the proximal right subclavian. Dystrophic calcification or disorganized periosteal reaction has mildly increased along the neighboring deep right clavicle, of indeterminate significance. 2. Two new sclerotic foci in the T1 and T2 vertebrae consistent with interval bony metastases. 3. Known vocal cord paralysis on the right. 4. Known 12 mm right submandibular mass. 5. Chest CT reported separately. Electronically Signed   By: Monte Fantasia M.D.   On: 12/23/2017 16:03   Ct Chest W Contrast  Result Date: 12/24/2017 CLINICAL DATA:  GE junction cancer with recurrence. EXAM: CT CHEST WITH CONTRAST TECHNIQUE: Multidetector CT imaging of the chest was performed during intravenous contrast administration. CONTRAST:  179m OMNIPAQUE IOHEXOL 300 MG/ML  SOLN COMPARISON:  PET-CT 09/11/2017.  Chest CT 08/06/2017. FINDINGS: Cardiovascular: The heart size is normal. No pericardial effusion. Coronary artery calcification is evident. Atherosclerotic calcification is noted in the wall of the thoracic aorta. Mediastinum/Nodes: The poorly marginated soft tissue mass in the right thoracic inlet is again noted measuring 4.9 x 4.9 cm today. Reproducible measurement is difficult given the irregular lesions of the margin and slightly different slice collimation. This lesion generates mass-effect on the right thyroid lobe and circumferentially encases and attenuates the right common carotid artery (see image 7 / series 3), similar to prior. As noted on the previous PET-CT, this lesion involves the anastomosis. Sequelae of gastric pull-through procedure again noted. There is no hilar lymphadenopathy. There is no axillary lymphadenopathy. Lungs/Pleura: The central tracheobronchial airways are patent. 7 mm nodule posterior right upper lobe is stable. 5 mm peripheral right upper lobe nodule (53/6) appears slightly progressed in the  interval. New ill-defined nodular densities are noted in the right upper lobe (images 39, 43, and 47/series 6). 8 x 17 mm nodule at the left lung base is stable. Tiny right pleural effusion is new in the interval. Upper Abdomen: Multiple bilateral renal cysts are similar to prior. Musculoskeletal: A new 15 mm sclerotic lesion in the posterior aspect of the T1 vertebral body is compatible with metastatic disease. There is a tiny 8 mm sclerotic lesion in the C7 vertebral body. IMPRESSION: 1. Abnormal soft tissue in the right thoracic inlet appears more confluent than previous diagnostic CT chest of 08/06/2017. As before, this circumferentially encases and narrows the proximal right common carotid artery. 2. Interval development of sclerotic lesions in the C7 and T1 vertebral bodies since PET-CT of 09/11/2017, highly suspicious for metastatic disease. 3. Bilateral pulmonary nodules as before. Several new ill-defined nodular opacities are seen in the right lung. These may reflect areas of infectious/inflammatory disease although new lung metastases cannot be excluded. Close attention on follow-up recommended. 4. New tiny right pleural effusion. Electronically Signed   By: EMisty StanleyM.D.   On: 12/24/2017 10:51   CT images reviewed Medications: I have reviewed the patient's current medications.   Assessment/Plan: 1. 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 EUS 03/16/2015 confirmed a clinical stage IIb (uT3,uN1) tumor ? Staging CTs of the chest, abdomen, and pelvis with no evidence of metastatic disease ? Initiation of radiation 04/19/2015, cycle 1 Taxol/carboplatin 04/20/2015: Radiation completed  05/29/2015; the fifth and final week of Taxol/carboplatin 05/18/2015 ? Restaging CTs 07/20/2015 revealed no evidence of metastatic disease, incidental left lower lobe pulmonary embolism ? Transhiatal total esophagectomy 07/25/2015 confirmed a ypT3, ypN2 with 4/5 positive  lymph nodes, lymphovascular invasion, perineural invasion, negative resection margins ? PD1 score-0 ? CT neck 07/25/2017-4-5 cm mass in the right lower neck upper mediastinum immediately adjacent to the esophageal anastomosis. 12 mm mass within the right parotid enlarged since the previous PET study where it was about half that size. Question few small pulmonary nodules. Borderline enlarged mediastinal lymph nodes. ? CTs brain/chest/abdomen/pelvis 08/06/2017-ill-defined mass at the right thoracic inlet extending into the right superior mediastinum; new bilateral but right much greater than left peripheral reticulonodular and groundglass opacities; scattered small paratracheal and anterior mediastinal lymph nodes; small bilateral axillary lymph nodes. Brain CT with no evidence of metastasis. ? 08/26/2017 biopsy right supraclavicular lymph node-very scant fragment of benign lymph node. ? PET scan 09/11/2017-poorly marginated heterogeneously hypermetabolic soft tissue mass at the right thoracic inlet extending laterally from the esophagogastric anastomosis in the neck. No definite hypermetabolic metastatic disease.Patchy tree-in-bud opacities throughout the lungs significantly decreased. Persistent scattered subcentimeter solid pulmonary nodules in both lungs stable to decreased. Stable irregular 1.7 cm nodular bandlike opacity anterior left lower lobe with associated low-level metabolism. Nonspecific mildly hypermetabolic subcarinal and bilateral hilar lymph nodes. Nonspecific mildly hypermetabolic nodular focus of soft tissue in the upper left retroperitoneum anterior to the adrenal gland. ? Upper endoscopy 09/15/2017-at 20 cm from the incisors tight stricture at the anastomosis measuring only a few millimeters in diameter. Surrounding tissues firm, slightly friable, noncompliant. Stricture was dilated with a 15 mm balloon several times. Diameter of stricture improved but still would not permit passage  of a standard upper endoscope. Friable areas biopsied-invasive adenocarcinoma. ? Initiation of radiation and concurrent capecitabine 09/22/2017 completed 10/30/2017 ? CTs neck and chest 12/23/2017- stable abnormal soft tissue at the right thoracic inlet with encasement of the proximal right common carotid artery, interval development of sclerotic lesions at C7 and T1, new indeterminate right lung opacities  2. Postprandial subxiphoid pain secondary to #1  3. Diabetes-elevated blood sugar readings at the [facility] and at home  4. COPD  5. Depression-Improved with Wellbutrin  6. Hypertension  7. History of adenomatous colon polyps  8. History of diabetic related neuropathy, peripheral arterial disease, and nephropathy  9. History of nausea-likely related to radiation esophagitis/gastritis  10. Incidental pulmonary embolism noted on the CT 07/20/2015, treated with Xarelto  11. Admission 08/18/2015 with GI bleeding while on Xarelto, anticoagulation therapy discontinued  12. Dysphagia-improved with esophageal dilatation procedures by Dr. Henrene Pastor, progressive dysphagia secondary to local recurrence of esophagus cancer with obstruction at the esophagus anastomosis  13.History ofmild thrombocytopenia  14. CT scan abdomen/pelvis 10/09/2016 (done to evaluate persistent nausea, epigastric pain)-no acute findings or explanation for the patient's symptoms. Resolving postsurgical changes from presumed previous esophagectomy and gastric pull-through. No evidence of metastatic disease.   Disposition: Mr. Heid has persistent dysphasia.  There is a persistent mass at the thoracic inlet.  The mass is likely causing obstruction of the esophagus.  Mr. Gallon did not experience improvement with a course of radiation/Xeloda.  I discussed treatment options with Mr. Millea and his wife.  I recommend a trial of systemic chemotherapy.  He received Taxol/carboplatin in the  neoadjuvant setting.  The PD1 score on the 2016 tumor returned at 0.  He is not an ideal candidate for immunotherapy.  We will be sure  the tumor has been submitted for MSI and HER-2 testing.  I recommend FOLFOX chemotherapy. We reviewed the potential toxicities associated with the FOLFOX regimen including the chance of allergic reaction and various types of neuropathy associated with oxaliplatin.  We discussed the mouth sores, diarrhea, hand/foot syndrome, skin rash, sun sensitivity, and hyperpigmentation associated with 5-fluorouracil.  We discussed the chance for nausea/vomiting, alopecia, and hematologic toxicity.  He agrees to proceed.  Mr. Durbin will be referred for placement of a Port-A-Cath with the plan to begin FOLFOX chemotherapy on 01/08/2018.  40 minutes were spent with the patient today.  The majority of the time was used for counseling and coordination of care.  Betsy Coder, MD  12/25/2017  8:14 AM

## 2017-12-25 NOTE — Progress Notes (Signed)
START ON PATHWAY REGIMEN - Gastroesophageal     A cycle is every 14 days:     Oxaliplatin      Leucovorin      5-Fluorouracil      5-Fluorouracil   **Always confirm dose/schedule in your pharmacy ordering system**    Patient Characteristics: Distant Metastases (cM1/pM1) / Locally Recurrent Disease, Adenocarcinoma - Esophageal, GE Junction, and Gastric, First Line, HER2 Negative / Unknown Histology: Adenocarcinoma Disease Classification: GE Junction Therapeutic Status: Distant Metastases (No Additional Staging) Line of Therapy: First Line HER2 Status: Awaiting Test Results Intent of Therapy: Non-Curative / Palliative Intent, Discussed with Patient 

## 2017-12-29 ENCOUNTER — Telehealth: Payer: Self-pay | Admitting: *Deleted

## 2017-12-29 NOTE — Telephone Encounter (Signed)
Spoke with Cherlyn Cushing in pathology at [facility]: requested Her2 and MSI testing on [phone].

## 2017-12-30 ENCOUNTER — Encounter (HOSPITAL_COMMUNITY): Payer: Self-pay | Admitting: Oncology

## 2017-12-30 DIAGNOSIS — J3801 Paralysis of vocal cords and larynx, unilateral: Secondary | ICD-10-CM | POA: Diagnosis not present

## 2017-12-30 DIAGNOSIS — C159 Malignant neoplasm of esophagus, unspecified: Secondary | ICD-10-CM | POA: Diagnosis not present

## 2017-12-30 DIAGNOSIS — Z87891 Personal history of nicotine dependence: Secondary | ICD-10-CM | POA: Diagnosis not present

## 2017-12-31 DIAGNOSIS — M799 Soft tissue disorder, unspecified: Secondary | ICD-10-CM | POA: Diagnosis not present

## 2017-12-31 DIAGNOSIS — C159 Malignant neoplasm of esophagus, unspecified: Secondary | ICD-10-CM | POA: Diagnosis not present

## 2018-01-02 ENCOUNTER — Other Ambulatory Visit: Payer: Self-pay | Admitting: Radiology

## 2018-01-04 ENCOUNTER — Other Ambulatory Visit: Payer: Self-pay | Admitting: Oncology

## 2018-01-05 ENCOUNTER — Encounter (HOSPITAL_COMMUNITY): Payer: Self-pay

## 2018-01-05 ENCOUNTER — Ambulatory Visit (HOSPITAL_COMMUNITY)
Admission: RE | Admit: 2018-01-05 | Discharge: 2018-01-05 | Disposition: A | Discharge status: Home / Self Care | Payer: Medicare Other | Source: Ambulatory Visit | Attending: Oncology | Admitting: Oncology

## 2018-01-05 ENCOUNTER — Other Ambulatory Visit: Payer: Self-pay | Admitting: Oncology

## 2018-01-05 DIAGNOSIS — F419 Anxiety disorder, unspecified: Secondary | ICD-10-CM | POA: Diagnosis not present

## 2018-01-05 DIAGNOSIS — E119 Type 2 diabetes mellitus without complications: Secondary | ICD-10-CM | POA: Diagnosis not present

## 2018-01-05 DIAGNOSIS — F329 Major depressive disorder, single episode, unspecified: Secondary | ICD-10-CM | POA: Insufficient documentation

## 2018-01-05 DIAGNOSIS — C153 Malignant neoplasm of upper third of esophagus: Secondary | ICD-10-CM | POA: Diagnosis not present

## 2018-01-05 DIAGNOSIS — E7800 Pure hypercholesterolemia, unspecified: Secondary | ICD-10-CM | POA: Diagnosis not present

## 2018-01-05 DIAGNOSIS — J449 Chronic obstructive pulmonary disease, unspecified: Secondary | ICD-10-CM | POA: Diagnosis not present

## 2018-01-05 DIAGNOSIS — K219 Gastro-esophageal reflux disease without esophagitis: Secondary | ICD-10-CM | POA: Insufficient documentation

## 2018-01-05 DIAGNOSIS — Z87891 Personal history of nicotine dependence: Secondary | ICD-10-CM | POA: Diagnosis not present

## 2018-01-05 DIAGNOSIS — I10 Essential (primary) hypertension: Secondary | ICD-10-CM | POA: Insufficient documentation

## 2018-01-05 DIAGNOSIS — C155 Malignant neoplasm of lower third of esophagus: Secondary | ICD-10-CM

## 2018-01-05 DIAGNOSIS — Z794 Long term (current) use of insulin: Secondary | ICD-10-CM | POA: Insufficient documentation

## 2018-01-05 DIAGNOSIS — C159 Malignant neoplasm of esophagus, unspecified: Secondary | ICD-10-CM | POA: Diagnosis not present

## 2018-01-05 DIAGNOSIS — Z5111 Encounter for antineoplastic chemotherapy: Secondary | ICD-10-CM | POA: Diagnosis not present

## 2018-01-05 HISTORY — PX: IR US GUIDE VASC ACCESS LEFT: IMG2389

## 2018-01-05 HISTORY — PX: IR IMAGING GUIDED PORT INSERTION: IMG5740

## 2018-01-05 LAB — PROTIME-INR
INR: 0.97
PROTHROMBIN TIME: 12.8 s (ref 11.4–15.2)

## 2018-01-05 LAB — CBC WITH DIFFERENTIAL/PLATELET
BASOS ABS: 0 10*3/uL (ref 0.0–0.1)
BASOS PCT: 0 %
EOS ABS: 0.3 10*3/uL (ref 0.0–0.7)
Eosinophils Relative: 4 %
HEMATOCRIT: 38.6 % — AB (ref 39.0–52.0)
HEMOGLOBIN: 12.9 g/dL — AB (ref 13.0–17.0)
Lymphocytes Relative: 36 %
Lymphs Abs: 2.6 10*3/uL (ref 0.7–4.0)
MCH: 33.7 pg (ref 26.0–34.0)
MCHC: 33.4 g/dL (ref 30.0–36.0)
MCV: 100.8 fL — ABNORMAL HIGH (ref 78.0–100.0)
Monocytes Absolute: 0.3 10*3/uL (ref 0.1–1.0)
Monocytes Relative: 5 %
NEUTROS PCT: 55 %
Neutro Abs: 4 10*3/uL (ref 1.7–7.7)
Platelets: 161 10*3/uL (ref 150–400)
RBC: 3.83 MIL/uL — ABNORMAL LOW (ref 4.22–5.81)
RDW: 13.8 % (ref 11.5–15.5)
WBC: 7.2 10*3/uL (ref 4.0–10.5)

## 2018-01-05 LAB — GLUCOSE, CAPILLARY
GLUCOSE-CAPILLARY: 59 mg/dL — AB (ref 65–99)
GLUCOSE-CAPILLARY: 97 mg/dL (ref 65–99)
GLUCOSE-CAPILLARY: 109 mg/dL — AB (ref 65–99)
Glucose-Capillary: 77 mg/dL (ref 65–99)

## 2018-01-05 MED ORDER — MIDAZOLAM HCL 2 MG/2ML IJ SOLN
INTRAMUSCULAR | Status: AC | PRN
  Administered 2018-01-05 (×3): 1 mg via INTRAVENOUS

## 2018-01-05 MED ORDER — HEPARIN SOD (PORK) LOCK FLUSH 100 UNIT/ML IV SOLN
INTRAVENOUS | Status: AC
  Filled 2018-01-05: qty 5

## 2018-01-05 MED ORDER — LIDOCAINE-EPINEPHRINE (PF) 2 %-1:200000 IJ SOLN
INTRAMUSCULAR | Status: AC
  Filled 2018-01-05: qty 20

## 2018-01-05 MED ORDER — DEXTROSE 50 % IV SOLN
INTRAVENOUS | Status: AC
  Administered 2018-01-05: 25 mL
  Filled 2018-01-05: qty 50

## 2018-01-05 MED ORDER — LIDOCAINE-EPINEPHRINE (PF) 2 %-1:200000 IJ SOLN
INTRAMUSCULAR | Status: AC | PRN
  Administered 2018-01-05: 20 mL
  Administered 2018-01-05: 5 mL

## 2018-01-05 MED ORDER — CEFAZOLIN SODIUM-DEXTROSE 2-4 GM/100ML-% IV SOLN
INTRAVENOUS | Status: AC
  Administered 2018-01-05: 2 g via INTRAVENOUS
  Filled 2018-01-05: qty 100

## 2018-01-05 MED ORDER — SODIUM CHLORIDE 0.9 % IV SOLN
INTRAVENOUS | Status: DC
  Administered 2018-01-05: 13:00:00 via INTRAVENOUS

## 2018-01-05 MED ORDER — FENTANYL CITRATE (PF) 100 MCG/2ML IJ SOLN
INTRAMUSCULAR | Status: AC | PRN
  Administered 2018-01-05 (×2): 50 ug via INTRAVENOUS

## 2018-01-05 MED ORDER — MIDAZOLAM HCL 2 MG/2ML IJ SOLN
INTRAMUSCULAR | Status: AC
  Filled 2018-01-05: qty 4

## 2018-01-05 MED ORDER — FENTANYL CITRATE (PF) 100 MCG/2ML IJ SOLN
INTRAMUSCULAR | Status: AC
  Filled 2018-01-05: qty 2

## 2018-01-05 MED ORDER — DEXTROSE 50 % IV SOLN: 25.0000 mL | Freq: Once | INTRAVENOUS | Status: DC

## 2018-01-05 MED ORDER — CEFAZOLIN SODIUM-DEXTROSE 2-4 GM/100ML-% IV SOLN
2.0000 g | INTRAVENOUS | Status: AC
  Administered 2018-01-05: 2 g via INTRAVENOUS

## 2018-01-05 MED ORDER — HEPARIN SOD (PORK) LOCK FLUSH 100 UNIT/ML IV SOLN
INTRAVENOUS | Status: AC | PRN
  Administered 2018-01-05: 500 [IU] via INTRAVENOUS

## 2018-01-05 NOTE — Procedures (Signed)
Pre Procedure Dx: Esophageal Cancer Post Procedural Dx: Same  Successful placement of left IJ approach port-a-cath with tip at the superior caval atrial junction. The catheter is ready for immediate use.  Estimated Blood Loss: Minimal  Complications: None immediate.  Ronny Bacon, MD Pager #: [phone]

## 2018-01-05 NOTE — Discharge Instructions (Signed)
Implanted Port Insertion, Care After °This sheet gives you information about how to care for yourself after your procedure. Your health care provider may also give you more specific instructions. If you have problems or questions, contact your health care provider. °What can I expect after the procedure? °After your procedure, it is common to have: °· Discomfort at the port insertion site. °· Bruising on the skin over the port. This should improve over 3-4 days. ° °Follow these instructions at home: °Port care °· After your port is placed, you will get a manufacturer's information card. The card has information about your port. Keep this card with you at all times. °· Take care of the port as told by your health care provider. Ask your health care provider if you or a family member can get training for taking care of the port at home. A home health care nurse may also take care of the port. °· Make sure to remember what type of port you have. °Incision care °· Follow instructions from your health care provider about how to take care of your port insertion site. Make sure you: °? Wash your hands with soap and water before you change your bandage (dressing). If soap and water are not available, use hand sanitizer. °? Change your dressing as told by your health care provider. °? Leave stitches (sutures), skin glue, or adhesive strips in place. These skin closures may need to stay in place for 2 weeks or longer. If adhesive strip edges start to loosen and curl up, you may trim the loose edges. Do not remove adhesive strips completely unless your health care provider tells you to do that. °· Check your port insertion site every day for signs of infection. Check for: °? More redness, swelling, or pain. °? More fluid or blood. °? Warmth. °? Pus or a bad smell. °General instructions °· Do not take baths, swim, or use a hot tub until your health care provider approves. °· Do not lift anything that is heavier than 10 lb (4.5  kg) for a week, or as told by your health care provider. °· Ask your health care provider when it is okay to: °? Return to work or school. °? Resume usual physical activities or sports. °· Do not drive for 24 hours if you were given a medicine to help you relax (sedative). °· Take over-the-counter and prescription medicines only as told by your health care provider. °· Wear a medical alert bracelet in case of an emergency. This will tell any health care providers that you have a port. °· Keep all follow-up visits as told by your health care provider. This is important. °Contact a health care provider if: °· You cannot flush your port with saline as directed, or you cannot draw blood from the port. °· You have a fever or chills. °· You have more redness, swelling, or pain around your port insertion site. °· You have more fluid or blood coming from your port insertion site. °· Your port insertion site feels warm to the touch. °· You have pus or a bad smell coming from the port insertion site. °Get help right away if: °· You have chest pain or shortness of breath. °· You have bleeding from your port that you cannot control. °Summary °· Take care of the port as told by your health care provider. °· Change your dressing as told by your health care provider. °· Keep all follow-up visits as told by your health care provider. °  This information is not intended to replace advice given to you by your health care provider. Make sure you discuss any questions you have with your health care provider. °Document Released: 05/26/2013 Document Revised: 06/26/2016 Document Reviewed: 06/26/2016 °Elsevier Interactive Patient Education © 2017 [employer] °Moderate Conscious Sedation, Adult, Care After °These instructions provide you with information about caring for yourself after your procedure. Your health care provider may also give you more specific instructions. Your treatment has been planned according to current medical  practices, but problems sometimes occur. Call your health care provider if you have any problems or questions after your procedure. °What can I expect after the procedure? °After your procedure, it is common: °· To feel sleepy for several hours. °· To feel clumsy and have poor balance for several hours. °· To have poor judgment for several hours. °· To vomit if you eat too soon. ° °Follow these instructions at home: °For at least 24 hours after the procedure: ° °· Do not: °? Participate in activities where you could fall or become injured. °? Drive. °? Use heavy machinery. °? Drink alcohol. °? Take sleeping pills or medicines that cause drowsiness. °? Make important decisions or sign legal documents. °? Take care of children on your own. °· Rest. °Eating and drinking °· Follow the diet recommended by your health care provider. °· If you vomit: °? Drink water, juice, or soup when you can drink without vomiting. °? Make sure you have little or no nausea before eating solid foods. °General instructions °· Have a responsible adult stay with you until you are awake and alert. °· Take over-the-counter and prescription medicines only as told by your health care provider. °· If you smoke, do not smoke without supervision. °· Keep all follow-up visits as told by your health care provider. This is important. °Contact a health care provider if: °· You keep feeling nauseous or you keep vomiting. °· You feel light-headed. °· You develop a rash. °· You have a fever. °Get help right away if: °· You have trouble breathing. °This information is not intended to replace advice given to you by your health care provider. Make sure you discuss any questions you have with your health care provider. °Document Released: 05/26/2013 Document Revised: 01/08/2016 Document Reviewed: 11/25/2015 °Elsevier Interactive Patient Education © 2018 [employer] ° °

## 2018-01-05 NOTE — H&P (Signed)
Chief Complaint: Patient was seen in consultation today for port placement at the request of Sherrill,Gary B  Referring Physician(s): Ladell Pier  Supervising Physician: Sandi Mariscal  Patient Status: [facility] - Out-pt  History of Present Illness: Ronald Johnson is a 72 y.o. male with esophageal cancer. He is to start chemotherapy later this week and is referred for port placement. PMHx, meds, labs, imaging, allergies reviewed. Feels well, no recent fevers, chills, illness. Has been NPO today as directed. Family at bedside.   Past Medical History:  Diagnosis Date  . Anemia 08/15/2009  . Anxiety   . Arthritis   . [name]'s esophagus 2007  . COLONIC POLYPS, ADENOMATOUS 08/01/2008, 2013, 2014  . COPD (chronic obstructive pulmonary disease) (HCC)    no per pt  . Depression   . Diabetes type 2, controlled ([place]) 03/13/2007  . Diverticulitis   . ED (erectile dysfunction)   . EMPHYSEMA 11/08/2008   no per pt  . Esophageal cancer ([place]) "dx'd ~ 01/2015"  . GERD 03/13/2007  . GIB (gastrointestinal bleeding) 08/18/2015  . Hiatal hernia   . History of blood transfusion 08/18/2015   due to GIB  . HYPERCHOLESTEROLEMIA 02/03/2008  . HYPERTENSION 03/13/2007   pt denies, claims white coat syndrome  . Prostatism   . PULMONARY NODULE 11/24/2008  . Stomach cancer ([place]) "dx'd ~ 01/2015"  . TOBACCO ABUSE 11/24/2008    Past Surgical History:  Procedure Laterality Date  . BALLOON DILATION N/A 03/19/2016   Procedure: BALLOON DILATION;  Surgeon: Irene Shipper, MD;  Location: [facility];  Service: Endoscopy;  Laterality: N/A;  . CIRCUMCISION    . COLONOSCOPY    . COMPLETE ESOPHAGECTOMY N/A 07/25/2015   Procedure: TRANSHIATAL TOTAL ESOPHAGECTOMY COMPLETE PYLOROMYOTOMY;  Surgeon: [name], MD;  Location: [place];  Service: Thoracic;  Laterality: N/A;  . EGD  11/19/2005  . ESOPHAGOGASTRODUODENOSCOPY N/A 03/19/2016   Procedure: ESOPHAGOGASTRODUODENOSCOPY (EGD);  Surgeon: Irene Shipper, MD;   Location: [facility];  Service: Endoscopy;  Laterality: N/A;  . ESOPHAGOGASTRODUODENOSCOPY (EGD) WITH PROPOFOL N/A 09/15/2017   Procedure: ESOPHAGOGASTRODUODENOSCOPY (EGD) WITH PROPOFOL;  Surgeon: Irene Shipper, MD;  Location: [facility];  Service: Endoscopy;  Laterality: N/A;  . EUS N/A 03/16/2015   Procedure: UPPER ENDOSCOPIC ULTRASOUND (EUS) RADIAL;  Surgeon: Milus Banister, MD;  Location: [facility];  Service: Endoscopy;  Laterality: N/A;  . FOOT FRACTURE SURGERY Right 1980   right foot w/ pins and screws  . JEJUNOSTOMY N/A 07/25/2015   Procedure: FEEDING JEJUNOSTOMY;  Surgeon: [name], MD;  Location: [place];  Service: Thoracic;  Laterality: N/A;  . POLYPECTOMY    . removal pins and screws foot  1980   right foot  . UPPER GASTROINTESTINAL ENDOSCOPY    . VASECTOMY    . VIDEO BRONCHOSCOPY N/A 07/25/2015   Procedure: VIDEO BRONCHOSCOPY;  Surgeon: [name], MD;  Location: [facility] OR;  Service: Thoracic;  Laterality: N/A;    Allergies: Patient has no known allergies.  Medications: Prior to Admission medications   Medication Sig Start Date End Date Taking? Authorizing Provider  atorvastatin (LIPITOR) 10 MG tablet Take 1 tablet (10 mg total) by mouth daily. Patient taking differently: Take 10 mg by mouth every evening.  08/28/17  Yes [name], MD  escitalopram (LEXAPRO) 20 MG tablet Take 1 tablet (20 mg total) by mouth daily. 10/14/17  Yes [name], MD  glucose blood (PRECISION XTRA TEST STRIPS) test strip Check blood sugar three times a day dx  250.01 06/17/14  Yes [name], MD  insulin NPH-regular Human (HUMULIN 70/30) (70-30) 100 UNIT/ML injection Inject 30 Units into the skin daily with breakfast. Patient taking differently: Inject 30 Units into the skin daily with breakfast.  11/20/17  Yes [name], MD  Insulin Syringe-Needle U-100 30G X 3/8" 1 ML MISC Use to inject insulin 3 times daily. 06/06/15  Yes [name], MD  RABEprazole (ACIPHEX) 20 MG  tablet TAKE 1 TABLET(20 MG) BY MOUTH TWICE DAILY 12/24/17  Yes Irene Shipper, MD  topiramate (TOPAMAX) 50 MG tablet 2 in the AM, 1 at night 11/20/17  Yes Tat, Wells Guiles S, DO  glucagon (GLUCAGON EMERGENCY) 1 MG injection Inject 1 mg into the vein once as needed for up to 1 dose. 11/20/17   [name], MD  ibuprofen (ADVIL,MOTRIN) 200 MG tablet Take 400 mg by mouth every 6 (six) hours as needed for headache or moderate pain.     [provider]  oxyCODONE (OXY IR/ROXICODONE) 5 MG immediate release tablet Take 1 tablet (5 mg total) by mouth daily as needed for severe pain. 11/25/17   Ladell Pier, MD  sildenafil (REVATIO) 20 MG tablet 1-5 tabs as needed for ED symptoms Patient taking differently: Take 20-100 mg by mouth as needed (for ED).  03/03/17   [name], MD  sucralfate (CARAFATE) 1 g tablet Take 1 tablet (1 g total) by mouth 4 (four) times daily -  with meals and at bedtime. Crush and mix in 1 oz of water to take 08/21/17   Hayden Pedro, PA-C  metoprolol succinate (TOPROL-XL) 25 MG 24 hr tablet Take 25 mg by mouth daily.  06/17/11 11/01/11  [provider]     Family History  Problem Relation Age of Onset  . Stroke Mother   . Diabetes Mother   . Diabetes Maternal Grandmother        mother side of the family aunts, MGF  . Breast cancer Paternal Grandmother   . AAA (abdominal aortic aneurysm) Father   . Colon cancer Neg Hx   . Esophageal cancer Neg Hx   . Rectal cancer Neg Hx   . Stomach cancer Neg Hx     Social History   Socioeconomic History  . Marital status: Married    Spouse name: Not on file  . Number of children: 2  . Years of education: Not on file  . Highest education level: Not on file  Occupational History  . Occupation: retired    Fish farm manager: [employer]    Comment: works Education administrator  . Financial resource strain: Not on file  . Food insecurity:    Worry: Not on file    Inability: Not on file  .  Transportation needs:    Medical: Not on file    Non-medical: Not on file  Tobacco Use  . Smoking status: Former Smoker    Packs/day: 1.00    Years: 35.00    Pack years: 35.00    Types: Cigarettes    Last attempt to quit: 10/31/2008    Years since quitting: 9.1  . Smokeless tobacco: Never Used  Substance and Sexual Activity  . Alcohol use: No    Alcohol/week: 0.0 oz  . Drug use: No  . Sexual activity: Not Currently    Partners: Female  Lifestyle  . Physical activity:    Days per week: Not on file    Minutes per session: Not on file  . Stress: Not  on file  Relationships  . Social connections:    Talks on phone: Not on file    Gets together: Not on file    Attends religious service: Not on file    Active member of club or organization: Not on file    Attends meetings of clubs or organizations: Not on file    Relationship status: Not on file  Other Topics Concern  . Not on file  Social History Narrative   Married, wife Thayer with #2 grown children   Prior [employer], where he met his wife in [place]   Retired-prior work Personnel officer ADL's     Review of Systems: A 12 point ROS discussed and pertinent positives are indicated in the HPI above.  All other systems are negative.  Review of Systems  Vital Signs: BP (!) 147/93   Pulse 100   Temp 97.7 F (36.5 C) (Oral)   Resp 18   Ht '6\' 4"'$  (1.93 m)   Wt 207 lb (93.9 kg)   SpO2 97%   BMI 25.20 kg/m   Physical Exam  Constitutional: He is oriented to person, place, and time. He appears well-developed. No distress.  HENT:  Head: Normocephalic.  Mouth/Throat: Oropharynx is clear and moist.  Neck: Normal range of motion. No JVD present.  Cardiovascular: Normal rate, regular rhythm and normal heart sounds.  Pulmonary/Chest: Effort normal and breath sounds normal. No respiratory distress.  Neurological: He is alert and oriented to person, place, and time.  Skin: Skin is warm and dry.  Psychiatric: He  has a normal mood and affect. Judgment normal.    Imaging: Ct Soft Tissue Neck W Contrast  Result Date: 12/23/2017 CLINICAL DATA:  Re-stage esophageal cancer. History of esophageal cancer at the GE junction with esophagectomy. Recurrent mass. EXAM: CT NECK WITH CONTRAST TECHNIQUE: Multidetector CT imaging of the neck was performed using the standard protocol following the bolus administration of intravenous contrast. CONTRAST:  162m OMNIPAQUE IOHEXOL 300 MG/ML  SOLN COMPARISON:  07/25/2017 FINDINGS: Pharynx and larynx: Vocal cord paralysis on the right with enlargement of the laryngeal ventricle and piriform sinus. Gastric pull-through after esophagectomy with stable masslike appearance centered at the right tracheoesophageal groove extending from the suture margin laterally to the posterior clavicle, encasing the right common carotid and broadly contacting the proximal right subclavian. Due to irregular shape, reproducible measurement is challenging, there is stable mass effect and extent of abnormality. Dystrophic calcification or disorganized periosteal reaction posterior to the right clavicle has mildly increased in extent, of indeterminate significance. Bone itself does not appear eroded. Salivary glands: Right submandibular gland mass with heterogeneous enhancement, 12 mm in diameter. Thyroid: The thoracic inlet mass contacts the lower pole right thyroid without clear invasion. Lymph nodes: None enlarged or abnormal density Vascular: Right common carotid encasement as noted below. Moderate to extensive atherosclerotic plaque at the common carotid bifurcations. Limited intracranial: Negative Visualized orbits: Negative Mastoids and visualized paranasal sinuses: Clear Skeleton: Rounded sclerotic foci within the C7 anterior inferior corner and T1 posterosuperior corner, consistent with interval metastases. Upper chest: Reported separately IMPRESSION: 1. Unchanged infiltrative mass at the right thoracic  inlet, encasing the common carotid artery and broadly contacting the proximal right subclavian. Dystrophic calcification or disorganized periosteal reaction has mildly increased along the neighboring deep right clavicle, of indeterminate significance. 2. Two new sclerotic foci in the T1 and T2 vertebrae consistent with interval bony metastases. 3. Known vocal cord paralysis on the right. 4. Known 12 mm right  submandibular mass. 5. Chest CT reported separately. Electronically Signed   By: Monte Fantasia M.D.   On: 12/23/2017 16:03   Ct Chest W Contrast  Result Date: 12/24/2017 CLINICAL DATA:  GE junction cancer with recurrence. EXAM: CT CHEST WITH CONTRAST TECHNIQUE: Multidetector CT imaging of the chest was performed during intravenous contrast administration. CONTRAST:  189m OMNIPAQUE IOHEXOL 300 MG/ML  SOLN COMPARISON:  PET-CT 09/11/2017.  Chest CT 08/06/2017. FINDINGS: Cardiovascular: The heart size is normal. No pericardial effusion. Coronary artery calcification is evident. Atherosclerotic calcification is noted in the wall of the thoracic aorta. Mediastinum/Nodes: The poorly marginated soft tissue mass in the right thoracic inlet is again noted measuring 4.9 x 4.9 cm today. Reproducible measurement is difficult given the irregular lesions of the margin and slightly different slice collimation. This lesion generates mass-effect on the right thyroid lobe and circumferentially encases and attenuates the right common carotid artery (see image 7 / series 3), similar to prior. As noted on the previous PET-CT, this lesion involves the anastomosis. Sequelae of gastric pull-through procedure again noted. There is no hilar lymphadenopathy. There is no axillary lymphadenopathy. Lungs/Pleura: The central tracheobronchial airways are patent. 7 mm nodule posterior right upper lobe is stable. 5 mm peripheral right upper lobe nodule (53/6) appears slightly progressed in the interval. New ill-defined nodular densities are  noted in the right upper lobe (images 39, 43, and 47/series 6). 8 x 17 mm nodule at the left lung base is stable. Tiny right pleural effusion is new in the interval. Upper Abdomen: Multiple bilateral renal cysts are similar to prior. Musculoskeletal: A new 15 mm sclerotic lesion in the posterior aspect of the T1 vertebral body is compatible with metastatic disease. There is a tiny 8 mm sclerotic lesion in the C7 vertebral body. IMPRESSION: 1. Abnormal soft tissue in the right thoracic inlet appears more confluent than previous diagnostic CT chest of 08/06/2017. As before, this circumferentially encases and narrows the proximal right common carotid artery. 2. Interval development of sclerotic lesions in the C7 and T1 vertebral bodies since PET-CT of 09/11/2017, highly suspicious for metastatic disease. 3. Bilateral pulmonary nodules as before. Several new ill-defined nodular opacities are seen in the right lung. These may reflect areas of infectious/inflammatory disease although new lung metastases cannot be excluded. Close attention on follow-up recommended. 4. New tiny right pleural effusion. Electronically Signed   By: EMisty StanleyM.D.   On: 12/24/2017 10:51    Labs:  CBC: Recent Labs    10/02/17 0942 10/13/17 0830 10/22/17 1045 01/05/18 1240  WBC 4.7 4.1 5.0 7.2  HGB 13.9 13.0 13.5 12.9*  HCT 42.0 38.8 40.6 38.6*  PLT 153 134* 153 161    COAGS: Recent Labs    08/26/17 1127 01/05/18 1240  INR 0.98 0.97    BMP: Recent Labs    08/05/17 1547 10/02/17 0942 10/22/17 1045 12/23/17 1102  NA 138 136 140 140  K 4.0 4.6 4.0 4.0  CL  --  97* 104 109  CO2 '27 27 25 27  '$ GLUCOSE 184* 290* 124 165*  BUN 18.'3 18 20 16  '$ CALCIUM 9.5 9.7 10.0 9.6  CREATININE 1.3 1.24 1.38* 1.04  GFRNONAA  --  58* 51* >60  GFRAA  --  >60 59* >60    LIVER FUNCTION TESTS: Recent Labs    08/05/17 1547 10/02/17 0942 10/22/17 1045  BILITOT 0.50 0.8 0.6  AST '28 18 18  '$ ALT '27 11 11  '$ ALKPHOS 95 107 91  PROT 8.0 8.1 8.3  ALBUMIN 3.6 4.0 4.1    TUMOR MARKERS: No results for input(s): AFPTM, CEA, CA199, CHROMGRNA in the last 8760 hours.  Assessment and Plan: Esophageal cancer For port placement Labs ok Risks and benefits of image guided port-a-catheter placement was discussed with the patient including, but not limited to bleeding, infection, pneumothorax, or fibrin sheath development and need for additional procedures.  All of the patient's questions were answered, patient is agreeable to proceed. Consent signed and in chart.    Thank you for this interesting consult.  I greatly enjoyed meeting BUNNIE LEDERMAN and look forward to participating in their care.  A copy of this report was sent to the requesting provider on this date.  Electronically Signed: Ascencion Dike, PA-C 01/05/2018, 1:42 PM   I spent a total of 20 minutes in face to face in clinical consultation, greater than 50% of which was counseling/coordinating care for port placement

## 2018-01-07 ENCOUNTER — Other Ambulatory Visit: Payer: Self-pay

## 2018-01-07 ENCOUNTER — Ambulatory Visit: Payer: Self-pay | Admitting: Nurse Practitioner

## 2018-01-08 ENCOUNTER — Ambulatory Visit: Payer: Self-pay | Admitting: Nurse Practitioner

## 2018-01-08 ENCOUNTER — Inpatient Hospital Stay: Payer: Medicare Other

## 2018-01-08 ENCOUNTER — Encounter: Payer: Self-pay | Admitting: Nurse Practitioner

## 2018-01-08 ENCOUNTER — Telehealth: Payer: Self-pay

## 2018-01-08 ENCOUNTER — Other Ambulatory Visit: Payer: Self-pay

## 2018-01-08 ENCOUNTER — Inpatient Hospital Stay (HOSPITAL_BASED_OUTPATIENT_CLINIC_OR_DEPARTMENT_OTHER): Payer: Medicare Other | Admitting: Nurse Practitioner

## 2018-01-08 VITALS — BP 111/59 | HR 91 | Temp 97.7°F | Resp 18 | Ht 76.0 in | Wt 205.5 lb

## 2018-01-08 DIAGNOSIS — C159 Malignant neoplasm of esophagus, unspecified: Secondary | ICD-10-CM

## 2018-01-08 DIAGNOSIS — I10 Essential (primary) hypertension: Secondary | ICD-10-CM | POA: Diagnosis not present

## 2018-01-08 DIAGNOSIS — Z86711 Personal history of pulmonary embolism: Secondary | ICD-10-CM | POA: Diagnosis not present

## 2018-01-08 DIAGNOSIS — Z79899 Other long term (current) drug therapy: Secondary | ICD-10-CM

## 2018-01-08 DIAGNOSIS — E119 Type 2 diabetes mellitus without complications: Secondary | ICD-10-CM | POA: Diagnosis not present

## 2018-01-08 DIAGNOSIS — Z86010 Personal history of colonic polyps: Secondary | ICD-10-CM | POA: Diagnosis not present

## 2018-01-08 DIAGNOSIS — Z9049 Acquired absence of other specified parts of digestive tract: Secondary | ICD-10-CM

## 2018-01-08 DIAGNOSIS — Z95828 Presence of other vascular implants and grafts: Secondary | ICD-10-CM | POA: Insufficient documentation

## 2018-01-08 DIAGNOSIS — I739 Peripheral vascular disease, unspecified: Secondary | ICD-10-CM

## 2018-01-08 DIAGNOSIS — R4702 Dysphasia: Secondary | ICD-10-CM | POA: Diagnosis not present

## 2018-01-08 DIAGNOSIS — C160 Malignant neoplasm of cardia: Secondary | ICD-10-CM | POA: Diagnosis not present

## 2018-01-08 DIAGNOSIS — J449 Chronic obstructive pulmonary disease, unspecified: Secondary | ICD-10-CM

## 2018-01-08 DIAGNOSIS — Z5111 Encounter for antineoplastic chemotherapy: Secondary | ICD-10-CM | POA: Diagnosis not present

## 2018-01-08 DIAGNOSIS — Z452 Encounter for adjustment and management of vascular access device: Secondary | ICD-10-CM | POA: Diagnosis not present

## 2018-01-08 DIAGNOSIS — C155 Malignant neoplasm of lower third of esophagus: Secondary | ICD-10-CM

## 2018-01-08 LAB — CBC WITH DIFFERENTIAL (CANCER CENTER ONLY)
Basophils Absolute: 0 10*3/uL (ref 0.0–0.1)
Basophils Relative: 1 %
EOS ABS: 0.2 10*3/uL (ref 0.0–0.5)
EOS PCT: 4 %
HCT: 34.4 % — ABNORMAL LOW (ref 38.4–49.9)
HEMOGLOBIN: 11.4 g/dL — AB (ref 13.0–17.1)
LYMPHS ABS: 1 10*3/uL (ref 0.9–3.3)
Lymphocytes Relative: 18 %
MCH: 33.5 pg — AB (ref 27.2–33.4)
MCHC: 33.2 g/dL (ref 32.0–36.0)
MCV: 101 fL — ABNORMAL HIGH (ref 79.3–98.0)
Monocytes Absolute: 0.3 10*3/uL (ref 0.1–0.9)
Monocytes Relative: 6 %
NEUTROS PCT: 71 %
Neutro Abs: 3.8 10*3/uL (ref 1.5–6.5)
PLATELETS: 151 10*3/uL (ref 140–400)
RBC: 3.41 MIL/uL — AB (ref 4.20–5.82)
RDW: 13.9 % (ref 11.0–14.6)
WBC: 5.3 10*3/uL (ref 4.0–10.3)

## 2018-01-08 LAB — CMP (CANCER CENTER ONLY)
ALT: 10 U/L (ref 0–55)
ANION GAP: 8 (ref 3–11)
AST: 10 U/L (ref 5–34)
Albumin: 3.4 g/dL — ABNORMAL LOW (ref 3.5–5.0)
Alkaline Phosphatase: 112 U/L (ref 40–150)
BUN: 19 mg/dL (ref 7–26)
CHLORIDE: 105 mmol/L (ref 98–109)
CO2: 25 mmol/L (ref 22–29)
Calcium: 9.2 mg/dL (ref 8.4–10.4)
Creatinine: 1.08 mg/dL (ref 0.70–1.30)
GFR, Estimated: >60 mL/min (ref >60)
Glucose, Bld: 268 mg/dL — ABNORMAL HIGH (ref 70–140)
POTASSIUM: 3.7 mmol/L (ref 3.5–5.1)
SODIUM: 138 mmol/L (ref 136–145)
Total Bilirubin: 0.6 mg/dL (ref 0.2–1.2)
Total Protein: 7 g/dL (ref 6.4–8.3)

## 2018-01-08 LAB — CEA (IN HOUSE-CHCC): CEA (CHCC-IN HOUSE): 29.69 ng/mL — AB (ref 0.00–5.00)

## 2018-01-08 MED ORDER — FLUOROURACIL CHEMO INJECTION 2.5 GM/50ML
400.0000 mg/m2 | Freq: Once | INTRAVENOUS | Status: AC
  Administered 2018-01-08: 900 mg via INTRAVENOUS
  Filled 2018-01-08: qty 18

## 2018-01-08 MED ORDER — SODIUM CHLORIDE 0.9 % IV SOLN
2400.0000 mg/m2 | INTRAVENOUS | Status: DC
  Administered 2018-01-08: 5400 mg via INTRAVENOUS
  Filled 2018-01-08: qty 108

## 2018-01-08 MED ORDER — PROCHLORPERAZINE MALEATE 5 MG PO TABS: 5.0000 mg | ORAL_TABLET | Freq: Four times a day (QID) | ORAL | 2 refills | Status: AC | PRN

## 2018-01-08 MED ORDER — PALONOSETRON HCL INJECTION 0.25 MG/5ML
INTRAVENOUS | Status: AC
  Filled 2018-01-08: qty 5

## 2018-01-08 MED ORDER — DEXAMETHASONE SODIUM PHOSPHATE 10 MG/ML IJ SOLN
10.0000 mg | Freq: Once | INTRAMUSCULAR | Status: AC
  Administered 2018-01-08: 10 mg via INTRAVENOUS

## 2018-01-08 MED ORDER — DEXAMETHASONE SODIUM PHOSPHATE 10 MG/ML IJ SOLN
INTRAMUSCULAR | Status: AC
  Filled 2018-01-08: qty 1

## 2018-01-08 MED ORDER — LIDOCAINE-PRILOCAINE 2.5-2.5 % EX CREA: TOPICAL_CREAM | CUTANEOUS | 2 refills | Status: AC

## 2018-01-08 MED ORDER — OXALIPLATIN CHEMO INJECTION 100 MG/20ML
89.0000 mg/m2 | Freq: Once | INTRAVENOUS | Status: AC
  Administered 2018-01-08: 200 mg via INTRAVENOUS
  Filled 2018-01-08: qty 40

## 2018-01-08 MED ORDER — DEXTROSE 5 % IV SOLN
400.0000 mg/m2 | Freq: Once | INTRAVENOUS | Status: AC
Start: 2018-01-08 — End: 2018-01-08
  Administered 2018-01-08: 896 mg via INTRAVENOUS
  Filled 2018-01-08: qty 44.8

## 2018-01-08 MED ORDER — DEXTROSE 5 % IV SOLN
Freq: Once | INTRAVENOUS | Status: AC
  Administered 2018-01-08: 11:00:00 via INTRAVENOUS

## 2018-01-08 MED ORDER — PALONOSETRON HCL INJECTION 0.25 MG/5ML
0.2500 mg | Freq: Once | INTRAVENOUS | Status: AC
  Administered 2018-01-08: 0.25 mg via INTRAVENOUS

## 2018-01-08 MED ORDER — SODIUM CHLORIDE 0.9% FLUSH
10.0000 mL | INTRAVENOUS | Status: DC | PRN
  Administered 2018-01-08: 10 mL
  Filled 2018-01-08: qty 10

## 2018-01-08 MED ORDER — SODIUM CHLORIDE 0.9 % IV SOLN: 10.0000 mg | Freq: Once | INTRAVENOUS | Status: DC

## 2018-01-08 NOTE — Telephone Encounter (Signed)
Printed avs and calender of upcoming appointment. Per 5/23 los 

## 2018-01-08 NOTE — Patient Instructions (Signed)
[facility] Discharge Instructions for Patients Receiving Chemotherapy  Today you received the following chemotherapy agents: Oxaliplatin, Leucovorin and Adrucil.  To help prevent nausea and vomiting after your treatment, we encourage you to take your nausea medication as prescribed.   If you develop nausea and vomiting that is not controlled by your nausea medication, call the clinic.   BELOW ARE SYMPTOMS THAT SHOULD BE REPORTED IMMEDIATELY:  *FEVER GREATER THAN 100.5 F  *CHILLS WITH OR WITHOUT FEVER  NAUSEA AND VOMITING THAT IS NOT CONTROLLED WITH YOUR NAUSEA MEDICATION  *UNUSUAL SHORTNESS OF BREATH  *UNUSUAL BRUISING OR BLEEDING  TENDERNESS IN MOUTH AND THROAT WITH OR WITHOUT PRESENCE OF ULCERS  *URINARY PROBLEMS  *BOWEL PROBLEMS  UNUSUAL RASH Items with * indicate a potential emergency and should be followed up as soon as possible.  Feel free to call the clinic should you have any questions or concerns. The clinic phone number is [phone].  Please show the [facility] at check-in to the Emergency Department and triage nurse.  Oxaliplatin Injection What is this medicine? OXALIPLATIN (ox AL i PLA tin) is a chemotherapy drug. It targets fast dividing cells, like cancer cells, and causes these cells to die. This medicine is used to treat cancers of the colon and rectum, and many other cancers. This medicine may be used for other purposes; ask your health care provider or pharmacist if you have questions. COMMON BRAND NAME(S): Eloxatin What should I tell my health care provider before I take this medicine? They need to know if you have any of these conditions: -kidney disease -an unusual or allergic reaction to oxaliplatin, other chemotherapy, other medicines, foods, dyes, or preservatives -pregnant or trying to get pregnant -breast-feeding How should I use this medicine? This drug is given as an infusion into a vein. It is administered in a  hospital or clinic by a specially trained health care professional. Talk to your pediatrician regarding the use of this medicine in children. Special care may be needed. Overdosage: If you think you have taken too much of this medicine contact a poison control center or emergency room at once. NOTE: This medicine is only for you. Do not share this medicine with others. What if I miss a dose? It is important not to miss a dose. Call your doctor or health care professional if you are unable to keep an appointment. What may interact with this medicine? -medicines to increase blood counts like filgrastim, pegfilgrastim, sargramostim -probenecid -some antibiotics like amikacin, gentamicin, neomycin, polymyxin B, streptomycin, tobramycin -zalcitabine Talk to your doctor or health care professional before taking any of these medicines: -acetaminophen -aspirin -ibuprofen -ketoprofen -naproxen This list may not describe all possible interactions. Give your health care provider a list of all the medicines, herbs, non-prescription drugs, or dietary supplements you use. Also tell them if you smoke, drink alcohol, or use illegal drugs. Some items may interact with your medicine. What should I watch for while using this medicine? Your condition will be monitored carefully while you are receiving this medicine. You will need important blood work done while you are taking this medicine. This medicine can make you more sensitive to cold. Do not drink cold drinks or use ice. Cover exposed skin before coming in contact with cold temperatures or cold objects. When out in cold weather wear warm clothing and cover your mouth and nose to warm the air that goes into your lungs. Tell your doctor if you get sensitive to the cold.  This drug may make you feel generally unwell. This is not uncommon, as chemotherapy can affect healthy cells as well as cancer cells. Report any side effects. Continue your course of treatment  even though you feel ill unless your doctor tells you to stop. In some cases, you may be given additional medicines to help with side effects. Follow all directions for their use. Call your doctor or health care professional for advice if you get a fever, chills or sore throat, or other symptoms of a cold or flu. Do not treat yourself. This drug decreases your body's ability to fight infections. Try to avoid being around people who are sick. This medicine may increase your risk to bruise or bleed. Call your doctor or health care professional if you notice any unusual bleeding. Be careful brushing and flossing your teeth or using a toothpick because you may get an infection or bleed more easily. If you have any dental work done, tell your dentist you are receiving this medicine. Avoid taking products that contain aspirin, acetaminophen, ibuprofen, naproxen, or ketoprofen unless instructed by your doctor. These medicines may hide a fever. Do not become pregnant while taking this medicine. Women should inform their doctor if they wish to become pregnant or think they might be pregnant. There is a potential for serious side effects to an unborn child. Talk to your health care professional or pharmacist for more information. Do not breast-feed an infant while taking this medicine. Call your doctor or health care professional if you get diarrhea. Do not treat yourself. What side effects may I notice from receiving this medicine? Side effects that you should report to your doctor or health care professional as soon as possible: -allergic reactions like skin rash, itching or hives, swelling of the face, lips, or tongue -low blood counts - This drug may decrease the number of white blood cells, red blood cells and platelets. You may be at increased risk for infections and bleeding. -signs of infection - fever or chills, cough, sore throat, pain or difficulty passing urine -signs of decreased platelets or  bleeding - bruising, pinpoint red spots on the skin, black, tarry stools, nosebleeds -signs of decreased red blood cells - unusually weak or tired, fainting spells, lightheadedness -breathing problems -chest pain, pressure -cough -diarrhea -jaw tightness -mouth sores -nausea and vomiting -pain, swelling, redness or irritation at the injection site -pain, tingling, numbness in the hands or feet -problems with balance, talking, walking -redness, blistering, peeling or loosening of the skin, including inside the mouth -trouble passing urine or change in the amount of urine Side effects that usually do not require medical attention (report to your doctor or health care professional if they continue or are bothersome): -changes in vision -constipation -hair loss -loss of appetite -metallic taste in the mouth or changes in taste -stomach pain This list may not describe all possible side effects. Call your doctor for medical advice about side effects. You may report side effects to FDA at 1-800-FDA-1088. Where should I keep my medicine? This drug is given in a hospital or clinic and will not be stored at home. NOTE: This sheet is a summary. It may not cover all possible information. If you have questions about this medicine, talk to your doctor, pharmacist, or health care provider.  2018 Elsevier/Gold Standard (2008-03-01 17:22:47)  Leucovorin injection What is this medicine? LEUCOVORIN (loo koe VOR in) is used to prevent or treat the harmful effects of some medicines. This medicine is used to treat  anemia caused by a low amount of folic acid in the body. It is also used with 5-fluorouracil (5-FU) to treat colon cancer. This medicine may be used for other purposes; ask your health care provider or pharmacist if you have questions. What should I tell my health care provider before I take this medicine? They need to know if you have any of these conditions: -anemia from low levels of vitamin  B-12 in the blood -an unusual or allergic reaction to leucovorin, folic acid, other medicines, foods, dyes, or preservatives -pregnant or trying to get pregnant -breast-feeding How should I use this medicine? This medicine is for injection into a muscle or into a vein. It is given by a health care professional in a hospital or clinic setting. Talk to your pediatrician regarding the use of this medicine in children. Special care may be needed. Overdosage: If you think you have taken too much of this medicine contact a poison control center or emergency room at once. NOTE: This medicine is only for you. Do not share this medicine with others. What if I miss a dose? This does not apply. What may interact with this medicine? -capecitabine -fluorouracil -phenobarbital -phenytoin -primidone -trimethoprim-sulfamethoxazole This list may not describe all possible interactions. Give your health care provider a list of all the medicines, herbs, non-prescription drugs, or dietary supplements you use. Also tell them if you smoke, drink alcohol, or use illegal drugs. Some items may interact with your medicine. What should I watch for while using this medicine? Your condition will be monitored carefully while you are receiving this medicine. This medicine may increase the side effects of 5-fluorouracil, 5-FU. Tell your doctor or health care professional if you have diarrhea or mouth sores that do not get better or that get worse. What side effects may I notice from receiving this medicine? Side effects that you should report to your doctor or health care professional as soon as possible: -allergic reactions like skin rash, itching or hives, swelling of the face, lips, or tongue -breathing problems -fever, infection -mouth sores -unusual bleeding or bruising -unusually weak or tired Side effects that usually do not require medical attention (report to your doctor or health care professional if they  continue or are bothersome): -constipation or diarrhea -loss of appetite -nausea, vomiting This list may not describe all possible side effects. Call your doctor for medical advice about side effects. You may report side effects to FDA at 1-800-FDA-1088. Where should I keep my medicine? This drug is given in a hospital or clinic and will not be stored at home. NOTE: This sheet is a summary. It may not cover all possible information. If you have questions about this medicine, talk to your doctor, pharmacist, or health care provider.  2018 Elsevier/Gold Standard (2008-02-09 16:50:29)  Fluorouracil, 5-FU injection What is this medicine? FLUOROURACIL, 5-FU (flure oh YOOR a sil) is a chemotherapy drug. It slows the growth of cancer cells. This medicine is used to treat many types of cancer like breast cancer, colon or rectal cancer, pancreatic cancer, and stomach cancer. This medicine may be used for other purposes; ask your health care provider or pharmacist if you have questions. COMMON BRAND NAME(S): Adrucil What should I tell my health care provider before I take this medicine? They need to know if you have any of these conditions: -blood disorders -dihydropyrimidine dehydrogenase (DPD) deficiency -infection (especially a virus infection such as chickenpox, cold sores, or herpes) -kidney disease -liver disease -malnourished, poor nutrition -recent or  ongoing radiation therapy -an unusual or allergic reaction to fluorouracil, other chemotherapy, other medicines, foods, dyes, or preservatives -pregnant or trying to get pregnant -breast-feeding How should I use this medicine? This drug is given as an infusion or injection into a vein. It is administered in a hospital or clinic by a specially trained health care professional. Talk to your pediatrician regarding the use of this medicine in children. Special care may be needed. Overdosage: If you think you have taken too much of this medicine  contact a poison control center or emergency room at once. NOTE: This medicine is only for you. Do not share this medicine with others. What if I miss a dose? It is important not to miss your dose. Call your doctor or health care professional if you are unable to keep an appointment. What may interact with this medicine? -allopurinol -cimetidine -dapsone -digoxin -hydroxyurea -leucovorin -levamisole -medicines for seizures like ethotoin, fosphenytoin, phenytoin -medicines to increase blood counts like filgrastim, pegfilgrastim, sargramostim -medicines that treat or prevent blood clots like warfarin, enoxaparin, and dalteparin -methotrexate -metronidazole -pyrimethamine -some other chemotherapy drugs like busulfan, cisplatin, estramustine, vinblastine -trimethoprim -trimetrexate -vaccines Talk to your doctor or health care professional before taking any of these medicines: -acetaminophen -aspirin -ibuprofen -ketoprofen -naproxen This list may not describe all possible interactions. Give your health care provider a list of all the medicines, herbs, non-prescription drugs, or dietary supplements you use. Also tell them if you smoke, drink alcohol, or use illegal drugs. Some items may interact with your medicine. What should I watch for while using this medicine? Visit your doctor for checks on your progress. This drug may make you feel generally unwell. This is not uncommon, as chemotherapy can affect healthy cells as well as cancer cells. Report any side effects. Continue your course of treatment even though you feel ill unless your doctor tells you to stop. In some cases, you may be given additional medicines to help with side effects. Follow all directions for their use. Call your doctor or health care professional for advice if you get a fever, chills or sore throat, or other symptoms of a cold or flu. Do not treat yourself. This drug decreases your body's ability to fight  infections. Try to avoid being around people who are sick. This medicine may increase your risk to bruise or bleed. Call your doctor or health care professional if you notice any unusual bleeding. Be careful brushing and flossing your teeth or using a toothpick because you may get an infection or bleed more easily. If you have any dental work done, tell your dentist you are receiving this medicine. Avoid taking products that contain aspirin, acetaminophen, ibuprofen, naproxen, or ketoprofen unless instructed by your doctor. These medicines may hide a fever. Do not become pregnant while taking this medicine. Women should inform their doctor if they wish to become pregnant or think they might be pregnant. There is a potential for serious side effects to an unborn child. Talk to your health care professional or pharmacist for more information. Do not breast-feed an infant while taking this medicine. Men should inform their doctor if they wish to father a child. This medicine may lower sperm counts. Do not treat diarrhea with over the counter products. Contact your doctor if you have diarrhea that lasts more than 2 days or if it is severe and watery. This medicine can make you more sensitive to the sun. Keep out of the sun. If you cannot avoid being in the sun,  wear protective clothing and use sunscreen. Do not use sun lamps or tanning beds/booths. What side effects may I notice from receiving this medicine? Side effects that you should report to your doctor or health care professional as soon as possible: -allergic reactions like skin rash, itching or hives, swelling of the face, lips, or tongue -low blood counts - this medicine may decrease the number of white blood cells, red blood cells and platelets. You may be at increased risk for infections and bleeding. -signs of infection - fever or chills, cough, sore throat, pain or difficulty passing urine -signs of decreased platelets or bleeding - bruising,  pinpoint red spots on the skin, black, tarry stools, blood in the urine -signs of decreased red blood cells - unusually weak or tired, fainting spells, lightheadedness -breathing problems -changes in vision -chest pain -mouth sores -nausea and vomiting -pain, swelling, redness at site where injected -pain, tingling, numbness in the hands or feet -redness, swelling, or sores on hands or feet -stomach pain -unusual bleeding Side effects that usually do not require medical attention (report to your doctor or health care professional if they continue or are bothersome): -changes in finger or toe nails -diarrhea -dry or itchy skin -hair loss -headache -loss of appetite -sensitivity of eyes to the light -stomach upset -unusually teary eyes This list may not describe all possible side effects. Call your doctor for medical advice about side effects. You may report side effects to FDA at 1-800-FDA-1088. Where should I keep my medicine? This drug is given in a hospital or clinic and will not be stored at home. NOTE: This sheet is a summary. It may not cover all possible information. If you have questions about this medicine, talk to your doctor, pharmacist, or health care provider.  2018 Elsevier/Gold Standard (2007-12-09 13:53:16)

## 2018-01-08 NOTE — Progress Notes (Addendum)
[facility] OFFICE PROGRESS NOTE   Diagnosis: Esophagus cancer  INTERVAL HISTORY:   Ronald Johnson returns as scheduled.  He has persistent/worsening dysphagia.  No nausea.  No constipation or diarrhea.  No baseline neuropathy symptoms.  He has a good appetite.  Objective:  Vital signs in last 24 hours:  Blood pressure (!) 111/59, pulse 91, temperature 97.7 F (36.5 C), temperature source Oral, resp. rate 18, height 6\' 4"  (1.93 m), weight 205 lb 8 oz (93.2 kg), SpO2 96 %.    HEENT: No thrush or ulcers. Lymphatics: No palpable cervical or supraclavicular lymph nodes. Resp: Lungs clear bilaterally.  Distant breath sounds. Cardio: Regular rate and rhythm. GI: Abdomen soft and nontender.  No hepatomegaly.  No mass. Vascular: No leg edema. Port-A-Cath without erythema  Lab Results:  Lab Results  Component Value Date   WBC 5.3 01/08/2018   HGB 11.4 (L) 01/08/2018   HCT 34.4 (L) 01/08/2018   MCV 101.0 (H) 01/08/2018   PLT 151 01/08/2018   NEUTROABS 3.8 01/08/2018    Imaging:  No results found.  Medications: I have reviewed the patient's current medications.  Assessment/Plan: 1. 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 EUS 03/16/2015 confirmed a clinical stage IIb (uT3,uN1) tumor ? Staging CTs of the chest, abdomen, and pelvis with no evidence of metastatic disease ? Initiation of radiation 04/19/2015, cycle 1 Taxol/carboplatin 04/20/2015: Radiation completed 05/29/2015; the fifth and final week of Taxol/carboplatin 05/18/2015 ? Restaging CTs 07/20/2015 revealed no evidence of metastatic disease, incidental left lower lobe pulmonary embolism ? Transhiatal total esophagectomy 07/25/2015 confirmed a ypT3, ypN2 with 4/5 positive lymph nodes, lymphovascular invasion, perineural invasion, negative resection margins ? PD1 score-0 ? CT neck 07/25/2017-4-5 cm mass in the right lower neck upper mediastinum immediately adjacent to  the esophageal anastomosis. 12 mm mass within the right parotid enlarged since the previous PET study where it was about half that size. Question few small pulmonary nodules. Borderline enlarged mediastinal lymph nodes. ? CTs brain/chest/abdomen/pelvis 08/06/2017-ill-defined mass at the right thoracic inlet extending into the right superior mediastinum; new bilateral but right much greater than left peripheral reticulonodular and groundglass opacities; scattered small paratracheal and anterior mediastinal lymph nodes; small bilateral axillary lymph nodes. Brain CT with no evidence of metastasis. ? 08/26/2017 biopsy right supraclavicular lymph node-very scant fragment of benign lymph node. ? PET scan 09/11/2017-poorly marginated heterogeneously hypermetabolic soft tissue mass at the right thoracic inlet extending laterally from the esophagogastric anastomosis in the neck. No definite hypermetabolic metastatic disease.Patchy tree-in-bud opacities throughout the lungs significantly decreased. Persistent scattered subcentimeter solid pulmonary nodules in both lungs stable to decreased. Stable irregular 1.7 cm nodular bandlike opacity anterior left lower lobe with associated low-level metabolism. Nonspecific mildly hypermetabolic subcarinal and bilateral hilar lymph nodes. Nonspecific mildly hypermetabolic nodular focus of soft tissue in the upper left retroperitoneum anterior to the adrenal gland. ? Upper endoscopy 09/15/2017-at 20 cm from the incisors tight stricture at the anastomosis measuring only a few millimeters in diameter. Surrounding tissues firm, slightly friable, noncompliant. Stricture was dilated with a 15 mm balloon several times. Diameter of stricture improved but still would not permit passage of a standard upper endoscope. Friable areas biopsied-invasive adenocarcinoma. ? Initiation of radiation and concurrent capecitabine 2/4/2019completed 10/30/2017 ? CTs neck and chest 12/23/2017-  stable abnormal soft tissue at the right thoracic inlet with encasement of the proximal right common carotid artery, interval development of sclerotic lesions at C7 and T1, new indeterminate right lung opacities ? Cycle  1 FOLFOX 01/08/2018  2. Postprandial subxiphoid pain secondary to #1  3. Diabetes-elevated blood sugar readings at the [facility] and at home  4. COPD  5. Depression-Improved with Wellbutrin  6. Hypertension  7. History of adenomatous colon polyps  8. History of diabetic related neuropathy, peripheral arterial disease, and nephropathy  9. History of nausea-likely related to radiation esophagitis/gastritis  10. Incidental pulmonary embolism noted on the CT 07/20/2015, treated with Xarelto  11. Admission 08/18/2015 with GI bleeding while on Xarelto, anticoagulation therapy discontinued  12. Dysphagia-improved with esophageal dilatation procedures by Dr. Henrene Pastor, progressive dysphagia secondary to local recurrence of esophagus cancer with obstruction at the esophagus anastomosis  13.History ofmild thrombocytopenia  14. CT scan abdomen/pelvis 10/09/2016 (done to evaluate persistent nausea, epigastric pain)-no acute findings or explanation for the patient's symptoms. Resolving postsurgical changes from presumed previous esophagectomy and gastric pull-through. No evidence of metastatic disease.     Disposition: Ronald Johnson appears unchanged.  Plan to proceed with cycle 1 FOLFOX today as scheduled.  Potential toxicities again reviewed, questions answered.  Prescriptions were sent to his pharmacy for Compazine 10 mg every 6 hours as needed and EMLA cream.  He continues to have dysphagia.  He will focus on a liquid/soft solid diet.  He has a vocal cord procedure scheduled 01/22/2018.  He will return for lab, follow-up and cycle 2 FOLFOX on 01/26/2018.  He will contact the office in the interim with any problems.  Patient seen with Dr.  [name].    Ronald Johnson ANP/GNP-BC   01/08/2018  9:35 AM This was a shared visit with Ronald Johnson.  Ronald Johnson will begin FOLFOX chemotherapy today.  Ronald Manson, MD

## 2018-01-10 ENCOUNTER — Inpatient Hospital Stay: Payer: Medicare Other

## 2018-01-10 VITALS — BP 124/80 | HR 94 | Temp 97.7°F

## 2018-01-10 DIAGNOSIS — J449 Chronic obstructive pulmonary disease, unspecified: Secondary | ICD-10-CM | POA: Diagnosis not present

## 2018-01-10 DIAGNOSIS — Z9049 Acquired absence of other specified parts of digestive tract: Secondary | ICD-10-CM | POA: Diagnosis not present

## 2018-01-10 DIAGNOSIS — Z5111 Encounter for antineoplastic chemotherapy: Secondary | ICD-10-CM | POA: Diagnosis not present

## 2018-01-10 DIAGNOSIS — Z452 Encounter for adjustment and management of vascular access device: Secondary | ICD-10-CM | POA: Diagnosis not present

## 2018-01-10 DIAGNOSIS — C159 Malignant neoplasm of esophagus, unspecified: Secondary | ICD-10-CM

## 2018-01-10 DIAGNOSIS — R4702 Dysphasia: Secondary | ICD-10-CM | POA: Diagnosis not present

## 2018-01-10 DIAGNOSIS — C160 Malignant neoplasm of cardia: Secondary | ICD-10-CM | POA: Diagnosis not present

## 2018-01-10 MED ORDER — SODIUM CHLORIDE 0.9% FLUSH
10.0000 mL | INTRAVENOUS | Status: DC | PRN
  Administered 2018-01-10: 10 mL
  Filled 2018-01-10: qty 10

## 2018-01-10 MED ORDER — HEPARIN SOD (PORK) LOCK FLUSH 100 UNIT/ML IV SOLN
500.0000 [IU] | Freq: Once | INTRAVENOUS | Status: AC | PRN
  Administered 2018-01-10: 500 [IU]
  Filled 2018-01-10: qty 5

## 2018-01-13 ENCOUNTER — Other Ambulatory Visit: Payer: Self-pay | Admitting: Otolaryngology

## 2018-01-14 ENCOUNTER — Encounter: Payer: Self-pay | Admitting: Endocrinology

## 2018-01-14 ENCOUNTER — Ambulatory Visit (INDEPENDENT_AMBULATORY_CARE_PROVIDER_SITE_OTHER): Payer: Medicare Other | Admitting: Endocrinology

## 2018-01-14 VITALS — BP 110/64 | HR 101 | Wt 205.2 lb

## 2018-01-14 DIAGNOSIS — I10 Essential (primary) hypertension: Secondary | ICD-10-CM | POA: Diagnosis not present

## 2018-01-14 DIAGNOSIS — Z794 Long term (current) use of insulin: Secondary | ICD-10-CM

## 2018-01-14 DIAGNOSIS — C159 Malignant neoplasm of esophagus, unspecified: Secondary | ICD-10-CM | POA: Diagnosis not present

## 2018-01-14 DIAGNOSIS — E1151 Type 2 diabetes mellitus with diabetic peripheral angiopathy without gangrene: Secondary | ICD-10-CM

## 2018-01-14 LAB — POCT GLYCOSYLATED HEMOGLOBIN (HGB A1C): HEMOGLOBIN A1C: 7.8 % — AB (ref 4.0–5.6)

## 2018-01-14 MED ORDER — INSULIN NPH ISOPHANE & REGULAR (70-30) 100 UNIT/ML ~~LOC~~ SUSP: 70.0000 [IU] | Freq: Every day | SUBCUTANEOUS | 11 refills | Status: DC

## 2018-01-14 NOTE — Patient Instructions (Addendum)
Please increase the insulin to 70 units with breakfast, no matter what your blood sugar is. However, take 120 units on chemo days.  On this type of insulin schedule, you should eat meals on a regular schedule.  If a meal is missed or significantly delayed, your blood sugar could go low. check your blood sugar twice a day.  vary the time of day when you check, between before the 3 meals, and at bedtime.  also check if you have symptoms of your blood sugar being too high or too low.  please keep a record of the readings and bring it to your next appointment here (or you can bring the meter itself).  You can write it on any piece of paper.  please call us sooner if your blood sugar goes below 70, or if you have a lot of readings over 200.  Please continue to carefully watch the blood sugar, because your cancer can cause variation in your insulin need.  Please continue the same medication for blood pressure.   You can stop taking the lipitor. Please come back for a follow-up appointment in 2 months.

## 2018-01-14 NOTE — Pre-Procedure Instructions (Signed)
ANDREZ LIEURANCE  01/14/2018      [facility] - [address], [place] [address] Phone: [phone] Fax: [phone]  [place], [place] [address] [place] [address] Phone: [phone] Fax: [place], [place] [address] Phone: [phone] Fax: [place] [address] [place] - [address] [place] [place] [address] Phone: [phone] Fax: [place], [place] [address] Phone: [phone] Fax: [phone]    Your procedure is scheduled on Thurs., January 22, 2018 from 1:00PM-2:00PM  Report to [facility] Admitting Entrance "A" at 11:00AM  Call this number if you have problems the morning of surgery:  [phone]   Remember:  No food or drinks after midnight on June 5th  Take these medicines the morning of surgery with A SIP OF WATER: Escitalopram (LEXAPRO), RABEprazole (ACIPHEX), Topiramate (TOPAMAX), and Ketotifen (ZADITOR). If needed Diazepam (VALIUM) for anxiety  As of today, stop taking all Other Aspirin Products, Vitamins, Fish oils, and Herbal medications. Also stop all NSAIDS i.e. Advil, Ibuprofen, Motrin, Aleve, Anaprox, Naproxen, BC and Goody Powders.  How to Manage Your Diabetes Before and After Surgery  Why is it important to control my blood sugar before and after surgery? . Improving blood sugar levels before and after surgery helps healing and can limit problems. . A way of improving blood sugar control is eating a healthy diet by: o  Eating less sugar and carbohydrates o  Increasing activity/exercise o  Talking with your doctor about  reaching your blood sugar goals . High blood sugars (greater than 180 mg/dL) can raise your risk of infections and slow your recovery, so you will need to focus on controlling your diabetes during the weeks before surgery. . Make sure that the doctor who takes care of your diabetes knows about your planned surgery including the date and location.  How do I manage my blood sugar before surgery? . Check your blood sugar at least 4 times a day, starting 2 days before surgery, to make sure that the level is not too high or low. o Check your blood sugar the morning of your surgery when you wake up and every 2 hours until you get to the Short Stay unit. . If your blood sugar is less than 70 mg/dL, you will need to treat for low blood sugar: o Do not take insulin. o Treat a low blood sugar (less than 70 mg/dL) with  cup of clear juice (cranberry or apple), 4 glucose tablets, OR glucose gel. Recheck blood sugar in 15 minutes after treatment (to make sure it is greater than 70 mg/dL). If your blood sugar is not greater than 70 mg/dL on recheck, call [phone] o  for further instructions. . Report your blood sugar to the short stay nurse when you get to Short Stay.  . If you are admitted to the hospital after surgery: o Your blood sugar will be checked by the staff and you will probably be given insulin after surgery (instead of oral diabetes medicines) to make sure you have good blood sugar levels. o The goal for blood sugar control after surgery is  80-180 mg/dL.  WHAT DO I DO ABOUT MY DIABETES MEDICATION?  Marland Kitchen Do not take oral diabetes medicines (pills) the morning of surgery.  . THE MORNING OF SURGERY, take ________0_____ units of ___Humulin 70/30_______insulin.  . If your CBG is greater than 220 mg/dL, you make take 1/2 of your correction dose.  Reviewed and Endorsed by [facility] Patient Education Committee, August 2015    Do not wear jewelry.  Do not wear lotions, powders, colognes, or  deodorant.  Do not shave 48 hours prior to surgery.  Men may shave face.  Do not bring valuables to the hospital.  [facility] is not responsible for any belongings or valuables.  Contacts, dentures or bridgework may not be worn into surgery.  Leave your suitcase in the car.  After surgery it may be brought to your room.  For patients admitted to the hospital, discharge time will be determined by your treatment team.  Patients discharged the day of surgery will not be allowed to drive home.   Special instructions:   [place]- Preparing For Surgery  Before surgery, you can play an important role. Because skin is not sterile, your skin needs to be as free of germs as possible. You can reduce the number of germs on your skin by washing with CHG (chlorahexidine gluconate) Soap before surgery.  CHG is an antiseptic cleaner which kills germs and bonds with the skin to continue killing germs even after washing.    Oral Hygiene is also important to reduce your risk of infection.  Remember - BRUSH YOUR TEETH THE MORNING OF SURGERY WITH YOUR REGULAR TOOTHPASTE  Please do not use if you have an allergy to CHG or antibacterial soaps. If your skin becomes reddened/irritated stop using the CHG.  Do not shave (including legs and underarms) for at least 48 hours prior to first CHG shower. It is OK to shave your face.  Please follow these instructions carefully.   1. Shower the NIGHT BEFORE SURGERY and the MORNING OF SURGERY with CHG.   2. If you chose to wash your hair, wash your hair first as usual with your normal shampoo.  3. After you shampoo, rinse your hair and body thoroughly to remove the shampoo.  4. Use CHG as you would any other liquid soap. You can apply CHG directly to the skin and wash gently with a scrungie or a clean washcloth.   5. Apply the CHG Soap to your body ONLY FROM THE NECK DOWN.  Do not use on open wounds or open sores. Avoid contact with your eyes, ears, mouth and  genitals (private parts). Wash Face and genitals (private parts)  with your normal soap.  6. Wash thoroughly, paying special attention to the area where your surgery will be performed.  7. Thoroughly rinse your body with warm water from the neck down.  8. DO NOT shower/wash with your normal soap after using and rinsing off the CHG Soap.  9. Pat yourself dry with a CLEAN TOWEL.  10. Wear CLEAN PAJAMAS to bed the night before surgery, wear comfortable clothes the morning of surgery  11. Place CLEAN SHEETS on your bed the night of your first shower and DO NOT SLEEP WITH PETS.  Day of Surgery:  Do not apply any deodorants/lotions.  Please wear clean clothes to the hospital/surgery center.   Remember to brush your teeth WITH YOUR REGULAR TOOTHPASTE.  Please read over the following fact sheets that you were given. Pain Booklet, Coughing and Deep  Breathing and Surgical Site Infection Prevention

## 2018-01-14 NOTE — Progress Notes (Signed)
Subjective:    Patient ID: Ronald Johnson, male    DOB: 10-Jan-1946, 72 y.o.   MRN: [id number]  HPI Pt returns for f/u of diabetes mellitus: DM type: Insulin-requiring type 2.  Dx'ed: 8889 Complications: polyneuropathy, CAD, PAD, and renal insuff.  Therapy: insulin since 2004.  DKA: never.  Severe hypoglycemia: never.   Pancreatitis: never.   Other: he changed to qd insulin, after he did not achieve good control on multiple daily injections; he cannot afford insulin analogs; insulin requirement has varied since cancer dx; he changed to QAM 70/30, due to pattern of cbg's.  Interval history: he has resumed chemotx (every other Monday, with steroids).  He takes 50-100 units qam (mostly 100).  He brings a record of his cbg's which I have reviewed today.  It varies from 82-200's.  It is in general higher as the day goes on.   Past Medical History:  Diagnosis Date  . Anemia 08/15/2009  . Anxiety   . Arthritis   . [name]'s esophagus 2007  . COLONIC POLYPS, ADENOMATOUS 08/01/2008, 2013, 2014  . COPD (chronic obstructive pulmonary disease) (HCC)    no per pt  . Depression   . Diabetes type 2, controlled ([place]) 03/13/2007  . Diverticulitis   . ED (erectile dysfunction)   . EMPHYSEMA 11/08/2008   no per pt  . Esophageal cancer ([place]) "dx'd ~ 01/2015"  . GERD 03/13/2007  . GIB (gastrointestinal bleeding) 08/18/2015  . Hiatal hernia   . History of blood transfusion 08/18/2015   due to GIB  . HYPERCHOLESTEROLEMIA 02/03/2008  . HYPERTENSION 03/13/2007   pt denies, claims white coat syndrome  . Pneumonia   . Prostatism   . PULMONARY NODULE 11/24/2008  . Stomach cancer ([place]) "dx'd ~ 01/2015"  . TOBACCO ABUSE 11/24/2008    Past Surgical History:  Procedure Laterality Date  . BALLOON DILATION N/A 03/19/2016   Procedure: BALLOON DILATION;  Surgeon: Irene Shipper, MD;  Location: [facility];  Service: Endoscopy;  Laterality: N/A;  . CIRCUMCISION    . COLONOSCOPY    . COMPLETE ESOPHAGECTOMY N/A  07/25/2015   Procedure: TRANSHIATAL TOTAL ESOPHAGECTOMY COMPLETE PYLOROMYOTOMY;  Surgeon: [name], MD;  Location: [place];  Service: Thoracic;  Laterality: N/A;  . EGD  11/19/2005  . ESOPHAGOGASTRODUODENOSCOPY N/A 03/19/2016   Procedure: ESOPHAGOGASTRODUODENOSCOPY (EGD);  Surgeon: Irene Shipper, MD;  Location: [facility];  Service: Endoscopy;  Laterality: N/A;  . ESOPHAGOGASTRODUODENOSCOPY (EGD) WITH PROPOFOL N/A 09/15/2017   Procedure: ESOPHAGOGASTRODUODENOSCOPY (EGD) WITH PROPOFOL;  Surgeon: Irene Shipper, MD;  Location: [facility];  Service: Endoscopy;  Laterality: N/A;  . EUS N/A 03/16/2015   Procedure: UPPER ENDOSCOPIC ULTRASOUND (EUS) RADIAL;  Surgeon: Milus Banister, MD;  Location: [facility];  Service: Endoscopy;  Laterality: N/A;  . FOOT FRACTURE SURGERY Right 1980   right foot w/ pins and screws  . IR FLUORO GUIDE PORT INSERTION LEFT  01/05/2018  . IR US GUIDE VASC ACCESS LEFT  01/05/2018  . JEJUNOSTOMY N/A 07/25/2015   Procedure: FEEDING JEJUNOSTOMY;  Surgeon: [name], MD;  Location: [facility];  Service: Thoracic;  Laterality: N/A;  . POLYPECTOMY    . removal pins and screws foot  1980   right foot  . UPPER GASTROINTESTINAL ENDOSCOPY    . VASECTOMY    . VIDEO BRONCHOSCOPY N/A 07/25/2015   Procedure: VIDEO BRONCHOSCOPY;  Surgeon: [name], MD;  Location: [facility];  Service: Thoracic;  Laterality: N/A;    Social History  Socioeconomic History  . Marital status: Married    Spouse name: Not on file  . Number of children: 2  . Years of education: Not on file  . Highest education level: Not on file  Occupational History  . Occupation: retired    Fish farm manager: [employer]    Comment: works Education administrator  . Financial resource strain: Not on file  . Food insecurity:    Worry: Not on file    Inability: Not on file  . Transportation needs:    Medical: Not on file    Non-medical: Not on file  Tobacco Use  . Smoking status: Former  Smoker    Packs/day: 1.00    Years: 35.00    Pack years: 35.00    Types: Cigarettes    Last attempt to quit: 10/31/2008    Years since quitting: 9.2  . Smokeless tobacco: Never Used  Substance and Sexual Activity  . Alcohol use: No    Alcohol/week: 0.0 oz  . Drug use: No  . Sexual activity: Not Currently    Partners: Female  Lifestyle  . Physical activity:    Days per week: Not on file    Minutes per session: Not on file  . Stress: Not on file  Relationships  . Social connections:    Talks on phone: Not on file    Gets together: Not on file    Attends religious service: Not on file    Active member of club or organization: Not on file    Attends meetings of clubs or organizations: Not on file    Relationship status: Not on file  . Intimate partner violence:    Fear of current or ex partner: Not on file    Emotionally abused: Not on file    Physically abused: Not on file    Forced sexual activity: Not on file  Other Topics Concern  . Not on file  Social History Narrative   Married, wife [place] with #2 grown children   Prior [employer], where he met his wife in [place]   Retired-prior work Personnel officer ADL's    Current Outpatient Medications on File Prior to Visit  Medication Sig Dispense Refill  . diazepam (VALIUM) 5 MG tablet Take 5 mg by mouth every 6 (six) hours as needed for anxiety.    Marland Kitchen escitalopram (LEXAPRO) 20 MG tablet Take 1 tablet (20 mg total) by mouth daily. 90 tablet 3  . glucagon (GLUCAGON EMERGENCY) 1 MG injection Inject 1 mg into the vein once as needed for up to 1 dose. (Patient taking differently: Inject 1 mg into the vein once as needed (for low blood sugars). ) 1 each 12  . glucose blood (PRECISION XTRA TEST STRIPS) test strip Check blood sugar three times a day dx 250.01 270 each 3  . Insulin Syringe-Needle U-100 30G X 3/8" 1 ML MISC Use to inject insulin 3 times daily. 300 each 1  . ketotifen (ZADITOR) 0.025 % ophthalmic solution  Place 1 drop into both eyes 2 (two) times daily.    Marland Kitchen lidocaine-prilocaine (EMLA) cream Apply to Port-A-Cath site 1 hour prior to use 30 g 2  . oxyCODONE (OXY IR/ROXICODONE) 5 MG immediate release tablet Take 1 tablet (5 mg total) by mouth daily as needed for severe pain. 30 tablet 0  . prochlorperazine (COMPAZINE) 5 MG tablet Take 1 tablet (5 mg total) by mouth every 6 (six) hours as needed for nausea or  vomiting. 30 tablet 2  . RABEprazole (ACIPHEX) 20 MG tablet TAKE 1 TABLET(20 MG) BY MOUTH TWICE DAILY 180 tablet 1  . sildenafil (REVATIO) 20 MG tablet 1-5 tabs as needed for ED symptoms (Patient taking differently: Take 20-100 mg by mouth daily as needed (for ED). ) 100 tablet 11  . sucralfate (CARAFATE) 1 g tablet Take 1 tablet (1 g total) by mouth 4 (four) times daily -  with meals and at bedtime. Crush and mix in 1 oz of water to take 120 tablet 1  . topiramate (TOPAMAX) 50 MG tablet 2 in the AM, 1 at night (Patient taking differently: Take 50-100 mg by mouth See admin instructions. 2 in the AM, 1 at night) 270 tablet 1  . [DISCONTINUED] metoprolol succinate (TOPROL-XL) 25 MG 24 hr tablet Take 25 mg by mouth daily.      Current Facility-Administered Medications on File Prior to Visit  Medication Dose Route Frequency Provider Last Rate Last Dose  . 0.9 %  sodium chloride infusion   Intravenous Once Owens Shark, NP        No Known Allergies  Family History  Problem Relation Age of Onset  . Stroke Mother   . Diabetes Mother   . Diabetes Maternal Grandmother        mother side of the family aunts, MGF  . Breast cancer Paternal Grandmother   . AAA (abdominal aortic aneurysm) Father   . Colon cancer Neg Hx   . Esophageal cancer Neg Hx   . Rectal cancer Neg Hx   . Stomach cancer Neg Hx     BP 110/64   Pulse (!) 101   Wt 205 lb 3.2 oz (93.1 kg)   SpO2 95%   BMI 24.98 kg/m    Review of Systems He has lost a few lbs.  He denies hypoglycemia.      Objective:   Physical  Exam VITAL SIGNS:  See vs page GENERAL: no distress Pulses: foot pulses are intact bilaterally.   MSK: no deformity of the feet or ankles.  CV: no edema of the legs or ankles Skin:  no ulcer on the feet or ankles.  normal color and temp on the feet and ankles Neuro: sensation is intact to touch on the feet and ankles.   Ext: There is bilateral onychomycosis of the toenails.     Lab Results  Component Value Date   HGBA1C 7.8 (A) 01/14/2018       Assessment & Plan:  Insulin-requiring type 2 DM, with PAD. this is the best control this pt should aim for, given this regimen, which does match insulin to her changing needs throughout the day Esophageal cancer: worse.  He needs to adjust insulin for steroids he receives with chemotx.  HTN: well-controlled   Patient Instructions  Please increase the insulin to 70 units with breakfast, no matter what your blood sugar is. However, take 120 units on chemo days.  On this type of insulin schedule, you should eat meals on a regular schedule.  If a meal is missed or significantly delayed, your blood sugar could go low. check your blood sugar twice a day.  vary the time of day when you check, between before the 3 meals, and at bedtime.  also check if you have symptoms of your blood sugar being too high or too low.  please keep a record of the readings and bring it to your next appointment here (or you can bring the meter itself).  You can write it on any piece of paper.  please call us sooner if your blood sugar goes below 70, or if you have a lot of readings over 200.  Please continue to carefully watch the blood sugar, because your cancer can cause variation in your insulin need.  Please continue the same medication for blood pressure.   You can stop taking the lipitor. Please come back for a follow-up appointment in 2 months.

## 2018-01-15 ENCOUNTER — Other Ambulatory Visit: Payer: Self-pay

## 2018-01-15 ENCOUNTER — Encounter (HOSPITAL_COMMUNITY): Payer: Self-pay

## 2018-01-15 ENCOUNTER — Encounter (HOSPITAL_COMMUNITY)
Admission: RE | Admit: 2018-01-15 | Discharge: 2018-01-15 | Disposition: A | Discharge status: Home / Self Care | Payer: Medicare Other | Source: Ambulatory Visit | Attending: Otolaryngology | Admitting: Otolaryngology

## 2018-01-15 DIAGNOSIS — I10 Essential (primary) hypertension: Secondary | ICD-10-CM | POA: Insufficient documentation

## 2018-01-15 DIAGNOSIS — Z01812 Encounter for preprocedural laboratory examination: Secondary | ICD-10-CM | POA: Insufficient documentation

## 2018-01-15 DIAGNOSIS — Z794 Long term (current) use of insulin: Secondary | ICD-10-CM | POA: Insufficient documentation

## 2018-01-15 DIAGNOSIS — E119 Type 2 diabetes mellitus without complications: Secondary | ICD-10-CM | POA: Insufficient documentation

## 2018-01-15 DIAGNOSIS — J3800 Paralysis of vocal cords and larynx, unspecified: Secondary | ICD-10-CM | POA: Insufficient documentation

## 2018-01-15 DIAGNOSIS — Z79899 Other long term (current) drug therapy: Secondary | ICD-10-CM | POA: Diagnosis not present

## 2018-01-15 DIAGNOSIS — Z85028 Personal history of other malignant neoplasm of stomach: Secondary | ICD-10-CM | POA: Diagnosis not present

## 2018-01-15 DIAGNOSIS — Z8501 Personal history of malignant neoplasm of esophagus: Secondary | ICD-10-CM | POA: Insufficient documentation

## 2018-01-15 HISTORY — DX: Pneumonia, unspecified organism: J18.9

## 2018-01-15 LAB — CBC
HCT: 36.1 % — ABNORMAL LOW (ref 39.0–52.0)
HEMOGLOBIN: 12 g/dL — AB (ref 13.0–17.0)
MCH: 33.3 pg (ref 26.0–34.0)
MCHC: 33.2 g/dL (ref 30.0–36.0)
MCV: 100.3 fL — ABNORMAL HIGH (ref 78.0–100.0)
PLATELETS: 111 10*3/uL — AB (ref 150–400)
RBC: 3.6 MIL/uL — AB (ref 4.22–5.81)
RDW: 12.6 % (ref 11.5–15.5)
WBC: 4.6 10*3/uL (ref 4.0–10.5)

## 2018-01-15 LAB — BASIC METABOLIC PANEL
ANION GAP: 9 (ref 5–15)
BUN: 15 mg/dL (ref 6–20)
CALCIUM: 9.1 mg/dL (ref 8.9–10.3)
CHLORIDE: 107 mmol/L (ref 101–111)
CO2: 24 mmol/L (ref 22–32)
Creatinine, Ser: 1.04 mg/dL (ref 0.61–1.24)
GFR calc Af Amer: >60 mL/min (ref >60)
GFR calc non Af Amer: >60 mL/min (ref >60)
GLUCOSE: 159 mg/dL — AB (ref 65–99)
Potassium: 3.8 mmol/L (ref 3.5–5.1)
Sodium: 140 mmol/L (ref 135–145)

## 2018-01-15 LAB — GLUCOSE, CAPILLARY: Glucose-Capillary: 141 mg/dL — ABNORMAL HIGH (ref 65–99)

## 2018-01-15 NOTE — Pre-Procedure Instructions (Signed)
Ronald Johnson  01/15/2018      [facility] - [address], [place] [address] Phone: [phone] Fax: [phone]  [place], [place] [address] [place] [address] Phone: [phone] Fax: [place] [address], [place] - [place] [address] [place] [place] [address] Phone: [phone] Fax: [phone]    Your procedure is scheduled on Thurs., January 22, 2018              (posted surgery time 1pm - 2pm)   Report to [facility] Admitting Entrance "A" at 11:00AM   Call this number if you have problems the morning of surgery:  [phone]   Remember:   No food or drinks after midnight on Wednesday.   Take these medicines the morning of surgery with A SIP OF WATER: Escitalopram (LEXAPRO), RABEprazole (ACIPHEX), Topiramate (TOPAMAX), and Ketotifen (ZADITOR). If needed Diazepam (VALIUM) for anxiety  As of today, stop taking all Other Aspirin Products, Vitamins, Fish oils, and Herbal medications. Also stop all NSAIDS i.e. Advil, Ibuprofen, Motrin, Aleve, Anaprox, Naproxen, BC and Goody Powders.  How to Manage Your Diabetes Before and After Surgery  Why is it important to control my blood sugar before and after surgery? . Improving blood sugar levels before and after surgery helps healing and can limit problems. . A way of improving blood sugar control is eating a healthy diet by: o  Eating less sugar and carbohydrates o  Increasing activity/exercise o  Talking with your doctor about reaching your blood sugar goals . High blood sugars (greater than 180 mg/dL) can raise your risk of infections and slow your recovery, so you will need to focus on controlling your diabetes during the weeks before surgery. . Make sure that the doctor who takes care of  your diabetes knows about your planned surgery including the date and location.  How do I manage my blood sugar before surgery? . Check your blood sugar at least 4 times a day, starting 2 days before surgery, to make sure that the level is not too high or low. Marland Kitchen  o Check your blood sugar the morning of your surgery when you wake up and every 2 hours until you get to the Short Stay unit. o  . If your blood sugar is less than 70 mg/dL, you will need to treat for low blood sugar: o Do not take insulin. o Treat a low blood sugar (less than 70 mg/dL) with  cup of clear juice (cranberry or apple), 4 glucose tablets, OR glucose gel. Recheck blood sugar in 15 minutes after treatment (to make sure it is greater than 70 mg/dL). If your blood sugar is not greater than 70 mg/dL on recheck, call [phone] o  for further instructions. o  . Report your blood sugar to the short stay nurse when you get to Short Stay.  . If you are admitted to the hospital after surgery: o Your blood sugar will be checked by the staff and you will probably be given insulin after surgery (instead of oral diabetes medicines) to make sure you have good blood sugar levels. o The goal for blood sugar control after surgery is 80-180 mg/dL.  WHAT DO I DO ABOUT MY DIABETES MEDICATION?  Marland Kitchen Do not take oral diabetes medicines (pills) the morning  of surgery. . The Night before surgery, take 49 units of Humulin 70/30.  Marland Kitchen THE MORNING OF SURGERY, take ___0__ units of ___Humulin 70/30___insulin.  . If your CBG is greater than 220 mg/dL, you make take 1/2 of your correction dose.     Do not wear jewelry.  Do not wear lotions, powders, colognes, or deodorant.  Do not shave 48 hours prior to surgery.  Men may shave face.  Do not bring valuables to the hospital.  [facility] is not responsible for any belongings or valuables.  Contacts, dentures or bridgework may not be worn into surgery.  Leave your suitcase in the car.  After  surgery it may be brought to your room.  For patients admitted to the hospital, discharge time will be determined by your treatment team.  Patients discharged the day of surgery will not be allowed to drive home.   Special instructions:   [place]- Preparing For Surgery  Before surgery, you can play an important role. Because skin is not sterile, your skin needs to be as free of germs as possible. You can reduce the number of germs on your skin by washing with CHG (chlorahexidine gluconate) Soap before surgery.  CHG is an antiseptic cleaner which kills germs and bonds with the skin to continue killing germs even after washing.    Oral Hygiene is also important to reduce your risk of infection.  Remember - BRUSH YOUR TEETH THE MORNING OF SURGERY WITH YOUR REGULAR TOOTHPASTE  Please do not use if you have an allergy to CHG or antibacterial soaps. If your skin becomes reddened/irritated stop using the CHG.  Do not shave (including legs and underarms) for at least 48 hours prior to first CHG shower. It is OK to shave your face.  Please follow these instructions carefully.   1. Shower the NIGHT BEFORE SURGERY and the MORNING OF SURGERY with CHG.   2. If you chose to wash your hair, wash your hair first as usual with your normal shampoo.  3. After you shampoo, rinse your hair and body thoroughly to remove the shampoo.  4. Use CHG as you would any other liquid soap. You can apply CHG directly to the skin and wash gently with a scrungie or a clean washcloth.   5. Apply the CHG Soap to your body ONLY FROM THE NECK DOWN.  Do not use on open wounds or open sores. Avoid contact with your eyes, ears, mouth and genitals (private parts). Wash Face and genitals (private parts)  with your normal soap.  6. Wash thoroughly, paying special attention to the area where your surgery will be performed.  7. Thoroughly rinse your body with warm water from the neck down.  8. DO NOT shower/wash with your  normal soap after using and rinsing off the CHG Soap.  9. Pat yourself dry with a CLEAN TOWEL.  10. Wear CLEAN PAJAMAS to bed the night before surgery, wear comfortable clothes the morning of surgery  11. Place CLEAN SHEETS on your bed the night of your first shower and DO NOT SLEEP WITH PETS.  Day of Surgery:  Do not apply any deodorants/lotions.  Please wear clean clothes to the hospital/surgery center.   Remember to brush your teeth WITH YOUR REGULAR TOOTHPASTE.  Please read over the following fact sheets that you were given. Pain Booklet, Coughing and Deep Breathing and Surgical Site Infection Prevention

## 2018-01-15 NOTE — Progress Notes (Signed)
PCP is Dr. Cordelia Pen  LOV 01/14/2018 He also manages his diabetes medication.  Last A1C was 7.8 01/14/18 Patient checks his blood sugar once a day and ranges from 100 - 300.  Type 2 Oncologist is Dr. Ammie Dalton LOV 12/2017 Dx with cancer back alittle longer than 2016.  Had a battery of tests at that time (he had c/o cp, stomach pain initially) cardiac w/u -- did see Dr. Thresa Ross. LOV was back in 2018 Echo 08/2015 Stress 6.16 Per patients wife, the heart was ruled out as a problem.

## 2018-01-16 NOTE — Progress Notes (Signed)
Anesthesia Chart Review:   Case:  [id number] Date/Time:  01/22/18 1245   Procedure:  MICROLARYNGOSCOPY WITH VOCAL CORD INJECTION (N/A )   Anesthesia type:  General   Pre-op diagnosis:  VOCAL CORD PARALYSIS   Location:  [facility] OR ROOM 09 / [facility]   Surgeon:  Melissa Montane, MD      DISCUSSION: - Pt is a 72 year old male with hx HTN, DM, esophageal cancer, PE, COPD  VS: BP 93/66   Pulse (!) 102   Temp 36.6 C   Resp 18   Ht 6\' 4"  (1.93 m)   Wt 205 lb 8 oz (93.2 kg)   BMI 25.01 kg/m    PROVIDERS: PCP is [name], MD   Patient Care Team: Rigoberto Noel, MD as Consulting Physician (Pulmonary Disease) Ladell Pier, MD as Consulting Physician (Oncology) Kyung Rudd, MD as Consulting Physician (Radiation Oncology) [name], MD as Consulting Physician (Cardiothoracic Surgery)  Cardiologist is Kirk Ruths, MD.  Pt was referred by PCP for abnormal EKG; office visit 07/16/17 with Bernerd Pho, PA. EKG documented as stable compared to prior and abnormal findings likely due to LVH. Coronary CTA recommended but has not happened yet due to burden of cancer care.  Dr. Stanford Breed is aware; he documents "would like cardiac cta when able" on 09/05/17   LABS: Labs reviewed: Acceptable for surgery. (all labs ordered are listed, but only abnormal results are displayed)  Labs Reviewed  GLUCOSE, CAPILLARY - Abnormal; Notable for the following components:      Result Value   Glucose-Capillary 141 (*)    All other components within normal limits  CBC - Abnormal; Notable for the following components:   RBC 3.60 (*)    Hemoglobin 12.0 (*)    HCT 36.1 (*)    MCV 100.3 (*)    Platelets 111 (*)    All other components within normal limits  BASIC METABOLIC PANEL - Abnormal; Notable for the following components:   Glucose, Bld 159 (*)    All other components within normal limits     IMAGES:  CT chest 12/23/17:  1. Abnormal soft tissue in the right thoracic inlet appears more  confluent than previous diagnostic CT chest of 08/06/2017. As before, this circumferentially encases and narrows the proximal right common carotid artery. 2. Interval development of sclerotic lesions in the C7 and T1 vertebral bodies since PET-CT of 09/11/2017, highly suspicious for metastatic disease. 3. Bilateral pulmonary nodules as before. Several new ill-defined nodular opacities are seen in the right lung. These may reflect areas of infectious/inflammatory disease although new lung metastases cannot be excluded. Close attention on follow-up recommended. 4. New tiny right pleural effusion.  EKG 07/16/17: Sinus tachycardia (102 bpm) with PACs.  LVH with repolarization abnormality.  Cannot rule out inferior infarct, age undetermined.   CV:  Echo 08/20/15:  - Left ventricle: The cavity size was normal. Wall thickness was increased in a pattern of mild LVH. Systolic function was vigorous. The estimated ejection fraction was in the range of 65% to 70%. Wall motion was normal; there were no regional wall motion abnormalities. Features are consistent with a pseudonormal left ventricular filling pattern, with concomitant abnormal relaxation and increased filling pressure (grade 2 diastolic dysfunction). - Aortic valve: Peak velocity ratio of LVOT to aortic valve: 0.68. Valve area (Vmax): 2.84 cm^2. - Left atrium: The atrium was mildly dilated. - Right atrium: Central venous pressure (est): 3 mm Hg. - Tricuspid valve: There was trivial regurgitation. -  Pulmonary arteries: Systolic pressure could not be accurately estimated. - Pericardium, extracardiac: There was no pericardial effusion. - Impressions:  Mild LVH with LVEF 65-70% and grade 2 diastolic dysfunction. Mild left atrial enlargement. Sclerotic aortic valve. Trivial tricuspid regurgitation.  Nuclear stress test 01/19/15:   Myocardial perfusion is normal. Small mild basal inferior fixed attenuation artifact. No reversible ischemia. This is a low  risk study. Overall left ventricular systolic function was normal. LV cavity size is normal. The left ventricular ejection fraction is mildly decreased (51%). There is no prior study for comparison.    Past Medical History:  Diagnosis Date  . Anemia 08/15/2009  . Anxiety   . Arthritis   . [name]'s esophagus 2007  . COLONIC POLYPS, ADENOMATOUS 08/01/2008, 2013, 2014  . COPD (chronic obstructive pulmonary disease) (HCC)    no per pt  . Depression   . Diabetes type 2, controlled ([place]) 03/13/2007  . Diverticulitis   . ED (erectile dysfunction)   . EMPHYSEMA 11/08/2008   no per pt  . Esophageal cancer ([place]) "dx'd ~ 01/2015"  . GERD 03/13/2007  . GIB (gastrointestinal bleeding) 08/18/2015  . Hiatal hernia   . History of blood transfusion 08/18/2015   due to GIB  . HYPERCHOLESTEROLEMIA 02/03/2008  . HYPERTENSION 03/13/2007   pt denies, claims white coat syndrome  . Pneumonia   . Prostatism   . PULMONARY NODULE 11/24/2008  . Stomach cancer ([place]) "dx'd ~ 01/2015"  . TOBACCO ABUSE 11/24/2008    Past Surgical History:  Procedure Laterality Date  . BALLOON DILATION N/A 03/19/2016   Procedure: BALLOON DILATION;  Surgeon: Irene Shipper, MD;  Location: [facility];  Service: Endoscopy;  Laterality: N/A;  . CIRCUMCISION    . COLONOSCOPY    . COMPLETE ESOPHAGECTOMY N/A 07/25/2015   Procedure: TRANSHIATAL TOTAL ESOPHAGECTOMY COMPLETE PYLOROMYOTOMY;  Surgeon: [name], MD;  Location: [facility];  Service: Thoracic;  Laterality: N/A;  . EGD  11/19/2005  . ESOPHAGOGASTRODUODENOSCOPY N/A 03/19/2016   Procedure: ESOPHAGOGASTRODUODENOSCOPY (EGD);  Surgeon: Irene Shipper, MD;  Location: [facility];  Service: Endoscopy;  Laterality: N/A;  . ESOPHAGOGASTRODUODENOSCOPY (EGD) WITH PROPOFOL N/A 09/15/2017   Procedure: ESOPHAGOGASTRODUODENOSCOPY (EGD) WITH PROPOFOL;  Surgeon: Irene Shipper, MD;  Location: [facility];  Service: Endoscopy;  Laterality: N/A;  . EUS N/A 03/16/2015   Procedure: UPPER ENDOSCOPIC  ULTRASOUND (EUS) RADIAL;  Surgeon: Milus Banister, MD;  Location: [facility];  Service: Endoscopy;  Laterality: N/A;  . FOOT FRACTURE SURGERY Right 1980   right foot w/ pins and screws  . IR FLUORO GUIDE PORT INSERTION LEFT  01/05/2018  . IR US GUIDE VASC ACCESS LEFT  01/05/2018  . JEJUNOSTOMY N/A 07/25/2015   Procedure: FEEDING JEJUNOSTOMY;  Surgeon: [name], MD;  Location: [facility];  Service: Thoracic;  Laterality: N/A;  . POLYPECTOMY    . removal pins and screws foot  1980   right foot  . UPPER GASTROINTESTINAL ENDOSCOPY    . VASECTOMY    . VIDEO BRONCHOSCOPY N/A 07/25/2015   Procedure: VIDEO BRONCHOSCOPY;  Surgeon: [name], MD;  Location: [facility] OR;  Service: Thoracic;  Laterality: N/A;    MEDICATIONS: . diazepam (VALIUM) 5 MG tablet  . escitalopram (LEXAPRO) 20 MG tablet  . glucagon (GLUCAGON EMERGENCY) 1 MG injection  . glucose blood (PRECISION XTRA TEST STRIPS) test strip  . insulin NPH-regular Human (HUMULIN 70/30) (70-30) 100 UNIT/ML injection  . Insulin Syringe-Needle U-100 30G X 3/8" 1 ML MISC  . ketotifen (ZADITOR)  0.025 % ophthalmic solution  . lidocaine-prilocaine (EMLA) cream  . oxyCODONE (OXY IR/ROXICODONE) 5 MG immediate release tablet  . prochlorperazine (COMPAZINE) 5 MG tablet  . RABEprazole (ACIPHEX) 20 MG tablet  . sildenafil (REVATIO) 20 MG tablet  . sucralfate (CARAFATE) 1 g tablet  . topiramate (TOPAMAX) 50 MG tablet   No current facility-administered medications for this encounter.    Marland Kitchen 0.9 %  sodium chloride infusion    If no changes, I anticipate pt can proceed with surgery as scheduled.   Willeen Cass, FNP-BC [facility]/Anesthesiology Phone: [phone] 01/16/2018 10:59 AM

## 2018-01-22 ENCOUNTER — Ambulatory Visit (HOSPITAL_COMMUNITY)
Admission: RE | Admit: 2018-01-22 | Discharge: 2018-01-22 | Disposition: A | Discharge status: Home / Self Care | Payer: Medicare Other | Source: Ambulatory Visit | Attending: Otolaryngology | Admitting: Otolaryngology

## 2018-01-22 ENCOUNTER — Ambulatory Visit (HOSPITAL_COMMUNITY): Payer: Medicare Other | Admitting: Certified Registered"

## 2018-01-22 ENCOUNTER — Encounter (HOSPITAL_COMMUNITY)
Admission: RE | Disposition: A | Discharge status: Home / Self Care | Payer: Self-pay | Source: Ambulatory Visit | Attending: Otolaryngology

## 2018-01-22 ENCOUNTER — Ambulatory Visit (HOSPITAL_COMMUNITY): Payer: Medicare Other | Admitting: Emergency Medicine

## 2018-01-22 ENCOUNTER — Encounter (HOSPITAL_COMMUNITY): Payer: Self-pay | Admitting: *Deleted

## 2018-01-22 DIAGNOSIS — I5032 Chronic diastolic (congestive) heart failure: Secondary | ICD-10-CM | POA: Diagnosis not present

## 2018-01-22 DIAGNOSIS — Z794 Long term (current) use of insulin: Secondary | ICD-10-CM | POA: Insufficient documentation

## 2018-01-22 DIAGNOSIS — E7800 Pure hypercholesterolemia, unspecified: Secondary | ICD-10-CM | POA: Diagnosis not present

## 2018-01-22 DIAGNOSIS — K219 Gastro-esophageal reflux disease without esophagitis: Secondary | ICD-10-CM | POA: Insufficient documentation

## 2018-01-22 DIAGNOSIS — J449 Chronic obstructive pulmonary disease, unspecified: Secondary | ICD-10-CM | POA: Insufficient documentation

## 2018-01-22 DIAGNOSIS — J3800 Paralysis of vocal cords and larynx, unspecified: Secondary | ICD-10-CM | POA: Diagnosis not present

## 2018-01-22 DIAGNOSIS — J3801 Paralysis of vocal cords and larynx, unilateral: Secondary | ICD-10-CM | POA: Insufficient documentation

## 2018-01-22 DIAGNOSIS — Z87891 Personal history of nicotine dependence: Secondary | ICD-10-CM | POA: Insufficient documentation

## 2018-01-22 DIAGNOSIS — E119 Type 2 diabetes mellitus without complications: Secondary | ICD-10-CM | POA: Insufficient documentation

## 2018-01-22 DIAGNOSIS — I110 Hypertensive heart disease with heart failure: Secondary | ICD-10-CM | POA: Diagnosis not present

## 2018-01-22 DIAGNOSIS — I10 Essential (primary) hypertension: Secondary | ICD-10-CM | POA: Diagnosis not present

## 2018-01-22 DIAGNOSIS — E1151 Type 2 diabetes mellitus with diabetic peripheral angiopathy without gangrene: Secondary | ICD-10-CM | POA: Diagnosis not present

## 2018-01-22 DIAGNOSIS — F329 Major depressive disorder, single episode, unspecified: Secondary | ICD-10-CM | POA: Diagnosis not present

## 2018-01-22 DIAGNOSIS — Z79899 Other long term (current) drug therapy: Secondary | ICD-10-CM | POA: Insufficient documentation

## 2018-01-22 HISTORY — PX: MICROLARYNGOSCOPY W/VOCAL CORD INJECTION: SHX2665

## 2018-01-22 LAB — GLUCOSE, CAPILLARY
GLUCOSE-CAPILLARY: 154 mg/dL — AB (ref 65–99)
GLUCOSE-CAPILLARY: 146 mg/dL — AB (ref 65–99)

## 2018-01-22 SURGERY — MICROLARYNGOSCOPY, WITH VOCAL CORD INJECTION
Anesthesia: General | Site: Throat

## 2018-01-22 MED ORDER — DEXAMETHASONE SODIUM PHOSPHATE 10 MG/ML IJ SOLN
INTRAMUSCULAR | Status: DC | PRN
  Administered 2018-01-22: 10 mg via INTRAVENOUS

## 2018-01-22 MED ORDER — ONDANSETRON HCL 4 MG/2ML IJ SOLN
INTRAMUSCULAR | Status: AC
  Filled 2018-01-22: qty 2

## 2018-01-22 MED ORDER — ONDANSETRON HCL 4 MG/2ML IJ SOLN
INTRAMUSCULAR | Status: DC | PRN
  Administered 2018-01-22: 4 mg via INTRAVENOUS

## 2018-01-22 MED ORDER — LACTATED RINGERS IV SOLN
INTRAVENOUS | Status: DC
  Administered 2018-01-22: 12:00:00 via INTRAVENOUS

## 2018-01-22 MED ORDER — FENTANYL CITRATE (PF) 250 MCG/5ML IJ SOLN
INTRAMUSCULAR | Status: AC
  Filled 2018-01-22: qty 5

## 2018-01-22 MED ORDER — LIDOCAINE 2% (20 MG/ML) 5 ML SYRINGE
INTRAMUSCULAR | Status: DC | PRN
  Administered 2018-01-22: 60 mg via INTRAVENOUS

## 2018-01-22 MED ORDER — MIDAZOLAM HCL 2 MG/2ML IJ SOLN
INTRAMUSCULAR | Status: AC
  Filled 2018-01-22: qty 2

## 2018-01-22 MED ORDER — FENTANYL CITRATE (PF) 100 MCG/2ML IJ SOLN
25.0000 ug | INTRAMUSCULAR | Status: DC | PRN
  Administered 2018-01-22: 50 ug via INTRAVENOUS

## 2018-01-22 MED ORDER — EPINEPHRINE HCL (NASAL) 0.1 % NA SOLN
NASAL | Status: DC | PRN
  Administered 2018-01-22: 30 mL via TOPICAL

## 2018-01-22 MED ORDER — ROCURONIUM BROMIDE 10 MG/ML (PF) SYRINGE
PREFILLED_SYRINGE | INTRAVENOUS | Status: DC | PRN
  Administered 2018-01-22: 20 mg via INTRAVENOUS

## 2018-01-22 MED ORDER — EPHEDRINE SULFATE 50 MG/ML IJ SOLN
INTRAMUSCULAR | Status: DC | PRN
  Administered 2018-01-22: 10 mg via INTRAVENOUS

## 2018-01-22 MED ORDER — PHENYLEPHRINE 40 MCG/ML (10ML) SYRINGE FOR IV PUSH (FOR BLOOD PRESSURE SUPPORT)
PREFILLED_SYRINGE | INTRAVENOUS | Status: DC | PRN
  Administered 2018-01-22 (×2): 120 ug via INTRAVENOUS

## 2018-01-22 MED ORDER — EPHEDRINE SULFATE 50 MG/ML IJ SOLN
INTRAMUSCULAR | Status: AC
  Filled 2018-01-22: qty 1

## 2018-01-22 MED ORDER — FENTANYL CITRATE (PF) 100 MCG/2ML IJ SOLN
INTRAMUSCULAR | Status: AC
  Filled 2018-01-22: qty 2

## 2018-01-22 MED ORDER — DEXAMETHASONE SODIUM PHOSPHATE 10 MG/ML IJ SOLN
INTRAMUSCULAR | Status: AC
  Filled 2018-01-22: qty 1

## 2018-01-22 MED ORDER — ROCURONIUM BROMIDE 10 MG/ML (PF) SYRINGE
PREFILLED_SYRINGE | INTRAVENOUS | Status: AC
  Filled 2018-01-22: qty 5

## 2018-01-22 MED ORDER — EPINEPHRINE HCL (NASAL) 0.1 % NA SOLN
NASAL | Status: AC
  Filled 2018-01-22: qty 30

## 2018-01-22 MED ORDER — SUGAMMADEX SODIUM 200 MG/2ML IV SOLN
INTRAVENOUS | Status: DC | PRN
  Administered 2018-01-22: 186.4 mg via INTRAVENOUS

## 2018-01-22 MED ORDER — 0.9 % SODIUM CHLORIDE (POUR BTL) OPTIME
TOPICAL | Status: DC | PRN
  Administered 2018-01-22: 1000 mL

## 2018-01-22 MED ORDER — PROPOFOL 10 MG/ML IV BOLUS
INTRAVENOUS | Status: DC | PRN
  Administered 2018-01-22: 150 mg via INTRAVENOUS

## 2018-01-22 MED ORDER — SUGAMMADEX SODIUM 200 MG/2ML IV SOLN
INTRAVENOUS | Status: AC
  Filled 2018-01-22: qty 2

## 2018-01-22 MED ORDER — FENTANYL CITRATE (PF) 250 MCG/5ML IJ SOLN
INTRAMUSCULAR | Status: DC | PRN
  Administered 2018-01-22 (×2): 50 ug via INTRAVENOUS

## 2018-01-22 MED ORDER — LIDOCAINE 2% (20 MG/ML) 5 ML SYRINGE
INTRAMUSCULAR | Status: AC
  Filled 2018-01-22: qty 5

## 2018-01-22 MED ORDER — MIDAZOLAM HCL 5 MG/5ML IJ SOLN
INTRAMUSCULAR | Status: DC | PRN
  Administered 2018-01-22: 2 mg via INTRAVENOUS

## 2018-01-22 SURGICAL SUPPLY — 20 items
CANISTER SUCT 3000ML PPV (MISCELLANEOUS) ×2 IMPLANT
CONT SPEC 4OZ CLIKSEAL STRL BL (MISCELLANEOUS) IMPLANT
COVER BACK TABLE 60X90IN (DRAPES) ×2 IMPLANT
CRADLE DONUT ADULT HEAD (MISCELLANEOUS) IMPLANT
DRAPE HALF SHEET 40X57 (DRAPES) ×2 IMPLANT
GAUZE SPONGE 4X4 12PLY STRL (GAUZE/BANDAGES/DRESSINGS) ×2 IMPLANT
GLOVE SS BIOGEL STRL SZ 7.5 (GLOVE) ×1 IMPLANT
GLOVE SUPERSENSE BIOGEL SZ 7.5 (GLOVE) ×1
GUARD TEETH (MISCELLANEOUS) IMPLANT
KIT BASIN OR (CUSTOM PROCEDURE TRAY) IMPLANT
KIT PROLARN PLUS GEL W/NDL (Prosthesis and Implant ENT) ×2 IMPLANT
KIT TURNOVER KIT B (KITS) ×2 IMPLANT
NEEDLE HYPO 25GX1X1/2 BEV (NEEDLE) IMPLANT
NS IRRIG 1000ML POUR BTL (IV SOLUTION) ×2 IMPLANT
PAD ARMBOARD 7.5X6 YLW CONV (MISCELLANEOUS) ×4 IMPLANT
PATTIES SURGICAL .5 X1 (DISPOSABLE) IMPLANT
SPECIMEN JAR SMALL (MISCELLANEOUS) ×2 IMPLANT
TOWEL NATURAL 6PK STERILE (DISPOSABLE) ×2 IMPLANT
TUBE CONNECTING 12X1/4 (SUCTIONS) ×2 IMPLANT
WATER STERILE IRR 1000ML POUR (IV SOLUTION) ×2 IMPLANT

## 2018-01-22 NOTE — Op Note (Signed)
Preop/postop diagnosis: Right vocal cord paralysis Procedure: Microlaryngoscopy with right vocal cord injection Anesthesia General Estimated blood loss less than 5 cc Indications: Patient is a  cancer patient with a vocal cord paralysis that is causing him dysphagia as well as severe voice problems.  He wants to have an injection   to try to improve these features.  He was informed risk and benefits of the procedure and options were discussed all questions were answered and consent was obtained.. Procedure: Patient was taken to the operating room placed in the supine position after general Endotracheal tube anesthesia was placed in the supine position.  The Dedo scope was then inserted and the larynx was examined.  The right vocal cord was then inserted and the larynx was examined.The right vocal cord was then injected laterally.  0.3 cc was injected.  This bulged the vocal cord medially to about midline. There was minimal to no bleeding. The left cord look normal.  He was then awakened brought to recovery in stable condition counts correcty

## 2018-01-22 NOTE — Anesthesia Preprocedure Evaluation (Addendum)
Anesthesia Evaluation  Patient identified by MRN, date of birth, ID band Patient awake    Reviewed: Allergy & Precautions, H&P , NPO status , Patient's Chart, lab work & pertinent test results  Airway Mallampati: II  TM Distance: >3 FB Neck ROM: Full    Dental no notable dental hx. (+) Dental Advisory Given, Partial Lower, Partial Upper   Pulmonary COPD, former smoker,    Pulmonary exam normal breath sounds clear to auscultation       Cardiovascular hypertension, + Peripheral Vascular Disease   Rhythm:Regular Rate:Normal     Neuro/Psych  Headaches, Anxiety Depression    GI/Hepatic Neg liver ROS, hiatal hernia, GERD  Medicated and Controlled,  Endo/Other  diabetes, Insulin Dependent  Renal/GU negative Renal ROS  negative genitourinary   Musculoskeletal  (+) Arthritis , Osteoarthritis,    Abdominal   Peds  Hematology negative hematology ROS (+) anemia ,   Anesthesia Other Findings   Reproductive/Obstetrics negative OB ROS                           Anesthesia Physical Anesthesia Plan  ASA: III  Anesthesia Plan: General   Post-op Pain Management:    Induction: Intravenous  PONV Risk Score and Plan: 3 and Ondansetron and Midazolam  Airway Management Planned: Oral ETT  Additional Equipment:   Intra-op Plan:   Post-operative Plan: Extubation in OR  Informed Consent: I have reviewed the patients History and Physical, chart, labs and discussed the procedure including the risks, benefits and alternatives for the proposed anesthesia with the patient or authorized representative who has indicated his/her understanding and acceptance.   Dental advisory given  Plan Discussed with: CRNA  Anesthesia Plan Comments:         Anesthesia Quick Evaluation

## 2018-01-22 NOTE — Discharge Instructions (Signed)
Call for an appointment in 2 weeks. Call if any breathing problems. The voice will be hoarse. Regular diet is ok. Call if any problems like bleeding or breathing issues or otherwise.

## 2018-01-22 NOTE — Anesthesia Procedure Notes (Signed)
Procedure Name: Intubation Date/Time: 01/22/2018 1:18 PM Performed by: Freddie Breech, CRNA Pre-anesthesia Checklist: Patient identified, Emergency Drugs available, Suction available and Patient being monitored Patient Re-evaluated:Patient Re-evaluated prior to induction Oxygen Delivery Method: Circle System Utilized Preoxygenation: Pre-oxygenation with 100% oxygen Induction Type: IV induction Ventilation: Mask ventilation without difficulty and Oral airway inserted - appropriate to patient size Laryngoscope Size: Mac and 4 Grade View: Grade I Tube type: Oral Tube size: 6.0 mm Number of attempts: 1 Airway Equipment and Method: Stylet and Oral airway Placement Confirmation: ETT inserted through vocal cords under direct vision,  positive ETCO2 and breath sounds checked- equal and bilateral Secured at: 23 cm Tube secured with: Tape Dental Injury: Teeth and Oropharynx as per pre-operative assessment

## 2018-01-22 NOTE — Anesthesia Postprocedure Evaluation (Signed)
Anesthesia Post Note  Patient: Ronald Johnson  Procedure(s) Performed: MICROLARYNGOSCOPY WITH VOCAL CORD INJECTION (N/A Throat)     Patient location during evaluation: PACU Anesthesia Type: General Level of consciousness: awake and alert Pain management: pain level controlled Vital Signs Assessment: post-procedure vital signs reviewed and stable Respiratory status: spontaneous breathing, nonlabored ventilation and respiratory function stable Cardiovascular status: blood pressure returned to baseline and stable Postop Assessment: no apparent nausea or vomiting Anesthetic complications: no    Last Vitals:  Vitals:   01/22/18 1415 01/22/18 1430  BP: 128/79 137/78  Pulse: 86 86  Resp: (!) 22 16  Temp: 36.6 C   SpO2: 94% 93%    Last Pain:  Vitals:   01/22/18 1430  TempSrc:   PainSc: 2                  Luisf[place]ipe [place],W. EDMOND

## 2018-01-22 NOTE — Transfer of Care (Signed)
Immediate Anesthesia Transfer of Care Note  Patient: Ronald Johnson  Procedure(s) Performed: MICROLARYNGOSCOPY WITH VOCAL CORD INJECTION (N/A Throat)  Patient Location: PACU  Anesthesia Type:General  Level of Consciousness: drowsy and patient cooperative  Airway & Oxygen Therapy: Patient Spontanous Breathing and Patient connected to face mask oxygen  Post-op Assessment: Report given to RN, Post -op Vital signs reviewed and stable and Patient moving all extremities X 4  Post vital signs: Reviewed and stable  Last Vitals:  Vitals Value Taken Time  BP 153/79 01/22/2018  1:44 PM  Temp 36.1 C 01/22/2018  1:44 PM  Pulse 88 01/22/2018  1:46 PM  Resp 17 01/22/2018  1:46 PM  SpO2 100 % 01/22/2018  1:46 PM  Vitals shown include unvalidated device data.  Last Pain:  Vitals:   01/22/18 1138  TempSrc:   PainSc: 0-No pain      Patients Stated Pain Goal: 5 (50/93/26 7124)  Complications: No apparent anesthesia complications

## 2018-01-22 NOTE — H&P (Signed)
Ronald Johnson is an 72 y.o. male.   Chief Complaint: hoarseness HPI: hx of right vocal cord paralysis and wants voice and swallowing improvement  Past Medical History:  Diagnosis Date  . Anemia 08/15/2009  . Anxiety   . Arthritis   . [name]'s esophagus 2007  . COLONIC POLYPS, ADENOMATOUS 08/01/2008, 2013, 2014  . COPD (chronic obstructive pulmonary disease) (HCC)    no per pt  . Depression   . Diabetes type 2, controlled ([place]) 03/13/2007  . Diverticulitis   . ED (erectile dysfunction)   . EMPHYSEMA 11/08/2008   no per pt  . Esophageal cancer ([place]) "dx'd ~ 01/2015"  . GERD 03/13/2007  . GIB (gastrointestinal bleeding) 08/18/2015  . Hiatal hernia   . History of blood transfusion 08/18/2015   due to GIB  . HYPERCHOLESTEROLEMIA 02/03/2008  . HYPERTENSION 03/13/2007   pt denies, claims white coat syndrome  . Pneumonia   . Prostatism   . PULMONARY NODULE 11/24/2008  . Stomach cancer ([place]) "dx'd ~ 01/2015"  . TOBACCO ABUSE 11/24/2008    Past Surgical History:  Procedure Laterality Date  . BALLOON DILATION N/A 03/19/2016   Procedure: BALLOON DILATION;  Surgeon: Irene Shipper, MD;  [facility];  Service: Endoscopy;  Laterality: N/A;  . CIRCUMCISION    . COLONOSCOPY    . COMPLETE ESOPHAGECTOMY N/A 07/25/2015   Procedure: TRANSHIATAL TOTAL ESOPHAGECTOMY COMPLETE PYLOROMYOTOMY;  Surgeon: [name], MD;  Location: [facility];  Service: Thoracic;  Laterality: N/A;  . EGD  11/19/2005  . ESOPHAGOGASTRODUODENOSCOPY N/A 03/19/2016   Procedure: ESOPHAGOGASTRODUODENOSCOPY (EGD);  Surgeon: Irene Shipper, MD;  Location: [facility];  Service: Endoscopy;  Laterality: N/A;  . ESOPHAGOGASTRODUODENOSCOPY (EGD) WITH PROPOFOL N/A 09/15/2017   Procedure: ESOPHAGOGASTRODUODENOSCOPY (EGD) WITH PROPOFOL;  Surgeon: Irene Shipper, MD;  [facility];  Service: Endoscopy;  Laterality: N/A;  . EUS N/A 03/16/2015   Procedure: UPPER ENDOSCOPIC ULTRASOUND (EUS) RADIAL;  Surgeon: Milus Banister, MD;   Location[facility];  Service: Endoscopy;  Laterality: N/A;  . FOOT FRACTURE SURGERY Right 1980   right foot w/ pins and screws  . IR FLUORO GUIDE PORT INSERTION LEFT  01/05/2018  . IR US GUIDE VASC ACCESS LEFT  01/05/2018  . JEJUNOSTOMY N/A 07/25/2015   Procedure: FEEDING JEJUNOSTOMY;  Surgeon: [name], MD;  Location: [facility];  Service: Thoracic;  Laterality: N/A;  . POLYPECTOMY    . removal pins and screws foot  1980   right foot  . UPPER GASTROINTESTINAL ENDOSCOPY    . VASECTOMY    . VIDEO BRONCHOSCOPY N/A 07/25/2015   Procedure: VIDEO BRONCHOSCOPY;  Surgeon: [name], MD;  Location: [facility] OR;  Service: Thoracic;  Laterality: N/A;    Family History  Problem Relation Age of Onset  . Stroke Mother   . Diabetes Mother   . Diabetes Maternal Grandmother        mother side of the family aunts, MGF  . Breast cancer Paternal Grandmother   . AAA (abdominal aortic aneurysm) Father   . Colon cancer Neg Hx   . Esophageal cancer Neg Hx   . Rectal cancer Neg Hx   . Stomach cancer Neg Hx    Social History:  reports that he quit smoking about 9 years ago. His smoking use included cigarettes. He has a 35.00 pack-year smoking history. He has never used smokeless tobacco. He reports that he does not drink alcohol or use drugs.  Allergies: No Known Allergies  Medications  Prior to Admission  Medication Sig Dispense Refill  . diazepam (VALIUM) 5 MG tablet Take 5 mg by mouth every 6 (six) hours as needed for anxiety.    Marland Kitchen escitalopram (LEXAPRO) 20 MG tablet Take 1 tablet (20 mg total) by mouth daily. 90 tablet 3  . glucagon (GLUCAGON EMERGENCY) 1 MG injection Inject 1 mg into the vein once as needed for up to 1 dose. (Patient taking differently: Inject 1 mg into the vein once as needed (for low blood sugars). ) 1 each 12  . glucose blood (PRECISION XTRA TEST STRIPS) test strip Check blood sugar three times a day dx 250.01 270 each 3  . insulin NPH-regular Human (HUMULIN 70/30) (70-30)  100 UNIT/ML injection Inject 70 Units into the skin daily with breakfast. 3 vial 11  . Insulin Syringe-Needle U-100 30G X 3/8" 1 ML MISC Use to inject insulin 3 times daily. 300 each 1  . ketotifen (ZADITOR) 0.025 % ophthalmic solution Place 1 drop into both eyes 2 (two) times daily.    Marland Kitchen lidocaine-prilocaine (EMLA) cream Apply to Port-A-Cath site 1 hour prior to use 30 g 2  . prochlorperazine (COMPAZINE) 5 MG tablet Take 1 tablet (5 mg total) by mouth every 6 (six) hours as needed for nausea or vomiting. 30 tablet 2  . RABEprazole (ACIPHEX) 20 MG tablet TAKE 1 TABLET(20 MG) BY MOUTH TWICE DAILY 180 tablet 1  . sucralfate (CARAFATE) 1 g tablet Take 1 tablet (1 g total) by mouth 4 (four) times daily -  with meals and at bedtime. Crush and mix in 1 oz of water to take 120 tablet 1  . topiramate (TOPAMAX) 50 MG tablet 2 in the AM, 1 at night (Patient taking differently: Take 50-100 mg by mouth See admin instructions. 2 in the AM, 1 at night) 270 tablet 1  . oxyCODONE (OXY IR/ROXICODONE) 5 MG immediate release tablet Take 1 tablet (5 mg total) by mouth daily as needed for severe pain. 30 tablet 0  . sildenafil (REVATIO) 20 MG tablet 1-5 tabs as needed for ED symptoms (Patient taking differently: Take 20-100 mg by mouth daily as needed (for ED). ) 100 tablet 11    Results for orders placed or performed during the hospital encounter of 01/22/18 (from the past 48 hour(s))  Glucose, capillary     Status: Abnormal   Collection Time: 01/22/18 11:09 AM  Result Value Ref Range   Glucose-Capillary 154 (H) 65 - 99 mg/dL   No results found.  Review of Systems  Constitutional: Negative.   HENT: Negative.   Eyes: Negative.   Respiratory: Negative.   Cardiovascular: Negative.   Skin: Negative.     Blood pressure 137/71, pulse 82, temperature 98.2 F (36.8 C), temperature source Oral, resp. rate 18, height 6\' 4"  (1.93 m), weight 93.2 kg (205 lb 8 oz), SpO2 96 %. Physical Exam  Constitutional: He appears  well-developed and well-nourished.  HENT:  Head: Normocephalic and atraumatic.  Nose: Nose normal.  Mouth/Throat: Oropharynx is clear and moist.  Eyes: Pupils are equal, round, and reactive to light. Conjunctivae are normal.  Neck: Normal range of motion. Neck supple.  Cardiovascular: Normal rate.  Respiratory: Effort normal.  GI: Soft.  Musculoskeletal: Normal range of motion.     Assessment/Plan Right cord paralysis= we discussed injection and ready to proceed  Melissa Montane, MD 01/22/2018, 12:54 PM

## 2018-01-23 ENCOUNTER — Encounter (HOSPITAL_COMMUNITY): Payer: Self-pay | Admitting: Otolaryngology

## 2018-01-25 ENCOUNTER — Other Ambulatory Visit: Payer: Self-pay | Admitting: Oncology

## 2018-01-26 ENCOUNTER — Telehealth: Payer: Self-pay | Admitting: Nurse Practitioner

## 2018-01-26 ENCOUNTER — Inpatient Hospital Stay: Payer: Medicare Other

## 2018-01-26 ENCOUNTER — Encounter: Payer: Self-pay | Admitting: Nurse Practitioner

## 2018-01-26 ENCOUNTER — Inpatient Hospital Stay: Payer: Medicare Other | Attending: Nurse Practitioner | Admitting: Nurse Practitioner

## 2018-01-26 VITALS — BP 134/64 | HR 95 | Temp 97.7°F | Resp 19 | Ht 76.0 in | Wt 204.1 lb

## 2018-01-26 DIAGNOSIS — C160 Malignant neoplasm of cardia: Secondary | ICD-10-CM

## 2018-01-26 DIAGNOSIS — E1165 Type 2 diabetes mellitus with hyperglycemia: Secondary | ICD-10-CM | POA: Insufficient documentation

## 2018-01-26 DIAGNOSIS — J449 Chronic obstructive pulmonary disease, unspecified: Secondary | ICD-10-CM | POA: Diagnosis not present

## 2018-01-26 DIAGNOSIS — D6481 Anemia due to antineoplastic chemotherapy: Secondary | ICD-10-CM | POA: Insufficient documentation

## 2018-01-26 DIAGNOSIS — Z95828 Presence of other vascular implants and grafts: Secondary | ICD-10-CM

## 2018-01-26 DIAGNOSIS — Z923 Personal history of irradiation: Secondary | ICD-10-CM

## 2018-01-26 DIAGNOSIS — I739 Peripheral vascular disease, unspecified: Secondary | ICD-10-CM | POA: Diagnosis not present

## 2018-01-26 DIAGNOSIS — D701 Agranulocytosis secondary to cancer chemotherapy: Secondary | ICD-10-CM | POA: Insufficient documentation

## 2018-01-26 DIAGNOSIS — Z452 Encounter for adjustment and management of vascular access device: Secondary | ICD-10-CM | POA: Insufficient documentation

## 2018-01-26 DIAGNOSIS — I10 Essential (primary) hypertension: Secondary | ICD-10-CM | POA: Diagnosis not present

## 2018-01-26 DIAGNOSIS — E1140 Type 2 diabetes mellitus with diabetic neuropathy, unspecified: Secondary | ICD-10-CM | POA: Diagnosis not present

## 2018-01-26 DIAGNOSIS — C159 Malignant neoplasm of esophagus, unspecified: Secondary | ICD-10-CM

## 2018-01-26 DIAGNOSIS — Z79899 Other long term (current) drug therapy: Secondary | ICD-10-CM | POA: Insufficient documentation

## 2018-01-26 DIAGNOSIS — R1310 Dysphagia, unspecified: Secondary | ICD-10-CM | POA: Diagnosis not present

## 2018-01-26 DIAGNOSIS — T451X5S Adverse effect of antineoplastic and immunosuppressive drugs, sequela: Secondary | ICD-10-CM | POA: Diagnosis not present

## 2018-01-26 DIAGNOSIS — Z5111 Encounter for antineoplastic chemotherapy: Secondary | ICD-10-CM | POA: Insufficient documentation

## 2018-01-26 LAB — CBC WITH DIFFERENTIAL (CANCER CENTER ONLY)
Basophils Absolute: 0 10*3/uL (ref 0.0–0.1)
Basophils Relative: 1 %
EOS ABS: 0.2 10*3/uL (ref 0.0–0.5)
EOS PCT: 10 %
HCT: 35.8 % — ABNORMAL LOW (ref 38.4–49.9)
Hemoglobin: 12 g/dL — ABNORMAL LOW (ref 13.0–17.1)
LYMPHS ABS: 1 10*3/uL (ref 0.9–3.3)
Lymphocytes Relative: 43 %
MCH: 33.8 pg — AB (ref 27.2–33.4)
MCHC: 33.5 g/dL (ref 32.0–36.0)
MCV: 101.1 fL — ABNORMAL HIGH (ref 79.3–98.0)
Monocytes Absolute: 0.3 10*3/uL (ref 0.1–0.9)
Monocytes Relative: 14 %
Neutro Abs: 0.8 10*3/uL — ABNORMAL LOW (ref 1.5–6.5)
Neutrophils Relative %: 32 %
PLATELETS: 151 10*3/uL (ref 140–400)
RBC: 3.54 MIL/uL — AB (ref 4.20–5.82)
RDW: 14.1 % (ref 11.0–14.6)
WBC: 2.4 10*3/uL — AB (ref 4.0–10.3)

## 2018-01-26 LAB — CMP (CANCER CENTER ONLY)
ALT: 22 U/L (ref 0–55)
AST: 23 U/L (ref 5–34)
Albumin: 3.5 g/dL (ref 3.5–5.0)
Alkaline Phosphatase: 108 U/L (ref 40–150)
Anion gap: 7 (ref 3–11)
BUN: 15 mg/dL (ref 7–26)
CHLORIDE: 106 mmol/L (ref 98–109)
CO2: 25 mmol/L (ref 22–29)
CREATININE: 1.04 mg/dL (ref 0.70–1.30)
Calcium: 9.7 mg/dL (ref 8.4–10.4)
GFR, Est AFR Am: >60 mL/min (ref >60)
Glucose, Bld: 165 mg/dL — ABNORMAL HIGH (ref 70–140)
Potassium: 3.8 mmol/L (ref 3.5–5.1)
Sodium: 138 mmol/L (ref 136–145)
Total Bilirubin: 0.4 mg/dL (ref 0.2–1.2)
Total Protein: 6.9 g/dL (ref 6.4–8.3)

## 2018-01-26 MED ORDER — HEPARIN SOD (PORK) LOCK FLUSH 100 UNIT/ML IV SOLN
500.0000 [IU] | Freq: Once | INTRAVENOUS | Status: AC
  Administered 2018-01-26: 500 [IU] via INTRAVENOUS
  Filled 2018-01-26: qty 5

## 2018-01-26 MED ORDER — SODIUM CHLORIDE 0.9% FLUSH
10.0000 mL | INTRAVENOUS | Status: DC | PRN
  Administered 2018-01-26: 10 mL
  Filled 2018-01-26: qty 10

## 2018-01-26 NOTE — Addendum Note (Signed)
Addended by: Betsy Coder B on: 01/26/2018 05:45 PM   Modules accepted: Orders

## 2018-01-26 NOTE — Progress Notes (Signed)
[place] OFFICE PROGRESS NOTE   Diagnosis: Esophagus cancer  INTERVAL HISTORY:   Ronald Johnson returns as scheduled.  He completed cycle 1 FOLFOX 01/08/2018.  He denies nausea/vomiting.  No mouth sores.  No diarrhea.  He avoid cold exposure for about 5 days.  He then resumed cold with no problems.  He is tolerating liquids without difficulty.  He continues to have dysphagia with certain solids.  Objective:  Vital signs in last 24 hours:  Blood pressure 134/64, pulse 95, temperature 97.7 F (36.5 C), temperature source Oral, resp. rate 19, height 6\' 4"  (1.93 m), weight 204 lb 1.6 oz (92.6 kg), SpO2 97 %.    HEENT: No thrush or ulcers. Resp: Distant breath sounds.  No respiratory distress. Cardio: Regular rate and rhythm. GI: Abdomen soft and nontender.  No hepatomegaly. Vascular: No leg edema. Port-A-Cath without erythema.  Lab Results:  Lab Results  Component Value Date   WBC 2.4 (L) 01/26/2018   HGB 12.0 (L) 01/26/2018   HCT 35.8 (L) 01/26/2018   MCV 101.1 (H) 01/26/2018   PLT 151 01/26/2018   NEUTROABS 0.8 (L) 01/26/2018    Imaging:  No results found.  Medications: I have reviewed the patient's current medications.  Assessment/Plan: 1. 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 EUS 03/16/2015 confirmed a clinical stage IIb (uT3,uN1) tumor ? Staging CTs of the chest, abdomen, and pelvis with no evidence of metastatic disease ? Initiation of radiation 04/19/2015, cycle 1 Taxol/carboplatin 04/20/2015: Radiation completed 05/29/2015; the fifth and final week of Taxol/carboplatin 05/18/2015 ? Restaging CTs 07/20/2015 revealed no evidence of metastatic disease, incidental left lower lobe pulmonary embolism ? Transhiatal total esophagectomy 07/25/2015 confirmed a ypT3, ypN2 with 4/5 positive lymph nodes, lymphovascular invasion, perineural invasion, negative resection margins ? PD1 score-0 ? CT neck 07/25/2017-4-5 cm  mass in the right lower neck upper mediastinum immediately adjacent to the esophageal anastomosis. 12 mm mass within the right parotid enlarged since the previous PET study where it was about half that size. Question few small pulmonary nodules. Borderline enlarged mediastinal lymph nodes. ? CTs brain/chest/abdomen/pelvis 08/06/2017-ill-defined mass at the right thoracic inlet extending into the right superior mediastinum; new bilateral but right much greater than left peripheral reticulonodular and groundglass opacities; scattered small paratracheal and anterior mediastinal lymph nodes; small bilateral axillary lymph nodes. Brain CT with no evidence of metastasis. ? 08/26/2017 biopsy right supraclavicular lymph node-very scant fragment of benign lymph node. ? PET scan 09/11/2017-poorly marginated heterogeneously hypermetabolic soft tissue mass at the right thoracic inlet extending laterally from the esophagogastric anastomosis in the neck. No definite hypermetabolic metastatic disease.Patchy tree-in-bud opacities throughout the lungs significantly decreased. Persistent scattered subcentimeter solid pulmonary nodules in both lungs stable to decreased. Stable irregular 1.7 cm nodular bandlike opacity anterior left lower lobe with associated low-level metabolism. Nonspecific mildly hypermetabolic subcarinal and bilateral hilar lymph nodes. Nonspecific mildly hypermetabolic nodular focus of soft tissue in the upper left retroperitoneum anterior to the adrenal gland. ? Upper endoscopy 09/15/2017-at 20 cm from the incisors tight stricture at the anastomosis measuring only a few millimeters in diameter. Surrounding tissues firm, slightly friable, noncompliant. Stricture was dilated with a 15 mm balloon several times. Diameter of stricture improved but still would not permit passage of a standard upper endoscope. Friable areas biopsied-invasive adenocarcinoma. ? Initiation of radiation and concurrent  capecitabine 2/4/2019completed 10/30/2017 ? CTs neck and chest 12/23/2017- stable abnormal soft tissue at the right thoracic inlet with encasement of the proximal right common carotid  artery, interval development of sclerotic lesions at C7 and T1, new indeterminate right lung opacities ? Cycle 1 FOLFOX 01/08/2018  2. Postprandial subxiphoid pain secondary to #1  3. Diabetes-elevated blood sugar readings at the [facility] and at home  4. COPD  5. Depression-Improved with Wellbutrin  6. Hypertension  7. History of adenomatous colon polyps  8. History of diabetic related neuropathy, peripheral arterial disease, and nephropathy  9. History of nausea-likely related to radiation esophagitis/gastritis  10. Incidental pulmonary embolism noted on the CT 07/20/2015, treated with Xarelto  11. Admission 08/18/2015 with GI bleeding while on Xarelto, anticoagulation therapy discontinued  12. Dysphagia-improved with esophageal dilatation procedures by Dr. Henrene Pastor, progressive dysphagia secondary to local recurrence of esophagus cancer with obstruction at the esophagus anastomosis  13.History ofmild thrombocytopenia  14. CT scan abdomen/pelvis 10/09/2016 (done to evaluate persistent nausea, epigastric pain)-no acute findings or explanation for the patient's symptoms. Resolving postsurgical changes from presumed previous esophagectomy and gastric pull-through. No evidence of metastatic disease.  15.  Anemia secondary to chemotherapy 01/26/2018.     Disposition: Ronald Johnson appears stable.  He has completed 1 cycle of FOLFOX which he tolerated well.  CBC from today shows neutropenia.  We are holding today's treatment and rescheduling for 1 week.  Neutropenic precautions reviewed.  He understands to contact the office with fever, chills, other signs of infection.  Plan to dose reduce the Oxaliplatin with cycle 2.  He will return for cycle 2 FOLFOX on 02/02/2018.  We will  see him in follow-up prior to cycle 3 on 02/16/2018.  He will contact the office in the interim as outlined above or with any other problems.  Plan reviewed with Dr. [name].    Ronald Johnson ANP/GNP-BC   01/26/2018  10:47 AM

## 2018-01-26 NOTE — Telephone Encounter (Signed)
Appointment scheduled AVS/Calendar printed per 6/10 los

## 2018-01-28 ENCOUNTER — Inpatient Hospital Stay: Payer: Medicare Other

## 2018-02-02 ENCOUNTER — Inpatient Hospital Stay: Payer: Medicare Other

## 2018-02-02 VITALS — BP 134/74 | HR 90 | Temp 97.7°F | Resp 16

## 2018-02-02 DIAGNOSIS — D701 Agranulocytosis secondary to cancer chemotherapy: Secondary | ICD-10-CM | POA: Diagnosis not present

## 2018-02-02 DIAGNOSIS — Z923 Personal history of irradiation: Secondary | ICD-10-CM | POA: Diagnosis not present

## 2018-02-02 DIAGNOSIS — C159 Malignant neoplasm of esophagus, unspecified: Secondary | ICD-10-CM

## 2018-02-02 DIAGNOSIS — C160 Malignant neoplasm of cardia: Secondary | ICD-10-CM | POA: Diagnosis not present

## 2018-02-02 DIAGNOSIS — Z95828 Presence of other vascular implants and grafts: Secondary | ICD-10-CM

## 2018-02-02 DIAGNOSIS — D6481 Anemia due to antineoplastic chemotherapy: Secondary | ICD-10-CM | POA: Diagnosis not present

## 2018-02-02 DIAGNOSIS — Z5111 Encounter for antineoplastic chemotherapy: Secondary | ICD-10-CM | POA: Diagnosis not present

## 2018-02-02 DIAGNOSIS — Z452 Encounter for adjustment and management of vascular access device: Secondary | ICD-10-CM | POA: Diagnosis not present

## 2018-02-02 LAB — CMP (CANCER CENTER ONLY)
ALK PHOS: 103 U/L (ref 40–150)
ALT: 10 U/L (ref 0–55)
ANION GAP: 8 (ref 3–11)
AST: 13 U/L (ref 5–34)
Albumin: 3.4 g/dL — ABNORMAL LOW (ref 3.5–5.0)
BILIRUBIN TOTAL: 0.2 mg/dL (ref 0.2–1.2)
BUN: 17 mg/dL (ref 7–26)
CALCIUM: 9.6 mg/dL (ref 8.4–10.4)
CO2: 27 mmol/L (ref 22–29)
CREATININE: 1.17 mg/dL (ref 0.70–1.30)
Chloride: 105 mmol/L (ref 98–109)
GFR, Estimated: >60 mL/min (ref >60)
GLUCOSE: 223 mg/dL — AB (ref 70–140)
Potassium: 3.8 mmol/L (ref 3.5–5.1)
SODIUM: 140 mmol/L (ref 136–145)
TOTAL PROTEIN: 6.9 g/dL (ref 6.4–8.3)

## 2018-02-02 LAB — CBC WITH DIFFERENTIAL (CANCER CENTER ONLY)
Basophils Absolute: 0 10*3/uL (ref 0.0–0.1)
Basophils Relative: 1 %
EOS ABS: 0.2 10*3/uL (ref 0.0–0.5)
Eosinophils Relative: 4 %
HEMATOCRIT: 35.8 % — AB (ref 38.4–49.9)
HEMOGLOBIN: 11.8 g/dL — AB (ref 13.0–17.1)
LYMPHS ABS: 1.2 10*3/uL (ref 0.9–3.3)
LYMPHS PCT: 27 %
MCH: 33.5 pg — AB (ref 27.2–33.4)
MCHC: 33 g/dL (ref 32.0–36.0)
MCV: 101.7 fL — ABNORMAL HIGH (ref 79.3–98.0)
MONOS PCT: 8 %
Monocytes Absolute: 0.4 10*3/uL (ref 0.1–0.9)
NEUTROS ABS: 2.7 10*3/uL (ref 1.5–6.5)
NEUTROS PCT: 60 %
Platelet Count: 122 10*3/uL — ABNORMAL LOW (ref 140–400)
RBC: 3.52 MIL/uL — AB (ref 4.20–5.82)
RDW: 13.7 % (ref 11.0–14.6)
WBC: 4.5 10*3/uL (ref 4.0–10.3)

## 2018-02-02 MED ORDER — OXALIPLATIN CHEMO INJECTION 100 MG/20ML
60.0000 mg/m2 | Freq: Once | INTRAVENOUS | Status: AC
  Administered 2018-02-02: 135 mg via INTRAVENOUS
  Filled 2018-02-02: qty 27

## 2018-02-02 MED ORDER — PALONOSETRON HCL INJECTION 0.25 MG/5ML
INTRAVENOUS | Status: AC
  Filled 2018-02-02: qty 5

## 2018-02-02 MED ORDER — FLUOROURACIL CHEMO INJECTION 2.5 GM/50ML
400.0000 mg/m2 | Freq: Once | INTRAVENOUS | Status: AC
  Administered 2018-02-02: 900 mg via INTRAVENOUS
  Filled 2018-02-02: qty 18

## 2018-02-02 MED ORDER — DEXAMETHASONE SODIUM PHOSPHATE 10 MG/ML IJ SOLN
10.0000 mg | Freq: Once | INTRAMUSCULAR | Status: AC
  Administered 2018-02-02: 10 mg via INTRAVENOUS

## 2018-02-02 MED ORDER — LEUCOVORIN CALCIUM INJECTION 350 MG
400.0000 mg/m2 | Freq: Once | INTRAVENOUS | Status: AC
  Administered 2018-02-02: 896 mg via INTRAVENOUS
  Filled 2018-02-02: qty 44.8

## 2018-02-02 MED ORDER — PALONOSETRON HCL INJECTION 0.25 MG/5ML
0.2500 mg | Freq: Once | INTRAVENOUS | Status: AC
  Administered 2018-02-02: 0.25 mg via INTRAVENOUS

## 2018-02-02 MED ORDER — SODIUM CHLORIDE 0.9% FLUSH
10.0000 mL | INTRAVENOUS | Status: DC | PRN
  Administered 2018-02-02: 10 mL
  Filled 2018-02-02: qty 10

## 2018-02-02 MED ORDER — DEXAMETHASONE SODIUM PHOSPHATE 10 MG/ML IJ SOLN
INTRAMUSCULAR | Status: AC
  Filled 2018-02-02: qty 1

## 2018-02-02 MED ORDER — DEXTROSE 5 % IV SOLN
Freq: Once | INTRAVENOUS | Status: AC
  Administered 2018-02-02: 13:00:00 via INTRAVENOUS

## 2018-02-02 MED ORDER — SODIUM CHLORIDE 0.9 % IV SOLN
2400.0000 mg/m2 | INTRAVENOUS | Status: DC
  Administered 2018-02-02: 5400 mg via INTRAVENOUS
  Filled 2018-02-02: qty 100

## 2018-02-02 NOTE — Patient Instructions (Addendum)
[facility] Discharge Instructions for Patients Receiving Chemotherapy  Today you received the following chemotherapy agents oxaliplatin/leucovorin/florouracil   To help prevent nausea and vomiting after your treatment, we encourage you to take your nausea medication  As directed  If you develop nausea and vomiting that is not controlled by your nausea medication, call the clinic.   BELOW ARE SYMPTOMS THAT SHOULD BE REPORTED IMMEDIATELY:  *FEVER GREATER THAN 100.5 F  *CHILLS WITH OR WITHOUT FEVER  NAUSEA AND VOMITING THAT IS NOT CONTROLLED WITH YOUR NAUSEA MEDICATION  *UNUSUAL SHORTNESS OF BREATH  *UNUSUAL BRUISING OR BLEEDING  TENDERNESS IN MOUTH AND THROAT WITH OR WITHOUT PRESENCE OF ULCERS  *URINARY PROBLEMS  *BOWEL PROBLEMS  UNUSUAL RASH Items with * indicate a potential emergency and should be followed up as soon as possible.  Feel free to call the clinic you have any questions or concerns. The clinic phone number is [phone].

## 2018-02-04 ENCOUNTER — Inpatient Hospital Stay: Payer: Medicare Other

## 2018-02-04 VITALS — BP 125/70 | HR 88 | Temp 98.1°F | Resp 20

## 2018-02-04 DIAGNOSIS — Z923 Personal history of irradiation: Secondary | ICD-10-CM | POA: Diagnosis not present

## 2018-02-04 DIAGNOSIS — Z95828 Presence of other vascular implants and grafts: Secondary | ICD-10-CM

## 2018-02-04 DIAGNOSIS — C159 Malignant neoplasm of esophagus, unspecified: Secondary | ICD-10-CM

## 2018-02-04 DIAGNOSIS — Z5111 Encounter for antineoplastic chemotherapy: Secondary | ICD-10-CM | POA: Diagnosis not present

## 2018-02-04 DIAGNOSIS — D701 Agranulocytosis secondary to cancer chemotherapy: Secondary | ICD-10-CM | POA: Diagnosis not present

## 2018-02-04 DIAGNOSIS — Z452 Encounter for adjustment and management of vascular access device: Secondary | ICD-10-CM | POA: Diagnosis not present

## 2018-02-04 DIAGNOSIS — D6481 Anemia due to antineoplastic chemotherapy: Secondary | ICD-10-CM | POA: Diagnosis not present

## 2018-02-04 DIAGNOSIS — C160 Malignant neoplasm of cardia: Secondary | ICD-10-CM | POA: Diagnosis not present

## 2018-02-04 MED ORDER — SODIUM CHLORIDE 0.9% FLUSH
10.0000 mL | INTRAVENOUS | Status: DC | PRN
  Filled 2018-02-04: qty 10

## 2018-02-04 MED ORDER — SODIUM CHLORIDE 0.9% FLUSH
10.0000 mL | INTRAVENOUS | Status: DC | PRN
  Administered 2018-02-04: 10 mL
  Filled 2018-02-04: qty 10

## 2018-02-04 MED ORDER — HEPARIN SOD (PORK) LOCK FLUSH 100 UNIT/ML IV SOLN
500.0000 [IU] | Freq: Once | INTRAVENOUS | Status: DC | PRN
  Filled 2018-02-04: qty 5

## 2018-02-04 MED ORDER — HEPARIN SOD (PORK) LOCK FLUSH 100 UNIT/ML IV SOLN
500.0000 [IU] | Freq: Once | INTRAVENOUS | Status: AC | PRN
  Administered 2018-02-04: 500 [IU]
  Filled 2018-02-04: qty 5

## 2018-02-09 ENCOUNTER — Other Ambulatory Visit: Payer: Self-pay

## 2018-02-09 ENCOUNTER — Ambulatory Visit: Payer: Self-pay | Admitting: Oncology

## 2018-02-09 ENCOUNTER — Ambulatory Visit: Payer: Self-pay

## 2018-02-15 ENCOUNTER — Other Ambulatory Visit: Payer: Self-pay | Admitting: Oncology

## 2018-02-16 ENCOUNTER — Telehealth: Payer: Self-pay | Admitting: Nurse Practitioner

## 2018-02-16 ENCOUNTER — Inpatient Hospital Stay: Payer: Medicare Other

## 2018-02-16 ENCOUNTER — Encounter: Payer: Self-pay | Admitting: Nurse Practitioner

## 2018-02-16 ENCOUNTER — Inpatient Hospital Stay: Payer: Medicare Other | Attending: Oncology

## 2018-02-16 ENCOUNTER — Other Ambulatory Visit: Payer: Self-pay | Admitting: *Deleted

## 2018-02-16 ENCOUNTER — Inpatient Hospital Stay (HOSPITAL_BASED_OUTPATIENT_CLINIC_OR_DEPARTMENT_OTHER): Payer: Medicare Other | Admitting: Nurse Practitioner

## 2018-02-16 VITALS — BP 111/65 | HR 84 | Temp 97.8°F | Resp 18 | Ht 76.0 in | Wt 202.5 lb

## 2018-02-16 DIAGNOSIS — R05 Cough: Secondary | ICD-10-CM | POA: Insufficient documentation

## 2018-02-16 DIAGNOSIS — Z79899 Other long term (current) drug therapy: Secondary | ICD-10-CM

## 2018-02-16 DIAGNOSIS — Z8719 Personal history of other diseases of the digestive system: Secondary | ICD-10-CM | POA: Insufficient documentation

## 2018-02-16 DIAGNOSIS — Z86711 Personal history of pulmonary embolism: Secondary | ICD-10-CM | POA: Diagnosis not present

## 2018-02-16 DIAGNOSIS — I10 Essential (primary) hypertension: Secondary | ICD-10-CM | POA: Diagnosis not present

## 2018-02-16 DIAGNOSIS — Z9981 Dependence on supplemental oxygen: Secondary | ICD-10-CM | POA: Diagnosis not present

## 2018-02-16 DIAGNOSIS — Z923 Personal history of irradiation: Secondary | ICD-10-CM | POA: Insufficient documentation

## 2018-02-16 DIAGNOSIS — Z95828 Presence of other vascular implants and grafts: Secondary | ICD-10-CM

## 2018-02-16 DIAGNOSIS — D6481 Anemia due to antineoplastic chemotherapy: Secondary | ICD-10-CM

## 2018-02-16 DIAGNOSIS — T451X5S Adverse effect of antineoplastic and immunosuppressive drugs, sequela: Secondary | ICD-10-CM

## 2018-02-16 DIAGNOSIS — J90 Pleural effusion, not elsewhere classified: Secondary | ICD-10-CM | POA: Diagnosis not present

## 2018-02-16 DIAGNOSIS — Z452 Encounter for adjustment and management of vascular access device: Secondary | ICD-10-CM | POA: Diagnosis not present

## 2018-02-16 DIAGNOSIS — C160 Malignant neoplasm of cardia: Secondary | ICD-10-CM

## 2018-02-16 DIAGNOSIS — Z7901 Long term (current) use of anticoagulants: Secondary | ICD-10-CM | POA: Insufficient documentation

## 2018-02-16 DIAGNOSIS — J449 Chronic obstructive pulmonary disease, unspecified: Secondary | ICD-10-CM | POA: Insufficient documentation

## 2018-02-16 DIAGNOSIS — R1310 Dysphagia, unspecified: Secondary | ICD-10-CM | POA: Diagnosis not present

## 2018-02-16 DIAGNOSIS — D61810 Antineoplastic chemotherapy induced pancytopenia: Secondary | ICD-10-CM | POA: Diagnosis not present

## 2018-02-16 DIAGNOSIS — Z5111 Encounter for antineoplastic chemotherapy: Secondary | ICD-10-CM | POA: Diagnosis not present

## 2018-02-16 DIAGNOSIS — R0609 Other forms of dyspnea: Secondary | ICD-10-CM | POA: Diagnosis not present

## 2018-02-16 DIAGNOSIS — E1165 Type 2 diabetes mellitus with hyperglycemia: Secondary | ICD-10-CM | POA: Insufficient documentation

## 2018-02-16 DIAGNOSIS — F329 Major depressive disorder, single episode, unspecified: Secondary | ICD-10-CM

## 2018-02-16 DIAGNOSIS — I2699 Other pulmonary embolism without acute cor pulmonale: Secondary | ICD-10-CM | POA: Diagnosis not present

## 2018-02-16 DIAGNOSIS — C159 Malignant neoplasm of esophagus, unspecified: Secondary | ICD-10-CM

## 2018-02-16 LAB — CBC WITH DIFFERENTIAL (CANCER CENTER ONLY)
BASOS ABS: 0 10*3/uL (ref 0.0–0.1)
Basophils Relative: 0 %
Eosinophils Absolute: 0.3 10*3/uL (ref 0.0–0.5)
Eosinophils Relative: 10 %
HEMATOCRIT: 35.6 % — AB (ref 38.4–49.9)
HEMOGLOBIN: 12 g/dL — AB (ref 13.0–17.1)
LYMPHS PCT: 36 %
Lymphs Abs: 1 10*3/uL (ref 0.9–3.3)
MCH: 33.6 pg — ABNORMAL HIGH (ref 27.2–33.4)
MCHC: 33.7 g/dL (ref 32.0–36.0)
MCV: 99.7 fL — AB (ref 79.3–98.0)
Monocytes Absolute: 0.3 10*3/uL (ref 0.1–0.9)
Monocytes Relative: 9 %
NEUTROS ABS: 1.3 10*3/uL — AB (ref 1.5–6.5)
NEUTROS PCT: 45 %
PLATELETS: 101 10*3/uL — AB (ref 140–400)
RBC: 3.57 MIL/uL — AB (ref 4.20–5.82)
RDW: 13.8 % (ref 11.0–14.6)
WBC: 2.8 10*3/uL — AB (ref 4.0–10.3)

## 2018-02-16 LAB — CEA (IN HOUSE-CHCC): CEA (CHCC-In House): 22.82 ng/mL — ABNORMAL HIGH (ref 0.00–5.00)

## 2018-02-16 LAB — CMP (CANCER CENTER ONLY)
ALBUMIN: 3.4 g/dL — AB (ref 3.5–5.0)
ALK PHOS: 100 U/L (ref 38–126)
ALT: 13 U/L (ref 0–44)
ANION GAP: 8 (ref 5–15)
AST: 13 U/L — ABNORMAL LOW (ref 15–41)
BILIRUBIN TOTAL: 0.4 mg/dL (ref 0.3–1.2)
BUN: 13 mg/dL (ref 8–23)
CO2: 26 mmol/L (ref 22–32)
Calcium: 9.7 mg/dL (ref 8.9–10.3)
Chloride: 107 mmol/L (ref 98–111)
Creatinine: 0.96 mg/dL (ref 0.61–1.24)
GLUCOSE: 129 mg/dL — AB (ref 70–99)
POTASSIUM: 3.8 mmol/L (ref 3.5–5.1)
Sodium: 141 mmol/L (ref 135–145)
TOTAL PROTEIN: 6.6 g/dL (ref 6.5–8.1)

## 2018-02-16 MED ORDER — ALBUTEROL SULFATE HFA 108 (90 BASE) MCG/ACT IN AERS: 2.0000 | INHALATION_SPRAY | Freq: Four times a day (QID) | RESPIRATORY_TRACT | 2 refills | Status: AC | PRN

## 2018-02-16 MED ORDER — PALONOSETRON HCL INJECTION 0.25 MG/5ML
0.2500 mg | Freq: Once | INTRAVENOUS | Status: AC
  Administered 2018-02-16: 0.25 mg via INTRAVENOUS

## 2018-02-16 MED ORDER — PALONOSETRON HCL INJECTION 0.25 MG/5ML
INTRAVENOUS | Status: AC
  Filled 2018-02-16: qty 5

## 2018-02-16 MED ORDER — DEXAMETHASONE SODIUM PHOSPHATE 10 MG/ML IJ SOLN
10.0000 mg | Freq: Once | INTRAMUSCULAR | Status: AC
  Administered 2018-02-16: 10 mg via INTRAVENOUS

## 2018-02-16 MED ORDER — OXALIPLATIN CHEMO INJECTION 100 MG/20ML
60.0000 mg/m2 | Freq: Once | INTRAVENOUS | Status: AC
  Administered 2018-02-16: 135 mg via INTRAVENOUS
  Filled 2018-02-16: qty 20

## 2018-02-16 MED ORDER — SODIUM CHLORIDE 0.9% FLUSH
10.0000 mL | INTRAVENOUS | Status: DC | PRN
  Administered 2018-02-16: 10 mL
  Filled 2018-02-16: qty 10

## 2018-02-16 MED ORDER — FLUOROURACIL CHEMO INJECTION 2.5 GM/50ML
400.0000 mg/m2 | Freq: Once | INTRAVENOUS | Status: AC
  Administered 2018-02-16: 900 mg via INTRAVENOUS
  Filled 2018-02-16: qty 18

## 2018-02-16 MED ORDER — SODIUM CHLORIDE 0.9 % IV SOLN
2400.0000 mg/m2 | INTRAVENOUS | Status: DC
  Administered 2018-02-16: 5400 mg via INTRAVENOUS
  Filled 2018-02-16: qty 108

## 2018-02-16 MED ORDER — DEXTROSE 5 % IV SOLN
Freq: Once | INTRAVENOUS | Status: AC
  Administered 2018-02-16: 10:00:00 via INTRAVENOUS

## 2018-02-16 MED ORDER — LEUCOVORIN CALCIUM INJECTION 350 MG
400.0000 mg/m2 | Freq: Once | INTRAVENOUS | Status: AC
  Administered 2018-02-16: 896 mg via INTRAVENOUS
  Filled 2018-02-16: qty 44.8

## 2018-02-16 MED ORDER — DEXAMETHASONE SODIUM PHOSPHATE 10 MG/ML IJ SOLN
INTRAMUSCULAR | Status: AC
  Filled 2018-02-16: qty 1

## 2018-02-16 NOTE — Telephone Encounter (Signed)
Scheduled appt per 7/1 los - pt to get an updated schedule in treatment area.  

## 2018-02-16 NOTE — Patient Instructions (Signed)
[facility] Discharge Instructions for Patients Receiving Chemotherapy  Today you received the following chemotherapy agents :  Oxaliplatin,  Leucovorin,  Fluorouracil.  To help prevent nausea and vomiting after your treatment, we encourage you to take your nausea medication as prescribed.   If you develop nausea and vomiting that is not controlled by your nausea medication, call the clinic.   BELOW ARE SYMPTOMS THAT SHOULD BE REPORTED IMMEDIATELY:  *FEVER GREATER THAN 100.5 F  *CHILLS WITH OR WITHOUT FEVER  NAUSEA AND VOMITING THAT IS NOT CONTROLLED WITH YOUR NAUSEA MEDICATION  *UNUSUAL SHORTNESS OF BREATH  *UNUSUAL BRUISING OR BLEEDING  TENDERNESS IN MOUTH AND THROAT WITH OR WITHOUT PRESENCE OF ULCERS  *URINARY PROBLEMS  *BOWEL PROBLEMS  UNUSUAL RASH Items with * indicate a potential emergency and should be followed up as soon as possible.  Feel free to call the clinic should you have any questions or concerns. The clinic phone number is [phone].  Please show the CHEMO ALERT CARD at check-in to the Emergency Department and triage nurse.   

## 2018-02-16 NOTE — Progress Notes (Signed)
[place] OFFICE PROGRESS NOTE   Diagnosis: Esophagus cancer  INTERVAL HISTORY:   Ronald Johnson returns as scheduled.  He completed cycle 2 FOLFOX 02/02/2018.  He had 2 episodes of nausea relieved with his home antiemetic.  No mouth sores.  No diarrhea.  No cold sensitivity.  No numbness or tingling in his hands or feet today.  He reports an intermittently productive cough over the past 3 weeks.  No shortness of breath except when getting in and out of bed.  He was able to walk into the office today with no shortness of breath.  He denies fever.  He continues to have dysphagia with solids.  He is tolerating liquids.  Objective:  Vital signs in last 24 hours:  Blood pressure 111/65, pulse 84, temperature 97.8 F (36.6 C), temperature source Oral, resp. rate 18, height 6\' 4"  (1.93 m), weight 202 lb 8 oz (91.9 kg), SpO2 94 %.    HEENT: No thrush or ulcers. Resp: Distant breath sounds.  Scattered wheezes and rhonchi.  No respiratory distress. Cardio: Regular rate and rhythm. GI: Abdomen soft and nontender.  No hepatomegaly. Vascular: No leg edema.  Skin: Skin in general has a dry appearance. Port-A-Cath without erythema.   Lab Results:  Lab Results  Component Value Date   WBC 2.8 (L) 02/16/2018   HGB 12.0 (L) 02/16/2018   HCT 35.6 (L) 02/16/2018   MCV 99.7 (H) 02/16/2018   PLT 101 (L) 02/16/2018   NEUTROABS 1.3 (L) 02/16/2018    Imaging:  No results found.  Medications: I have reviewed the patient's current medications.  Assessment/Plan: 1. 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 EUS 03/16/2015 confirmed a clinical stage IIb (uT3,uN1) tumor ? Staging CTs of the chest, abdomen, and pelvis with no evidence of metastatic disease ? Initiation of radiation 04/19/2015, cycle 1 Taxol/carboplatin 04/20/2015: Radiation completed 05/29/2015; the fifth and final week of Taxol/carboplatin 05/18/2015 ? Restaging CTs 07/20/2015  revealed no evidence of metastatic disease, incidental left lower lobe pulmonary embolism ? Transhiatal total esophagectomy 07/25/2015 confirmed a ypT3, ypN2 with 4/5 positive lymph nodes, lymphovascular invasion, perineural invasion, negative resection margins ? PD1 score-0 ? CT neck 07/25/2017-4-5 cm mass in the right lower neck upper mediastinum immediately adjacent to the esophageal anastomosis. 12 mm mass within the right parotid enlarged since the previous PET study where it was about half that size. Question few small pulmonary nodules. Borderline enlarged mediastinal lymph nodes. ? CTs brain/chest/abdomen/pelvis 08/06/2017-ill-defined mass at the right thoracic inlet extending into the right superior mediastinum; new bilateral but right much greater than left peripheral reticulonodular and groundglass opacities; scattered small paratracheal and anterior mediastinal lymph nodes; small bilateral axillary lymph nodes. Brain CT with no evidence of metastasis. ? 08/26/2017 biopsy right supraclavicular lymph node-very scant fragment of benign lymph node. ? PET scan 09/11/2017-poorly marginated heterogeneously hypermetabolic soft tissue mass at the right thoracic inlet extending laterally from the esophagogastric anastomosis in the neck. No definite hypermetabolic metastatic disease.Patchy tree-in-bud opacities throughout the lungs significantly decreased. Persistent scattered subcentimeter solid pulmonary nodules in both lungs stable to decreased. Stable irregular 1.7 cm nodular bandlike opacity anterior left lower lobe with associated low-level metabolism. Nonspecific mildly hypermetabolic subcarinal and bilateral hilar lymph nodes. Nonspecific mildly hypermetabolic nodular focus of soft tissue in the upper left retroperitoneum anterior to the adrenal gland. ? Upper endoscopy 09/15/2017-at 20 cm from the incisors tight stricture at the anastomosis measuring only a few millimeters in diameter.  Surrounding tissues firm, slightly  friable, noncompliant. Stricture was dilated with a 15 mm balloon several times. Diameter of stricture improved but still would not permit passage of a standard upper endoscope. Friable areas biopsied-invasive adenocarcinoma. ? Initiation of radiation and concurrent capecitabine 2/4/2019completed 10/30/2017 ? CTs neck and chest 12/23/2017-stable abnormal soft tissue at the right thoracic inlet with encasement of the proximal right common carotid artery, interval development of sclerotic lesions at C7 and T1, new indeterminate right lung opacities ? Cycle 1 FOLFOX 01/08/2018 ? Cycle 2 FOLFOX 02/02/2018 (Oxaliplatin dose reduced due to neutropenia) ? Cycle 3 FOLFOX 02/16/2018, white cell growth factor support added  2. Postprandial subxiphoid pain secondary to #1  3. Diabetes-elevated blood sugar readings at the [facility] and at home  4. COPD  5. Depression-Improved with Wellbutrin  6. Hypertension  7. History of adenomatous colon polyps  8. History of diabetic related neuropathy, peripheral arterial disease, and nephropathy  9. History of nausea-likely related to radiation esophagitis/gastritis  10. Incidental pulmonary embolism noted on the CT 07/20/2015, treated with Xarelto  11. Admission 08/18/2015 with GI bleeding while on Xarelto, anticoagulation therapy discontinued  12. Dysphagia-improved with esophageal dilatation procedures by Dr. Henrene Pastor, progressive dysphagia secondary to local recurrence of esophagus cancer with obstruction at the esophagus anastomosis  13.History ofmild thrombocytopenia  14. CT scan abdomen/pelvis 10/09/2016 (done to evaluate persistent nausea, epigastric pain)-no acute findings or explanation for the patient's symptoms. Resolving postsurgical changes from presumed previous esophagectomy and gastric pull-through. No evidence of metastatic disease.  15.  Anemia secondary to chemotherapy  01/26/2018.   Disposition: Ronald Johnson appears unchanged.  He has completed 2 cycles of FOLFOX.  Plan to proceed with cycle 3 today as scheduled.  We reviewed the CBC from today.  He has mild neutropenia.  We discussed white cell growth factor support and reviewed potential toxicities including bone pain, rash, splenic rupture.  He agrees to receiving Udenyca on the day of pump discontinuation.  He understands to contact the office with fever, chills, other signs of infection.  We also discussed the mild thrombocytopenia.  He understands to contact the office with any bleeding.  He will return for lab, follow-up and cycle 4 FOLFOX in 2 weeks.  He will contact the office in the interim as outlined above or with any other problems.  Patient seen with Dr. [name].  25 minutes were spent face-to-face at today's visit with the majority of that time involved in counseling/coordination of care.    Ned Card ANP/GNP-BC   02/16/2018  9:28 AM This was a shared visit with Ned Card.  Ronald Johnson was interviewed and examined.  He has completed 2 cycles of FOLFOX.  His overall clinical status has not improved.  He will complete cycle 3 today.  He will receive growth factor support with this cycle.  The plan is to schedule a restaging CT evaluation after cycle 4 or cycle 5 FOLFOX.  Julieanne Manson, MD

## 2018-02-16 NOTE — Progress Notes (Signed)
Reviewed pt labs with Lattie Haw NP (CBC) and pt ok to treat with ANC 1.3

## 2018-02-18 ENCOUNTER — Inpatient Hospital Stay: Payer: Medicare Other

## 2018-02-18 VITALS — BP 105/68 | HR 95 | Temp 97.8°F | Resp 20

## 2018-02-18 DIAGNOSIS — D61810 Antineoplastic chemotherapy induced pancytopenia: Secondary | ICD-10-CM | POA: Diagnosis not present

## 2018-02-18 DIAGNOSIS — C159 Malignant neoplasm of esophagus, unspecified: Secondary | ICD-10-CM

## 2018-02-18 DIAGNOSIS — Z5111 Encounter for antineoplastic chemotherapy: Secondary | ICD-10-CM | POA: Diagnosis not present

## 2018-02-18 DIAGNOSIS — Z923 Personal history of irradiation: Secondary | ICD-10-CM | POA: Diagnosis not present

## 2018-02-18 DIAGNOSIS — Z452 Encounter for adjustment and management of vascular access device: Secondary | ICD-10-CM | POA: Diagnosis not present

## 2018-02-18 DIAGNOSIS — C160 Malignant neoplasm of cardia: Secondary | ICD-10-CM | POA: Diagnosis not present

## 2018-02-18 DIAGNOSIS — D6481 Anemia due to antineoplastic chemotherapy: Secondary | ICD-10-CM | POA: Diagnosis not present

## 2018-02-18 MED ORDER — PEGFILGRASTIM-CBQV 6 MG/0.6ML ~~LOC~~ SOSY
6.0000 mg | PREFILLED_SYRINGE | Freq: Once | SUBCUTANEOUS | Status: AC
  Administered 2018-02-18: 6 mg via SUBCUTANEOUS

## 2018-02-18 MED ORDER — HEPARIN SOD (PORK) LOCK FLUSH 100 UNIT/ML IV SOLN
500.0000 [IU] | Freq: Once | INTRAVENOUS | Status: AC | PRN
  Administered 2018-02-18: 500 [IU]
  Filled 2018-02-18: qty 5

## 2018-02-18 MED ORDER — SODIUM CHLORIDE 0.9% FLUSH
10.0000 mL | INTRAVENOUS | Status: DC | PRN
  Administered 2018-02-18: 10 mL
  Filled 2018-02-18: qty 10

## 2018-02-18 NOTE — Patient Instructions (Signed)
Pegfilgrastim injection What is this medicine? PEGFILGRASTIM (PEG fil gra stim) is a long-acting granulocyte colony-stimulating factor that stimulates the growth of neutrophils, a type of white blood cell important in the body's fight against infection. It is used to reduce the incidence of fever and infection in patients with certain types of cancer who are receiving chemotherapy that affects the bone marrow, and to increase survival after being exposed to high doses of radiation. This medicine may be used for other purposes; ask your health care provider or pharmacist if you have questions. COMMON BRAND NAME(S): Neulasta What should I tell my health care provider before I take this medicine? They need to know if you have any of these conditions: -kidney disease -latex allergy -ongoing radiation therapy -sickle cell disease -skin reactions to acrylic adhesives (On-Body Injector only) -an unusual or allergic reaction to pegfilgrastim, filgrastim, other medicines, foods, dyes, or preservatives -pregnant or trying to get pregnant -breast-feeding How should I use this medicine? This medicine is for injection under the skin. If you get this medicine at home, you will be taught how to prepare and give the pre-filled syringe or how to use the On-body Injector. Refer to the patient Instructions for Use for detailed instructions. Use exactly as directed. Tell your healthcare provider immediately if you suspect that the On-body Injector may not have performed as intended or if you suspect the use of the On-body Injector resulted in a missed or partial dose. It is important that you put your used needles and syringes in a special sharps container. Do not put them in a trash can. If you do not have a sharps container, call your pharmacist or healthcare provider to get one. Talk to your pediatrician regarding the use of this medicine in children. While this drug may be prescribed for selected conditions,  precautions do apply. Overdosage: If you think you have taken too much of this medicine contact a poison control center or emergency room at once. NOTE: This medicine is only for you. Do not share this medicine with others. What if I miss a dose? It is important not to miss your dose. Call your doctor or health care professional if you miss your dose. If you miss a dose due to an On-body Injector failure or leakage, a new dose should be administered as soon as possible using a single prefilled syringe for manual use. What may interact with this medicine? Interactions have not been studied. Give your health care provider a list of all the medicines, herbs, non-prescription drugs, or dietary supplements you use. Also tell them if you smoke, drink alcohol, or use illegal drugs. Some items may interact with your medicine. This list may not describe all possible interactions. Give your health care provider a list of all the medicines, herbs, non-prescription drugs, or dietary supplements you use. Also tell them if you smoke, drink alcohol, or use illegal drugs. Some items may interact with your medicine. What should I watch for while using this medicine? You may need blood work done while you are taking this medicine. If you are going to need a MRI, CT scan, or other procedure, tell your doctor that you are using this medicine (On-Body Injector only). What side effects may I notice from receiving this medicine? Side effects that you should report to your doctor or health care professional as soon as possible: -allergic reactions like skin rash, itching or hives, swelling of the face, lips, or tongue -dizziness -fever -pain, redness, or irritation at site   where injected -pinpoint red spots on the skin -red or dark-brown urine -shortness of breath or breathing problems -stomach or side pain, or pain at the shoulder -swelling -tiredness -trouble passing urine or change in the amount of urine Side  effects that usually do not require medical attention (report to your doctor or health care professional if they continue or are bothersome): -bone pain -muscle pain This list may not describe all possible side effects. Call your doctor for medical advice about side effects. You may report side effects to FDA at 1-800-FDA-1088. Where should I keep my medicine? Keep out of the reach of children. Store pre-filled syringes in a refrigerator between 2 and 8 degrees C (36 and 46 degrees F). Do not freeze. Keep in carton to protect from light. Throw away this medicine if it is left out of the refrigerator for more than 48 hours. Throw away any unused medicine after the expiration date. NOTE: This sheet is a summary. It may not cover all possible information. If you have questions about this medicine, talk to your doctor, pharmacist, or health care provider.  2018 Elsevier/Gold Standard (2016-08-01 12:58:03)  

## 2018-02-24 ENCOUNTER — Other Ambulatory Visit: Payer: Self-pay | Admitting: Emergency Medicine

## 2018-02-24 ENCOUNTER — Telehealth: Payer: Self-pay | Admitting: *Deleted

## 2018-02-24 ENCOUNTER — Emergency Department (HOSPITAL_COMMUNITY): Payer: Medicare Other

## 2018-02-24 ENCOUNTER — Other Ambulatory Visit: Payer: Self-pay

## 2018-02-24 ENCOUNTER — Inpatient Hospital Stay (HOSPITAL_COMMUNITY)
Admission: EM | Admit: 2018-02-24 | Discharge: 2018-02-27 | DRG: 871 | Disposition: A | Discharge status: Home / Self Care | Payer: Medicare Other | Attending: Internal Medicine | Admitting: Internal Medicine

## 2018-02-24 ENCOUNTER — Encounter (HOSPITAL_COMMUNITY): Payer: Self-pay | Admitting: Radiology

## 2018-02-24 DIAGNOSIS — K219 Gastro-esophageal reflux disease without esophagitis: Secondary | ICD-10-CM | POA: Diagnosis not present

## 2018-02-24 DIAGNOSIS — I110 Hypertensive heart disease with heart failure: Secondary | ICD-10-CM | POA: Diagnosis present

## 2018-02-24 DIAGNOSIS — R5381 Other malaise: Secondary | ICD-10-CM | POA: Diagnosis present

## 2018-02-24 DIAGNOSIS — E1140 Type 2 diabetes mellitus with diabetic neuropathy, unspecified: Secondary | ICD-10-CM | POA: Diagnosis present

## 2018-02-24 DIAGNOSIS — I10 Essential (primary) hypertension: Secondary | ICD-10-CM | POA: Diagnosis present

## 2018-02-24 DIAGNOSIS — N179 Acute kidney failure, unspecified: Secondary | ICD-10-CM | POA: Diagnosis not present

## 2018-02-24 DIAGNOSIS — J449 Chronic obstructive pulmonary disease, unspecified: Secondary | ICD-10-CM | POA: Diagnosis present

## 2018-02-24 DIAGNOSIS — D61810 Antineoplastic chemotherapy induced pancytopenia: Secondary | ICD-10-CM | POA: Diagnosis present

## 2018-02-24 DIAGNOSIS — Z66 Do not resuscitate: Secondary | ICD-10-CM | POA: Diagnosis present

## 2018-02-24 DIAGNOSIS — R000 Tachycardia, unspecified: Secondary | ICD-10-CM | POA: Diagnosis not present

## 2018-02-24 DIAGNOSIS — E1151 Type 2 diabetes mellitus with diabetic peripheral angiopathy without gangrene: Secondary | ICD-10-CM | POA: Diagnosis present

## 2018-02-24 DIAGNOSIS — C160 Malignant neoplasm of cardia: Secondary | ICD-10-CM

## 2018-02-24 DIAGNOSIS — I2699 Other pulmonary embolism without acute cor pulmonale: Secondary | ICD-10-CM | POA: Diagnosis present

## 2018-02-24 DIAGNOSIS — R739 Hyperglycemia, unspecified: Secondary | ICD-10-CM | POA: Diagnosis not present

## 2018-02-24 DIAGNOSIS — R1310 Dysphagia, unspecified: Secondary | ICD-10-CM | POA: Diagnosis present

## 2018-02-24 DIAGNOSIS — E1165 Type 2 diabetes mellitus with hyperglycemia: Secondary | ICD-10-CM | POA: Diagnosis present

## 2018-02-24 DIAGNOSIS — E785 Hyperlipidemia, unspecified: Secondary | ICD-10-CM | POA: Diagnosis present

## 2018-02-24 DIAGNOSIS — E1121 Type 2 diabetes mellitus with diabetic nephropathy: Secondary | ICD-10-CM | POA: Diagnosis present

## 2018-02-24 DIAGNOSIS — Z9221 Personal history of antineoplastic chemotherapy: Secondary | ICD-10-CM

## 2018-02-24 DIAGNOSIS — Z85028 Personal history of other malignant neoplasm of stomach: Secondary | ICD-10-CM

## 2018-02-24 DIAGNOSIS — G893 Neoplasm related pain (acute) (chronic): Secondary | ICD-10-CM | POA: Diagnosis not present

## 2018-02-24 DIAGNOSIS — J90 Pleural effusion, not elsewhere classified: Secondary | ICD-10-CM | POA: Diagnosis not present

## 2018-02-24 DIAGNOSIS — Z87891 Personal history of nicotine dependence: Secondary | ICD-10-CM

## 2018-02-24 DIAGNOSIS — A419 Sepsis, unspecified organism: Principal | ICD-10-CM | POA: Diagnosis present

## 2018-02-24 DIAGNOSIS — F329 Major depressive disorder, single episode, unspecified: Secondary | ICD-10-CM | POA: Diagnosis present

## 2018-02-24 DIAGNOSIS — R6521 Severe sepsis with septic shock: Secondary | ICD-10-CM | POA: Diagnosis present

## 2018-02-24 DIAGNOSIS — I248 Other forms of acute ischemic heart disease: Secondary | ICD-10-CM | POA: Diagnosis present

## 2018-02-24 DIAGNOSIS — F418 Other specified anxiety disorders: Secondary | ICD-10-CM | POA: Diagnosis present

## 2018-02-24 DIAGNOSIS — T451X5A Adverse effect of antineoplastic and immunosuppressive drugs, initial encounter: Secondary | ICD-10-CM | POA: Diagnosis present

## 2018-02-24 DIAGNOSIS — F419 Anxiety disorder, unspecified: Secondary | ICD-10-CM | POA: Diagnosis present

## 2018-02-24 DIAGNOSIS — Z85850 Personal history of malignant neoplasm of thyroid: Secondary | ICD-10-CM

## 2018-02-24 DIAGNOSIS — Z923 Personal history of irradiation: Secondary | ICD-10-CM

## 2018-02-24 DIAGNOSIS — I5032 Chronic diastolic (congestive) heart failure: Secondary | ICD-10-CM | POA: Diagnosis present

## 2018-02-24 DIAGNOSIS — R001 Bradycardia, unspecified: Secondary | ICD-10-CM | POA: Diagnosis present

## 2018-02-24 DIAGNOSIS — R0602 Shortness of breath: Secondary | ICD-10-CM | POA: Diagnosis not present

## 2018-02-24 DIAGNOSIS — E119 Type 2 diabetes mellitus without complications: Secondary | ICD-10-CM | POA: Diagnosis not present

## 2018-02-24 DIAGNOSIS — K449 Diaphragmatic hernia without obstruction or gangrene: Secondary | ICD-10-CM | POA: Diagnosis present

## 2018-02-24 DIAGNOSIS — Z515 Encounter for palliative care: Secondary | ICD-10-CM

## 2018-02-24 DIAGNOSIS — Z833 Family history of diabetes mellitus: Secondary | ICD-10-CM

## 2018-02-24 DIAGNOSIS — Z7189 Other specified counseling: Secondary | ICD-10-CM | POA: Diagnosis not present

## 2018-02-24 DIAGNOSIS — Z79899 Other long term (current) drug therapy: Secondary | ICD-10-CM

## 2018-02-24 DIAGNOSIS — Z794 Long term (current) use of insulin: Secondary | ICD-10-CM | POA: Diagnosis not present

## 2018-02-24 DIAGNOSIS — Z9049 Acquired absence of other specified parts of digestive tract: Secondary | ICD-10-CM

## 2018-02-24 DIAGNOSIS — R0902 Hypoxemia: Secondary | ICD-10-CM | POA: Diagnosis not present

## 2018-02-24 DIAGNOSIS — R509 Fever, unspecified: Secondary | ICD-10-CM | POA: Diagnosis not present

## 2018-02-24 DIAGNOSIS — C159 Malignant neoplasm of esophagus, unspecified: Secondary | ICD-10-CM | POA: Diagnosis not present

## 2018-02-24 DIAGNOSIS — Z86010 Personal history of colonic polyps: Secondary | ICD-10-CM

## 2018-02-24 DIAGNOSIS — IMO0001 Reserved for inherently not codable concepts without codable children: Secondary | ICD-10-CM

## 2018-02-24 DIAGNOSIS — R748 Abnormal levels of other serum enzymes: Secondary | ICD-10-CM | POA: Diagnosis not present

## 2018-02-24 DIAGNOSIS — D6959 Other secondary thrombocytopenia: Secondary | ICD-10-CM | POA: Diagnosis not present

## 2018-02-24 DIAGNOSIS — D61818 Other pancytopenia: Secondary | ICD-10-CM

## 2018-02-24 DIAGNOSIS — I2782 Chronic pulmonary embolism: Secondary | ICD-10-CM | POA: Diagnosis not present

## 2018-02-24 DIAGNOSIS — E11649 Type 2 diabetes mellitus with hypoglycemia without coma: Secondary | ICD-10-CM

## 2018-02-24 DIAGNOSIS — J910 Malignant pleural effusion: Secondary | ICD-10-CM | POA: Diagnosis present

## 2018-02-24 DIAGNOSIS — C799 Secondary malignant neoplasm of unspecified site: Secondary | ICD-10-CM | POA: Diagnosis not present

## 2018-02-24 LAB — CBC WITH DIFFERENTIAL/PLATELET
Basophils Absolute: 0 10*3/uL (ref 0.0–0.1)
Basophils Relative: 1 %
Eosinophils Absolute: 0 10*3/uL (ref 0.0–0.7)
Eosinophils Relative: 1 %
HCT: 33.2 % — ABNORMAL LOW (ref 39.0–52.0)
Hemoglobin: 11 g/dL — ABNORMAL LOW (ref 13.0–17.0)
Lymphocytes Relative: 17 %
Lymphs Abs: 0.3 10*3/uL — ABNORMAL LOW (ref 0.7–4.0)
MCH: 33 pg (ref 26.0–34.0)
MCHC: 33.1 g/dL (ref 30.0–36.0)
MCV: 99.7 fL (ref 78.0–100.0)
Monocytes Absolute: 0.3 10*3/uL (ref 0.1–1.0)
Monocytes Relative: 21 %
Neutro Abs: 1 10*3/uL — ABNORMAL LOW (ref 1.7–7.7)
Neutrophils Relative %: 60 %
Platelets: 44 10*3/uL — ABNORMAL LOW (ref 150–400)
RBC: 3.33 MIL/uL — ABNORMAL LOW (ref 4.22–5.81)
RDW: 13.8 % (ref 11.5–15.5)
WBC: 1.6 10*3/uL — ABNORMAL LOW (ref 4.0–10.5)

## 2018-02-24 LAB — CBC
HEMATOCRIT: 30 % — AB (ref 39.0–52.0)
HEMOGLOBIN: 10 g/dL — AB (ref 13.0–17.0)
MCH: 33.2 pg (ref 26.0–34.0)
MCHC: 33.3 g/dL (ref 30.0–36.0)
MCV: 99.7 fL (ref 78.0–100.0)
Platelets: 39 10*3/uL — ABNORMAL LOW (ref 150–400)
RBC: 3.01 MIL/uL — AB (ref 4.22–5.81)
RDW: 13.9 % (ref 11.5–15.5)
WBC: 3.2 10*3/uL — AB (ref 4.0–10.5)

## 2018-02-24 LAB — I-STAT CG4 LACTIC ACID, ED
Lactic Acid, Venous: 2.27 mmol/L (ref 0.5–1.9)
Lactic Acid, Venous: 2.06 mmol/L (ref 0.5–1.9)

## 2018-02-24 LAB — CORTISOL: Cortisol, Plasma: 77 ug/dL

## 2018-02-24 LAB — PROTIME-INR
INR: 1.23
INR: 1.44
PROTHROMBIN TIME: 17.4 s — AB (ref 11.4–15.2)
Prothrombin Time: 15.4 seconds — ABNORMAL HIGH (ref 11.4–15.2)

## 2018-02-24 LAB — COMPREHENSIVE METABOLIC PANEL
ALBUMIN: 2.7 g/dL — AB (ref 3.5–5.0)
ALK PHOS: 71 U/L (ref 38–126)
ALT: 19 U/L (ref 0–44)
ALT: 17 U/L (ref 0–44)
ANION GAP: 8 (ref 5–15)
AST: 21 U/L (ref 15–41)
AST: 21 U/L (ref 15–41)
Albumin: 3.3 g/dL — ABNORMAL LOW (ref 3.5–5.0)
Alkaline Phosphatase: 85 U/L (ref 38–126)
Anion gap: 11 (ref 5–15)
BILIRUBIN TOTAL: 0.8 mg/dL (ref 0.3–1.2)
BUN: 21 mg/dL (ref 8–23)
BUN: 18 mg/dL (ref 8–23)
CALCIUM: 7.9 mg/dL — AB (ref 8.9–10.3)
CO2: 26 mmol/L (ref 22–32)
CO2: 24 mmol/L (ref 22–32)
Calcium: 8.9 mg/dL (ref 8.9–10.3)
Chloride: 102 mmol/L (ref 98–111)
Chloride: 104 mmol/L (ref 98–111)
Creatinine, Ser: 1.17 mg/dL (ref 0.61–1.24)
Creatinine, Ser: 0.95 mg/dL (ref 0.61–1.24)
GFR calc Af Amer: >60 mL/min (ref >60)
GFR calc Af Amer: >60 mL/min (ref >60)
GFR calc non Af Amer: >60 mL/min (ref >60)
GFR calc non Af Amer: >60 mL/min (ref >60)
GLUCOSE: 261 mg/dL — AB (ref 70–99)
Glucose, Bld: 288 mg/dL — ABNORMAL HIGH (ref 70–99)
Potassium: 4.1 mmol/L (ref 3.5–5.1)
Potassium: 4.1 mmol/L (ref 3.5–5.1)
Sodium: 139 mmol/L (ref 135–145)
Sodium: 136 mmol/L (ref 135–145)
TOTAL PROTEIN: 5.3 g/dL — AB (ref 6.5–8.1)
Total Bilirubin: 0.8 mg/dL (ref 0.3–1.2)
Total Protein: 6.3 g/dL — ABNORMAL LOW (ref 6.5–8.1)

## 2018-02-24 LAB — I-STAT TROPONIN, ED: TROPONIN I, POC: 0.13 ng/mL — AB (ref 0.00–0.08)

## 2018-02-24 LAB — LACTIC ACID, PLASMA
Lactic Acid, Venous: 1.4 mmol/L (ref 0.5–1.9)
Lactic Acid, Venous: 1.5 mmol/L (ref 0.5–1.9)

## 2018-02-24 LAB — MRSA PCR SCREENING: MRSA by PCR: NEGATIVE

## 2018-02-24 LAB — APTT: APTT: 35 s (ref 24–36)

## 2018-02-24 LAB — TROPONIN I: Troponin I: 1.28 ng/mL (ref <0.03)

## 2018-02-24 LAB — PROCALCITONIN: Procalcitonin: 7.6 ng/mL

## 2018-02-24 MED ORDER — SODIUM CHLORIDE 0.9% FLUSH
10.0000 mL | INTRAVENOUS | Status: DC | PRN
  Administered 2018-02-27 (×2): 10 mL
  Filled 2018-02-24 (×2): qty 40

## 2018-02-24 MED ORDER — IOPAMIDOL (ISOVUE-370) INJECTION 76%
INTRAVENOUS | Status: AC
  Filled 2018-02-24: qty 100

## 2018-02-24 MED ORDER — NOREPINEPHRINE 4 MG/250ML-% IV SOLN
5.0000 ug/min | INTRAVENOUS | Status: DC
  Filled 2018-02-24: qty 250

## 2018-02-24 MED ORDER — ACETAMINOPHEN 500 MG PO TABS
1000.0000 mg | ORAL_TABLET | Freq: Once | ORAL | Status: AC
  Administered 2018-02-24: 1000 mg via ORAL
  Filled 2018-02-24: qty 2

## 2018-02-24 MED ORDER — VANCOMYCIN HCL IN DEXTROSE 1-5 GM/200ML-% IV SOLN: 1000.0000 mg | Freq: Once | INTRAVENOUS | Status: DC

## 2018-02-24 MED ORDER — NOREPINEPHRINE 4 MG/250ML-% IV SOLN
0.0000 ug/min | Freq: Once | INTRAVENOUS | Status: AC
  Administered 2018-02-24: 2 ug/min via INTRAVENOUS
  Filled 2018-02-24: qty 250

## 2018-02-24 MED ORDER — PIPERACILLIN-TAZOBACTAM 3.375 G IVPB
3.3750 g | Freq: Three times a day (TID) | INTRAVENOUS | Status: DC
  Administered 2018-02-24 – 2018-02-27 (×9): 3.375 g via INTRAVENOUS
  Filled 2018-02-24 (×9): qty 50

## 2018-02-24 MED ORDER — CHLORHEXIDINE GLUCONATE CLOTH 2 % EX PADS
6.0000 | MEDICATED_PAD | Freq: Every day | CUTANEOUS | Status: DC
  Administered 2018-02-24 – 2018-02-26 (×2): 6 via TOPICAL

## 2018-02-24 MED ORDER — PIPERACILLIN-TAZOBACTAM 3.375 G IVPB 30 MIN: 3.3750 g | Freq: Once | INTRAVENOUS | Status: DC

## 2018-02-24 MED ORDER — IOPAMIDOL (ISOVUE-370) INJECTION 76%
100.0000 mL | Freq: Once | INTRAVENOUS | Status: AC | PRN
  Administered 2018-02-24: 100 mL via INTRAVENOUS

## 2018-02-24 MED ORDER — SODIUM CHLORIDE 0.9 % IV BOLUS
1000.0000 mL | Freq: Once | INTRAVENOUS | Status: AC
  Administered 2018-02-24: 1000 mL via INTRAVENOUS

## 2018-02-24 MED ORDER — HYDROCORTISONE NA SUCCINATE PF 100 MG IJ SOLR
50.0000 mg | Freq: Four times a day (QID) | INTRAMUSCULAR | Status: DC
  Administered 2018-02-24 – 2018-02-26 (×8): 50 mg via INTRAVENOUS
  Filled 2018-02-24 (×8): qty 2

## 2018-02-24 MED ORDER — LACTATED RINGERS IV BOLUS (SEPSIS)
1000.0000 mL | Freq: Once | INTRAVENOUS | Status: AC
  Administered 2018-02-24: 1000 mL via INTRAVENOUS

## 2018-02-24 MED ORDER — SODIUM CHLORIDE 0.9% FLUSH
10.0000 mL | Freq: Two times a day (BID) | INTRAVENOUS | Status: DC
  Administered 2018-02-25 – 2018-02-26 (×3): 10 mL

## 2018-02-24 MED ORDER — PANTOPRAZOLE SODIUM 40 MG PO PACK
40.0000 mg | PACK | Freq: Every day | ORAL | Status: DC
  Filled 2018-02-24: qty 20

## 2018-02-24 MED ORDER — PIPERACILLIN-TAZOBACTAM 3.375 G IVPB 30 MIN
3.3750 g | Freq: Once | INTRAVENOUS | Status: AC
  Administered 2018-02-24: 3.375 g via INTRAVENOUS
  Filled 2018-02-24: qty 50

## 2018-02-24 MED ORDER — SODIUM CHLORIDE 0.9 % IV SOLN
2000.0000 mg | Freq: Once | INTRAVENOUS | Status: AC
  Administered 2018-02-24: 2000 mg via INTRAVENOUS
  Filled 2018-02-24: qty 2000

## 2018-02-24 MED ORDER — ORAL CARE MOUTH RINSE
15.0000 mL | Freq: Two times a day (BID) | OROMUCOSAL | Status: DC
  Administered 2018-02-24 – 2018-02-27 (×5): 15 mL via OROMUCOSAL

## 2018-02-24 MED ORDER — SODIUM CHLORIDE 0.9 % IV SOLN
INTRAVENOUS | Status: DC
  Administered 2018-02-24: 18:00:00 via INTRAVENOUS

## 2018-02-24 MED ORDER — VANCOMYCIN HCL IN DEXTROSE 1-5 GM/200ML-% IV SOLN
1000.0000 mg | Freq: Two times a day (BID) | INTRAVENOUS | Status: DC
  Administered 2018-02-25: 1000 mg via INTRAVENOUS
  Filled 2018-02-24: qty 200

## 2018-02-24 NOTE — ED Notes (Signed)
ED TO INPATIENT HANDOFF REPORT  Name/Age/Gender Ronald Johnson 72 y.o. male  Code Status    Code Status Orders  (From admission, onward)        Start     Ordered   02/24/18 1553  Limited resuscitation (code)  Continuous    Question Answer Comment  In the event of cardiac or respiratory ARREST: Initiate Code Blue, Call Rapid Response No   In the event of cardiac or respiratory ARREST: Perform CPR No   In the event of cardiac or respiratory ARREST: Perform Intubation/Mechanical Ventilation No   In the event of cardiac or respiratory ARREST: Use NIPPV/BiPAp only if indicated No   In the event of cardiac or respiratory ARREST: Administer ACLS medications if indicated No   In the event of cardiac or respiratory ARREST: Perform Defibrillation or Cardioversion if indicated No   Comments Pressors only and limit to levophed to 10 mcg      02/24/18 1553    Code Status History    Date Active Date Inactive Code Status Order ID Comments User Context   08/18/2015 1658 08/22/2015 1603 Full Code [id number]  Barton Dubois, MD Inpatient   07/25/2015 1534 08/06/2015 1701 Full Code [id number]  John Giovanni, PA-C Inpatient   09/10/2011 1154 09/12/2011 2015 Full Code [id number]  Jari Sportsman, RN Inpatient    Advance Directive Documentation     Most Recent Value  Type of Advance Directive  Healthcare Power of Attorney, Living will  Pre-existing out of facility DNR order (yellow form or pink MOST form)  -  "MOST" Form in Place?  -      Home/SNF/Other Home  Chief Complaint SOB / fever   Level of Care/Admitting Diagnosis ED Disposition    ED Disposition Condition [place]: [place] [100102]  Level of Care: ICU [6]  Diagnosis: Septic shock [facility]) [[id number]]  Admitting Physician: Lady Saucier  Attending Physician: Rush Farmer [id number]  Estimated length of stay: 3 - 4 days  Certification:: I certify this patient will need inpatient  services for at least 2 midnights  PT Class (Do Not Modify): Inpatient [101]  PT Acc Code (Do Not Modify): Private [1]       Medical History Past Medical History:  Diagnosis Date  . Anemia 08/15/2009  . Anxiety   . Arthritis   . [name]'s esophagus 2007  . COLONIC POLYPS, ADENOMATOUS 08/01/2008, 2013, 2014  . COPD (chronic obstructive pulmonary disease) (HCC)    no per pt  . Depression   . Diabetes type 2, controlled ([place]) 03/13/2007  . Diverticulitis   . ED (erectile dysfunction)   . EMPHYSEMA 11/08/2008   no per pt  . Esophageal cancer ([place]) "dx'd ~ 01/2015"  . GERD 03/13/2007  . GIB (gastrointestinal bleeding) 08/18/2015  . Hiatal hernia   . History of blood transfusion 08/18/2015   due to GIB  . HYPERCHOLESTEROLEMIA 02/03/2008  . HYPERTENSION 03/13/2007   pt denies, claims white coat syndrome  . Pneumonia   . Prostatism   . PULMONARY NODULE 11/24/2008  . Stomach cancer ([place]) "dx'd ~ 01/2015"  . TOBACCO ABUSE 11/24/2008    Allergies No Known Allergies  IV Location/Drains/Wounds Patient Lines/Drains/Airways Status   Active Line/Drains/Airways    Name:   Placement date:   Placement time:   Site:   Days:   Implanted Port 01/05/18 Left Chest   01/05/18    1531    Chest  50   Gastrostomy/Enterostomy Jejunostomy 18 Fr. LLQ   07/25/15    1436    LLQ   945   External Urinary Catheter   02/24/18    1214    -   less than 1   Incision (Closed) 07/25/15 Abdomen Other (Comment)   07/25/15    1513     945   Incision (Closed) 08/26/17 Cervical Right   08/26/17    1328     182   Incision (Closed) 01/22/18 N/A Other (Comment)   01/22/18    1314     33          Labs/Imaging Results for orders placed or performed during the hospital encounter of 02/24/18 (from the past 48 hour(s))  Comprehensive metabolic panel     Status: Abnormal   Collection Time: 02/24/18 11:06 AM  Result Value Ref Range   Sodium 139 135 - 145 mmol/L   Potassium 4.1 3.5 - 5.1 mmol/L   Chloride 102 98 - 111  mmol/L    Comment: Please note change in reference range.   CO2 26 22 - 32 mmol/L   Glucose, Bld 288 (H) 70 - 99 mg/dL    Comment: Please note change in reference range.   BUN 21 8 - 23 mg/dL    Comment: Please note change in reference range.   Creatinine, Ser 1.17 0.61 - 1.24 mg/dL   Calcium 8.9 8.9 - 10.3 mg/dL   Total Protein 6.3 (L) 6.5 - 8.1 g/dL   Albumin 3.3 (L) 3.5 - 5.0 g/dL   AST 21 15 - 41 U/L   ALT 19 0 - 44 U/L    Comment: Please note change in reference range.   Alkaline Phosphatase 85 38 - 126 U/L   Total Bilirubin 0.8 0.3 - 1.2 mg/dL   GFR calc non Af Amer >60 >60 mL/min   GFR calc Af Amer >60 >60 mL/min    Comment: (NOTE) The eGFR has been calculated using the CKD EPI equation. This calculation has not been validated in all clinical situations. eGFR's persistently <60 mL/min signify possible Chronic Kidney Disease.    Anion gap 11 5 - 15    Comment: Performed at [facility], [place] [address], [place], [place] [address]  CBC with Differential     Status: Abnormal   Collection Time: 02/24/18 11:06 AM  Result Value Ref Range   WBC 1.6 (L) 4.0 - 10.5 K/uL   RBC 3.33 (L) 4.22 - 5.81 MIL/uL   Hemoglobin 11.0 (L) 13.0 - 17.0 g/dL   HCT 33.2 (L) 39.0 - 52.0 %   MCV 99.7 78.0 - 100.0 fL   MCH 33.0 26.0 - 34.0 pg   MCHC 33.1 30.0 - 36.0 g/dL   RDW 13.8 11.5 - 15.5 %   Platelets 44 (L) 150 - 400 K/uL    Comment: REPEATED TO VERIFY SPECIMEN CHECKED FOR CLOTS PLATELET COUNT CONFIRMED BY SMEAR    Neutrophils Relative % 60 %   Neutro Abs 1.0 (L) 1.7 - 7.7 K/uL   Lymphocytes Relative 17 %   Lymphs Abs 0.3 (L) 0.7 - 4.0 K/uL   Monocytes Relative 21 %   Monocytes Absolute 0.3 0.1 - 1.0 K/uL   Eosinophils Relative 1 %   Eosinophils Absolute 0.0 0.0 - 0.7 K/uL   Basophils Relative 1 %   Basophils Absolute 0.0 0.0 - 0.1 K/uL   WBC Morphology DOHLE BODIES     Comment: TOXIC GRANULATION Performed at  [facility], [place] [address], [place], [place] [address]   Protime-INR     Status: Abnormal   Collection Time: 02/24/18 11:06 AM  Result Value Ref Range   Prothrombin Time 15.4 (H) 11.4 - 15.2 seconds   INR 1.23     Comment: Performed at [facility], [place] [address], [place], [place] [address]  I-stat troponin, ED (not at [facility], [facility])     Status: Abnormal   Collection Time: 02/24/18 11:38 AM  Result Value Ref Range   Troponin i, poc 0.13 (HH) 0.00 - 0.08 ng/mL   Comment NOTIFIED PHYSICIAN    Comment 3            Comment: Due to the release kinetics of cTnI, a negative result within the first hours of the onset of symptoms does not rule out myocardial infarction with certainty. If myocardial infarction is still suspected, repeat the test at appropriate intervals.   I-Stat CG4 Lactic Acid, ED     Status: Abnormal   Collection Time: 02/24/18 11:39 AM  Result Value Ref Range   Lactic Acid, Venous 2.27 (HH) 0.5 - 1.9 mmol/L   Comment NOTIFIED PHYSICIAN   I-Stat CG4 Lactic Acid, ED     Status: Abnormal   Collection Time: 02/24/18  1:21 PM  Result Value Ref Range   Lactic Acid, Venous 2.06 (HH) 0.5 - 1.9 mmol/L   Comment NOTIFIED PHYSICIAN    Ct Angio Chest Pe W/cm &/or Wo Cm  Result Date: 02/24/2018 CLINICAL DATA:  Shortness of breath. History of esophageal and thyroid cancer. EXAM: CT ANGIOGRAPHY CHEST WITH CONTRAST TECHNIQUE: Multidetector CT imaging of the chest was performed using the standard protocol during bolus administration of intravenous contrast. Multiplanar CT image reconstructions and MIPs were obtained to evaluate the vascular anatomy. CONTRAST:  138m ISOVUE-370 IOPAMIDOL (ISOVUE-370) INJECTION 76% COMPARISON:  Radiograph of same day.  CT scan of Dec 23, 2017. FINDINGS: Cardiovascular: Small filling defects seen into lower lobe branches of the left pulmonary artery concerning for small peripheral pulmonary emboli. Atherosclerosis of thoracic aorta is noted without aneurysm or dissection.  Left-sided Port-A-Cath is again noted. Probable coronary artery calcifications are noted. No pericardial effusion is noted. Normal cardiac size. Mediastinum/Nodes: Status post gastric pull-through procedure for treatment of esophageal cancer. Continued presence of abnormal soft tissue density in the right thoracic inlet which is grossly unchanged in size or appearance compared to prior exam. This surrounds the proximal right subclavian and carotid arteries. This mass also extends to the right thyroid lobe. No definite adenopathy is noted. Lungs/Pleura: Interval development of large loculated right pleural effusion is noted. Adjacent subsegmental atelectasis is noted in the right lower lobe. 9 mm nodule is noted in right lower lobe which was present on prior exam. No pneumothorax is noted. Bulla formation is noted in both lung apices. Stable 17 x 8 mm nodular density is noted laterally in left lower lobe. Upper Abdomen: No acute abnormality. Musculoskeletal: Stable sclerotic lesion is seen in T1 vertebral body consistent with metastatic disease. No other significant osseous abnormality is noted. Review of the MIP images confirms the above findings. IMPRESSION: Small filling defects are seen in 2 lower lobe branches of the left pulmonary artery concerning for small peripheral pulmonary emboli. Status post gastric pull-through procedure for treatment of esophageal cancer. Stable abnormal soft tissue density seen in right thoracic inlet concerning for malignancy. Interval development of large loculated right pleural effusion with adjacent subsegmental atelectasis of right lower lobe. Stable  bilateral pulmonary nodules are noted as described above. Stable sclerotic lesion seen in T1 vertebral body most consistent with metastatic disease. Aortic Atherosclerosis (ICD10-I70.0). Electronically Signed   By: Marijo Conception, M.D.   On: 02/24/2018 13:17   Dg Chest Port 1 View  Result Date: 02/24/2018 CLINICAL DATA:   Esophageal malignancy with most recent chemotherapy last week. Patient is febrile with shortness of breath and decreased oxygen saturation. EXAM: PORTABLE CHEST 1 VIEW COMPARISON:  CT scan of the chest of Dec 23, 2017 FINDINGS: The lungs are well-expanded. There is increased interstitial density on the right consistent with posterior layering of a moderate-sized pleural effusion. The pulmonary interstitium is also more conspicuous on the right than on the previous study. The left lung is well-expanded and clear. The heart and pulmonary vascularity are normal. The power port catheter tip projects over the distal third of the SVC. IMPRESSION: Interval further accumulation of pleural fluid on the right which is layering posteriorly. Mildly increased interstitial markings on the right as compared to the left may reflect asymmetric pulmonary edema of cardiac or noncardiac cause. It could also reflect lymphatic obstruction. Repeat chest CT scanning is recommended. Thoracic aortic atherosclerosis. Electronically Signed   By: David  Martinique M.D.   On: 02/24/2018 12:33    Pending Labs Unresulted Labs (From admission, onward)   Start     Ordered   02/24/18 1547  Urine culture  STAT,   STAT     02/24/18 1553   02/24/18 1546  Culture, blood (x 2)  BLOOD CULTURE X 2,   STAT    Comments:  INITIATE ANTIBIOTICS WITHIN 1 HOUR AFTER BLOOD CULTURES DRAWN.  If unable to obtain blood cultures, call MD immediately regarding antibiotic instructions.    02/24/18 1553   02/24/18 1546  CBC  STAT,   STAT     02/24/18 1553   02/24/18 1546  Comprehensive metabolic panel  STAT,   STAT     02/24/18 1553   02/24/18 1546  Cortisol  STAT,   STAT     02/24/18 1553   02/24/18 1546  Troponin I  STAT,   STAT     02/24/18 1553   02/24/18 1546  Protime-INR  STAT,   STAT     02/24/18 1553   02/24/18 1546  Procalcitonin  STAT,   STAT     02/24/18 1553   02/24/18 1546  APTT  STAT,   STAT     02/24/18 1553   02/24/18 1546  Type and  screen [place]  STAT,   STAT    Comments:  [place]    02/24/18 1553   02/24/18 1112  Blood gas, venous ([facility], [facility], [facility])  STAT,   STAT     02/24/18 1112   02/24/18 1107  Urine culture  STAT,   STAT     02/24/18 1107   02/24/18 1107  Lactic acid, plasma  Now then every 2 hours,   STAT     02/24/18 1107   02/24/18 1106  Culture, blood (Routine x 2)  BLOOD CULTURE X 2,   STAT     02/24/18 1107   02/24/18 1106  Urinalysis, Routine w reflex microscopic  STAT,   STAT     02/24/18 1107      Vitals/Pain Today's Vitals   02/24/18 1530 02/24/18 1550 02/24/18 1600 02/24/18 1620  BP: (!) 109/48 (!) 111/54 105/69 124/70  Pulse: (!) 56 (!) 57 (!) 48 (!)  57  Resp: '12 12 14 19  '$ Temp:      TempSrc:      SpO2: 100% 99% 100% 100%  Weight:      Height:      PainSc:        Isolation Precautions No active isolations  Medications Medications  norepinephrine (LEVOPHED) '4mg'$  in D5W 26m premix infusion (has no administration in time range)  hydrocortisone sodium succinate (SOLU-CORTEF) 100 MG injection 50 mg (has no administration in time range)  pantoprazole sodium (PROTONIX) 40 mg/20 mL oral suspension 40 mg (has no administration in time range)  lactated ringers bolus 1,000 mL (0 mLs Intravenous Stopped 02/24/18 1353)    And  lactated ringers bolus 1,000 mL (0 mLs Intravenous Stopped 02/24/18 1454)    And  lactated ringers bolus 1,000 mL (0 mLs Intravenous Stopped 02/24/18 1454)  piperacillin-tazobactam (ZOSYN) IVPB 3.375 g (0 g Intravenous Stopped 02/24/18 1232)  acetaminophen (TYLENOL) tablet 1,000 mg (1,000 mg Oral Given 02/24/18 1137)  vancomycin (VANCOCIN) 2,000 mg in sodium chloride 0.9 % 500 mL IVPB (0 mg Intravenous Stopped 02/24/18 1454)  iopamidol (ISOVUE-370) 76 % injection 100 mL (100 mLs Intravenous Contrast Given 02/24/18 1231)  sodium chloride 0.9 % bolus 1,000 mL (0 mLs Intravenous Stopped 02/24/18 1600)  norepinephrine (LEVOPHED) '4mg'$  in D5W 2513m premix infusion (8 mcg/min Intravenous Rate/Dose Change 02/24/18 1503)    Mobility non-ambulatory

## 2018-02-24 NOTE — ED Notes (Signed)
Dr. Wilson Singer made aware that patient has critical troponin value of 0.13

## 2018-02-24 NOTE — ED Provider Notes (Signed)
[facility] DEPT Provider Note   CSN: [id number] Arrival date & time: 02/24/18  1041     History   Chief Complaint Chief Complaint  Patient presents with  . Shortness of Breath  . Fever    HPI Ronald Johnson is a 72 y.o. male presenting for evaluation of shortness of breath and fever.  Patient states that the past month, he has been having gradually worsening shortness of breath.  It has worsened even more over the past several days.  He has a mildly productive cough, this is also been present for several weeks.  Wife noted that he had a fever approximately 6 hours prior to arrival.  When she checked it at home, temperature was 103, so they brought him to the ER.  Patient denies pain, including chest pain or abdominal pain.  He denies nausea, vomiting, urinary symptoms, abnormal bowel movements.  Denies leg pain or swelling.  He is currently being treated for esophageal cancer, last chemo treatment was last week.  Medical history significant for anemia, COPD, diabetes, GI bleed, esophageal cancer, stomach cancer, hypertension.  Per chart review, he is not on blood thinners.  Patient states he is DNR/DNI. Would like full aggressive medical treatment.   HPI  Past Medical History:  Diagnosis Date  . Anemia 08/15/2009  . Anxiety   . Arthritis   . [name]'s esophagus 2007  . COLONIC POLYPS, ADENOMATOUS 08/01/2008, 2013, 2014  . COPD (chronic obstructive pulmonary disease) (HCC)    no per pt  . Depression   . Diabetes type 2, controlled ([place]) 03/13/2007  . Diverticulitis   . ED (erectile dysfunction)   . EMPHYSEMA 11/08/2008   no per pt  . Esophageal cancer ([place]) "dx'd ~ 01/2015"  . GERD 03/13/2007  . GIB (gastrointestinal bleeding) 08/18/2015  . Hiatal hernia   . History of blood transfusion 08/18/2015   due to GIB  . HYPERCHOLESTEROLEMIA 02/03/2008  . HYPERTENSION 03/13/2007   pt denies, claims white coat syndrome  . Pneumonia   . Prostatism   .  PULMONARY NODULE 11/24/2008  . Stomach cancer ([place]) "dx'd ~ 01/2015"  . TOBACCO ABUSE 11/24/2008    Patient Active Problem List   Diagnosis Date Noted  . Port-A-Cath in place 01/08/2018  . Goals of care, counseling/discussion 12/25/2017  . Secondary malignant neoplasm of cervical lymph node ([place]) 09/30/2017  . Headache 06/30/2017  . Abnormal ECG 06/18/2017  . Tremor 05/05/2017  . Erectile dysfunction 10/28/2016  . Adhesive capsulitis of right shoulder associated with type 2 diabetes mellitus ([place]) 09/02/2016  . Esophageal stricture   . Dysphagia 01/23/2016  . IDDM (insulin dependent diabetes mellitus) ([place]) 01/23/2016  . Controlled type 2 diabetes mellitus with diabetic peripheral angiopathy without gangrene, with long-term current use of insulin ([place]) 10/30/2015  . Fever 09/01/2015  . Diabetes mellitus due to underlying condition, uncontrolled, with circulatory complication ([place])   . Chronic diastolic heart failure ([place])   . Anemia requiring transfusions   . Hx pulmonary embolism   . Elevated troponin   . GI bleed 08/18/2015  . Symptomatic anemia 08/18/2015  . Pulmonary embolism ([place]) 08/18/2015  . HLD (hyperlipidemia) 08/18/2015  . GIB (gastrointestinal bleeding) 08/18/2015  . Gastrointestinal hemorrhage with melena   . Esophageal cancer ([place]) 07/25/2015  . LUQ abdominal pain 07/07/2015  . 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 Chest pain 12/02/2014  . Quit smoking within past year 11/04/2013  . Screening for prostate cancer 11/04/2013  . Encounter for long-term (  current) use of other medications 11/04/2013  . Routine general medical examination at a health care facility 11/08/2012  . Gastroenteritis 09/10/2011  . PROTEINURIA, MILD 08/15/2009  . ANXIETY STATE, UNSPECIFIED 12/15/2008  . PULMONARY NODULE 11/24/2008  . EMPHYSEMA 11/08/2008  . COLONIC POLYPS, ADENOMATOUS 08/01/2008  . UNSPECIFIED INFLAMMATORY AND TOXIC NEUROPATHY 05/19/2008  . CHEST PAIN, PLEURITIC  05/19/2008  . HYPERCHOLESTEROLEMIA 02/03/2008  . Depression with anxiety 03/13/2007  . Essential hypertension 03/13/2007  . GERD 03/13/2007    Past Surgical History:  Procedure Laterality Date  . BALLOON DILATION N/A 03/19/2016   Procedure: BALLOON DILATION;  Surgeon: Irene Shipper, MD;  Location: [facility];  Service: Endoscopy;  Laterality: N/A;  . CIRCUMCISION    . COLONOSCOPY    . COMPLETE ESOPHAGECTOMY N/A 07/25/2015   Procedure: TRANSHIATAL TOTAL ESOPHAGECTOMY COMPLETE PYLOROMYOTOMY;  Surgeon: [name], MD;  Location: [facility];  Service: Thoracic;  Laterality: N/A;  . EGD  11/19/2005  . ESOPHAGOGASTRODUODENOSCOPY N/A 03/19/2016   Procedure: ESOPHAGOGASTRODUODENOSCOPY (EGD);  Surgeon: Irene Shipper, MD;  Location: [facility];  Service: Endoscopy;  Laterality: N/A;  . ESOPHAGOGASTRODUODENOSCOPY (EGD) WITH PROPOFOL N/A 09/15/2017   Procedure: ESOPHAGOGASTRODUODENOSCOPY (EGD) WITH PROPOFOL;  Surgeon: Irene Shipper, MD;  Location: [facility];  Service: Endoscopy;  Laterality: N/A;  . EUS N/A 03/16/2015   Procedure: UPPER ENDOSCOPIC ULTRASOUND (EUS) RADIAL;  Surgeon: Milus Banister, MD;  Location: [facility];  Service: Endoscopy;  Laterality: N/A;  . FOOT FRACTURE SURGERY Right 1980   right foot w/ pins and screws  . IR IMAGING GUIDED PORT INSERTION  01/05/2018  . IR US GUIDE VASC ACCESS LEFT  01/05/2018  . JEJUNOSTOMY N/A 07/25/2015   Procedure: FEEDING JEJUNOSTOMY;  Surgeon: [name], MD;  Location: [facility];  Service: Thoracic;  Laterality: N/A;  . MICROLARYNGOSCOPY W/VOCAL CORD INJECTION N/A 01/22/2018   Procedure: MICROLARYNGOSCOPY WITH VOCAL CORD INJECTION;  Surgeon: Melissa Montane, MD;  Location: [facility];  Service: ENT;  Laterality: N/A;  . POLYPECTOMY    . removal pins and screws foot  1980   right foot  . UPPER GASTROINTESTINAL ENDOSCOPY    . VASECTOMY    . VIDEO BRONCHOSCOPY N/A 07/25/2015   Procedure: VIDEO BRONCHOSCOPY;  Surgeon: [name], MD;  Location: [facility] OR;   Service: Thoracic;  Laterality: N/A;        Home Medications    Prior to Admission medications   Medication Sig Start Date End Date Taking? Authorizing Provider  albuterol (PROVENTIL HFA;VENTOLIN HFA) 108 (90 Base) MCG/ACT inhaler Inhale 2 puffs into the lungs every 6 (six) hours as needed for wheezing or shortness of breath. 02/16/18  Yes Owens Shark, NP  diazepam (VALIUM) 5 MG tablet Take 5 mg by mouth every 6 (six) hours as needed for anxiety.   Yes [provider]  escitalopram (LEXAPRO) 20 MG tablet Take 1 tablet (20 mg total) by mouth daily. 10/14/17  Yes [name], MD  glucagon (GLUCAGON EMERGENCY) 1 MG injection Inject 1 mg into the vein once as needed for up to 1 dose. Patient taking differently: Inject 1 mg into the vein once as needed (for low blood sugars).  11/20/17  Yes [name], MD  insulin NPH-regular Human (HUMULIN 70/30) (70-30) 100 UNIT/ML injection Inject 70 Units into the skin daily with breakfast. Patient taking differently: Inject 70-100 Units into the skin daily with breakfast. Dose per sliding scale 01/14/18  Yes [name], MD  ketotifen (ZADITOR) 0.025 % ophthalmic solution Place  1 drop into both eyes 2 (two) times daily as needed (pain).    Yes [provider]  lidocaine-prilocaine (EMLA) cream Apply to Port-A-Cath site 1 hour prior to use 01/08/18  Yes Owens Shark, NP  oxyCODONE (OXY IR/ROXICODONE) 5 MG immediate release tablet Take 1 tablet (5 mg total) by mouth daily as needed for severe pain. 11/25/17  Yes Ladell Pier, MD  prochlorperazine (COMPAZINE) 5 MG tablet Take 1 tablet (5 mg total) by mouth every 6 (six) hours as needed for nausea or vomiting. 01/08/18  Yes Owens Shark, NP  RABEprazole (ACIPHEX) 20 MG tablet TAKE 1 TABLET(20 MG) BY MOUTH TWICE DAILY 12/24/17  Yes Irene Shipper, MD  sucralfate (CARAFATE) 1 g tablet Take 1 tablet (1 g total) by mouth 4 (four) times daily -  with meals and at bedtime. Crush and mix in 1 oz of  water to take Patient taking differently: Take 1 g by mouth 3 (three) times daily as needed. Crush and mix in 1 oz of water to take 08/21/17  Yes Hayden Pedro, PA-C  topiramate (TOPAMAX) 50 MG tablet 2 in the AM, 1 at night Patient taking differently: Take 50-100 mg by mouth See admin instructions. 2 in the AM, 1 at night 11/20/17  Yes Tat, Eustace Quail, DO  glucose blood (PRECISION XTRA TEST STRIPS) test strip Check blood sugar three times a day dx 250.01 06/17/14   [name], MD  Insulin Syringe-Needle U-100 30G X 3/8" 1 ML MISC Use to inject insulin 3 times daily. 06/06/15   [name], MD  sildenafil (REVATIO) 20 MG tablet 1-5 tabs as needed for ED symptoms Patient not taking: Reported on 02/24/2018 03/03/17   [name], MD  metoprolol succinate (TOPROL-XL) 25 MG 24 hr tablet Take 25 mg by mouth daily.  06/17/11 11/01/11  [provider]    Family History Family History  Problem Relation Age of Onset  . Stroke Mother   . Diabetes Mother   . Diabetes Maternal Grandmother        mother side of the family aunts, MGF  . Breast cancer Paternal Grandmother   . AAA (abdominal aortic aneurysm) Father   . Colon cancer Neg Hx   . Esophageal cancer Neg Hx   . Rectal cancer Neg Hx   . Stomach cancer Neg Hx     Social History Social History   Tobacco Use  . Smoking status: Former Smoker    Packs/day: 1.00    Years: 35.00    Pack years: 35.00    Types: Cigarettes    Last attempt to quit: 10/31/2008    Years since quitting: 9.3  . Smokeless tobacco: Never Used  Substance Use Topics  . Alcohol use: No    Alcohol/week: 0.0 oz  . Drug use: No     Allergies   Patient has no known allergies.   Review of Systems Review of Systems  Constitutional: Positive for fever.  Respiratory: Positive for cough and shortness of breath.   All other systems reviewed and are negative.    Physical Exam Updated Vital Signs BP (!) 82/47   Pulse 68   Temp (!) 103.9 F (39.9  C) (Rectal)   Resp 15   Ht 6\' 4"  (1.93 m)   Wt 91.6 kg (202 lb)   SpO2 100%   BMI 24.59 kg/m   Physical Exam  Constitutional: He is oriented to person, place, and time. He appears well-developed and well-nourished. No distress.  Ill-appearing elderly male  HENT:  Head: Normocephalic and atraumatic.  Mouth/Throat: Mucous membranes are dry.  MM dry  Eyes: Pupils are equal, round, and reactive to light. Conjunctivae and EOM are normal.  Neck: Normal range of motion. Neck supple.  Cardiovascular: Regular rhythm and intact distal pulses.  tachycardic  Pulmonary/Chest: Effort normal. No respiratory distress. He has decreased breath sounds. He has no wheezes.  Increased respiratory effort with mild accessory muscle use.  Decreased breath sounds in all fields.  No respiratory failure  Abdominal: Soft. He exhibits no distension and no mass. There is no tenderness. There is no guarding.  Musculoskeletal: Normal range of motion.  Radial and pedal pulses intact. No leg pain or swelling.   Neurological: He is alert and oriented to person, place, and time.  Skin: Skin is warm and dry. Capillary refill takes less than 2 seconds. There is pallor.  Psychiatric: He has a normal mood and affect.  Nursing note and vitals reviewed.    ED Treatments / Results  Labs (all labs ordered are listed, but only abnormal results are displayed) Labs Reviewed  COMPREHENSIVE METABOLIC PANEL - Abnormal; Notable for the following components:      Result Value   Glucose, Bld 288 (*)    Total Protein 6.3 (*)    Albumin 3.3 (*)    All other components within normal limits  CBC WITH DIFFERENTIAL/PLATELET - Abnormal; Notable for the following components:   WBC 1.6 (*)    RBC 3.33 (*)    Hemoglobin 11.0 (*)    HCT 33.2 (*)    Platelets 44 (*)    Neutro Abs 1.0 (*)    Lymphs Abs 0.3 (*)    All other components within normal limits  PROTIME-INR - Abnormal; Notable for the following components:   Prothrombin  Time 15.4 (*)    All other components within normal limits  I-STAT CG4 LACTIC ACID, ED - Abnormal; Notable for the following components:   Lactic Acid, Venous 2.27 (*)    All other components within normal limits  I-STAT TROPONIN, ED - Abnormal; Notable for the following components:   Troponin i, poc 0.13 (*)    All other components within normal limits  I-STAT CG4 LACTIC ACID, ED - Abnormal; Notable for the following components:   Lactic Acid, Venous 2.06 (*)    All other components within normal limits  CULTURE, BLOOD (ROUTINE X 2)  CULTURE, BLOOD (ROUTINE X 2)  URINE CULTURE  URINALYSIS, ROUTINE W REFLEX MICROSCOPIC  LACTIC ACID, PLASMA  LACTIC ACID, PLASMA  BLOOD GAS, VENOUS    EKG None  Radiology Ct Angio Chest Pe W/cm &/or Wo Cm  Result Date: 02/24/2018 CLINICAL DATA:  Shortness of breath. History of esophageal and thyroid cancer. EXAM: CT ANGIOGRAPHY CHEST WITH CONTRAST TECHNIQUE: Multidetector CT imaging of the chest was performed using the standard protocol during bolus administration of intravenous contrast. Multiplanar CT image reconstructions and MIPs were obtained to evaluate the vascular anatomy. CONTRAST:  129mL ISOVUE-370 IOPAMIDOL (ISOVUE-370) INJECTION 76% COMPARISON:  Radiograph of same day.  CT scan of Dec 23, 2017. FINDINGS: Cardiovascular: Small filling defects seen into lower lobe branches of the left pulmonary artery concerning for small peripheral pulmonary emboli. Atherosclerosis of thoracic aorta is noted without aneurysm or dissection. Left-sided Port-A-Cath is again noted. Probable coronary artery calcifications are noted. No pericardial effusion is noted. Normal cardiac size. Mediastinum/Nodes: Status post gastric pull-through procedure for treatment of esophageal cancer. Continued presence of abnormal soft tissue density  in the right thoracic inlet which is grossly unchanged in size or appearance compared to prior exam. This surrounds the proximal right  subclavian and carotid arteries. This mass also extends to the right thyroid lobe. No definite adenopathy is noted. Lungs/Pleura: Interval development of large loculated right pleural effusion is noted. Adjacent subsegmental atelectasis is noted in the right lower lobe. 9 mm nodule is noted in right lower lobe which was present on prior exam. No pneumothorax is noted. Bulla formation is noted in both lung apices. Stable 17 x 8 mm nodular density is noted laterally in left lower lobe. Upper Abdomen: No acute abnormality. Musculoskeletal: Stable sclerotic lesion is seen in T1 vertebral body consistent with metastatic disease. No other significant osseous abnormality is noted. Review of the MIP images confirms the above findings. IMPRESSION: Small filling defects are seen in 2 lower lobe branches of the left pulmonary artery concerning for small peripheral pulmonary emboli. Status post gastric pull-through procedure for treatment of esophageal cancer. Stable abnormal soft tissue density seen in right thoracic inlet concerning for malignancy. Interval development of large loculated right pleural effusion with adjacent subsegmental atelectasis of right lower lobe. Stable bilateral pulmonary nodules are noted as described above. Stable sclerotic lesion seen in T1 vertebral body most consistent with metastatic disease. Aortic Atherosclerosis (ICD10-I70.0). Electronically Signed   By: Marijo Conception, M.D.   On: 02/24/2018 13:17   Dg Chest Port 1 View  Result Date: 02/24/2018 CLINICAL DATA:  Esophageal malignancy with most recent chemotherapy last week. Patient is febrile with shortness of breath and decreased oxygen saturation. EXAM: PORTABLE CHEST 1 VIEW COMPARISON:  CT scan of the chest of Dec 23, 2017 FINDINGS: The lungs are well-expanded. There is increased interstitial density on the right consistent with posterior layering of a moderate-sized pleural effusion. The pulmonary interstitium is also more conspicuous on  the right than on the previous study. The left lung is well-expanded and clear. The heart and pulmonary vascularity are normal. The power port catheter tip projects over the distal third of the SVC. IMPRESSION: Interval further accumulation of pleural fluid on the right which is layering posteriorly. Mildly increased interstitial markings on the right as compared to the left may reflect asymmetric pulmonary edema of cardiac or noncardiac cause. It could also reflect lymphatic obstruction. Repeat chest CT scanning is recommended. Thoracic aortic atherosclerosis. Electronically Signed   By: David  Martinique M.D.   On: 02/24/2018 12:33    Procedures .Critical Care Performed by: Franchot Heidelberg, PA-C Authorized by: Franchot Heidelberg, PA-C   Critical care provider statement:    Critical care time (minutes):  40   Critical care time was exclusive of:  Separately billable procedures and treating other patients and teaching time   Critical care was necessary to treat or prevent imminent or life-threatening deterioration of the following conditions:  Sepsis   Critical care was time spent personally by me on the following activities:  Blood draw for specimens, development of treatment plan with patient or surrogate, discussions with consultants, evaluation of patient's response to treatment, examination of patient, obtaining history from patient or surrogate, ordering and performing treatments and interventions, ordering and review of laboratory studies, ordering and review of radiographic studies, pulse oximetry, re-evaluation of patient's condition and review of old charts   I assumed direction of critical care for this patient from another provider in my specialty: no   Comments:     Patient presenting critically ill, tachycardic, hypotensive, and with accessory muscle use.  Pt  in septic shock, antibiotics, fluids started immediately.  Patient placed on pressors.   (including critical care  time)  Medications Ordered in ED Medications  vancomycin (VANCOCIN) 2,000 mg in sodium chloride 0.9 % 500 mL IVPB (2,000 mg Intravenous New Bag/Given 02/24/18 1245)  iopamidol (ISOVUE-370) 76 % injection (has no administration in time range)  sodium chloride 0.9 % bolus 1,000 mL (has no administration in time range)  lactated ringers bolus 1,000 mL (0 mLs Intravenous Stopped 02/24/18 1353)    And  lactated ringers bolus 1,000 mL (1,000 mLs Intravenous New Bag/Given 02/24/18 1246)    And  lactated ringers bolus 1,000 mL (1,000 mLs Intravenous New Bag/Given 02/24/18 1327)  piperacillin-tazobactam (ZOSYN) IVPB 3.375 g (0 g Intravenous Stopped 02/24/18 1232)  acetaminophen (TYLENOL) tablet 1,000 mg (1,000 mg Oral Given 02/24/18 1137)  iopamidol (ISOVUE-370) 76 % injection 100 mL (100 mLs Intravenous Contrast Given 02/24/18 1231)  norepinephrine (LEVOPHED) 4mg  in D5W 274mL premix infusion (3 mcg/min Intravenous Rate/Dose Change 02/24/18 1408)     Initial Impression / Assessment and Plan / ED Course  I have reviewed the triage vital signs and the nursing notes.  Pertinent labs & imaging results that were available during my care of the patient were reviewed by me and considered in my medical decision making (see chart for details).     Patient presenting for evaluation of shortness of breath and fever.  Physical exam concerning, patient appears ill.  He is hypotensive, tachycardic, and with increased work of breathing.  Overall diminished breath sounds, considering cough and shortness of breath, this is likely the source of sepsis.  Code sepsis called, IV fluids and antibiotics started.  As patient has active cancer, I am also concerned about PE.  Will order CTA, chest x-ray, urine, and labs. Case discussed with attending, Dr. Wilson Singer evaluated the pt.  Labs show leukopenia at 1.6, low platelets at 44.  Lactic elevated.  Chest x-ray shows pleural effusion on the right side, CTA shows small peripheral pulmonary  emboli with loculated right pleural effusion.  Troponin elevated, this is likely due to demand ischemia.  Patient continues to state he has no chest pain, EKG without ST elevation, doubt ACS.  Sepsis reevaluation performed, HR improved, BP still low, lactic mildly improved.  After 3 L, patient still hypotensive. Pressors started. Pt to be admitted to critical care.   Discussed with critical care. Pt admitted.   Final Clinical Impressions(s) / ED Diagnoses   Final diagnoses:  Septic shock [facility])  Pleural effusion    ED Discharge Orders    None       Franchot Heidelberg, PA-C 02/24/18 1410    Virgel Manifold, MD 03/01/18 1132

## 2018-02-24 NOTE — Telephone Encounter (Signed)
Received call from wife stating pt has temp of 103.1 .  Wife wanted a call back from nurse.  Spoke with [facility] and was informed that pt experienced very cold early this am.  Wife called EMS - pt got checked out, and was told that vital signs were good.  Now pt developed temp of 103.1 about 20 min before [facility] called office.  Denied sweating; however, pt has problems with breathing.  Instructed wife to call EMS or take pt to ER NOW for further evaluation.  Ronald Johnson voiced understanding. Noted pt had chemo on  02/16/18.   Message routed to Dr. [name] and desk nurse. Ronald Johnson's    Phone     [phone].

## 2018-02-24 NOTE — ED Notes (Signed)
Dr. Wilson Singer made aware of critical lactic value of 2.27

## 2018-02-24 NOTE — Progress Notes (Signed)
CRITICAL VALUE ALERT  Critical Value:  Troponin   Date & Time Notied: 02/24/18, 2010  Provider Notified: Elink   Orders Received/Actions taken: continue to monitor.

## 2018-02-24 NOTE — Progress Notes (Signed)
A consult was received from an ED physician for vancomycin and zosyn per pharmacy dosing.  The patient's profile has been reviewed for ht/wt/allergies/indication/available labs.   A one time order has been placed for vancomycin 2 gm and zosyn 3.375 gm IV x 1 dose.    Further antibiotics/pharmacy consults should be ordered by admitting physician if indicated.                       Thank you, Eudelia Bunch, Pharm.D. [phone] 02/24/2018 11:19 AM

## 2018-02-24 NOTE — H&P (Addendum)
PULMONARY / CRITICAL CARE MEDICINE   Name: Ronald Johnson MRN: [id number] DOB: May 27, 1946    ADMISSION DATE:  02/24/2018 CONSULTATION DATE:  02/24/18  REFERRING MD:  Dr. Wilson Singer / [facility]   CHIEF COMPLAINT:  Fever, SOB   HISTORY OF PRESENT ILLNESS:   72 y/o M with PMH of esophageal cancer s/p esophageal resection who presented to [facility] ER on 7/9 with reports of fever and shortness of breath.    The patient's wife provides information as patient is unable.  She states he has been receiving chemotherapy and has been feeling weak, sleeping more and having shortness of breath.  in addition, he has had a productive cough.  The patient woke am of presentation and his wife reported he was weak.  She called EMS and they "checked him out" and his vitals were okay.  Approximately one hour later she noted he was shaking and she checked his temperature which was up to 103.  He was transported to the ER and found to be hypotensive.  He received 4L of LR, empiric antibiotics.  Initial labs- Na 139, K 4.1, Sr 1.17, troponin 0.13, troponin 2.27, WBC 1.6, Hgb 11 and platelets 44.  He was started on levophed for hypotension.   PCCM called for ICU admission.  PAST MEDICAL HISTORY :  He  has a past medical history of Anemia (08/15/2009), Anxiety, Arthritis, [name]'s esophagus (2007), COLONIC POLYPS, ADENOMATOUS (08/01/2008, 2013, 2014), COPD (chronic obstructive pulmonary disease) ([place]), Depression, Diabetes type 2, controlled ([place]) (03/13/2007), Diverticulitis, ED (erectile dysfunction), EMPHYSEMA (11/08/2008), Esophageal cancer ([place]) ("dx'd ~ 01/2015"), GERD (03/13/2007), GIB (gastrointestinal bleeding) (08/18/2015), Hiatal hernia, History of blood transfusion (08/18/2015), HYPERCHOLESTEROLEMIA (02/03/2008), HYPERTENSION (03/13/2007), Pneumonia, Prostatism, PULMONARY NODULE (11/24/2008), Stomach cancer ([place]) ("dx'd ~ 01/2015"), and TOBACCO ABUSE (11/24/2008).  PAST SURGICAL HISTORY: He  has a past surgical history that includes EGD  (11/19/2005); Foot fracture surgery (Right, 1980); removal pins and screws foot (1980); Upper gastrointestinal endoscopy; Circumcision; Colonoscopy; Polypectomy; Vasectomy; EUS (N/A, 03/16/2015); Complete esophagectomy (N/A, 07/25/2015); Jejunostomy (N/A, 07/25/2015); Video bronchoscopy (N/A, 07/25/2015); Esophagogastroduodenoscopy (N/A, 03/19/2016); Balloon dilation (N/A, 03/19/2016); Esophagogastroduodenoscopy (egd) with propofol (N/A, 09/15/2017); IR US Guide Vasc Access Left (01/05/2018); IR IMAGING GUIDED PORT INSERTION (01/05/2018); and Microlaryngoscopy w/vocal cord injection (N/A, 01/22/2018).  No Known Allergies  Current Facility-Administered Medications on File Prior to Encounter  Medication  . 0.9 %  sodium chloride infusion   Current Outpatient Medications on File Prior to Encounter  Medication Sig  . albuterol (PROVENTIL HFA;VENTOLIN HFA) 108 (90 Base) MCG/ACT inhaler Inhale 2 puffs into the lungs every 6 (six) hours as needed for wheezing or shortness of breath.  . diazepam (VALIUM) 5 MG tablet Take 5 mg by mouth every 6 (six) hours as needed for anxiety.  Marland Kitchen escitalopram (LEXAPRO) 20 MG tablet Take 1 tablet (20 mg total) by mouth daily.  Marland Kitchen glucagon (GLUCAGON EMERGENCY) 1 MG injection Inject 1 mg into the vein once as needed for up to 1 dose. (Patient taking differently: Inject 1 mg into the vein once as needed (for low blood sugars). )  . insulin NPH-regular Human (HUMULIN 70/30) (70-30) 100 UNIT/ML injection Inject 70 Units into the skin daily with breakfast. (Patient taking differently: Inject 70-100 Units into the skin daily with breakfast. Dose per sliding scale)  . ketotifen (ZADITOR) 0.025 % ophthalmic solution Place 1 drop into both eyes 2 (two) times daily as needed (pain).   Marland Kitchen lidocaine-prilocaine (EMLA) cream Apply to Port-A-Cath site 1 hour prior to use  . oxyCODONE (OXY  IR/ROXICODONE) 5 MG immediate release tablet Take 1 tablet (5 mg total) by mouth daily as needed for severe pain.  Marland Kitchen  prochlorperazine (COMPAZINE) 5 MG tablet Take 1 tablet (5 mg total) by mouth every 6 (six) hours as needed for nausea or vomiting.  . RABEprazole (ACIPHEX) 20 MG tablet TAKE 1 TABLET(20 MG) BY MOUTH TWICE DAILY  . sucralfate (CARAFATE) 1 g tablet Take 1 tablet (1 g total) by mouth 4 (four) times daily -  with meals and at bedtime. Crush and mix in 1 oz of water to take (Patient taking differently: Take 1 g by mouth 3 (three) times daily as needed. Crush and mix in 1 oz of water to take)  . topiramate (TOPAMAX) 50 MG tablet 2 in the AM, 1 at night (Patient taking differently: Take 50-100 mg by mouth See admin instructions. 2 in the AM, 1 at night)  . glucose blood (PRECISION XTRA TEST STRIPS) test strip Check blood sugar three times a day dx 250.01  . Insulin Syringe-Needle U-100 30G X 3/8" 1 ML MISC Use to inject insulin 3 times daily.  . sildenafil (REVATIO) 20 MG tablet 1-5 tabs as needed for ED symptoms (Patient not taking: Reported on 02/24/2018)  . [DISCONTINUED] metoprolol succinate (TOPROL-XL) 25 MG 24 hr tablet Take 25 mg by mouth daily.     FAMILY HISTORY:  His family history includes AAA (abdominal aortic aneurysm) in his father; Breast cancer in his paternal grandmother; Diabetes in his maternal grandmother and mother; Stroke in his mother. There is no history of Colon cancer, Esophageal cancer, Rectal cancer, or Stomach cancer.  SOCIAL HISTORY: He  reports that he quit smoking about 9 years ago. His smoking use included cigarettes. He has a 35.00 pack-year smoking history. He has never used smokeless tobacco. He reports that he does not drink alcohol or use drugs.  REVIEW OF SYSTEMS:  As above, pt unable to provide.   SUBJECTIVE:   VITAL SIGNS: BP (!) 107/50   Pulse (!) 56   Temp (!) 103.9 F (39.9 C) (Rectal)   Resp 18   Ht 6\' 4"  (1.93 m)   Wt 202 lb (91.6 kg)   SpO2 100%   BMI 24.59 kg/m   HEMODYNAMICS:    VENTILATOR SETTINGS:    INTAKE / OUTPUT: No intake/output  data recorded.  PHYSICAL EXAMINATION: General: adult male in NAD lying on ER stretcher  Neuro:  AAOx4, no speech but communicates via nodding HEENT:  MM pink/moist, no jvd  Cardiovascular:  s1s2 rrr, no m/r/g, brady, left chest port Lungs:  Even/non-labored, diminished on right, clear on left Abdomen: soft, non-tender, bsx4 active  Musculoskeletal:  No acute deformities  Skin:  Warm/dry, no edema   LABS:  BMET Recent Labs  Lab 02/24/18 1106  NA 139  K 4.1  CL 102  CO2 26  BUN 21  CREATININE 1.17  GLUCOSE 288*    Electrolytes Recent Labs  Lab 02/24/18 1106  CALCIUM 8.9    CBC Recent Labs  Lab 02/24/18 1106  WBC 1.6*  HGB 11.0*  HCT 33.2*  PLT 44*    Coag's Recent Labs  Lab 02/24/18 1106  INR 1.23    Sepsis Markers Recent Labs  Lab 02/24/18 1139 02/24/18 1321  LATICACIDVEN 2.27* 2.06*    ABG No results for input(s): PHART, PCO2ART, PO2ART in the last 168 hours.  Liver Enzymes Recent Labs  Lab 02/24/18 1106  AST 21  ALT 19  ALKPHOS 85  BILITOT 0.8  ALBUMIN 3.3*    Cardiac Enzymes No results for input(s): TROPONINI, PROBNP in the last 168 hours.  Glucose No results for input(s): GLUCAP in the last 168 hours.  Imaging Ct Angio Chest Pe W/cm &/or Wo Cm  Result Date: 02/24/2018 CLINICAL DATA:  Shortness of breath. History of esophageal and thyroid cancer. EXAM: CT ANGIOGRAPHY CHEST WITH CONTRAST TECHNIQUE: Multidetector CT imaging of the chest was performed using the standard protocol during bolus administration of intravenous contrast. Multiplanar CT image reconstructions and MIPs were obtained to evaluate the vascular anatomy. CONTRAST:  16mL ISOVUE-370 IOPAMIDOL (ISOVUE-370) INJECTION 76% COMPARISON:  Radiograph of same day.  CT scan of Dec 23, 2017. FINDINGS: Cardiovascular: Small filling defects seen into lower lobe branches of the left pulmonary artery concerning for small peripheral pulmonary emboli. Atherosclerosis of thoracic aorta is  noted without aneurysm or dissection. Left-sided Port-A-Cath is again noted. Probable coronary artery calcifications are noted. No pericardial effusion is noted. Normal cardiac size. Mediastinum/Nodes: Status post gastric pull-through procedure for treatment of esophageal cancer. Continued presence of abnormal soft tissue density in the right thoracic inlet which is grossly unchanged in size or appearance compared to prior exam. This surrounds the proximal right subclavian and carotid arteries. This mass also extends to the right thyroid lobe. No definite adenopathy is noted. Lungs/Pleura: Interval development of large loculated right pleural effusion is noted. Adjacent subsegmental atelectasis is noted in the right lower lobe. 9 mm nodule is noted in right lower lobe which was present on prior exam. No pneumothorax is noted. Bulla formation is noted in both lung apices. Stable 17 x 8 mm nodular density is noted laterally in left lower lobe. Upper Abdomen: No acute abnormality. Musculoskeletal: Stable sclerotic lesion is seen in T1 vertebral body consistent with metastatic disease. No other significant osseous abnormality is noted. Review of the MIP images confirms the above findings. IMPRESSION: Small filling defects are seen in 2 lower lobe branches of the left pulmonary artery concerning for small peripheral pulmonary emboli. Status post gastric pull-through procedure for treatment of esophageal cancer. Stable abnormal soft tissue density seen in right thoracic inlet concerning for malignancy. Interval development of large loculated right pleural effusion with adjacent subsegmental atelectasis of right lower lobe. Stable bilateral pulmonary nodules are noted as described above. Stable sclerotic lesion seen in T1 vertebral body most consistent with metastatic disease. Aortic Atherosclerosis (ICD10-I70.0). Electronically Signed   By: Marijo Conception, M.D.   On: 02/24/2018 13:17   Dg Chest Port 1 View  Result  Date: 02/24/2018 CLINICAL DATA:  Esophageal malignancy with most recent chemotherapy last week. Patient is febrile with shortness of breath and decreased oxygen saturation. EXAM: PORTABLE CHEST 1 VIEW COMPARISON:  CT scan of the chest of Dec 23, 2017 FINDINGS: The lungs are well-expanded. There is increased interstitial density on the right consistent with posterior layering of a moderate-sized pleural effusion. The pulmonary interstitium is also more conspicuous on the right than on the previous study. The left lung is well-expanded and clear. The heart and pulmonary vascularity are normal. The power port catheter tip projects over the distal third of the SVC. IMPRESSION: Interval further accumulation of pleural fluid on the right which is layering posteriorly. Mildly increased interstitial markings on the right as compared to the left may reflect asymmetric pulmonary edema of cardiac or noncardiac cause. It could also reflect lymphatic obstruction. Repeat chest CT scanning is recommended. Thoracic aortic atherosclerosis. Electronically Signed   By: David  Martinique M.D.   On: 02/24/2018  12:33     STUDIES:  CTA Chest 7/9 >> small filling defects in 2 lower lobe branches of the left pulmonary artery concerning for small peripheral pulmonary emboli, status post gastric pull through procedure for esophageal cancer, stable abnormal soft tissue density seen in right thoracic inlet concerning for malignancy.  Interval development of large loculated right pleural effusion, stable bilateral pulmonary nodules, stable sclerotic lesion seen in T1 vertebral body most consistent with metastatic disease.   CULTURES: BCx2 7/9 >>  UC 7/9 >>   ANTIBIOTICS: Vanco 7/9 >>  Zosyn 7/9 >>   SIGNIFICANT EVENTS: 7/09  Admit with fever, concern for sepsis  LINES/TUBES: L [facility] 7/9 >>   DISCUSSION: 72 y/o M with esophageal cancer admitted with fever to 103 and concern for septic shock. Suspected pulmonary source.   DNR/DNI.  If declines, would want comfort measures.  ASSESSMENT / PLAN:  PULMONARY A: Large Right Pleural Effusion - loculated, concern for infection.   Hx Tobacco Abuse (quit 2010) P:   Follow intermittent CXR O2 as needed to support sats > 90%  CARDIOVASCULAR A:  Septic Shock - s/p 4L LR in ER Bradycardia  Hx HTN, HLD P:  ICU admit  Levophed for MAP >65.  Levophed capped at 10 mcg Port care per protocol   RENAL A:   At Risk AKI  P:   Trend BMP / urinary output Replace electrolytes as indicated Avoid nephrotoxic agents, ensure adequate renal perfusion  GASTROINTESTINAL A:   Esophageal Cancer s/p Resection - on chemotherapy  P:   Diet as tolerated   HEMATOLOGIC A:   Pancytopenia - in setting of chemotherapy, sepsis  P:  Trend CBC  Monitor for bleeding  Transfuse per ICU guidelines   INFECTIOUS A:   Right Loculated Pleural Effusion  P:   ABX as above  Follow cultures   ENDOCRINE A:   Hyperglycemia   P:   Monitor glucose on BMP  If remains consistently >180, add SSI   NEUROLOGIC A:   Pain P:   RASS goal: n/a   FAMILY  - Updates: Family and patient updated on plan of care at bedside per Dr. Nelda Marseille.  He indicates he would not want CPR/intubation. OK with medical care otherwise.  Will cap levophed at 10 mcg.  If he declines, consider comfort measures after talking with family.   - Inter-disciplinary family meet or Palliative Care meeting due by:  7/16  Noe Gens, NP-C [facility] Pgr: [phone] or if no answer [phone] 02/24/2018, 3:59 PM  Attending Note:  72 year old male with advanced esophageal cancer s/p chemo/radiation and esophagectomy with recurrence who was being treated with chemo up til last week.  Patient was brought to the ED for cough, SOB and fever.  In the ED, the patient was noted to be hypotensive and was given IVF and sepsis protocol started.  Levophed was started when there was no response to IVF and PCCM  was consulted for admission.  On exam, patient is chronically ill appearing with decreased BS on the right.  I reviewed chest CT myself, pleural effusion noted.  Discussed with PCCM-NP.  Spoke with patient.  He is not interested in any form of aggressive care.  He is ok with low dose pressors but that is it.  He is not interested in thora or CT placement.  PCCM will admit.  If worsens then will focus more on comfort.  The patient is critically ill with multiple organ systems  failure and requires high complexity decision making for assessment and support, frequent evaluation and titration of therapies, application of advanced monitoring technologies and extensive interpretation of multiple databases.   Critical Care Time devoted to patient care services described in this note is  45  Minutes. This time reflects time of care of this signee Dr Jennet Maduro. This critical care time does not reflect procedure time, or teaching time or supervisory time of PA/NP/Med student/Med Resident etc but could involve care discussion time.  Rush Farmer, M.D. [facility] Pulmonary/Critical Care Medicine. Pager: [phone]. After hours pager: [phone].

## 2018-02-24 NOTE — ED Notes (Signed)
Bed: WA04 Expected date:  Expected time:  Means of arrival:  Comments: EMS-SOB/cancer

## 2018-02-24 NOTE — ED Triage Notes (Signed)
Per EMS:  Pt c/o of SOB x6 hours.  Pt has been febrile for 6 hours.  Pt hx of esophageal caner.  Last chemo tx last week.  Pt's O2 sat was 86% RA.

## 2018-02-24 NOTE — ED Notes (Signed)
Dr. Wilson Singer made aware of critical lactic value of 2.06

## 2018-02-24 NOTE — Progress Notes (Signed)
Pharmacy Antibiotic Note  Ronald Johnson is a 72 y.o. male with esophageal cancer admitted on 02/24/2018 with sepsis.  Pharmacy has been consulted for vancomycin and zosyn dosing.  Plan:  Zosyn 3.375g IV x 1 given in ED, continue with 3.375gm IV q8h (4hr extended infusions)  Vancomycin 2g IV x 1 given in ED, continue with 1g IV q12h for estimated AUC 516  Check vancomycin levels at steady state, goal AUC 400-500  Follow up renal function (daily SCr) & cultures  Height: 6\' 4"  (193 cm) Weight: 202 lb (91.6 kg) IBW/kg (Calculated) : 86.8  Temp (24hrs), Avg:103.9 F (39.9 C), Min:103.9 F (39.9 C), Max:103.9 F (39.9 C)  Recent Labs  Lab 02/24/18 1106 02/24/18 1139 02/24/18 1321  WBC 1.6*  --   --   CREATININE 1.17  --   --   LATICACIDVEN  --  2.27* 2.06*    Estimated Creatinine Clearance: 73.2 mL/min (by C-G formula based on SCr of 1.17 mg/dL).    No Known Allergies  Antimicrobials this admission:  7/9 Vanc >> 7/9 Zosyn >>  Dose adjustments this admission:   Microbiology results:  7/9 BCx: 7/9 UCx:   Thank you for allowing pharmacy to be a part of this patient's care.  Peggyann Juba, PharmD, BCPS Pager: [phone] 02/24/2018 4:02 PM

## 2018-02-25 DIAGNOSIS — R1310 Dysphagia, unspecified: Secondary | ICD-10-CM

## 2018-02-25 DIAGNOSIS — F329 Major depressive disorder, single episode, unspecified: Secondary | ICD-10-CM

## 2018-02-25 DIAGNOSIS — D6959 Other secondary thrombocytopenia: Secondary | ICD-10-CM

## 2018-02-25 DIAGNOSIS — C159 Malignant neoplasm of esophagus, unspecified: Secondary | ICD-10-CM

## 2018-02-25 DIAGNOSIS — R739 Hyperglycemia, unspecified: Secondary | ICD-10-CM

## 2018-02-25 DIAGNOSIS — T451X5A Adverse effect of antineoplastic and immunosuppressive drugs, initial encounter: Secondary | ICD-10-CM

## 2018-02-25 DIAGNOSIS — C799 Secondary malignant neoplasm of unspecified site: Secondary | ICD-10-CM

## 2018-02-25 DIAGNOSIS — J449 Chronic obstructive pulmonary disease, unspecified: Secondary | ICD-10-CM

## 2018-02-25 DIAGNOSIS — R5381 Other malaise: Secondary | ICD-10-CM

## 2018-02-25 DIAGNOSIS — G893 Neoplasm related pain (acute) (chronic): Secondary | ICD-10-CM

## 2018-02-25 DIAGNOSIS — I10 Essential (primary) hypertension: Secondary | ICD-10-CM

## 2018-02-25 DIAGNOSIS — N179 Acute kidney failure, unspecified: Secondary | ICD-10-CM

## 2018-02-25 LAB — CBC
HCT: 29.5 % — ABNORMAL LOW (ref 39.0–52.0)
HEMOGLOBIN: 9.9 g/dL — AB (ref 13.0–17.0)
MCH: 33.3 pg (ref 26.0–34.0)
MCHC: 33.6 g/dL (ref 30.0–36.0)
MCV: 99.3 fL (ref 78.0–100.0)
Platelets: 38 10*3/uL — ABNORMAL LOW (ref 150–400)
RBC: 2.97 MIL/uL — AB (ref 4.22–5.81)
RDW: 13.8 % (ref 11.5–15.5)
WBC: 2.6 10*3/uL — ABNORMAL LOW (ref 4.0–10.5)

## 2018-02-25 LAB — ABO/RH: ABO/RH(D): A POS

## 2018-02-25 LAB — TYPE AND SCREEN
ABO/RH(D): A POS
Antibody Screen: NEGATIVE

## 2018-02-25 LAB — BASIC METABOLIC PANEL
Anion gap: 7 (ref 5–15)
BUN: 19 mg/dL (ref 8–23)
CHLORIDE: 104 mmol/L (ref 98–111)
CO2: 27 mmol/L (ref 22–32)
Calcium: 8.3 mg/dL — ABNORMAL LOW (ref 8.9–10.3)
Creatinine, Ser: 0.99 mg/dL (ref 0.61–1.24)
GFR calc Af Amer: >60 mL/min (ref >60)
GLUCOSE: 371 mg/dL — AB (ref 70–99)
POTASSIUM: 4 mmol/L (ref 3.5–5.1)
Sodium: 138 mmol/L (ref 135–145)

## 2018-02-25 LAB — GLUCOSE, CAPILLARY
GLUCOSE-CAPILLARY: 325 mg/dL — AB (ref 70–99)
GLUCOSE-CAPILLARY: 322 mg/dL — AB (ref 70–99)
Glucose-Capillary: 318 mg/dL — ABNORMAL HIGH (ref 70–99)
Glucose-Capillary: 234 mg/dL — ABNORMAL HIGH (ref 70–99)

## 2018-02-25 LAB — PHOSPHORUS: Phosphorus: 2.5 mg/dL (ref 2.5–4.6)

## 2018-02-25 LAB — MAGNESIUM: MAGNESIUM: 1.7 mg/dL (ref 1.7–2.4)

## 2018-02-25 LAB — TROPONIN I: Troponin I: 1.1 ng/mL (ref <0.03)

## 2018-02-25 MED ORDER — INSULIN ASPART 100 UNIT/ML ~~LOC~~ SOLN
0.0000 [IU] | Freq: Three times a day (TID) | SUBCUTANEOUS | Status: DC
  Administered 2018-02-25: 15 [IU] via SUBCUTANEOUS
  Administered 2018-02-25 – 2018-02-26 (×3): 11 [IU] via SUBCUTANEOUS
  Administered 2018-02-26: 15 [IU] via SUBCUTANEOUS
  Administered 2018-02-27: 4 [IU] via SUBCUTANEOUS
  Administered 2018-02-27: 7 [IU] via SUBCUTANEOUS

## 2018-02-25 MED ORDER — TOPIRAMATE 100 MG PO TABS
100.0000 mg | ORAL_TABLET | Freq: Every day | ORAL | Status: DC
  Administered 2018-02-25 – 2018-02-27 (×3): 100 mg via ORAL
  Filled 2018-02-25 (×3): qty 1

## 2018-02-25 MED ORDER — INSULIN ASPART 100 UNIT/ML ~~LOC~~ SOLN: 2.0000 [IU] | SUBCUTANEOUS | Status: DC

## 2018-02-25 MED ORDER — TOPIRAMATE 25 MG PO TABS
50.0000 mg | ORAL_TABLET | Freq: Every day | ORAL | Status: DC
  Administered 2018-02-25 – 2018-02-26 (×2): 50 mg via ORAL
  Filled 2018-02-25 (×2): qty 2

## 2018-02-25 MED ORDER — INSULIN ASPART 100 UNIT/ML ~~LOC~~ SOLN
0.0000 [IU] | Freq: Every day | SUBCUTANEOUS | Status: DC
  Administered 2018-02-25 – 2018-02-26 (×2): 2 [IU] via SUBCUTANEOUS

## 2018-02-25 MED ORDER — ESCITALOPRAM OXALATE 20 MG PO TABS
20.0000 mg | ORAL_TABLET | Freq: Every day | ORAL | Status: DC
  Administered 2018-02-25 – 2018-02-27 (×3): 20 mg via ORAL
  Filled 2018-02-25 (×3): qty 1

## 2018-02-25 MED ORDER — PANTOPRAZOLE SODIUM 40 MG PO TBEC
40.0000 mg | DELAYED_RELEASE_TABLET | ORAL | Status: DC
Start: 2018-02-25 — End: 2018-02-27
  Administered 2018-02-25 – 2018-02-26 (×2): 40 mg via ORAL
  Filled 2018-02-25 (×2): qty 1

## 2018-02-25 MED ORDER — INSULIN ASPART 100 UNIT/ML ~~LOC~~ SOLN
4.0000 [IU] | Freq: Three times a day (TID) | SUBCUTANEOUS | Status: DC
  Administered 2018-02-25 – 2018-02-27 (×7): 4 [IU] via SUBCUTANEOUS

## 2018-02-25 NOTE — Progress Notes (Signed)
Inpatient Diabetes Program Recommendations  AACE/ADA: New Consensus Statement on Inpatient Glycemic Control (2015)  Target Ranges:  Prepandial:   less than 140 mg/dL      Peak postprandial:   less than 180 mg/dL (1-2 hours)      Critically ill patients:  140 - 180 mg/dL   Lab Results  Component Value Date   GLUCAP 325 (H) 02/25/2018   HGBA1C 7.8 (A) 01/14/2018    Review of Glycemic Control  Diabetes history: DM2 Outpatient Diabetes medications: 70/30 70-100 units QAM Current orders for Inpatient glycemic control: Novolog 0-20 units tidwc and hs + 4 units tidwc  On Solucortef 50 mg Q6H. Needs basal insulin. HgbA1C - 7.8% - although may not be accurate with low H/H.  Inpatient Diabetes Program Recommendations:     Add Lantus 25 units Q24H. Increase Novolog to 8 units tidwc.  Will follow closely.  Thank you. Lorenda Peck, RD, LDN, CDE Inpatient Diabetes Coordinator [phone]

## 2018-02-25 NOTE — Progress Notes (Signed)
PULMONARY / CRITICAL CARE MEDICINE   Name: Ronald Johnson MRN: [id number] DOB: 02-08-1946    ADMISSION DATE:  02/24/2018 CONSULTATION DATE:  02/24/18  REFERRING MD:  Dr. Wilson Singer / [facility]   CHIEF COMPLAINT:  Fever, SOB   HISTORY OF PRESENT ILLNESS:   72 y/o M with PMH of esophageal cancer s/p esophageal resection who presented to [facility] ER on 7/9 with reports of fever and shortness of breath.    The patient's wife provides information as patient is unable.  She states he has been receiving chemotherapy and has been feeling weak, sleeping more and having shortness of breath.  in addition, he has had a productive cough.  The patient woke am of presentation and his wife reported he was weak.  She called EMS and they "checked him out" and his vitals were okay.  Approximately one hour later she noted he was shaking and she checked his temperature which was up to 103.  He was transported to the ER and found to be hypotensive.  He received 4L of LR, empiric antibiotics.  Initial labs- Na 139, K 4.1, Sr 1.17, troponin 0.13, troponin 2.27, WBC 1.6, Hgb 11 and platelets 44.  He was started on levophed for hypotension.   PCCM called for ICU admission.  SUBJECTIVE:  Feels better this AM, off pressors  VITAL SIGNS: BP (!) 148/56   Pulse (!) 48   Temp (!) 97.4 F (36.3 C) (Axillary)   Resp 20   Ht 6\' 4"  (1.93 m)   Wt 208 lb 1.8 oz (94.4 kg)   SpO2 97%   BMI 25.33 kg/m   HEMODYNAMICS:    VENTILATOR SETTINGS:    INTAKE / OUTPUT: I/O last 3 completed shifts: In: 5066.8 [P.O.:50; I.V.:1165.5; IV Piggyback:3851.3] Out: 575 [Urine:575]  PHYSICAL EXAMINATION: General: Adult male, resting in exam bed, NAD Neuro:  Alert and interactive, moving all ext to command HEENT:  [place]/AT, PERRL, EOM-I and MMM Cardiovascular: RRR, Nl S1/S2 and -M/R/G Lungs:  CTA bilaterally Abdomen: Soft, NT, ND and +BS Musculoskeletal:  No acute deformities  Skin:  Warm/dry, no edema   LABS:  BMET Recent Labs  Lab  02/24/18 1106 02/24/18 1817  NA 139 136  K 4.1 4.1  CL 102 104  CO2 26 24  BUN 21 18  CREATININE 1.17 0.95  GLUCOSE 288* 261*   Electrolytes Recent Labs  Lab 02/24/18 1106 02/24/18 1817  CALCIUM 8.9 7.9*   CBC Recent Labs  Lab 02/24/18 1106 02/24/18 1817  WBC 1.6* 3.2*  HGB 11.0* 10.0*  HCT 33.2* 30.0*  PLT 44* 39*   Coag's Recent Labs  Lab 02/24/18 1106 02/24/18 1817  APTT  --  35  INR 1.23 1.44   Sepsis Markers Recent Labs  Lab 02/24/18 1321 02/24/18 1817 02/24/18 2110  LATICACIDVEN 2.06* 1.4 1.5  PROCALCITON  --  7.60  --    ABG No results for input(s): PHART, PCO2ART, PO2ART in the last 168 hours.  Liver Enzymes Recent Labs  Lab 02/24/18 1106 02/24/18 1817  AST 21 21  ALT 19 17  ALKPHOS 85 71  BILITOT 0.8 0.8  ALBUMIN 3.3* 2.7*    Cardiac Enzymes Recent Labs  Lab 02/24/18 1817 02/24/18 2355  TROPONINI 1.28* 1.10*    Glucose No results for input(s): GLUCAP in the last 168 hours.  Imaging Ct Angio Chest Pe W/cm &/or Wo Cm  Result Date: 02/24/2018 CLINICAL DATA:  Shortness of breath. History of esophageal and thyroid cancer. EXAM: CT ANGIOGRAPHY CHEST  WITH CONTRAST TECHNIQUE: Multidetector CT imaging of the chest was performed using the standard protocol during bolus administration of intravenous contrast. Multiplanar CT image reconstructions and MIPs were obtained to evaluate the vascular anatomy. CONTRAST:  170mL ISOVUE-370 IOPAMIDOL (ISOVUE-370) INJECTION 76% COMPARISON:  Radiograph of same day.  CT scan of Dec 23, 2017. FINDINGS: Cardiovascular: Small filling defects seen into lower lobe branches of the left pulmonary artery concerning for small peripheral pulmonary emboli. Atherosclerosis of thoracic aorta is noted without aneurysm or dissection. Left-sided Port-A-Cath is again noted. Probable coronary artery calcifications are noted. No pericardial effusion is noted. Normal cardiac size. Mediastinum/Nodes: Status post gastric pull-through  procedure for treatment of esophageal cancer. Continued presence of abnormal soft tissue density in the right thoracic inlet which is grossly unchanged in size or appearance compared to prior exam. This surrounds the proximal right subclavian and carotid arteries. This mass also extends to the right thyroid lobe. No definite adenopathy is noted. Lungs/Pleura: Interval development of large loculated right pleural effusion is noted. Adjacent subsegmental atelectasis is noted in the right lower lobe. 9 mm nodule is noted in right lower lobe which was present on prior exam. No pneumothorax is noted. Bulla formation is noted in both lung apices. Stable 17 x 8 mm nodular density is noted laterally in left lower lobe. Upper Abdomen: No acute abnormality. Musculoskeletal: Stable sclerotic lesion is seen in T1 vertebral body consistent with metastatic disease. No other significant osseous abnormality is noted. Review of the MIP images confirms the above findings. IMPRESSION: Small filling defects are seen in 2 lower lobe branches of the left pulmonary artery concerning for small peripheral pulmonary emboli. Status post gastric pull-through procedure for treatment of esophageal cancer. Stable abnormal soft tissue density seen in right thoracic inlet concerning for malignancy. Interval development of large loculated right pleural effusion with adjacent subsegmental atelectasis of right lower lobe. Stable bilateral pulmonary nodules are noted as described above. Stable sclerotic lesion seen in T1 vertebral body most consistent with metastatic disease. Aortic Atherosclerosis (ICD10-I70.0). Electronically Signed   By: Marijo Conception, M.D.   On: 02/24/2018 13:17   Dg Chest Port 1 View  Result Date: 02/24/2018 CLINICAL DATA:  Esophageal malignancy with most recent chemotherapy last week. Patient is febrile with shortness of breath and decreased oxygen saturation. EXAM: PORTABLE CHEST 1 VIEW COMPARISON:  CT scan of the chest of  Dec 23, 2017 FINDINGS: The lungs are well-expanded. There is increased interstitial density on the right consistent with posterior layering of a moderate-sized pleural effusion. The pulmonary interstitium is also more conspicuous on the right than on the previous study. The left lung is well-expanded and clear. The heart and pulmonary vascularity are normal. The power port catheter tip projects over the distal third of the SVC. IMPRESSION: Interval further accumulation of pleural fluid on the right which is layering posteriorly. Mildly increased interstitial markings on the right as compared to the left may reflect asymmetric pulmonary edema of cardiac or noncardiac cause. It could also reflect lymphatic obstruction. Repeat chest CT scanning is recommended. Thoracic aortic atherosclerosis. Electronically Signed   By: David  Martinique M.D.   On: 02/24/2018 12:33   STUDIES:  CTA Chest 7/9 >> small filling defects in 2 lower lobe branches of the left pulmonary artery concerning for small peripheral pulmonary emboli, status post gastric pull through procedure for esophageal cancer, stable abnormal soft tissue density seen in right thoracic inlet concerning for malignancy.  Interval development of large loculated right pleural effusion, stable  bilateral pulmonary nodules, stable sclerotic lesion seen in T1 vertebral body most consistent with metastatic disease.   CULTURES: BCx2 7/9 >>  UC 7/9 >>  MRSA screen negative  ANTIBIOTICS: Vanco 7/9 >> 7/10 Zosyn 7/9 >>   SIGNIFICANT EVENTS: 7/09  Admit with fever, concern for sepsis  LINES/TUBES: L Chest [facility] 7/9 >>   I reviewed CXR myself, no acute disease noted, chronic for the most part  DISCUSSION: 72 y/o M with esophageal cancer admitted with fever to 103 and concern for septic shock. Suspected pulmonary source.  DNR/DNI.  If declines, would want comfort measures.  ASSESSMENT / PLAN:  PULMONARY A: Large Right Pleural Effusion - loculated, concern  for infection.   Hx Tobacco Abuse (quit 2010) P:   CXR to intermittent at this point Titrate O2 for sat of 88-92%  CARDIOVASCULAR A:  Septic Shock - s/p 4L LR in ER Bradycardia  Hx HTN, HLD P:  Transfer to tele D/C levophed Port care per protocol   RENAL A:   At Risk AKI  P:   BMET in AM Monitor UOP Replace electrolytes as indicated Avoid nephrotoxic medications  GASTROINTESTINAL A:   Esophageal Cancer s/p Resection - on chemotherapy  P:   Heart healthy, carb modified diet, soft  HEMATOLOGIC A:   Pancytopenia - in setting of chemotherapy, sepsis  P:  Trend CBC  Monitor for signs of bleeding Transfuse per ICU guidlines  INFECTIOUS A:   Right Loculated Pleural Effusion  P:   ABX as above  Follow cultures  D/C vanc  ENDOCRINE A:   DM history P:   Monitor glucose on BMP  ISS standard scale Diabetic coordinator consult  NEUROLOGIC A:   Pain P:   RASS goal: n/a  FAMILY  - Updates: Patient updated bedside.  Discussed with [facility] MD, transfer to tele and to [facility] service with PCCM off 7/11.  - Inter-disciplinary family meet or Palliative Care meeting due by:  7/16  Rush Farmer, M.D. [facility] Pulmonary/Critical Care Medicine. Pager: [phone]. After hours pager: [phone].

## 2018-02-25 NOTE — Progress Notes (Signed)
Pt had a run of bradycardia, dropped to 37. This nurse was in the room and conversing with patient while it occurred. Asymptomatic throughout. Heart rate quickly returned to 50 and then up to 85. Will continue to monitor.

## 2018-02-25 NOTE — Progress Notes (Signed)
IP PROGRESS NOTE  Subjective:   Ronald Johnson is known to me with a history of metastatic esophagus cancer.  He completed a third cycle of FOLFOX chemotherapy beginning 02/16/2018.  He received G-CSF on 02/18/2018.  He reports severe malaise following this cycle of chemotherapy.  He developed a fever and chills yesterday and presented to the emergency room.  He denies diarrhea, mouth sores, and neuropathy symptoms.  He has noted improvement in dysphasia.  He feels better today.  Objective: Vital signs in last 24 hours: Blood pressure (!) 142/68, pulse 66, temperature 97.9 F (36.6 C), temperature source Oral, resp. rate 18, height 6\' 4"  (1.93 m), weight 208 lb 1.8 oz (94.4 kg), SpO2 98 %.  Intake/Output from previous day: 07/09 0701 - 07/10 0700 In: 5066.8 [P.O.:50; I.V.:1165.5; IV Piggyback:3851.3] Out: 575 [Urine:575]  Physical Exam:  HEENT: No thrush or ulcers Lungs: Clear bilaterally Cardiac: Regular rate and rhythm Abdomen: No hepatomegaly, nontender Extremities: No leg edema   Portacath/PICC-without erythema  Lab Results: Recent Labs    02/24/18 1817 02/25/18 1046  WBC 3.2* 2.6*  HGB 10.0* 9.9*  HCT 30.0* 29.5*  PLT 39* 38*    BMET Recent Labs    02/24/18 1817 02/25/18 1046  NA 136 138  K 4.1 4.0  CL 104 104  CO2 24 27  GLUCOSE 261* 371*  BUN 18 19  CREATININE 0.95 0.99  CALCIUM 7.9* 8.3*    Lab Results  Component Value Date   CEA1 22.82 (H) 02/16/2018    Studies/Results: Ct Angio Chest Pe W/cm &/or Wo Cm  Result Date: 02/24/2018 CLINICAL DATA:  Shortness of breath. History of esophageal and thyroid cancer. EXAM: CT ANGIOGRAPHY CHEST WITH CONTRAST TECHNIQUE: Multidetector CT imaging of the chest was performed using the standard protocol during bolus administration of intravenous contrast. Multiplanar CT image reconstructions and MIPs were obtained to evaluate the vascular anatomy. CONTRAST:  156mL ISOVUE-370 IOPAMIDOL (ISOVUE-370) INJECTION 76% COMPARISON:   Radiograph of same day.  CT scan of Dec 23, 2017. FINDINGS: Cardiovascular: Small filling defects seen into lower lobe branches of the left pulmonary artery concerning for small peripheral pulmonary emboli. Atherosclerosis of thoracic aorta is noted without aneurysm or dissection. Left-sided Port-A-Cath is again noted. Probable coronary artery calcifications are noted. No pericardial effusion is noted. Normal cardiac size. Mediastinum/Nodes: Status post gastric pull-through procedure for treatment of esophageal cancer. Continued presence of abnormal soft tissue density in the right thoracic inlet which is grossly unchanged in size or appearance compared to prior exam. This surrounds the proximal right subclavian and carotid arteries. This mass also extends to the right thyroid lobe. No definite adenopathy is noted. Lungs/Pleura: Interval development of large loculated right pleural effusion is noted. Adjacent subsegmental atelectasis is noted in the right lower lobe. 9 mm nodule is noted in right lower lobe which was present on prior exam. No pneumothorax is noted. Bulla formation is noted in both lung apices. Stable 17 x 8 mm nodular density is noted laterally in left lower lobe. Upper Abdomen: No acute abnormality. Musculoskeletal: Stable sclerotic lesion is seen in T1 vertebral body consistent with metastatic disease. No other significant osseous abnormality is noted. Review of the MIP images confirms the above findings. IMPRESSION: Small filling defects are seen in 2 lower lobe branches of the left pulmonary artery concerning for small peripheral pulmonary emboli. Status post gastric pull-through procedure for treatment of esophageal cancer. Stable abnormal soft tissue density seen in right thoracic inlet concerning for malignancy. Interval development of large  loculated right pleural effusion with adjacent subsegmental atelectasis of right lower lobe. Stable bilateral pulmonary nodules are noted as described  above. Stable sclerotic lesion seen in T1 vertebral body most consistent with metastatic disease. Aortic Atherosclerosis (ICD10-I70.0). Electronically Signed   By: Marijo Conception, M.D.   On: 02/24/2018 13:17   Dg Chest Port 1 View  Result Date: 02/24/2018 CLINICAL DATA:  Esophageal malignancy with most recent chemotherapy last week. Patient is febrile with shortness of breath and decreased oxygen saturation. EXAM: PORTABLE CHEST 1 VIEW COMPARISON:  CT scan of the chest of Dec 23, 2017 FINDINGS: The lungs are well-expanded. There is increased interstitial density on the right consistent with posterior layering of a moderate-sized pleural effusion. The pulmonary interstitium is also more conspicuous on the right than on the previous study. The left lung is well-expanded and clear. The heart and pulmonary vascularity are normal. The power port catheter tip projects over the distal third of the SVC. IMPRESSION: Interval further accumulation of pleural fluid on the right which is layering posteriorly. Mildly increased interstitial markings on the right as compared to the left may reflect asymmetric pulmonary edema of cardiac or noncardiac cause. It could also reflect lymphatic obstruction. Repeat chest CT scanning is recommended. Thoracic aortic atherosclerosis. Electronically Signed   By: David  Martinique M.D.   On: 02/24/2018 12:33    Medications: I have reviewed the patient's current medications.  Assessment/Plan:  1. 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 EUS 03/16/2015 confirmed a clinical stage IIb (uT3,uN1) tumor ? Staging CTs of the chest, abdomen, and pelvis with no evidence of metastatic disease ? Initiation of radiation 04/19/2015, cycle 1 Taxol/carboplatin 04/20/2015: Radiation completed 05/29/2015; the fifth and final week of Taxol/carboplatin 05/18/2015 ? Restaging CTs 07/20/2015 revealed no evidence of metastatic disease, incidental left lower lobe  pulmonary embolism ? Transhiatal total esophagectomy 07/25/2015 confirmed a ypT3, ypN2 with 4/5 positive lymph nodes, lymphovascular invasion, perineural invasion, negative resection margins ? PD1 score-0 ? CT neck 07/25/2017-4-5 cm mass in the right lower neck upper mediastinum immediately adjacent to the esophageal anastomosis. 12 mm mass within the right parotid enlarged since the previous PET study where it was about half that size. Question few small pulmonary nodules. Borderline enlarged mediastinal lymph nodes. ? CTs brain/chest/abdomen/pelvis 08/06/2017-ill-defined mass at the right thoracic inlet extending into the right superior mediastinum; new bilateral but right much greater than left peripheral reticulonodular and groundglass opacities; scattered small paratracheal and anterior mediastinal lymph nodes; small bilateral axillary lymph nodes. Brain CT with no evidence of metastasis. ? 08/26/2017 biopsy right supraclavicular lymph node-very scant fragment of benign lymph node. ? PET scan 09/11/2017-poorly marginated heterogeneously hypermetabolic soft tissue mass at the right thoracic inlet extending laterally from the esophagogastric anastomosis in the neck. No definite hypermetabolic metastatic disease.Patchy tree-in-bud opacities throughout the lungs significantly decreased. Persistent scattered subcentimeter solid pulmonary nodules in both lungs stable to decreased. Stable irregular 1.7 cm nodular bandlike opacity anterior left lower lobe with associated low-level metabolism. Nonspecific mildly hypermetabolic subcarinal and bilateral hilar lymph nodes. Nonspecific mildly hypermetabolic nodular focus of soft tissue in the upper left retroperitoneum anterior to the adrenal gland. ? Upper endoscopy 09/15/2017-at 20 cm from the incisors tight stricture at the anastomosis measuring only a few millimeters in diameter. Surrounding tissues firm, slightly friable, noncompliant. Stricture was  dilated with a 15 mm balloon several times. Diameter of stricture improved but still would not permit passage of a standard upper endoscope. Friable areas biopsied-invasive adenocarcinoma. ?  Initiation of radiation and concurrent capecitabine 2/4/2019completed 10/30/2017 ? CTs neck and chest 12/23/2017-stable abnormal soft tissue at the right thoracic inlet with encasement of the proximal right common carotid artery, interval development of sclerotic lesions at C7 and T1, new indeterminate right lung opacities ? Cycle 1 FOLFOX 01/08/2018 ? Cycle 2 FOLFOX 02/02/2018 (Oxaliplatin dose reduced due to neutropenia) ? Cycle 3 FOLFOX 02/16/2018, white cell growth factor support added ? CT 02/24/2018- progressive loculated right effusion, stable lung nodules, stable thoracic inlet soft tissue, small peripheral pulmonary emboli  2. Postprandial subxiphoid pain secondary to #1  3. Diabetes-elevated blood sugar readings at the [facility] and at home  4. COPD  5. Depression-Improved with Wellbutrin  6. Hypertension  7. History of adenomatous colon polyps  8. History of diabetic related neuropathy, peripheral arterial disease, and nephropathy  9. History of nausea-likely related to radiation esophagitis/gastritis  10. Incidental pulmonary embolism noted on the CT 07/20/2015, treated with Xarelto  11. Admission 08/18/2015 with GI bleeding while on Xarelto, anticoagulation therapy discontinued  12. Dysphagia-improved with esophageal dilatation procedures by Dr. Henrene Pastor, progressive dysphagia secondary to local recurrence of esophagus cancer with obstruction at the esophagus anastomosis  13. Pancytopenia secondary to chemotherapy  14. CT scan abdomen/pelvis 10/09/2016 (done to evaluate persistent nausea, epigastric pain)-no acute findings or explanation for the patient's symptoms. Resolving postsurgical changes from presumed previous esophagectomy and gastric pull-through.  No evidence of metastatic disease.  15. Admission 02/24/2018 with sepsis syndrome- probable pulmonary source for infection with a right effusion   Mr. Goble was admitted yesterday with sepsis syndrome.  His clinical status has improved.  He appears well this morning.  He is now at day 9 following cycle 3 FOLFOX.  He had mild neutropenia on hospital admission.  The white count should recover over the next several days as he received G-CSF support following chemotherapy last week.  He has thrombocytopenia secondary to chemotherapy and sepsis.  Recommendations: 1.  Continue management of sepsis syndrome per critical care medicine 2.  Daily CBC with differential, transfuse platelets for a count of less than 10,000 or bleeding 3.  Follow-up cultures, consider diagnostic thoracentesis 4.  Decision on anticoagulation for the incidental small pulmonary emboli per critical care medicine   Please call Oncology as needed.  He is scheduled for a follow-up appointment at the Cancer center 03/02/2018.       LOS: 1 day   Betsy Coder, MD   02/25/2018, 1:35 PM

## 2018-02-26 ENCOUNTER — Inpatient Hospital Stay (HOSPITAL_COMMUNITY): Payer: Medicare Other

## 2018-02-26 DIAGNOSIS — I2782 Chronic pulmonary embolism: Secondary | ICD-10-CM

## 2018-02-26 DIAGNOSIS — R748 Abnormal levels of other serum enzymes: Secondary | ICD-10-CM

## 2018-02-26 DIAGNOSIS — J910 Malignant pleural effusion: Secondary | ICD-10-CM | POA: Diagnosis present

## 2018-02-26 DIAGNOSIS — R6521 Severe sepsis with septic shock: Secondary | ICD-10-CM

## 2018-02-26 DIAGNOSIS — E119 Type 2 diabetes mellitus without complications: Secondary | ICD-10-CM

## 2018-02-26 DIAGNOSIS — Z794 Long term (current) use of insulin: Secondary | ICD-10-CM

## 2018-02-26 DIAGNOSIS — D61818 Other pancytopenia: Secondary | ICD-10-CM

## 2018-02-26 DIAGNOSIS — A419 Sepsis, unspecified organism: Secondary | ICD-10-CM | POA: Diagnosis present

## 2018-02-26 DIAGNOSIS — K219 Gastro-esophageal reflux disease without esophagitis: Secondary | ICD-10-CM

## 2018-02-26 DIAGNOSIS — J90 Pleural effusion, not elsewhere classified: Secondary | ICD-10-CM

## 2018-02-26 LAB — CBC WITH DIFFERENTIAL/PLATELET
Basophils Absolute: 0 10*3/uL (ref 0.0–0.1)
Basophils Relative: 0 %
Eosinophils Absolute: 0 10*3/uL (ref 0.0–0.7)
Eosinophils Relative: 1 %
HCT: 28.2 % — ABNORMAL LOW (ref 39.0–52.0)
Hemoglobin: 9.4 g/dL — ABNORMAL LOW (ref 13.0–17.0)
Lymphocytes Relative: 24 %
Lymphs Abs: 0.7 10*3/uL (ref 0.7–4.0)
MCH: 32.8 pg (ref 26.0–34.0)
MCHC: 33.3 g/dL (ref 30.0–36.0)
MCV: 98.3 fL (ref 78.0–100.0)
MONO ABS: 0.4 10*3/uL (ref 0.1–1.0)
Monocytes Relative: 14 %
NEUTROS PCT: 61 %
Neutro Abs: 1.8 10*3/uL (ref 1.7–7.7)
Platelets: 36 10*3/uL — ABNORMAL LOW (ref 150–400)
RBC: 2.87 MIL/uL — AB (ref 4.22–5.81)
RDW: 13.7 % (ref 11.5–15.5)
WBC: 2.9 10*3/uL — ABNORMAL LOW (ref 4.0–10.5)

## 2018-02-26 LAB — BASIC METABOLIC PANEL
ANION GAP: 7 (ref 5–15)
BUN: 14 mg/dL (ref 8–23)
CO2: 27 mmol/L (ref 22–32)
Calcium: 8.4 mg/dL — ABNORMAL LOW (ref 8.9–10.3)
Chloride: 104 mmol/L (ref 98–111)
Creatinine, Ser: 0.85 mg/dL (ref 0.61–1.24)
GFR calc Af Amer: >60 mL/min (ref >60)
Glucose, Bld: 267 mg/dL — ABNORMAL HIGH (ref 70–99)
POTASSIUM: 3.5 mmol/L (ref 3.5–5.1)
SODIUM: 138 mmol/L (ref 135–145)

## 2018-02-26 LAB — URINALYSIS, ROUTINE W REFLEX MICROSCOPIC
Bacteria, UA: NONE SEEN
Bilirubin Urine: NEGATIVE
Glucose, UA: >=500 mg/dL — AB
Ketones, ur: NEGATIVE mg/dL
Leukocytes, UA: NEGATIVE
Nitrite: NEGATIVE
Protein, ur: NEGATIVE mg/dL
RBC / HPF: >50 RBC/hpf — ABNORMAL HIGH (ref 0–5)
Specific Gravity, Urine: 1.012 (ref 1.005–1.030)
pH: 7 (ref 5.0–8.0)

## 2018-02-26 LAB — TROPONIN I
Troponin I: 0.3 ng/mL (ref <0.03)
Troponin I: 0.32 ng/mL (ref <0.03)
Troponin I: 0.36 ng/mL (ref <0.03)

## 2018-02-26 LAB — ECHOCARDIOGRAM COMPLETE
HEIGHTINCHES: 76 in
WEIGHTICAEL: 3329.83 [oz_av]

## 2018-02-26 LAB — PHOSPHORUS: Phosphorus: 2.2 mg/dL — ABNORMAL LOW (ref 2.5–4.6)

## 2018-02-26 LAB — MAGNESIUM: MAGNESIUM: 1.7 mg/dL (ref 1.7–2.4)

## 2018-02-26 LAB — GLUCOSE, CAPILLARY
GLUCOSE-CAPILLARY: 317 mg/dL — AB (ref 70–99)
Glucose-Capillary: 269 mg/dL — ABNORMAL HIGH (ref 70–99)
Glucose-Capillary: 295 mg/dL — ABNORMAL HIGH (ref 70–99)
Glucose-Capillary: 215 mg/dL — ABNORMAL HIGH (ref 70–99)

## 2018-02-26 MED ORDER — POTASSIUM CHLORIDE CRYS ER 20 MEQ PO TBCR
40.0000 meq | EXTENDED_RELEASE_TABLET | Freq: Once | ORAL | Status: AC
  Administered 2018-02-26: 40 meq via ORAL
  Filled 2018-02-26: qty 2

## 2018-02-26 MED ORDER — HYDROCORTISONE NA SUCCINATE PF 100 MG IJ SOLR
50.0000 mg | Freq: Two times a day (BID) | INTRAMUSCULAR | Status: DC
  Administered 2018-02-26 – 2018-02-27 (×2): 50 mg via INTRAVENOUS
  Filled 2018-02-26 (×2): qty 2

## 2018-02-26 MED ORDER — MAGNESIUM SULFATE 4 GM/100ML IV SOLN
4.0000 g | Freq: Once | INTRAVENOUS | Status: AC
  Administered 2018-02-26: 4 g via INTRAVENOUS
  Filled 2018-02-26: qty 100

## 2018-02-26 MED ORDER — PROCHLORPERAZINE EDISYLATE 10 MG/2ML IJ SOLN: 10.0000 mg | Freq: Four times a day (QID) | INTRAMUSCULAR | Status: DC | PRN

## 2018-02-26 MED ORDER — INSULIN GLARGINE 100 UNIT/ML ~~LOC~~ SOLN
25.0000 [IU] | Freq: Every day | SUBCUTANEOUS | Status: DC
  Administered 2018-02-26 – 2018-02-27 (×2): 25 [IU] via SUBCUTANEOUS
  Filled 2018-02-26 (×3): qty 0.25

## 2018-02-26 MED ORDER — DIAZEPAM 5 MG PO TABS: 5.0000 mg | ORAL_TABLET | Freq: Four times a day (QID) | ORAL | Status: DC | PRN

## 2018-02-26 MED ORDER — OXYCODONE HCL 5 MG PO TABS: 5.0000 mg | ORAL_TABLET | Freq: Every day | ORAL | Status: DC | PRN

## 2018-02-26 MED ORDER — KETOTIFEN FUMARATE 0.025 % OP SOLN
1.0000 [drp] | Freq: Two times a day (BID) | OPHTHALMIC | Status: DC | PRN
  Filled 2018-02-26: qty 5

## 2018-02-26 MED ORDER — ONDANSETRON HCL 4 MG/2ML IJ SOLN
4.0000 mg | Freq: Four times a day (QID) | INTRAMUSCULAR | Status: DC | PRN
  Administered 2018-02-26: 4 mg via INTRAVENOUS
  Filled 2018-02-26: qty 2

## 2018-02-26 NOTE — Progress Notes (Addendum)
PROGRESS NOTE    Ronald Johnson  GUR:[id number] DOB: 10-27-1945 DOA: 02/24/2018 PCP: [name], MD    Brief Narrative:  72 y/o M with PMH of esophageal cancer s/p esophageal resection who presented to [facility] ER on 7/9 with reports of fever and shortness of breath.    The patient's wife provides information as patient is unable.  She states he has been receiving chemotherapy and has been feeling weak, sleeping more and having shortness of breath.  in addition, he has had a productive cough.  The patient woke am of presentation and his wife reported he was weak.  She called EMS and they "checked him out" and his vitals were okay.  Approximately one hour later she noted he was shaking and she checked his temperature which was up to 103.  He was transported to the ER and found to be hypotensive.  He received 4L of LR, empiric antibiotics.  Initial labs- Na 139, K 4.1, Sr 1.17, troponin 0.13, troponin 2.27, WBC 1.6, Hgb 11 and platelets 44.  He was started on levophed for hypotension.   PCCM called for ICU admission.     Assessment & Plan:   Principal Problem:   Severe sepsis with septic shock (HCC) Active Problems:   Pleural effusion on right   Insulin dependent diabetes mellitus (HCC)   Pancytopenia (HCC)  1 severe sepsis with septic shock Likely secondary to loculated right pleural effusion concerning for infection.  Patient was hypotensive on admission given 4 L LR in the ED and placed on levo fed.  Currently off pressors.  Patient on stress dose IV steroids.  Decrease Solu-Medrol to 50 mg IV every 12 hours.  Patient pancultured results pending.  IV antibiotics narrowed to IV Zosyn.  Could likely transition to oral Augmentin to complete a 10 to 14-day course of antibiotic treatment when clinically improved.  Intermittent chest x-ray.  Patient does not want any significant invasive procedures done and as such pleural effusion was not tapped.  Discussed with pulmonary as well as patient and  family.  Continue antibiotics for now and follow.  2.  Loculated right pleural effusion Concerning for an infectious etiology.  Patient wanting conservative treatment and does not want any significant invasive procedures and as such loculated right pleural effusion was not tapped.  Patient currently afebrile with some clinical improvement.  Continue empiric IV antibiotics.  Likely transition from IV Zosyn to oral Augmentin in the next 24 to 48 hours if remains afebrile.  Supportive care.  Chest x-ray intermittently.  Titrate O2 to keep sats between 88 to 92%.  Follow.  Outpatient follow-up with pulmonary.  3.  Bradycardia Stable.  Follow.  4.  Tobacco abuse Quitting 2010.  5.  Hypertension Patient had presented hypotensive on admission.  Antihypertensive medications currently on hold.  Follow.  6.  Gastroesophageal reflux disease Continue PPI.  Graft   #7 GE junction carcinoma/adenocarcinoma Per hematology/oncology.  8.  Insulin-dependent diabetes mellitus CBGs likely been elevated as patient on stress dose IV steroids.  Placed on Lantus 25 units daily.  Sliding scale insulin.  Monitor closely and adjust insulin with steroid taper.  9.  Incidental pulmonary embolism noted on CT 07/20/2015 status post treatment with Xarelto.  10.  Dysphagia Improved with esophageal dilatation procedures per GI, Dr. Henrene Pastor.  Progressive dysphagia secondary to local recurrence of esophageal cancer with obstruction at the esophageal anastomosis.  Continue current diet.  11 incidental pulmonary emboli noted on CT 02/24/2018 Hematology/oncology to reviewed CT scans.  Per hematology oncology consider anticoagulation therapy after review of CT scans and improvement of platelet count.  12.  Debility PT/OT.  13.  Elevated troponin Likely secondary to demand ischemia.  Repeat troponins.  Check a 2D echo.  14.  Pancytopenia Secondary to chemotherapy.  No bleeding noted.  Per oncology.    DVT prophylaxis:  SCDs Code Status: Partial Family Communication: Updated patient and wife at bedside. Disposition Plan: Likely home once afebrile for 24 to 48 hours, clinical improvement and pending PT/ OT evaluation.   Consultants:   PCCM admission  Oncology: Dr. [name]  Procedures:   2D echo 02/26/2018 pending  CT angiogram chest 02/24/2018  Chest x-ray 02/24/2018  CULTURES: BCx2 7/9 >>  UC 7/9 >>  MRSA screen negative     Antimicrobials:  IV Zosyn 02/24/2018  IV vancomycin 02/24/2018 >>> 02/25/2018   Subjective: Patient laying in bed.  States he is feeling some better.  Denies any chest pain or shortness of breath.  Does state he does not want any significant invasive procedures done.  Wife concerned patient has not been out of bed since admission.  Objective: Vitals:   02/25/18 1811 02/25/18 2102 02/26/18 0518 02/26/18 1435  BP: (!) 153/68 133/66 (!) 148/72 (!) 148/81  Pulse: 80 72 77 72  Resp: 19 18 16 18   Temp: 97.8 F (36.6 C) 97.7 F (36.5 C) (!) 97.5 F (36.4 C) 98 F (36.7 C)  TempSrc:  Oral Oral Oral  SpO2: 96% 96% 93% 99%  Weight:      Height:        Intake/Output Summary (Last 24 hours) at 02/26/2018 2010 Last data filed at 02/26/2018 1836 Gross per 24 hour  Intake 2646.46 ml  Output 3325 ml  Net -678.54 ml   Filed Weights   02/24/18 1104 02/24/18 1730  Weight: 91.6 kg (202 lb) 94.4 kg (208 lb 1.8 oz)    Examination:  General exam: Appears calm and comfortable  Respiratory system: Decreased breath sounds in the right base.  Respiratory effort normal. Cardiovascular system: S1 & S2 heard, RRR. No JVD, murmurs, rubs, gallops or clicks. No pedal edema. Gastrointestinal system: Abdomen is nondistended, soft and nontender. No organomegaly or masses felt. Normal bowel sounds heard. Central nervous system: Alert and oriented. No focal neurological deficits. Extremities: Symmetric 5 x 5 power. Skin: No rashes, lesions or ulcers Psychiatry: Judgement and insight  appear normal. Mood & affect appropriate.     Data Reviewed: I have personally reviewed following labs and imaging studies  CBC: Recent Labs  Lab 02/24/18 1106 02/24/18 1817 02/25/18 1046 02/26/18 0414  WBC 1.6* 3.2* 2.6* 2.9*  NEUTROABS 1.0*  --   --  1.8  HGB 11.0* 10.0* 9.9* 9.4*  HCT 33.2* 30.0* 29.5* 28.2*  MCV 99.7 99.7 99.3 98.3  PLT 44* 39* 38* 36*   Basic Metabolic Panel: Recent Labs  Lab 02/24/18 1106 02/24/18 1817 02/25/18 1046 02/26/18 0414  NA 139 136 138 138  K 4.1 4.1 4.0 3.5  CL 102 104 104 104  CO2 26 24 27 27   GLUCOSE 288* 261* 371* 267*  BUN 21 18 19 14   CREATININE 1.17 0.95 0.99 0.85  CALCIUM 8.9 7.9* 8.3* 8.4*  MG  --   --  1.7 1.7  PHOS  --   --  2.5 2.2*   GFR: Estimated Creatinine Clearance: 100.7 mL/min (by C-G formula based on SCr of 0.85 mg/dL). Liver Function Tests: Recent Labs  Lab 02/24/18 1106 02/24/18 1817  AST  21 21  ALT 19 17  ALKPHOS 85 71  BILITOT 0.8 0.8  PROT 6.3* 5.3*  ALBUMIN 3.3* 2.7*   No results for input(s): LIPASE, AMYLASE in the last 168 hours. No results for input(s): AMMONIA in the last 168 hours. Coagulation Profile: Recent Labs  Lab 02/24/18 1106 02/24/18 1817  INR 1.23 1.44   Cardiac Enzymes: Recent Labs  Lab 02/24/18 1817 02/24/18 2355 02/26/18 0954 02/26/18 1439  TROPONINI 1.28* 1.10* 0.30* 0.32*   BNP (last 3 results) No results for input(s): PROBNP in the last 8760 hours. HbA1C: No results for input(s): HGBA1C in the last 72 hours. CBG: Recent Labs  Lab 02/25/18 1650 02/25/18 2148 02/26/18 0739 02/26/18 1222 02/26/18 1642  GLUCAP 322* 234* 269* 317* 295*   Lipid Profile: No results for input(s): CHOL, HDL, LDLCALC, TRIG, CHOLHDL, LDLDIRECT in the last 72 hours. Thyroid Function Tests: No results for input(s): TSH, T4TOTAL, FREET4, T3FREE, THYROIDAB in the last 72 hours. Anemia Panel: No results for input(s): VITAMINB12, FOLATE, FERRITIN, TIBC, IRON, RETICCTPCT in the last 72  hours. Sepsis Labs: Recent Labs  Lab 02/24/18 1139 02/24/18 1321 02/24/18 1817 02/24/18 2110  PROCALCITON  --   --  7.60  --   LATICACIDVEN 2.27* 2.06* 1.4 1.5    Recent Results (from the past 240 hour(s))  Culture, blood (Routine x 2)     Status: None (Preliminary result)   Collection Time: 02/24/18 11:06 AM  Result Value Ref Range Status   Specimen Description   Final    BLOOD PORTA CATH Performed at [facility] [address], [place], [place] [address]    Special Requests   Final    BOTTLES DRAWN AEROBIC AND ANAEROBIC BCAV Performed at [facility], [place] [address], [place], [place] [address]    Culture   Final    NO GROWTH 2 DAYS Performed at [facility] [address] [place], [place] [address]    Report Status PENDING  Incomplete  Culture, blood (Routine x 2)     Status: None (Preliminary result)   Collection Time: 02/24/18 11:11 AM  Result Value Ref Range Status   Specimen Description   Final    BLOOD LEFT ANTECUBITAL Performed at [facility] [address], [place], [place] [address]    Special Requests   Final    BOTTLES DRAWN AEROBIC AND ANAEROBIC Blood Culture adequate volume Performed at [facility] [address], [place], [place] [address]    Culture   Final    NO GROWTH 2 DAYS Performed at [facility] [address] [place], [place] [address]    Report Status PENDING  Incomplete  MRSA PCR Screening     Status: None   Collection Time: 02/24/18  6:08 PM  Result Value Ref Range Status   MRSA by PCR NEGATIVE NEGATIVE Final    Comment:        The GeneXpert MRSA Assay (FDA approved for NASAL specimens only), is one component of a comprehensive MRSA colonization surveillance program. It is not intended to diagnose MRSA infection nor to guide or monitor treatment for MRSA infections. Performed at [facility], [place] [address],  [place], [place] [address]          Radiology Studies: No results found.      Scheduled Meds: . Chlorhexidine Gluconate Cloth  6 each Topical Daily  . escitalopram  20 mg Oral Daily  . hydrocortisone  sod succinate (SOLU-CORTEF) inj  50 mg Intravenous Q12H  . insulin aspart  0-20 Units Subcutaneous TID WC  . insulin aspart  0-5 Units Subcutaneous QHS  . insulin aspart  4 Units Subcutaneous TID WC  . insulin glargine  25 Units Subcutaneous Daily  . mouth rinse  15 mL Mouth Rinse BID  . pantoprazole  40 mg Oral Q24H  . sodium chloride flush  10-40 mL Intracatheter Q12H  . topiramate  100 mg Oral Daily   And  . topiramate  50 mg Oral QHS   Continuous Infusions: . sodium chloride 75 mL/hr at 02/25/18 0205  . sodium chloride 10 mL/hr at 02/25/18 0205  . piperacillin-tazobactam (ZOSYN)  IV 3.375 g (02/26/18 1713)     LOS: 2 days    Time spent: 30 mins    Irine Seal, MD [facility] Pager [phone]  If 7PM-7AM, please contact night-coverage [email or web address] Password [email or web address] 02/26/2018, 8:10 PM

## 2018-02-26 NOTE — Evaluation (Addendum)
Physical Therapy Evaluation Patient Details Name: Ronald Johnson MRN: [id number] DOB: February 23, 1946 Today's Date: 02/26/2018   History of Present Illness  72 y/o M with PMH of esophageal cancer s/p esophageal resection who presented to [name] ER on 7/9 with reports of fever and shortness of breath. CT= progressive loculated right effusion, stable lung nodules, stable thoracic inlet soft tissue, small peripheral pulmonary emboli  Clinical Impression  Pt admitted with above diagnosis. Pt currently with functional limitations due to the deficits listed below (see PT Problem List). Pt  Doing well, amb 25' with RW; pt would benefit from OPPT at d/c; will follow in acute setting   Pt will benefit from skilled PT to increase their independence and safety with mobility to allow discharge to the venue listed below.       Follow Up Recommendations Outpatient PT    Equipment Recommendations  Other (comment)(TBD--?RW vs cane)    Recommendations for Other Services       Precautions / Restrictions Precautions Precautions: Fall Restrictions Weight Bearing Restrictions: No      Mobility  Bed Mobility               General bed mobility comments: pt OOB in chair on arrival  Transfers Overall transfer level: Needs assistance Equipment used: Rolling walker (2 wheeled) Transfers: Sit to/from Stand Sit to Stand: Min guard         General transfer comment: for safety  Ambulation/Gait Ambulation/Gait assistance: Min guard;Supervision Gait Distance (Feet): 60 Feet Assistive device: Rolling walker (2 wheeled) Gait Pattern/deviations: Step-through pattern     General Gait Details: cues for RW position and safety, mildly unsteady initially, improved with distance, no overt LOB; SpO2= 92% on RA at rest, 84% with activity on RA, O2 replaced and O2 returned  to  92%  Stairs            Wheelchair Mobility    Modified Rankin (Stroke Patients Only)       Balance                                              Pertinent Vitals/Pain Pain Assessment: No/denies pain    Home Living Family/patient expects to be discharged to:: Private residence Living Arrangements: Spouse/significant other Available Help at Discharge: Family Type of Home: House Home Access: Stairs to enter   Technical brewer of Steps: 1 Home Layout: Two level;Able to live on main level with bedroom/bathroom Home Equipment: Kasandra Knudsen - single point      Prior Function Level of Independence: Independent               Hand Dominance        Extremity/Trunk Assessment   Upper Extremity Assessment Upper Extremity Assessment: Overall WFL for tasks assessed    Lower Extremity Assessment Lower Extremity Assessment: Overall WFL for tasks assessed       Communication   Communication: No difficulties  Cognition Arousal/Alertness: Awake/alert Behavior During Therapy: WFL for tasks assessed/performed Overall Cognitive Status: Within Functional Limits for tasks assessed                                        General Comments      Exercises     Assessment/Plan    PT Assessment Patient needs  continued PT services  PT Problem List Decreased strength;Decreased mobility;Decreased activity tolerance;Decreased balance;Cardiopulmonary status limiting activity       PT Treatment Interventions Gait training;DME instruction;Functional mobility training;Therapeutic activities;Therapeutic exercise;Patient/family education    PT Goals (Current goals can be found in the Care Plan section)  Acute Rehab PT Goals Patient Stated Goal: feel better PT Goal Formulation: With patient Time For Goal Achievement: 03/05/18 Potential to Achieve Goals: Good    Frequency Min 3X/week   Barriers to discharge        Co-evaluation               AM-PAC PT "6 Clicks" Daily Activity  Outcome Measure Difficulty turning over in bed (including adjusting bedclothes,  sheets and blankets)?: A Lot Difficulty moving from lying on back to sitting on the side of the bed? : A Lot Difficulty sitting down on and standing up from a chair with arms (e.g., wheelchair, bedside commode, etc,.)?: A Little Help needed moving to and from a bed to chair (including a wheelchair)?: A Little Help needed walking in hospital room?: A Little Help needed climbing 3-5 steps with a railing? : A Little 6 Click Score: 16    End of Session Equipment Utilized During Treatment: Gait belt Activity Tolerance: Patient tolerated treatment well Patient left: with call bell/phone within reach;in chair;with chair alarm set Nurse Communication: Mobility status      Time: 3244-0102 PT Time Calculation (min) (ACUTE ONLY): 23 min   Charges:   PT Evaluation $PT Eval Low Complexity: 1 Low PT Treatments $Gait Training: 8-22 mins   PT G CodesKenyon Ana, PT Pager: [phone] 02/26/2018   Kenyon Ana 02/26/2018, 1:34 PM

## 2018-02-26 NOTE — Progress Notes (Deleted)
Pt to be discharged to home this evening. Pt and Pt's Wife given discharge teaching and instructions including Medications and schedules. Prostatectomy home care instructions reviewed with Pt and Pt's Wife. Pt able to return demonstration of changing from foley bag to leg bag and demonstrates ability to empty both bags. Pt and Pt's Wife verbalized understanding of all discharge teaching/Instructions. Discharge Packet with hard scripts with Pt at time of Discharge. VSS and no acute changes noted with Pt's assessment at time of discharge

## 2018-02-26 NOTE — Progress Notes (Signed)
IP PROGRESS NOTE  Subjective:   Ronald Johnson appears well.  His wife is at the bedside.  No complaint.  His wife is concerned that he has not been out of bed.    Objective: Vital signs in last 24 hours: Blood pressure (!) 148/72, pulse 77, temperature (!) 97.5 F (36.4 C), temperature source Oral, resp. rate 16, height 6\' 4"  (1.93 m), weight 208 lb 1.8 oz (94.4 kg), SpO2 93 %.  Intake/Output from previous day: 07/10 0701 - 07/11 0700 In: 2567.3 [P.O.:510; I.V.:1930; IV Piggyback:127.3] Out: 2725 [Urine:2725]  Physical Exam:  HEENT: No thrush or ulcers Lungs: Inspiratory rhonchi at the right greater than left chest with decreased breath sounds of the lower chest bilaterally, no respiratory distress Cardiac: Regular rate and rhythm Abdomen: No hepatomegaly, nontender Extremities: No leg edema   Portacath/PICC-without erythema  Lab Results: Recent Labs    02/25/18 1046 02/26/18 0414  WBC 2.6* 2.9*  HGB 9.9* 9.4*  HCT 29.5* 28.2*  PLT 38* 36*    BMET Recent Labs    02/25/18 1046 02/26/18 0414  NA 138 138  K 4.0 3.5  CL 104 104  CO2 27 27  GLUCOSE 371* 267*  BUN 19 14  CREATININE 0.99 0.85  CALCIUM 8.3* 8.4*    Lab Results  Component Value Date   CEA1 22.82 (H) 02/16/2018    Studies/Results: Ct Angio Chest Pe W/cm &/or Wo Cm  Result Date: 02/24/2018 CLINICAL DATA:  Shortness of breath. History of esophageal and thyroid cancer. EXAM: CT ANGIOGRAPHY CHEST WITH CONTRAST TECHNIQUE: Multidetector CT imaging of the chest was performed using the standard protocol during bolus administration of intravenous contrast. Multiplanar CT image reconstructions and MIPs were obtained to evaluate the vascular anatomy. CONTRAST:  110mL ISOVUE-370 IOPAMIDOL (ISOVUE-370) INJECTION 76% COMPARISON:  Radiograph of same day.  CT scan of Dec 23, 2017. FINDINGS: Cardiovascular: Small filling defects seen into lower lobe branches of the left pulmonary artery concerning for small peripheral  pulmonary emboli. Atherosclerosis of thoracic aorta is noted without aneurysm or dissection. Left-sided Port-A-Cath is again noted. Probable coronary artery calcifications are noted. No pericardial effusion is noted. Normal cardiac size. Mediastinum/Nodes: Status post gastric pull-through procedure for treatment of esophageal cancer. Continued presence of abnormal soft tissue density in the right thoracic inlet which is grossly unchanged in size or appearance compared to prior exam. This surrounds the proximal right subclavian and carotid arteries. This mass also extends to the right thyroid lobe. No definite adenopathy is noted. Lungs/Pleura: Interval development of large loculated right pleural effusion is noted. Adjacent subsegmental atelectasis is noted in the right lower lobe. 9 mm nodule is noted in right lower lobe which was present on prior exam. No pneumothorax is noted. Bulla formation is noted in both lung apices. Stable 17 x 8 mm nodular density is noted laterally in left lower lobe. Upper Abdomen: No acute abnormality. Musculoskeletal: Stable sclerotic lesion is seen in T1 vertebral body consistent with metastatic disease. No other significant osseous abnormality is noted. Review of the MIP images confirms the above findings. IMPRESSION: Small filling defects are seen in 2 lower lobe branches of the left pulmonary artery concerning for small peripheral pulmonary emboli. Status post gastric pull-through procedure for treatment of esophageal cancer. Stable abnormal soft tissue density seen in right thoracic inlet concerning for malignancy. Interval development of large loculated right pleural effusion with adjacent subsegmental atelectasis of right lower lobe. Stable bilateral pulmonary nodules are noted as described above. Stable sclerotic lesion seen in  T1 vertebral body most consistent with metastatic disease. Aortic Atherosclerosis (ICD10-I70.0). Electronically Signed   By: Marijo Conception, M.D.   On:  02/24/2018 13:17   Dg Chest Port 1 View  Result Date: 02/24/2018 CLINICAL DATA:  Esophageal malignancy with most recent chemotherapy last week. Patient is febrile with shortness of breath and decreased oxygen saturation. EXAM: PORTABLE CHEST 1 VIEW COMPARISON:  CT scan of the chest of Dec 23, 2017 FINDINGS: The lungs are well-expanded. There is increased interstitial density on the right consistent with posterior layering of a moderate-sized pleural effusion. The pulmonary interstitium is also more conspicuous on the right than on the previous study. The left lung is well-expanded and clear. The heart and pulmonary vascularity are normal. The power port catheter tip projects over the distal third of the SVC. IMPRESSION: Interval further accumulation of pleural fluid on the right which is layering posteriorly. Mildly increased interstitial markings on the right as compared to the left may reflect asymmetric pulmonary edema of cardiac or noncardiac cause. It could also reflect lymphatic obstruction. Repeat chest CT scanning is recommended. Thoracic aortic atherosclerosis. Electronically Signed   By: David  Martinique M.D.   On: 02/24/2018 12:33    Medications: I have reviewed the patient's current medications.  Assessment/Plan:  1. 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 EUS 03/16/2015 confirmed a clinical stage IIb (uT3,uN1) tumor ? Staging CTs of the chest, abdomen, and pelvis with no evidence of metastatic disease ? Initiation of radiation 04/19/2015, cycle 1 Taxol/carboplatin 04/20/2015: Radiation completed 05/29/2015; the fifth and final week of Taxol/carboplatin 05/18/2015 ? Restaging CTs 07/20/2015 revealed no evidence of metastatic disease, incidental left lower lobe pulmonary embolism ? Transhiatal total esophagectomy 07/25/2015 confirmed a ypT3, ypN2 with 4/5 positive lymph nodes, lymphovascular invasion, perineural invasion, negative resection  margins ? PD1 score-0 ? CT neck 07/25/2017-4-5 cm mass in the right lower neck upper mediastinum immediately adjacent to the esophageal anastomosis. 12 mm mass within the right parotid enlarged since the previous PET study where it was about half that size. Question few small pulmonary nodules. Borderline enlarged mediastinal lymph nodes. ? CTs brain/chest/abdomen/pelvis 08/06/2017-ill-defined mass at the right thoracic inlet extending into the right superior mediastinum; new bilateral but right much greater than left peripheral reticulonodular and groundglass opacities; scattered small paratracheal and anterior mediastinal lymph nodes; small bilateral axillary lymph nodes. Brain CT with no evidence of metastasis. ? 08/26/2017 biopsy right supraclavicular lymph node-very scant fragment of benign lymph node. ? PET scan 09/11/2017-poorly marginated heterogeneously hypermetabolic soft tissue mass at the right thoracic inlet extending laterally from the esophagogastric anastomosis in the neck. No definite hypermetabolic metastatic disease.Patchy tree-in-bud opacities throughout the lungs significantly decreased. Persistent scattered subcentimeter solid pulmonary nodules in both lungs stable to decreased. Stable irregular 1.7 cm nodular bandlike opacity anterior left lower lobe with associated low-level metabolism. Nonspecific mildly hypermetabolic subcarinal and bilateral hilar lymph nodes. Nonspecific mildly hypermetabolic nodular focus of soft tissue in the upper left retroperitoneum anterior to the adrenal gland. ? Upper endoscopy 09/15/2017-at 20 cm from the incisors tight stricture at the anastomosis measuring only a few millimeters in diameter. Surrounding tissues firm, slightly friable, noncompliant. Stricture was dilated with a 15 mm balloon several times. Diameter of stricture improved but still would not permit passage of a standard upper endoscope. Friable areas biopsied-invasive  adenocarcinoma. ? Initiation of radiation and concurrent capecitabine 2/4/2019completed 10/30/2017 ? CTs neck and chest 12/23/2017-stable abnormal soft tissue at the right thoracic inlet with encasement of the  proximal right common carotid artery, interval development of sclerotic lesions at C7 and T1, new indeterminate right lung opacities ? Cycle 1 FOLFOX 01/08/2018 ? Cycle 2 FOLFOX 02/02/2018 (Oxaliplatin dose reduced due to neutropenia) ? Cycle 3 FOLFOX 02/16/2018, white cell growth factor support added ? CT 02/24/2018- progressive loculated right effusion, stable lung nodules, stable thoracic inlet soft tissue, small peripheral pulmonary emboli  2. Postprandial subxiphoid pain secondary to #1  3. Diabetes-elevated blood sugar readings at the [facility] and at home  4. COPD  5. Depression-Improved with Wellbutrin  6. Hypertension  7. History of adenomatous colon polyps  8. History of diabetic related neuropathy, peripheral arterial disease, and nephropathy  9. History of nausea-likely related to radiation esophagitis/gastritis  10. Incidental pulmonary embolism noted on the CT 07/20/2015, treated with Xarelto  11. Admission 08/18/2015 with GI bleeding while on Xarelto, anticoagulation therapy discontinued  12. Dysphagia-improved with esophageal dilatation procedures by Dr. Henrene Pastor, progressive dysphagia secondary to local recurrence of esophagus cancer with obstruction at the esophagus anastomosis  13. Pancytopenia secondary to chemotherapy  14. CT scan abdomen/pelvis 10/09/2016 (done to evaluate persistent nausea, epigastric pain)-no acute findings or explanation for the patient's symptoms. Resolving postsurgical changes from presumed previous esophagectomy and gastric pull-through. No evidence of metastatic disease.  15. Admission 02/24/2018 with sepsis syndrome- probable pulmonary source for infection with a right effusion cultures negative to  date  16.  Incidental pulmonary emboli noted on CT 02/24/2018   Ronald Johnson appears well.  The sepsis syndrome has resolved.  Cultures are negative to date.  No apparent source for infection other than the right pleural effusion.  He has stable thrombocytopenia.  The neutrophil count has recovered.  Mr. Becvar was noted to have incidental pulmonary emboli on the CT chest 02/24/2018.  He was also noted to have incidental pulmonary emboli on the preoperative chest CT in 2016.  I will review the current and remote chest CTs in radiology to see if there is clear evidence of new pulmonary emboli.  I will discuss the indication for anticoagulation therapy with the medical service.  There is no clinical evidence of a deep vein thrombosis.  Recommendations: 1.  Complete course of antibiotics, follow-up cultures 2.  Consider anticoagulation therapy after further review of chest CT and platelet recovery 3.  Increase relation, physical therapy 4.  Outpatient follow-up as scheduled at the Cancer center 03/02/2018      LOS: 2 days   Betsy Coder, MD   02/26/2018, 8:05 AM

## 2018-02-26 NOTE — Progress Notes (Signed)
  Echocardiogram 2D Echocardiogram has been performed.  Ronald Johnson T Annya Lizana 02/26/2018, 2:28 PM

## 2018-02-27 ENCOUNTER — Telehealth: Payer: Self-pay | Admitting: Pulmonary Disease

## 2018-02-27 DIAGNOSIS — R6521 Severe sepsis with septic shock: Secondary | ICD-10-CM

## 2018-02-27 DIAGNOSIS — C160 Malignant neoplasm of cardia: Secondary | ICD-10-CM

## 2018-02-27 DIAGNOSIS — A419 Sepsis, unspecified organism: Secondary | ICD-10-CM

## 2018-02-27 LAB — CBC WITH DIFFERENTIAL/PLATELET
BASOS ABS: 0 10*3/uL (ref 0.0–0.1)
Basophils Relative: 0 %
EOS PCT: 2 %
Eosinophils Absolute: 0.1 10*3/uL (ref 0.0–0.7)
HEMATOCRIT: 30.8 % — AB (ref 39.0–52.0)
Hemoglobin: 10.3 g/dL — ABNORMAL LOW (ref 13.0–17.0)
LYMPHS ABS: 0.9 10*3/uL (ref 0.7–4.0)
Lymphocytes Relative: 15 %
MCH: 33.3 pg (ref 26.0–34.0)
MCHC: 33.4 g/dL (ref 30.0–36.0)
MCV: 99.7 fL (ref 78.0–100.0)
MONO ABS: 0.8 10*3/uL (ref 0.1–1.0)
Monocytes Relative: 13 %
Neutro Abs: 4.4 10*3/uL (ref 1.7–7.7)
Neutrophils Relative %: 70 %
Platelets: 47 10*3/uL — ABNORMAL LOW (ref 150–400)
RBC: 3.09 MIL/uL — AB (ref 4.22–5.81)
RDW: 14.1 % (ref 11.5–15.5)
WBC: 6.2 10*3/uL (ref 4.0–10.5)

## 2018-02-27 LAB — MAGNESIUM: MAGNESIUM: 2 mg/dL (ref 1.7–2.4)

## 2018-02-27 LAB — URINE CULTURE: Culture: NO GROWTH

## 2018-02-27 LAB — BASIC METABOLIC PANEL
Anion gap: 6 (ref 5–15)
BUN: 10 mg/dL (ref 8–23)
CALCIUM: 8.5 mg/dL — AB (ref 8.9–10.3)
CO2: 29 mmol/L (ref 22–32)
CREATININE: 0.72 mg/dL (ref 0.61–1.24)
Chloride: 106 mmol/L (ref 98–111)
GFR calc non Af Amer: >60 mL/min (ref >60)
Glucose, Bld: 165 mg/dL — ABNORMAL HIGH (ref 70–99)
Potassium: 3.3 mmol/L — ABNORMAL LOW (ref 3.5–5.1)
Sodium: 141 mmol/L (ref 135–145)

## 2018-02-27 LAB — GLUCOSE, CAPILLARY
Glucose-Capillary: 164 mg/dL — ABNORMAL HIGH (ref 70–99)
Glucose-Capillary: 229 mg/dL — ABNORMAL HIGH (ref 70–99)

## 2018-02-27 MED ORDER — AMOXICILLIN-POT CLAVULANATE 875-125 MG PO TABS: 1.0000 | ORAL_TABLET | Freq: Two times a day (BID) | ORAL | Status: DC

## 2018-02-27 MED ORDER — HEPARIN SOD (PORK) LOCK FLUSH 100 UNIT/ML IV SOLN
500.0000 [IU] | INTRAVENOUS | Status: AC | PRN
  Administered 2018-02-27: 500 [IU]

## 2018-02-27 MED ORDER — POTASSIUM CHLORIDE CRYS ER 20 MEQ PO TBCR
40.0000 meq | EXTENDED_RELEASE_TABLET | Freq: Once | ORAL | Status: AC
  Administered 2018-02-27: 40 meq via ORAL
  Filled 2018-02-27: qty 2

## 2018-02-27 MED ORDER — AMOXICILLIN-POT CLAVULANATE 875-125 MG PO TABS: 1.0000 | ORAL_TABLET | Freq: Two times a day (BID) | ORAL | 0 refills | Status: AC

## 2018-02-27 NOTE — Progress Notes (Signed)
Physical Therapy Treatment Patient Details Name: Ronald Johnson MRN: [id number] DOB: 07-06-1946 Today's Date: 02/27/2018    History of Present Illness 72 y/o M with PMH of esophageal cancer s/p esophageal resection who presented to [facility] ER on 7/9 with reports of fever and shortness of breath. progressive loculated right effusion, stable lung nodules, stable thoracic inlet soft tissue, small peripheral pulmonary emboli    PT Comments    Pt assisted with ambulating in hallway and requiring min assist for steadying at this time.  Spouse present and reports they (son too) can assist pt at home.  Provided gait belt for safety at home upon d/c.  Pt and spouse agreeable to outpatient PT.  Also recommended another person assist for mobility at home initially for safety.  SATURATION QUALIFICATIONS: (This note is used to comply with regulatory documentation for home oxygen)  Patient Saturations on Room Air at Rest = 90%  Patient Saturations on Room Air while Ambulating = 87%  Patient Saturations on 2 Liters of oxygen while Ambulating = 93-96%  Please briefly explain why patient needs home oxygen: to improve oxygen saturations with physical activity such as ambulation.   Follow Up Recommendations  Outpatient PT     Equipment Recommendations  Rolling walker with 5" wheels    Recommendations for Other Services       Precautions / Restrictions Precautions Precautions: Fall Precaution Comments: monitor sats    Mobility  Bed Mobility Overal bed mobility: Needs Assistance Bed Mobility: Supine to Sit     Supine to sit: Supervision;HOB elevated        Transfers Overall transfer level: Needs assistance Equipment used: Rolling walker (2 wheeled) Transfers: Sit to/from Stand Sit to Stand: Min assist         General transfer comment: increased effort and time, slight assist to rise, posterior bias  Ambulation/Gait Ambulation/Gait assistance: Min assist Gait Distance (Feet): 120  Feet Assistive device: Rolling walker (2 wheeled) Gait Pattern/deviations: Step-through pattern;Decreased stride length     General Gait Details: verbal cues for using RW for steadying, improved with distance, required supplemental oxygen   Stairs             Wheelchair Mobility    Modified Rankin (Stroke Patients Only)       Balance Overall balance assessment: Mild deficits observed, not formally tested;Needs assistance         Standing balance support: Bilateral upper extremity supported Standing balance-Leahy Scale: Poor                              Cognition Arousal/Alertness: Awake/alert Behavior During Therapy: WFL for tasks assessed/performed Overall Cognitive Status: Within Functional Limits for tasks assessed                                        Exercises      General Comments        Pertinent Vitals/Pain Pain Assessment: No/denies pain    Home Living                      Prior Function            PT Goals (current goals can now be found in the care plan section) Progress towards PT goals: Progressing toward goals    Frequency    Min 3X/week  PT Plan Current plan remains appropriate    Co-evaluation              AM-PAC PT "6 Clicks" Daily Activity  Outcome Measure  Difficulty turning over in bed (including adjusting bedclothes, sheets and blankets)?: A Little Difficulty moving from lying on back to sitting on the side of the bed? : A Lot Difficulty sitting down on and standing up from a chair with arms (e.g., wheelchair, bedside commode, etc,.)?: Unable Help needed moving to and from a bed to chair (including a wheelchair)?: A Little Help needed walking in hospital room?: A Little Help needed climbing 3-5 steps with a railing? : A Little 6 Click Score: 15    End of Session Equipment Utilized During Treatment: Gait belt;Oxygen Activity Tolerance: Patient tolerated treatment  well Patient left: with call bell/phone within reach;in chair;with chair alarm set;with family/visitor present Nurse Communication: Mobility status PT Visit Diagnosis: Other abnormalities of gait and mobility (R26.89);Unsteadiness on feet (R26.81)     Time: 0881-1031 PT Time Calculation (min) (ACUTE ONLY): 24 min  Charges:  $Gait Training: 23-37 mins                    G Codes:      Carmelia Bake, PT, DPT 02/27/2018 Pager: [phone]  York Ram E 02/27/2018, 12:31 PM

## 2018-02-27 NOTE — Telephone Encounter (Signed)
Called patient unable to reach left message to give us a call back.

## 2018-02-27 NOTE — Care Management Important Message (Signed)
Important Message  Patient Details  Name: Ronald Johnson MRN: [id number] Date of Birth: [birth year]   Medicare Important Message Given:  Yes    Kerin Salen 02/27/2018, 12:04 [place] Message  Patient Details  Name: Ronald Johnson MRN: [id number] Date of Birth: February 08, 1946   Medicare Important Message Given:  Yes    Kerin Salen 02/27/2018, 12:04 PM

## 2018-02-27 NOTE — Progress Notes (Signed)
IP PROGRESS NOTE  Subjective:   Ronald Johnson has no complaint.  He was able to ambulate with physical therapy. Objective: Vital signs in last 24 hours: Blood pressure (!) 155/78, pulse 85, temperature 97.7 F (36.5 C), temperature source Oral, resp. rate (!) 22, height 6\' 4"  (1.93 m), weight 208 lb 1.8 oz (94.4 kg), SpO2 93 %.  Intake/Output from previous day: 07/11 0701 - 07/12 0700 In: 2435.5 [P.O.:480; I.V.:1706.6; IV Piggyback:249] Out: 1900 [Urine:1900]  Physical Exam: Not performed today   Portacath/PICC-without erythema  Lab Results: Recent Labs    02/26/18 0414 02/27/18 0504  WBC 2.9* 6.2  HGB 9.4* 10.3*  HCT 28.2* 30.8*  PLT 36* 47*  ANC 4.4  BMET Recent Labs    02/26/18 0414 02/27/18 0504  NA 138 141  K 3.5 3.3*  CL 104 106  CO2 27 29  GLUCOSE 267* 165*  BUN 14 10  CREATININE 0.85 0.72  CALCIUM 8.4* 8.5*    Lab Results  Component Value Date   CEA1 22.82 (H) 02/16/2018    Medications: I have reviewed the patient's current medications.  Assessment/Plan:  1. 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 EUS 03/16/2015 confirmed a clinical stage IIb (uT3,uN1) tumor ? Staging CTs of the chest, abdomen, and pelvis with no evidence of metastatic disease ? Initiation of radiation 04/19/2015, cycle 1 Taxol/carboplatin 04/20/2015: Radiation completed 05/29/2015; the fifth and final week of Taxol/carboplatin 05/18/2015 ? Restaging CTs 07/20/2015 revealed no evidence of metastatic disease, incidental left lower lobe pulmonary embolism ? Transhiatal total esophagectomy 07/25/2015 confirmed a ypT3, ypN2 with 4/5 positive lymph nodes, lymphovascular invasion, perineural invasion, negative resection margins ? PD1 score-0 ? CT neck 07/25/2017-4-5 cm mass in the right lower neck upper mediastinum immediately adjacent to the esophageal anastomosis. 12 mm mass within the right parotid enlarged since the previous PET study where it  was about half that size. Question few small pulmonary nodules. Borderline enlarged mediastinal lymph nodes. ? CTs brain/chest/abdomen/pelvis 08/06/2017-ill-defined mass at the right thoracic inlet extending into the right superior mediastinum; new bilateral but right much greater than left peripheral reticulonodular and groundglass opacities; scattered small paratracheal and anterior mediastinal lymph nodes; small bilateral axillary lymph nodes. Brain CT with no evidence of metastasis. ? 08/26/2017 biopsy right supraclavicular lymph node-very scant fragment of benign lymph node. ? PET scan 09/11/2017-poorly marginated heterogeneously hypermetabolic soft tissue mass at the right thoracic inlet extending laterally from the esophagogastric anastomosis in the neck. No definite hypermetabolic metastatic disease.Patchy tree-in-bud opacities throughout the lungs significantly decreased. Persistent scattered subcentimeter solid pulmonary nodules in both lungs stable to decreased. Stable irregular 1.7 cm nodular bandlike opacity anterior left lower lobe with associated low-level metabolism. Nonspecific mildly hypermetabolic subcarinal and bilateral hilar lymph nodes. Nonspecific mildly hypermetabolic nodular focus of soft tissue in the upper left retroperitoneum anterior to the adrenal gland. ? Upper endoscopy 09/15/2017-at 20 cm from the incisors tight stricture at the anastomosis measuring only a few millimeters in diameter. Surrounding tissues firm, slightly friable, noncompliant. Stricture was dilated with a 15 mm balloon several times. Diameter of stricture improved but still would not permit passage of a standard upper endoscope. Friable areas biopsied-invasive adenocarcinoma. ? Initiation of radiation and concurrent capecitabine 2/4/2019completed 10/30/2017 ? CTs neck and chest 12/23/2017-stable abnormal soft tissue at the right thoracic inlet with encasement of the proximal right common carotid  artery, interval development of sclerotic lesions at C7 and T1, new indeterminate right lung opacities ? Cycle 1 FOLFOX 01/08/2018 ? Cycle  2 FOLFOX 02/02/2018 (Oxaliplatin dose reduced due to neutropenia) ? Cycle 3 FOLFOX 02/16/2018, white cell growth factor support added ? CT 02/24/2018- progressive loculated right effusion, stable lung nodules, stable thoracic inlet soft tissue, small peripheral pulmonary emboli  2. Postprandial subxiphoid pain secondary to #1  3. Diabetes-elevated blood sugar readings at the [facility] and at home  4. COPD  5. Depression-Improved with Wellbutrin  6. Hypertension  7. History of adenomatous colon polyps  8. History of diabetic related neuropathy, peripheral arterial disease, and nephropathy  9. History of nausea-likely related to radiation esophagitis/gastritis  10. Incidental pulmonary embolism noted on the CT 07/20/2015, treated with Xarelto  11. Admission 08/18/2015 with GI bleeding while on Xarelto, anticoagulation therapy discontinued  12. Dysphagia-improved with esophageal dilatation procedures by Dr. Henrene Pastor, progressive dysphagia secondary to local recurrence of esophagus cancer with obstruction at the esophagus anastomosis  13. Pancytopenia secondary to chemotherapy  14. CT scan abdomen/pelvis 10/09/2016 (done to evaluate persistent nausea, epigastric pain)-no acute findings or explanation for the patient's symptoms. Resolving postsurgical changes from presumed previous esophagectomy and gastric pull-through. No evidence of metastatic disease.  15. Admission 02/24/2018 with sepsis syndrome- probable pulmonary source for infection with a right effusion cultures negative to date  16.  Incidental pulmonary emboli noted on CT 02/24/2018   Ronald Johnson appears well.  He is afebrile and cultures remain negative.  I reviewed the admission chest CT images.  There is evidence of pulmonary emboli and peripheral left lower  lobe arteries.  These changes were not present on the CT from May and are in a different distribution than the pulmonary embolism seen on a CT in 2016.  I recommend beginning anticoagulation therapy when the platelet count recovers.  The cycle of FOLFOX chemotherapy scheduled for 03/02/2018 will be delayed.  We will decide  Recommendations: 1.  Complete course of Augmentin 2.  Consider anticoagulation therapy to begin when the platelet count recovers, I will prescribe Xarelto 3.  Follow-up as scheduled at the Cancer center 03/02/2018      LOS: 3 days   Betsy Coder, MD   02/27/2018, 12:20 PM

## 2018-02-27 NOTE — Discharge Summary (Signed)
Physician Discharge Summary  DRAVEN NATTER GYJ:[id number] DOB: 1945-12-12 DOA: 02/24/2018  PCP: [name], MD  Admit date: 02/24/2018 Discharge date: 02/27/2018  Time spent: 65 minutes  Recommendations for Outpatient Follow-up:  1. Follow-up with [place] [facility] for loculated pleural effusion.  Office will call with appointment time. 2. Follow-up with Dr. [name], oncology as scheduled on 03/02/2018. 3. Follow-up with [name], MD in 2 weeks. 4. Follow-up with outpatient PT.   Discharge Diagnoses:  Principal Problem:   Severe sepsis with septic shock [facility]) Active Problems:   Pleural effusion on right   Insulin dependent diabetes mellitus ([place])   Pancytopenia ([place])   Septic shock ([place])   Discharge Condition: Stable and improved.  Diet recommendation: Carb modified diet.  Filed Weights   02/24/18 1104 02/24/18 1730  Weight: 91.6 kg (202 lb) 94.4 kg (208 lb 1.8 oz)    History of present illness:  Dr Nelda Marseille 72 y/o M with PMH of esophageal cancer s/p esophageal resection who presented to [facility] ER on 7/9 with reports of fever and shortness of breath.    The patient's wife provides information as patient is unable.  She stated he has been receiving chemotherapy and has been feeling weak, sleeping more and having shortness of breath.  in addition, he has had a productive cough.  The patient woke am of presentation and his wife reported he was weak.  She called EMS and they "checked him out" and his vitals were okay.  Approximately one hour later she noted he was shaking and she checked his temperature which was up to 103.  He was transported to the ER and found to be hypotensive.  He received 4L of LR, empiric antibiotics.  Initial labs- Na 139, K 4.1, Sr 1.17, troponin 0.13, troponin 2.27, WBC 1.6, Hgb 11 and platelets 44.  He was started on levophed for hypotension.   PCCM called for ICU admission.     Hospital Course:  1 severe sepsis with septic shock Likely  secondary to loculated right pleural effusion concerning for infection.  Patient was hypotensive on admission given 4 L LR in the ED and placed on levophed.  Patient also placed on stress dose IV steroids.  Levophed was subsequently weaned off and blood pressure remained stable.  IV stress dose steroids were tapered down and subsequently discontinued.  Patient was pancultured results pending by day of discharge.  Patient placed empirically on IV Zosyn and followed.  Patient remained afebrile for over 48 hours during the hospitalization and subsequently transition to oral Augmentin to complete a 2-week course of antibiotic treatment.  Patient did not want any significant invasive procedures done and as such pleural effusion was not tapped.  Discussed with pulmonary as well as patient and family.  Patient was discharged home on 10 more days of oral Augmentin and is to follow-up with pulmonary in the outpatient setting.   2.  Loculated right pleural effusion Concerning for an infectious etiology.  Patient wanted conservative treatment and did not want any significant invasive procedures and as such loculated right pleural effusion was not tapped by PCCM.  Patient was placed empirically on IV antibiotics and monitored.  Patient remained afebrile.  Patient was subsequently transitioned from IV Zosyn to oral Augmentin on day of discharge and will be discharged home on 10 more days of oral Augmentin to complete a two-week course of antibiotic treatment.  Patient noted to have increased O2 requirements and as such will be discharged home on home O2.  Outpatient follow-up with pulmonary.    3.  Bradycardia Stable.  Follow.  4.  Tobacco abuse Quit in 2010.  5.  Hypertension Patient had presented hypotensive on admission.    Patient placed on stress dose steroids with improvement with blood pressure.  IV steroids subsequently tapered off.  Blood pressure remained stable.  Outpatient follow-up.  6.   Gastroesophageal reflux disease Patient maintained on a PPI as well as sucralfate during the hospitalization.    #7 GE junction carcinoma/adenocarcinoma Per hematology/oncology.  Outpatient follow-up as scheduled.  8.  Insulin-dependent diabetes mellitus Patient noted to have elevated CBGs during the hospitalization however patient was initially placed on stress dose IV steroids.  Patient was started on Lantus and sliding scale insulin with better blood glucose control.  IV stress dose steroids were tapered down and subsequently discontinued by day of discharge.  Patient be discharged home back on his home regimen of insulin.  Outpatient follow-up with PCP.   9.  Incidental pulmonary embolism noted on CT 07/20/2015 status post treatment with Xarelto.  10.  Dysphagia Improved with esophageal dilatation procedures per GI, Dr. Henrene Pastor.  Progressive dysphagia secondary to local recurrence of esophageal cancer with obstruction at the esophageal anastomosis.  Continue current diet.  11 incidental pulmonary emboli noted on CT 02/24/2018 Hematology/oncology to reviewed CT scans.  Per hematology oncology consider anticoagulation therapy after review of CT scans and improvement of platelet count.  Outpatient follow-up.  12.  Debility Patient seen by PT/OT.Outpatient PT.  13.  Elevated troponin Likely secondary to demand ischemia.  Repeat troponins had decreased and flattened. .  2D echo with mild LVH, EF 55-60%. Patient remained asymptomatic.  14.  Pancytopenia Secondary to chemotherapy.  No bleeding noted. Outpatient follow up with oncology.       Procedures:  2D echo 02/26/2018   CT angiogram chest 02/24/2018  Chest x-ray 02/24/2018   CULTURES: BCx2 7/9 >> UC 7/9 >> MRSA screen negative   Consultations:  PCCM admission  Oncology: Dr. [name]      Discharge Exam: Vitals:   02/26/18 2134 02/27/18 0627  BP: 126/63 (!) 155/78  Pulse: 70 85  Resp: 16 (!) 22  Temp:  (!) 97.5 F (36.4 C) 97.7 F (36.5 C)  SpO2: 96% 93%    General: NAD Cardiovascular: RRR Respiratory: Decreased breath sounds right base.  Discharge Instructions   Discharge Instructions    Ambulatory referral to Occupational Therapy   Complete by:  As directed    Ambulatory referral to Physical Therapy   Complete by:  As directed    Diet Carb Modified   Complete by:  As directed    Increase activity slowly   Complete by:  As directed      Allergies as of 02/27/2018   No Known Allergies     Medication List    TAKE these medications   albuterol 108 (90 Base) MCG/ACT inhaler Commonly known as:  PROVENTIL HFA;VENTOLIN HFA Inhale 2 puffs into the lungs every 6 (six) hours as needed for wheezing or shortness of breath.   amoxicillin-clavulanate 875-125 MG tablet Commonly known as:  AUGMENTIN Take 1 tablet by mouth every 12 (twelve) hours for 10 days.   diazepam 5 MG tablet Commonly known as:  VALIUM Take 5 mg by mouth every 6 (six) hours as needed for anxiety.   escitalopram 20 MG tablet Commonly known as:  LEXAPRO Take 1 tablet (20 mg total) by mouth daily.   glucagon 1 MG injection Commonly known as:  GLUCAGON EMERGENCY Inject 1 mg into the vein once as needed for up to 1 dose. What changed:  reasons to take this   glucose blood test strip Commonly known as:  PRECISION XTRA TEST STRIPS Check blood sugar three times a day dx 250.01   insulin NPH-regular Human (70-30) 100 UNIT/ML injection Commonly known as:  HUMULIN 70/30 Inject 70 Units into the skin daily with breakfast. What changed:    how much to take  additional instructions   Insulin Syringe-Needle U-100 30G X 3/8" 1 ML Misc Use to inject insulin 3 times daily.   ketotifen 0.025 % ophthalmic solution Commonly known as:  ZADITOR Place 1 drop into both eyes 2 (two) times daily as needed (pain).   lidocaine-prilocaine cream Commonly known as:  EMLA Apply to Port-A-Cath site 1 hour prior to  use   oxyCODONE 5 MG immediate release tablet Commonly known as:  Oxy IR/ROXICODONE Take 1 tablet (5 mg total) by mouth daily as needed for severe pain.   prochlorperazine 5 MG tablet Commonly known as:  COMPAZINE Take 1 tablet (5 mg total) by mouth every 6 (six) hours as needed for nausea or vomiting.   RABEprazole 20 MG tablet Commonly known as:  ACIPHEX TAKE 1 TABLET(20 MG) BY MOUTH TWICE DAILY   sildenafil 20 MG tablet Commonly known as:  REVATIO 1-5 tabs as needed for ED symptoms   sucralfate 1 g tablet Commonly known as:  CARAFATE Take 1 tablet (1 g total) by mouth 4 (four) times daily -  with meals and at bedtime. Crush and mix in 1 oz of water to take What changed:    when to take this  reasons to take this  additional instructions   topiramate 50 MG tablet Commonly known as:  TOPAMAX 2 in the AM, 1 at night What changed:    how much to take  how to take this  when to take this  additional instructions            Durable Medical Equipment  (From admission, onward)        Start     Ordered   02/27/18 1148  For home use only DME Walker rolling  Once    Question:  Patient needs a walker to treat with the following condition  Answer:  Fear for personal safety   02/27/18 1147   02/27/18 1043  For home use only DME oxygen  Once    Question Answer Comment  Mode or (Route) Nasal cannula   Liters per Minute 2   Frequency Continuous (stationary and portable oxygen unit needed)   Oxygen conserving device Yes   Oxygen delivery system Gas      02/27/18 1042     No Known Allergies Follow-up Information    [name], MD. Schedule an appointment as soon as possible for a visit in 2 week(s).   Specialty:  Endocrinology Contact information: [address] [place] [address] [phone]        Ladell Pier, MD Follow up.   Specialty:  Oncology Why:  f/u as scheduled. Contact information: [place] [address] [phone]        [facility] Follow up.   Specialty:  Pulmonology Why:  office will call with f/u appointment time. Contact information: [place] [phone]           The results of significant diagnostics from this hospitalization (  including imaging, microbiology, ancillary and laboratory) are listed below for reference.    Significant Diagnostic Studies: Ct Angio Chest Pe W/cm &/or Wo Cm  Result Date: 02/24/2018 CLINICAL DATA:  Shortness of breath. History of esophageal and thyroid cancer. EXAM: CT ANGIOGRAPHY CHEST WITH CONTRAST TECHNIQUE: Multidetector CT imaging of the chest was performed using the standard protocol during bolus administration of intravenous contrast. Multiplanar CT image reconstructions and MIPs were obtained to evaluate the vascular anatomy. CONTRAST:  168mL ISOVUE-370 IOPAMIDOL (ISOVUE-370) INJECTION 76% COMPARISON:  Radiograph of same day.  CT scan of Dec 23, 2017. FINDINGS: Cardiovascular: Small filling defects seen into lower lobe branches of the left pulmonary artery concerning for small peripheral pulmonary emboli. Atherosclerosis of thoracic aorta is noted without aneurysm or dissection. Left-sided Port-A-Cath is again noted. Probable coronary artery calcifications are noted. No pericardial effusion is noted. Normal cardiac size. Mediastinum/Nodes: Status post gastric pull-through procedure for treatment of esophageal cancer. Continued presence of abnormal soft tissue density in the right thoracic inlet which is grossly unchanged in size or appearance compared to prior exam. This surrounds the proximal right subclavian and carotid arteries. This mass also extends to the right thyroid lobe. No definite adenopathy is noted. Lungs/Pleura: Interval development of large loculated right pleural effusion is noted. Adjacent subsegmental atelectasis is noted in the right lower lobe. 9 mm  nodule is noted in right lower lobe which was present on prior exam. No pneumothorax is noted. Bulla formation is noted in both lung apices. Stable 17 x 8 mm nodular density is noted laterally in left lower lobe. Upper Abdomen: No acute abnormality. Musculoskeletal: Stable sclerotic lesion is seen in T1 vertebral body consistent with metastatic disease. No other significant osseous abnormality is noted. Review of the MIP images confirms the above findings. IMPRESSION: Small filling defects are seen in 2 lower lobe branches of the left pulmonary artery concerning for small peripheral pulmonary emboli. Status post gastric pull-through procedure for treatment of esophageal cancer. Stable abnormal soft tissue density seen in right thoracic inlet concerning for malignancy. Interval development of large loculated right pleural effusion with adjacent subsegmental atelectasis of right lower lobe. Stable bilateral pulmonary nodules are noted as described above. Stable sclerotic lesion seen in T1 vertebral body most consistent with metastatic disease. Aortic Atherosclerosis (ICD10-I70.0). Electronically Signed   By: Marijo Conception, M.D.   On: 02/24/2018 13:17   Dg Chest Port 1 View  Result Date: 02/24/2018 CLINICAL DATA:  Esophageal malignancy with most recent chemotherapy last week. Patient is febrile with shortness of breath and decreased oxygen saturation. EXAM: PORTABLE CHEST 1 VIEW COMPARISON:  CT scan of the chest of Dec 23, 2017 FINDINGS: The lungs are well-expanded. There is increased interstitial density on the right consistent with posterior layering of a moderate-sized pleural effusion. The pulmonary interstitium is also more conspicuous on the right than on the previous study. The left lung is well-expanded and clear. The heart and pulmonary vascularity are normal. The power port catheter tip projects over the distal third of the SVC. IMPRESSION: Interval further accumulation of pleural fluid on the right which  is layering posteriorly. Mildly increased interstitial markings on the right as compared to the left may reflect asymmetric pulmonary edema of cardiac or noncardiac cause. It could also reflect lymphatic obstruction. Repeat chest CT scanning is recommended. Thoracic aortic atherosclerosis. Electronically Signed   By: David  Martinique M.D.   On: 02/24/2018 12:33    Microbiology: Recent Results (from the past 240 hour(s))  Culture, blood (  Routine x 2)     Status: None (Preliminary result)   Collection Time: 02/24/18 11:06 AM  Result Value Ref Range Status   Specimen Description   Final    BLOOD PORTA CATH Performed at [facility] [address], [place], [place] [address]    Special Requests   Final    BOTTLES DRAWN AEROBIC AND ANAEROBIC BCAV Performed at [facility], [place] [address], [place], [place] [address]    Culture   Final    NO GROWTH 3 DAYS Performed at [facility], [place] [address] [place], [place] [address]    Report Status PENDING  Incomplete  Culture, blood (Routine x 2)     Status: None (Preliminary result)   Collection Time: 02/24/18 11:11 AM  Result Value Ref Range Status   Specimen Description   Final    BLOOD LEFT ANTECUBITAL Performed at [facility] [address], [place], [place] [address]    Special Requests   Final    BOTTLES DRAWN AEROBIC AND ANAEROBIC Blood Culture adequate volume Performed at [facility] [address] [place], [place] [address]    Culture   Final    NO GROWTH 3 DAYS Performed at [facility], [place] [address] [place], [place] [address]    Report Status PENDING  Incomplete  MRSA PCR Screening     Status: None   Collection Time: 02/24/18  6:08 PM  Result Value Ref Range Status   MRSA by PCR NEGATIVE NEGATIVE Final    Comment:        The GeneXpert MRSA Assay (FDA approved for NASAL specimens only), is one component of a comprehensive MRSA  colonization surveillance program. It is not intended to diagnose MRSA infection nor to guide or monitor treatment for MRSA infections. Performed at [facility], [place] [address], [place], [place] [address]   Urine culture     Status: None   Collection Time: 02/26/18  3:15 AM  Result Value Ref Range Status   Specimen Description   Final    URINE, RANDOM Performed at [facility] [address] [place], [place] [address]    Special Requests   Final    NONE Performed at [facility], [place] [address] [place], [place] [address]    Culture   Final    NO GROWTH Performed at [facility], [place] [address], [place], [place] [address]    Report Status 02/27/2018 FINAL  Final     Labs: Basic Metabolic Panel: Recent Labs  Lab 02/24/18 1106 02/24/18 1817 02/25/18 1046 02/26/18 0414 02/27/18 0504  NA 139 136 138 138 141  K 4.1 4.1 4.0 3.5 3.3*  CL 102 104 104 104 106  CO2 26 24 27 27 29   GLUCOSE 288* 261* 371* 267* 165*  BUN 21 18 19 14 10   CREATININE 1.17 0.95 0.99 0.85 0.72  CALCIUM 8.9 7.9* 8.3* 8.4* 8.5*  MG  --   --  1.7 1.7 2.0  PHOS  --   --  2.5 2.2*  --    Liver Function Tests: Recent Labs  Lab 02/24/18 1106 02/24/18 1817  AST 21 21  ALT 19 17  ALKPHOS 85 71  BILITOT 0.8 0.8  PROT 6.3* 5.3*  ALBUMIN 3.3* 2.7*   No results for input(s): LIPASE, AMYLASE in the last 168 hours. No results for input(s): AMMONIA in the last 168 hours.  CBC: Recent Labs  Lab 02/24/18 1106 02/24/18 1817 02/25/18 1046 02/26/18 0414 02/27/18 0504  WBC 1.6* 3.2* 2.6* 2.9* 6.2  NEUTROABS 1.0*  --   --  1.8 4.4  HGB 11.0* 10.0* 9.9* 9.4* 10.3*  HCT 33.2* 30.0* 29.5* 28.2* 30.8*  MCV 99.7 99.7 99.3 98.3 99.7  PLT 44* 39* 38* 36* 47*   Cardiac Enzymes: Recent Labs  Lab 02/24/18 1817 02/24/18 2355 02/26/18 0954 02/26/18 1439 02/26/18 2305  TROPONINI 1.28* 1.10* 0.30* 0.32* 0.36*   BNP: BNP (last 3  results) No results for input(s): BNP in the last 8760 hours.  ProBNP (last 3 results) No results for input(s): PROBNP in the last 8760 hours.  CBG: Recent Labs  Lab 02/26/18 1222 02/26/18 1642 02/26/18 2138 02/27/18 0813 02/27/18 1203  GLUCAP 317* 295* 215* 164* 229*       Signed:  Irine Seal MD.  [facility] 02/27/2018, 2:21 PM

## 2018-02-27 NOTE — Progress Notes (Signed)
Inpatient Diabetes Program Recommendations  AACE/ADA: New Consensus Statement on Inpatient Glycemic Control (2015)  Target Ranges:  Prepandial:   less than 140 mg/dL      Peak postprandial:   less than 180 mg/dL (1-2 hours)      Critically ill patients:  140 - 180 mg/dL   Lab Results  Component Value Date   GLUCAP 164 (H) 02/27/2018   HGBA1C 7.8 (A) 01/14/2018    Review of Glycemic Control  Post-prandial blood sugars elevated above goal of 180 mg/dL.  Inpatient Diabetes Program Recommendations:     Increase Novolog to 8 units tidwc.  Continue to follow.   Thank you. Lorenda Peck, RD, LDN, CDE Inpatient Diabetes Coordinator [phone]

## 2018-03-01 ENCOUNTER — Other Ambulatory Visit: Payer: Self-pay | Admitting: Oncology

## 2018-03-02 ENCOUNTER — Inpatient Hospital Stay: Payer: Medicare Other

## 2018-03-02 ENCOUNTER — Other Ambulatory Visit: Payer: Self-pay

## 2018-03-02 ENCOUNTER — Telehealth: Payer: Self-pay

## 2018-03-02 ENCOUNTER — Telehealth: Payer: Self-pay | Admitting: Oncology

## 2018-03-02 ENCOUNTER — Encounter: Payer: Self-pay | Admitting: Oncology

## 2018-03-02 ENCOUNTER — Inpatient Hospital Stay (HOSPITAL_BASED_OUTPATIENT_CLINIC_OR_DEPARTMENT_OTHER): Payer: Medicare Other | Admitting: Oncology

## 2018-03-02 VITALS — BP 107/69 | HR 90 | Temp 98.0°F | Resp 18 | Ht 76.0 in | Wt 200.9 lb

## 2018-03-02 DIAGNOSIS — T451X5S Adverse effect of antineoplastic and immunosuppressive drugs, sequela: Secondary | ICD-10-CM

## 2018-03-02 DIAGNOSIS — C159 Malignant neoplasm of esophagus, unspecified: Secondary | ICD-10-CM

## 2018-03-02 DIAGNOSIS — I2699 Other pulmonary embolism without acute cor pulmonale: Secondary | ICD-10-CM | POA: Diagnosis not present

## 2018-03-02 DIAGNOSIS — Z452 Encounter for adjustment and management of vascular access device: Secondary | ICD-10-CM | POA: Diagnosis not present

## 2018-03-02 DIAGNOSIS — R0609 Other forms of dyspnea: Secondary | ICD-10-CM | POA: Diagnosis not present

## 2018-03-02 DIAGNOSIS — Z95828 Presence of other vascular implants and grafts: Secondary | ICD-10-CM

## 2018-03-02 DIAGNOSIS — J90 Pleural effusion, not elsewhere classified: Secondary | ICD-10-CM

## 2018-03-02 DIAGNOSIS — Z8719 Personal history of other diseases of the digestive system: Secondary | ICD-10-CM | POA: Diagnosis not present

## 2018-03-02 DIAGNOSIS — E1165 Type 2 diabetes mellitus with hyperglycemia: Secondary | ICD-10-CM

## 2018-03-02 DIAGNOSIS — Z923 Personal history of irradiation: Secondary | ICD-10-CM | POA: Diagnosis not present

## 2018-03-02 DIAGNOSIS — F329 Major depressive disorder, single episode, unspecified: Secondary | ICD-10-CM

## 2018-03-02 DIAGNOSIS — C160 Malignant neoplasm of cardia: Secondary | ICD-10-CM

## 2018-03-02 DIAGNOSIS — R1310 Dysphagia, unspecified: Secondary | ICD-10-CM

## 2018-03-02 DIAGNOSIS — Z79899 Other long term (current) drug therapy: Secondary | ICD-10-CM | POA: Diagnosis not present

## 2018-03-02 DIAGNOSIS — J449 Chronic obstructive pulmonary disease, unspecified: Secondary | ICD-10-CM

## 2018-03-02 DIAGNOSIS — Z9981 Dependence on supplemental oxygen: Secondary | ICD-10-CM | POA: Diagnosis not present

## 2018-03-02 DIAGNOSIS — D61810 Antineoplastic chemotherapy induced pancytopenia: Secondary | ICD-10-CM

## 2018-03-02 DIAGNOSIS — Z86711 Personal history of pulmonary embolism: Secondary | ICD-10-CM | POA: Diagnosis not present

## 2018-03-02 DIAGNOSIS — Z7901 Long term (current) use of anticoagulants: Secondary | ICD-10-CM

## 2018-03-02 DIAGNOSIS — I10 Essential (primary) hypertension: Secondary | ICD-10-CM

## 2018-03-02 DIAGNOSIS — D6481 Anemia due to antineoplastic chemotherapy: Secondary | ICD-10-CM | POA: Diagnosis not present

## 2018-03-02 DIAGNOSIS — Z5111 Encounter for antineoplastic chemotherapy: Secondary | ICD-10-CM | POA: Diagnosis not present

## 2018-03-02 DIAGNOSIS — R05 Cough: Secondary | ICD-10-CM | POA: Diagnosis not present

## 2018-03-02 LAB — CULTURE, BLOOD (ROUTINE X 2)
Culture: NO GROWTH
Culture: NO GROWTH
Special Requests: ADEQUATE

## 2018-03-02 MED ORDER — SODIUM CHLORIDE 0.9% FLUSH
10.0000 mL | INTRAVENOUS | Status: DC | PRN
  Filled 2018-03-02: qty 10

## 2018-03-02 NOTE — Telephone Encounter (Signed)
Called pt and spoke with spouse Velta Addison and was able to schedule pt a HFU with Derl Barrow, NP Friday, 7/19 at 9am.  Nothing further needed.

## 2018-03-02 NOTE — Telephone Encounter (Signed)
Call from lab reporting that the CBC sample was compromised. Per Dr. [name], pt to come back in on Wednesday to get labs redrawn and be sure to hold Xarelto. LVM on pt cell.

## 2018-03-02 NOTE — Telephone Encounter (Signed)
Pt wife Velta Addison is calling back  [phone]

## 2018-03-02 NOTE — Telephone Encounter (Signed)
Attempted to call pt but no answer.  Left message for pt stating to call office so we could schedule HFU.

## 2018-03-02 NOTE — Progress Notes (Signed)
[facility] OFFICE PROGRESS NOTE   Diagnosis: Esophagus cancer  INTERVAL HISTORY:   Ronald Johnson returns as scheduled.  He was discharged 02/27/2018 after an admission with sepsis syndrome.  No source for infection was identified other than a progressive right pleural effusion. No fever or bleeding since discharge from the hospital.  He has developed a "cold sores "over the lips.  He is using nasal cannula oxygen.  Dysphasia has improved.  Objective:  Vital signs in last 24 hours:  Blood pressure 107/69, pulse 90, temperature 98 F (36.7 C), temperature source Oral, resp. rate 18, height 6\' 4"  (1.93 m), weight 200 lb 14.4 oz (91.1 kg), SpO2 96 %.    HEENT: No thrush or ulcers.  Healing ulcerations at the lips Lymphatics: No cervical or supraclavicular nodes Resp: Decreased breath sounds at the right posterior chest, no respiratory distress Cardio: Regular rate and rhythm GI: No hepatomegaly, nontender Vascular: No leg edema   Portacath/PICC-without erythema  Lab Results:  Lab Results  Component Value Date   WBC 6.2 02/27/2018   HGB 10.3 (L) 02/27/2018   HCT 30.8 (L) 02/27/2018   MCV 99.7 02/27/2018   PLT 47 (L) 02/27/2018   NEUTROABS 4.4 02/27/2018    CMP  Lab Results  Component Value Date   NA 141 02/27/2018   K 3.3 (L) 02/27/2018   CL 106 02/27/2018   CO2 29 02/27/2018   GLUCOSE 165 (H) 02/27/2018   BUN 10 02/27/2018   CREATININE 0.72 02/27/2018   CALCIUM 8.5 (L) 02/27/2018   PROT 5.3 (L) 02/24/2018   ALBUMIN 2.7 (L) 02/24/2018   AST 21 02/24/2018   ALT 17 02/24/2018   ALKPHOS 71 02/24/2018   BILITOT 0.8 02/24/2018   GFRNONAA >60 02/27/2018   GFRAA >60 02/27/2018    Lab Results  Component Value Date   CEA1 22.82 (H) 02/16/2018     Medications: I have reviewed the patient's current medications.   Assessment/Plan: 1. 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 EUS 03/16/2015 confirmed a  clinical stage IIb (uT3,uN1) tumor ? Staging CTs of the chest, abdomen, and pelvis with no evidence of metastatic disease ? Initiation of radiation 04/19/2015, cycle 1 Taxol/carboplatin 04/20/2015: Radiation completed 05/29/2015; the fifth and final week of Taxol/carboplatin 05/18/2015 ? Restaging CTs 07/20/2015 revealed no evidence of metastatic disease, incidental left lower lobe pulmonary embolism ? Transhiatal total esophagectomy 07/25/2015 confirmed a ypT3, ypN2 with 4/5 positive lymph nodes, lymphovascular invasion, perineural invasion, negative resection margins ? PD1 score-0 ? CT neck 07/25/2017-4-5 cm mass in the right lower neck upper mediastinum immediately adjacent to the esophageal anastomosis. 12 mm mass within the right parotid enlarged since the previous PET study where it was about half that size. Question few small pulmonary nodules. Borderline enlarged mediastinal lymph nodes. ? CTs brain/chest/abdomen/pelvis 08/06/2017-ill-defined mass at the right thoracic inlet extending into the right superior mediastinum; new bilateral but right much greater than left peripheral reticulonodular and groundglass opacities; scattered small paratracheal and anterior mediastinal lymph nodes; small bilateral axillary lymph nodes. Brain CT with no evidence of metastasis. ? 08/26/2017 biopsy right supraclavicular lymph node-very scant fragment of benign lymph node. ? PET scan 09/11/2017-poorly marginated heterogeneously hypermetabolic soft tissue mass at the right thoracic inlet extending laterally from the esophagogastric anastomosis in the neck. No definite hypermetabolic metastatic disease.Patchy tree-in-bud opacities throughout the lungs significantly decreased. Persistent scattered subcentimeter solid pulmonary nodules in both lungs stable to decreased. Stable irregular 1.7 cm nodular bandlike opacity anterior left  lower lobe with associated low-level metabolism. Nonspecific mildly hypermetabolic  subcarinal and bilateral hilar lymph nodes. Nonspecific mildly hypermetabolic nodular focus of soft tissue in the upper left retroperitoneum anterior to the adrenal gland. ? Upper endoscopy 09/15/2017-at 20 cm from the incisors tight stricture at the anastomosis measuring only a few millimeters in diameter. Surrounding tissues firm, slightly friable, noncompliant. Stricture was dilated with a 15 mm balloon several times. Diameter of stricture improved but still would not permit passage of a standard upper endoscope. Friable areas biopsied-invasive adenocarcinoma. ? Initiation of radiation and concurrent capecitabine 2/4/2019completed 10/30/2017 ? CTs neck and chest 12/23/2017-stable abnormal soft tissue at the right thoracic inlet with encasement of the proximal right common carotid artery, interval development of sclerotic lesions at C7 and T1, new indeterminate right lung opacities ? Cycle 1 FOLFOX 01/08/2018 ? Cycle 2 FOLFOX 02/02/2018 (Oxaliplatin dose reduced due to neutropenia) ? Cycle 3 FOLFOX 02/16/2018, white cell growth factor support added ? CT 02/24/2018- progressive loculated right effusion, stable lung nodules, stable thoracic inlet soft tissue, small peripheral pulmonary emboli  2. Postprandial subxiphoid pain secondary to #1  3. Diabetes-elevated blood sugar readings at the [facility] and at home  4. COPD  5. Depression-Improved with Wellbutrin  6. Hypertension  7. History of adenomatous colon polyps  8. History of diabetic related neuropathy, peripheral arterial disease, and nephropathy  9. History of nausea-likely related to radiation esophagitis/gastritis  10. Incidental pulmonary embolism noted on the CT 07/20/2015, treated with Xarelto  11. Admission 08/18/2015 with GI bleeding while on Xarelto, anticoagulation therapy discontinued  12. Dysphagia-improved with esophageal dilatation procedures by Dr. Henrene Pastor, progressive dysphagia secondary  to local recurrence of esophagus cancer with obstruction at the esophagus anastomosis  13. Pancytopenia secondary to chemotherapy  14. CT scan abdomen/pelvis 10/09/2016 (done to evaluate persistent nausea, epigastric pain)-no acute findings or explanation for the patient's symptoms. Resolving postsurgical changes from presumed previous esophagectomy and gastric pull-through. No evidence of metastatic disease.  15. Admission 02/24/2018 with sepsis syndrome- probable pulmonary source for infection with a right effusion, cultures negative   16.  Incidental pulmonary emboli noted on CT 02/24/2018, Xarelto started 03/02/2018      Disposition: Ronald Johnson appears stable.  He has recovered from the admission with a febrile illness.  The fever was most likely related to a pulmonary infection.  He has a progressive loculated right effusion.  The effusion was not tapped.  Chemotherapy will be held today.  Cycle 4 FOLFOX will be scheduled for 03/09/2018.  He was noted to have incidental left lung pulmonary emboli on a CT 02/24/2018.  I reviewed the CT images.  The pulmonary emboli are new.  He will begin Xarelto anticoagulation if the platelet count has recovered today.  We reviewed the risk of bleeding with Xarelto.  He agrees to proceed.  Ronald Johnson will be scheduled for an office visit prior to FOLFOX on 03/09/2018.  He will be scheduled for an office visit and chemotherapy on 03/23/2018.  I will dose reduce the oxalic platinum secondary to thrombocytopenia following the most recent cycle of chemotherapy.  25 minutes were spent with the patient today.  The majority of the time was used for counseling and coordination of care.  Betsy Coder, MD  03/02/2018  10:22 AM

## 2018-03-02 NOTE — Telephone Encounter (Signed)
Gave patients wife avs and calendar of upcoming July and aug appts. Patient scheduled in LT break slot. OK per LT. Patient aware of gap between visit and treatment on 8/5

## 2018-03-03 ENCOUNTER — Other Ambulatory Visit: Payer: Self-pay

## 2018-03-03 ENCOUNTER — Telehealth: Payer: Self-pay | Admitting: Oncology

## 2018-03-03 ENCOUNTER — Encounter: Payer: Self-pay | Admitting: Physical Therapy

## 2018-03-03 ENCOUNTER — Ambulatory Visit: Payer: Medicare Other | Attending: Internal Medicine | Admitting: Occupational Therapy

## 2018-03-03 ENCOUNTER — Ambulatory Visit: Payer: Medicare Other | Admitting: Physical Therapy

## 2018-03-03 DIAGNOSIS — R2681 Unsteadiness on feet: Secondary | ICD-10-CM | POA: Diagnosis not present

## 2018-03-03 DIAGNOSIS — R5381 Other malaise: Secondary | ICD-10-CM

## 2018-03-03 DIAGNOSIS — R2689 Other abnormalities of gait and mobility: Secondary | ICD-10-CM | POA: Insufficient documentation

## 2018-03-03 DIAGNOSIS — R278 Other lack of coordination: Secondary | ICD-10-CM | POA: Diagnosis not present

## 2018-03-03 DIAGNOSIS — R41844 Frontal lobe and executive function deficit: Secondary | ICD-10-CM | POA: Diagnosis not present

## 2018-03-03 DIAGNOSIS — M25611 Stiffness of right shoulder, not elsewhere classified: Secondary | ICD-10-CM | POA: Diagnosis not present

## 2018-03-03 DIAGNOSIS — M6281 Muscle weakness (generalized): Secondary | ICD-10-CM | POA: Diagnosis not present

## 2018-03-03 NOTE — Therapy (Signed)
[facility] [address] [place], [place], [address] Phone: [phone]   Fax:  [phone]  Occupational Therapy Treatment  Patient Details  Name: Ronald Johnson MRN: [id number] Date of Birth: Apr 13, 1946 Referring Provider: Dr. Irine Seal, (Dr. [name] is PCP)   Encounter Date: 03/03/2018  OT End of Session - 03/03/18 1236    Visit Number  1    Number of Visits  17    Date for OT Re-Evaluation  05/02/18    Authorization Type  Medicare    Authorization Time Period  POC 2x week x 8 weeks, cert 90 days- to 32/67/12    OT Start Time  0805    OT Stop Time  0845    OT Time Calculation (min)  40 min    Behavior During Therapy  [facility] for tasks assessed/performed       Past Medical History:  Diagnosis Date  . Anemia 08/15/2009  . Anxiety   . Arthritis   . [name]'s esophagus 2007  . COLONIC POLYPS, ADENOMATOUS 08/01/2008, 2013, 2014  . COPD (chronic obstructive pulmonary disease) (HCC)    no per pt  . Depression   . Diabetes type 2, controlled ([place]) 03/13/2007  . Diverticulitis   . ED (erectile dysfunction)   . EMPHYSEMA 11/08/2008   no per pt  . Esophageal cancer ([facility]) "dx'd ~ 01/2015"  . GERD 03/13/2007  . GIB (gastrointestinal bleeding) 08/18/2015  . Hiatal hernia   . History of blood transfusion 08/18/2015   due to GIB  . HYPERCHOLESTEROLEMIA 02/03/2008  . HYPERTENSION 03/13/2007   pt denies, claims white coat syndrome  . Pneumonia   . Prostatism   . PULMONARY NODULE 11/24/2008  . Stomach cancer ([place]) "dx'd ~ 01/2015"  . TOBACCO ABUSE 11/24/2008    Past Surgical History:  Procedure Laterality Date  . BALLOON DILATION N/A 03/19/2016   Procedure: BALLOON DILATION;  Surgeon: Irene Shipper, MD;  Location: [facility];  Service: Endoscopy;  Laterality: N/A;  . CIRCUMCISION    . COLONOSCOPY    . COMPLETE ESOPHAGECTOMY N/A 07/25/2015   Procedure: TRANSHIATAL TOTAL ESOPHAGECTOMY COMPLETE PYLOROMYOTOMY;   Surgeon: [name], MD;  Location: [facility];  Service: Thoracic;  Laterality: N/A;  . EGD  11/19/2005  . ESOPHAGOGASTRODUODENOSCOPY N/A 03/19/2016   Procedure: ESOPHAGOGASTRODUODENOSCOPY (EGD);  Surgeon: Irene Shipper, MD;  Location: [facility];  Service: Endoscopy;  Laterality: N/A;  . ESOPHAGOGASTRODUODENOSCOPY (EGD) WITH PROPOFOL N/A 09/15/2017   Procedure: ESOPHAGOGASTRODUODENOSCOPY (EGD) WITH PROPOFOL;  Surgeon: Irene Shipper, MD;  Location: [facility];  Service: Endoscopy;  Laterality: N/A;  . EUS N/A 03/16/2015   Procedure: UPPER ENDOSCOPIC ULTRASOUND (EUS) RADIAL;  Surgeon: Milus Banister, MD;  Location: [facility];  Service: Endoscopy;  Laterality: N/A;  . FOOT FRACTURE SURGERY Right 1980   right foot w/ pins and screws  . IR IMAGING GUIDED PORT INSERTION  01/05/2018  . IR US GUIDE VASC ACCESS LEFT  01/05/2018  . JEJUNOSTOMY N/A 07/25/2015   Procedure: FEEDING JEJUNOSTOMY;  Surgeon: [name], MD;  Location: [facility];  Service: Thoracic;  Laterality: N/A;  . MICROLARYNGOSCOPY W/VOCAL CORD INJECTION N/A 01/22/2018   Procedure: MICROLARYNGOSCOPY WITH VOCAL CORD INJECTION;  Surgeon: Melissa Montane, MD;  Location: [facility];  Service: ENT;  Laterality: N/A;  . POLYPECTOMY    . removal pins and screws foot  1980   right foot  . UPPER GASTROINTESTINAL ENDOSCOPY    . VASECTOMY    . VIDEO  BRONCHOSCOPY N/A 07/25/2015   Procedure: VIDEO BRONCHOSCOPY;  Surgeon: [name], MD;  Location: [facility] OR;  Service: Thoracic;  Laterality: N/A;    There were no vitals filed for this visit.  Subjective Assessment - 03/03/18 0810    Subjective   Pt denies pain today    Pertinent History  esophageal CA, undergoing chemo 1x every 2 weeks PMH esophageal CA, undergoing chemo, hx of septic shock, pleural effusion, DM, pancytopenia, HTN, bradycardia, PE, dysphagia    Limitations  soft diet,     Patient Stated Goals  to get back to doing what I was doing before     Currently in Pain?  No/denies          [facility] OT Assessment - 03/03/18 0813      Assessment   Medical Diagnosis  deconditioning following hospitalization for septic shock, pleural effusion, esophageal CA    Referring Provider  Dr. Irine Seal, (Dr. [name] is PCP)    Onset Date/Surgical Date  02/24/18      Precautions   Precautions  Fall    Precaution Comments  2L O2      Balance Screen   Has the patient fallen in the past 6 months  No    Has the patient had a decrease in activity level because of a fear of falling?   No    Is the patient reluctant to leave their home because of a fear of falling?   No      Home  Environment   Family/patient expects to be discharged to:  Private residence    Type of [place] Shower/Tub  Tub/Shower unit grab bar    Lives With  [place]      Prior Function   Level of [place]  Retired    Hospital doctor previously      ADL   Eating/Feeding  Modified independent    Grooming  Modified independent    Scientist, clinical (histocompatibility and immunogenetics)  Minimal assistance    Lower Body Bathing  Minimal assistance    Upper Body Dressing  Set up    Lower Body Dressing  Minimal assistance    Toilet Transfer  Supervision/safety    Tub/Shower Transfer  Minimal assistance    ADL comments  Pt fatigues after approximately 10 mins functional activity in standing      IADL   Shopping  Completely unable to [place] help with all home maintenance tasks Pt did yardwork     Meal Prep  Needs to have meals prepared and served did not cook before    Medication Management  Takes responsibility if medication is prepared in advance in [place] dosage    Financial Management  Dependent      Mobility   Mobility Status  -- supervision with walker      Written Expression   Dominant Hand  Right    Handwriting  100% legible mildly decreased legibility      Vision - History   Baseline Vision  Wears glasses only for  reading      Vision Assessment   Vision Assessment  Vision not tested    Comment  hx of Horner's syndrome      Cognition   Overall Cognitive Status  Impaired/Different from baseline    Memory  Impaired    Memory Impairment  Decreased short term memory  Sensation   Light Touch  Not tested      Coordination   Fine Motor Movements are Fluid and Coordinated  No bradykinsesia noted with fine motor coordination tasks      ROM / Strength   AROM / PROM / Strength  AROM;Strength      AROM   Overall AROM   Deficits    Overall AROM Comments  RUE shoulder flexion 100, shoulder abduction 90, LUE shoulder flexion 130, abduction 115      Strength   Overall Strength  Deficits    Overall Strength Comments  RUE proximal 3+/5, distal 4/5, LUE 4/5                         OT Short Term Goals - 03/03/18 1301      OT SHORT TERM GOAL #1   Title  I with HEP for strength and coordination due 04/02/18    Time  4    Period  Weeks    Status  New    Target Date  04/02/18      OT SHORT TERM GOAL #2   Title  Pt will perfrorm all basic ADLS with supervision.    Time  4    Period  Weeks    Status  New      OT SHORT TERM GOAL #3   Title  Pt will demonstrate ability to retrieve a lightweight object at 110 shoulder flexion with RUE.    Time  4    Period  Weeks    Status  New      OT SHORT TERM GOAL #4   Title  Pt will demonstrate ability to perform functional activities in standing x 15 mins prior to rest break with no LOB.    Time  4    Period  Weeks    Status  New      OT SHORT TERM GOAL #5   Title  Pt / wife will verbalize understanding of memory compensations and ways to keep thinking skills sharp.    Time  4    Period  Weeks    Status  New      Additional Short Term Goals   Additional Short Term Goals  Yes      OT SHORT TERM GOAL #6   Title  Pt will perfrom tub bench transfer with supervision.    Time  4    Period  Weeks    Status  New        OT Long  Term Goals - 03/03/18 1305      OT LONG TERM GOAL #1   Title  I with updated HEP. due 05/02/18    Time  8    Period  Weeks    Status  New    Target Date  05/02/18      OT LONG TERM GOAL #2   Title  Pt will perform basic ADLs modified independently.    Time  8    Period  Weeks    Status  New      OT LONG TERM GOAL #3   Title  Pt will demonstrate ability to perform functional activity in standing x 25 mins prior to rest break without LOB.    Time  8    Period  Weeks    Status  New      OT LONG TERM GOAL #4   Title  Further assess cognition and set  goal prn    Time  8    Period  Weeks    Status  New      OT LONG TERM GOAL #5   Title  Pt will demonstrate adequate RUE strength to retrieve 5 lbs weight from overhead shelf at 110 shouler flexion x 5 without dropping.    Time  8    Period  Weeks    Status  New      Long Term Additional Goals   Additional Long Term Goals  Yes      OT LONG TERM GOAL #6   Title  Pt will perform basic home management, beverage prep at a modified independent level    Time  4    Period  Weeks    Status  New            Plan - 03/03/18 1239    Clinical Impression Statement  Pt is a 72 y.o with esophageal CA, who was hospitalized 02/24/18 with septic shock, pleural effusion. Pt was d/c home on 02/27/18. Pt presents with the following deficits: decreased strength, decreased activity tolerance, decreased coordination, decreased balance, cogntive deficits, and decreased ROM which impedes performance of ADLS/ IADLS. Pt can benefit from skilled occupational therapy to maximize pt's safety and independence with daily activities.    Occupational Profile and client history currently impacting functional performance  PMH esophageal CA, undergoing chemo, hx of septic shock, pleural effusion, DM, pancytopenia, HTN, bradycardia, PE, dysphagiaPt is retired. Prior level of function I with ADLs and yardwork.    Occupational performance deficits (Please refer to  evaluation for details):  ADL's;IADL's;Leisure;Social Participation    Rehab Potential  Fair    Current Impairments/barriers affecting progress:  severity of medial issues, ongoing chemotherapy, cognitive deficits, decreased endurance, O2 dependent   OT Frequency  2x / week plus eval    OT Duration  8 weeks    OT Treatment/Interventions  Self-care/ADL training;Therapeutic exercise;Patient/family education;Neuromuscular education;Paraffin;Moist Heat;Fluidtherapy;Therapeutic activities;Balance training;Cognitive remediation/compensation;Passive range of motion;Manual Therapy;DME and/or AE instruction;Ultrasound;Cryotherapy    Plan  educate regarding tub bench and ADL strategies, activity tolerance/standing balance    Clinical Decision Making  Several treatment options, min-mod task modification necessary    Consulted and Agree with Plan of Care  Patient       Patient will benefit from skilled therapeutic intervention in order to improve the following deficits and impairments:  Abnormal gait, Decreased mobility, Decreased strength, Decreased range of motion, Decreased endurance, Decreased activity tolerance, Decreased balance, Decreased knowledge of precautions, Decreased safety awareness, Difficulty walking, Impaired UE functional use, Decreased coordination, Cardiopulmonary status limiting activity, Decreased cognition, Decreased knowledge of use of DME, Impaired flexibility  Visit Diagnosis: Stiffness of right shoulder, not elsewhere classified - Plan: Ot plan of care cert/re-cert  Muscle weakness (generalized) - Plan: Ot plan of care cert/re-cert  Other lack of coordination - Plan: Ot plan of care cert/re-cert  Other abnormalities of gait and mobility - Plan: Ot plan of care cert/re-cert  Frontal lobe and executive function deficit - Plan: Ot plan of care cert/re-cert    Problem List Patient Active Problem List   Diagnosis Date Noted  . Septic shock ([place])   . Severe sepsis with septic  shock ([place]) 02/26/2018  . Pleural effusion on right 02/26/2018  . Pancytopenia ([place])   . Port-A-Cath in place 01/08/2018  . Goals of care, counseling/discussion 12/25/2017  . Secondary malignant neoplasm of cervical lymph node ([place]) 09/30/2017  . Headache 06/30/2017  . Abnormal ECG 06/18/2017  .  Tremor 05/05/2017  . Erectile dysfunction 10/28/2016  . Adhesive capsulitis of right shoulder associated with type 2 diabetes mellitus ([place]) 09/02/2016  . Esophageal stricture   . Dysphagia 01/23/2016  . Insulin dependent diabetes mellitus ([place]) 01/23/2016  . Controlled type 2 diabetes mellitus with diabetic peripheral angiopathy without gangrene, with long-term current use of insulin ([place]) 10/30/2015  . Fever 09/01/2015  . Diabetes mellitus due to underlying condition, uncontrolled, with circulatory complication ([place])   . Chronic diastolic heart failure ([place])   . Anemia requiring transfusions   . Hx pulmonary embolism   . Elevated troponin   . GI bleed 08/18/2015  . Symptomatic anemia 08/18/2015  . Pulmonary embolism ([place]) 08/18/2015  . HLD (hyperlipidemia) 08/18/2015  . GIB (gastrointestinal bleeding) 08/18/2015  . Gastrointestinal hemorrhage with melena   . Esophageal cancer ([place]) 07/25/2015  . LUQ abdominal pain 07/07/2015  . GE junction carcinoma ([place]) 03/28/2015  . Chest pain 12/02/2014  . Quit smoking within past year 11/04/2013  . Screening for prostate cancer 11/04/2013  . Encounter for long-term (current) use of other medications 11/04/2013  . Routine general medical examination at a health care facility 11/08/2012  . Gastroenteritis 09/10/2011  . PROTEINURIA, MILD 08/15/2009  . ANXIETY STATE, UNSPECIFIED 12/15/2008  . PULMONARY NODULE 11/24/2008  . EMPHYSEMA 11/08/2008  . COLONIC POLYPS, ADENOMATOUS 08/01/2008  . UNSPECIFIED INFLAMMATORY AND TOXIC NEUROPATHY 05/19/2008  . CHEST PAIN, PLEURITIC 05/19/2008  . HYPERCHOLESTEROLEMIA 02/03/2008  . Depression with anxiety  03/13/2007  . Essential hypertension 03/13/2007  . GERD 03/13/2007    RINE,KATHRYN 03/03/2018, 4:10 PM Theone Murdoch, OTR/L [phone] Phone: [phone] 4:10 PM 03/03/18 [facility] [address] [place], [place], [address] Phone: [phone]   Fax:  [phone]  Name: ADEKUNLE ROHRBACH MRN: [id number] Date of Birth: [birth year]

## 2018-03-03 NOTE — Telephone Encounter (Signed)
Called pt to set up a lab appt per 7/15- spoke w/ pt re appts.

## 2018-03-04 ENCOUNTER — Telehealth: Payer: Self-pay

## 2018-03-04 ENCOUNTER — Inpatient Hospital Stay: Payer: Medicare Other

## 2018-03-04 ENCOUNTER — Other Ambulatory Visit: Payer: Self-pay

## 2018-03-04 ENCOUNTER — Ambulatory Visit: Payer: Self-pay | Admitting: Physical Therapy

## 2018-03-04 DIAGNOSIS — D61810 Antineoplastic chemotherapy induced pancytopenia: Secondary | ICD-10-CM | POA: Diagnosis not present

## 2018-03-04 DIAGNOSIS — C160 Malignant neoplasm of cardia: Secondary | ICD-10-CM | POA: Diagnosis not present

## 2018-03-04 DIAGNOSIS — C159 Malignant neoplasm of esophagus, unspecified: Secondary | ICD-10-CM

## 2018-03-04 DIAGNOSIS — Z5111 Encounter for antineoplastic chemotherapy: Secondary | ICD-10-CM | POA: Diagnosis not present

## 2018-03-04 DIAGNOSIS — Z923 Personal history of irradiation: Secondary | ICD-10-CM | POA: Diagnosis not present

## 2018-03-04 DIAGNOSIS — Z452 Encounter for adjustment and management of vascular access device: Secondary | ICD-10-CM | POA: Diagnosis not present

## 2018-03-04 DIAGNOSIS — D6481 Anemia due to antineoplastic chemotherapy: Secondary | ICD-10-CM | POA: Diagnosis not present

## 2018-03-04 LAB — CBC WITH DIFFERENTIAL (CANCER CENTER ONLY)
BASOS PCT: 0 %
Basophils Absolute: 0 10*3/uL (ref 0.0–0.1)
Eosinophils Absolute: 0 10*3/uL (ref 0.0–0.5)
Eosinophils Relative: 0 %
HEMATOCRIT: 36.6 % — AB (ref 38.4–49.9)
HEMOGLOBIN: 11.8 g/dL — AB (ref 13.0–17.1)
LYMPHS ABS: 1.3 10*3/uL (ref 0.9–3.3)
LYMPHS PCT: 10 %
MCH: 33.1 pg (ref 27.2–33.4)
MCHC: 32.2 g/dL (ref 32.0–36.0)
MCV: 102.8 fL — ABNORMAL HIGH (ref 79.3–98.0)
MONOS PCT: 11 %
Monocytes Absolute: 1.5 10*3/uL — ABNORMAL HIGH (ref 0.1–0.9)
NEUTROS ABS: 10 10*3/uL — AB (ref 1.5–6.5)
Neutrophils Relative %: 79 %
Platelet Count: 101 10*3/uL — ABNORMAL LOW (ref 140–400)
RBC: 3.56 MIL/uL — ABNORMAL LOW (ref 4.20–5.82)
RDW: 16.2 % — ABNORMAL HIGH (ref 11.0–14.6)
WBC Count: 12.8 10*3/uL — ABNORMAL HIGH (ref 4.0–10.3)

## 2018-03-04 MED ORDER — RIVAROXABAN 20 MG PO TABS: 20.0000 mg | ORAL_TABLET | Freq: Every day | ORAL | 0 refills | Status: DC

## 2018-03-04 NOTE — Therapy (Signed)
[facility] [address] [place], [place], [address] Phone: [phone]   Fax:  [phone]  Physical Therapy Evaluation  Patient Details  Name: Ronald Johnson MRN: [id number] Date of Birth: [birth year] Referring Provider: Dr. Irine Seal [name], MD is PCP)   Encounter Date: 03/03/2018  PT End of Session - 03/04/18 2130    Visit Number  1    Number of Visits  17    Date for PT Re-Evaluation  05/03/18    Authorization Type  Medicare and [employer]    Authorization Time Period  03-03-18 - 06-01-18    PT Start Time  0855    PT Stop Time  0930    PT Time Calculation (min)  35 min    Equipment Utilized During Treatment  Oxygen       Past Medical History:  Diagnosis Date  . Anemia 08/15/2009  . Anxiety   . Arthritis   . [name]'s esophagus 2007  . COLONIC POLYPS, ADENOMATOUS 08/01/2008, 2013, 2014  . COPD (chronic obstructive pulmonary disease) (HCC)    no per pt  . Depression   . Diabetes type 2, controlled ([place]) 03/13/2007  . Diverticulitis   . ED (erectile dysfunction)   . EMPHYSEMA 11/08/2008   no per pt  . Esophageal cancer ([place]) "dx'd ~ 01/2015"  . GERD 03/13/2007  . GIB (gastrointestinal bleeding) 08/18/2015  . Hiatal hernia   . History of blood transfusion 08/18/2015   due to GIB  . HYPERCHOLESTEROLEMIA 02/03/2008  . HYPERTENSION 03/13/2007   pt denies, claims white coat syndrome  . Pneumonia   . Prostatism   . PULMONARY NODULE 11/24/2008  . Stomach cancer ([place]) "dx'd ~ 01/2015"  . TOBACCO ABUSE 11/24/2008    Past Surgical History:  Procedure Laterality Date  . BALLOON DILATION N/A 03/19/2016   Procedure: BALLOON DILATION;  Surgeon: Irene Shipper, MD;  Location: [facility];  Service: Endoscopy;  Laterality: N/A;  . CIRCUMCISION    . COLONOSCOPY    . COMPLETE ESOPHAGECTOMY N/A 07/25/2015   Procedure: TRANSHIATAL TOTAL ESOPHAGECTOMY COMPLETE PYLOROMYOTOMY;  Surgeon: [name], MD;  Location: [facility];  Service: Thoracic;  Laterality: N/A;  . EGD  11/19/2005  . ESOPHAGOGASTRODUODENOSCOPY N/A 03/19/2016   Procedure: ESOPHAGOGASTRODUODENOSCOPY (EGD);  Surgeon: Irene Shipper, MD;  Location: [facility];  Service: Endoscopy;  Laterality: N/A;  . ESOPHAGOGASTRODUODENOSCOPY (EGD) WITH PROPOFOL N/A 09/15/2017   Procedure: ESOPHAGOGASTRODUODENOSCOPY (EGD) WITH PROPOFOL;  Surgeon: Irene Shipper, MD;  Location: [facility];  Service: Endoscopy;  Laterality: N/A;  . EUS N/A 03/16/2015   Procedure: UPPER ENDOSCOPIC ULTRASOUND (EUS) RADIAL;  Surgeon: Milus Banister, MD;  Location: [facility];  Service: Endoscopy;  Laterality: N/A;  . FOOT FRACTURE SURGERY Right 1980   right foot w/ pins and screws  . IR IMAGING GUIDED PORT INSERTION  01/05/2018  . IR US GUIDE VASC ACCESS LEFT  01/05/2018  . JEJUNOSTOMY N/A 07/25/2015   Procedure: FEEDING JEJUNOSTOMY;  Surgeon: [name], MD;  Location: [facility];  Service: Thoracic;  Laterality: N/A;  . MICROLARYNGOSCOPY W/VOCAL CORD INJECTION N/A 01/22/2018   Procedure: MICROLARYNGOSCOPY WITH VOCAL CORD INJECTION;  Surgeon: Melissa Montane, MD;  Location: [facility];  Service: ENT;  Laterality: N/A;  . POLYPECTOMY    . removal pins and screws foot  1980   right foot  . UPPER GASTROINTESTINAL ENDOSCOPY    . VASECTOMY    . VIDEO BRONCHOSCOPY N/A 07/25/2015   Procedure: VIDEO BRONCHOSCOPY;  Surgeon: [name], MD;  Location: [facility] OR;  Service: Thoracic;  Laterality: N/A;    There were no vitals filed for this visit.   Subjective Assessment - 03/04/18 2115    Subjective  Pt is a 72 yr old gentleman s/p GE junction carcinoma (esophageal cancer)  with deconditioning and debility.   Pt was hospitalized 02-24-18 -02-27-18 at [facility] with severe sepsis with septic shock.  Pt presents to PT eval using RW and accompanied by his wife; pt is on 2L of O2,  Pt states he is very cold - has blanket draped around him                                                                                                                                              Patient is accompained by:  Family member    Pertinent History  pt currently getting chemo; on 2L O2; severe sepsis with septic shock (likely due to Rt pleural effusion):  bradycardia:  HTN:  GE junction carcinoma:  IDDM    Patient Stated Goals  "get back to where I was at" - improve endurance- walk without RW    Currently in Pain?  No/denies         [facility] PT Assessment - 03/04/18 0001      Assessment   Medical Diagnosis  Deconditioning following hospitalization for severe sepsis, pleural effusion, esophageal cancer    Referring Provider  Dr. Irine Seal [name], MD is PCP)    Onset Date/Surgical Date  02/24/18      Precautions   Precautions  Fall;Other (comment) on 2L oxygen    Precaution Comments  pt has [place] residence    Living Arrangements  Spouse/significant other    Type of [place] to enter    Entrance Stairs-Number of Steps  1    Entrance Stairs-Rails  None      Prior Function   Level of Independence  Independent    Vocation  Retired    Hospital doctor previously      Editor, commissioning  Appears Intact      ROM / Strength   AROM / PROM / Strength  AROM;Strength      Strength   Overall Strength Comments  bil. hip flexors 4/5:  bil. quads 5/5; bil. dorsiflexors 4+/5       Ambulation/Gait   Ambulation/Gait  Yes    Ambulation/Gait Assistance  5: Supervision    Ambulation Distance (Feet)  100 Feet    Assistive device  Rolling walker    Gait Pattern  Step-through pattern    Ambulation Surface  Level;Indoor    Gait velocity  21.59 = 1.52 ft/sec with RW  Balance   Balance Assessed  Yes      Static Standing Balance   Static Standing - Balance Support  No upper extremity supported    Static Standing - Level of Assistance  5: Stand by assistance    Static Standing  - Comment/# of Minutes  2      Standardized Balance Assessment   Standardized Balance Assessment  Timed Up and Go Test      Timed Up and Go Test   TUG  Normal TUG    Normal TUG (seconds)  46.91 with RW                Objective measurements completed on examination: See above findings.              PT Education - 03/04/18 2129    Education Details  eval results - pt is at fall risk per TUG     Person(s) Educated  Patient;Spouse    Methods  Explanation    Comprehension  Verbalized understanding       PT Short Term Goals - 03/04/18 2141      PT SHORT TERM GOAL #1   Title  Pt will demonstrate increased LE strength so he is able to perform sit to stand 5 times from mat without UE support.    Time  4    Period  Weeks    Status  New    Target Date  04/03/18      PT SHORT TERM GOAL #2   Title  Improve Berg balance test score by at least 4 points to reduce fall risk.    Time  4    Period  Weeks    Status  New    Target Date  04/03/18      PT SHORT TERM GOAL #3   Title  Improve gait velocity from 1.52 ft/sec (21.59 secs) to >/= 1.9 ft/sec with RW.    Baseline  21.59 secs = 1.52 ft/sec    Time  4    Period  Weeks    Status  New    Target Date  04/03/18      PT SHORT TERM GOAL #4   Title  Improve TUG score from 46.91 secs to </= 30 secs with RW to demo improved functional mobility.    Baseline  46.91 secs with RW    Time  4    Period  Weeks    Status  New    Target Date  04/03/18      PT SHORT TERM GOAL #5   Title  Independent in HEP for strengthening, balance and walking program.    Time  4    Period  Weeks    Status  New    Target Date  04/03/18      PT SHORT TERM GOAL #6   Title  Amb. 100' without RW with SBA without LOB.    Time  4    Period  Weeks    Status  New    Target Date  04/03/18        PT Long Term Goals - 03/04/18 2146      PT LONG TERM GOAL #1   Title  Improve Berg balance test score by at least 8 points from baseline     Time  8    Period  Weeks    Status  New    Target Date  05/04/18      PT LONG TERM  GOAL #2   Title  Modified independent household ambulation without device.    Time  8    Period  Weeks    Status  New    Target Date  05/04/18      PT LONG TERM GOAL #3   Title  Improve TUG score from 46.91 to </= 25 secs with RW for reduced fall risk.    Baseline  46.91 secs with RW    Time  8    Period  Weeks    Status  New    Target Date  05/04/18      PT LONG TERM GOAL #4   Title  Increase gait velocity from 1.52 ft/sec to >/= 2.3 ft/sec with RW for incr. gait efficiency.    Baseline  1.52 ft/sec = 21.59 secs     Time  8    Period  Weeks    Status  New    Target Date  05/04/18      PT LONG TERM GOAL #5   Title  Increase distance in 3" walk test with RW by at least 100' to demo increased endurance/activity tolerance.     Time  8    Period  Weeks    Status  New    Target Date  05/04/18             Plan - 03/04/18 2132    Clinical Impression Statement  Pt is a 72 yr old gentleman with debility and deconditioning due to severe sepsis with septic shock due to pleural effusion; pt has esophageal cancer and was hospitalized 7-9 - 02-27-18 with sepsis.  Pt is on 2 L O2 and is using RW for assistance with ambulation.  Pt presents with severely decr. endurance (unable to tolerate Berg test on day of eval due to fatigue as pt had OT eval prior to PT eval).  Pt also presents with gait and balance deficits.      History and Personal Factors relevant to plan of care:  Pt has port-A cath: is receiving infusion chemo on Mondays (removes it on Wed.) every 2 weeks with plan for 5 tx's total to determine if beneficial; IDDM:  bradycardai; esophageal cancer; on 2L oxygen     Clinical Presentation  Evolving    Clinical Presentation due to:  esophageal cancer; sepsis with septic shock due to pleural effusion    Clinical Decision Making  Moderate    Rehab Potential  Good    PT Frequency  2x / week    PT  Duration  8 weeks    PT Treatment/Interventions  ADLs/Self Care Home Management;DME Instruction;Gait training;Stair training;Functional mobility training;Patient/family education;Neuromuscular re-education;Therapeutic exercise;Therapeutic activities;Balance training    PT Next Visit Plan  do Berg balance test; attempt 3" walk test for baseline; begin HEP for functional strengthening and balance    PT Home Exercise Plan  see above    Recommended Other Services  pt is receiving OT    Consulted and Agree with Plan of Care  Patient;Family member/caregiver    Family Member Consulted  wife - Velta Addison       Patient will benefit from skilled therapeutic intervention in order to improve the following deficits and impairments:  Abnormal gait, Decreased activity tolerance, Decreased balance, Decreased endurance, Decreased knowledge of use of DME, Decreased mobility, Decreased strength, Impaired UE functional use  Visit Diagnosis: Other abnormalities of gait and mobility - Plan: PT plan of care cert/re-cert  Muscle weakness (generalized) - Plan: PT plan  of care cert/re-cert  Physical deconditioning - Plan: PT plan of care cert/re-cert  Unsteadiness on feet - Plan: PT plan of care cert/re-cert     Problem List Patient Active Problem List   Diagnosis Date Noted  . Septic shock ([place])   . Severe sepsis with septic shock ([place]) 02/26/2018  . Pleural effusion on right 02/26/2018  . Pancytopenia ([place])   . Port-A-Cath in place 01/08/2018  . Goals of care, counseling/discussion 12/25/2017  . Secondary malignant neoplasm of cervical lymph node ([place]) 09/30/2017  . Headache 06/30/2017  . Abnormal ECG 06/18/2017  . Tremor 05/05/2017  . Erectile dysfunction 10/28/2016  . Adhesive capsulitis of right shoulder associated with type 2 diabetes mellitus ([place]) 09/02/2016  . Esophageal stricture   . Dysphagia 01/23/2016  . Insulin dependent diabetes mellitus ([place]) 01/23/2016  . Controlled type 2 diabetes  mellitus with diabetic peripheral angiopathy without gangrene, with long-term current use of insulin ([place]) 10/30/2015  . Fever 09/01/2015  . Diabetes mellitus due to underlying condition, uncontrolled, with circulatory complication ([place])   . Chronic diastolic heart failure ([place])   . Anemia requiring transfusions   . Hx pulmonary embolism   . Elevated troponin   . GI bleed 08/18/2015  . Symptomatic anemia 08/18/2015  . Pulmonary embolism ([place]) 08/18/2015  . HLD (hyperlipidemia) 08/18/2015  . GIB (gastrointestinal bleeding) 08/18/2015  . Gastrointestinal hemorrhage with melena   . Esophageal cancer ([place]) 07/25/2015  . LUQ abdominal pain 07/07/2015  . GE junction carcinoma ([place]) 03/28/2015  . Chest pain 12/02/2014  . Quit smoking within past year 11/04/2013  . Screening for prostate cancer 11/04/2013  . Encounter for long-term (current) use of other medications 11/04/2013  . Routine general medical examination at a health care facility 11/08/2012  . Gastroenteritis 09/10/2011  . PROTEINURIA, MILD 08/15/2009  . ANXIETY STATE, UNSPECIFIED 12/15/2008  . PULMONARY NODULE 11/24/2008  . EMPHYSEMA 11/08/2008  . COLONIC POLYPS, ADENOMATOUS 08/01/2008  . UNSPECIFIED INFLAMMATORY AND TOXIC NEUROPATHY 05/19/2008  . CHEST PAIN, PLEURITIC 05/19/2008  . HYPERCHOLESTEROLEMIA 02/03/2008  . Depression with anxiety 03/13/2007  . Essential hypertension 03/13/2007  . GERD 03/13/2007    Alda Lea, PT 03/04/2018, 9:58 PM  [facility] [address] [place], [place], [address] Phone: [phone]   Fax:  [phone]  Name: ARI BERNABEI MRN: [id number] Date of Birth: 10-Jul-1946

## 2018-03-04 NOTE — Telephone Encounter (Signed)
Spoke with pt regarding labs. Platelet count of 101. Per Dr. [name], pt to begin Xarelto. Informed pt that prescription has been sent in to pharmacy. Pt voiced understanding and knows to pick up and begin today.

## 2018-03-05 NOTE — Progress Notes (Signed)
FMLA successfully faxed to Richelle Ito at [phone]. Mailed copy to patient address on file.

## 2018-03-06 ENCOUNTER — Ambulatory Visit (INDEPENDENT_AMBULATORY_CARE_PROVIDER_SITE_OTHER): Payer: Medicare Other | Admitting: Primary Care

## 2018-03-06 ENCOUNTER — Encounter: Payer: Self-pay | Admitting: Primary Care

## 2018-03-06 DIAGNOSIS — R1310 Dysphagia, unspecified: Secondary | ICD-10-CM | POA: Diagnosis not present

## 2018-03-06 DIAGNOSIS — J90 Pleural effusion, not elsewhere classified: Secondary | ICD-10-CM

## 2018-03-06 DIAGNOSIS — I2782 Chronic pulmonary embolism: Secondary | ICD-10-CM | POA: Diagnosis not present

## 2018-03-06 DIAGNOSIS — C159 Malignant neoplasm of esophagus, unspecified: Secondary | ICD-10-CM

## 2018-03-06 DIAGNOSIS — J438 Other emphysema: Secondary | ICD-10-CM | POA: Diagnosis not present

## 2018-03-06 MED ORDER — FLUTICASONE FUROATE-VILANTEROL 100-25 MCG/INH IN AEPB: 1.0000 | INHALATION_SPRAY | Freq: Every day | RESPIRATORY_TRACT | 11 refills | Status: AC

## 2018-03-06 NOTE — Assessment & Plan Note (Signed)
-   Follows with hem/onc, seen recently on 7/15 - Planning for 3rd round of chemo schedule for Monday 7/22

## 2018-03-06 NOTE — Progress Notes (Addendum)
@Patient  ID: Ronald Johnson, male    DOB: Feb 19, 1946, 71 y.o.   MRN: [id number]  Chief Complaint  Patient presents with  . Hospitalization Follow-up    states he has been very fatigued. breathing has been better, concerned about weight loss. Currently on augmentin 4 more days left, currently undergoing chemo for esophageal cancer.     Referring provider: [name], MD  HPI: 72 year old male, former smoker quit in 2010.  History of hypertension, PE, diastolic heart failure, emphysema, COPD, pleural effusion on the right side, pulmonary nodule, esophageal cancer(on chemo therapy).  Patient of Dr. Elsworth Soho, last seen by Rexene Edison on May 2016 for COPD and was started on Breo.  Completed pulmonary rehab in 2013.  Breathing has been stable for the past few years.  Recent hospital admit at [facility] from 7/9-7/12 for septic shock and severe sepsis likely related to Right pleural effusion.  Patient called EMS for fever and shortness of breath, temp of 103.1.  He received 4 L of IV fluids, was started on IV steroids and empiric antibiotics. Transferred to ICU.  He needed levophed for hypotension.  He was pancultured and antibiotics were changed to IV Zosyn. Pressors were weaned off.  The patient did not want any invasive procedures, pleural effusion was not tapped.  He was discharged on oral Augmentin for an additional 10 days (total of 2 weeks).    03/06/2018 Presents today for hospital follow-up.  Patient feels well, his breathing is better than while he was in the hospital.  He does feel weak, continues 2 L home O2 which is new.  He saw his oncologist on Monday and was started on Xarelto for PE.  She will have chemo on Monday 03/09/18, planning on 2 more rounds to complete 5 total.  He has a congested cough and sleeps in a recliner.  Difficult to bring up mucus.  He has 4 days left of Augmentin.  Reports that his swallowing is better. Denies fever or increasing shortness of breath.   He will have PT  and OT for the next 8 weeks.      No Known Allergies  Immunization History  Administered Date(s) Administered  . Influenza Split 05/14/2011, 05/11/2012  . Influenza Whole 05/19/2008, 06/19/2009, 06/18/2010  . Influenza, High Dose Seasonal PF 05/30/2016, 06/10/2017  . Influenza,inj,Quad PF,6+ Mos 08/03/2013, 09/19/2014, 08/05/2015  . Pneumococcal Conjugate-13 09/08/2013  . Pneumococcal Polysaccharide-23 06/20/2003, 08/19/2008, 08/05/2015  . Td 06/20/2003  . Zoster 08/02/2011    Past Medical History:  Diagnosis Date  . Anemia 08/15/2009  . Anxiety   . Arthritis   . [name]'s esophagus 2007  . COLONIC POLYPS, ADENOMATOUS 08/01/2008, 2013, 2014  . COPD (chronic obstructive pulmonary disease) (HCC)    no per pt  . Depression   . Diabetes type 2, controlled ([place]) 03/13/2007  . Diverticulitis   . ED (erectile dysfunction)   . EMPHYSEMA 11/08/2008   no per pt  . Esophageal cancer ([place]) "dx'd ~ 01/2015"  . GERD 03/13/2007  . GIB (gastrointestinal bleeding) 08/18/2015  . Hiatal hernia   . History of blood transfusion 08/18/2015   due to GIB  . HYPERCHOLESTEROLEMIA 02/03/2008  . HYPERTENSION 03/13/2007   pt denies, claims white coat syndrome  . Pneumonia   . Prostatism   . PULMONARY NODULE 11/24/2008  . Stomach cancer ([place]) "dx'd ~ 01/2015"  . TOBACCO ABUSE 11/24/2008    Tobacco History: Social History   Tobacco Use  Smoking Status Former Smoker  . Packs/day:  1.00  . Years: 35.00  . Pack years: 35.00  . Types: Cigarettes  . Last attempt to quit: 10/31/2008  . Years since quitting: 9.3  Smokeless Tobacco Never Used   Counseling given: Not Answered   Outpatient Medications Prior to Visit  Medication Sig Dispense Refill  . albuterol (PROVENTIL HFA;VENTOLIN HFA) 108 (90 Base) MCG/ACT inhaler Inhale 2 puffs into the lungs every 6 (six) hours as needed for wheezing or shortness of breath. 1 Inhaler 2  . amoxicillin-clavulanate (AUGMENTIN) 875-125 MG tablet Take 1 tablet by  mouth every 12 (twelve) hours for 10 days. 20 tablet 0  . diazepam (VALIUM) 5 MG tablet Take 5 mg by mouth every 6 (six) hours as needed for anxiety.    Marland Kitchen escitalopram (LEXAPRO) 20 MG tablet Take 1 tablet (20 mg total) by mouth daily. 90 tablet 3  . glucagon (GLUCAGON EMERGENCY) 1 MG injection Inject 1 mg into the vein once as needed for up to 1 dose. (Patient taking differently: Inject 1 mg into the vein once as needed (for low blood sugars). ) 1 each 12  . glucose blood (PRECISION XTRA TEST STRIPS) test strip Check blood sugar three times a day dx 250.01 270 each 3  . insulin NPH-regular Human (HUMULIN 70/30) (70-30) 100 UNIT/ML injection Inject 70 Units into the skin daily with breakfast. (Patient taking differently: Inject 70-100 Units into the skin daily with breakfast. Dose per sliding scale) 3 vial 11  . Insulin Syringe-Needle U-100 30G X 3/8" 1 ML MISC Use to inject insulin 3 times daily. 300 each 1  . ketotifen (ZADITOR) 0.025 % ophthalmic solution Place 1 drop into both eyes 2 (two) times daily as needed (pain).     Marland Kitchen lidocaine-prilocaine (EMLA) cream Apply to Port-A-Cath site 1 hour prior to use 30 g 2  . oxyCODONE (OXY IR/ROXICODONE) 5 MG immediate release tablet Take 1 tablet (5 mg total) by mouth daily as needed for severe pain. 30 tablet 0  . prochlorperazine (COMPAZINE) 5 MG tablet Take 1 tablet (5 mg total) by mouth every 6 (six) hours as needed for nausea or vomiting. 30 tablet 2  . RABEprazole (ACIPHEX) 20 MG tablet TAKE 1 TABLET(20 MG) BY MOUTH TWICE DAILY 180 tablet 1  . rivaroxaban (XARELTO) 20 MG TABS tablet Take 1 tablet (20 mg total) by mouth daily with supper. 30 tablet 0  . sildenafil (REVATIO) 20 MG tablet 1-5 tabs as needed for ED symptoms 100 tablet 11  . sucralfate (CARAFATE) 1 g tablet Take 1 tablet (1 g total) by mouth 4 (four) times daily -  with meals and at bedtime. Crush and mix in 1 oz of water to take (Patient taking differently: Take 1 g by mouth 3 (three) times  daily as needed. Crush and mix in 1 oz of water to take) 120 tablet 1  . topiramate (TOPAMAX) 50 MG tablet 2 in the AM, 1 at night (Patient taking differently: Take 50-100 mg by mouth See admin instructions. 2 in the AM, 1 at night) 270 tablet 1   No facility-administered medications prior to visit.       Review of Systems  Review of Systems  Constitutional: Positive for fatigue. Negative for chills and fever.  HENT: Positive for congestion.   Respiratory: Positive for cough and shortness of breath. Negative for apnea, chest tightness, wheezing and stridor.   Cardiovascular: Negative.   Gastrointestinal: Negative.   Neurological: Negative.      Physical Exam  BP 132/78  Pulse 91   Ht 6\' 4"  (1.93 m)   Wt 195 lb 6.4 oz (88.6 kg)   SpO2 97%   BMI 23.78 kg/m  Physical Exam  Constitutional: He is oriented to person, place, and time. He appears well-nourished. No distress.  Elderly gentleman, chronically ill appearing   HENT:  Head: Normocephalic and atraumatic.  Eyes: Pupils are equal, round, and reactive to light. EOM are normal.  Neck: Normal range of motion. Neck supple.  Cardiovascular: Normal rate.  Pulmonary/Chest: Effort normal.  LS with scattered rhonchi. On home oxygen 2L. NO resp distress. No wheeze.   Neurological: He is alert and oriented to person, place, and time.  Skin: Skin is warm and dry.  Psychiatric: He has a normal mood and affect. His behavior is normal.     Lab Results:  CBC    Component Value Date/Time   WBC 12.8 (H) 03/04/2018 0949   WBC 6.2 02/27/2018 0504   RBC 3.56 (L) 03/04/2018 0949   HGB 11.8 (L) 03/04/2018 0949   HGB 12.9 (L) 08/05/2017 1547   HCT 36.6 (L) 03/04/2018 0949   HCT 39.2 08/05/2017 1547   PLT 101 (L) 03/04/2018 0949   PLT 160 08/05/2017 1547   MCV 102.8 (H) 03/04/2018 0949   MCV 96.6 08/05/2017 1547   MCH 33.1 03/04/2018 0949   MCHC 32.2 03/04/2018 0949   RDW 16.2 (H) 03/04/2018 0949   RDW 13.2 08/05/2017 1547    LYMPHSABS 1.3 03/04/2018 0949   LYMPHSABS 2.5 08/05/2017 1547   MONOABS 1.5 (H) 03/04/2018 0949   MONOABS 0.6 08/05/2017 1547   EOSABS 0.0 03/04/2018 0949   EOSABS 0.4 08/05/2017 1547   BASOSABS 0.0 03/04/2018 0949   BASOSABS 0.0 08/05/2017 1547    BMET    Component Value Date/Time   NA 141 02/27/2018 0504   NA 138 08/05/2017 1547   K 3.3 (L) 02/27/2018 0504   K 4.0 08/05/2017 1547   CL 106 02/27/2018 0504   CO2 29 02/27/2018 0504   CO2 27 08/05/2017 1547   GLUCOSE 165 (H) 02/27/2018 0504   GLUCOSE 184 (H) 08/05/2017 1547   GLUCOSE 129 (H) 06/30/2006 0809   BUN 10 02/27/2018 0504   BUN 18.3 08/05/2017 1547   CREATININE 0.72 02/27/2018 0504   CREATININE 0.96 02/16/2018 0824   CREATININE 1.3 08/05/2017 1547   CALCIUM 8.5 (L) 02/27/2018 0504   CALCIUM 9.5 08/05/2017 1547   GFRNONAA >60 02/27/2018 0504   GFRNONAA >60 02/16/2018 0824   GFRAA >60 02/27/2018 0504   GFRAA >60 02/16/2018 0824    BNP No results found for: BNP  ProBNP    Component Value Date/Time   PROBNP 181.0 (H) 10/31/2008 2116    Imaging: Ct Angio Chest Pe W/cm &/or Wo Cm  Result Date: 02/24/2018 CLINICAL DATA:  Shortness of breath. History of esophageal and thyroid cancer. EXAM: CT ANGIOGRAPHY CHEST WITH CONTRAST TECHNIQUE: Multidetector CT imaging of the chest was performed using the standard protocol during bolus administration of intravenous contrast. Multiplanar CT image reconstructions and MIPs were obtained to evaluate the vascular anatomy. CONTRAST:  171mL ISOVUE-370 IOPAMIDOL (ISOVUE-370) INJECTION 76% COMPARISON:  Radiograph of same day.  CT scan of Dec 23, 2017. FINDINGS: Cardiovascular: Small filling defects seen into lower lobe branches of the left pulmonary artery concerning for small peripheral pulmonary emboli. Atherosclerosis of thoracic aorta is noted without aneurysm or dissection. Left-sided Port-A-Cath is again noted. Probable coronary artery calcifications are noted. No pericardial  effusion is noted. Normal cardiac size.  Mediastinum/Nodes: Status post gastric pull-through procedure for treatment of esophageal cancer. Continued presence of abnormal soft tissue density in the right thoracic inlet which is grossly unchanged in size or appearance compared to prior exam. This surrounds the proximal right subclavian and carotid arteries. This mass also extends to the right thyroid lobe. No definite adenopathy is noted. Lungs/Pleura: Interval development of large loculated right pleural effusion is noted. Adjacent subsegmental atelectasis is noted in the right lower lobe. 9 mm nodule is noted in right lower lobe which was present on prior exam. No pneumothorax is noted. Bulla formation is noted in both lung apices. Stable 17 x 8 mm nodular density is noted laterally in left lower lobe. Upper Abdomen: No acute abnormality. Musculoskeletal: Stable sclerotic lesion is seen in T1 vertebral body consistent with metastatic disease. No other significant osseous abnormality is noted. Review of the MIP images confirms the above findings. IMPRESSION: Small filling defects are seen in 2 lower lobe branches of the left pulmonary artery concerning for small peripheral pulmonary emboli. Status post gastric pull-through procedure for treatment of esophageal cancer. Stable abnormal soft tissue density seen in right thoracic inlet concerning for malignancy. Interval development of large loculated right pleural effusion with adjacent subsegmental atelectasis of right lower lobe. Stable bilateral pulmonary nodules are noted as described above. Stable sclerotic lesion seen in T1 vertebral body most consistent with metastatic disease. Aortic Atherosclerosis (ICD10-I70.0). Electronically Signed   By: Marijo Conception, M.D.   On: 02/24/2018 13:17   Dg Chest Port 1 View  Result Date: 02/24/2018 CLINICAL DATA:  Esophageal malignancy with most recent chemotherapy last week. Patient is febrile with shortness of breath and  decreased oxygen saturation. EXAM: PORTABLE CHEST 1 VIEW COMPARISON:  CT scan of the chest of Dec 23, 2017 FINDINGS: The lungs are well-expanded. There is increased interstitial density on the right consistent with posterior layering of a moderate-sized pleural effusion. The pulmonary interstitium is also more conspicuous on the right than on the previous study. The left lung is well-expanded and clear. The heart and pulmonary vascularity are normal. The power port catheter tip projects over the distal third of the SVC. IMPRESSION: Interval further accumulation of pleural fluid on the right which is layering posteriorly. Mildly increased interstitial markings on the right as compared to the left may reflect asymmetric pulmonary edema of cardiac or noncardiac cause. It could also reflect lymphatic obstruction. Repeat chest CT scanning is recommended. Thoracic aortic atherosclerosis. Electronically Signed   By: David  Martinique M.D.   On: 02/24/2018 12:33     Assessment & Plan:  72 year old male, recently admitted to [facility] long hospital for sepsis and septic shock felt to be related to right pleural effusion.  Brief ICU stays requiring pressors, fluid and IV steroids. He did not want invasive procedures and declined thoracentesis.  IV zosyn was changed to oral Augmentin for 2 weeks.  Presents today for hospital follow-up, he appears well.  Afebrile.  Some upper airway congestion, that he has trouble expectorating.  Recommend p.o. Mucinex max and flutter valve use.  Patient saw oncology 4 days ago, was started on Xarelto for PE.  Plans to resume chemotherapy on Monday 7/22. He will start PT and OT next week.  Plan to follow-up with Dr. Elsworth Soho in 2 to 3 weeks, likely will need to repeat chest x-ray to evaluate pleural effusion and perform ambulatory walking O2 sat to determine whether or not he will continue to need home oxygen.  He is to call us  if he becomes more short of breath or his oxygen demand goes up.     Pulmonary embolism (HCC) - On xarelto   Pleural effusion on right - Hospital admission at [facility] from 7/9-7/12 for sepsis and septic shock, felt to be r/t new right pleural effusion - Continues Augmentin (2 week therapy) and 2L home oxygen  - Patient did not want any invasive procedures, declined thoracentesis - FU in 2-3 week with Dr. Elsworth Soho, would consider ambulatory 02 test at that time to see if patient requires continued oxygen use or if it can be discontinued - I would consider CXR on follow-up visit to evaluate effusion once abx completed   Esophageal cancer ([place]) - Follows with hem/onc, seen recently on 7/15 - Planning for 3rd round of chemo schedule for Monday 7/22   Dysphagia - Patient reports improved swallowing - Reviewed aspiration risk and precautions - Enc soft foods   EMPHYSEMA Hx COPD, plan restart previous BREO inhaler      Martyn Ehrich, NP 03/06/2018

## 2018-03-06 NOTE — Patient Instructions (Addendum)
Mucinex twice daily with water for chest congestion  Flutter valve 5-10 puffs three times a day Continue antibiotic until complete, continue home oxygen Focus on high protein diet, shakes Follow up in 2-3 weeks with Dr. Elsworth Soho preferably but can see me  Call or return if oxygen demands goes up or becomes more short of breath

## 2018-03-06 NOTE — Assessment & Plan Note (Signed)
On xarelto 

## 2018-03-06 NOTE — Assessment & Plan Note (Signed)
-   Patient reports improved swallowing - Reviewed aspiration risk and precautions - Enc soft foods

## 2018-03-06 NOTE — Assessment & Plan Note (Signed)
Hx COPD, plan restart previous BREO inhaler

## 2018-03-06 NOTE — Assessment & Plan Note (Addendum)
-   Hospital admission at [facility] from 7/9-7/12 for sepsis and septic shock, felt to be r/t new right pleural effusion - Continues Augmentin (2 week therapy) and 2L home oxygen  - Patient did not want any invasive procedures, declined thoracentesis - FU in 2-3 week with Dr. Elsworth Soho, would consider ambulatory 02 test at that time to see if patient requires continued oxygen use or if it can be discontinued - I would consider CXR on follow-up visit to evaluate effusion once abx completed

## 2018-03-09 ENCOUNTER — Inpatient Hospital Stay: Payer: Medicare Other

## 2018-03-09 ENCOUNTER — Encounter: Payer: Self-pay | Admitting: Nurse Practitioner

## 2018-03-09 ENCOUNTER — Inpatient Hospital Stay (HOSPITAL_BASED_OUTPATIENT_CLINIC_OR_DEPARTMENT_OTHER): Payer: Medicare Other | Admitting: Nurse Practitioner

## 2018-03-09 ENCOUNTER — Telehealth: Payer: Self-pay | Admitting: Nurse Practitioner

## 2018-03-09 VITALS — BP 114/68 | HR 76 | Temp 98.0°F | Resp 17 | Ht 76.0 in | Wt 193.5 lb

## 2018-03-09 DIAGNOSIS — R0609 Other forms of dyspnea: Secondary | ICD-10-CM | POA: Diagnosis not present

## 2018-03-09 DIAGNOSIS — Z8719 Personal history of other diseases of the digestive system: Secondary | ICD-10-CM

## 2018-03-09 DIAGNOSIS — R1310 Dysphagia, unspecified: Secondary | ICD-10-CM

## 2018-03-09 DIAGNOSIS — C159 Malignant neoplasm of esophagus, unspecified: Secondary | ICD-10-CM

## 2018-03-09 DIAGNOSIS — C160 Malignant neoplasm of cardia: Secondary | ICD-10-CM | POA: Diagnosis not present

## 2018-03-09 DIAGNOSIS — Z9981 Dependence on supplemental oxygen: Secondary | ICD-10-CM

## 2018-03-09 DIAGNOSIS — Z7901 Long term (current) use of anticoagulants: Secondary | ICD-10-CM | POA: Diagnosis not present

## 2018-03-09 DIAGNOSIS — Z95828 Presence of other vascular implants and grafts: Secondary | ICD-10-CM

## 2018-03-09 DIAGNOSIS — I10 Essential (primary) hypertension: Secondary | ICD-10-CM

## 2018-03-09 DIAGNOSIS — Z79899 Other long term (current) drug therapy: Secondary | ICD-10-CM

## 2018-03-09 DIAGNOSIS — Z86711 Personal history of pulmonary embolism: Secondary | ICD-10-CM

## 2018-03-09 DIAGNOSIS — D6481 Anemia due to antineoplastic chemotherapy: Secondary | ICD-10-CM | POA: Diagnosis not present

## 2018-03-09 DIAGNOSIS — J90 Pleural effusion, not elsewhere classified: Secondary | ICD-10-CM | POA: Diagnosis not present

## 2018-03-09 DIAGNOSIS — I2699 Other pulmonary embolism without acute cor pulmonale: Secondary | ICD-10-CM | POA: Diagnosis not present

## 2018-03-09 DIAGNOSIS — F329 Major depressive disorder, single episode, unspecified: Secondary | ICD-10-CM

## 2018-03-09 DIAGNOSIS — E1165 Type 2 diabetes mellitus with hyperglycemia: Secondary | ICD-10-CM

## 2018-03-09 DIAGNOSIS — J449 Chronic obstructive pulmonary disease, unspecified: Secondary | ICD-10-CM | POA: Diagnosis not present

## 2018-03-09 DIAGNOSIS — Z923 Personal history of irradiation: Secondary | ICD-10-CM | POA: Diagnosis not present

## 2018-03-09 DIAGNOSIS — T451X5S Adverse effect of antineoplastic and immunosuppressive drugs, sequela: Secondary | ICD-10-CM

## 2018-03-09 DIAGNOSIS — D61810 Antineoplastic chemotherapy induced pancytopenia: Secondary | ICD-10-CM | POA: Diagnosis not present

## 2018-03-09 DIAGNOSIS — Z452 Encounter for adjustment and management of vascular access device: Secondary | ICD-10-CM | POA: Diagnosis not present

## 2018-03-09 DIAGNOSIS — Z5111 Encounter for antineoplastic chemotherapy: Secondary | ICD-10-CM | POA: Diagnosis not present

## 2018-03-09 LAB — CMP (CANCER CENTER ONLY)
ALBUMIN: 3.1 g/dL — AB (ref 3.5–5.0)
ALT: 7 U/L (ref 0–44)
ANION GAP: 7 (ref 5–15)
AST: 11 U/L — ABNORMAL LOW (ref 15–41)
Alkaline Phosphatase: 88 U/L (ref 38–126)
BUN: 17 mg/dL (ref 8–23)
CHLORIDE: 107 mmol/L (ref 98–111)
CO2: 30 mmol/L (ref 22–32)
Calcium: 9.6 mg/dL (ref 8.9–10.3)
Creatinine: 0.9 mg/dL (ref 0.61–1.24)
GFR, Estimated: >60 mL/min (ref >60)
Glucose, Bld: 138 mg/dL — ABNORMAL HIGH (ref 70–99)
POTASSIUM: 3.4 mmol/L — AB (ref 3.5–5.1)
SODIUM: 144 mmol/L (ref 135–145)
Total Bilirubin: 0.3 mg/dL (ref 0.3–1.2)
Total Protein: 6.9 g/dL (ref 6.5–8.1)

## 2018-03-09 LAB — CBC WITH DIFFERENTIAL (CANCER CENTER ONLY)
BASOS PCT: 0 %
Basophils Absolute: 0 10*3/uL (ref 0.0–0.1)
Eosinophils Absolute: 0.1 10*3/uL (ref 0.0–0.5)
Eosinophils Relative: 2 %
HEMATOCRIT: 34.7 % — AB (ref 38.4–49.9)
HEMOGLOBIN: 11.2 g/dL — AB (ref 13.0–17.1)
Lymphocytes Relative: 16 %
Lymphs Abs: 1.1 10*3/uL (ref 0.9–3.3)
MCH: 33.4 pg (ref 27.2–33.4)
MCHC: 32.3 g/dL (ref 32.0–36.0)
MCV: 103.6 fL — ABNORMAL HIGH (ref 79.3–98.0)
MONOS PCT: 8 %
Monocytes Absolute: 0.6 10*3/uL (ref 0.1–0.9)
NEUTROS ABS: 5.3 10*3/uL (ref 1.5–6.5)
NEUTROS PCT: 74 %
Platelet Count: 155 10*3/uL (ref 140–400)
RBC: 3.35 MIL/uL — AB (ref 4.20–5.82)
RDW: 16.1 % — ABNORMAL HIGH (ref 11.0–14.6)
WBC: 7.2 10*3/uL (ref 4.0–10.3)

## 2018-03-09 LAB — CEA (IN HOUSE-CHCC): CEA (CHCC-In House): 16.05 ng/mL — ABNORMAL HIGH (ref 0.00–5.00)

## 2018-03-09 MED ORDER — DEXAMETHASONE SODIUM PHOSPHATE 10 MG/ML IJ SOLN
INTRAMUSCULAR | Status: AC
  Filled 2018-03-09: qty 1

## 2018-03-09 MED ORDER — DEXAMETHASONE SODIUM PHOSPHATE 10 MG/ML IJ SOLN
10.0000 mg | Freq: Once | INTRAMUSCULAR | Status: AC
  Administered 2018-03-09: 10 mg via INTRAVENOUS

## 2018-03-09 MED ORDER — SODIUM CHLORIDE 0.9 % IV SOLN
2400.0000 mg/m2 | INTRAVENOUS | Status: DC
  Administered 2018-03-09: 5400 mg via INTRAVENOUS
  Filled 2018-03-09: qty 108

## 2018-03-09 MED ORDER — LEUCOVORIN CALCIUM INJECTION 350 MG
400.0000 mg/m2 | Freq: Once | INTRAVENOUS | Status: AC
  Administered 2018-03-09: 896 mg via INTRAVENOUS
  Filled 2018-03-09: qty 44.8

## 2018-03-09 MED ORDER — DEXTROSE 5 % IV SOLN
Freq: Once | INTRAVENOUS | Status: AC
  Administered 2018-03-09: 12:00:00 via INTRAVENOUS

## 2018-03-09 MED ORDER — SODIUM CHLORIDE 0.9% FLUSH
10.0000 mL | INTRAVENOUS | Status: DC | PRN
  Administered 2018-03-09: 10 mL
  Filled 2018-03-09: qty 10

## 2018-03-09 MED ORDER — PALONOSETRON HCL INJECTION 0.25 MG/5ML
0.2500 mg | Freq: Once | INTRAVENOUS | Status: AC
  Administered 2018-03-09: 0.25 mg via INTRAVENOUS

## 2018-03-09 MED ORDER — PALONOSETRON HCL INJECTION 0.25 MG/5ML
INTRAVENOUS | Status: AC
  Filled 2018-03-09: qty 5

## 2018-03-09 MED ORDER — FLUOROURACIL CHEMO INJECTION 2.5 GM/50ML
400.0000 mg/m2 | Freq: Once | INTRAVENOUS | Status: AC
  Administered 2018-03-09: 900 mg via INTRAVENOUS
  Filled 2018-03-09: qty 18

## 2018-03-09 MED ORDER — OXALIPLATIN CHEMO INJECTION 100 MG/20ML
45.0000 mg/m2 | Freq: Once | INTRAVENOUS | Status: AC
  Administered 2018-03-09: 100 mg via INTRAVENOUS
  Filled 2018-03-09: qty 20

## 2018-03-09 NOTE — Patient Instructions (Signed)
[facility] Discharge Instructions for Patients Receiving Chemotherapy  Today you received the following chemotherapy agents :  Oxaliplatin,  Leucovorin,  Fluorouracil.  To help prevent nausea and vomiting after your treatment, we encourage you to take your nausea medication as prescribed.   If you develop nausea and vomiting that is not controlled by your nausea medication, call the clinic.   BELOW ARE SYMPTOMS THAT SHOULD BE REPORTED IMMEDIATELY:  *FEVER GREATER THAN 100.5 F  *CHILLS WITH OR WITHOUT FEVER  NAUSEA AND VOMITING THAT IS NOT CONTROLLED WITH YOUR NAUSEA MEDICATION  *UNUSUAL SHORTNESS OF BREATH  *UNUSUAL BRUISING OR BLEEDING  TENDERNESS IN MOUTH AND THROAT WITH OR WITHOUT PRESENCE OF ULCERS  *URINARY PROBLEMS  *BOWEL PROBLEMS  UNUSUAL RASH Items with * indicate a potential emergency and should be followed up as soon as possible.  Feel free to call the clinic should you have any questions or concerns. The clinic phone number is [phone].  Please show the CHEMO ALERT CARD at check-in to the Emergency Department and triage nurse.   

## 2018-03-09 NOTE — Progress Notes (Signed)
[facility] OFFICE PROGRESS NOTE   Diagnosis: Esophagus cancer  INTERVAL HISTORY:   [name] returns as scheduled.  He completed cycle 3 FOLFOX 02/16/2018.  Treatment was held last week following a hospitalization.  He is feeling better.  He notes both appetite and dysphagia have improved.  He denies nausea/vomiting.  No recent diarrhea.  No numbness or tingling in his hands or feet.  He reports he will be beginning physical and occupational therapy this week.  He continues Xarelto.  No change in dyspnea on exertion.  He is utilizing supplemental oxygen at 2 L/min.  Objective:  Vital signs in last 24 hours:  Blood pressure 114/68, pulse 76, temperature 98 F (36.7 C), temperature source Oral, resp. rate 17, height 6\' 4"  (1.93 m), weight 193 lb 8 oz (87.8 kg), SpO2 94 %.    HEENT: No thrush or ulcers. Lymphatics: No palpable cervical or supraclavicular lymph nodes. Resp: Diminished breath sounds at the right lower lung field.  No respiratory distress. Cardio: Regular rate and rhythm. GI: Abdomen soft and nontender.  No hepatomegaly. Vascular: No leg edema. Port-A-Cath without erythema.  Lab Results:  Lab Results  Component Value Date   WBC 7.2 03/09/2018   HGB 11.2 (L) 03/09/2018   HCT 34.7 (L) 03/09/2018   MCV 103.6 (H) 03/09/2018   PLT 155 03/09/2018   NEUTROABS 5.3 03/09/2018    Imaging:  No results found.  Medications: I have reviewed the patient's current medications.  Assessment/Plan: 1. 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 EUS 03/16/2015 confirmed a clinical stage IIb (uT3,uN1) tumor ? Staging CTs of the chest, abdomen, and pelvis with no evidence of metastatic disease ? Initiation of radiation 04/19/2015, cycle 1 Taxol/carboplatin 04/20/2015: Radiation completed 05/29/2015; the fifth and final week of Taxol/carboplatin 05/18/2015 ? Restaging CTs 07/20/2015 revealed no evidence of metastatic disease,  incidental left lower lobe pulmonary embolism ? Transhiatal total esophagectomy 07/25/2015 confirmed a ypT3, ypN2 with 4/5 positive lymph nodes, lymphovascular invasion, perineural invasion, negative resection margins ? PD1 score-0 ? CT neck 07/25/2017-4-5 cm mass in the right lower neck upper mediastinum immediately adjacent to the esophageal anastomosis. 12 mm mass within the right parotid enlarged since the previous PET study where it was about half that size. Question few small pulmonary nodules. Borderline enlarged mediastinal lymph nodes. ? CTs brain/chest/abdomen/pelvis 08/06/2017-ill-defined mass at the right thoracic inlet extending into the right superior mediastinum; new bilateral but right much greater than left peripheral reticulonodular and groundglass opacities; scattered small paratracheal and anterior mediastinal lymph nodes; small bilateral axillary lymph nodes. Brain CT with no evidence of metastasis. ? 08/26/2017 biopsy right supraclavicular lymph node-very scant fragment of benign lymph node. ? PET scan 09/11/2017-poorly marginated heterogeneously hypermetabolic soft tissue mass at the right thoracic inlet extending laterally from the esophagogastric anastomosis in the neck. No definite hypermetabolic metastatic disease.Patchy tree-in-bud opacities throughout the lungs significantly decreased. Persistent scattered subcentimeter solid pulmonary nodules in both lungs stable to decreased. Stable irregular 1.7 cm nodular bandlike opacity anterior left lower lobe with associated low-level metabolism. Nonspecific mildly hypermetabolic subcarinal and bilateral hilar lymph nodes. Nonspecific mildly hypermetabolic nodular focus of soft tissue in the upper left retroperitoneum anterior to the adrenal gland. ? Upper endoscopy 09/15/2017-at 20 cm from the incisors tight stricture at the anastomosis measuring only a few millimeters in diameter. Surrounding tissues firm, slightly friable,  noncompliant. Stricture was dilated with a 15 mm balloon several times. Diameter of stricture improved but still would not permit  passage of a standard upper endoscope. Friable areas biopsied-invasive adenocarcinoma. ? Initiation of radiation and concurrent capecitabine 2/4/2019completed 10/30/2017 ? CTs neck and chest 12/23/2017-stable abnormal soft tissue at the right thoracic inlet with encasement of the proximal right common carotid artery, interval development of sclerotic lesions at C7 and T1, new indeterminate right lung opacities ? Cycle 1 FOLFOX 01/08/2018 ? Cycle 2 FOLFOX 02/02/2018 (Oxaliplatin dose reduced due to neutropenia) ? Cycle 3 FOLFOX 02/16/2018, white cell growth factor support added ? CT 02/24/2018- progressive loculated right effusion, stable lung nodules, stable thoracic inlet soft tissue, small peripheral pulmonary emboli ? Cycle 4 FOLFOX 03/09/2018  2. Postprandial subxiphoid pain secondary to #1  3. Diabetes-elevated blood sugar readings at the [facility] and at home  4. COPD  5. Depression-Improved with Wellbutrin  6. Hypertension  7. History of adenomatous colon polyps  8. History of diabetic related neuropathy, peripheral arterial disease, and nephropathy  9. History of nausea-likely related to radiation esophagitis/gastritis  10. Incidental pulmonary embolism noted on the CT 07/20/2015, treated with Xarelto  11. Admission 08/18/2015 with GI bleeding while on Xarelto, anticoagulation therapy discontinued  12. Dysphagia-improved with esophageal dilatation procedures by Dr. Henrene Pastor, progressive dysphagia secondary to local recurrence of esophagus cancer with obstruction at the esophagus anastomosis  13. Pancytopenia secondary to chemotherapy  14. CT scan abdomen/pelvis 10/09/2016 (done to evaluate persistent nausea, epigastric pain)-no acute findings or explanation for the patient's symptoms. Resolving postsurgical changes from  presumed previous esophagectomy and gastric pull-through. No evidence of metastatic disease.  15. Admission 02/24/2018 with sepsis syndrome- probable pulmonary source for infection with a right effusion, cultures negative   16. Incidental pulmonary emboli noted on CT 02/24/2018, Xarelto started 03/02/2018   Disposition: Mr. Stepter appears stable.  He has completed 3 cycles of FOLFOX.  Plan to proceed with cycle 4 today as scheduled.  We reviewed the CBC from today.  Counts are adequate for treatment.  He will return for lab, follow-up and cycle 5 FOLFOX in 2 weeks.  He will contact the office in the interim with any problems.    Ned Card ANP/GNP-BC   03/09/2018  12:12 PM

## 2018-03-09 NOTE — Telephone Encounter (Signed)
Scheduled appt per 7/22 los - gave patient aVS and calender per los.  

## 2018-03-11 ENCOUNTER — Inpatient Hospital Stay (HOSPITAL_BASED_OUTPATIENT_CLINIC_OR_DEPARTMENT_OTHER): Payer: Medicare Other

## 2018-03-11 VITALS — BP 122/70 | HR 77 | Temp 98.1°F | Resp 20

## 2018-03-11 DIAGNOSIS — J449 Chronic obstructive pulmonary disease, unspecified: Secondary | ICD-10-CM

## 2018-03-11 DIAGNOSIS — R0609 Other forms of dyspnea: Secondary | ICD-10-CM

## 2018-03-11 DIAGNOSIS — D6481 Anemia due to antineoplastic chemotherapy: Secondary | ICD-10-CM | POA: Diagnosis not present

## 2018-03-11 DIAGNOSIS — R1310 Dysphagia, unspecified: Secondary | ICD-10-CM | POA: Diagnosis not present

## 2018-03-11 DIAGNOSIS — Z86711 Personal history of pulmonary embolism: Secondary | ICD-10-CM

## 2018-03-11 DIAGNOSIS — D61810 Antineoplastic chemotherapy induced pancytopenia: Secondary | ICD-10-CM

## 2018-03-11 DIAGNOSIS — E1165 Type 2 diabetes mellitus with hyperglycemia: Secondary | ICD-10-CM

## 2018-03-11 DIAGNOSIS — Z79899 Other long term (current) drug therapy: Secondary | ICD-10-CM

## 2018-03-11 DIAGNOSIS — J90 Pleural effusion, not elsewhere classified: Secondary | ICD-10-CM | POA: Diagnosis not present

## 2018-03-11 DIAGNOSIS — R05 Cough: Secondary | ICD-10-CM

## 2018-03-11 DIAGNOSIS — I2699 Other pulmonary embolism without acute cor pulmonale: Secondary | ICD-10-CM

## 2018-03-11 DIAGNOSIS — Z923 Personal history of irradiation: Secondary | ICD-10-CM | POA: Diagnosis not present

## 2018-03-11 DIAGNOSIS — Z7901 Long term (current) use of anticoagulants: Secondary | ICD-10-CM | POA: Diagnosis not present

## 2018-03-11 DIAGNOSIS — C160 Malignant neoplasm of cardia: Secondary | ICD-10-CM

## 2018-03-11 DIAGNOSIS — T451X5S Adverse effect of antineoplastic and immunosuppressive drugs, sequela: Secondary | ICD-10-CM

## 2018-03-11 DIAGNOSIS — Z8719 Personal history of other diseases of the digestive system: Secondary | ICD-10-CM

## 2018-03-11 DIAGNOSIS — F329 Major depressive disorder, single episode, unspecified: Secondary | ICD-10-CM

## 2018-03-11 DIAGNOSIS — I10 Essential (primary) hypertension: Secondary | ICD-10-CM

## 2018-03-11 DIAGNOSIS — Z5111 Encounter for antineoplastic chemotherapy: Secondary | ICD-10-CM | POA: Diagnosis not present

## 2018-03-11 DIAGNOSIS — C159 Malignant neoplasm of esophagus, unspecified: Secondary | ICD-10-CM

## 2018-03-11 DIAGNOSIS — Z452 Encounter for adjustment and management of vascular access device: Secondary | ICD-10-CM | POA: Diagnosis not present

## 2018-03-11 DIAGNOSIS — Z9981 Dependence on supplemental oxygen: Secondary | ICD-10-CM

## 2018-03-11 MED ORDER — SODIUM CHLORIDE 0.9% FLUSH
10.0000 mL | INTRAVENOUS | Status: DC | PRN
  Administered 2018-03-11: 10 mL
  Filled 2018-03-11: qty 10

## 2018-03-11 MED ORDER — HEPARIN SOD (PORK) LOCK FLUSH 100 UNIT/ML IV SOLN
500.0000 [IU] | Freq: Once | INTRAVENOUS | Status: AC | PRN
  Administered 2018-03-11: 500 [IU]
  Filled 2018-03-11: qty 5

## 2018-03-11 MED ORDER — PEGFILGRASTIM-CBQV 6 MG/0.6ML ~~LOC~~ SOSY
PREFILLED_SYRINGE | SUBCUTANEOUS | Status: AC
  Filled 2018-03-11: qty 0.6

## 2018-03-11 MED ORDER — PEGFILGRASTIM-CBQV 6 MG/0.6ML ~~LOC~~ SOSY
6.0000 mg | PREFILLED_SYRINGE | Freq: Once | SUBCUTANEOUS | Status: AC
  Administered 2018-03-11: 6 mg via SUBCUTANEOUS

## 2018-03-12 ENCOUNTER — Ambulatory Visit: Payer: Medicare Other | Admitting: Occupational Therapy

## 2018-03-12 ENCOUNTER — Ambulatory Visit: Payer: Medicare Other | Admitting: Physical Therapy

## 2018-03-12 DIAGNOSIS — M6281 Muscle weakness (generalized): Secondary | ICD-10-CM | POA: Diagnosis not present

## 2018-03-12 DIAGNOSIS — R2681 Unsteadiness on feet: Secondary | ICD-10-CM

## 2018-03-12 DIAGNOSIS — M25611 Stiffness of right shoulder, not elsewhere classified: Secondary | ICD-10-CM

## 2018-03-12 DIAGNOSIS — R5381 Other malaise: Secondary | ICD-10-CM

## 2018-03-12 DIAGNOSIS — R278 Other lack of coordination: Secondary | ICD-10-CM | POA: Diagnosis not present

## 2018-03-12 DIAGNOSIS — R2689 Other abnormalities of gait and mobility: Secondary | ICD-10-CM

## 2018-03-12 DIAGNOSIS — R41844 Frontal lobe and executive function deficit: Secondary | ICD-10-CM | POA: Diagnosis not present

## 2018-03-12 NOTE — Therapy (Signed)
[facility] [address] [place], [place], [address] Phone: [phone]   Fax:  [phone]  Occupational Therapy Treatment  Patient Details  Name: Ronald Johnson MRN: [id number] Date of Birth: [birth year] Referring Provider: Dr. Irine Seal [name], MD is PCP)   Encounter Date: 03/12/2018  OT End of Session - 03/12/18 0952    Visit Number  2    Number of Visits  17    Date for OT Re-Evaluation  05/02/18    Authorization Type  Medicare    Authorization Time Period  POC 2x week x 8 weeks, cert 90 days- to 54/00/86    Authorization - Visit Number  2    Authorization - Number of Visits  10    OT Start Time  0845    OT Stop Time  0930    OT Time Calculation (min)  45 min    Behavior During Therapy  [facility] for tasks assessed/performed       Past Medical History:  Diagnosis Date  . Anemia 08/15/2009  . Anxiety   . Arthritis   . [name]'s esophagus 2007  . COLONIC POLYPS, ADENOMATOUS 08/01/2008, 2013, 2014  . COPD (chronic obstructive pulmonary disease) (HCC)    no per pt  . Depression   . Diabetes type 2, controlled ([place]) 03/13/2007  . Diverticulitis   . ED (erectile dysfunction)   . EMPHYSEMA 11/08/2008   no per pt  . Esophageal cancer ([place]) "dx'd ~ 01/2015"  . GERD 03/13/2007  . GIB (gastrointestinal bleeding) 08/18/2015  . Hiatal hernia   . History of blood transfusion 08/18/2015   due to GIB  . HYPERCHOLESTEROLEMIA 02/03/2008  . HYPERTENSION 03/13/2007   pt denies, claims white coat syndrome  . Pneumonia   . Prostatism   . PULMONARY NODULE 11/24/2008  . Stomach cancer ([place]) "dx'd ~ 01/2015"  . TOBACCO ABUSE 11/24/2008    Past Surgical History:  Procedure Laterality Date  . BALLOON DILATION N/A 03/19/2016   Procedure: BALLOON DILATION;  Surgeon: Irene Shipper, MD;  Location: [facility];  Service: Endoscopy;  Laterality: N/A;  . CIRCUMCISION    . COLONOSCOPY    . COMPLETE ESOPHAGECTOMY N/A 07/25/2015    Procedure: TRANSHIATAL TOTAL ESOPHAGECTOMY COMPLETE PYLOROMYOTOMY;  Surgeon: [name], MD;  Location: [facility];  Service: Thoracic;  Laterality: N/A;  . EGD  11/19/2005  . ESOPHAGOGASTRODUODENOSCOPY N/A 03/19/2016   Procedure: ESOPHAGOGASTRODUODENOSCOPY (EGD);  Surgeon: Irene Shipper, MD;  Location: [facility];  Service: Endoscopy;  Laterality: N/A;  . ESOPHAGOGASTRODUODENOSCOPY (EGD) WITH PROPOFOL N/A 09/15/2017   Procedure: ESOPHAGOGASTRODUODENOSCOPY (EGD) WITH PROPOFOL;  Surgeon: Irene Shipper, MD;  Location: [facility];  Service: Endoscopy;  Laterality: N/A;  . EUS N/A 03/16/2015   Procedure: UPPER ENDOSCOPIC ULTRASOUND (EUS) RADIAL;  Surgeon: Milus Banister, MD;  Location: [facility];  Service: Endoscopy;  Laterality: N/A;  . FOOT FRACTURE SURGERY Right 1980   right foot w/ pins and screws  . IR IMAGING GUIDED PORT INSERTION  01/05/2018  . IR US GUIDE VASC ACCESS LEFT  01/05/2018  . JEJUNOSTOMY N/A 07/25/2015   Procedure: FEEDING JEJUNOSTOMY;  Surgeon: [name], MD;  Location: [facility];  Service: Thoracic;  Laterality: N/A;  . MICROLARYNGOSCOPY W/VOCAL CORD INJECTION N/A 01/22/2018   Procedure: MICROLARYNGOSCOPY WITH VOCAL CORD INJECTION;  Surgeon: Melissa Montane, MD;  Location: [facility];  Service: ENT;  Laterality: N/A;  . POLYPECTOMY    . removal pins and screws foot  1980  right foot  . UPPER GASTROINTESTINAL ENDOSCOPY    . VASECTOMY    . VIDEO BRONCHOSCOPY N/A 07/25/2015   Procedure: VIDEO BRONCHOSCOPY;  Surgeon: [name], MD;  Location[facility];  Service: Thoracic;  Laterality: N/A;    There were no vitals filed for this visit.  Subjective Assessment - 03/12/18 0850    Subjective   I still feel pretty weak    Patient is accompained by:  Family member WIFE    Pertinent History  PMH esophageal CA, undergoing chemo, hx of septic shock, pleural effusion, DM, pancytopenia, HTN, bradycardia, PE, dysphagia    Limitations  soft diet, active CA, 2L O2    Patient Stated  Goals  to get back to doing what I was doing before     Currently in Pain?  No/denies        SELF CARE: Pt/wife shown tub bench and provided handout. Wife reports he is currently doing ok using shower chair and grab bars. Suggested non skid shower mat or stickers to keep shower floor from being slippery to prevent falls - wife reports they already have shower mat. Discussed LE dressing and different ways to don/doff shoes and socks (leg over thigh, or using stool if pt gets SOB bending over). Discussed simple ways to increase endurance: short walks inside home, standing to fold laundry with chair behind pt, etc.   THERAPEUTIC ACTIVITY: Pt standing at counter to retrieve cones from shelf BUE's (Pt more reluctant to use RT dominant UE). Progressed to bilateral side stepping to place cones to lower surface each side. Pt required cues to use big step patterns and for sequencing. Pt required several rest breaks and fatigued quickly. Pt then returned cones to counter, and then replaced onto high shelf w/ cues to use both arms (vs. LUE only)                   OT Education - 03/12/18 0953    Education Details  info on tub bench (prn), simple ways to increase endurance    Person(s) Educated  Patient;Spouse    Methods  Explanation;Handout handout provided for tub bench   handout provided for tub bench   Comprehension  Verbalized understanding       OT Short Term Goals - 03/03/18 1301      OT SHORT TERM GOAL #1   Title  I with HEP for strength and coordination due 04/02/18    Time  4    Period  Weeks    Status  New    Target Date  04/02/18      OT SHORT TERM GOAL #2   Title  Pt will perfrom all basic ADLS with supervision.    Time  4    Period  Weeks    Status  New      OT SHORT TERM GOAL #3   Title  Pt will demonstrate ability to retrieve a lightweight object at 110 shoulder flexion with RUE.    Time  4    Period  Weeks    Status  New      OT SHORT TERM GOAL #4   Title   Pt will demonstrate ability to perform functional activites in standing x 15 mins prior to rest break with no LOB.    Time  4    Period  Weeks    Status  New      OT SHORT TERM GOAL #5   Title  Pt / wife  will verbalize understanding of memory compensations and ways to keep thinking skills sharp.    Time  4    Period  Weeks    Status  New      Additional Short Term Goals   Additional Short Term Goals  Yes      OT SHORT TERM GOAL #6   Title  Pt will perfrom tub bench transfer with supervision.    Time  4    Period  Weeks    Status  New        OT Long Term Goals - 03/03/18 1305      OT LONG TERM GOAL #1   Title  I with updated HEP. due 05/02/18    Time  8    Period  Weeks    Status  New    Target Date  05/02/18      OT LONG TERM GOAL #2   Title  Pt will perform basic ADLs modified independently.    Time  8    Period  Weeks    Status  New      OT LONG TERM GOAL #3   Title  Pt will demonstrate ability to perform functional activity in standing x 25 mins prior to rest break without LOB.    Time  8    Period  Weeks    Status  New      OT LONG TERM GOAL #4   Title  Further assess cogntion and set goal prn    Time  8    Period  Weeks    Status  New      OT LONG TERM GOAL #5   Title  Pt will demonstrate adequate RUE strength to retrieve 5 lbs weight from overhead shelf at 110 shouler flexion x 5 without dropping.    Time  8    Period  Weeks    Status  New      Long Term Additional Goals   Additional Long Term Goals  Yes      OT LONG TERM GOAL #6   Title  Pt will perfrom basic home management, beverage prep at a modified independent level    Time  4    Period  Weeks    Status  New            Plan - 03/12/18 0954    Clinical Impression Statement  Pt demo decreased endurance and extremely deconditioned. Pt required several rest breaks t/o O.T. session today. Pt also w/ decreased processing and ability to follow directions. Pt required cues to use RUE. Pt  also demo bradykinesia in stepping patterns    Occupational Profile and client history currently impacting functional performance  PMH esophageal CA, undergoing chemo, hx of septic shock, pleural effusion, DM, pancytopenia, HTN, bradycardia, PE, dysphagiaPt is retired. Prior level of function I with ADLs and yardwork.    Occupational performance deficits (Please refer to evaluation for details):  ADL's;IADL's;Leisure;Social Participation    Rehab Potential  Fair    Current Impairments/barriers affecting progress:  severity of medial issues, ongoing chemotherapy, cogntive deficits, decreased endurance    OT Frequency  2x / week    OT Duration  8 weeks    OT Treatment/Interventions  Self-care/ADL training;Therapeutic exercise;Patient/family education;Neuromuscular education;Paraffin;Moist Heat;Fluidtherapy;Therapeutic activities;Balance training;Cognitive remediation/compensation;Passive range of motion;Manual Therapy;DME and/or AE instruction;Ultrasound;Cryotherapy    Plan  continue endurance and standing tolerance, simulate LE dressing, cane HEP for UB endurance    Consulted and Agree with Plan  of Care  Patient;Family member/caregiver    Family Member Consulted  wife       Patient will benefit from skilled therapeutic intervention in order to improve the following deficits and impairments:  Abnormal gait, Decreased mobility, Decreased strength, Decreased range of motion, Decreased endurance, Decreased activity tolerance, Decreased balance, Decreased knowledge of precautions, Decreased safety awareness, Difficulty walking, Impaired UE functional use, Decreased coordination, Cardiopulmonary status limiting activity, Decreased cognition, Decreased knowledge of use of DME, Impaired flexibility  Visit Diagnosis: Muscle weakness (generalized)  Physical deconditioning  Unsteadiness on feet  Stiffness of right shoulder, not elsewhere classified    Problem List Patient Active Problem List    Diagnosis Date Noted  . Septic shock ([place])   . Severe sepsis with septic shock ([place]) 02/26/2018  . Pleural effusion on right 02/26/2018  . Pancytopenia ([place])   . Port-A-Cath in place 01/08/2018  . Goals of care, counseling/discussion 12/25/2017  . Secondary malignant neoplasm of cervical lymph node ([place]) 09/30/2017  . Headache 06/30/2017  . Abnormal ECG 06/18/2017  . Tremor 05/05/2017  . Erectile dysfunction 10/28/2016  . Adhesive capsulitis of right shoulder associated with type 2 diabetes mellitus ([place]) 09/02/2016  . Esophageal stricture   . Dysphagia 01/23/2016  . Insulin dependent diabetes mellitus ([place]) 01/23/2016  . Controlled type 2 diabetes mellitus with diabetic peripheral angiopathy without gangrene, with long-term current use of insulin ([place]) 10/30/2015  . Fever 09/01/2015  . Diabetes mellitus due to underlying condition, uncontrolled, with circulatory complication ([place])   . Chronic diastolic heart failure ([place])   . Anemia requiring transfusions   . Hx pulmonary embolism   . Elevated troponin   . GI bleed 08/18/2015  . Symptomatic anemia 08/18/2015  . Pulmonary embolism ([place]) 08/18/2015  . HLD (hyperlipidemia) 08/18/2015  . GIB (gastrointestinal bleeding) 08/18/2015  . Gastrointestinal hemorrhage with melena   . Esophageal cancer ([place]) 07/25/2015  . LUQ abdominal pain 07/07/2015  . 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 Chest pain 12/02/2014  . Quit smoking within past year 11/04/2013  . Screening for prostate cancer 11/04/2013  . Encounter for long-term (current) use of other medications 11/04/2013  . Routine general medical examination at a health care facility 11/08/2012  . Gastroenteritis 09/10/2011  . PROTEINURIA, MILD 08/15/2009  . ANXIETY STATE, UNSPECIFIED 12/15/2008  . PULMONARY NODULE 11/24/2008  . EMPHYSEMA 11/08/2008  . COLONIC POLYPS, ADENOMATOUS 08/01/2008  . UNSPECIFIED INFLAMMATORY AND TOXIC NEUROPATHY 05/19/2008  . CHEST PAIN, PLEURITIC  05/19/2008  . HYPERCHOLESTEROLEMIA 02/03/2008  . Depression with anxiety 03/13/2007  . Essential hypertension 03/13/2007  . GERD 03/13/2007    Carey Bullocks, OTR/L 03/12/2018, 10:02 AM  [facility] [address] [place], [place], [address] Phone: [phone]   Fax:  [phone]  Name: Ronald Johnson MRN: [id number] Date of Birth: 1945/12/22

## 2018-03-12 NOTE — Patient Instructions (Signed)
Hip Abduction (Standing)    Stand with support. Squeeze pelvic floor and hold. Lift right leg out to side, keeping toe forward. Hold for 2___ seconds. Relax for _2__ seconds.  Repeat _10__ times. Do _1-2__ times a day. ALTERNATE LEGS     "I love a Parade" Lift    Using a chair if necessary, march in place 4 times in each phase: (1) Foot raised 10" Repeat __10__ times. Do _1___ sessions per day.  http://[email or web address].exer.us/344   Functional Quadriceps: Sit to Stand    Sit on edge of chair, feet flat on floor. Stand upright, extending knees fully. Repeat _5_ times per set. Do _1_ sets per session. Do __2__ sessions per day.  http://orth.exer.us/734   Copyright  VHI. All rights reserved.  Knee Extension: Resisted (Sitting)    With band looped around right ankle- 10 reps; 1__ sets per session. Do _1_ sessions per day.  http://orth.exer.us/690    High Step - Seated    Sit with back straight, feet flat, pointed out slightly. Shift weight onto one side: buttock and foot. Lift knee of unweighted foot to front. Hold _2__ seconds. Variation: Lift heel only Repeat _10_ times each side.PLACE BAND on top of knee.

## 2018-03-13 ENCOUNTER — Ambulatory Visit: Payer: Medicare Other | Admitting: Occupational Therapy

## 2018-03-13 ENCOUNTER — Ambulatory Visit: Payer: Medicare Other | Admitting: Physical Therapy

## 2018-03-13 ENCOUNTER — Encounter: Payer: Self-pay | Admitting: Physical Therapy

## 2018-03-13 DIAGNOSIS — M6281 Muscle weakness (generalized): Secondary | ICD-10-CM

## 2018-03-13 DIAGNOSIS — R2681 Unsteadiness on feet: Secondary | ICD-10-CM | POA: Diagnosis not present

## 2018-03-13 DIAGNOSIS — R41844 Frontal lobe and executive function deficit: Secondary | ICD-10-CM | POA: Diagnosis not present

## 2018-03-13 DIAGNOSIS — R278 Other lack of coordination: Secondary | ICD-10-CM | POA: Diagnosis not present

## 2018-03-13 DIAGNOSIS — R2689 Other abnormalities of gait and mobility: Secondary | ICD-10-CM | POA: Diagnosis not present

## 2018-03-13 DIAGNOSIS — M25611 Stiffness of right shoulder, not elsewhere classified: Secondary | ICD-10-CM | POA: Diagnosis not present

## 2018-03-13 NOTE — Patient Instructions (Addendum)
  Coordination Activities  Perform the following activities for 20 minutes 1 times per day with both hand(s).   Rotate ball in fingertips (clockwise and counter-clockwise).  Flip cards 1 at a time as fast turn palm up rotating forearm.  Deal cards with your thumb (Hold deck in hand and push card off top with thumb).  Pick up coins and stack.  Pick up stack of coins until you get 5 in your hand, then move coins from palm to fingertips to push into container one at a time.  Practice writing and/or typing.        Lie on back holding wand. Raise arms over head within pain free range Hold 5sec. Repeat 10 times per set.  Do 2-3 sessions per day.         Press-Up With Wand   Press wand up until elbows are straight, then reach wand over head to a pain free range. Hold 5 seconds. Repeat 10 times. Do 2-3 sessions per day.

## 2018-03-13 NOTE — Therapy (Signed)
[facility] [address] [place], [place], [address] Phone: [phone]   Fax:  [phone]  Physical Therapy Treatment  Patient Details  Name: Ronald Johnson MRN: [id number] Date of Birth: May 13, 1946 Referring Provider: Dr. Irine Seal [name], MD is PCP)   Encounter Date: 03/12/2018  PT End of Session - 03/13/18 0759    Visit Number  2    Number of Visits  17    Date for PT Re-Evaluation  05/03/18    Authorization Type  Medicare and [employer]    Authorization Time Period  03-03-18 - 06-01-18    PT Start Time  0940    PT Stop Time  1011 pt fatigued - requested to end session early     PT Time Calculation (min)  31 min       Past Medical History:  Diagnosis Date  . Anemia 08/15/2009  . Anxiety   . Arthritis   . [name]'s esophagus 2007  . COLONIC POLYPS, ADENOMATOUS 08/01/2008, 2013, 2014  . COPD (chronic obstructive pulmonary disease) (HCC)    no per pt  . Depression   . Diabetes type 2, controlled ([place]) 03/13/2007  . Diverticulitis   . ED (erectile dysfunction)   . EMPHYSEMA 11/08/2008   no per pt  . Esophageal cancer ([place]) "dx'd ~ 01/2015"  . GERD 03/13/2007  . GIB (gastrointestinal bleeding) 08/18/2015  . Hiatal hernia   . History of blood transfusion 08/18/2015   due to GIB  . HYPERCHOLESTEROLEMIA 02/03/2008  . HYPERTENSION 03/13/2007   pt denies, claims white coat syndrome  . Pneumonia   . Prostatism   . PULMONARY NODULE 11/24/2008  . Stomach cancer ([place]) "dx'd ~ 01/2015"  . TOBACCO ABUSE 11/24/2008    Past Surgical History:  Procedure Laterality Date  . BALLOON DILATION N/A 03/19/2016   Procedure: BALLOON DILATION;  Surgeon: Irene Shipper, MD;  [facility];  Service: Endoscopy;  Laterality: N/A;  . CIRCUMCISION    . COLONOSCOPY    . COMPLETE ESOPHAGECTOMY N/A 07/25/2015   Procedure: TRANSHIATAL TOTAL ESOPHAGECTOMY COMPLETE PYLOROMYOTOMY;  Surgeon: [name], MD;  Location: [facility];  Service: Thoracic;  Laterality: N/A;  . EGD  11/19/2005  . ESOPHAGOGASTRODUODENOSCOPY N/A 03/19/2016   Procedure: ESOPHAGOGASTRODUODENOSCOPY (EGD);  Surgeon: Irene Shipper, MD;  Location: [facility];  Service: Endoscopy;  Laterality: N/A;  . ESOPHAGOGASTRODUODENOSCOPY (EGD) WITH PROPOFOL N/A 09/15/2017   Procedure: ESOPHAGOGASTRODUODENOSCOPY (EGD) WITH PROPOFOL;  Surgeon: Irene Shipper, MD;  [facility];  Service: Endoscopy;  Laterality: N/A;  . EUS N/A 03/16/2015   Procedure: UPPER ENDOSCOPIC ULTRASOUND (EUS) RADIAL;  Surgeon: Milus Banister, MD;  [facility];  Service: Endoscopy;  Laterality: N/A;  . FOOT FRACTURE SURGERY Right 1980   right foot w/ pins and screws  . IR IMAGING GUIDED PORT INSERTION  01/05/2018  . IR US GUIDE VASC ACCESS LEFT  01/05/2018  . JEJUNOSTOMY N/A 07/25/2015   Procedure: FEEDING JEJUNOSTOMY;  Surgeon: [name], MD;  Location: [facility];  Service: Thoracic;  Laterality: N/A;  . MICROLARYNGOSCOPY W/VOCAL CORD INJECTION N/A 01/22/2018   Procedure: MICROLARYNGOSCOPY WITH VOCAL CORD INJECTION;  Surgeon: Melissa Montane, MD;  Location: [facility];  Service: ENT;  Laterality: N/A;  . POLYPECTOMY    . removal pins and screws foot  1980   right foot  . UPPER GASTROINTESTINAL ENDOSCOPY    . VASECTOMY    . VIDEO BRONCHOSCOPY N/A 07/25/2015   Procedure: VIDEO BRONCHOSCOPY;  Surgeon: [name], MD;  Location[facility];  Service: Thoracic;  Laterality: N/A;    There were no vitals filed for this visit.  Subjective Assessment - 03/13/18 0757    Subjective  Pt states he is not feeling well today - is tired - had OT prior to PT    Patient is accompained by:  Family member    Patient Stated Goals  "get back to where I was at" - improve endurance- walk without RW           TherxEx: Pt performed sit to stand from mat x 2 reps without UE support  3" walk test assessed - pt able to amb. With RW with 2L O2 1" 36 secs prior to needing seated rest  period   Exercises performed in seated position due to pt unable to tolerate standing after amb. 1" 36 secs - Pt performed seated LAQ's 10 reps each leg;  Added red theraband for resistance for incr. Challenge   Pt performed 10 reps with use of red band with 3 sec hold on each leg; seated bil. Hip flexion with use of red theraband for strengthening 10 reps each  (gave these 2 exs. For HEP)  Pt then able to perform standing alternating side kicks - 5 reps each leg only due to fatigue - with UE support on chair   Standing marching in place - 5 reps each leg only due to decr. Activity tolerance/c/o fatigue   Pt declined riding Nustep due to fatigue      [facility] Adult PT Treatment/Exercise - 03/12/18 0951      Standardized Balance Assessment   Standardized Balance Assessment  Berg Balance Test       Berg balance test was planned to be assessed but pt declined as he reported he did not "feel up to it today"       PT Education - 03/13/18 0758    Education Details  HEP initiated - standing side kicks, marching and sit to stand, LAQ & hip flexion with red theraband in seated    Person(s) Educated  Spouse    Methods  Explanation;Demonstration;Handout    Comprehension  Verbalized understanding;Returned demonstration       PT Short Term Goals - 03/04/18 2141      PT SHORT TERM GOAL #1   Title  Pt will demonstrate increased LE strength so he is able to perform sit to stand 5 times from mat without UE support.    Time  4    Period  Weeks    Status  New    Target Date  04/03/18      PT SHORT TERM GOAL #2   Title  Improve Berg balance test score by at least 4 points to reduce fall risk.    Time  4    Period  Weeks    Status  New    Target Date  04/03/18      PT SHORT TERM GOAL #3   Title  Improve gait velocity from 1.52 ft/sec (21.59 secs) to >/= 1.9 ft/sec with RW.    Baseline  21.59 secs = 1.52 ft/sec    Time  4    Period  Weeks    Status  New    Target Date  04/03/18       PT SHORT TERM GOAL #4   Title  Improve TUG score from 46.91 secs to </= 30 secs with RW to demo improved functional mobility.  Baseline  46.91 secs with RW    Time  4    Period  Weeks    Status  New    Target Date  04/03/18      PT SHORT TERM GOAL #5   Title  Independent in HEP for strengthening, balance and walking program.    Time  4    Period  Weeks    Status  New    Target Date  04/03/18      PT SHORT TERM GOAL #6   Title  Amb. 100' without RW with SBA without LOB.    Time  4    Period  Weeks    Status  New    Target Date  04/03/18        PT Long Term Goals - 03/04/18 2146      PT LONG TERM GOAL #1   Title  Improve Berg balance test score by at least 8 points from baseline    Time  8    Period  Weeks    Status  New    Target Date  05/04/18      PT LONG TERM GOAL #2   Title  Modified independent household ambulation without device.    Time  8    Period  Weeks    Status  New    Target Date  05/04/18      PT LONG TERM GOAL #3   Title  Improve TUG score from 46.91 to </= 25 secs with RW for reduced fall risk.    Baseline  46.91 secs with RW    Time  8    Period  Weeks    Status  New    Target Date  05/04/18      PT LONG TERM GOAL #4   Title  Increase gait velocity from 1.52 ft/sec to >/= 2.3 ft/sec with RW for incr. gait efficiency.    Baseline  1.52 ft/sec = 21.59 secs     Time  8    Period  Weeks    Status  New    Target Date  05/04/18      PT LONG TERM GOAL #5   Title  Increase distance in 3" walk test with RW by at least 100' to demo increased endurance/activity tolerance.     Time  8    Period  Weeks    Status  New    Target Date  05/04/18            Plan - 03/13/18 0801    Clinical Impression Statement  Pt reported fatigue and not feeling as well today as he did at time of eval (pt had OT first which may have contributed to fatigue);  pt declined doing Berg test due to not feeling well; 3" walk test attempted - pt able to amb. 1"  36 secs prior to needing seated rest period    PT Treatment/Interventions  ADLs/Self Care Home Management;DME Instruction;Gait training;Stair training;Functional mobility training;Patient/family education;Neuromuscular re-education;Therapeutic exercise;Therapeutic activities;Balance training    PT Next Visit Plan  check exercises given on 03-12-18; can try to perform Berg if pt feels that he can tolerate it - if not, work on gait and standing balance activities, Nustep as tolerated - FATIGUES VERY EASILY    Consulted and Agree with Plan of Care  Patient;Family member/caregiver    Family Member Consulted  wife Velta Addison       Patient will benefit from skilled therapeutic intervention in  order to improve the following deficits and impairments:     Visit Diagnosis: Muscle weakness (generalized)  Other abnormalities of gait and mobility  Unsteadiness on feet     Problem List Patient Active Problem List   Diagnosis Date Noted  . Septic shock ([place])   . Severe sepsis with septic shock ([place]) 02/26/2018  . Pleural effusion on right 02/26/2018  . Pancytopenia ([place])   . Port-A-Cath in place 01/08/2018  . Goals of care, counseling/discussion 12/25/2017  . Secondary malignant neoplasm of cervical lymph node ([place]) 09/30/2017  . Headache 06/30/2017  . Abnormal ECG 06/18/2017  . Tremor 05/05/2017  . Erectile dysfunction 10/28/2016  . Adhesive capsulitis of right shoulder associated with type 2 diabetes mellitus ([place]) 09/02/2016  . Esophageal stricture   . Dysphagia 01/23/2016  . Insulin dependent diabetes mellitus ([place]) 01/23/2016  . Controlled type 2 diabetes mellitus with diabetic peripheral angiopathy without gangrene, with long-term current use of insulin ([place]) 10/30/2015  . Fever 09/01/2015  . Diabetes mellitus due to underlying condition, uncontrolled, with circulatory complication ([place])   . Chronic diastolic heart failure ([place])   . Anemia requiring transfusions   . Hx pulmonary  embolism   . Elevated troponin   . GI bleed 08/18/2015  . Symptomatic anemia 08/18/2015  . Pulmonary embolism ([place]) 08/18/2015  . HLD (hyperlipidemia) 08/18/2015  . GIB (gastrointestinal bleeding) 08/18/2015  . Gastrointestinal hemorrhage with melena   . Esophageal cancer ([place]) 07/25/2015  . LUQ abdominal pain 07/07/2015  . 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 Chest pain 12/02/2014  . Quit smoking within past year 11/04/2013  . Screening for prostate cancer 11/04/2013  . Encounter for long-term (current) use of other medications 11/04/2013  . Routine general medical examination at a health care facility 11/08/2012  . Gastroenteritis 09/10/2011  . PROTEINURIA, MILD 08/15/2009  . ANXIETY STATE, UNSPECIFIED 12/15/2008  . PULMONARY NODULE 11/24/2008  . EMPHYSEMA 11/08/2008  . COLONIC POLYPS, ADENOMATOUS 08/01/2008  . UNSPECIFIED INFLAMMATORY AND TOXIC NEUROPATHY 05/19/2008  . CHEST PAIN, PLEURITIC 05/19/2008  . HYPERCHOLESTEROLEMIA 02/03/2008  . Depression with anxiety 03/13/2007  . Essential hypertension 03/13/2007  . GERD 03/13/2007    Dild[place]Jenness Corner, PT 03/13/2018, 8:06 AM  [facility] [address] [place], [place], [address] Phone: [phone]   Fax:  [phone]  Name: Ronald Johnson MRN: [id number] Date of Birth: March 29, 1946

## 2018-03-13 NOTE — Therapy (Signed)
[facility] [address] [place], [place], [address] Phone: [phone]   Fax:  [phone]  Occupational Therapy Treatment  Patient Details  Name: Ronald Johnson MRN: [id number] Date of Birth: 03-31-1946 Referring Provider: Dr. Irine Seal [name], MD is PCP)   Encounter Date: 03/13/2018  OT End of Session - 03/13/18 0821    Visit Number  3    Number of Visits  17    Date for OT Re-Evaluation  05/02/18    Authorization Type  Medicare    Authorization Time Period  POC 2x week x 8 weeks, cert 90 days- to 61/60/73    Authorization - Visit Number  3    Authorization - Number of Visits  10    OT Start Time  0804    OT Stop Time  0845    OT Time Calculation (min)  41 min    Behavior During Therapy  [facility] for tasks assessed/performed       Past Medical History:  Diagnosis Date  . Anemia 08/15/2009  . Anxiety   . Arthritis   . [name]'s esophagus 2007  . COLONIC POLYPS, ADENOMATOUS 08/01/2008, 2013, 2014  . COPD (chronic obstructive pulmonary disease) (HCC)    no per pt  . Depression   . Diabetes type 2, controlled ([place]) 03/13/2007  . Diverticulitis   . ED (erectile dysfunction)   . EMPHYSEMA 11/08/2008   no per pt  . Esophageal cancer ([place]) "dx'd ~ 01/2015"  . GERD 03/13/2007  . GIB (gastrointestinal bleeding) 08/18/2015  . Hiatal hernia   . History of blood transfusion 08/18/2015   due to GIB  . HYPERCHOLESTEROLEMIA 02/03/2008  . HYPERTENSION 03/13/2007   pt denies, claims white coat syndrome  . Pneumonia   . Prostatism   . PULMONARY NODULE 11/24/2008  . Stomach cancer ([place]) "dx'd ~ 01/2015"  . TOBACCO ABUSE 11/24/2008    Past Surgical History:  Procedure Laterality Date  . BALLOON DILATION N/A 03/19/2016   Procedure: BALLOON DILATION;  Surgeon: Irene Shipper, MD;  [facility];  Service: Endoscopy;  Laterality: N/A;  . CIRCUMCISION    . COLONOSCOPY    . COMPLETE ESOPHAGECTOMY N/A 07/25/2015    Procedure: TRANSHIATAL TOTAL ESOPHAGECTOMY COMPLETE PYLOROMYOTOMY;  Surgeon: [name], MD;  Location: [facility];  Service: Thoracic;  Laterality: N/A;  . EGD  11/19/2005  . ESOPHAGOGASTRODUODENOSCOPY N/A 03/19/2016   Procedure: ESOPHAGOGASTRODUODENOSCOPY (EGD);  Surgeon: Irene Shipper, MD;  Location: [facility];  Service: Endoscopy;  Laterality: N/A;  . ESOPHAGOGASTRODUODENOSCOPY (EGD) WITH PROPOFOL N/A 09/15/2017   Procedure: ESOPHAGOGASTRODUODENOSCOPY (EGD) WITH PROPOFOL;  Surgeon: Irene Shipper, MD;  [facility];  Service: Endoscopy;  Laterality: N/A;  . EUS N/A 03/16/2015   Procedure: UPPER ENDOSCOPIC ULTRASOUND (EUS) RADIAL;  Surgeon: Milus Banister, MD;  [facility];  Service: Endoscopy;  Laterality: N/A;  . FOOT FRACTURE SURGERY Right 1980   right foot w/ pins and screws  . IR IMAGING GUIDED PORT INSERTION  01/05/2018  . IR US GUIDE VASC ACCESS LEFT  01/05/2018  . JEJUNOSTOMY N/A 07/25/2015   Procedure: FEEDING JEJUNOSTOMY;  Surgeon: [name], MD;  Location: [facility];  Service: Thoracic;  Laterality: N/A;  . MICROLARYNGOSCOPY W/VOCAL CORD INJECTION N/A 01/22/2018   Procedure: MICROLARYNGOSCOPY WITH VOCAL CORD INJECTION;  Surgeon: Melissa Montane, MD;  Location: [facility];  Service: ENT;  Laterality: N/A;  . POLYPECTOMY    . removal pins and screws foot  1980  right foot  . UPPER GASTROINTESTINAL ENDOSCOPY    . VASECTOMY    . VIDEO BRONCHOSCOPY N/A 07/25/2015   Procedure: VIDEO BRONCHOSCOPY;  Surgeon: [name], MD;  Location: [facility] OR;  Service: Thoracic;  Laterality: N/A;    There were no vitals filed for this visit.  Subjective Assessment - 03/13/18 0820    Pertinent History  PMH esophageal CA, undergoing chemo, hx of septic shock, pleural effusion, DM, pancytopenia, HTN, bradycardia, PE, dysphagia    Patient Stated Goals  to get back to doing what I was doing before     Currently in Pain?  No/denies           Treatment:Pt was instructed in HEP,  see pt instructions. O2 sats in supine 88%, however after sitting up they resumed 92%.        OT Education - 03/13/18 1037    Education Details  coordination HEP, cane ex supine -10-15 reps each   Person(s) Educated  Patient;Spouse    Methods  Explanation;Handout;Demonstration    Comprehension  Verbalized understanding;Returned demonstration;Verbal cues required;Tactile cues required                  OT Short Term Goals - 03/03/18 1301      OT SHORT TERM GOAL #1   Title  I with HEP for strength and coordination due 04/02/18    Time  4    Period  Weeks    Status  New    Target Date  04/02/18      OT SHORT TERM GOAL #2   Title  Pt will perfrom all basic ADLS with supervision.    Time  4    Period  Weeks    Status  New      OT SHORT TERM GOAL #3   Title  Pt will demonstrate ability to retrieve a lightweight object at 110 shoulder flexion with RUE.    Time  4    Period  Weeks    Status  New      OT SHORT TERM GOAL #4   Title  Pt will demonstrate ability to perform functional activites in standing x 15 mins prior to rest break with no LOB.    Time  4    Period  Weeks    Status  New      OT SHORT TERM GOAL #5   Title  Pt / wife will verbalize understanding of memory compensations and ways to keep thinking skills sharp.    Time  4    Period  Weeks    Status  New      Additional Short Term Goals   Additional Short Term Goals  Yes      OT SHORT TERM GOAL #6   Title  Pt will perfrom tub bench transfer with supervision.    Time  4    Period  Weeks    Status  New        OT Long Term Goals - 03/03/18 1305      OT LONG TERM GOAL #1   Title  I with updated HEP. due 05/02/18    Time  8    Period  Weeks    Status  New    Target Date  05/02/18      OT LONG TERM GOAL #2   Title  Pt will perform basic ADLs modified independently.    Time  8    Period  Weeks    Status  New      OT LONG TERM GOAL #3   Title  Pt will demonstrate ability to perform  functional activity in standing x 25 mins prior to rest break without LOB.    Time  8    Period  Weeks    Status  New      OT LONG TERM GOAL #4   Title  Further assess cogntion and set goal prn    Time  8    Period  Weeks    Status  New      OT LONG TERM GOAL #5   Title  Pt will demonstrate adequate RUE strength to retrieve 5 lbs weight from overhead shelf at 110 shouler flexion x 5 without dropping.    Time  8    Period  Weeks    Status  New      Long Term Additional Goals   Additional Long Term Goals  Yes      OT LONG TERM GOAL #6   Title  Pt will perfrom basic home management, beverage prep at a modified independent level    Time  4    Period  Weeks    Status  New            Plan - 03/13/18 6010    Clinical Impression Statement  Pt is progressing towards goals. Pt reports he was extremely tired after participating in therapies yesterday.    Occupational Profile and client history currently impacting functional performance  PMH esophageal CA, undergoing chemo, hx of septic shock, pleural effusion, DM, pancytopenia, HTN, bradycardia, PE, dysphagiaPt is retired. Prior level of function I with ADLs and yardwork.    Occupational performance deficits (Please refer to evaluation for details):  ADL's;IADL's;Leisure;Social Participation    Rehab Potential  Fair    Current Impairments/barriers affecting progress:  severity of medial issues, ongoing chemotherapy, cogntive deficits, decreased endurance    OT Frequency  2x / week    OT Duration  8 weeks    OT Treatment/Interventions  Self-care/ADL training;Therapeutic exercise;Patient/family education;Neuromuscular education;Paraffin;Moist Heat;Fluidtherapy;Therapeutic activities;Balance training;Cognitive remediation/compensation;Passive range of motion;Manual Therapy;DME and/or AE instruction;Ultrasound;Cryotherapy    Plan  continue endurance and standing tolerance, ADLS LB dressing     Consulted and Agree with Plan of Care   Patient;Family member/caregiver    Family Member Consulted  wife       Patient will benefit from skilled therapeutic intervention in order to improve the following deficits and impairments:  Abnormal gait, Decreased mobility, Decreased strength, Decreased range of motion, Decreased endurance, Decreased activity tolerance, Decreased balance, Decreased knowledge of precautions, Decreased safety awareness, Difficulty walking, Impaired UE functional use, Decreased coordination, Cardiopulmonary status limiting activity, Decreased cognition, Decreased knowledge of use of DME, Impaired flexibility  Visit Diagnosis: Muscle weakness (generalized)  Other lack of coordination  Frontal lobe and executive function deficit    Problem List Patient Active Problem List   Diagnosis Date Noted  . Septic shock ([place])   . Severe sepsis with septic shock ([place]) 02/26/2018  . Pleural effusion on right 02/26/2018  . Pancytopenia ([place])   . Port-A-Cath in place 01/08/2018  . Goals of care, counseling/discussion 12/25/2017  . Secondary malignant neoplasm of cervical lymph node ([place]) 09/30/2017  . Headache 06/30/2017  . Abnormal ECG 06/18/2017  . Tremor 05/05/2017  . Erectile dysfunction 10/28/2016  . Adhesive capsulitis of right shoulder associated with type 2 diabetes mellitus ([place]) 09/02/2016  . Esophageal stricture   . Dysphagia 01/23/2016  . Insulin dependent  diabetes mellitus ([place]) 01/23/2016  . Controlled type 2 diabetes mellitus with diabetic peripheral angiopathy without gangrene, with long-term current use of insulin ([place]) 10/30/2015  . Fever 09/01/2015  . Diabetes mellitus due to underlying condition, uncontrolled, with circulatory complication ([place])   . Chronic diastolic heart failure ([place])   . Anemia requiring transfusions   . Hx pulmonary embolism   . Elevated troponin   . GI bleed 08/18/2015  . Symptomatic anemia 08/18/2015  . Pulmonary embolism ([place]) 08/18/2015  . HLD (hyperlipidemia)  08/18/2015  . GIB (gastrointestinal bleeding) 08/18/2015  . Gastrointestinal hemorrhage with melena   . Esophageal cancer ([place]) 07/25/2015  . LUQ abdominal pain 07/07/2015  . 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 Chest pain 12/02/2014  . Quit smoking within past year 11/04/2013  . Screening for prostate cancer 11/04/2013  . Encounter for long-term (current) use of other medications 11/04/2013  . Routine general medical examination at a health care facility 11/08/2012  . Gastroenteritis 09/10/2011  . PROTEINURIA, MILD 08/15/2009  . ANXIETY STATE, UNSPECIFIED 12/15/2008  . PULMONARY NODULE 11/24/2008  . EMPHYSEMA 11/08/2008  . COLONIC POLYPS, ADENOMATOUS 08/01/2008  . UNSPECIFIED INFLAMMATORY AND TOXIC NEUROPATHY 05/19/2008  . CHEST PAIN, PLEURITIC 05/19/2008  . HYPERCHOLESTEROLEMIA 02/03/2008  . Depression with anxiety 03/13/2007  . Essential hypertension 03/13/2007  . GERD 03/13/2007    Aking Klabunde 03/13/2018, 10:33 AM Theone Murdoch, OTR/L [phone] Phone: [phone] 10:42 AM 03/13/18 [facility] [address] [place], [place], [address] Phone: [phone]   Fax:  [phone]  Name: Ronald Johnson MRN: [id number] Date of Birth: 1946-04-28

## 2018-03-14 NOTE — Therapy (Signed)
[facility] [address] [place], [place], [address] Phone: [phone]   Fax:  [phone]  Physical Therapy Treatment  Patient Details  Name: Ronald Johnson MRN: [id number] Date of Birth: 10/17/1945 Referring Provider: Dr. Irine Seal [name], MD is PCP)   Encounter Date: 03/13/2018  PT End of Session - 03/13/18 1600    Visit Number  3    Number of Visits  17    Date for PT Re-Evaluation  05/03/18    Authorization Type  Medicare and AARP    Authorization Time Period  03-03-18 - 06-01-18    PT Start Time  0847    PT Stop Time  0929    PT Time Calculation (min)  42 min    Equipment Utilized During Treatment  Gait belt;Oxygen    Activity Tolerance  Patient tolerated treatment well;Patient limited by fatigue    Behavior During Therapy  [facility] for tasks assessed/performed       Past Medical History:  Diagnosis Date  . Anemia 08/15/2009  . Anxiety   . Arthritis   . [name]'s esophagus 2007  . COLONIC POLYPS, ADENOMATOUS 08/01/2008, 2013, 2014  . COPD (chronic obstructive pulmonary disease) (HCC)    no per pt  . Depression   . Diabetes type 2, controlled ([place]) 03/13/2007  . Diverticulitis   . ED (erectile dysfunction)   . EMPHYSEMA 11/08/2008   no per pt  . Esophageal cancer ([place]) "dx'd ~ 01/2015"  . GERD 03/13/2007  . GIB (gastrointestinal bleeding) 08/18/2015  . Hiatal hernia   . History of blood transfusion 08/18/2015   due to GIB  . HYPERCHOLESTEROLEMIA 02/03/2008  . HYPERTENSION 03/13/2007   pt denies, claims white coat syndrome  . Pneumonia   . Prostatism   . PULMONARY NODULE 11/24/2008  . Stomach cancer ([place]) "dx'd ~ 01/2015"  . TOBACCO ABUSE 11/24/2008    Past Surgical History:  Procedure Laterality Date  . BALLOON DILATION N/A 03/19/2016   Procedure: BALLOON DILATION;  Surgeon: Irene Shipper, MD;  [facility];  Service: Endoscopy;  Laterality: N/A;  . CIRCUMCISION    . COLONOSCOPY     . COMPLETE ESOPHAGECTOMY N/A 07/25/2015   Procedure: TRANSHIATAL TOTAL ESOPHAGECTOMY COMPLETE PYLOROMYOTOMY;  Surgeon: [name], MD;  Location: [facility];  Service: Thoracic;  Laterality: N/A;  . EGD  11/19/2005  . ESOPHAGOGASTRODUODENOSCOPY N/A 03/19/2016   Procedure: ESOPHAGOGASTRODUODENOSCOPY (EGD);  Surgeon: Irene Shipper, MD;  Location: [facility];  Service: Endoscopy;  Laterality: N/A;  . ESOPHAGOGASTRODUODENOSCOPY (EGD) WITH PROPOFOL N/A 09/15/2017   Procedure: ESOPHAGOGASTRODUODENOSCOPY (EGD) WITH PROPOFOL;  Surgeon: Irene Shipper, MD;  [facility];  Service: Endoscopy;  Laterality: N/A;  . EUS N/A 03/16/2015   Procedure: UPPER ENDOSCOPIC ULTRASOUND (EUS) RADIAL;  Surgeon: Milus Banister, MD;  [facility];  Service: Endoscopy;  Laterality: N/A;  . FOOT FRACTURE SURGERY Right 1980   right foot w/ pins and screws  . IR IMAGING GUIDED PORT INSERTION  01/05/2018  . IR US GUIDE VASC ACCESS LEFT  01/05/2018  . JEJUNOSTOMY N/A 07/25/2015   Procedure: FEEDING JEJUNOSTOMY;  Surgeon: [name], MD;  Location: [facility];  Service: Thoracic;  Laterality: N/A;  . MICROLARYNGOSCOPY W/VOCAL CORD INJECTION N/A 01/22/2018   Procedure: MICROLARYNGOSCOPY WITH VOCAL CORD INJECTION;  Surgeon: Melissa Montane, MD;  Location: [facility];  Service: ENT;  Laterality: N/A;  . POLYPECTOMY    . removal pins and screws foot  1980  right foot  . UPPER GASTROINTESTINAL ENDOSCOPY    . VASECTOMY    . VIDEO BRONCHOSCOPY N/A 07/25/2015   Procedure: VIDEO BRONCHOSCOPY;  Surgeon: [name], MD;  [facility];  Service: Thoracic;  Laterality: N/A;    There were no vitals filed for this visit.  Subjective Assessment - 03/13/18 0849    Subjective  No new complaitns. Does report feeling tired and weak today, had OT prior to PT again today.     Patient is accompained by:  Family member    Pertinent History  pt currently getting chemo; on 2L O2; severe sepsis with septic shock (likely due to Rt  pleural effusion):  bradycardia:  HTN:  GE junction carcinoma:  IDDM    Patient Stated Goals  "get back to where I was at" - improve endurance- walk without RW    Currently in Pain?  No/denies    Pain Score  0-No pain         [facility] PT Assessment - 03/13/18 0850      Standardized Balance Assessment   Standardized Balance Assessment  Berg Balance Test      Berg Balance Test   Sit to Stand  Able to stand without using hands and stabilize independently    Standing Unsupported  Able to stand safely 2 minutes    Sitting with Back Unsupported but Feet Supported on Floor or Stool  Able to sit safely and securely 2 minutes    Stand to Sit  Sits safely with minimal use of hands    Transfers  Able to transfer safely, minor use of hands    Standing Unsupported with Eyes Closed  Able to stand 10 seconds with supervision    Standing Ubsupported with Feet Together  Able to place feet together independently but unable to hold for 30 seconds    From Standing, Reach Forward with Outstretched Arm  Can reach forward >12 cm safely (5") 6 inches    From Standing Position, Pick up Object from Floor  Able to pick up shoe, needs supervision    From Standing Position, Turn to Look Behind Over each Shoulder  Looks behind from both sides and weight shifts well    Turn 360 Degrees  Able to turn 360 degrees safely but slowly    Standing Unsupported, Alternately Place Feet on Step/Stool  Able to complete 4 steps without aid or supervision used 4 inch box    Standing Unsupported, One Foot in Front  Able to plae foot ahead of the other independently and hold 30 seconds    Standing on One Leg  Able to lift leg independently and hold > 10 seconds    Total Score  46    Berg comment:  46/56= moderate risk          [facility] Adult PT Treatment/Exercise - 03/13/18 0920      Ambulation/Gait   Ambulation/Gait  Yes    Ambulation/Gait Assistance  5: Supervision    Ambulation/Gait Assistance Details  cues on posture and  step length    Ambulation Distance (Feet)  50 Feet    Assistive device  Rolling walker    Gait Pattern  Step-through pattern    Ambulation Surface  Level;Indoor      Exercises   Exercises  Other Exercises    Other Exercises   seated at edge of mat: with red band resistance alternating marching x 10 reps each leg, long arc quads x 10 reps each leg. cues on  ex form and technique.          PT Short Term Goals - 03/14/18 1605      PT SHORT TERM GOAL #1   Title  Pt will demonstrate increased LE strength so he is able to perform sit to stand 5 times from mat without UE support. (all STGs due by 04/03/18)    Time  4    Period  Weeks    Status  New      PT SHORT TERM GOAL #2   Title  Improve Berg balance test score by at least 4 points to reduce fall risk.    Baseline  03/13/18: 46/56 was baseline value today    Time  4    Period  Weeks    Status  New      PT SHORT TERM GOAL #3   Title  Improve gait velocity from 1.52 ft/sec (21.59 secs) to >/= 1.9 ft/sec with RW.    Baseline  21.59 secs = 1.52 ft/sec    Time  4    Period  Weeks    Status  New      PT SHORT TERM GOAL #4   Title  Improve TUG score from 46.91 secs to </= 30 secs with RW to demo improved functional mobility.    Baseline  46.91 secs with RW    Time  4    Period  Weeks    Status  New      PT SHORT TERM GOAL #5   Title  Independent in HEP for strengthening, balance and walking program.    Time  4    Period  Weeks    Status  New      PT SHORT TERM GOAL #6   Title  Amb. 100' without RW with SBA without LOB.    Time  4    Period  Weeks    Status  New        PT Long Term Goals - 03/14/18 1605      PT LONG TERM GOAL #1   Title  Improve Berg balance test score by at least 8 points from baseline. (all LTGs due by 05/04/18)    Baseline  03/13/18: 46/56 scored at baseline    Time  8    Period  Weeks    Status  New      PT LONG TERM GOAL #2   Title  Modified independent household ambulation without device.     Time  8    Period  Weeks    Status  New      PT LONG TERM GOAL #3   Title  Improve TUG score from 46.91 to </= 25 secs with RW for reduced fall risk.    Baseline  46.91 secs with RW    Time  8    Period  Weeks    Status  New      PT LONG TERM GOAL #4   Title  Increase gait velocity from 1.52 ft/sec to >/= 2.3 ft/sec with RW for incr. gait efficiency.    Baseline  1.52 ft/sec = 21.59 secs     Time  8    Period  Weeks    Status  New      PT LONG TERM GOAL #5   Title  Increase distance in 3" walk test with RW by at least 100' to demo increased endurance/activity tolerance.     Time  8    Period  Weeks    Status  New            Plan - 03/13/18 1601    Clinical Impression Statement  Today's skilled session focused on establishing baseline value for Berg Balance Test with pt scoring 46/56 today. Remainder of session addressed LE strengthening and gait. Pt continues to fatigue quickly needing seated rest breaks to recover. Pt also needs cues for pursed lip breathing so to get maximal benefit of oxygen (tends to breathe through mouth at times). Pt's SaO2 levels decreased to 88-90% with activity, recovered quickly to >/= 93% wiht rest breaks. Pt is progressing toward goals and should benefit from continued PT to progress toward unmet goals.                          PT Treatment/Interventions  ADLs/Self Care Home Management;DME Instruction;Gait training;Stair training;Functional mobility training;Patient/family education;Neuromuscular re-education;Therapeutic exercise;Therapeutic activities;Balance training    PT Next Visit Plan  work on gait and standing balance activities, Nustep as tolerated - FATIGUES VERY EASILY    Consulted and Agree with Plan of Care  Patient;Family member/caregiver    Family Member Consulted  wife Velta Addison       Patient will benefit from skilled therapeutic intervention in order to improve the following deficits and impairments:     Visit Diagnosis: Muscle  weakness (generalized)  Unsteadiness on feet     Problem List Patient Active Problem List   Diagnosis Date Noted  . Septic shock ([place])   . Severe sepsis with septic shock ([place]) 02/26/2018  . Pleural effusion on right 02/26/2018  . Pancytopenia ([place])   . Port-A-Cath in place 01/08/2018  . Goals of care, counseling/discussion 12/25/2017  . Secondary malignant neoplasm of cervical lymph node ([place]) 09/30/2017  . Headache 06/30/2017  . Abnormal ECG 06/18/2017  . Tremor 05/05/2017  . Erectile dysfunction 10/28/2016  . Adhesive capsulitis of right shoulder associated with type 2 diabetes mellitus ([place]) 09/02/2016  . Esophageal stricture   . Dysphagia 01/23/2016  . Insulin dependent diabetes mellitus ([place]) 01/23/2016  . Controlled type 2 diabetes mellitus with diabetic peripheral angiopathy without gangrene, with long-term current use of insulin ([place]) 10/30/2015  . Fever 09/01/2015  . Diabetes mellitus due to underlying condition, uncontrolled, with circulatory complication ([place])   . Chronic diastolic heart failure ([place])   . Anemia requiring transfusions   . Hx pulmonary embolism   . Elevated troponin   . GI bleed 08/18/2015  . Symptomatic anemia 08/18/2015  . Pulmonary embolism ([place]) 08/18/2015  . HLD (hyperlipidemia) 08/18/2015  . GIB (gastrointestinal bleeding) 08/18/2015  . Gastrointestinal hemorrhage with melena   . Esophageal cancer ([place]) 07/25/2015  . LUQ abdominal pain 07/07/2015  . 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 Chest pain 12/02/2014  . Quit smoking within past year 11/04/2013  . Screening for prostate cancer 11/04/2013  . Encounter for long-term (current) use of other medications 11/04/2013  . Routine general medical examination at a health care facility 11/08/2012  . Gastroenteritis 09/10/2011  . PROTEINURIA, MILD 08/15/2009  . ANXIETY STATE, UNSPECIFIED 12/15/2008  . PULMONARY NODULE 11/24/2008  . EMPHYSEMA 11/08/2008  . COLONIC POLYPS, ADENOMATOUS  08/01/2008  . UNSPECIFIED INFLAMMATORY AND TOXIC NEUROPATHY 05/19/2008  . CHEST PAIN, PLEURITIC 05/19/2008  . HYPERCHOLESTEROLEMIA 02/03/2008  . Depression with anxiety 03/13/2007  . Essential hypertension 03/13/2007  . GERD 03/13/2007    Willow Ora, PTA, [facility] [address], [place], [place]  [address] [phone] 03/14/18, 4:06 PM   Name: Ronald Johnson MRN: [id number] Date of Birth: 1946/03/25

## 2018-03-16 ENCOUNTER — Ambulatory Visit: Payer: Self-pay | Admitting: Nurse Practitioner

## 2018-03-16 ENCOUNTER — Other Ambulatory Visit: Payer: Self-pay

## 2018-03-16 ENCOUNTER — Ambulatory Visit: Payer: Self-pay

## 2018-03-17 ENCOUNTER — Encounter: Payer: Self-pay | Admitting: Endocrinology

## 2018-03-17 ENCOUNTER — Ambulatory Visit (INDEPENDENT_AMBULATORY_CARE_PROVIDER_SITE_OTHER): Payer: Medicare Other | Admitting: Endocrinology

## 2018-03-17 VITALS — BP 114/60 | HR 89 | Temp 97.9°F | Ht 76.0 in | Wt 195.0 lb

## 2018-03-17 DIAGNOSIS — Z794 Long term (current) use of insulin: Secondary | ICD-10-CM | POA: Diagnosis not present

## 2018-03-17 DIAGNOSIS — E1122 Type 2 diabetes mellitus with diabetic chronic kidney disease: Secondary | ICD-10-CM | POA: Diagnosis not present

## 2018-03-17 DIAGNOSIS — E1151 Type 2 diabetes mellitus with diabetic peripheral angiopathy without gangrene: Secondary | ICD-10-CM

## 2018-03-17 LAB — POCT GLYCOSYLATED HEMOGLOBIN (HGB A1C): HEMOGLOBIN A1C: 7.8 % — AB (ref 4.0–5.6)

## 2018-03-17 MED ORDER — INSULIN NPH ISOPHANE & REGULAR (70-30) 100 UNIT/ML ~~LOC~~ SUSP: 70.0000 [IU] | Freq: Every day | SUBCUTANEOUS | Status: DC

## 2018-03-17 NOTE — Progress Notes (Signed)
Subjective:    Patient ID: Ronald Johnson, male    DOB: 1946-02-09, 72 y.o.   MRN: [id number]  HPI Pt returns for f/u of diabetes mellitus: DM type: Insulin-requiring type 2.  Dx'ed: 2353 Complications: polyneuropathy, CAD, PAD, and renal insuff.  Therapy: insulin since 2004.  DKA: never.  Severe hypoglycemia: never.   Pancreatitis: never.   Other: he changed to qd insulin, after he did not achieve good control on multiple daily injections; he cannot afford insulin analogs; insulin requirement has varied since cancer dx; he changed to QAM 70/30, due to pattern of cbg's.  Interval history: He is off steroids, except for chemotx days (he has 1 more remaining).  He takes 100 units on chemo days, and 70 units.  He says cbg vary from 90-100's.   Past Medical History:  Diagnosis Date  . Anemia 08/15/2009  . Anxiety   . Arthritis   . [name]'s esophagus 2007  . COLONIC POLYPS, ADENOMATOUS 08/01/2008, 2013, 2014  . COPD (chronic obstructive pulmonary disease) (HCC)    no per pt  . Depression   . Diabetes type 2, controlled ([place]) 03/13/2007  . Diverticulitis   . ED (erectile dysfunction)   . EMPHYSEMA 11/08/2008   no per pt  . Esophageal cancer ([place]) "dx'd ~ 01/2015"  . GERD 03/13/2007  . GIB (gastrointestinal bleeding) 08/18/2015  . Hiatal hernia   . History of blood transfusion 08/18/2015   due to GIB  . HYPERCHOLESTEROLEMIA 02/03/2008  . HYPERTENSION 03/13/2007   pt denies, claims white coat syndrome  . Pneumonia   . Prostatism   . PULMONARY NODULE 11/24/2008  . Stomach cancer ([place]) "dx'd ~ 01/2015"  . TOBACCO ABUSE 11/24/2008    Past Surgical History:  Procedure Laterality Date  . BALLOON DILATION N/A 03/19/2016   Procedure: BALLOON DILATION;  Surgeon: Irene Shipper, MD;  [facility];  Service: Endoscopy;  Laterality: N/A;  . CIRCUMCISION    . COLONOSCOPY    . COMPLETE ESOPHAGECTOMY N/A 07/25/2015   Procedure: TRANSHIATAL TOTAL ESOPHAGECTOMY COMPLETE PYLOROMYOTOMY;   Surgeon: [name], MD;  Location: [facility];  Service: Thoracic;  Laterality: N/A;  . EGD  11/19/2005  . ESOPHAGOGASTRODUODENOSCOPY N/A 03/19/2016   Procedure: ESOPHAGOGASTRODUODENOSCOPY (EGD);  Surgeon: Irene Shipper, MD;  [facility];  Service: Endoscopy;  Laterality: N/A;  . ESOPHAGOGASTRODUODENOSCOPY (EGD) WITH PROPOFOL N/A 09/15/2017   Procedure: ESOPHAGOGASTRODUODENOSCOPY (EGD) WITH PROPOFOL;  Surgeon: Irene Shipper, MD;  [facility];  Service: Endoscopy;  Laterality: N/A;  . EUS N/A 03/16/2015   Procedure: UPPER ENDOSCOPIC ULTRASOUND (EUS) RADIAL;  Surgeon: Milus Banister, MD;  [facility];  Service: Endoscopy;  Laterality: N/A;  . FOOT FRACTURE SURGERY Right 1980   right foot w/ pins and screws  . IR IMAGING GUIDED PORT INSERTION  01/05/2018  . IR US GUIDE VASC ACCESS LEFT  01/05/2018  . JEJUNOSTOMY N/A 07/25/2015   Procedure: FEEDING JEJUNOSTOMY;  Surgeon: [name], MD;  Location: [facility];  Service: Thoracic;  Laterality: N/A;  . MICROLARYNGOSCOPY W/VOCAL CORD INJECTION N/A 01/22/2018   Procedure: MICROLARYNGOSCOPY WITH VOCAL CORD INJECTION;  Surgeon: Melissa Montane, MD;  Location: [facility];  Service: ENT;  Laterality: N/A;  . POLYPECTOMY    . removal pins and screws foot  1980   right foot  . UPPER GASTROINTESTINAL ENDOSCOPY    . VASECTOMY    . VIDEO BRONCHOSCOPY N/A 07/25/2015   Procedure: VIDEO BRONCHOSCOPY;  Surgeon: [name], MD;  [facility];  Service: Thoracic;  Laterality: N/A;    Social History   Socioeconomic History  . Marital status: Married    Spouse name: Not on file  . Number of children: 2  . Years of education: Not on file  . Highest education level: Not on file  Occupational History  . Occupation: retired    Fish farm manager: [employer]    Comment: works Education administrator  . Financial resource strain: Not on file  . Food insecurity:    Worry: Not on file    Inability: Not on file  .  Transportation needs:    Medical: Not on file    Non-medical: Not on file  Tobacco Use  . Smoking status: Former Smoker    Packs/day: 1.00    Years: 35.00    Pack years: 35.00    Types: Cigarettes    Last attempt to quit: 10/31/2008    Years since quitting: 9.3  . Smokeless tobacco: Never Used  Substance and Sexual Activity  . Alcohol use: No    Alcohol/week: 0.0 oz  . Drug use: No  . Sexual activity: Not Currently    Partners: Female  Lifestyle  . Physical activity:    Days per week: Not on file    Minutes per session: Not on file  . Stress: Not on file  Relationships  . Social connections:    Talks on phone: Not on file    Gets together: Not on file    Attends religious service: Not on file    Active member of club or organization: Not on file    Attends meetings of clubs or organizations: Not on file    Relationship status: Not on file  . Intimate partner violence:    Fear of current or ex partner: Not on file    Emotionally abused: Not on file    Physically abused: Not on file    Forced sexual activity: Not on file  Other Topics Concern  . 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 albuterol (PROVENTIL HFA;VENTOLIN HFA) 108 (90 Base) MCG/ACT inhaler Inhale 2 puffs into the lungs every 6 (six) hours as needed for wheezing or shortness of breath. 1 Inhaler 2  . dextromethorphan-guaiFENesin (MUCINEX DM) 30-600 MG 12hr tablet Take 1 tablet by mouth 2 (two) times daily.    . diazepam (VALIUM) 5 MG tablet Take 5 mg by mouth every 6 (six) hours as needed for anxiety.    Marland Kitchen escitalopram (LEXAPRO) 20 MG tablet Take 1 tablet (20 mg total) by mouth daily. 90 tablet 3  . fluticasone furoate-vilanterol (BREO ELLIPTA) 100-25 MCG/INH AEPB  Inhale 1 puff into the lungs daily. 1 each 11  . glucagon (GLUCAGON EMERGENCY) 1 MG injection Inject 1 mg into the vein once as needed for up to 1 dose. (Patient taking differently: Inject 1 mg into the vein once as needed (for low blood sugars). ) 1 each 12  . glucose blood (PRECISION XTRA TEST STRIPS) test strip Check blood sugar three times a day dx 250.01 270 each 3  . Insulin Syringe-Needle U-100 30G X 3/8" 1 ML MISC Use to inject insulin 3 times daily. 300 each 1  .  ketotifen (ZADITOR) 0.025 % ophthalmic solution Place 1 drop into both eyes 2 (two) times daily as needed (pain).     Marland Kitchen lidocaine-prilocaine (EMLA) cream Apply to Port-A-Cath site 1 hour prior to use 30 g 2  . oxyCODONE (OXY IR/ROXICODONE) 5 MG immediate release tablet Take 1 tablet (5 mg total) by mouth daily as needed for severe pain. 30 tablet 0  . prochlorperazine (COMPAZINE) 5 MG tablet Take 1 tablet (5 mg total) by mouth every 6 (six) hours as needed for nausea or vomiting. 30 tablet 2  . RABEprazole (ACIPHEX) 20 MG tablet TAKE 1 TABLET(20 MG) BY MOUTH TWICE DAILY 180 tablet 1  . rivaroxaban (XARELTO) 20 MG TABS tablet Take 1 tablet (20 mg total) by mouth daily with supper. 30 tablet 0  . sildenafil (REVATIO) 20 MG tablet 1-5 tabs as needed for ED symptoms 100 tablet 11  . topiramate (TOPAMAX) 50 MG tablet 2 in the AM, 1 at night (Patient taking differently: Take 50-100 mg by mouth See admin instructions. 2 in the AM, 1 at night) 270 tablet 1  . [DISCONTINUED] metoprolol succinate (TOPROL-XL) 25 MG 24 hr tablet Take 25 mg by mouth daily.      No current facility-administered medications on file prior to visit.     No Known Allergies  Family History  Problem Relation Age of Onset  . Stroke Mother   . Diabetes Mother   . Diabetes Maternal Grandmother        mother side of the family aunts, MGF  . Breast cancer Paternal Grandmother   . AAA (abdominal aortic aneurysm) Father   . Colon cancer Neg Hx   . Esophageal  cancer Neg Hx   . Rectal cancer Neg Hx   . Stomach cancer Neg Hx     BP 114/60 (BP Location: Right Arm, Patient Position: Sitting, Cuff Size: Normal)   Pulse 89   Temp 97.9 F (36.6 C) (Oral)   Ht '6\' 4"'$  (1.93 m)   Wt 195 lb (88.5 kg)   SpO2 92%   BMI 23.74 kg/m   Review of Systems He has again lost 10 lbs.  He denies hypoglycemia.      Objective:   Physical Exam VITAL SIGNS:  See vs page GENERAL: no distress Pulses: foot pulses are intact bilaterally.   MSK: no deformity of the feet or ankles.  CV: no edema of the legs or ankles Skin:  no ulcer on the feet or ankles.  normal color and temp on the feet and ankles Neuro: sensation is intact to touch on the feet and ankles.   Ext: There is bilateral onychomycosis of the toenails.   Lab Results  Component Value Date   HGBA1C 7.8 (A) 03/17/2018        Assessment & Plan:  Insulin-requiring type 2 DM, with renal insuff: this is the best control this pt should aim for, given this regimen, which does match insulin to his changing needs throughout the day Esophageal cancer: he still needs more insulin on chemo days.   Weight loss: we discussed the fact that as this could change, he would need to continue to monitor cbg's.  Patient Instructions  Please continue the same insulin: 70 units with breakfast, and 100 units on chemo days.  On this type of insulin schedule, you should eat meals on a regular schedule.  If a meal is missed or significantly delayed, your blood sugar could go low. check your blood sugar twice a day.  vary  the time of day when you check, between before the 3 meals, and at bedtime.  also check if you have symptoms of your blood sugar being too high or too low.  please keep a record of the readings and bring it to your next appointment here (or you can bring the meter itself).  You can write it on any piece of paper.  please call us sooner if your blood sugar goes below 70, or if you have a lot of readings over  200.   Please come back for a follow-up appointment in 2 months.

## 2018-03-17 NOTE — Patient Instructions (Addendum)
Please continue the same insulin: 70 units with breakfast, and 100 units on chemo days.  On this type of insulin schedule, you should eat meals on a regular schedule.  If a meal is missed or significantly delayed, your blood sugar could go low. check your blood sugar twice a day.  vary the time of day when you check, between before the 3 meals, and at bedtime.  also check if you have symptoms of your blood sugar being too high or too low.  please keep a record of the readings and bring it to your next appointment here (or you can bring the meter itself).  You can write it on any piece of paper.  please call us sooner if your blood sugar goes below 70, or if you have a lot of readings over 200.   Please come back for a follow-up appointment in 2 months.

## 2018-03-18 ENCOUNTER — Ambulatory Visit: Payer: Medicare Other | Admitting: Physical Therapy

## 2018-03-18 ENCOUNTER — Ambulatory Visit: Payer: Medicare Other | Admitting: Occupational Therapy

## 2018-03-18 ENCOUNTER — Encounter: Payer: Self-pay | Admitting: Physical Therapy

## 2018-03-18 DIAGNOSIS — R2689 Other abnormalities of gait and mobility: Secondary | ICD-10-CM | POA: Diagnosis not present

## 2018-03-18 DIAGNOSIS — M6281 Muscle weakness (generalized): Secondary | ICD-10-CM | POA: Diagnosis not present

## 2018-03-18 DIAGNOSIS — M25611 Stiffness of right shoulder, not elsewhere classified: Secondary | ICD-10-CM | POA: Diagnosis not present

## 2018-03-18 DIAGNOSIS — R41844 Frontal lobe and executive function deficit: Secondary | ICD-10-CM | POA: Diagnosis not present

## 2018-03-18 DIAGNOSIS — R2681 Unsteadiness on feet: Secondary | ICD-10-CM | POA: Diagnosis not present

## 2018-03-18 DIAGNOSIS — R278 Other lack of coordination: Secondary | ICD-10-CM

## 2018-03-18 NOTE — Therapy (Signed)
[facility] [address] [place], [place], [address] Phone: [phone]   Fax:  [phone]  Occupational Therapy Treatment  Patient Details  Name: Ronald Johnson MRN: [id number] Date of Birth: 07/24/1946 Referring Provider: Dr. Irine Seal [name], MD is PCP)   Encounter Date: 03/18/2018  OT End of Session - 03/18/18 1218    Visit Number  4    Number of Visits  17    Date for OT Re-Evaluation  05/02/18    Authorization Type  Medicare    Authorization Time Period  POC 2x week x 8 weeks, cert 90 days- to 73/71/06    Authorization - Visit Number  4    Authorization - Number of Visits  10    OT Start Time  1018    OT Stop Time  1100    OT Time Calculation (min)  42 min    Activity Tolerance  Patient tolerated treatment well    Behavior During Therapy  [facility] for tasks assessed/performed       Past Medical History:  Diagnosis Date  . Anemia 08/15/2009  . Anxiety   . Arthritis   . [name]'s esophagus 2007  . COLONIC POLYPS, ADENOMATOUS 08/01/2008, 2013, 2014  . COPD (chronic obstructive pulmonary disease) (HCC)    no per pt  . Depression   . Diabetes type 2, controlled ([place]) 03/13/2007  . Diverticulitis   . ED (erectile dysfunction)   . EMPHYSEMA 11/08/2008   no per pt  . Esophageal cancer ([place]) "dx'd ~ 01/2015"  . GERD 03/13/2007  . GIB (gastrointestinal bleeding) 08/18/2015  . Hiatal hernia   . History of blood transfusion 08/18/2015   due to GIB  . HYPERCHOLESTEROLEMIA 02/03/2008  . HYPERTENSION 03/13/2007   pt denies, claims white coat syndrome  . Pneumonia   . Prostatism   . PULMONARY NODULE 11/24/2008  . Stomach cancer ([place]) "dx'd ~ 01/2015"  . TOBACCO ABUSE 11/24/2008    Past Surgical History:  Procedure Laterality Date  . BALLOON DILATION N/A 03/19/2016   Procedure: BALLOON DILATION;  Surgeon: Irene Shipper, MD;  [facility];  Service: Endoscopy;  Laterality: N/A;  . CIRCUMCISION    .  COLONOSCOPY    . COMPLETE ESOPHAGECTOMY N/A 07/25/2015   Procedure: TRANSHIATAL TOTAL ESOPHAGECTOMY COMPLETE PYLOROMYOTOMY;  Surgeon: [name], MD;  Location: [facility];  Service: Thoracic;  Laterality: N/A;  . EGD  11/19/2005  . ESOPHAGOGASTRODUODENOSCOPY N/A 03/19/2016   Procedure: ESOPHAGOGASTRODUODENOSCOPY (EGD);  Surgeon: Irene Shipper, MD;  Location: [facility];  Service: Endoscopy;  Laterality: N/A;  . ESOPHAGOGASTRODUODENOSCOPY (EGD) WITH PROPOFOL N/A 09/15/2017   Procedure: ESOPHAGOGASTRODUODENOSCOPY (EGD) WITH PROPOFOL;  Surgeon: Irene Shipper, MD;  [facility];  Service: Endoscopy;  Laterality: N/A;  . EUS N/A 03/16/2015   Procedure: UPPER ENDOSCOPIC ULTRASOUND (EUS) RADIAL;  Surgeon: Milus Banister, MD;  [facility];  Service: Endoscopy;  Laterality: N/A;  . FOOT FRACTURE SURGERY Right 1980   right foot w/ pins and screws  . IR IMAGING GUIDED PORT INSERTION  01/05/2018  . IR US GUIDE VASC ACCESS LEFT  01/05/2018  . JEJUNOSTOMY N/A 07/25/2015   Procedure: FEEDING JEJUNOSTOMY;  Surgeon: [name], MD;  Location: [facility];  Service: Thoracic;  Laterality: N/A;  . MICROLARYNGOSCOPY W/VOCAL CORD INJECTION N/A 01/22/2018   Procedure: MICROLARYNGOSCOPY WITH VOCAL CORD INJECTION;  Surgeon: Melissa Montane, MD;  Location: [facility];  Service: ENT;  Laterality: N/A;  . POLYPECTOMY    .  removal pins and screws foot  1980   right foot  . UPPER GASTROINTESTINAL ENDOSCOPY    . VASECTOMY    . VIDEO BRONCHOSCOPY N/A 07/25/2015   Procedure: VIDEO BRONCHOSCOPY;  Surgeon: [name], MD;  Location: [facility];  Service: Thoracic;  Laterality: N/A;    There were no vitals filed for this visit.  Subjective Assessment - 03/18/18 1044    Patient is accompained by:  Family member wife    Pertinent History  PMH esophageal CA, undergoing chemo, hx of septic shock, pleural effusion, DM, pancytopenia, HTN, bradycardia, PE, dysphagia    Limitations  soft diet, active CA, 2L O2     Patient Stated Goals  to get back to doing what I was doing before     Currently in Pain?  Yes    Pain Score  6     Pain Location  Shoulder    Pain Orientation  Right    Pain Descriptors / Indicators  Aching;Tightness    Pain Type  Chronic pain    Pain Onset  More than a month ago    Pain Frequency  Intermittent    Aggravating Factors   only w/ ER and shoulder abduction    Pain Relieving Factors  rest         TREATMENT:  Supine (UB/head elevated on wedge): reviewed previously issued cane HEP - pt demo each x 10 reps. Shoulder abd. X 10 reps each way with pain Rt side (d/t frozen shoulder) but able to reduce pain with more scaption range. ER w/ arms in abduction w/ pain and tightness Rt shoulder (d/t frozen shoulder). Pt shown stretch for ER supine (butterfly stretch) and told to perform 3-5 reps, holding 10 sec.  Seated: shoulder ladder up/down x 5 times w/ dowel (BUE's). Followed by functional high level reaching RUE to place/remove shower rings on highest prong of shoulder ladder, repeated activity with LUE. Reviewed/clarified one ex from coordination HEP (flipping cards) - cued pt not to hike shoulder when flipping cards over                    OT Short Term Goals - 03/03/18 1301      [place] #1   Title  I with HEP for strength and coordination due 04/02/18    Time  4    Period  Weeks    Status  New    Target Date  04/02/18      OT SHORT TERM GOAL #2   Title  Pt will perfrom all basic ADLS with supervision.    Time  4    Period  Weeks    Status  New      OT SHORT TERM GOAL #3   Title  Pt will demonstrate ability to retrieve a lightweight object at 110 shoulder flexion with RUE.    Time  4    Period  Weeks    Status  New      OT SHORT TERM GOAL #4   Title  Pt will demonstrate ability to perform functional activites in standing x 15 mins prior to rest break with no LOB.    Time  4    Period  Weeks    Status  New      OT SHORT TERM GOAL #5    Title  Pt / wife will verbalize understanding of memory compensations and ways to keep thinking skills sharp.    Time  4  Period  Weeks    Status  New      Additional Short Term Goals   Additional Short Term Goals  Yes      OT SHORT TERM GOAL #6   Title  Pt will perfrom tub bench transfer with supervision.    Time  4    Period  Weeks    Status  New        OT Long Term Goals - 03/03/18 1305      OT LONG TERM GOAL #1   Title  I with updated HEP. due 05/02/18    Time  8    Period  Weeks    Status  New    Target Date  05/02/18      OT LONG TERM GOAL #2   Title  Pt will perform basic ADLs modified independently.    Time  8    Period  Weeks    Status  New      OT LONG TERM GOAL #3   Title  Pt will demonstrate ability to perform functional activity in standing x 25 mins prior to rest break without LOB.    Time  8    Period  Weeks    Status  New      OT LONG TERM GOAL #4   Title  Further assess cogntion and set goal prn    Time  8    Period  Weeks    Status  New      OT LONG TERM GOAL #5   Title  Pt will demonstrate adequate RUE strength to retrieve 5 lbs weight from overhead shelf at 110 shouler flexion x 5 without dropping.    Time  8    Period  Weeks    Status  New      Long Term Additional Goals   Additional Long Term Goals  Yes      OT LONG TERM GOAL #6   Title  Pt will perfrom basic home management, beverage prep at a modified independent level    Time  4    Period  Weeks    Status  New            Plan - 03/18/18 1219    Clinical Impression Statement  Pt continues to progress towards goals and UB endurance. Pt fatigues quickly in standing positions    Occupational Profile and client history currently impacting functional performance  PMH esophageal CA, undergoing chemo, hx of septic shock, pleural effusion, DM, pancytopenia, HTN, bradycardia, PE, dysphagiaPt is retired. Prior level of function I with ADLs and yardwork.    Occupational performance  deficits (Please refer to evaluation for details):  ADL's;IADL's;Leisure;Social Participation    Rehab Potential  Fair    Current Impairments/barriers affecting progress:  severity of medial issues, ongoing chemotherapy, cogntive deficits, decreased endurance    OT Frequency  2x / week    OT Duration  8 weeks    OT Treatment/Interventions  Self-care/ADL training;Therapeutic exercise;Patient/family education;Neuromuscular education;Paraffin;Moist Heat;Fluidtherapy;Therapeutic activities;Balance training;Cognitive remediation/compensation;Passive range of motion;Manual Therapy;DME and/or AE instruction;Ultrasound;Cryotherapy    Plan  continue endurance and standing tolerance, ADLS LB dressing        Patient will benefit from skilled therapeutic intervention in order to improve the following deficits and impairments:  Abnormal gait, Decreased mobility, Decreased strength, Decreased range of motion, Decreased endurance, Decreased activity tolerance, Decreased balance, Decreased knowledge of precautions, Decreased safety awareness, Difficulty walking, Impaired UE functional use, Decreased coordination, Cardiopulmonary status limiting  activity, Decreased cognition, Decreased knowledge of use of DME, Impaired flexibility  Visit Diagnosis: Muscle weakness (generalized)  Other lack of coordination    Problem List Patient Active Problem List   Diagnosis Date Noted  . Septic shock ([place])   . Severe sepsis with septic shock ([place]) 02/26/2018  . Pleural effusion on right 02/26/2018  . Pancytopenia ([place])   . Port-A-Cath in place 01/08/2018  . Goals of care, counseling/discussion 12/25/2017  . Secondary malignant neoplasm of cervical lymph node ([place]) 09/30/2017  . Headache 06/30/2017  . Abnormal ECG 06/18/2017  . Tremor 05/05/2017  . Erectile dysfunction 10/28/2016  . Adhesive capsulitis of right shoulder associated with type 2 diabetes mellitus ([place]) 09/02/2016  . Esophageal stricture   .  Dysphagia 01/23/2016  . Insulin dependent diabetes mellitus ([place]) 01/23/2016  . Controlled type 2 diabetes mellitus with diabetic peripheral angiopathy without gangrene, with long-term current use of insulin ([place]) 10/30/2015  . Fever 09/01/2015  . Diabetes mellitus due to underlying condition, uncontrolled, with circulatory complication ([place])   . Chronic diastolic heart failure ([place])   . Anemia requiring transfusions   . Hx pulmonary embolism   . Elevated troponin   . GI bleed 08/18/2015  . Symptomatic anemia 08/18/2015  . Pulmonary embolism ([place]) 08/18/2015  . HLD (hyperlipidemia) 08/18/2015  . GIB (gastrointestinal bleeding) 08/18/2015  . Gastrointestinal hemorrhage with melena   . Esophageal cancer ([place]) 07/25/2015  . LUQ abdominal pain 07/07/2015  . 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 Chest pain 12/02/2014  . Quit smoking within past year 11/04/2013  . Screening for prostate cancer 11/04/2013  . Encounter for long-term (current) use of other medications 11/04/2013  . Routine general medical examination at a health care facility 11/08/2012  . Gastroenteritis 09/10/2011  . PROTEINURIA, MILD 08/15/2009  . ANXIETY STATE, UNSPECIFIED 12/15/2008  . PULMONARY NODULE 11/24/2008  . EMPHYSEMA 11/08/2008  . COLONIC POLYPS, ADENOMATOUS 08/01/2008  . UNSPECIFIED INFLAMMATORY AND TOXIC NEUROPATHY 05/19/2008  . CHEST PAIN, PLEURITIC 05/19/2008  . HYPERCHOLESTEROLEMIA 02/03/2008  . Depression with anxiety 03/13/2007  . Essential hypertension 03/13/2007  . GERD 03/13/2007    Carey Bullocks, OTR/L 03/18/2018, 12:21 PM  [facility] [address] [place], [place], [address] Phone: [phone]   Fax:  [phone]  Name: Ronald Johnson MRN: [id number] Date of Birth: 11/03/1945

## 2018-03-18 NOTE — Therapy (Signed)
[facility] [address] [place], [place], [address] Phone: [phone]   Fax:  [phone]  Physical Therapy Treatment  Patient Details  Name: Ronald Johnson MRN: [id number] Date of Birth: 02-May-1946 Referring Provider: Dr. Irine Seal [name], MD is PCP)   Encounter Date: 03/18/2018  PT End of Session - 03/18/18 1102    Visit Number  4    Number of Visits  17    Date for PT Re-Evaluation  05/03/18    Authorization Type  Medicare and [employer]    Authorization Time Period  03-03-18 - 06-01-18    PT Start Time  1101    PT Stop Time  1139    PT Time Calculation (min)  38 min    Equipment Utilized During Treatment  Gait belt;Oxygen    Activity Tolerance  Patient tolerated treatment well;Patient limited by fatigue    Behavior During Therapy  [facility] for tasks assessed/performed       Past Medical History:  Diagnosis Date  . Anemia 08/15/2009  . Anxiety   . Arthritis   . [name]'s esophagus 2007  . COLONIC POLYPS, ADENOMATOUS 08/01/2008, 2013, 2014  . COPD (chronic obstructive pulmonary disease) (HCC)    no per pt  . Depression   . Diabetes type 2, controlled ([place]) 03/13/2007  . Diverticulitis   . ED (erectile dysfunction)   . EMPHYSEMA 11/08/2008   no per pt  . Esophageal cancer ([place]) "dx'd ~ 01/2015"  . GERD 03/13/2007  . GIB (gastrointestinal bleeding) 08/18/2015  . Hiatal hernia   . History of blood transfusion 08/18/2015   due to GIB  . HYPERCHOLESTEROLEMIA 02/03/2008  . HYPERTENSION 03/13/2007   pt denies, claims white coat syndrome  . Pneumonia   . Prostatism   . PULMONARY NODULE 11/24/2008  . Stomach cancer ([place]) "dx'd ~ 01/2015"  . TOBACCO ABUSE 11/24/2008    Past Surgical History:  Procedure Laterality Date  . BALLOON DILATION N/A 03/19/2016   Procedure: BALLOON DILATION;  Surgeon: Irene Shipper, MD;  [facility];  Service: Endoscopy;  Laterality: N/A;  . CIRCUMCISION    . COLONOSCOPY     . COMPLETE ESOPHAGECTOMY N/A 07/25/2015   Procedure: TRANSHIATAL TOTAL ESOPHAGECTOMY COMPLETE PYLOROMYOTOMY;  Surgeon: [name], MD;  Location: [facility];  Service: Thoracic;  Laterality: N/A;  . EGD  11/19/2005  . ESOPHAGOGASTRODUODENOSCOPY N/A 03/19/2016   Procedure: ESOPHAGOGASTRODUODENOSCOPY (EGD);  Surgeon: Irene Shipper, MD;  [facility];  Service: Endoscopy;  Laterality: N/A;  . ESOPHAGOGASTRODUODENOSCOPY (EGD) WITH PROPOFOL N/A 09/15/2017   Procedure: ESOPHAGOGASTRODUODENOSCOPY (EGD) WITH PROPOFOL;  Surgeon: Irene Shipper, MD;  [facility];  Service: Endoscopy;  Laterality: N/A;  . EUS N/A 03/16/2015   Procedure: UPPER ENDOSCOPIC ULTRASOUND (EUS) RADIAL;  Surgeon: Milus Banister, MD;  [facility];  Service: Endoscopy;  Laterality: N/A;  . FOOT FRACTURE SURGERY Right 1980   right foot w/ pins and screws  . IR IMAGING GUIDED PORT INSERTION  01/05/2018  . IR US GUIDE VASC ACCESS LEFT  01/05/2018  . JEJUNOSTOMY N/A 07/25/2015   Procedure: FEEDING JEJUNOSTOMY;  Surgeon: [name], MD;  Location: [facility];  Service: Thoracic;  Laterality: N/A;  . MICROLARYNGOSCOPY W/VOCAL CORD INJECTION N/A 01/22/2018   Procedure: MICROLARYNGOSCOPY WITH VOCAL CORD INJECTION;  Surgeon: Melissa Montane, MD;  Location: [facility];  Service: ENT;  Laterality: N/A;  . POLYPECTOMY    . removal pins and screws foot  1980  right foot  . UPPER GASTROINTESTINAL ENDOSCOPY    . VASECTOMY    . VIDEO BRONCHOSCOPY N/A 07/25/2015   Procedure: VIDEO BRONCHOSCOPY;  Surgeon: [name], MD;  Location[facility];  Service: Thoracic;  Laterality: N/A;    There were no vitals filed for this visit.  Subjective Assessment - 03/18/18 1102    Subjective  No new complaints. Reports feeling better today. No falls or pain to report.    Patient is accompained by:  Family member    Pertinent History  pt currently getting chemo; on 2L O2; severe sepsis with septic shock (likely due to Rt pleural  effusion):  bradycardia:  HTN:  GE junction carcinoma:  IDDM    Patient Stated Goals  "get back to where I was at" - improve endurance- walk without RW    Currently in Pain?  No/denies    Pain Score  0-No pain           [facility] Adult PT Treatment/Exercise - 03/18/18 0001      Ambulation/Gait   Ambulation/Gait  Yes    Ambulation/Gait Assistance  5: Supervision    Ambulation/Gait Assistance Details  cues on posture and walker position needed with gait.     Ambulation Distance (Feet)  60 Feet    Assistive device  Rolling walker    Gait Pattern  Step-through pattern    Ambulation Surface  Level;Indoor      Knee/Hip Exercises: Standing   Heel Raises  Both;1 set;10 reps;5 seconds;Limitations    Heel Raises Limitations  with UE support on chair back, decreased heel lift noted with last few reps due to fatigue.     Knee Flexion  AROM;Strengthening;Both;1 set;Other (comment);Limitations 4 reps each leg    Knee Flexion Limitations  with UE support on tall walker, alternating legs. incr fatigue after 4 reps, needing to sit down to rest.     Hip Flexion  AROM;Stengthening;Both;1 set;10 reps;Knee bent;Limitations    Hip Flexion Limitations  with UE support on tall walker, alternating LE's. cues on posture and ex form.     Hip Abduction  AROM;Stengthening;Left;1 set;10 reps;Knee straight;Limitations    Abduction Limitations  with tall walker support, alternating legs. cues on posture and ex form.       Knee/Hip Exercises: Seated   Long Arc Quad  AROM;Strengthening;Both;1 set;10 reps;Weights;Limitations    Long Arc Quad Weight  2 lbs.    Marching  AROM;Strengthening;Both;1 set;10 reps;Weights;Limitations    Marching Weights  2 lbs.            PT Short Term Goals - 03/14/18 1605      PT SHORT TERM GOAL #1   Title  Pt will demonstrate increased LE strength so he is able to perform sit to stand 5 times from mat without UE support. (all STGs due by 04/03/18)    Time  4    Period  Weeks     Status  New      PT SHORT TERM GOAL #2   Title  Improve Berg balance test score by at least 4 points to reduce fall risk.    Baseline  03/13/18: 46/56 was baseline value today    Time  4    Period  Weeks    Status  New      PT SHORT TERM GOAL #3   Title  Improve gait velocity from 1.52 ft/sec (21.59 secs) to >/= 1.9 ft/sec with RW.    Baseline  21.59 secs = 1.52  ft/sec    Time  4    Period  Weeks    Status  New      PT SHORT TERM GOAL #4   Title  Improve TUG score from 46.91 secs to </= 30 secs with RW to demo improved functional mobility.    Baseline  46.91 secs with RW    Time  4    Period  Weeks    Status  New      PT SHORT TERM GOAL #5   Title  Independent in HEP for strengthening, balance and walking program.    Time  4    Period  Weeks    Status  New      PT SHORT TERM GOAL #6   Title  Amb. 100' without RW with SBA without LOB.    Time  4    Period  Weeks    Status  New        PT Long Term Goals - 03/14/18 1605      PT LONG TERM GOAL #1   Title  Improve Berg balance test score by at least 8 points from baseline. (all LTGs due by 05/04/18)    Baseline  03/13/18: 46/56 scored at baseline    Time  8    Period  Weeks    Status  New      PT LONG TERM GOAL #2   Title  Modified independent household ambulation without device.    Time  8    Period  Weeks    Status  New      PT LONG TERM GOAL #3   Title  Improve TUG score from 46.91 to </= 25 secs with RW for reduced fall risk.    Baseline  46.91 secs with RW    Time  8    Period  Weeks    Status  New      PT LONG TERM GOAL #4   Title  Increase gait velocity from 1.52 ft/sec to >/= 2.3 ft/sec with RW for incr. gait efficiency.    Baseline  1.52 ft/sec = 21.59 secs     Time  8    Period  Weeks    Status  New      PT LONG TERM GOAL #5   Title  Increase distance in 3" walk test with RW by at least 100' to demo increased endurance/activity tolerance.     Time  8    Period  Weeks    Status  New             Plan - 03/18/18 1102    Clinical Impression Statement  Today's skilled session continued to focus on strengthening with frequent rest breaks needed due to fatigue and shortness of breath. Pt is making slow progress toward goals and should benefit from continued PT to progress toward unmet goals.     PT Treatment/Interventions  ADLs/Self Care Home Management;DME Instruction;Gait training;Stair training;Functional mobility training;Patient/family education;Neuromuscular re-education;Therapeutic exercise;Therapeutic activities;Balance training    PT Next Visit Plan  work on gait and standing balance activities, Nustep as tolerated - FATIGUES VERY EASILY    Consulted and Agree with Plan of Care  Patient;Family member/caregiver    Family Member Consulted  wife Velta Addison       Patient will benefit from skilled therapeutic intervention in order to improve the following deficits and impairments:     Visit Diagnosis: Muscle weakness (generalized)  Unsteadiness on feet  Other abnormalities of  gait and mobility     Problem List Patient Active Problem List   Diagnosis Date Noted  . Septic shock ([place])   . Severe sepsis with septic shock ([place]) 02/26/2018  . Pleural effusion on right 02/26/2018  . Pancytopenia ([place])   . Port-A-Cath in place 01/08/2018  . Goals of care, counseling/discussion 12/25/2017  . Secondary malignant neoplasm of cervical lymph node ([place]) 09/30/2017  . Headache 06/30/2017  . Abnormal ECG 06/18/2017  . Tremor 05/05/2017  . Erectile dysfunction 10/28/2016  . Adhesive capsulitis of right shoulder associated with type 2 diabetes mellitus ([place]) 09/02/2016  . Esophageal stricture   . Dysphagia 01/23/2016  . Insulin dependent diabetes mellitus ([place]) 01/23/2016  . Controlled type 2 diabetes mellitus with diabetic peripheral angiopathy without gangrene, with long-term current use of insulin ([place]) 10/30/2015  . Fever 09/01/2015  . Diabetes mellitus due to  underlying condition, uncontrolled, with circulatory complication ([place])   . Chronic diastolic heart failure ([place])   . Anemia requiring transfusions   . Hx pulmonary embolism   . Elevated troponin   . GI bleed 08/18/2015  . Symptomatic anemia 08/18/2015  . Pulmonary embolism ([place]) 08/18/2015  . HLD (hyperlipidemia) 08/18/2015  . GIB (gastrointestinal bleeding) 08/18/2015  . Gastrointestinal hemorrhage with melena   . Esophageal cancer ([place]) 07/25/2015  . LUQ abdominal pain 07/07/2015  . 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 Chest pain 12/02/2014  . Quit smoking within past year 11/04/2013  . Screening for prostate cancer 11/04/2013  . Encounter for long-term (current) use of other medications 11/04/2013  . Routine general medical examination at a health care facility 11/08/2012  . Gastroenteritis 09/10/2011  . PROTEINURIA, MILD 08/15/2009  . ANXIETY STATE, UNSPECIFIED 12/15/2008  . PULMONARY NODULE 11/24/2008  . EMPHYSEMA 11/08/2008  . COLONIC POLYPS, ADENOMATOUS 08/01/2008  . UNSPECIFIED INFLAMMATORY AND TOXIC NEUROPATHY 05/19/2008  . CHEST PAIN, PLEURITIC 05/19/2008  . HYPERCHOLESTEROLEMIA 02/03/2008  . Depression with anxiety 03/13/2007  . Essential hypertension 03/13/2007  . GERD 03/13/2007    Willow Ora, PTA, [facility] [address] [place], [place] [address] [phone] 03/18/18, 10:47 PM   Name: Ronald Johnson MRN: [id number] Date of Birth: 1945/11/28

## 2018-03-19 ENCOUNTER — Encounter: Payer: Self-pay | Admitting: Physical Therapy

## 2018-03-19 ENCOUNTER — Telehealth: Payer: Self-pay | Admitting: Oncology

## 2018-03-19 ENCOUNTER — Ambulatory Visit: Payer: Medicare Other | Attending: Internal Medicine | Admitting: Occupational Therapy

## 2018-03-19 ENCOUNTER — Ambulatory Visit: Payer: Medicare Other | Admitting: Physical Therapy

## 2018-03-19 VITALS — BP 112/65 | HR 92

## 2018-03-19 DIAGNOSIS — R278 Other lack of coordination: Secondary | ICD-10-CM | POA: Diagnosis not present

## 2018-03-19 DIAGNOSIS — R2689 Other abnormalities of gait and mobility: Secondary | ICD-10-CM | POA: Insufficient documentation

## 2018-03-19 DIAGNOSIS — M6281 Muscle weakness (generalized): Secondary | ICD-10-CM

## 2018-03-19 DIAGNOSIS — R2681 Unsteadiness on feet: Secondary | ICD-10-CM | POA: Diagnosis not present

## 2018-03-19 NOTE — Patient Instructions (Signed)
1. Grip Strengthening (Resistive Putty)   Squeeze putty using thumb and all fingers. Repeat _20___ times. Do __2__ sessions per day.   2. Roll putty into tube on table and pinch between each finger and thumb x 10 reps each. (can do ring and small finger together)   Table slides slide a towel along the table top with both hands forwards and back 10-20 reps 1-2 x day   Copyright  [employer]. All rights reserved.

## 2018-03-19 NOTE — Telephone Encounter (Signed)
Per 8/1 schedule message added pump w/injectiion 8/7. Spoke with patient and he will get an updated schedule 8/5.

## 2018-03-19 NOTE — Therapy (Signed)
[facility] [address] [place], [place], [address] Phone: [phone]   Fax:  [phone]  Occupational Therapy Treatment  Patient Details  Name: Ronald Johnson MRN: [id number] Date of Birth: Aug 10, 1946 Referring Provider: Dr. Irine Seal [name], MD is PCP)   Encounter Date: 03/19/2018  OT End of Session - 03/19/18 1331    Visit Number  5    Number of Visits  17    Date for OT Re-Evaluation  05/02/18    Authorization Type  Medicare    Authorization Time Period  POC 2x week x 8 weeks, cert 90 days- to 44/31/54    Authorization - Visit Number  5    Authorization - Number of Visits  10    OT Start Time  1318    OT Stop Time  1400    OT Time Calculation (min)  42 min    Activity Tolerance  Patient tolerated treatment well    Behavior During Therapy  [facility] for tasks assessed/performed       Past Medical History:  Diagnosis Date  . Anemia 08/15/2009  . Anxiety   . Arthritis   . [name]'s esophagus 2007  . COLONIC POLYPS, ADENOMATOUS 08/01/2008, 2013, 2014  . COPD (chronic obstructive pulmonary disease) (HCC)    no per pt  . Depression   . Diabetes type 2, controlled ([place]) 03/13/2007  . Diverticulitis   . ED (erectile dysfunction)   . EMPHYSEMA 11/08/2008   no per pt  . Esophageal cancer ([place]) "dx'd ~ 01/2015"  . GERD 03/13/2007  . GIB (gastrointestinal bleeding) 08/18/2015  . Hiatal hernia   . History of blood transfusion 08/18/2015   due to GIB  . HYPERCHOLESTEROLEMIA 02/03/2008  . HYPERTENSION 03/13/2007   pt denies, claims white coat syndrome  . Pneumonia   . Prostatism   . PULMONARY NODULE 11/24/2008  . Stomach cancer ([place]) "dx'd ~ 01/2015"  . TOBACCO ABUSE 11/24/2008    Past Surgical History:  Procedure Laterality Date  . BALLOON DILATION N/A 03/19/2016   Procedure: BALLOON DILATION;  Surgeon: Irene Shipper, MD;  [facility];  Service: Endoscopy;  Laterality: N/A;  . CIRCUMCISION    .  COLONOSCOPY    . COMPLETE ESOPHAGECTOMY N/A 07/25/2015   Procedure: TRANSHIATAL TOTAL ESOPHAGECTOMY COMPLETE PYLOROMYOTOMY;  Surgeon: [name], MD;  Location: [facility];  Service: Thoracic;  Laterality: N/A;  . EGD  11/19/2005  . ESOPHAGOGASTRODUODENOSCOPY N/A 03/19/2016   Procedure: ESOPHAGOGASTRODUODENOSCOPY (EGD);  Surgeon: Irene Shipper, MD;  [facility];  Service: Endoscopy;  Laterality: N/A;  . ESOPHAGOGASTRODUODENOSCOPY (EGD) WITH PROPOFOL N/A 09/15/2017   Procedure: ESOPHAGOGASTRODUODENOSCOPY (EGD) WITH PROPOFOL;  Surgeon: Irene Shipper, MD;  [facility];  Service: Endoscopy;  Laterality: N/A;  . EUS N/A 03/16/2015   Procedure: UPPER ENDOSCOPIC ULTRASOUND (EUS) RADIAL;  Surgeon: Milus Banister, MD;  [facility];  Service: Endoscopy;  Laterality: N/A;  . FOOT FRACTURE SURGERY Right 1980   right foot w/ pins and screws  . IR IMAGING GUIDED PORT INSERTION  01/05/2018  . IR US GUIDE VASC ACCESS LEFT  01/05/2018  . JEJUNOSTOMY N/A 07/25/2015   Procedure: FEEDING JEJUNOSTOMY;  Surgeon: [name], MD;  Location: [place];  Service: Thoracic;  Laterality: N/A;  . MICROLARYNGOSCOPY W/VOCAL CORD INJECTION N/A 01/22/2018   Procedure: MICROLARYNGOSCOPY WITH VOCAL CORD INJECTION;  Surgeon: Melissa Montane, MD;  Location: [facility];  Service: ENT;  Laterality: N/A;  . POLYPECTOMY    .  removal pins and screws foot  1980   right foot  . UPPER GASTROINTESTINAL ENDOSCOPY    . VASECTOMY    . VIDEO BRONCHOSCOPY N/A 07/25/2015   Procedure: VIDEO BRONCHOSCOPY;  Surgeon: [name], MD;  [facility];  Service: Thoracic;  Laterality: N/A;    Vitals:   03/19/18 1323  BP: 112/65  Pulse: 92  SpO2: 94%    Subjective Assessment - 03/19/18 1329    Subjective   Pt reports not feeling well today and being very tired,     Pertinent History  PMH esophageal CA, undergoing chemo, hx of septic shock, pleural effusion, DM, pancytopenia, HTN, bradycardia, PE, dysphagia     Patient Stated Goals  to get back to doing what I was doing before     Currently in Pain?  Yes    Pain Score  5     Pain Location  Rectum    Pain Descriptors / Indicators  Burning    Pain Type  Acute pain    Pain Onset  In the past 7 days    Aggravating Factors   constipation    Pain Relieving Factors  unknown    Multiple Pain Sites  No         [facility] OT Assessment - 03/19/18 1347      Hand Function   Right Hand Grip (lbs)  60    Left Hand Grip (lbs)  68            Treatment: Pt arrived today reporting he is not feeling well. Vitals were WNLs.  Pt requested to perform activities that would be less fatiguing since he had therapy yesterday and he is now fatigued. Seated at table copying small peg design with bilateral UE's  (As pt has decreased coordination for ADLs), mod v.c required for correct design             OT Short Term Goals - 03/03/18 1301      OT SHORT TERM GOAL #1   Title  I with HEP for strength and coordination due 04/02/18    Time  4    Period  Weeks    Status  New    Target Date  04/02/18      OT SHORT TERM GOAL #2   Title  Pt will perfrom all basic ADLS with supervision.    Time  4    Period  Weeks    Status  New      OT SHORT TERM GOAL #3   Title  Pt will demonstrate ability to retrieve a lightweight object at 110 shoulder flexion with RUE.    Time  4    Period  Weeks    Status  New      OT SHORT TERM GOAL #4   Title  Pt will demonstrate ability to perform functional activites in standing x 15 mins prior to rest break with no LOB.    Time  4    Period  Weeks    Status  New      OT SHORT TERM GOAL #5   Title  Pt / wife will verbalize understanding of memory compensations and ways to keep thinking skills sharp.    Time  4    Period  Weeks    Status  New      Additional Short Term Goals   Additional Short Term Goals  Yes      OT SHORT TERM GOAL #6   Title  Pt will perfrom tub bench transfer with supervision.    Time  4     Period  Weeks    Status  New        OT Long Term Goals - 03/03/18 1305      OT LONG TERM GOAL #1   Title  I with updated HEP. due 05/02/18    Time  8    Period  Weeks    Status  New    Target Date  05/02/18      OT LONG TERM GOAL #2   Title  Pt will perform basic ADLs modified independently.    Time  8    Period  Weeks    Status  New      OT LONG TERM GOAL #3   Title  Pt will demonstrate ability to perform functional activity in standing x 25 mins prior to rest break without LOB.    Time  8    Period  Weeks    Status  New      OT LONG TERM GOAL #4   Title  Further assess cogntion and set goal prn    Time  8    Period  Weeks    Status  New      OT LONG TERM GOAL #5   Title  Pt will demonstrate adequate RUE strength to retrieve 5 lbs weight from overhead shelf at 110 shouler flexion x 5 without dropping.    Time  8    Period  Weeks    Status  New      Long Term Additional Goals   Additional Long Term Goals  Yes      OT LONG TERM GOAL #6   Title  Pt will perfrom basic home management, beverage prep at a modified independent level    Time  4    Period  Weeks    Status  New            Plan - 03/19/18 1416    Clinical Impression Statement  Pt is progressing towards goals. Pt reports not feeling well today. He has been busy with chemo and has had therapy on 2 consecutive days.    Occupational Profile and client history currently impacting functional performance  PMH esophageal CA, undergoing chemo, hx of septic shock, pleural effusion, DM, pancytopenia, HTN, bradycardia, PE, dysphagiaPt is retired. Prior level of function I with ADLs and yardwork.    Occupational performance deficits (Please refer to evaluation for details):  ADL's;IADL's;Leisure;Social Participation    Rehab Potential  Fair    Current Impairments/barriers affecting progress:  severity of medial issues, ongoing chemotherapy, cogntive deficits, decreased endurance    OT Frequency  2x / week    OT  Duration  8 weeks    OT Treatment/Interventions  Self-care/ADL training;Therapeutic exercise;Patient/family education;Neuromuscular education;Paraffin;Moist Heat;Fluidtherapy;Therapeutic activities;Balance training;Cognitive remediation/compensation;Passive range of motion;Manual Therapy;DME and/or AE instruction;Ultrasound;Cryotherapy    Plan  continue endurance and standing tolerance, ADLS LB dressing     Consulted and Agree with Plan of Care  Patient;Family member/caregiver    Family Member Consulted  wife       Patient will benefit from skilled therapeutic intervention in order to improve the following deficits and impairments:  Abnormal gait, Decreased mobility, Decreased strength, Decreased range of motion, Decreased endurance, Decreased activity tolerance, Decreased balance, Decreased knowledge of precautions, Decreased safety awareness, Difficulty walking, Impaired UE functional use, Decreased coordination, Cardiopulmonary status limiting activity, Decreased cognition, Decreased knowledge of use of  DME, Impaired flexibility  Visit Diagnosis: Muscle weakness (generalized)  Unsteadiness on feet  Other abnormalities of gait and mobility  Other lack of coordination    Problem List Patient Active Problem List   Diagnosis Date Noted  . Septic shock ([place])   . Severe sepsis with septic shock ([place]) 02/26/2018  . Pleural effusion on right 02/26/2018  . Pancytopenia ([place])   . Port-A-Cath in place 01/08/2018  . Goals of care, counseling/discussion 12/25/2017  . Secondary malignant neoplasm of cervical lymph node ([place]) 09/30/2017  . Headache 06/30/2017  . Abnormal ECG 06/18/2017  . Tremor 05/05/2017  . Erectile dysfunction 10/28/2016  . Adhesive capsulitis of right shoulder associated with type 2 diabetes mellitus ([place]) 09/02/2016  . Esophageal stricture   . Dysphagia 01/23/2016  . Insulin dependent diabetes mellitus ([place]) 01/23/2016  . Controlled type 2 diabetes mellitus with  diabetic peripheral angiopathy without gangrene, with long-term current use of insulin ([place]) 10/30/2015  . Fever 09/01/2015  . Diabetes mellitus due to underlying condition, uncontrolled, with circulatory complication ([place])   . Chronic diastolic heart failure ([place])   . Anemia requiring transfusions   . Hx pulmonary embolism   . Elevated troponin   . GI bleed 08/18/2015  . Symptomatic anemia 08/18/2015  . Pulmonary embolism ([place]) 08/18/2015  . HLD (hyperlipidemia) 08/18/2015  . GIB (gastrointestinal bleeding) 08/18/2015  . Gastrointestinal hemorrhage with melena   . Esophageal cancer ([place]) 07/25/2015  . LUQ abdominal pain 07/07/2015  . 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 Chest pain 12/02/2014  . Quit smoking within past year 11/04/2013  . Screening for prostate cancer 11/04/2013  . Encounter for long-term (current) use of other medications 11/04/2013  . Routine general medical examination at a health care facility 11/08/2012  . Gastroenteritis 09/10/2011  . PROTEINURIA, MILD 08/15/2009  . ANXIETY STATE, UNSPECIFIED 12/15/2008  . PULMONARY NODULE 11/24/2008  . EMPHYSEMA 11/08/2008  . COLONIC POLYPS, ADENOMATOUS 08/01/2008  . UNSPECIFIED INFLAMMATORY AND TOXIC NEUROPATHY 05/19/2008  . CHEST PAIN, PLEURITIC 05/19/2008  . HYPERCHOLESTEROLEMIA 02/03/2008  . Depression with anxiety 03/13/2007  . Essential hypertension 03/13/2007  . GERD 03/13/2007    Brandyn Thien 03/19/2018, 2:22 PM  [facility] [address] [place], [place], [address] Phone: [phone]   Fax:  [phone]  Name: DONNEL VENUTO MRN: [id number] Date of Birth: 1945/08/30

## 2018-03-19 NOTE — Therapy (Signed)
[facility] [address] [place], [place], [address] Phone: [phone]   Fax:  [phone]  Physical Therapy Treatment  Patient Details  Name: KARO ROG MRN: [id number] Date of Birth: 1945/08/25 Referring Provider: Dr. Irine Seal [name], MD is PCP)   Encounter Date: 03/19/2018  PT End of Session - 03/19/18 1920    Visit Number  5    Number of Visits  17    Date for PT Re-Evaluation  05/03/18    Authorization Type  Medicare and [employer]    Authorization Time Period  03-03-18 - 06-01-18    PT Start Time  1400    PT Stop Time  3976 pt requesting to stop due to fatigue    PT Time Calculation (min)  24 min    Equipment Utilized During Treatment  Gait belt;Oxygen    Activity Tolerance  Patient tolerated treatment well;Patient limited by fatigue    Behavior During Therapy  [facility] for tasks assessed/performed       Past Medical History:  Diagnosis Date  . Anemia 08/15/2009  . Anxiety   . Arthritis   . [name]'s esophagus 2007  . COLONIC POLYPS, ADENOMATOUS 08/01/2008, 2013, 2014  . COPD (chronic obstructive pulmonary disease) (HCC)    no per pt  . Depression   . Diabetes type 2, controlled ([place]) 03/13/2007  . Diverticulitis   . ED (erectile dysfunction)   . EMPHYSEMA 11/08/2008   no per pt  . Esophageal cancer ([place]) "dx'd ~ 01/2015"  . GERD 03/13/2007  . GIB (gastrointestinal bleeding) 08/18/2015  . Hiatal hernia   . History of blood transfusion 08/18/2015   due to GIB  . HYPERCHOLESTEROLEMIA 02/03/2008  . HYPERTENSION 03/13/2007   pt denies, claims white coat syndrome  . Pneumonia   . Prostatism   . PULMONARY NODULE 11/24/2008  . Stomach cancer ([place]) "dx'd ~ 01/2015"  . TOBACCO ABUSE 11/24/2008    Past Surgical History:  Procedure Laterality Date  . BALLOON DILATION N/A 03/19/2016   Procedure: BALLOON DILATION;  Surgeon: Irene Shipper, MD;  [facility];  Service: Endoscopy;  Laterality: N/A;  .  CIRCUMCISION    . COLONOSCOPY    . COMPLETE ESOPHAGECTOMY N/A 07/25/2015   Procedure: TRANSHIATAL TOTAL ESOPHAGECTOMY COMPLETE PYLOROMYOTOMY;  Surgeon: [name], MD;  Location: [facility];  Service: Thoracic;  Laterality: N/A;  . EGD  11/19/2005  . ESOPHAGOGASTRODUODENOSCOPY N/A 03/19/2016   Procedure: ESOPHAGOGASTRODUODENOSCOPY (EGD);  Surgeon: Irene Shipper, MD;  [facility];  Service: Endoscopy;  Laterality: N/A;  . ESOPHAGOGASTRODUODENOSCOPY (EGD) WITH PROPOFOL N/A 09/15/2017   Procedure: ESOPHAGOGASTRODUODENOSCOPY (EGD) WITH PROPOFOL;  Surgeon: Irene Shipper, MD;  [facility];  Service: Endoscopy;  Laterality: N/A;  . EUS N/A 03/16/2015   Procedure: UPPER ENDOSCOPIC ULTRASOUND (EUS) RADIAL;  Surgeon: Milus Banister, MD;  [facility];  Service: Endoscopy;  Laterality: N/A;  . FOOT FRACTURE SURGERY Right 1980   right foot w/ pins and screws  . IR IMAGING GUIDED PORT INSERTION  01/05/2018  . IR US GUIDE VASC ACCESS LEFT  01/05/2018  . JEJUNOSTOMY N/A 07/25/2015   Procedure: FEEDING JEJUNOSTOMY;  Surgeon: [name], MD;  Location: [facility];  Service: Thoracic;  Laterality: N/A;  . MICROLARYNGOSCOPY W/VOCAL CORD INJECTION N/A 01/22/2018   Procedure: MICROLARYNGOSCOPY WITH VOCAL CORD INJECTION;  Surgeon: Melissa Montane, MD;  Location: [facility];  Service: ENT;  Laterality: N/A;  . POLYPECTOMY    . removal pins  and screws foot  1980   right foot  . UPPER GASTROINTESTINAL ENDOSCOPY    . VASECTOMY    . VIDEO BRONCHOSCOPY N/A 07/25/2015   Procedure: VIDEO BRONCHOSCOPY;  Surgeon: [name], MD;  [facility];  Service: Thoracic;  Laterality: N/A;    There were no vitals filed for this visit.  Subjective Assessment - 03/19/18 1409    Subjective  Tired today. Also had OT prior to PT session on back to back days.  Having rectal pain today. Has appt with primary MD tomorrow to address it.     Patient is accompained by:  Family member    Pertinent History  pt  currently getting chemo; on 2L O2; severe sepsis with septic shock (likely due to Rt pleural effusion):  bradycardia:  HTN:  GE junction carcinoma:  IDDM    Patient Stated Goals  "get back to where I was at" - improve endurance- walk without RW    Currently in Pain?  Yes    Pain Score  6     Pain Location  Rectum    Pain Descriptors / Indicators  Burning    Pain Type  Acute pain    Pain Onset  In the past 7 days    Pain Frequency  Intermittent    Aggravating Factors   constipation    Pain Relieving Factors  unknown           [facility] Adult PT Treatment/Exercise - 03/19/18 1418      Ambulation/Gait   Ambulation/Gait  Yes    Ambulation/Gait Assistance  5: Supervision    Ambulation/Gait Assistance Details  cues on posture and walker position. PTA carried O2 tank entire time.     Ambulation Distance (Feet)  20 Feet x1, 50 x1    Assistive device  Rolling walker    Gait Pattern  Step-through pattern;Decreased stride length;Trunk flexed    Ambulation Surface  Level;Indoor      Knee/Hip Exercises: Seated   Long Arc Quad  AROM;Strengthening;Both;1 set;10 reps;Weights;Limitations    Long Arc Quad Weight  2 lbs.    Long [employer] Limitations  5 count holds, with cues on slow lowering of legs.     Marching  AROM;Strengthening;Both;1 set;10 reps;Weights;Limitations    Marching Limitations  3 count holds, alternating LE's.    Marching Weights  2 lbs.          PT Short Term Goals - 03/14/18 1605      PT SHORT TERM GOAL #1   Title  Pt will demonstrate increased LE strength so he is able to perform sit to stand 5 times from mat without UE support. (all STGs due by 04/03/18)    Time  4    Period  Weeks    Status  New      PT SHORT TERM GOAL #2   Title  Improve Berg balance test score by at least 4 points to reduce fall risk.    Baseline  03/13/18: 46/56 was baseline value today    Time  4    Period  Weeks    Status  New      PT SHORT TERM GOAL #3   Title  Improve gait velocity from  1.52 ft/sec (21.59 secs) to >/= 1.9 ft/sec with RW.    Baseline  21.59 secs = 1.52 ft/sec    Time  4    Period  Weeks    Status  New      PT  SHORT TERM GOAL #4   Title  Improve TUG score from 46.91 secs to </= 30 secs with RW to demo improved functional mobility.    Baseline  46.91 secs with RW    Time  4    Period  Weeks    Status  New      PT SHORT TERM GOAL #5   Title  Independent in HEP for strengthening, balance and walking program.    Time  4    Period  Weeks    Status  New      PT SHORT TERM GOAL #6   Title  Amb. 100' without RW with SBA without LOB.    Time  4    Period  Weeks    Status  New        PT Long Term Goals - 03/14/18 1605      PT LONG TERM GOAL #1   Title  Improve Berg balance test score by at least 8 points from baseline. (all LTGs due by 05/04/18)    Baseline  03/13/18: 46/56 scored at baseline    Time  8    Period  Weeks    Status  New      PT LONG TERM GOAL #2   Title  Modified independent household ambulation without device.    Time  8    Period  Weeks    Status  New      PT LONG TERM GOAL #3   Title  Improve TUG score from 46.91 to </= 25 secs with RW for reduced fall risk.    Baseline  46.91 secs with RW    Time  8    Period  Weeks    Status  New      PT LONG TERM GOAL #4   Title  Increase gait velocity from 1.52 ft/sec to >/= 2.3 ft/sec with RW for incr. gait efficiency.    Baseline  1.52 ft/sec = 21.59 secs     Time  8    Period  Weeks    Status  New      PT LONG TERM GOAL #5   Title  Increase distance in 3" walk test with RW by at least 100' to demo increased endurance/activity tolerance.     Time  8    Period  Weeks    Status  New         Plan - 03/19/18 1921    Clinical Impression Statement  Today's skilled session continued ot focus on strengthening. Pt very limited by fatigue today and shivering from being cold. Application of a blanket did help. After short gait and 2 seated rest breaks pt politely requested to end  early due to fatigue stating " I hate it, but I am too tired and cold". Worked on gait to lobby where pt sat to rest while spouse got car. Pt's SaO2 levels were >/= 93% with session on 2 lpm O2 via [place]. Pt is progressing toward goals, continues to be limited by fatigue. Pt should continue to benefit from continued PT to progress toward unmet goals.     PT Treatment/Interventions  ADLs/Self Care Home Management;DME Instruction;Gait training;Stair training;Functional mobility training;Patient/family education;Neuromuscular re-education;Therapeutic exercise;Therapeutic activities;Balance training    PT Next Visit Plan  work on gait and standing balance activities, Nustep as tolerated - FATIGUES VERY EASILY    Consulted and Agree with Plan of Care  Patient;Family member/caregiver    Family Member Consulted  wife -  [place]       Patient will benefit from skilled therapeutic intervention in order to improve the following deficits and impairments:     Visit Diagnosis: Muscle weakness (generalized)  Unsteadiness on feet  Other abnormalities of gait and mobility     Problem List Patient Active Problem List   Diagnosis Date Noted  . Septic shock ([place])   . Severe sepsis with septic shock ([place]) 02/26/2018  . Pleural effusion on right 02/26/2018  . Pancytopenia ([place])   . Port-A-Cath in place 01/08/2018  . Goals of care, counseling/discussion 12/25/2017  . Secondary malignant neoplasm of cervical lymph node ([place]) 09/30/2017  . Headache 06/30/2017  . Abnormal ECG 06/18/2017  . Tremor 05/05/2017  . Erectile dysfunction 10/28/2016  . Adhesive capsulitis of right shoulder associated with type 2 diabetes mellitus ([place]) 09/02/2016  . Esophageal stricture   . Dysphagia 01/23/2016  . Insulin dependent diabetes mellitus ([place]) 01/23/2016  . Controlled type 2 diabetes mellitus with diabetic peripheral angiopathy without gangrene, with long-term current use of insulin ([place]) 10/30/2015  . Fever 09/01/2015   . Diabetes mellitus due to underlying condition, uncontrolled, with circulatory complication ([place])   . Chronic diastolic heart failure ([place])   . Anemia requiring transfusions   . Hx pulmonary embolism   . Elevated troponin   . GI bleed 08/18/2015  . Symptomatic anemia 08/18/2015  . Pulmonary embolism ([place]) 08/18/2015  . HLD (hyperlipidemia) 08/18/2015  . GIB (gastrointestinal bleeding) 08/18/2015  . Gastrointestinal hemorrhage with melena   . Esophageal cancer ([place]) 07/25/2015  . LUQ abdominal pain 07/07/2015  . 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 Chest pain 12/02/2014  . Quit smoking within past year 11/04/2013  . Screening for prostate cancer 11/04/2013  . Encounter for long-term (current) use of other medications 11/04/2013  . Routine general medical examination at a health care facility 11/08/2012  . Gastroenteritis 09/10/2011  . PROTEINURIA, MILD 08/15/2009  . ANXIETY STATE, UNSPECIFIED 12/15/2008  . PULMONARY NODULE 11/24/2008  . EMPHYSEMA 11/08/2008  . COLONIC POLYPS, ADENOMATOUS 08/01/2008  . UNSPECIFIED INFLAMMATORY AND TOXIC NEUROPATHY 05/19/2008  . CHEST PAIN, PLEURITIC 05/19/2008  . HYPERCHOLESTEROLEMIA 02/03/2008  . Depression with anxiety 03/13/2007  . Essential hypertension 03/13/2007  . GERD 03/13/2007    Willow Ora, PTA, [facility] [address], [place], [place] [address] [phone] 03/19/18, 7:34 PM   Name: MOHAMMED MCANDREW MRN: [id number] Date of Birth: February 26, 1946

## 2018-03-20 ENCOUNTER — Encounter: Payer: Self-pay | Admitting: Primary Care

## 2018-03-20 ENCOUNTER — Ambulatory Visit (INDEPENDENT_AMBULATORY_CARE_PROVIDER_SITE_OTHER)
Admission: RE | Admit: 2018-03-20 | Discharge: 2018-03-20 | Disposition: A | Discharge status: Home / Self Care | Payer: Medicare Other | Source: Ambulatory Visit | Attending: Primary Care | Admitting: Primary Care

## 2018-03-20 ENCOUNTER — Ambulatory Visit (INDEPENDENT_AMBULATORY_CARE_PROVIDER_SITE_OTHER): Payer: Medicare Other | Admitting: Primary Care

## 2018-03-20 ENCOUNTER — Telehealth: Payer: Self-pay | Admitting: Primary Care

## 2018-03-20 VITALS — BP 100/60 | HR 94 | Ht 76.0 in | Wt 194.8 lb

## 2018-03-20 DIAGNOSIS — J438 Other emphysema: Secondary | ICD-10-CM

## 2018-03-20 DIAGNOSIS — J90 Pleural effusion, not elsewhere classified: Secondary | ICD-10-CM

## 2018-03-20 DIAGNOSIS — R05 Cough: Secondary | ICD-10-CM | POA: Diagnosis not present

## 2018-03-20 DIAGNOSIS — C159 Malignant neoplasm of esophagus, unspecified: Secondary | ICD-10-CM | POA: Diagnosis not present

## 2018-03-20 DIAGNOSIS — R0602 Shortness of breath: Secondary | ICD-10-CM | POA: Diagnosis not present

## 2018-03-20 NOTE — Assessment & Plan Note (Signed)
-   Chest x-ray today to evaluate previous right pleural effusion - Completed Augmentin x2 weeks - If pleural effusion is larger, patient does agree to consider thoracentesis

## 2018-03-20 NOTE — Assessment & Plan Note (Signed)
-   Chronic congestion, using mucinex and flutter valve  -  Taking Breo 100 daily as prescribed. Used rescue inhaler once yesterday with improvement - Continues to need home oxygen 2L (02 sat 89% RA at rest, 94% 2L with exertion)

## 2018-03-20 NOTE — Telephone Encounter (Signed)
Needs ultrasound guided right thoracentesis with cell count, chemistry, LDH, protein and cytology.   Please call patient and let him know right side pleural effusion has not improved and I spoke with Dr. Elsworth Soho and he recommends going ahead with thoracentesis. Thanks

## 2018-03-20 NOTE — Progress Notes (Signed)
Reviewed & agree with plan  

## 2018-03-20 NOTE — Telephone Encounter (Signed)
Dr. Elsworth Soho  I saw your patient today for follow-up right pleural effusion. HxCOPD, pulm nodules, esophageal ca. He was admitted to the hospital in July for sepsis believed to be related to new right pleural effusion. He was given zosyn which was later changed to augmentin x2 weeks. Patient did not want any invasive procedures. On chemo for esophageal cancer.   Repeat CXR today showed persistent right effusion and atelectasis. He feels better but is still dyspneic with exertion and still required 2L oxygen. He told me he would consider thoracentesis if needed. Can you take a look at his chart and xray. Let me know what you think  Thanks, Eustaquio Maize NP

## 2018-03-20 NOTE — Patient Instructions (Addendum)
Encourage oral hydration, drink plenty of fluids  Continue using flutter valve 3 times a day.  Mucinex as needed for increased mucus production.   Continue BREO inhaler daily Albuterol rescue inhaler as needed EVERY 6 HOURS for shortness of breath, wheezing or cough   Use 2L oxygen at rest and exertion. Maintain O2 level >90%  Follow up in 6-8 weeks with Dr. Elsworth Soho please

## 2018-03-20 NOTE — Assessment & Plan Note (Signed)
-   Followed by oncology, most recent chemotherapy on July 22.  Last treatment scheduled for Monday, August 5.  Patient will then have follow-up scan with follow up visit on 8/19

## 2018-03-20 NOTE — Progress Notes (Signed)
@Patient  ID: Ronald Johnson, male    DOB: 12-Aug-1946, 72 y.o.   MRN: [id number]  Chief Complaint  Patient presents with  . Follow-up    productive cough green/yellow-started rehab    Referring provider: [name], MD  HPI: 72 year old male, former smoker. Hx COPD, emphysema, PE, diastolic heart failure, pleural effusion, esophageal cancer. Patient of Dr. Elsworth Soho. Recent hospitalization in July for sepsis related to new right pleural effusion. Treated with zosyn which was later changed to 2 weeks of Augmentin. Patient elected not to have a thoracentesis. Needed home oxygen 2L on discharge. Started on xarelto for PE. Continues chemotherapy for esophageal ca, last treatement on 7/22 (received 4/5 treatments).    03/06/2018 Presents today for hospital follow-up.  Patient feels well, his breathing is better than while he was in the hospital.  He does feel weak, continues 2 L home O2 which is new.  He saw his oncologist on Monday and was started on Xarelto for PE.  She will have chemo on Monday 03/09/18, planning on 2 more rounds to complete 5 total.  He has a congested cough and sleeps in a recliner.  Difficult to bring up mucus.  He has 4 days left of Augmentin.  Reports that his swallowing is better. Denies fever or increasing shortness of breath.   He will have PT and OT for the next 8 weeks. Encouraged mucinex and flutter valve use.   03/20/2018 Patient presents today for 2-week follow-up regarding right pleural effusion.  Patient states that he feels "hell of a lot better".  O2 saturation 94% on 2 L. He continues to have a chronic cough with some mucus production, this is not new for him. He has been taking Mucinex and using flutter valve 5 times a day which he thinks has been helping.  He has been sleeping in bed with a wedge pillow behind him. Receiving home PT/OT. Patient does get extremely tired on days he has physical therapy.  He has been doing pretty good with PT, some of the exercises are  hard but he is working on getting stronger. He has been eating and drinking okay, wife thinks he is not drinking as much fluid as he could be.  BP 100/60, this is not abnormal for him.  Denies lightheadedness or dizziness.  Patient had chemo on 7/22, last treatment is scheduled for Monday August 5. He will then have a follow-up scan and office visit with oncology on 8/19.    No Known Allergies  Immunization History  Administered Date(s) Administered  . Influenza Split 05/14/2011, 05/11/2012  . Influenza Whole 05/19/2008, 06/19/2009, 06/18/2010  . Influenza, High Dose Seasonal PF 05/30/2016, 06/10/2017  . Influenza,inj,Quad PF,6+ Mos 08/03/2013, 09/19/2014, 08/05/2015  . Pneumococcal Conjugate-13 09/08/2013  . Pneumococcal Polysaccharide-23 06/20/2003, 08/19/2008, 08/05/2015  . Td 06/20/2003  . Zoster 08/02/2011    Past Medical History:  Diagnosis Date  . Anemia 08/15/2009  . Anxiety   . Arthritis   . [name]'s esophagus 2007  . COLONIC POLYPS, ADENOMATOUS 08/01/2008, 2013, 2014  . COPD (chronic obstructive pulmonary disease) (HCC)    no per pt  . Depression   . Diabetes type 2, controlled ([place]) 03/13/2007  . Diverticulitis   . ED (erectile dysfunction)   . EMPHYSEMA 11/08/2008   no per pt  . Esophageal cancer ([place]) "dx'd ~ 01/2015"  . GERD 03/13/2007  . GIB (gastrointestinal bleeding) 08/18/2015  . Hiatal hernia   . History of blood transfusion 08/18/2015   due to GIB  .  HYPERCHOLESTEROLEMIA 02/03/2008  . HYPERTENSION 03/13/2007   pt denies, claims white coat syndrome  . Pneumonia   . Prostatism   . PULMONARY NODULE 11/24/2008  . Stomach cancer ([place]) "dx'd ~ 01/2015"  . TOBACCO ABUSE 11/24/2008    Tobacco History: Social History   Tobacco Use  Smoking Status Former Smoker  . Packs/day: 1.00  . Years: 35.00  . Pack years: 35.00  . Types: Cigarettes  . Last attempt to quit: 10/31/2008  . Years since quitting: 9.3  Smokeless Tobacco Never Used   Counseling given: Not  Answered   Outpatient Medications Prior to Visit  Medication Sig Dispense Refill  . albuterol (PROVENTIL HFA;VENTOLIN HFA) 108 (90 Base) MCG/ACT inhaler Inhale 2 puffs into the lungs every 6 (six) hours as needed for wheezing or shortness of breath. 1 Inhaler 2  . dextromethorphan-guaiFENesin (MUCINEX DM) 30-600 MG 12hr tablet Take 1 tablet by mouth 2 (two) times daily.    . diazepam (VALIUM) 5 MG tablet Take 5 mg by mouth every 6 (six) hours as needed for anxiety.    Marland Kitchen escitalopram (LEXAPRO) 20 MG tablet Take 1 tablet (20 mg total) by mouth daily. 90 tablet 3  . fluticasone furoate-vilanterol (BREO ELLIPTA) 100-25 MCG/INH AEPB Inhale 1 puff into the lungs daily. 1 each 11  . glucagon (GLUCAGON EMERGENCY) 1 MG injection Inject 1 mg into the vein once as needed for up to 1 dose. (Patient taking differently: Inject 1 mg into the vein once as needed (for low blood sugars). ) 1 each 12  . glucose blood (PRECISION XTRA TEST STRIPS) test strip Check blood sugar three times a day dx 250.01 270 each 3  . insulin NPH-regular Human (HUMULIN 70/30) (70-30) 100 UNIT/ML injection Inject 70-100 Units into the skin daily with breakfast.    . Insulin Syringe-Needle U-100 30G X 3/8" 1 ML MISC Use to inject insulin 3 times daily. 300 each 1  . ketotifen (ZADITOR) 0.025 % ophthalmic solution Place 1 drop into both eyes 2 (two) times daily as needed (pain).     Marland Kitchen lidocaine-prilocaine (EMLA) cream Apply to Port-A-Cath site 1 hour prior to use 30 g 2  . oxyCODONE (OXY IR/ROXICODONE) 5 MG immediate release tablet Take 1 tablet (5 mg total) by mouth daily as needed for severe pain. 30 tablet 0  . prochlorperazine (COMPAZINE) 5 MG tablet Take 1 tablet (5 mg total) by mouth every 6 (six) hours as needed for nausea or vomiting. 30 tablet 2  . RABEprazole (ACIPHEX) 20 MG tablet TAKE 1 TABLET(20 MG) BY MOUTH TWICE DAILY 180 tablet 1  . rivaroxaban (XARELTO) 20 MG TABS tablet Take 1 tablet (20 mg total) by mouth daily with  supper. 30 tablet 0  . sildenafil (REVATIO) 20 MG tablet 1-5 tabs as needed for ED symptoms 100 tablet 11  . topiramate (TOPAMAX) 50 MG tablet 2 in the AM, 1 at night (Patient taking differently: Take 50-100 mg by mouth See admin instructions. 2 in the AM, 1 at night) 270 tablet 1  . metoprolol succinate (TOPROL-XL) 25 MG 24 hr tablet Take 25 mg by mouth daily.      No facility-administered medications prior to visit.     Review of Systems  Review of Systems  Constitutional: Negative.   HENT: Negative.   Respiratory: Positive for cough. Negative for shortness of breath and wheezing.        Chronic cough with sputum production. No sob at rest. Dyspnea with exertion   Cardiovascular: Negative.  Physical Exam  BP 100/60 (BP Location: Left Arm, Cuff Size: Normal)   Pulse 94   Ht 6\' 4"  (1.93 m)   Wt 194 lb 12.8 oz (88.4 kg)   SpO2 94%   BMI 23.71 kg/m  Physical Exam  Constitutional: No distress.  Elderly male, chronically ill appearing   HENT:  Head: Normocephalic and atraumatic.  Eyes: Pupils are equal, round, and reactive to light. EOM are normal.  Neck: Normal range of motion. Neck supple.  Cardiovascular: Normal rate and regular rhythm.  Pulmonary/Chest: Effort normal.  LS diminished but clear. No resp distress. On home oxygen 2L.   Skin: Skin is warm and dry.  Psychiatric: He has a normal mood and affect. His behavior is normal.     Lab Results:  CBC    Component Value Date/Time   WBC 7.2 03/09/2018 1045   WBC 6.2 02/27/2018 0504   RBC 3.35 (L) 03/09/2018 1045   HGB 11.2 (L) 03/09/2018 1045   HGB 12.9 (L) 08/05/2017 1547   HCT 34.7 (L) 03/09/2018 1045   HCT 39.2 08/05/2017 1547   PLT 155 03/09/2018 1045   PLT 160 08/05/2017 1547   MCV 103.6 (H) 03/09/2018 1045   MCV 96.6 08/05/2017 1547   MCH 33.4 03/09/2018 1045   MCHC 32.3 03/09/2018 1045   RDW 16.1 (H) 03/09/2018 1045   RDW 13.2 08/05/2017 1547   LYMPHSABS 1.1 03/09/2018 1045   LYMPHSABS 2.5  08/05/2017 1547   MONOABS 0.6 03/09/2018 1045   MONOABS 0.6 08/05/2017 1547   EOSABS 0.1 03/09/2018 1045   EOSABS 0.4 08/05/2017 1547   BASOSABS 0.0 03/09/2018 1045   BASOSABS 0.0 08/05/2017 1547    BMET    Component Value Date/Time   NA 144 03/09/2018 1045   NA 138 08/05/2017 1547   K 3.4 (L) 03/09/2018 1045   K 4.0 08/05/2017 1547   CL 107 03/09/2018 1045   CO2 30 03/09/2018 1045   CO2 27 08/05/2017 1547   GLUCOSE 138 (H) 03/09/2018 1045   GLUCOSE 184 (H) 08/05/2017 1547   GLUCOSE 129 (H) 06/30/2006 0809   BUN 17 03/09/2018 1045   BUN 18.3 08/05/2017 1547   CREATININE 0.90 03/09/2018 1045   CREATININE 1.3 08/05/2017 1547   CALCIUM 9.6 03/09/2018 1045   CALCIUM 9.5 08/05/2017 1547   GFRNONAA >60 03/09/2018 1045   GFRAA >60 03/09/2018 1045    Imaging: Ct Angio Chest Pe W/cm &/or Wo Cm  Result Date: 02/24/2018 CLINICAL DATA:  Shortness of breath. History of esophageal and thyroid cancer. EXAM: CT ANGIOGRAPHY CHEST WITH CONTRAST TECHNIQUE: Multidetector CT imaging of the chest was performed using the standard protocol during bolus administration of intravenous contrast. Multiplanar CT image reconstructions and MIPs were obtained to evaluate the vascular anatomy. CONTRAST:  167mL ISOVUE-370 IOPAMIDOL (ISOVUE-370) INJECTION 76% COMPARISON:  Radiograph of same day.  CT scan of Dec 23, 2017. FINDINGS: Cardiovascular: Small filling defects seen into lower lobe branches of the left pulmonary artery concerning for small peripheral pulmonary emboli. Atherosclerosis of thoracic aorta is noted without aneurysm or dissection. Left-sided Port-A-Cath is again noted. Probable coronary artery calcifications are noted. No pericardial effusion is noted. Normal cardiac size. Mediastinum/Nodes: Status post gastric pull-through procedure for treatment of esophageal cancer. Continued presence of abnormal soft tissue density in the right thoracic inlet which is grossly unchanged in size or appearance  compared to prior exam. This surrounds the proximal right subclavian and carotid arteries. This mass also extends to the right thyroid lobe. No  definite adenopathy is noted. Lungs/Pleura: Interval development of large loculated right pleural effusion is noted. Adjacent subsegmental atelectasis is noted in the right lower lobe. 9 mm nodule is noted in right lower lobe which was present on prior exam. No pneumothorax is noted. Bulla formation is noted in both lung apices. Stable 17 x 8 mm nodular density is noted laterally in left lower lobe. Upper Abdomen: No acute abnormality. Musculoskeletal: Stable sclerotic lesion is seen in T1 vertebral body consistent with metastatic disease. No other significant osseous abnormality is noted. Review of the MIP images confirms the above findings. IMPRESSION: Small filling defects are seen in 2 lower lobe branches of the left pulmonary artery concerning for small peripheral pulmonary emboli. Status post gastric pull-through procedure for treatment of esophageal cancer. Stable abnormal soft tissue density seen in right thoracic inlet concerning for malignancy. Interval development of large loculated right pleural effusion with adjacent subsegmental atelectasis of right lower lobe. Stable bilateral pulmonary nodules are noted as described above. Stable sclerotic lesion seen in T1 vertebral body most consistent with metastatic disease. Aortic Atherosclerosis (ICD10-I70.0). Electronically Signed   By: Marijo Conception, M.D.   On: 02/24/2018 13:17   Dg Chest Port 1 View  Result Date: 02/24/2018 CLINICAL DATA:  Esophageal malignancy with most recent chemotherapy last week. Patient is febrile with shortness of breath and decreased oxygen saturation. EXAM: PORTABLE CHEST 1 VIEW COMPARISON:  CT scan of the chest of Dec 23, 2017 FINDINGS: The lungs are well-expanded. There is increased interstitial density on the right consistent with posterior layering of a moderate-sized pleural  effusion. The pulmonary interstitium is also more conspicuous on the right than on the previous study. The left lung is well-expanded and clear. The heart and pulmonary vascularity are normal. The power port catheter tip projects over the distal third of the SVC. IMPRESSION: Interval further accumulation of pleural fluid on the right which is layering posteriorly. Mildly increased interstitial markings on the right as compared to the left may reflect asymmetric pulmonary edema of cardiac or noncardiac cause. It could also reflect lymphatic obstruction. Repeat chest CT scanning is recommended. Thoracic aortic atherosclerosis. Electronically Signed   By: David  Martinique M.D.   On: 02/24/2018 12:33     Assessment & Plan:  72 year old male, former smoker quit in 2010.  Recent new right pleural effusion in July treated with IV Zosyn and p.o. Augmentin x2 weeks.  He feels that he has improved a great deal since hospitalization and is doing well.  He  still requires home oxygen at 2 L. Plan repeat chest x-ray today to evaluate pleural effusion, patient does agree to possible thoracentesis if pleural effusion has not improved or is larger.  Plan to follow-up in 6 to 8 weeks of chest x-ray is stable.   Pleural effusion on right - Chest x-ray today to evaluate previous right pleural effusion - Completed Augmentin x2 weeks - If pleural effusion is larger, patient does agree to consider thoracentesis  Esophageal cancer ([place]) - Followed by oncology, most recent chemotherapy on July 22.  Last treatment scheduled for Monday, August 5.  Patient will then have follow-up scan with follow up visit on 8/19   EMPHYSEMA - Chronic congestion, using mucinex and flutter valve  -  Taking Breo 100 daily as prescribed. Used rescue inhaler once yesterday with improvement - Continues to need home oxygen 2L (02 sat 89% RA at rest, 94% 2L with exertion)     Martyn Ehrich, NP 03/20/2018

## 2018-03-20 NOTE — Telephone Encounter (Signed)
Arrange for thoracentesis and sent for chemistry/cell count/cytology

## 2018-03-20 NOTE — Addendum Note (Signed)
Addended by: Madolyn Frieze on: 03/20/2018 01:47 PM   Modules accepted: Orders

## 2018-03-21 ENCOUNTER — Other Ambulatory Visit: Payer: Self-pay | Admitting: Oncology

## 2018-03-23 ENCOUNTER — Telehealth: Payer: Self-pay | Admitting: Oncology

## 2018-03-23 ENCOUNTER — Inpatient Hospital Stay: Payer: Medicare Other

## 2018-03-23 ENCOUNTER — Inpatient Hospital Stay: Payer: Medicare Other | Attending: Oncology

## 2018-03-23 ENCOUNTER — Inpatient Hospital Stay (HOSPITAL_BASED_OUTPATIENT_CLINIC_OR_DEPARTMENT_OTHER): Payer: Medicare Other | Admitting: Oncology

## 2018-03-23 VITALS — BP 90/50 | HR 92 | Temp 98.0°F | Resp 20 | Ht 76.0 in | Wt 194.7 lb

## 2018-03-23 DIAGNOSIS — I951 Orthostatic hypotension: Secondary | ICD-10-CM | POA: Insufficient documentation

## 2018-03-23 DIAGNOSIS — T451X5S Adverse effect of antineoplastic and immunosuppressive drugs, sequela: Secondary | ICD-10-CM | POA: Diagnosis not present

## 2018-03-23 DIAGNOSIS — E1121 Type 2 diabetes mellitus with diabetic nephropathy: Secondary | ICD-10-CM | POA: Diagnosis not present

## 2018-03-23 DIAGNOSIS — K922 Gastrointestinal hemorrhage, unspecified: Secondary | ICD-10-CM | POA: Diagnosis not present

## 2018-03-23 DIAGNOSIS — E1165 Type 2 diabetes mellitus with hyperglycemia: Secondary | ICD-10-CM | POA: Diagnosis not present

## 2018-03-23 DIAGNOSIS — R4702 Dysphasia: Secondary | ICD-10-CM | POA: Diagnosis not present

## 2018-03-23 DIAGNOSIS — Z5111 Encounter for antineoplastic chemotherapy: Secondary | ICD-10-CM | POA: Diagnosis not present

## 2018-03-23 DIAGNOSIS — J449 Chronic obstructive pulmonary disease, unspecified: Secondary | ICD-10-CM | POA: Diagnosis not present

## 2018-03-23 DIAGNOSIS — C155 Malignant neoplasm of lower third of esophagus: Secondary | ICD-10-CM | POA: Diagnosis not present

## 2018-03-23 DIAGNOSIS — Z79899 Other long term (current) drug therapy: Secondary | ICD-10-CM | POA: Insufficient documentation

## 2018-03-23 DIAGNOSIS — I2782 Chronic pulmonary embolism: Secondary | ICD-10-CM | POA: Diagnosis not present

## 2018-03-23 DIAGNOSIS — C159 Malignant neoplasm of esophagus, unspecified: Secondary | ICD-10-CM

## 2018-03-23 DIAGNOSIS — K219 Gastro-esophageal reflux disease without esophagitis: Secondary | ICD-10-CM | POA: Insufficient documentation

## 2018-03-23 DIAGNOSIS — D61810 Antineoplastic chemotherapy induced pancytopenia: Secondary | ICD-10-CM | POA: Diagnosis not present

## 2018-03-23 DIAGNOSIS — Z87891 Personal history of nicotine dependence: Secondary | ICD-10-CM | POA: Insufficient documentation

## 2018-03-23 DIAGNOSIS — R42 Dizziness and giddiness: Secondary | ICD-10-CM | POA: Diagnosis not present

## 2018-03-23 DIAGNOSIS — F329 Major depressive disorder, single episode, unspecified: Secondary | ICD-10-CM | POA: Insufficient documentation

## 2018-03-23 DIAGNOSIS — C160 Malignant neoplasm of cardia: Secondary | ICD-10-CM

## 2018-03-23 DIAGNOSIS — M1990 Unspecified osteoarthritis, unspecified site: Secondary | ICD-10-CM | POA: Diagnosis not present

## 2018-03-23 DIAGNOSIS — R110 Nausea: Secondary | ICD-10-CM

## 2018-03-23 DIAGNOSIS — J90 Pleural effusion, not elsewhere classified: Secondary | ICD-10-CM

## 2018-03-23 DIAGNOSIS — I10 Essential (primary) hypertension: Secondary | ICD-10-CM | POA: Diagnosis not present

## 2018-03-23 DIAGNOSIS — R093 Abnormal sputum: Secondary | ICD-10-CM | POA: Diagnosis not present

## 2018-03-23 DIAGNOSIS — I739 Peripheral vascular disease, unspecified: Secondary | ICD-10-CM | POA: Diagnosis not present

## 2018-03-23 DIAGNOSIS — R197 Diarrhea, unspecified: Secondary | ICD-10-CM | POA: Insufficient documentation

## 2018-03-23 DIAGNOSIS — D649 Anemia, unspecified: Secondary | ICD-10-CM | POA: Insufficient documentation

## 2018-03-23 DIAGNOSIS — Z86010 Personal history of colonic polyps: Secondary | ICD-10-CM | POA: Diagnosis not present

## 2018-03-23 DIAGNOSIS — R5383 Other fatigue: Secondary | ICD-10-CM | POA: Diagnosis not present

## 2018-03-23 DIAGNOSIS — Z95828 Presence of other vascular implants and grafts: Secondary | ICD-10-CM

## 2018-03-23 DIAGNOSIS — Z803 Family history of malignant neoplasm of breast: Secondary | ICD-10-CM | POA: Insufficient documentation

## 2018-03-23 LAB — CMP (CANCER CENTER ONLY)
ALBUMIN: 2.8 g/dL — AB (ref 3.5–5.0)
ALK PHOS: 105 U/L (ref 38–126)
ALT: 8 U/L (ref 0–44)
ANION GAP: 10 (ref 5–15)
AST: 12 U/L — ABNORMAL LOW (ref 15–41)
BUN: 18 mg/dL (ref 8–23)
CHLORIDE: 104 mmol/L (ref 98–111)
CO2: 25 mmol/L (ref 22–32)
Calcium: 8.8 mg/dL — ABNORMAL LOW (ref 8.9–10.3)
Creatinine: 1.18 mg/dL (ref 0.61–1.24)
GFR, Est AFR Am: >60 mL/min (ref >60)
GFR, Estimated: >60 mL/min (ref >60)
GLUCOSE: 206 mg/dL — AB (ref 70–99)
POTASSIUM: 4.1 mmol/L (ref 3.5–5.1)
SODIUM: 139 mmol/L (ref 135–145)
Total Bilirubin: 0.4 mg/dL (ref 0.3–1.2)
Total Protein: 6.6 g/dL (ref 6.5–8.1)

## 2018-03-23 LAB — CBC WITH DIFFERENTIAL (CANCER CENTER ONLY)
Basophils Absolute: 0.1 10*3/uL (ref 0.0–0.1)
Basophils Relative: 1 %
Eosinophils Absolute: 0.2 10*3/uL (ref 0.0–0.5)
Eosinophils Relative: 2 %
HEMATOCRIT: 27.1 % — AB (ref 38.4–49.9)
HEMOGLOBIN: 9 g/dL — AB (ref 13.0–17.1)
LYMPHS ABS: 0.7 10*3/uL — AB (ref 0.9–3.3)
LYMPHS PCT: 8 %
MCH: 33.8 pg — ABNORMAL HIGH (ref 27.2–33.4)
MCHC: 33.1 g/dL (ref 32.0–36.0)
MCV: 102.1 fL — ABNORMAL HIGH (ref 79.3–98.0)
MONOS PCT: 6 %
Monocytes Absolute: 0.5 10*3/uL (ref 0.1–0.9)
NEUTROS ABS: 7.9 10*3/uL — AB (ref 1.5–6.5)
Neutrophils Relative %: 83 %
Platelet Count: 142 10*3/uL (ref 140–400)
RBC: 2.65 MIL/uL — ABNORMAL LOW (ref 4.20–5.82)
RDW: 17.5 % — ABNORMAL HIGH (ref 11.0–14.6)
WBC Count: 9.4 10*3/uL (ref 4.0–10.3)

## 2018-03-23 LAB — CEA (IN HOUSE-CHCC): CEA (CHCC-IN HOUSE): 12.15 ng/mL — AB (ref 0.00–5.00)

## 2018-03-23 MED ORDER — OXALIPLATIN CHEMO INJECTION 100 MG/20ML
45.0000 mg/m2 | Freq: Once | INTRAVENOUS | Status: AC
  Administered 2018-03-23: 100 mg via INTRAVENOUS
  Filled 2018-03-23: qty 20

## 2018-03-23 MED ORDER — FLUOROURACIL CHEMO INJECTION 2.5 GM/50ML
400.0000 mg/m2 | Freq: Once | INTRAVENOUS | Status: AC
  Administered 2018-03-23: 900 mg via INTRAVENOUS
  Filled 2018-03-23: qty 18

## 2018-03-23 MED ORDER — DEXAMETHASONE SODIUM PHOSPHATE 10 MG/ML IJ SOLN
INTRAMUSCULAR | Status: AC
  Filled 2018-03-23: qty 1

## 2018-03-23 MED ORDER — PALONOSETRON HCL INJECTION 0.25 MG/5ML
INTRAVENOUS | Status: AC
  Filled 2018-03-23: qty 5

## 2018-03-23 MED ORDER — PALONOSETRON HCL INJECTION 0.25 MG/5ML
0.2500 mg | Freq: Once | INTRAVENOUS | Status: AC
  Administered 2018-03-23: 0.25 mg via INTRAVENOUS

## 2018-03-23 MED ORDER — SODIUM CHLORIDE 0.9 % IV SOLN
2400.0000 mg/m2 | INTRAVENOUS | Status: DC
  Administered 2018-03-23: 5400 mg via INTRAVENOUS
  Filled 2018-03-23: qty 108

## 2018-03-23 MED ORDER — DEXAMETHASONE SODIUM PHOSPHATE 10 MG/ML IJ SOLN
10.0000 mg | Freq: Once | INTRAMUSCULAR | Status: AC
  Administered 2018-03-23: 10 mg via INTRAVENOUS

## 2018-03-23 MED ORDER — DEXTROSE 5 % IV SOLN
INTRAVENOUS | Status: DC
  Administered 2018-03-23: 11:00:00 via INTRAVENOUS
  Filled 2018-03-23: qty 250

## 2018-03-23 MED ORDER — DEXTROSE 5 % IV SOLN
Freq: Once | INTRAVENOUS | Status: AC
  Administered 2018-03-23: 11:00:00 via INTRAVENOUS
  Filled 2018-03-23: qty 250

## 2018-03-23 MED ORDER — LEUCOVORIN CALCIUM INJECTION 350 MG
400.0000 mg/m2 | Freq: Once | INTRAVENOUS | Status: AC
  Administered 2018-03-23: 896 mg via INTRAVENOUS
  Filled 2018-03-23: qty 44.8

## 2018-03-23 MED ORDER — SODIUM CHLORIDE 0.9% FLUSH
10.0000 mL | INTRAVENOUS | Status: DC | PRN
Start: 2018-03-23 — End: 2018-03-23
  Administered 2018-03-23: 10 mL
  Filled 2018-03-23: qty 10

## 2018-03-23 NOTE — Progress Notes (Signed)
[facility] OFFICE PROGRESS NOTE   Diagnosis: Esophagus cancer  INTERVAL HISTORY:   Mr. Dupuis returns as scheduled.  He completed another cycle of FOLFOX on 03/09/2018.  He received Neulasta following this cycle. He reports mild nausea following chemotherapy.  Dysphasia continues to be improved.  Good appetite.  He reports persistent dyspnea.  He is scheduled for a right thoracentesis later this week.  No neuropathy symptoms.  No diarrhea.  No bleeding. Objective:  Vital signs in last 24 hours:  Blood pressure (!) 90/50, pulse 92, temperature 98 F (36.7 C), temperature source Oral, resp. rate 20, height 6\' 4"  (1.93 m), weight 194 lb 11.2 oz (88.3 kg), SpO2 95 %.    HEENT: No thrush or ulcers Resp: Distant breath sounds, diminished breath sounds at the lower chest bilaterally, no respiratory distress Cardio: Regular rate and rhythm GI: No hepatomegaly, nontender Vascular: No leg edema Neuro: Variable mild to moderate loss of vibratory sense at the fingertips bilaterally Skin: Palms without erythema  Portacath/PICC-without erythema  Lab Results:  Lab Results  Component Value Date   WBC 9.4 03/23/2018   HGB 9.0 (L) 03/23/2018   HCT 27.1 (L) 03/23/2018   MCV 102.1 (H) 03/23/2018   PLT 142 03/23/2018   NEUTROABS 7.9 (H) 03/23/2018    CMP  Lab Results  Component Value Date   NA 144 03/09/2018   K 3.4 (L) 03/09/2018   CL 107 03/09/2018   CO2 30 03/09/2018   GLUCOSE 138 (H) 03/09/2018   BUN 17 03/09/2018   CREATININE 0.90 03/09/2018   CALCIUM 9.6 03/09/2018   PROT 6.9 03/09/2018   ALBUMIN 3.1 (L) 03/09/2018   AST 11 (L) 03/09/2018   ALT 7 03/09/2018   ALKPHOS 88 03/09/2018   BILITOT 0.3 03/09/2018   GFRNONAA >60 03/09/2018   GFRAA >60 03/09/2018    Lab Results  Component Value Date   CEA1 16.05 (H) 03/09/2018     Imaging:  Dg Chest 2 View  Result Date: 03/20/2018 CLINICAL DATA:  Pleurisy, follow-up from hospitalization 3 weeks ago, cough,  shortness of breath and congestion remain, history RIGHT pleural effusion, diabetes mellitus, hypertension, former smoker, COPD EXAM: CHEST - 2 VIEW COMPARISON:  02/24/2018 FINDINGS: LEFT jugular Port-A-Cath with tip projecting over SVC. Normal heart size and pulmonary vascularity. Atherosclerotic calcifications aorta. Moderate-sized hiatal hernia. Persistent RIGHT pleural effusion and basilar atelectasis. Minimal atelectasis at LEFT base as well. Emphysematous and bronchitic changes consistent with underlying COPD. No pneumothorax or acute osseous findings. Scattered endplate spur formation thoracic spine. IMPRESSION: COPD changes with bibasilar atelectasis and persistent RIGHT pleural effusion. Moderate-sized hiatal hernia. Electronically Signed   By: Lavonia Dana M.D.   On: 03/20/2018 11:03    Medications: I have reviewed the patient's current medications.   Assessment/Plan: 1. 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 EUS 03/16/2015 confirmed a clinical stage IIb (uT3,uN1) tumor ? Staging CTs of the chest, abdomen, and pelvis with no evidence of metastatic disease ? Initiation of radiation 04/19/2015, cycle 1 Taxol/carboplatin 04/20/2015: Radiation completed 05/29/2015; the fifth and final week of Taxol/carboplatin 05/18/2015 ? Restaging CTs 07/20/2015 revealed no evidence of metastatic disease, incidental left lower lobe pulmonary embolism ? Transhiatal total esophagectomy 07/25/2015 confirmed a ypT3, ypN2 with 4/5 positive lymph nodes, lymphovascular invasion, perineural invasion, negative resection margins ? PD1 score-0 ? CT neck 07/25/2017-4-5 cm mass in the right lower neck upper mediastinum immediately adjacent to the esophageal anastomosis. 12 mm mass within the right parotid enlarged since  the previous PET study where it was about half that size. Question few small pulmonary nodules. Borderline enlarged mediastinal lymph nodes. ? CTs  brain/chest/abdomen/pelvis 08/06/2017-ill-defined mass at the right thoracic inlet extending into the right superior mediastinum; new bilateral but right much greater than left peripheral reticulonodular and groundglass opacities; scattered small paratracheal and anterior mediastinal lymph nodes; small bilateral axillary lymph nodes. Brain CT with no evidence of metastasis. ? 08/26/2017 biopsy right supraclavicular lymph node-very scant fragment of benign lymph node. ? PET scan 09/11/2017-poorly marginated heterogeneously hypermetabolic soft tissue mass at the right thoracic inlet extending laterally from the esophagogastric anastomosis in the neck. No definite hypermetabolic metastatic disease.Patchy tree-in-bud opacities throughout the lungs significantly decreased. Persistent scattered subcentimeter solid pulmonary nodules in both lungs stable to decreased. Stable irregular 1.7 cm nodular bandlike opacity anterior left lower lobe with associated low-level metabolism. Nonspecific mildly hypermetabolic subcarinal and bilateral hilar lymph nodes. Nonspecific mildly hypermetabolic nodular focus of soft tissue in the upper left retroperitoneum anterior to the adrenal gland. ? Upper endoscopy 09/15/2017-at 20 cm from the incisors tight stricture at the anastomosis measuring only a few millimeters in diameter. Surrounding tissues firm, slightly friable, noncompliant. Stricture was dilated with a 15 mm balloon several times. Diameter of stricture improved but still would not permit passage of a standard upper endoscope. Friable areas biopsied-invasive adenocarcinoma. ? Initiation of radiation and concurrent capecitabine 2/4/2019completed 10/30/2017 ? CTs neck and chest 12/23/2017-stable abnormal soft tissue at the right thoracic inlet with encasement of the proximal right common carotid artery, interval development of sclerotic lesions at C7 and T1, new indeterminate right lung opacities ? Cycle 1 FOLFOX  01/08/2018 ? Cycle 2 FOLFOX 02/02/2018 (Oxaliplatin dose reduced due to neutropenia) ? Cycle 3 FOLFOX 02/16/2018, white cell growth factor support added ? CT 02/24/2018-progressive loculated right effusion, stable lung nodules, stable thoracic inlet soft tissue, small peripheral pulmonary emboli ? Cycle 4 FOLFOX 03/09/2018  2. Postprandial subxiphoid pain secondary to #1  3. Diabetes-elevated blood sugar readings at the [facility] and at home  4. COPD  5. Depression-Improved with Wellbutrin  6. Hypertension  7. History of adenomatous colon polyps  8. History of diabetic related neuropathy, peripheral arterial disease, and nephropathy  9. History of nausea-likely related to radiation esophagitis/gastritis  10. Incidental pulmonary embolism noted on the CT 07/20/2015, treated with Xarelto  11. Admission 08/18/2015 with GI bleeding while on Xarelto, anticoagulation therapy discontinued  12. Dysphagia-improved with esophageal dilatation procedures by Dr. Henrene Pastor, progressive dysphagia secondary to local recurrence of esophagus cancer with obstruction at the esophagus anastomosis  13. Pancytopenia secondary to chemotherapy  14. CT scan abdomen/pelvis 10/09/2016 (done to evaluate persistent nausea, epigastric pain)-no acute findings or explanation for the patient's symptoms. Resolving postsurgical changes from presumed previous esophagectomy and gastric pull-through. No evidence of metastatic disease.  15. Admission 02/24/2018 with sepsis syndrome- probable pulmonary source for infection with a right effusion,cultures negative   16. Incidental pulmonary emboli noted on CT 02/24/2018, Xarelto started 03/02/2018   Disposition: [name] appears unchanged.  He has completed 4 cycles of FOLFOX.  Dysphasia has improved.  He will complete cycle 5 FOLFOX today.  We will follow-up on the CEA from today.  He will return for an office visit and chemotherapy in 2  weeks.  He has persistent dyspnea secondary to COPD and a right pleural effusion.  He is scheduled to undergo a right thoracentesis later this week.  Mr. Feutz has anemia today.  He denies bleeding.  The anemia is likely  secondary to chemotherapy and chronic disease.  We will check the hemoglobin when he returns in 2 weeks.  We will follow-up on the CEA and decide on timing of a restaging CT.  15 minutes were spent with the patient today.  The majority of the time was used for counseling and coordination of care.  Betsy Coder, MD  03/23/2018  9:01 AM

## 2018-03-23 NOTE — Telephone Encounter (Signed)
Appts already scheduled per 8/5 los - no additional appts added. -

## 2018-03-23 NOTE — Patient Instructions (Signed)
[facility] Discharge Instructions for Patients Receiving Chemotherapy  Today you received the following chemotherapy agents: Oxaliplatin, Leucovorin, and 5FU.  To help prevent nausea and vomiting after your treatment, we encourage you to take your nausea medication as directed.   If you develop nausea and vomiting that is not controlled by your nausea medication, call the clinic.   BELOW ARE SYMPTOMS THAT SHOULD BE REPORTED IMMEDIATELY:  *FEVER GREATER THAN 100.5 F  *CHILLS WITH OR WITHOUT FEVER  NAUSEA AND VOMITING THAT IS NOT CONTROLLED WITH YOUR NAUSEA MEDICATION  *UNUSUAL SHORTNESS OF BREATH  *UNUSUAL BRUISING OR BLEEDING  TENDERNESS IN MOUTH AND THROAT WITH OR WITHOUT PRESENCE OF ULCERS  *URINARY PROBLEMS  *BOWEL PROBLEMS  UNUSUAL RASH Items with * indicate a potential emergency and should be followed up as soon as possible.  Feel free to call the clinic should you have any questions or concerns. The clinic phone number is [phone].  Please show the CHEMO ALERT CARD at check-in to the Emergency Department and triage nurse.    

## 2018-03-25 ENCOUNTER — Inpatient Hospital Stay: Payer: Medicare Other

## 2018-03-25 VITALS — BP 109/61 | HR 92 | Temp 98.2°F | Resp 20

## 2018-03-25 DIAGNOSIS — Z95828 Presence of other vascular implants and grafts: Secondary | ICD-10-CM

## 2018-03-25 DIAGNOSIS — C159 Malignant neoplasm of esophagus, unspecified: Secondary | ICD-10-CM

## 2018-03-25 DIAGNOSIS — C160 Malignant neoplasm of cardia: Secondary | ICD-10-CM | POA: Diagnosis not present

## 2018-03-25 DIAGNOSIS — Z5111 Encounter for antineoplastic chemotherapy: Secondary | ICD-10-CM | POA: Diagnosis not present

## 2018-03-25 DIAGNOSIS — K922 Gastrointestinal hemorrhage, unspecified: Secondary | ICD-10-CM | POA: Diagnosis not present

## 2018-03-25 DIAGNOSIS — C155 Malignant neoplasm of lower third of esophagus: Secondary | ICD-10-CM | POA: Diagnosis not present

## 2018-03-25 DIAGNOSIS — R42 Dizziness and giddiness: Secondary | ICD-10-CM | POA: Diagnosis not present

## 2018-03-25 DIAGNOSIS — I951 Orthostatic hypotension: Secondary | ICD-10-CM | POA: Diagnosis not present

## 2018-03-25 MED ORDER — PEGFILGRASTIM-CBQV 6 MG/0.6ML ~~LOC~~ SOSY
PREFILLED_SYRINGE | SUBCUTANEOUS | Status: AC
  Filled 2018-03-25: qty 0.6

## 2018-03-25 MED ORDER — HEPARIN SOD (PORK) LOCK FLUSH 100 UNIT/ML IV SOLN
500.0000 [IU] | Freq: Once | INTRAVENOUS | Status: AC | PRN
  Administered 2018-03-25: 500 [IU]
  Filled 2018-03-25: qty 5

## 2018-03-25 MED ORDER — SODIUM CHLORIDE 0.9% FLUSH
10.0000 mL | INTRAVENOUS | Status: DC | PRN
  Administered 2018-03-25: 10 mL
  Filled 2018-03-25: qty 10

## 2018-03-25 MED ORDER — PEGFILGRASTIM-CBQV 6 MG/0.6ML ~~LOC~~ SOSY
6.0000 mg | PREFILLED_SYRINGE | Freq: Once | SUBCUTANEOUS | Status: AC
  Administered 2018-03-25: 6 mg via SUBCUTANEOUS

## 2018-03-25 NOTE — Patient Instructions (Signed)
Pegfilgrastim injection What is this medicine? PEGFILGRASTIM (PEG fil gra stim) is a long-acting granulocyte colony-stimulating factor that stimulates the growth of neutrophils, a type of white blood cell important in the body's fight against infection. It is used to reduce the incidence of fever and infection in patients with certain types of cancer who are receiving chemotherapy that affects the bone marrow, and to increase survival after being exposed to high doses of radiation. This medicine may be used for other purposes; ask your health care provider or pharmacist if you have questions. COMMON BRAND NAME(S): Neulasta What should I tell my health care provider before I take this medicine? They need to know if you have any of these conditions: -kidney disease -latex allergy -ongoing radiation therapy -sickle cell disease -skin reactions to acrylic adhesives (On-Body Injector only) -an unusual or allergic reaction to pegfilgrastim, filgrastim, other medicines, foods, dyes, or preservatives -pregnant or trying to get pregnant -breast-feeding How should I use this medicine? This medicine is for injection under the skin. If you get this medicine at home, you will be taught how to prepare and give the pre-filled syringe or how to use the On-body Injector. Refer to the patient Instructions for Use for detailed instructions. Use exactly as directed. Tell your healthcare provider immediately if you suspect that the On-body Injector may not have performed as intended or if you suspect the use of the On-body Injector resulted in a missed or partial dose. It is important that you put your used needles and syringes in a special sharps container. Do not put them in a trash can. If you do not have a sharps container, call your pharmacist or healthcare provider to get one. Talk to your pediatrician regarding the use of this medicine in children. While this drug may be prescribed for selected conditions,  precautions do apply. Overdosage: If you think you have taken too much of this medicine contact a poison control center or emergency room at once. NOTE: This medicine is only for you. Do not share this medicine with others. What if I miss a dose? It is important not to miss your dose. Call your doctor or health care professional if you miss your dose. If you miss a dose due to an On-body Injector failure or leakage, a new dose should be administered as soon as possible using a single prefilled syringe for manual use. What may interact with this medicine? Interactions have not been studied. Give your health care provider a list of all the medicines, herbs, non-prescription drugs, or dietary supplements you use. Also tell them if you smoke, drink alcohol, or use illegal drugs. Some items may interact with your medicine. This list may not describe all possible interactions. Give your health care provider a list of all the medicines, herbs, non-prescription drugs, or dietary supplements you use. Also tell them if you smoke, drink alcohol, or use illegal drugs. Some items may interact with your medicine. What should I watch for while using this medicine? You may need blood work done while you are taking this medicine. If you are going to need a MRI, CT scan, or other procedure, tell your doctor that you are using this medicine (On-Body Injector only). What side effects may I notice from receiving this medicine? Side effects that you should report to your doctor or health care professional as soon as possible: -allergic reactions like skin rash, itching or hives, swelling of the face, lips, or tongue -dizziness -fever -pain, redness, or irritation at site   where injected -pinpoint red spots on the skin -red or dark-brown urine -shortness of breath or breathing problems -stomach or side pain, or pain at the shoulder -swelling -tiredness -trouble passing urine or change in the amount of urine Side  effects that usually do not require medical attention (report to your doctor or health care professional if they continue or are bothersome): -bone pain -muscle pain This list may not describe all possible side effects. Call your doctor for medical advice about side effects. You may report side effects to FDA at 1-800-FDA-1088. Where should I keep my medicine? Keep out of the reach of children. Store pre-filled syringes in a refrigerator between 2 and 8 degrees C (36 and 46 degrees F). Do not freeze. Keep in carton to protect from light. Throw away this medicine if it is left out of the refrigerator for more than 48 hours. Throw away any unused medicine after the expiration date. NOTE: This sheet is a summary. It may not cover all possible information. If you have questions about this medicine, talk to your doctor, pharmacist, or health care provider.  2018 Elsevier/Gold Standard (2016-08-01 12:58:03)  

## 2018-03-26 ENCOUNTER — Ambulatory Visit (HOSPITAL_COMMUNITY)
Admission: RE | Admit: 2018-03-26 | Discharge: 2018-03-26 | Disposition: A | Discharge status: Home / Self Care | Payer: Medicare Other | Source: Ambulatory Visit | Attending: Primary Care | Admitting: Primary Care

## 2018-03-26 ENCOUNTER — Ambulatory Visit (HOSPITAL_COMMUNITY)
Admission: RE | Admit: 2018-03-26 | Discharge: 2018-03-26 | Disposition: A | Discharge status: Home / Self Care | Payer: Medicare Other | Source: Ambulatory Visit | Attending: Radiology | Admitting: Radiology

## 2018-03-26 ENCOUNTER — Ambulatory Visit: Payer: Medicare Other | Admitting: Occupational Therapy

## 2018-03-26 ENCOUNTER — Ambulatory Visit: Payer: Self-pay | Admitting: Physical Therapy

## 2018-03-26 DIAGNOSIS — Z9889 Other specified postprocedural states: Secondary | ICD-10-CM | POA: Diagnosis not present

## 2018-03-26 DIAGNOSIS — R846 Abnormal cytological findings in specimens from respiratory organs and thorax: Secondary | ICD-10-CM | POA: Diagnosis not present

## 2018-03-26 DIAGNOSIS — J90 Pleural effusion, not elsewhere classified: Secondary | ICD-10-CM | POA: Insufficient documentation

## 2018-03-26 DIAGNOSIS — R918 Other nonspecific abnormal finding of lung field: Secondary | ICD-10-CM | POA: Diagnosis not present

## 2018-03-26 LAB — BODY FLUID CELL COUNT WITH DIFFERENTIAL
Eos, Fluid: 2 %
LYMPHS FL: 16 %
MONOCYTE-MACROPHAGE-SEROUS FLUID: 52 % (ref 50–90)
Neutrophil Count, Fluid: 30 % — ABNORMAL HIGH (ref 0–25)
WBC FLUID: 373 uL (ref 0–1000)

## 2018-03-26 LAB — PROTEIN, PLEURAL OR PERITONEAL FLUID: TOTAL PROTEIN, FLUID: 3.7 g/dL

## 2018-03-26 LAB — LACTATE DEHYDROGENASE, PLEURAL OR PERITONEAL FLUID: LD, Fluid: 104 U/L — ABNORMAL HIGH (ref 3–23)

## 2018-03-26 MED ORDER — LIDOCAINE HCL 1 % IJ SOLN
INTRAMUSCULAR | Status: AC
  Filled 2018-03-26: qty 20

## 2018-03-26 NOTE — Procedures (Signed)
Ultrasound-guided diagnostic and therapeutic right thoracentesis performed yielding 1.7 liters of yellow fluid. No immediate complications. Follow-up chest x-ray pending. A portion of the fluid was sent to the lab for preordered studies.

## 2018-03-27 ENCOUNTER — Encounter: Payer: Self-pay | Admitting: Occupational Therapy

## 2018-03-27 ENCOUNTER — Ambulatory Visit: Payer: Self-pay | Admitting: Physical Therapy

## 2018-03-30 ENCOUNTER — Emergency Department (HOSPITAL_COMMUNITY)
Admission: EM | Admit: 2018-03-30 | Discharge: 2018-03-30 | Disposition: A | Discharge status: Home / Self Care | Payer: Medicare Other | Attending: Emergency Medicine | Admitting: Emergency Medicine

## 2018-03-30 ENCOUNTER — Ambulatory Visit: Payer: Medicare Other | Admitting: Physical Therapy

## 2018-03-30 ENCOUNTER — Other Ambulatory Visit: Payer: Self-pay

## 2018-03-30 ENCOUNTER — Encounter: Payer: Self-pay | Admitting: Physical Therapy

## 2018-03-30 ENCOUNTER — Encounter (HOSPITAL_COMMUNITY): Payer: Self-pay | Admitting: *Deleted

## 2018-03-30 ENCOUNTER — Telehealth: Payer: Self-pay

## 2018-03-30 ENCOUNTER — Telehealth: Payer: Self-pay | Admitting: Primary Care

## 2018-03-30 ENCOUNTER — Emergency Department (HOSPITAL_COMMUNITY)
Admission: EM | Admit: 2018-03-30 | Discharge: 2018-03-31 | Disposition: A | Discharge status: Home / Self Care | Payer: Medicare Other | Source: Home / Self Care | Attending: Emergency Medicine | Admitting: Emergency Medicine

## 2018-03-30 ENCOUNTER — Ambulatory Visit: Payer: Medicare Other | Admitting: Occupational Therapy

## 2018-03-30 ENCOUNTER — Encounter: Payer: Self-pay | Admitting: Occupational Therapy

## 2018-03-30 ENCOUNTER — Emergency Department (HOSPITAL_COMMUNITY): Payer: Medicare Other

## 2018-03-30 ENCOUNTER — Encounter (HOSPITAL_COMMUNITY): Payer: Self-pay | Admitting: Emergency Medicine

## 2018-03-30 VITALS — BP 66/47

## 2018-03-30 VITALS — BP 85/47

## 2018-03-30 DIAGNOSIS — J449 Chronic obstructive pulmonary disease, unspecified: Secondary | ICD-10-CM

## 2018-03-30 DIAGNOSIS — I959 Hypotension, unspecified: Secondary | ICD-10-CM

## 2018-03-30 DIAGNOSIS — Z7901 Long term (current) use of anticoagulants: Secondary | ICD-10-CM

## 2018-03-30 DIAGNOSIS — Z79899 Other long term (current) drug therapy: Secondary | ICD-10-CM

## 2018-03-30 DIAGNOSIS — E1159 Type 2 diabetes mellitus with other circulatory complications: Secondary | ICD-10-CM | POA: Insufficient documentation

## 2018-03-30 DIAGNOSIS — Z87891 Personal history of nicotine dependence: Secondary | ICD-10-CM | POA: Insufficient documentation

## 2018-03-30 DIAGNOSIS — I10 Essential (primary) hypertension: Secondary | ICD-10-CM

## 2018-03-30 DIAGNOSIS — Z794 Long term (current) use of insulin: Secondary | ICD-10-CM

## 2018-03-30 DIAGNOSIS — Z5329 Procedure and treatment not carried out because of patient's decision for other reasons: Secondary | ICD-10-CM | POA: Diagnosis not present

## 2018-03-30 DIAGNOSIS — D649 Anemia, unspecified: Secondary | ICD-10-CM

## 2018-03-30 DIAGNOSIS — J90 Pleural effusion, not elsewhere classified: Secondary | ICD-10-CM | POA: Diagnosis not present

## 2018-03-30 LAB — BLOOD GAS, VENOUS
Acid-Base Excess: 2.2 mmol/L — ABNORMAL HIGH (ref 0.0–2.0)
BICARBONATE: 28.1 mmol/L — AB (ref 20.0–28.0)
O2 Saturation: 56.6 %
PCO2 VEN: 50 mmHg (ref 44.0–60.0)
PH VEN: 7.368 (ref 7.250–7.430)
Patient temperature: 98.6
pO2, Ven: 35.8 mmHg (ref 32.0–45.0)

## 2018-03-30 LAB — COMPREHENSIVE METABOLIC PANEL
ALK PHOS: 107 U/L (ref 38–126)
ALT: 9 U/L (ref 0–44)
ANION GAP: 11 (ref 5–15)
AST: 18 U/L (ref 15–41)
Albumin: 2.9 g/dL — ABNORMAL LOW (ref 3.5–5.0)
BUN: 31 mg/dL — ABNORMAL HIGH (ref 8–23)
CALCIUM: 8.6 mg/dL — AB (ref 8.9–10.3)
CHLORIDE: 103 mmol/L (ref 98–111)
CO2: 26 mmol/L (ref 22–32)
Creatinine, Ser: 1.04 mg/dL (ref 0.61–1.24)
GFR calc non Af Amer: >60 mL/min (ref >60)
Glucose, Bld: 99 mg/dL (ref 70–99)
Potassium: 3.7 mmol/L (ref 3.5–5.1)
SODIUM: 140 mmol/L (ref 135–145)
Total Bilirubin: 0.4 mg/dL (ref 0.3–1.2)
Total Protein: 6.2 g/dL — ABNORMAL LOW (ref 6.5–8.1)

## 2018-03-30 LAB — CBC WITH DIFFERENTIAL/PLATELET
BASOS PCT: 0 %
Basophils Absolute: 0 10*3/uL (ref 0.0–0.1)
EOS ABS: 0.2 10*3/uL (ref 0.0–0.7)
Eosinophils Relative: 3 %
HCT: 20.1 % — ABNORMAL LOW (ref 39.0–52.0)
HEMOGLOBIN: 6.6 g/dL — AB (ref 13.0–17.0)
LYMPHS PCT: 14 %
Lymphs Abs: 1.1 10*3/uL (ref 0.7–4.0)
MCH: 33.8 pg (ref 26.0–34.0)
MCHC: 32.8 g/dL (ref 30.0–36.0)
MCV: 103.1 fL — ABNORMAL HIGH (ref 78.0–100.0)
Monocytes Absolute: 0.4 10*3/uL (ref 0.1–1.0)
Monocytes Relative: 5 %
NEUTROS PCT: 78 %
Neutro Abs: 6 10*3/uL (ref 1.7–7.7)
Platelets: 102 10*3/uL — ABNORMAL LOW (ref 150–400)
RBC: 1.95 MIL/uL — AB (ref 4.22–5.81)
RDW: 15.9 % — ABNORMAL HIGH (ref 11.5–15.5)
WBC: 7.7 10*3/uL (ref 4.0–10.5)

## 2018-03-30 LAB — URINALYSIS, ROUTINE W REFLEX MICROSCOPIC
Bilirubin Urine: NEGATIVE
Glucose, UA: NEGATIVE mg/dL
HGB URINE DIPSTICK: NEGATIVE
Ketones, ur: NEGATIVE mg/dL
LEUKOCYTES UA: NEGATIVE
Nitrite: NEGATIVE
PROTEIN: NEGATIVE mg/dL
SPECIFIC GRAVITY, URINE: 1.021 (ref 1.005–1.030)
pH: 5 (ref 5.0–8.0)

## 2018-03-30 LAB — PREPARE RBC (CROSSMATCH)

## 2018-03-30 MED ORDER — SODIUM CHLORIDE 0.9 % IV BOLUS
500.0000 mL | Freq: Once | INTRAVENOUS | Status: AC
  Administered 2018-03-30: 500 mL via INTRAVENOUS

## 2018-03-30 MED ORDER — SODIUM CHLORIDE 0.9 % IV SOLN
INTRAVENOUS | Status: DC
  Administered 2018-03-30: 17:00:00 via INTRAVENOUS

## 2018-03-30 MED ORDER — SODIUM CHLORIDE 0.9% IV SOLUTION
Freq: Once | INTRAVENOUS | Status: AC
  Administered 2018-03-30: via INTRAVENOUS

## 2018-03-30 NOTE — ED Triage Notes (Signed)
Patient sent from outpatient rehab due to hypotension. Note from rehab facility state BP approximately 60/40. Patient denies ever feeling weak, lightheaded, or dizzy today. Patient alert, oriented, and in no apparent distress at this time. BP 96/55 in triage.

## 2018-03-30 NOTE — ED Provider Notes (Signed)
[facility] DEPT Provider Note   CSN: [id number] Arrival date & time: 03/30/18  1342     History   Chief Complaint Chief Complaint  Patient presents with  . Hypotension    HPI Ronald Johnson is a 72 y.o. male.  HPI   Patient presents from rehab today where he was found to be hypotensive with a blood pressure of 60/40.  He is currently undergoing chemotherapy for esophageal cancer.  He had been in the emergency department earlier, and left around 11 AM today.  Apparently nursing staff called him and he returned.  Patient had thoracentesis, 03/26/2018 with cytology indicating adenocarcinoma in pleural effusion fluid.  Patient states he has chronically low blood pressure typically running 90/60.  He has been eating well.  He is taking his usual medications.  He denies fever, chills, cough, nausea, vomiting, dysuria or urinary frequency.  He reports "urinary hesitancy."  He denies rash.  He came here today by private vehicle for evaluation.  Earlier he had gone to [facility] emergency department but left prior to being seen, because he felt well enough to go home.  At this time, is interested in being evaluated for causes of low blood pressure.  There are no other known modifying factors.  Past Medical History:  Diagnosis Date  . Anemia 08/15/2009  . Anxiety   . Arthritis   . [name]'s esophagus 2007  . COLONIC POLYPS, ADENOMATOUS 08/01/2008, 2013, 2014  . COPD (chronic obstructive pulmonary disease) (HCC)    no per pt  . Depression   . Diabetes type 2, controlled ([place]) 03/13/2007  . Diverticulitis   . ED (erectile dysfunction)   . EMPHYSEMA 11/08/2008   no per pt  . Esophageal cancer ([place]) "dx'd ~ 01/2015"  . GERD 03/13/2007  . GIB (gastrointestinal bleeding) 08/18/2015  . Hiatal hernia   . History of blood transfusion 08/18/2015   due to GIB  . HYPERCHOLESTEROLEMIA 02/03/2008  . HYPERTENSION 03/13/2007   pt denies, claims white coat syndrome    . Pneumonia   . Prostatism   . PULMONARY NODULE 11/24/2008  . Stomach cancer ([place]) "dx'd ~ 01/2015"  . TOBACCO ABUSE 11/24/2008    Patient Active Problem List   Diagnosis Date Noted  . Septic shock ([place])   . Severe sepsis with septic shock ([place]) 02/26/2018  . Pleural effusion on right 02/26/2018  . Pancytopenia ([place])   . Port-A-Cath in place 01/08/2018  . Goals of care, counseling/discussion 12/25/2017  . Secondary malignant neoplasm of cervical lymph node ([place]) 09/30/2017  . Headache 06/30/2017  . Abnormal ECG 06/18/2017  . Tremor 05/05/2017  . Erectile dysfunction 10/28/2016  . Adhesive capsulitis of right shoulder associated with type 2 diabetes mellitus ([place]) 09/02/2016  . Esophageal stricture   . Dysphagia 01/23/2016  . Insulin dependent diabetes mellitus ([place]) 01/23/2016  . Controlled type 2 diabetes mellitus with diabetic peripheral angiopathy without gangrene, with long-term current use of insulin ([place]) 10/30/2015  . Fever 09/01/2015  . Diabetes mellitus due to underlying condition, uncontrolled, with circulatory complication ([place])   . Chronic diastolic heart failure ([place])   . Anemia requiring transfusions   . Hx pulmonary embolism   . Elevated troponin   . GI bleed 08/18/2015  . Symptomatic anemia 08/18/2015  . Pulmonary embolism ([place]) 08/18/2015  . HLD (hyperlipidemia) 08/18/2015  . GIB (gastrointestinal bleeding) 08/18/2015  . Gastrointestinal hemorrhage with melena   . Esophageal cancer ([place]) 07/25/2015  . LUQ abdominal pain 07/07/2015  .  GE junction carcinoma ([place]) 03/28/2015  . Chest pain 12/02/2014  . Quit smoking within past year 11/04/2013  . Screening for prostate cancer 11/04/2013  . Encounter for long-term (current) use of other medications 11/04/2013  . Routine general medical examination at a health care facility 11/08/2012  . Gastroenteritis 09/10/2011  . PROTEINURIA, MILD 08/15/2009  . ANXIETY STATE, UNSPECIFIED 12/15/2008  . PULMONARY NODULE  11/24/2008  . EMPHYSEMA 11/08/2008  . COLONIC POLYPS, ADENOMATOUS 08/01/2008  . UNSPECIFIED INFLAMMATORY AND TOXIC NEUROPATHY 05/19/2008  . CHEST PAIN, PLEURITIC 05/19/2008  . HYPERCHOLESTEROLEMIA 02/03/2008  . Depression with anxiety 03/13/2007  . Essential hypertension 03/13/2007  . GERD 03/13/2007    Past Surgical History:  Procedure Laterality Date  . BALLOON DILATION N/A 03/19/2016   Procedure: BALLOON DILATION;  Surgeon: Irene Shipper, MD;  Location: [facility];  Service: Endoscopy;  Laterality: N/A;  . CIRCUMCISION    . COLONOSCOPY    . COMPLETE ESOPHAGECTOMY N/A 07/25/2015   Procedure: TRANSHIATAL TOTAL ESOPHAGECTOMY COMPLETE PYLOROMYOTOMY;  Surgeon: [name], MD;  Location: [facility];  Service: Thoracic;  Laterality: N/A;  . EGD  11/19/2005  . ESOPHAGOGASTRODUODENOSCOPY N/A 03/19/2016   Procedure: ESOPHAGOGASTRODUODENOSCOPY (EGD);  Surgeon: Irene Shipper, MD;  Location: [facility];  Service: Endoscopy;  Laterality: N/A;  . ESOPHAGOGASTRODUODENOSCOPY (EGD) WITH PROPOFOL N/A 09/15/2017   Procedure: ESOPHAGOGASTRODUODENOSCOPY (EGD) WITH PROPOFOL;  Surgeon: Irene Shipper, MD;  Location: [facility];  Service: Endoscopy;  Laterality: N/A;  . EUS N/A 03/16/2015   Procedure: UPPER ENDOSCOPIC ULTRASOUND (EUS) RADIAL;  Surgeon: Milus Banister, MD;  Location: [facility];  Service: Endoscopy;  Laterality: N/A;  . FOOT FRACTURE SURGERY Right 1980   right foot w/ pins and screws  . IR IMAGING GUIDED PORT INSERTION  01/05/2018  . IR US GUIDE VASC ACCESS LEFT  01/05/2018  . JEJUNOSTOMY N/A 07/25/2015   Procedure: FEEDING JEJUNOSTOMY;  Surgeon: [name], MD;  Location: [facility];  Service: Thoracic;  Laterality: N/A;  . MICROLARYNGOSCOPY W/VOCAL CORD INJECTION N/A 01/22/2018   Procedure: MICROLARYNGOSCOPY WITH VOCAL CORD INJECTION;  Surgeon: Melissa Montane, MD;  Location: [facility];  Service: ENT;  Laterality: N/A;  . POLYPECTOMY    . removal pins and screws foot  1980   right foot  . UPPER  GASTROINTESTINAL ENDOSCOPY    . VASECTOMY    . VIDEO BRONCHOSCOPY N/A 07/25/2015   Procedure: VIDEO BRONCHOSCOPY;  Surgeon: [name], MD;  Location: [facility] OR;  Service: Thoracic;  Laterality: N/A;        Home Medications    Prior to Admission medications   Medication Sig Start Date End Date Taking? Authorizing Provider  albuterol (PROVENTIL HFA;VENTOLIN HFA) 108 (90 Base) MCG/ACT inhaler Inhale 2 puffs into the lungs every 6 (six) hours as needed for wheezing or shortness of breath. 02/16/18  Yes Owens Shark, NP  diazepam (VALIUM) 5 MG tablet Take 5 mg by mouth every 6 (six) hours as needed for anxiety.   Yes [provider]  escitalopram (LEXAPRO) 20 MG tablet Take 1 tablet (20 mg total) by mouth daily. 10/14/17  Yes [name], MD  fluticasone furoate-vilanterol (BREO ELLIPTA) 100-25 MCG/INH AEPB Inhale 1 puff into the lungs daily. 03/06/18  Yes Martyn Ehrich, NP  insulin NPH-regular Human (HUMULIN 70/30) (70-30) 100 UNIT/ML injection Inject 70-100 Units into the skin daily with breakfast. 03/17/18  Yes [name], MD  lidocaine-prilocaine (EMLA) cream Apply to Port-A-Cath site 1 hour prior to use 01/08/18  Yes  Owens Shark, NP  prochlorperazine (COMPAZINE) 5 MG tablet Take 1 tablet (5 mg total) by mouth every 6 (six) hours as needed for nausea or vomiting. 01/08/18  Yes Owens Shark, NP  RABEprazole (ACIPHEX) 20 MG tablet TAKE 1 TABLET(20 MG) BY MOUTH TWICE DAILY 12/24/17  Yes Irene Shipper, MD  rivaroxaban (XARELTO) 20 MG TABS tablet Take 1 tablet (20 mg total) by mouth daily with supper. 03/04/18  Yes Ladell Pier, MD  topiramate (TOPAMAX) 50 MG tablet 2 in the AM, 1 at night Patient taking differently: Take 50-100 mg by mouth See admin instructions. 2 in the AM, 1 at night 11/20/17  Yes Tat, Eustace Quail, DO  glucagon (GLUCAGON EMERGENCY) 1 MG injection Inject 1 mg into the vein once as needed for up to 1 dose. Patient taking differently: Inject 1 mg into the vein  once as needed (for low blood sugars).  11/20/17   [name], MD  glucose blood (PRECISION XTRA TEST STRIPS) test strip Check blood sugar three times a day dx 250.01 06/17/14   [name], MD  Insulin Syringe-Needle U-100 30G X 3/8" 1 ML MISC Use to inject insulin 3 times daily. 06/06/15   [name], MD  oxyCODONE (OXY IR/ROXICODONE) 5 MG immediate release tablet Take 1 tablet (5 mg total) by mouth daily as needed for severe pain. Patient not taking: Reported on 03/30/2018 11/25/17   Ladell Pier, MD  sildenafil (REVATIO) 20 MG tablet 1-5 tabs as needed for ED symptoms Patient not taking: Reported on 03/30/2018 03/03/17   [name], MD    Family History Family History  Problem Relation Age of Onset  . Stroke Mother   . Diabetes Mother   . Diabetes Maternal Grandmother        mother side of the family aunts, MGF  . Breast cancer Paternal Grandmother   . AAA (abdominal aortic aneurysm) Father   . Colon cancer Neg Hx   . Esophageal cancer Neg Hx   . Rectal cancer Neg Hx   . Stomach cancer Neg Hx     Social History Social History   Tobacco Use  . Smoking status: Former Smoker    Packs/day: 1.00    Years: 35.00    Pack years: 35.00    Types: Cigarettes    Last attempt to quit: 10/31/2008    Years since quitting: 9.4  . Smokeless tobacco: Never Used  Substance Use Topics  . Alcohol use: No    Alcohol/week: 0.0 standard drinks  . Drug use: No     Allergies   Patient has no known allergies.   Review of Systems Review of Systems  All other systems reviewed and are negative.    Physical Exam Updated Vital Signs BP 123/68   Pulse 84   Temp 97.8 F (36.6 C) (Oral)   Resp 20   Ht 6\' 4"  (1.93 m)   Wt 88.5 kg   SpO2 97%   BMI 23.74 kg/m   Physical Exam  Constitutional: He is oriented to person, place, and time. He appears well-developed. No distress.  Elderly, frail  HENT:  Head: Normocephalic and atraumatic.  Right Ear: External ear normal.  Left  Ear: External ear normal.  Eyes: Pupils are equal, round, and reactive to light. Conjunctivae and EOM are normal.  Neck: Normal range of motion and phonation normal. Neck supple.  Cardiovascular: Normal rate, regular rhythm and normal heart sounds.  Pulmonary/Chest: Effort normal and breath sounds normal. He exhibits  no bony tenderness.  Abdominal: Soft. There is no tenderness.  Musculoskeletal: Normal range of motion.  Neurological: He is alert and oriented to person, place, and time. No cranial nerve deficit or sensory deficit. He exhibits normal muscle tone. Coordination normal.  Skin: Skin is warm, dry and intact.  Psychiatric: He has a normal mood and affect. His behavior is normal. Judgment and thought content normal.  Nursing note and vitals reviewed.    ED Treatments / Results  Labs (all labs ordered are listed, but only abnormal results are displayed) Labs Reviewed  COMPREHENSIVE METABOLIC PANEL - Abnormal; Notable for the following components:      Result Value   BUN 31 (*)    Calcium 8.6 (*)    Total Protein 6.2 (*)    Albumin 2.9 (*)    All other components within normal limits  CBC WITH DIFFERENTIAL/PLATELET - Abnormal; Notable for the following components:   RBC 1.95 (*)    Hemoglobin 6.6 (*)    HCT 20.1 (*)    MCV 103.1 (*)    RDW 15.9 (*)    Platelets 102 (*)    All other components within normal limits  BLOOD GAS, VENOUS - Abnormal; Notable for the following components:   Bicarbonate 28.1 (*)    Acid-Base Excess 2.2 (*)    All other components within normal limits  URINALYSIS, ROUTINE W REFLEX MICROSCOPIC  TYPE AND SCREEN  PREPARE RBC (CROSSMATCH)    EKG None  Radiology Dg Chest 2 View  Result Date: 03/30/2018 CLINICAL DATA:  Hypotension. EXAM: CHEST - 2 VIEW COMPARISON:  03/26/2018 FINDINGS: Lungs are hyperexpanded. Interstitial markings are diffusely coarsened with chronic features. Right pleuroparenchymal opacity is similar. Tiny right pleural  effusion. The cardiopericardial silhouette is within normal limits for size. Left Port-A-Cath again noted. Bones are diffusely demineralized. IMPRESSION: Emphysema with similar appearance of right pleuroparenchymal opacity and tiny right effusion . Electronically Signed   By: Misty Stanley M.D.   On: 03/30/2018 18:11    Procedures .Critical Care Performed by: Daleen Bo, MD Authorized by: Daleen Bo, MD   Critical care provider statement:    Critical care time (minutes):  40   Critical care start time:  03/30/2018 3:50 PM   Critical care end time:  03/31/2018 12:35 AM   Critical care time was exclusive of:  Separately billable procedures and treating other patients   Critical care was necessary to treat or prevent imminent or life-threatening deterioration of the following conditions:  Circulatory failure   Critical care was time spent personally by me on the following activities:  Blood draw for specimens, development of treatment plan with patient or surrogate, discussions with consultants, evaluation of patient's response to treatment, examination of patient, obtaining history from patient or surrogate, ordering and performing treatments and interventions, ordering and review of laboratory studies, pulse oximetry, re-evaluation of patient's condition, review of old charts and ordering and review of radiographic studies   (including critical care time)  Medications Ordered in ED Medications  0.9 %  sodium chloride infusion ( Intravenous Stopped 03/30/18 2159)  sodium chloride 0.9 % bolus 500 mL (0 mLs Intravenous Stopped 03/30/18 1800)  0.9 %  sodium chloride infusion (Manually program via Guardrails IV Fluids) ( Intravenous Hold 03/31/18 0013)     Initial Impression / Assessment and Plan / ED Course  I have reviewed the triage vital signs and the nursing notes.  Pertinent labs & imaging results that were available during my care of the patient  were reviewed by me and considered in  my medical decision making (see chart for details).      Patient Vitals for the past 24 hrs:  BP Temp Temp src Pulse Resp SpO2 Height Weight  03/31/18 0020 123/68 97.8 F (36.6 C) Oral 84 20 97 % - -  03/30/18 2352 115/70 98.2 F (36.8 C) Oral 82 18 98 % - -  03/30/18 2230 96/63 98.5 F (36.9 C) Oral 76 (!) 24 97 % - -  03/30/18 2200 (!) 99/59 - - 80 (!) 21 97 % - -  03/30/18 2100 105/60 98.2 F (36.8 C) Oral 80 (!) 23 97 % - -  03/30/18 2030 109/63 - - 79 (!) 24 98 % - -  03/30/18 2010 (!) 98/57 97.7 F (36.5 C) Oral 82 18 100 % - -  03/30/18 1945 94/62 97.6 F (36.4 C) Oral (!) 102 18 100 % - -  03/30/18 1818 94/61 97.7 F (36.5 C) Oral 78 18 100 % - -  03/30/18 1645 (!) 89/50 - - 91 (!) 21 94 % - -  03/30/18 1414 (!) 99/55 - - - - - - -  03/30/18 1356 (!) 96/52 97.6 F (36.4 C) Oral 89 14 100 % 6\' 4"  (1.93 m) 88.5 kg    12:37 AM Reevaluation with update and discussion. After initial assessment and treatment, an updated evaluation reveals patient comfortable, getting red cell infusion at this time.  Updated on plan. Daleen Bo   Medical Decision Making: Patient with anemia associated with recent therapy and known esophageal tumor.  Patient fairly asymptomatic despite having low blood pressure.  He has low blood pressure at baseline, according to him is typically 90/60.  Patient nontoxic in the ED.  Blood transfusions ordered for 2 units, at 6 PM, at 2 hours/unit.  Patient can be discharged following PRBC infusions, with outpatient follow-up.  Would hold on rehab for 2 or 3 days, prior to returning.  CRITICAL CARE-yes Performed by: Daleen Bo   Nursing Notes Reviewed/ Care Coordinated Applicable Imaging Reviewed Interpretation of Laboratory Data incorporated into ED treatment   Plan-discharge after second unit transfused.  Outpatient follow-up with oncology.   Final Clinical Impressions(s) / ED Diagnoses   Final diagnoses:  Anemia, unspecified type    Hypotension, unspecified hypotension type    ED Discharge Orders    None       Daleen Bo, MD 03/31/18 [phone]

## 2018-03-30 NOTE — ED Notes (Signed)
Bed: WA04 Expected date:  Expected time:  Means of arrival:  Comments: Hold for triage 2

## 2018-03-30 NOTE — ED Triage Notes (Addendum)
Pt was at the [facility] rehab center and BP was found to be 60/40.  Pt went to [facility] and pt's BP was back to normal.  Pt attempted to get in with his PCP and was told he couldn't be seen today.  Pt was advised to go the the ED.  Pt a/o x 4 and on 2L [place].  Pt is a esophageal CA and last chemo was last Monday.  Pt reports he is dizzy.

## 2018-03-30 NOTE — ED Notes (Signed)
Called pt name in lobby x3 for vitals. No response.

## 2018-03-30 NOTE — Telephone Encounter (Signed)
Dr. Elsworth Soho,  I saw your patient on 7/19 and 8/2 for hospital follow-up visit regarding right pleural effusion, treated with Augmentin x 2 weeks. Patient had initially elected not to have any invasive procedures. He follows with oncology for esophageal cancer. We had spoken at that time about this patient and you recommended ultrasound guided diagnostic and therapeutic thoracentesis. Patient agreed and underwent right thoracentesis on 03/26/18 with 1.7 L removed.   Protein 3.7, LDH 104. Cytology returned today showing pleural fluid suspicious for adenocarcinoma.   Do you have any recommendations. Next apt with you on 9/30   Thanks, Geraldo Pitter, NP

## 2018-03-30 NOTE — ED Notes (Signed)
Spoke with Dr. Eulis Foster, he reports his Normal Saline Fluids can be discontinued since his blood is infusing.

## 2018-03-30 NOTE — Telephone Encounter (Signed)
Ronald Johnson called from patient's rehab & stated that patient wasn't feeling well because his BP was so low. Sitting it was 85/47 & lying 114/62. The when he was sat up BP once again dropped to 89/55. I asked Dr. Loanne Drilling for advise & because patient isn't on BP meds he asked that patient to the ER. I also talked to patient's wife & advised her to take her husband to the ER.

## 2018-03-30 NOTE — Therapy (Signed)
[facility] [address] [place], [place], [address] Phone: [phone]   Fax:  [phone]  Patient Details  Name: Ronald Johnson MRN: [id number] Date of Birth: [birth year] Referring Provider:  [name], MD  Encounter Date: 03/30/2018 Pt treatment was witheld due to low BP. Pt was placed in supine and pt's BP improved. Therapist called and spoke with Dr. Cordelia Pen nurse. She recommended pt go to ED due to concerns over bleeding esophagus. Pt left via private car with his wife. Pt's wife was aware that pt should be transported into ED via w/c.  RINE,KATHRYN 03/30/2018, 9:48 AM  [facility] [address] [place], [place], [address] Phone: [phone]   Fax:  [phone]

## 2018-03-30 NOTE — ED Notes (Signed)
Assisted patient to the restroom and back to stretcher. Pt tolerated it fair.

## 2018-03-30 NOTE — Therapy (Signed)
[facility] [address] [place], [place], [address] Phone: [phone]   Fax:  [phone]  Patient Details  Name: Ronald Johnson MRN: [id number] Date of Birth: 11-Jan-1946 Referring Provider:  [name], MD  Encounter Date: 03/30/2018   ARRIVED - NO CHARGE - PT   Pt arrived for OT and PT appts, ambulating with RW.  Pt had OT appt prior to scheduled PT appt.  Curt Bears Rine, OTR/L, stated that pt reported he was not feeling well; she took BP and BP was very low; pt was lying on mat in treatment room with legs elevated on a wedge.  Wife declined transport by EMS, stating she would take him by car to [facility] ED.   Pt transferred supine to sitting with supervision - denied lightheadedness upon sitting.  BP recorded in sitting, after transfer into wheelchair (pt transferred by stand pivot transfer); BP 66/47.  Pt was transported to car via wheelchair - CGA for sit to stand into passenger's seat of car.   Alda Lea, PT 03/30/2018, 9:56 AM  [facility] [address] [place], [place], [address] Phone: [phone]   Fax:  [phone]

## 2018-03-30 NOTE — ED Notes (Signed)
Unable to locate patient x3, called patient who was at home with family member, states he started felling better and left, encouraged to return if needed

## 2018-03-31 ENCOUNTER — Telehealth: Payer: Self-pay | Admitting: Primary Care

## 2018-03-31 ENCOUNTER — Encounter: Payer: Self-pay | Admitting: Primary Care

## 2018-03-31 DIAGNOSIS — I959 Hypotension, unspecified: Secondary | ICD-10-CM | POA: Diagnosis not present

## 2018-03-31 MED ORDER — HEPARIN SOD (PORK) LOCK FLUSH 100 UNIT/ML IV SOLN
500.0000 [IU] | Freq: Once | INTRAVENOUS | Status: AC
  Administered 2018-03-31: 500 [IU]
  Filled 2018-03-31: qty 5

## 2018-03-31 NOTE — Telephone Encounter (Signed)
Unfortunately this means that he has a malignant effusion and esophageal cancer is advanced. We will defer to oncology, Dr. Malachy Mood to see if his treatment plan needs to be changed in any way. From the point of malignant effusion, he will need follow-up chest x-ray to see if the effusion re-accumulates, can do this on routine follow-up in September

## 2018-03-31 NOTE — Telephone Encounter (Signed)
Left message to review thoracentesis cytology results. Will need to speak with provider when calls back. Patient has an apt with oncology on Monday 8/19. Thanks

## 2018-03-31 NOTE — Telephone Encounter (Signed)
Thanks, would only consider Pleurx if fluid re-accumulates or if no other treatment planned

## 2018-03-31 NOTE — Telephone Encounter (Signed)
Ronald Johnson, we are seeing him 04/06/2018, will discuss a Pleurx with him Thanks

## 2018-03-31 NOTE — ED Notes (Signed)
Provided patient a cup of Diet Coke.

## 2018-03-31 NOTE — ED Provider Notes (Signed)
Assumed care from Dr. Eulis Foster.  Patient presented with hypotension and anemia.  Likely secondary to chemotherapy.  Oncology was consulted and they recommended blood transfusions in the emergency department.  Patient has received 1 unit of blood.  Currently awaiting the second unit.  Plan is to transfuse if patient is stable, he will be allowed to be discharged.  Transfusions completed.  Patient's BPs in the 120s.  Stable for discharge.  Disposition: Discharge  Condition: Good  I have discussed the results, Dx and Tx plan with the patient who expressed understanding and agree(s) with the plan. Discharge instructions discussed at great length. The patient was given strict return precautions who verbalized understanding of the instructions. No further questions at time of discharge.    ED Discharge Orders    None       Follow Up: Oncology   As scheduled      Fatima Blank, MD 03/31/18 [phone]

## 2018-04-01 ENCOUNTER — Encounter (HOSPITAL_COMMUNITY): Payer: Self-pay

## 2018-04-01 ENCOUNTER — Observation Stay (HOSPITAL_COMMUNITY)
Admission: EM | Admit: 2018-04-01 | Discharge: 2018-04-02 | Disposition: A | Discharge status: Home / Self Care | Payer: Medicare Other | Attending: Internal Medicine | Admitting: Internal Medicine

## 2018-04-01 ENCOUNTER — Other Ambulatory Visit: Payer: Self-pay

## 2018-04-01 ENCOUNTER — Encounter: Payer: Self-pay | Admitting: Occupational Therapy

## 2018-04-01 ENCOUNTER — Telehealth: Payer: Self-pay | Admitting: *Deleted

## 2018-04-01 ENCOUNTER — Emergency Department (HOSPITAL_COMMUNITY): Payer: Medicare Other

## 2018-04-01 DIAGNOSIS — Z9981 Dependence on supplemental oxygen: Secondary | ICD-10-CM | POA: Insufficient documentation

## 2018-04-01 DIAGNOSIS — Z8501 Personal history of malignant neoplasm of esophagus: Secondary | ICD-10-CM | POA: Insufficient documentation

## 2018-04-01 DIAGNOSIS — Z86711 Personal history of pulmonary embolism: Secondary | ICD-10-CM | POA: Diagnosis present

## 2018-04-01 DIAGNOSIS — J690 Pneumonitis due to inhalation of food and vomit: Secondary | ICD-10-CM | POA: Diagnosis not present

## 2018-04-01 DIAGNOSIS — E7800 Pure hypercholesterolemia, unspecified: Secondary | ICD-10-CM | POA: Insufficient documentation

## 2018-04-01 DIAGNOSIS — Z85028 Personal history of other malignant neoplasm of stomach: Secondary | ICD-10-CM | POA: Diagnosis not present

## 2018-04-01 DIAGNOSIS — I5032 Chronic diastolic (congestive) heart failure: Secondary | ICD-10-CM | POA: Insufficient documentation

## 2018-04-01 DIAGNOSIS — Z87891 Personal history of nicotine dependence: Secondary | ICD-10-CM | POA: Diagnosis not present

## 2018-04-01 DIAGNOSIS — R0602 Shortness of breath: Secondary | ICD-10-CM

## 2018-04-01 DIAGNOSIS — I10 Essential (primary) hypertension: Secondary | ICD-10-CM | POA: Diagnosis not present

## 2018-04-01 DIAGNOSIS — N400 Enlarged prostate without lower urinary tract symptoms: Secondary | ICD-10-CM | POA: Insufficient documentation

## 2018-04-01 DIAGNOSIS — R0902 Hypoxemia: Secondary | ICD-10-CM | POA: Diagnosis not present

## 2018-04-01 DIAGNOSIS — Z7901 Long term (current) use of anticoagulants: Secondary | ICD-10-CM | POA: Diagnosis not present

## 2018-04-01 DIAGNOSIS — C159 Malignant neoplasm of esophagus, unspecified: Secondary | ICD-10-CM | POA: Diagnosis present

## 2018-04-01 DIAGNOSIS — F418 Other specified anxiety disorders: Secondary | ICD-10-CM | POA: Insufficient documentation

## 2018-04-01 DIAGNOSIS — K219 Gastro-esophageal reflux disease without esophagitis: Secondary | ICD-10-CM | POA: Diagnosis not present

## 2018-04-01 DIAGNOSIS — J910 Malignant pleural effusion: Secondary | ICD-10-CM | POA: Insufficient documentation

## 2018-04-01 DIAGNOSIS — E11649 Type 2 diabetes mellitus with hypoglycemia without coma: Secondary | ICD-10-CM | POA: Diagnosis present

## 2018-04-01 DIAGNOSIS — Z79899 Other long term (current) drug therapy: Secondary | ICD-10-CM | POA: Insufficient documentation

## 2018-04-01 DIAGNOSIS — J449 Chronic obstructive pulmonary disease, unspecified: Secondary | ICD-10-CM | POA: Insufficient documentation

## 2018-04-01 DIAGNOSIS — D539 Nutritional anemia, unspecified: Secondary | ICD-10-CM | POA: Diagnosis not present

## 2018-04-01 DIAGNOSIS — F411 Generalized anxiety disorder: Secondary | ICD-10-CM | POA: Diagnosis present

## 2018-04-01 DIAGNOSIS — Z794 Long term (current) use of insulin: Secondary | ICD-10-CM | POA: Insufficient documentation

## 2018-04-01 DIAGNOSIS — E162 Hypoglycemia, unspecified: Secondary | ICD-10-CM | POA: Diagnosis not present

## 2018-04-01 DIAGNOSIS — I110 Hypertensive heart disease with heart failure: Secondary | ICD-10-CM | POA: Diagnosis not present

## 2018-04-01 DIAGNOSIS — E1165 Type 2 diabetes mellitus with hyperglycemia: Secondary | ICD-10-CM | POA: Diagnosis not present

## 2018-04-01 DIAGNOSIS — E161 Other hypoglycemia: Secondary | ICD-10-CM | POA: Diagnosis not present

## 2018-04-01 LAB — BASIC METABOLIC PANEL
Anion gap: 11 (ref 5–15)
BUN: 18 mg/dL (ref 8–23)
CHLORIDE: 107 mmol/L (ref 98–111)
CO2: 24 mmol/L (ref 22–32)
CREATININE: 0.93 mg/dL (ref 0.61–1.24)
Calcium: 8.8 mg/dL — ABNORMAL LOW (ref 8.9–10.3)
GFR calc Af Amer: >60 mL/min (ref >60)
GFR calc non Af Amer: >60 mL/min (ref >60)
GLUCOSE: 147 mg/dL — AB (ref 70–99)
POTASSIUM: 4 mmol/L (ref 3.5–5.1)
SODIUM: 142 mmol/L (ref 135–145)

## 2018-04-01 LAB — CBC WITH DIFFERENTIAL/PLATELET
BASOS PCT: 0 %
Basophils Absolute: 0 10*3/uL (ref 0.0–0.1)
EOS ABS: 0.1 10*3/uL (ref 0.0–0.7)
Eosinophils Relative: 2 %
HCT: 28.5 % — ABNORMAL LOW (ref 39.0–52.0)
HEMOGLOBIN: 9.2 g/dL — AB (ref 13.0–17.0)
LYMPHS ABS: 1.2 10*3/uL (ref 0.7–4.0)
Lymphocytes Relative: 18 %
MCH: 32.5 pg (ref 26.0–34.0)
MCHC: 32.3 g/dL (ref 30.0–36.0)
MCV: 100.7 fL — AB (ref 78.0–100.0)
MONO ABS: 0.6 10*3/uL (ref 0.1–1.0)
MONOS PCT: 8 %
NEUTROS ABS: 4.7 10*3/uL (ref 1.7–7.7)
Neutrophils Relative %: 72 %
Platelets: 89 10*3/uL — ABNORMAL LOW (ref 150–400)
RBC: 2.83 MIL/uL — ABNORMAL LOW (ref 4.22–5.81)
RDW: 16.8 % — ABNORMAL HIGH (ref 11.5–15.5)
WBC: 6.6 10*3/uL (ref 4.0–10.5)

## 2018-04-01 LAB — CBG MONITORING, ED
GLUCOSE-CAPILLARY: 74 mg/dL (ref 70–99)
GLUCOSE-CAPILLARY: 71 mg/dL (ref 70–99)
GLUCOSE-CAPILLARY: 75 mg/dL (ref 70–99)
Glucose-Capillary: 97 mg/dL (ref 70–99)

## 2018-04-01 MED ORDER — SODIUM CHLORIDE 0.9 % IV SOLN
3.0000 g | Freq: Once | INTRAVENOUS | Status: AC
  Administered 2018-04-01: 3 g via INTRAVENOUS
  Filled 2018-04-01: qty 3

## 2018-04-01 MED ORDER — ENSURE ENLIVE PO LIQD
237.0000 mL | Freq: Two times a day (BID) | ORAL | Status: DC
  Administered 2018-04-01 – 2018-04-02 (×3): 237 mL via ORAL
  Filled 2018-04-01 (×2): qty 237

## 2018-04-01 NOTE — ED Provider Notes (Signed)
[facility] DEPT Provider Note   CSN: [id number] Arrival date & time: 04/01/18  1812     History   Chief Complaint Chief Complaint  Patient presents with  . Hypoglycemia    HPI Ronald Johnson is a 72 y.o. male.  72 year old male presents with low blood sugar by EMS.  EMS had been initially called because the patient had a esophageal obstruction.  Does have a history of esophageal cancer.  That resolved spontaneously.  Blood sugar was noted to be 45 and he was given glucose and transported here.  Patient states he feels back to his baseline.     Past Medical History:  Diagnosis Date  . Anemia 08/15/2009  . Anxiety   . Arthritis   . [name]'s esophagus 2007  . COLONIC POLYPS, ADENOMATOUS 08/01/2008, 2013, 2014  . COPD (chronic obstructive pulmonary disease) (HCC)    no per pt  . Depression   . Diabetes type 2, controlled ([place]) 03/13/2007  . Diverticulitis   . ED (erectile dysfunction)   . EMPHYSEMA 11/08/2008   no per pt  . Esophageal cancer ([place]) "dx'd ~ 01/2015"  . GERD 03/13/2007  . GIB (gastrointestinal bleeding) 08/18/2015  . Hiatal hernia   . History of blood transfusion 08/18/2015   due to GIB  . HYPERCHOLESTEROLEMIA 02/03/2008  . HYPERTENSION 03/13/2007   pt denies, claims white coat syndrome  . Pneumonia   . Prostatism   . PULMONARY NODULE 11/24/2008  . Stomach cancer ([place]) "dx'd ~ 01/2015"  . TOBACCO ABUSE 11/24/2008    Patient Active Problem List   Diagnosis Date Noted  . Septic shock ([place])   . Severe sepsis with septic shock ([place]) 02/26/2018  . Pleural effusion, malignant 02/26/2018  . Pancytopenia ([place])   . Port-A-Cath in place 01/08/2018  . Goals of care, counseling/discussion 12/25/2017  . Secondary malignant neoplasm of cervical lymph node ([place]) 09/30/2017  . Headache 06/30/2017  . Abnormal ECG 06/18/2017  . Tremor 05/05/2017  . Erectile dysfunction 10/28/2016  . Adhesive capsulitis of right shoulder associated with  type 2 diabetes mellitus ([place]) 09/02/2016  . Esophageal stricture   . Dysphagia 01/23/2016  . Insulin dependent diabetes mellitus ([place]) 01/23/2016  . Controlled type 2 diabetes mellitus with diabetic peripheral angiopathy without gangrene, with long-term current use of insulin ([place]) 10/30/2015  . Fever 09/01/2015  . Diabetes mellitus due to underlying condition, uncontrolled, with circulatory complication ([place])   . Chronic diastolic heart failure ([place])   . Anemia requiring transfusions   . Hx pulmonary embolism   . Elevated troponin   . GI bleed 08/18/2015  . Symptomatic anemia 08/18/2015  . Pulmonary embolism ([place]) 08/18/2015  . HLD (hyperlipidemia) 08/18/2015  . GIB (gastrointestinal bleeding) 08/18/2015  . Gastrointestinal hemorrhage with melena   . Esophageal cancer ([place]) 07/25/2015  . LUQ abdominal pain 07/07/2015  . 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 Chest pain 12/02/2014  . Quit smoking within past year 11/04/2013  . Screening for prostate cancer 11/04/2013  . Encounter for long-term (current) use of other medications 11/04/2013  . Routine general medical examination at a health care facility 11/08/2012  . Gastroenteritis 09/10/2011  . PROTEINURIA, MILD 08/15/2009  . ANXIETY STATE, UNSPECIFIED 12/15/2008  . PULMONARY NODULE 11/24/2008  . EMPHYSEMA 11/08/2008  . COLONIC POLYPS, ADENOMATOUS 08/01/2008  . UNSPECIFIED INFLAMMATORY AND TOXIC NEUROPATHY 05/19/2008  . CHEST PAIN, PLEURITIC 05/19/2008  . HYPERCHOLESTEROLEMIA 02/03/2008  . Depression with anxiety 03/13/2007  . Essential hypertension 03/13/2007  . GERD  03/13/2007    Past Surgical History:  Procedure Laterality Date  . BALLOON DILATION N/A 03/19/2016   Procedure: BALLOON DILATION;  Surgeon: Irene Shipper, MD;  Location: [facility];  Service: Endoscopy;  Laterality: N/A;  . CIRCUMCISION    . COLONOSCOPY    . COMPLETE ESOPHAGECTOMY N/A 07/25/2015   Procedure: TRANSHIATAL TOTAL ESOPHAGECTOMY COMPLETE  PYLOROMYOTOMY;  Surgeon: [name], MD;  Location: [facility];  Service: Thoracic;  Laterality: N/A;  . EGD  11/19/2005  . ESOPHAGOGASTRODUODENOSCOPY N/A 03/19/2016   Procedure: ESOPHAGOGASTRODUODENOSCOPY (EGD);  Surgeon: Irene Shipper, MD;  Location: [facility];  Service: Endoscopy;  Laterality: N/A;  . ESOPHAGOGASTRODUODENOSCOPY (EGD) WITH PROPOFOL N/A 09/15/2017   Procedure: ESOPHAGOGASTRODUODENOSCOPY (EGD) WITH PROPOFOL;  Surgeon: Irene Shipper, MD;  Location: [facility];  Service: Endoscopy;  Laterality: N/A;  . EUS N/A 03/16/2015   Procedure: UPPER ENDOSCOPIC ULTRASOUND (EUS) RADIAL;  Surgeon: Milus Banister, MD;  Location: [facility];  Service: Endoscopy;  Laterality: N/A;  . FOOT FRACTURE SURGERY Right 1980   right foot w/ pins and screws  . IR IMAGING GUIDED PORT INSERTION  01/05/2018  . IR US GUIDE VASC ACCESS LEFT  01/05/2018  . JEJUNOSTOMY N/A 07/25/2015   Procedure: FEEDING JEJUNOSTOMY;  Surgeon: [name], MD;  Location: [facility];  Service: Thoracic;  Laterality: N/A;  . MICROLARYNGOSCOPY W/VOCAL CORD INJECTION N/A 01/22/2018   Procedure: MICROLARYNGOSCOPY WITH VOCAL CORD INJECTION;  Surgeon: Melissa Montane, MD;  Location: [facility];  Service: ENT;  Laterality: N/A;  . POLYPECTOMY    . removal pins and screws foot  1980   right foot  . UPPER GASTROINTESTINAL ENDOSCOPY    . VASECTOMY    . VIDEO BRONCHOSCOPY N/A 07/25/2015   Procedure: VIDEO BRONCHOSCOPY;  Surgeon: [name], MD;  Location: [facility] OR;  Service: Thoracic;  Laterality: N/A;        Home Medications    Prior to Admission medications   Medication Sig Start Date End Date Taking? Authorizing Provider  albuterol (PROVENTIL HFA;VENTOLIN HFA) 108 (90 Base) MCG/ACT inhaler Inhale 2 puffs into the lungs every 6 (six) hours as needed for wheezing or shortness of breath. 02/16/18   Owens Shark, NP  diazepam (VALIUM) 5 MG tablet Take 5 mg by mouth every 6 (six) hours as needed for anxiety.    [provider]    escitalopram (LEXAPRO) 20 MG tablet Take 1 tablet (20 mg total) by mouth daily. 10/14/17   [name], MD  fluticasone furoate-vilanterol (BREO ELLIPTA) 100-25 MCG/INH AEPB Inhale 1 puff into the lungs daily. 03/06/18   Martyn Ehrich, NP  glucagon (GLUCAGON EMERGENCY) 1 MG injection Inject 1 mg into the vein once as needed for up to 1 dose. Patient taking differently: Inject 1 mg into the vein once as needed (for low blood sugars).  11/20/17   [name], MD  glucose blood (PRECISION XTRA TEST STRIPS) test strip Check blood sugar three times a day dx 250.01 06/17/14   [name], MD  insulin NPH-regular Human (HUMULIN 70/30) (70-30) 100 UNIT/ML injection Inject 70-100 Units into the skin daily with breakfast. 03/17/18   [name], MD  Insulin Syringe-Needle U-100 30G X 3/8" 1 ML MISC Use to inject insulin 3 times daily. 06/06/15   [name], MD  lidocaine-prilocaine (EMLA) cream Apply to Port-A-Cath site 1 hour prior to use 01/08/18   Owens Shark, NP  oxyCODONE (OXY IR/ROXICODONE) 5 MG immediate release tablet Take 1 tablet (5 mg  total) by mouth daily as needed for severe pain. Patient not taking: Reported on 03/30/2018 11/25/17   Ladell Pier, MD  prochlorperazine (COMPAZINE) 5 MG tablet Take 1 tablet (5 mg total) by mouth every 6 (six) hours as needed for nausea or vomiting. 01/08/18   Owens Shark, NP  RABEprazole (ACIPHEX) 20 MG tablet TAKE 1 TABLET(20 MG) BY MOUTH TWICE DAILY 12/24/17   Irene Shipper, MD  rivaroxaban (XARELTO) 20 MG TABS tablet Take 1 tablet (20 mg total) by mouth daily with supper. 03/04/18   Ladell Pier, MD  sildenafil (REVATIO) 20 MG tablet 1-5 tabs as needed for ED symptoms Patient not taking: Reported on 03/30/2018 03/03/17   [name], MD  topiramate (TOPAMAX) 50 MG tablet 2 in the AM, 1 at night Patient taking differently: Take 50-100 mg by mouth See admin instructions. 2 in the AM, 1 at night 11/20/17   Tat, Eustace Quail, DO    Family  History Family History  Problem Relation Age of Onset  . Stroke Mother   . Diabetes Mother   . Diabetes Maternal Grandmother        mother side of the family aunts, MGF  . Breast cancer Paternal Grandmother   . AAA (abdominal aortic aneurysm) Father   . Colon cancer Neg Hx   . Esophageal cancer Neg Hx   . Rectal cancer Neg Hx   . Stomach cancer Neg Hx     Social History Social History   Tobacco Use  . Smoking status: Former Smoker    Packs/day: 1.00    Years: 35.00    Pack years: 35.00    Types: Cigarettes    Last attempt to quit: 10/31/2008    Years since quitting: 9.4  . Smokeless tobacco: Never Used  Substance Use Topics  . Alcohol use: No    Alcohol/week: 0.0 standard drinks  . Drug use: No     Allergies   Patient has no known allergies.   Review of Systems Review of Systems  All other systems reviewed and are negative.    Physical Exam Updated Vital Signs Ht 1.93 m (6\' 4" )   Wt 88.5 kg   SpO2 100%   BMI 23.74 kg/m   Physical Exam  Constitutional: He is oriented to person, place, and time. He appears well-developed and well-nourished.  Non-toxic appearance. No distress.  HENT:  Head: Normocephalic and atraumatic.  Eyes: Pupils are equal, round, and reactive to light. Conjunctivae, EOM and lids are normal.  Neck: Normal range of motion. Neck supple. No tracheal deviation present. No thyroid mass present.  Cardiovascular: Normal rate, regular rhythm and normal heart sounds. Exam reveals no gallop.  No murmur heard. Pulmonary/Chest: Effort normal and breath sounds normal. No stridor. No respiratory distress. He has no decreased breath sounds. He has no wheezes. He has no rhonchi. He has no rales.  Abdominal: Soft. Normal appearance and bowel sounds are normal. He exhibits no distension. There is no tenderness. There is no rebound and no CVA tenderness.  Musculoskeletal: Normal range of motion. He exhibits no edema or tenderness.  Neurological: He is  alert and oriented to person, place, and time. He has normal strength. No cranial nerve deficit or sensory deficit. GCS eye subscore is 4. GCS verbal subscore is 5. GCS motor subscore is 6.  Skin: Skin is warm and dry. No abrasion and no rash noted.  Psychiatric: He has a normal mood and affect. His speech is normal and behavior is  normal.  Nursing note and vitals reviewed.    ED Treatments / Results  Labs (all labs ordered are listed, but only abnormal results are displayed) Labs Reviewed - No data to display  EKG None  Radiology No results found.  Procedures Procedures (including critical care time)  Medications Ordered in ED Medications  feeding supplement (ENSURE ENLIVE) (ENSURE ENLIVE) liquid 237 mL (has no administration in time range)     Initial Impression / Assessment and Plan / ED Course  I have reviewed the triage vital signs and the nursing notes.  Pertinent labs & imaging results that were available during my care of the patient were reviewed by me and considered in my medical decision making (see chart for details).     Patient presented with possible esophageal obstruction which is resolved prior to arrival.  States that he may have aspirated some gastric contents.  Was noted to be hypoglycemic which was treated with food here but his blood sugars still remains below 100.  Patient was noted to be tachycardic and given IV fluids and chest x-ray consistent with pneumonia.  Possibly from aspiration will place on Unasyn.  Will admit to the hospitalist service  CRITICAL CARE Performed by: Leota Jacobsen Total critical care time: 60 minutes Critical care time was exclusive of separately billable procedures and treating other patients. Critical care was necessary to treat or prevent imminent or life-threatening deterioration. Critical care was time spent personally by me on the following activities: development of treatment plan with patient and/or surrogate as well  as nursing, discussions with consultants, evaluation of patient's response to treatment, examination of patient, obtaining history from patient or surrogate, ordering and performing treatments and interventions, ordering and review of laboratory studies, ordering and review of radiographic studies, pulse oximetry and re-evaluation of patient's condition.   Final Clinical Impressions(s) / ED Diagnoses   Final diagnoses:  None    ED Discharge Orders    None       Lacretia Leigh, MD 04/01/18 2312

## 2018-04-01 NOTE — Telephone Encounter (Signed)
This RN spoke with pt regarding nose bleeds. Pt reports that he's losing "about 8-10 drops of blood" with each nose bleed. Per Dr. [name], pt to continue to monitor bleeding. Pt voiced understanding. Will alert clinic if necessary

## 2018-04-01 NOTE — ED Notes (Signed)
Called pharmacy said they will tube up ensure asap

## 2018-04-01 NOTE — Telephone Encounter (Signed)
"  Ronald Johnson calling about my husband Ronald Johnson.  He's had a nose bleed for the past three weeks.  This week  It has become a daily thing.  He puts tissue in his nose.   It's not a continuous drip.  It's just blood in his nose. He's not worn oxygen in the past two days.  He's on Xarelto."  Reviewed nasal hygiene for nose bleeds before call transfer.

## 2018-04-01 NOTE — ED Triage Notes (Signed)
Pt BIBA, initially for esophageal blockage. While en route, blockage cleared. EMS checked pt sugar, and it was 45 upon arrival to ED.

## 2018-04-01 NOTE — ED Notes (Signed)
Bed: WA20 Expected date:  Expected time:  Means of arrival:  Comments: EMS-esophageal blockage,hx of same

## 2018-04-02 ENCOUNTER — Other Ambulatory Visit: Payer: Self-pay

## 2018-04-02 DIAGNOSIS — E11649 Type 2 diabetes mellitus with hypoglycemia without coma: Secondary | ICD-10-CM | POA: Diagnosis not present

## 2018-04-02 DIAGNOSIS — K210 Gastro-esophageal reflux disease with esophagitis: Secondary | ICD-10-CM | POA: Diagnosis not present

## 2018-04-02 DIAGNOSIS — D539 Nutritional anemia, unspecified: Secondary | ICD-10-CM

## 2018-04-02 DIAGNOSIS — J690 Pneumonitis due to inhalation of food and vomit: Secondary | ICD-10-CM | POA: Diagnosis not present

## 2018-04-02 DIAGNOSIS — F411 Generalized anxiety disorder: Secondary | ICD-10-CM | POA: Diagnosis not present

## 2018-04-02 DIAGNOSIS — Z86711 Personal history of pulmonary embolism: Secondary | ICD-10-CM | POA: Diagnosis not present

## 2018-04-02 DIAGNOSIS — Z794 Long term (current) use of insulin: Secondary | ICD-10-CM

## 2018-04-02 DIAGNOSIS — C155 Malignant neoplasm of lower third of esophagus: Secondary | ICD-10-CM | POA: Diagnosis not present

## 2018-04-02 DIAGNOSIS — R0602 Shortness of breath: Secondary | ICD-10-CM

## 2018-04-02 LAB — CBC
HCT: 23.3 % — ABNORMAL LOW (ref 39.0–52.0)
Hemoglobin: 7.7 g/dL — ABNORMAL LOW (ref 13.0–17.0)
MCH: 32.9 pg (ref 26.0–34.0)
MCHC: 33 g/dL (ref 30.0–36.0)
MCV: 99.6 fL (ref 78.0–100.0)
PLATELETS: 64 10*3/uL — AB (ref 150–400)
RBC: 2.34 MIL/uL — AB (ref 4.22–5.81)
RDW: 16.8 % — ABNORMAL HIGH (ref 11.5–15.5)
WBC: 4.3 10*3/uL (ref 4.0–10.5)

## 2018-04-02 LAB — TYPE AND SCREEN
ABO/RH(D): A POS
Antibody Screen: NEGATIVE
UNIT DIVISION: 0
Unit division: 0
Unit division: 0

## 2018-04-02 LAB — BPAM RBC
Blood Product Expiration Date: 201908312359
Blood Product Expiration Date: 201909052359
Blood Product Expiration Date: 201909052359
ISSUE DATE / TIME: 201908122359
ISSUE DATE / TIME: 201908121944
UNIT TYPE AND RH: 6200
UNIT TYPE AND RH: 6200
Unit Type and Rh: 6200

## 2018-04-02 LAB — BASIC METABOLIC PANEL
ANION GAP: 9 (ref 5–15)
BUN: 17 mg/dL (ref 8–23)
CO2: 24 mmol/L (ref 22–32)
Calcium: 8.6 mg/dL — ABNORMAL LOW (ref 8.9–10.3)
Chloride: 107 mmol/L (ref 98–111)
Creatinine, Ser: 0.93 mg/dL (ref 0.61–1.24)
Glucose, Bld: 151 mg/dL — ABNORMAL HIGH (ref 70–99)
POTASSIUM: 3.9 mmol/L (ref 3.5–5.1)
SODIUM: 140 mmol/L (ref 135–145)

## 2018-04-02 LAB — PREPARE RBC (CROSSMATCH)

## 2018-04-02 LAB — GLUCOSE, CAPILLARY
GLUCOSE-CAPILLARY: 184 mg/dL — AB (ref 70–99)
Glucose-Capillary: 149 mg/dL — ABNORMAL HIGH (ref 70–99)

## 2018-04-02 MED ORDER — ESCITALOPRAM OXALATE 10 MG PO TABS
20.0000 mg | ORAL_TABLET | Freq: Every day | ORAL | Status: DC
  Administered 2018-04-02: 20 mg via ORAL
  Filled 2018-04-02 (×2): qty 2

## 2018-04-02 MED ORDER — RIVAROXABAN 20 MG PO TABS
20.0000 mg | ORAL_TABLET | Freq: Every day | ORAL | Status: DC
  Administered 2018-04-02: 20 mg via ORAL
  Filled 2018-04-02: qty 1

## 2018-04-02 MED ORDER — DEXTROSE 5 % IV SOLN: INTRAVENOUS | Status: DC

## 2018-04-02 MED ORDER — SODIUM CHLORIDE 0.9% FLUSH
3.0000 mL | Freq: Two times a day (BID) | INTRAVENOUS | Status: DC
  Administered 2018-04-02: 3 mL via INTRAVENOUS

## 2018-04-02 MED ORDER — SODIUM CHLORIDE 0.9% IV SOLUTION
Freq: Once | INTRAVENOUS | Status: AC
  Administered 2018-04-02: 13:00:00 via INTRAVENOUS

## 2018-04-02 MED ORDER — ONDANSETRON HCL 4 MG/2ML IJ SOLN: 4.0000 mg | Freq: Four times a day (QID) | INTRAMUSCULAR | Status: DC | PRN

## 2018-04-02 MED ORDER — GUAIFENESIN ER 600 MG PO TB12: 600.0000 mg | ORAL_TABLET | Freq: Two times a day (BID) | ORAL | Status: AC

## 2018-04-02 MED ORDER — LIDOCAINE-PRILOCAINE 2.5-2.5 % EX CREA: 1.0000 "application " | TOPICAL_CREAM | Freq: Once | CUTANEOUS | Status: DC

## 2018-04-02 MED ORDER — SODIUM CHLORIDE 0.9 % IV SOLN
3.0000 g | Freq: Three times a day (TID) | INTRAVENOUS | Status: DC
  Administered 2018-04-02: 3 g via INTRAVENOUS
  Filled 2018-04-02 (×3): qty 3

## 2018-04-02 MED ORDER — INSULIN ASPART PROT & ASPART (70-30 MIX) 100 UNIT/ML ~~LOC~~ SUSP
50.0000 [IU] | Freq: Every day | SUBCUTANEOUS | Status: DC
  Administered 2018-04-02: 50 [IU] via SUBCUTANEOUS
  Filled 2018-04-02: qty 10

## 2018-04-02 MED ORDER — ONDANSETRON HCL 4 MG PO TABS: 4.0000 mg | ORAL_TABLET | Freq: Four times a day (QID) | ORAL | Status: DC | PRN

## 2018-04-02 MED ORDER — ACETAMINOPHEN 325 MG PO TABS: 650.0000 mg | ORAL_TABLET | Freq: Four times a day (QID) | ORAL | Status: DC | PRN

## 2018-04-02 MED ORDER — ACETAMINOPHEN 650 MG RE SUPP: 650.0000 mg | Freq: Four times a day (QID) | RECTAL | Status: DC | PRN

## 2018-04-02 MED ORDER — FLUTICASONE FUROATE-VILANTEROL 100-25 MCG/INH IN AEPB
1.0000 | INHALATION_SPRAY | Freq: Every day | RESPIRATORY_TRACT | Status: DC
  Administered 2018-04-02: 1 via RESPIRATORY_TRACT
  Filled 2018-04-02: qty 28

## 2018-04-02 MED ORDER — SODIUM CHLORIDE 0.9% FLUSH
10.0000 mL | INTRAVENOUS | Status: DC | PRN
  Administered 2018-04-02: 10 mL
  Filled 2018-04-02: qty 40

## 2018-04-02 MED ORDER — INSULIN ASPART PROT & ASPART (70-30 MIX) 100 UNIT/ML ~~LOC~~ SUSP
70.0000 [IU] | Freq: Every day | SUBCUTANEOUS | Status: DC
  Filled 2018-04-02: qty 10

## 2018-04-02 MED ORDER — SODIUM CHLORIDE 0.9% IV SOLUTION: Freq: Once | INTRAVENOUS | Status: AC

## 2018-04-02 MED ORDER — PANTOPRAZOLE SODIUM 40 MG PO TBEC
40.0000 mg | DELAYED_RELEASE_TABLET | Freq: Two times a day (BID) | ORAL | Status: DC
  Administered 2018-04-02: 40 mg via ORAL
  Filled 2018-04-02: qty 1

## 2018-04-02 MED ORDER — TOPIRAMATE 100 MG PO TABS
100.0000 mg | ORAL_TABLET | Freq: Every day | ORAL | Status: DC
  Administered 2018-04-02: 100 mg via ORAL
  Filled 2018-04-02: qty 1

## 2018-04-02 MED ORDER — INSULIN ASPART PROT & ASPART (70-30 MIX) 100 UNIT/ML ~~LOC~~ SUSP: 50.0000 [IU] | Freq: Every day | SUBCUTANEOUS | 11 refills | Status: AC

## 2018-04-02 MED ORDER — ONDANSETRON HCL 4 MG PO TABS: 4.0000 mg | ORAL_TABLET | Freq: Four times a day (QID) | ORAL | 0 refills | Status: DC | PRN

## 2018-04-02 MED ORDER — DIAZEPAM 5 MG PO TABS: 5.0000 mg | ORAL_TABLET | Freq: Four times a day (QID) | ORAL | Status: DC | PRN

## 2018-04-02 MED ORDER — IPRATROPIUM-ALBUTEROL 0.5-2.5 (3) MG/3ML IN SOLN: 3.0000 mL | RESPIRATORY_TRACT | Status: DC | PRN

## 2018-04-02 MED ORDER — TOPIRAMATE 25 MG PO TABS
50.0000 mg | ORAL_TABLET | Freq: Every day | ORAL | Status: DC
  Administered 2018-04-02: 50 mg via ORAL
  Filled 2018-04-02: qty 2

## 2018-04-02 MED ORDER — AMOXICILLIN-POT CLAVULANATE 875-125 MG PO TABS: 1.0000 | ORAL_TABLET | Freq: Two times a day (BID) | ORAL | 0 refills | Status: AC

## 2018-04-02 MED ORDER — GUAIFENESIN ER 600 MG PO TB12
600.0000 mg | ORAL_TABLET | Freq: Two times a day (BID) | ORAL | Status: DC
  Administered 2018-04-02 (×2): 600 mg via ORAL
  Filled 2018-04-02 (×2): qty 1

## 2018-04-02 MED ORDER — HEPARIN SOD (PORK) LOCK FLUSH 100 UNIT/ML IV SOLN
500.0000 [IU] | INTRAVENOUS | Status: AC | PRN
  Administered 2018-04-02: 500 [IU]

## 2018-04-02 NOTE — Care Management Obs Status (Signed)
[facility] NOTIFICATION   Patient Details  Name: Ronald Johnson MRN: [id number] Date of Birth: 31-Aug-1945   Medicare Observation Status Notification Given:       Purcell Mouton, RN 04/02/2018, 4:02 PM

## 2018-04-02 NOTE — ED Notes (Signed)
ED TO INPATIENT HANDOFF REPORT  Name/Age/Gender Ronald Johnson 72 y.o. male  Code Status Code Status History    Date Active Date Inactive Code Status Order ID Comments User Context   02/24/2018 1553 02/27/2018 1918 Partial Code [id number]  Rush Farmer, MD ED   08/18/2015 1658 08/22/2015 1603 Full Code [id number]  Barton Dubois, MD Inpatient   07/25/2015 1534 08/06/2015 1701 Full Code [id number]  John Giovanni, PA-C Inpatient   09/10/2011 1154 09/12/2011 2015 Full Code [id number]  Jari Sportsman, RN Inpatient    Questions for Most Recent Historical Code Status (Order [id number])    Question Answer Comment   In the event of cardiac or respiratory ARREST: Initiate Code Blue, Call Rapid Response No    In the event of cardiac or respiratory ARREST: Perform CPR No    In the event of cardiac or respiratory ARREST: Perform Intubation/Mechanical Ventilation No    In the event of cardiac or respiratory ARREST: Use NIPPV/BiPAp only if indicated No    In the event of cardiac or respiratory ARREST: Administer ACLS medications if indicated No    In the event of cardiac or respiratory ARREST: Perform Defibrillation or Cardioversion if indicated No    Comments Pressors only and limit to levophed to 10 mcg         Advance Directive Documentation     Most Recent Value  Type of Advance Directive  Healthcare Power of Attorney, Living will  Pre-existing out of facility DNR order (yellow form or pink MOST form)  -  "MOST" Form in Place?  -      Home/SNF/Other Home  Chief Complaint shortness of breath  Level of Care/Admitting Diagnosis ED Disposition    ED Disposition Condition [place]: [place] [[id number]]  Level of Care: Telemetry [5]  Admit to tele based on following criteria: Complex arrhythmia (Bradycardia/Tachycardia)  Diagnosis: Aspiration pneumonia [facility]) [[id number]]  Admitting Physician: Norval Morton [[id number]]  Attending Physician: Norval Morton [[id number]]  PT Class (Do Not Modify): Observation [104]  PT Acc Code (Do Not Modify): Observation [10022]       Medical History Past Medical History:  Diagnosis Date  . Anemia 08/15/2009  . Anxiety   . Arthritis   . [name]'s esophagus 2007  . COLONIC POLYPS, ADENOMATOUS 08/01/2008, 2013, 2014  . COPD (chronic obstructive pulmonary disease) (HCC)    no per pt  . Depression   . Diabetes type 2, controlled ([place]) 03/13/2007  . Diverticulitis   . ED (erectile dysfunction)   . EMPHYSEMA 11/08/2008   no per pt  . Esophageal cancer ([place]) "dx'd ~ 01/2015"  . GERD 03/13/2007  . GIB (gastrointestinal bleeding) 08/18/2015  . Hiatal hernia   . History of blood transfusion 08/18/2015   due to GIB  . HYPERCHOLESTEROLEMIA 02/03/2008  . HYPERTENSION 03/13/2007   pt denies, claims white coat syndrome  . Pneumonia   . Prostatism   . PULMONARY NODULE 11/24/2008  . Stomach cancer ([place]) "dx'd ~ 01/2015"  . TOBACCO ABUSE 11/24/2008    Allergies No Known Allergies  IV Location/Drains/Wounds Patient Lines/Drains/Airways Status   Active Line/Drains/Airways    Name:   Placement date:   Placement time:   Site:   Days:   Implanted Port 01/05/18 Left Chest   01/05/18    1531    Chest   87   Gastrostomy/Enterostomy Jejunostomy 18 Fr. LLQ   07/25/15    1436  LLQ   982   External Urinary Catheter   02/24/18    1214    -   37   External Urinary Catheter   02/25/18    1813    -   36   Incision (Closed) 07/25/15 Abdomen Other (Comment)   07/25/15    1513     982   Incision (Closed) 08/26/17 Cervical Right   08/26/17    1328     219   Incision (Closed) 01/22/18 N/A Other (Comment)   01/22/18    1314     70          Labs/Imaging Results for orders placed or performed during the hospital encounter of 04/01/18 (from the past 48 hour(s))  POC CBG, ED     Status: None   Collection Time: 04/01/18  7:26 PM  Result Value Ref Range   Glucose-Capillary 74 70 - 99 mg/dL  POC CBG, ED     Status: None    Collection Time: 04/01/18  8:23 PM  Result Value Ref Range   Glucose-Capillary 97 70 - 99 mg/dL  POC CBG, ED     Status: None   Collection Time: 04/01/18  9:26 PM  Result Value Ref Range   Glucose-Capillary 71 70 - 99 mg/dL  CBG monitoring, ED     Status: None   Collection Time: 04/01/18  9:59 PM  Result Value Ref Range   Glucose-Capillary 75 70 - 99 mg/dL  CBC with Differential/Platelet     Status: Abnormal   Collection Time: 04/01/18 10:32 PM  Result Value Ref Range   WBC 6.6 4.0 - 10.5 K/uL   RBC 2.83 (L) 4.22 - 5.81 MIL/uL   Hemoglobin 9.2 (L) 13.0 - 17.0 g/dL   HCT 28.5 (L) 39.0 - 52.0 %   MCV 100.7 (H) 78.0 - 100.0 fL   MCH 32.5 26.0 - 34.0 pg   MCHC 32.3 30.0 - 36.0 g/dL   RDW 16.8 (H) 11.5 - 15.5 %   Platelets 89 (L) 150 - 400 K/uL    Comment: REPEATED TO VERIFY SPECIMEN CHECKED FOR CLOTS PLATELET COUNT CONFIRMED BY SMEAR    Neutrophils Relative % 72 %   Neutro Abs 4.7 1.7 - 7.7 K/uL   Lymphocytes Relative 18 %   Lymphs Abs 1.2 0.7 - 4.0 K/uL   Monocytes Relative 8 %   Monocytes Absolute 0.6 0.1 - 1.0 K/uL   Eosinophils Relative 2 %   Eosinophils Absolute 0.1 0.0 - 0.7 K/uL   Basophils Relative 0 %   Basophils Absolute 0.0 0.0 - 0.1 K/uL   WBC Morphology DOHLE BODIES     Comment: Performed at [facility], [place] [address] [place], [place] [address]  Basic metabolic panel     Status: Abnormal   Collection Time: 04/01/18 10:32 PM  Result Value Ref Range   Sodium 142 135 - 145 mmol/L   Potassium 4.0 3.5 - 5.1 mmol/L   Chloride 107 98 - 111 mmol/L   CO2 24 22 - 32 mmol/L   Glucose, Bld 147 (H) 70 - 99 mg/dL   BUN 18 8 - 23 mg/dL   Creatinine, Ser 0.93 0.61 - 1.24 mg/dL   Calcium 8.8 (L) 8.9 - 10.3 mg/dL   GFR calc non Af Amer >60 >60 mL/min   GFR calc Af Amer >60 >60 mL/min    Comment: (NOTE) The eGFR has been calculated using the CKD EPI equation. This calculation has not been validated  in all clinical situations. eGFR's persistently  <60 mL/min signify possible Chronic Kidney Disease.    Anion gap 11 5 - 15    Comment: Performed at [facility], [place] [address] [place], [place] [address]   Dg Chest 2 View  Result Date: 04/01/2018 CLINICAL DATA:  Short of breath EXAM: CHEST - 2 VIEW COMPARISON:  03/30/2018, 03/26/2018, 02/24/2018 FINDINGS: Left-sided central venous port tip overlies the distal SVC. Mild emphysematous disease. More confluent regions of airspace disease at the bilateral lung bases, lingula and right middle lobe. Tiny pleural effusions are suspected. Stable cardiomediastinal silhouette. No pneumothorax. IMPRESSION: 1. Small pleural effusions with bilateral lower lung airspace disease suspicious for pneumonia. Electronically Signed   By: Donavan Foil M.D.   On: 04/01/2018 22:53    Pending Labs Unresulted Labs (From admission, onward)   None      Vitals/Pain Today's Vitals   04/01/18 2330 04/02/18 0000 04/02/18 0030 04/02/18 0100  BP: (!) 141/73 (!) 150/67 (!) 160/70 (!) 165/70  Pulse: (!) 115 (!) 114 (!) 118 (!) 115  Resp: (!) 27 (!) 24 (!) 23 (!) 25  Temp:      TempSrc:      SpO2: 93% 93% 93% 92%  Weight:      Height:      PainSc:        Isolation Precautions No active isolations  Medications Medications  feeding supplement (ENSURE ENLIVE) (ENSURE ENLIVE) liquid 237 mL (237 mLs Oral Given 04/01/18 1918)  Ampicillin-Sulbactam (UNASYN) 3 g in sodium chloride 0.9 % 100 mL IVPB (0 g Intravenous Stopped 04/01/18 2358)    Mobility walks

## 2018-04-02 NOTE — Discharge Summary (Signed)
Physician Discharge Summary  Ronald Johnson WHQ:[id number] DOB: Nov 12, 1945 DOA: 04/01/2018  PCP: Ronald Shin, MD  Admit date: 04/01/2018 Discharge date: 04/02/2018  Admitted From: Home Disposition: Home Recommendations for Outpatient Follow-up:  1. Follow up with PCP in 1-2 weeks 2. Please obtain BMP/CBC in one week   Home Health: Stable Equipment/Devices none Discharge Condition: Stable CODE STATUS: Partial code Diet recommendation regular diet : Brief/Interim Summary:72 y.o. male with medical history significant of esophageal cancer s/p resection followed by Dr. Benay Johnson, dysphagia, DM type II, PE 07/2015 and 02/2018 on Xarelto, COPD oxygen dependent on 2 L of nasal cannula, anemia, anxiety, depression, and GERD; who presents after getting choked up on a piece of chicken with his dinner around 6 PM.  He had been doing well up until that point. En route esophageal blockage was reported to have cleared, but initial blood glucose was noted as low as 45.  Since the blockage cleared patient reports having a cough as he believes some of the food went down the wrong way.  Denies having any significant fever, chills, chest pain, nausea, vomiting, diarrhea, dysuria, or abdominal pain.  Patient had just been recently hospitalized for for severe sepsis related with loculated pleural effusion in July.   ED Course: Upon admission into the emergency department patient was noted to be afebrile, pulse 87-1 17, respiration 14-31, blood pressures maintained, and O2 saturations 90-93% on 3 L of nasal cannula oxygen.  Labs revealed WBC 6.6, hemoglobin 9.2, and platelets 89.  Patient had been given Ensure in the emergency department with improvement of blood sugars.  Chest x-ray showed small pleural effusions with bilateral airspace disease suspicious for pneumonia.  Patient was started on Unasyn for suspected aspiration pneumonia.  [facility] called to admit.    Discharge Diagnoses:  Principal Problem:   Aspiration  pneumonia ([place]) Active Problems:   Anxiety state   GERD   Esophageal cancer (HCC)   Macrocytic anemia   Hx pulmonary embolism   Type 2 diabetes mellitus with hypoglycemia without coma (HCC)  1] aspiration pneumonia patient choked on chicken while eating dinner last night.  This morning he is awake alert on 2 L of oxygen which is his baseline.  He feels his breathing is back to normal.  He is eager and anxious to be discharged home today.  He was treated with Unasyn today.  He will be discharged on Augmentin.  2] anemia microcytic chronic patient got blood transfusion few days ago his hemoglobin today 7.7.  Denies any blood in the urine or stool.  Will transfuse 1 unit of RBCs prior to discharge.  3] type 2 diabetes with hyperglycemia decrease 70/30 insulin to 50 units in the morning.  4] history of PE continue Xarelto.  5] COPD without exacerbation on 2 L of oxygen prior to admission.  6] history of esophageal cancer status post resection of GE junction adenocarcinoma follow-up with Dr. Benay Johnson.  Discharge Instructions  Discharge Instructions    Call MD for:  difficulty breathing, headache or visual disturbances   Complete by:  As directed    Call MD for:  persistant dizziness or light-headedness   Complete by:  As directed    Call MD for:  persistant nausea and vomiting   Complete by:  As directed    Call MD for:  severe uncontrolled pain   Complete by:  As directed    Call MD for:  temperature >100.4   Complete by:  As directed    Diet -  low sodium heart healthy   Complete by:  As directed    Increase activity slowly   Complete by:  As directed      Allergies as of 04/02/2018   No Known Allergies     Medication List    STOP taking these medications   insulin NPH-regular Human (70-30) 100 UNIT/ML injection Commonly known as:  NOVOLIN 70/30 Replaced by:  insulin aspart protamine- aspart (70-30) 100 UNIT/ML injection   oxyCODONE 5 MG immediate release  tablet Commonly known as:  Oxy IR/ROXICODONE   sildenafil 20 MG tablet Commonly known as:  REVATIO     TAKE these medications   albuterol 108 (90 Base) MCG/ACT inhaler Commonly known as:  PROVENTIL HFA;VENTOLIN HFA Inhale 2 puffs into the lungs every 6 (six) hours as needed for wheezing or shortness of breath.   amoxicillin-clavulanate 875-125 MG tablet Commonly known as:  AUGMENTIN Take 1 tablet by mouth 2 (two) times daily for 10 days.   diazepam 5 MG tablet Commonly known as:  VALIUM Take 5 mg by mouth every 6 (six) hours as needed for anxiety.   escitalopram 20 MG tablet Commonly known as:  LEXAPRO Take 1 tablet (20 mg total) by mouth daily.   fluticasone furoate-vilanterol 100-25 MCG/INH Aepb Commonly known as:  BREO ELLIPTA Inhale 1 puff into the lungs daily.   glucagon 1 MG injection Inject 1 mg into the vein once as needed for up to 1 dose. What changed:  reasons to take this   glucose blood test strip Check blood sugar three times a day dx 250.01   guaiFENesin 600 MG 12 hr tablet Commonly known as:  MUCINEX Take 1 tablet (600 mg total) by mouth 2 (two) times daily.   insulin aspart protamine- aspart (70-30) 100 UNIT/ML injection Commonly known as:  NOVOLOG MIX 70/30 Inject 0.5 mLs (50 Units total) into the skin daily with breakfast. Start taking on:  04/03/2018 Replaces:  insulin NPH-regular Human (70-30) 100 UNIT/ML injection   Insulin Syringe-Needle U-100 30G X 3/8" 1 ML Misc Use to inject insulin 3 times daily.   lidocaine-prilocaine cream Commonly known as:  EMLA Apply to Port-A-Cath site 1 hour prior to use What changed:    how much to take  how to take this  when to take this   ondansetron 4 MG tablet Commonly known as:  ZOFRAN Take 1 tablet (4 mg total) by mouth every 6 (six) hours as needed for nausea.   prochlorperazine 5 MG tablet Commonly known as:  COMPAZINE Take 1 tablet (5 mg total) by mouth every 6 (six) hours as needed for  nausea or vomiting.   RABEprazole 20 MG tablet Commonly known as:  ACIPHEX TAKE 1 TABLET(20 MG) BY MOUTH TWICE DAILY   rivaroxaban 20 MG Tabs tablet Commonly known as:  XARELTO Take 1 tablet (20 mg total) by mouth daily with supper.   topiramate 50 MG tablet Commonly known as:  TOPAMAX 2 in the AM, 1 at night What changed:    how much to take  how to take this  when to take this       No Known Allergies  Consultations:  None   Procedures/Studies: Dg Chest 1 View  Result Date: 03/26/2018 CLINICAL DATA:  72 year old male status post right thoracentesis EXAM: CHEST  1 VIEW COMPARISON:  03/20/2018 FINDINGS: Cardiomediastinal silhouette unchanged in size and contour. Decreased fluid at the right lung base with no pneumothorax. Blunting of the bilateral costophrenic angles persists. Unchanged left IJ  approach port catheter. Coarsened interstitial markings bilaterally with similar appearance of patchy opacity. IMPRESSION: No complicating features status post right thoracentesis. Likely trace pleural fluid bilaterally. Electronically Signed   By: Corrie Mckusick D.O.   On: 03/26/2018 13:38   Dg Chest 2 View  Result Date: 04/01/2018 CLINICAL DATA:  Short of breath EXAM: CHEST - 2 VIEW COMPARISON:  03/30/2018, 03/26/2018, 02/24/2018 FINDINGS: Left-sided central venous port tip overlies the distal SVC. Mild emphysematous disease. More confluent regions of airspace disease at the bilateral lung bases, lingula and right middle lobe. Tiny pleural effusions are suspected. Stable cardiomediastinal silhouette. No pneumothorax. IMPRESSION: 1. Small pleural effusions with bilateral lower lung airspace disease suspicious for pneumonia. Electronically Signed   By: Donavan Foil M.D.   On: 04/01/2018 22:53   Dg Chest 2 View  Result Date: 03/30/2018 CLINICAL DATA:  Hypotension. EXAM: CHEST - 2 VIEW COMPARISON:  03/26/2018 FINDINGS: Lungs are hyperexpanded. Interstitial markings are diffusely  coarsened with chronic features. Right pleuroparenchymal opacity is similar. Tiny right pleural effusion. The cardiopericardial silhouette is within normal limits for size. Left Port-A-Cath again noted. Bones are diffusely demineralized. IMPRESSION: Emphysema with similar appearance of right pleuroparenchymal opacity and tiny right effusion . Electronically Signed   By: Misty Stanley M.D.   On: 03/30/2018 18:11   Dg Chest 2 View  Result Date: 03/20/2018 CLINICAL DATA:  Pleurisy, follow-up from hospitalization 3 weeks ago, cough, shortness of breath and congestion remain, history RIGHT pleural effusion, diabetes mellitus, hypertension, former smoker, COPD EXAM: CHEST - 2 VIEW COMPARISON:  02/24/2018 FINDINGS: LEFT jugular Port-A-Cath with tip projecting over SVC. Normal heart size and pulmonary vascularity. Atherosclerotic calcifications aorta. Moderate-sized hiatal hernia. Persistent RIGHT pleural effusion and basilar atelectasis. Minimal atelectasis at LEFT base as well. Emphysematous and bronchitic changes consistent with underlying COPD. No pneumothorax or acute osseous findings. Scattered endplate spur formation thoracic spine. IMPRESSION: COPD changes with bibasilar atelectasis and persistent RIGHT pleural effusion. Moderate-sized hiatal hernia. Electronically Signed   By: Lavonia Dana M.D.   On: 03/20/2018 11:03   US Thoracentesis Asp Pleural Space W/img Guide  Result Date: 03/26/2018 INDICATION: Patient with history of esophageal cancer, dyspnea, right pleural effusion. Request made for diagnostic and therapeutic right thoracentesis. EXAM: ULTRASOUND GUIDED DIAGNOSTIC AND THERAPEUTIC RIGHT THORACENTESIS MEDICATIONS: None COMPLICATIONS: None immediate. PROCEDURE: An ultrasound guided thoracentesis was thoroughly discussed with the patient and questions answered. The benefits, risks, alternatives and complications were also discussed. The patient understands and wishes to proceed with the procedure.  Written consent was obtained. Ultrasound was performed to localize and mark an adequate pocket of fluid in the right chest. The area was then prepped and draped in the normal sterile fashion. 1% Lidocaine was used for local anesthesia. Under ultrasound guidance a 6 Fr Safe-T-Centesis catheter was introduced. Thoracentesis was performed. The catheter was removed and a dressing applied. FINDINGS: A total of approximately 1.7 liters of yellow fluid was removed. Samples were sent to the laboratory as requested by the clinical team. IMPRESSION: Successful ultrasound guided diagnostic and therapeutic right thoracentesis yielding 1.7 liters of pleural fluid. Read by: Rowe Robert, PA-C Electronically Signed   By: Aletta Edouard M.D.   On: 03/26/2018 13:24   (Echo, Carotid, EGD, Colonoscopy, ERCP)    Subjective:   Discharge Exam: Vitals:   04/02/18 0917 04/02/18 0920  BP:    Pulse:    Resp:    Temp:    SpO2: 94% 94%   Vitals:   04/02/18 0146 04/02/18 0600 04/02/18 0917  04/02/18 0920  BP: 109/60 (!) 114/58    Pulse: (!) 102 84    Resp: 16 18    Temp: 97.8 F (36.6 C) 98.7 F (37.1 C)    TempSrc: Oral Oral    SpO2: 96% 93% 94% 94%  Weight:      Height:        General: Pt is alert, awake, not in acute distress Cardiovascular: RRR, S1/S2 +, no rubs, no gallops Respiratory: CTA bilaterally, no wheezing, no rhonchi Abdominal: Soft, NT, ND, bowel sounds + Extremities: no edema, no cyanosis    The results of significant diagnostics from this hospitalization (including imaging, microbiology, ancillary and laboratory) are listed below for reference.     Microbiology: No results found for this or any previous visit (from the past 240 hour(s)).   Labs: BNP (last 3 results) No results for input(s): BNP in the last 8760 hours. Basic Metabolic Panel: Recent Labs  Lab 03/30/18 1654 04/01/18 2232 04/02/18 0446  NA 140 142 140  K 3.7 4.0 3.9  CL 103 107 107  CO2 26 24 24   GLUCOSE 99  147* 151*  BUN 31* 18 17  CREATININE 1.04 0.93 0.93  CALCIUM 8.6* 8.8* 8.6*   Liver Function Tests: Recent Labs  Lab 03/30/18 1654  AST 18  ALT 9  ALKPHOS 107  BILITOT 0.4  PROT 6.2*  ALBUMIN 2.9*   No results for input(s): LIPASE, AMYLASE in the last 168 hours. No results for input(s): AMMONIA in the last 168 hours. CBC: Recent Labs  Lab 03/30/18 1654 04/01/18 2232 04/02/18 0446  WBC 7.7 6.6 4.3  NEUTROABS 6.0 4.7  --   HGB 6.6* 9.2* 7.7*  HCT 20.1* 28.5* 23.3*  MCV 103.1* 100.7* 99.6  PLT 102* 89* 64*   Cardiac Enzymes: No results for input(s): CKTOTAL, CKMB, CKMBINDEX, TROPONINI in the last 168 hours. BNP: Invalid input(s): POCBNP CBG: Recent Labs  Lab 04/01/18 1926 04/01/18 2023 04/01/18 2126 04/01/18 2159 04/02/18 0830  GLUCAP 74 97 71 75 184*   D-Dimer No results for input(s): DDIMER in the last 72 hours. Hgb A1c No results for input(s): HGBA1C in the last 72 hours. Lipid Profile No results for input(s): CHOL, HDL, LDLCALC, TRIG, CHOLHDL, LDLDIRECT in the last 72 hours. Thyroid function studies No results for input(s): TSH, T4TOTAL, T3FREE, THYROIDAB in the last 72 hours.  Invalid input(s): FREET3 Anemia work up No results for input(s): VITAMINB12, FOLATE, FERRITIN, TIBC, IRON, RETICCTPCT in the last 72 hours. Urinalysis    Component Value Date/Time   COLORURINE YELLOW 03/30/2018 1751   APPEARANCEUR CLEAR 03/30/2018 1751   LABSPEC 1.021 03/30/2018 1751   PHURINE 5.0 03/30/2018 1751   GLUCOSEU NEGATIVE 03/30/2018 1751   GLUCOSEU >=1000 (A) 09/01/2015 1556   HGBUR NEGATIVE 03/30/2018 1751   BILIRUBINUR NEGATIVE 03/30/2018 1751   KETONESUR NEGATIVE 03/30/2018 1751   PROTEINUR NEGATIVE 03/30/2018 1751   UROBILINOGEN 0.2 09/01/2015 1556   NITRITE NEGATIVE 03/30/2018 1751   LEUKOCYTESUR NEGATIVE 03/30/2018 1751   Sepsis Labs Invalid input(s): PROCALCITONIN,  WBC,  LACTICIDVEN Microbiology No results found for this or any previous visit  (from the past 240 hour(s)).   Time coordinating discharge:  35 minutes  SIGNED:   Georgette Shell, MD  [facility] 04/02/2018, 11:24 AM Pager   If 7PM-7AM, please contact night-coverage [email or web address] Password [email or web address]

## 2018-04-02 NOTE — H&P (Addendum)
History and Physical    [name] UUV:[id number] DOB: [birth year] DOA: 04/01/2018  Referring MD/NP/PA: Lacretia Leigh, MD PCP: [name], MD  Patient coming from: Home via EMS  Chief Complaint: Choked on chicken  I have personally briefly reviewed patient's old medical records in [place]   HPI: Ronald Johnson is a 72 y.o. male with medical history significant of esophageal cancer s/p resection followed by Dr. [name], dysphagia, DM type II, PE 07/2015 and 02/2018 on Xarelto, COPD oxygen dependent on 2 L of nasal cannula, anemia, anxiety, depression, and GERD; who presents after getting choked up on a piece of chicken with his dinner around 6 PM.  He had been doing well up until that point. En route esophageal blockage was reported to have cleared, but initial blood glucose was noted as low as 45.  Since the blockage cleared patient reports having a cough as he believes some of the food went down the wrong way.  Denies having any significant fever, chills, chest pain, nausea, vomiting, diarrhea, dysuria, or abdominal pain.  Patient had just been recently hospitalized for for severe sepsis related with loculated pleural effusion in July.   ED Course: Upon admission into the emergency department patient was noted to be afebrile, pulse 87-1 17, respiration 14-31, blood pressures maintained, and O2 saturations 90-93% on 3 L of nasal cannula oxygen.  Labs revealed WBC 6.6, hemoglobin 9.2, and platelets 89.  Patient had been given Ensure in the emergency department with improvement of blood sugars.  Chest x-ray showed small pleural effusions with bilateral airspace disease suspicious for pneumonia.  Patient was started on Unasyn for suspected aspiration pneumonia.  TRH called to admit.  Review of Systems  Constitutional: Negative for chills, fever and weight loss.  HENT: Positive for nosebleeds and sore throat. Negative for ear discharge.   Eyes: Negative for double vision and  photophobia.  Respiratory: Positive for cough and shortness of breath. Negative for wheezing.   Cardiovascular: Negative for chest pain and leg swelling.  Gastrointestinal: Negative for abdominal pain, nausea and vomiting.  Genitourinary: Negative for dysuria and hematuria.  Musculoskeletal: Negative for falls and myalgias.  Neurological: Negative for loss of consciousness and weakness.  Psychiatric/Behavioral: Negative for substance abuse. The patient is nervous/anxious.     Past Medical History:  Diagnosis Date  . Anemia 08/15/2009  . Anxiety   . Arthritis   . [name]'s esophagus 2007  . COLONIC POLYPS, ADENOMATOUS 08/01/2008, 2013, 2014  . COPD (chronic obstructive pulmonary disease) (HCC)    no per pt  . Depression   . Diabetes type 2, controlled ([place]) 03/13/2007  . Diverticulitis   . ED (erectile dysfunction)   . EMPHYSEMA 11/08/2008   no per pt  . Esophageal cancer ([place]) "dx'd ~ 01/2015"  . GERD 03/13/2007  . GIB (gastrointestinal bleeding) 08/18/2015  . Hiatal hernia   . History of blood transfusion 08/18/2015   due to GIB  . HYPERCHOLESTEROLEMIA 02/03/2008  . HYPERTENSION 03/13/2007   pt denies, claims white coat syndrome  . Pneumonia   . Prostatism   . PULMONARY NODULE 11/24/2008  . Stomach cancer ([place]) "dx'd ~ 01/2015"  . TOBACCO ABUSE 11/24/2008    Past Surgical History:  Procedure Laterality Date  . BALLOON DILATION N/A 03/19/2016   Procedure: BALLOON DILATION;  Surgeon: Irene Shipper, MD;  Location: [facility];  Service: Endoscopy;  Laterality: N/A;  . CIRCUMCISION    . COLONOSCOPY    . COMPLETE ESOPHAGECTOMY N/A 07/25/2015  Procedure: TRANSHIATAL TOTAL ESOPHAGECTOMY COMPLETE PYLOROMYOTOMY;  Surgeon: [name], MD;  Location: [facility];  Service: Thoracic;  Laterality: N/A;  . EGD  11/19/2005  . ESOPHAGOGASTRODUODENOSCOPY N/A 03/19/2016   Procedure: ESOPHAGOGASTRODUODENOSCOPY (EGD);  Surgeon: Irene Shipper, MD;  Location: [facility];  Service: Endoscopy;   Laterality: N/A;  . ESOPHAGOGASTRODUODENOSCOPY (EGD) WITH PROPOFOL N/A 09/15/2017   Procedure: ESOPHAGOGASTRODUODENOSCOPY (EGD) WITH PROPOFOL;  Surgeon: Irene Shipper, MD;  Location: [facility];  Service: Endoscopy;  Laterality: N/A;  . EUS N/A 03/16/2015   Procedure: UPPER ENDOSCOPIC ULTRASOUND (EUS) RADIAL;  Surgeon: Milus Banister, MD;  Location: [facility];  Service: Endoscopy;  Laterality: N/A;  . FOOT FRACTURE SURGERY Right 1980   right foot w/ pins and screws  . IR IMAGING GUIDED PORT INSERTION  01/05/2018  . IR US GUIDE VASC ACCESS LEFT  01/05/2018  . JEJUNOSTOMY N/A 07/25/2015   Procedure: FEEDING JEJUNOSTOMY;  Surgeon: [name], MD;  Location: [facility];  Service: Thoracic;  Laterality: N/A;  . MICROLARYNGOSCOPY W/VOCAL CORD INJECTION N/A 01/22/2018   Procedure: MICROLARYNGOSCOPY WITH VOCAL CORD INJECTION;  Surgeon: Melissa Montane, MD;  Location: [facility];  Service: ENT;  Laterality: N/A;  . POLYPECTOMY    . removal pins and screws foot  1980   right foot  . UPPER GASTROINTESTINAL ENDOSCOPY    . VASECTOMY    . VIDEO BRONCHOSCOPY N/A 07/25/2015   Procedure: VIDEO BRONCHOSCOPY;  Surgeon: [name], MD;  Location: [facility];  Service: Thoracic;  Laterality: N/A;     reports that he quit smoking about 9 years ago. His smoking use included cigarettes. He has a 35.00 pack-year smoking history. He has never used smokeless tobacco. He reports that he does not drink alcohol or use drugs.  No Known Allergies  Family History  Problem Relation Age of Onset  . Stroke Mother   . Diabetes Mother   . Diabetes Maternal Grandmother        mother side of the family aunts, MGF  . Breast cancer Paternal Grandmother   . AAA (abdominal aortic aneurysm) Father   . Colon cancer Neg Hx   . Esophageal cancer Neg Hx   . Rectal cancer Neg Hx   . Stomach cancer Neg Hx     Prior to Admission medications   Medication Sig Start Date End Date Taking? Authorizing Provider  albuterol (PROVENTIL  HFA;VENTOLIN HFA) 108 (90 Base) MCG/ACT inhaler Inhale 2 puffs into the lungs every 6 (six) hours as needed for wheezing or shortness of breath. 02/16/18  Yes Owens Shark, NP  diazepam (VALIUM) 5 MG tablet Take 5 mg by mouth every 6 (six) hours as needed for anxiety.   Yes [provider]  escitalopram (LEXAPRO) 20 MG tablet Take 1 tablet (20 mg total) by mouth daily. 10/14/17  Yes [name], MD  fluticasone furoate-vilanterol (BREO ELLIPTA) 100-25 MCG/INH AEPB Inhale 1 puff into the lungs daily. 03/06/18  Yes Martyn Ehrich, NP  glucagon (GLUCAGON EMERGENCY) 1 MG injection Inject 1 mg into the vein once as needed for up to 1 dose. Patient taking differently: Inject 1 mg into the vein once as needed (for low blood sugars).  11/20/17  Yes [name], MD  glucose blood (PRECISION XTRA TEST STRIPS) test strip Check blood sugar three times a day dx 250.01 06/17/14  Yes [name], MD  insulin NPH-regular Human (HUMULIN 70/30) (70-30) 100 UNIT/ML injection Inject 70-100 Units into the skin daily with breakfast. 03/17/18  Yes [name], MD  Insulin Syringe-Needle U-100 30G X 3/8" 1 ML MISC Use to inject insulin 3 times daily. 06/06/15  Yes [name], MD  lidocaine-prilocaine (EMLA) cream Apply to Port-A-Cath site 1 hour prior to use Patient taking differently: Apply 1 application topically once. Apply to Port-A-Cath site 1 hour prior to use 01/08/18  Yes Owens Shark, NP  prochlorperazine (COMPAZINE) 5 MG tablet Take 1 tablet (5 mg total) by mouth every 6 (six) hours as needed for nausea or vomiting. 01/08/18  Yes Owens Shark, NP  RABEprazole (ACIPHEX) 20 MG tablet TAKE 1 TABLET(20 MG) BY MOUTH TWICE DAILY 12/24/17  Yes Irene Shipper, MD  rivaroxaban (XARELTO) 20 MG TABS tablet Take 1 tablet (20 mg total) by mouth daily with supper. 03/04/18  Yes Ladell Pier, MD  topiramate (TOPAMAX) 50 MG tablet 2 in the AM, 1 at night Patient taking differently: Take 50-100 mg by mouth See  admin instructions. 2 in the AM, 1 at night 11/20/17  Yes Tat, Wells Guiles S, DO  oxyCODONE (OXY IR/ROXICODONE) 5 MG immediate release tablet Take 1 tablet (5 mg total) by mouth daily as needed for severe pain. Patient not taking: Reported on 03/30/2018 11/25/17   Ladell Pier, MD  sildenafil (REVATIO) 20 MG tablet 1-5 tabs as needed for ED symptoms Patient not taking: Reported on 03/30/2018 03/03/17   [name], MD    Physical Exam:  Constitutional: NAD, calm, comfortable Vitals:   04/01/18 2030 04/01/18 2205 04/01/18 2230 04/01/18 2300  BP: (!) 142/68 (!) 162/73 (!) 177/86 (!) 164/74  Pulse: 90 (!) 114 (!) 117 (!) 117  Resp: (!) 28 (!) 28 (!) 31 (!) 27  Temp:   98.2 F (36.8 C)   TempSrc:   Oral   SpO2: 97% 91% 90% 90%  Weight:      Height:       Eyes: PERRL, lids and conjunctivae normal ENMT: Mucous membranes are moist. Posterior pharynx clear of any exudate or lesions.   Neck: normal, supple, no masses, no thyromegaly Respiratory: clear to auscultation bilaterally, no wheezing, no crackles. Normal respiratory effort. No accessory muscle use.  Hoarse voice. Cardiovascular: Tachycardic, no murmurs / rubs / gallops. No extremity edema. 2+ pedal pulses. No carotid bruits.  Left upper chest wall port present. Abdomen: no tenderness, no masses palpated. No hepatosplenomegaly. Bowel sounds positive.  Musculoskeletal: no clubbing / cyanosis. No joint deformity upper and lower extremities. Good ROM, no contractures. Normal muscle tone.  Skin: no rashes, lesions, ulcers. No induration Neurologic: CN 2-12 grossly intact. Sensation intact, DTR normal. Strength 5/5 in all 4.  Psychiatric: Normal judgment and insight. Alert and oriented x 3. Normal mood.     Labs on Admission: I have personally reviewed following labs and imaging studies  CBC: Recent Labs  Lab 03/30/18 1654 04/01/18 2232  WBC 7.7 6.6  NEUTROABS 6.0 4.7  HGB 6.6* 9.2*  HCT 20.1* 28.5*  MCV 103.1* 100.7*  PLT 102* 89*    Basic Metabolic Panel: Recent Labs  Lab 03/30/18 1654 04/01/18 2232  NA 140 142  K 3.7 4.0  CL 103 107  CO2 26 24  GLUCOSE 99 147*  BUN 31* 18  CREATININE 1.04 0.93  CALCIUM 8.6* 8.8*   GFR: Estimated Creatinine Clearance: 92 mL/min (by C-G formula based on SCr of 0.93 mg/dL). Liver Function Tests: Recent Labs  Lab 03/30/18 1654  AST 18  ALT 9  ALKPHOS 107  BILITOT 0.4  PROT 6.2*  ALBUMIN 2.9*   No results for input(s): LIPASE, AMYLASE in the last 168 hours. No results for input(s): AMMONIA in the last 168 hours. Coagulation Profile: No results for input(s): INR, PROTIME in the last 168 hours. Cardiac Enzymes: No results for input(s): CKTOTAL, CKMB, CKMBINDEX, TROPONINI in the last 168 hours. BNP (last 3 results) No results for input(s): PROBNP in the last 8760 hours. HbA1C: No results for input(s): HGBA1C in the last 72 hours. CBG: Recent Labs  Lab 04/01/18 1926 04/01/18 2023 04/01/18 2126 04/01/18 2159  GLUCAP 74 97 71 75   Lipid Profile: No results for input(s): CHOL, HDL, LDLCALC, TRIG, CHOLHDL, LDLDIRECT in the last 72 hours. Thyroid Function Tests: No results for input(s): TSH, T4TOTAL, FREET4, T3FREE, THYROIDAB in the last 72 hours. Anemia Panel: No results for input(s): VITAMINB12, FOLATE, FERRITIN, TIBC, IRON, RETICCTPCT in the last 72 hours. Urine analysis:    Component Value Date/Time   COLORURINE YELLOW 03/30/2018 1751   APPEARANCEUR CLEAR 03/30/2018 1751   LABSPEC 1.021 03/30/2018 1751   PHURINE 5.0 03/30/2018 1751   GLUCOSEU NEGATIVE 03/30/2018 1751   GLUCOSEU >=1000 (A) 09/01/2015 1556   HGBUR NEGATIVE 03/30/2018 1751   BILIRUBINUR NEGATIVE 03/30/2018 1751   KETONESUR NEGATIVE 03/30/2018 1751   PROTEINUR NEGATIVE 03/30/2018 1751   UROBILINOGEN 0.2 09/01/2015 1556   NITRITE NEGATIVE 03/30/2018 1751   LEUKOCYTESUR NEGATIVE 03/30/2018 1751   Sepsis Labs: No results found for this or any previous visit (from the past 240 hour(s)).     Radiological Exams on Admission: Dg Chest 2 View  Result Date: 04/01/2018 CLINICAL DATA:  Short of breath EXAM: CHEST - 2 VIEW COMPARISON:  03/30/2018, 03/26/2018, 02/24/2018 FINDINGS: Left-sided central venous port tip overlies the distal SVC. Mild emphysematous disease. More confluent regions of airspace disease at the bilateral lung bases, lingula and right middle lobe. Tiny pleural effusions are suspected. Stable cardiomediastinal silhouette. No pneumothorax. IMPRESSION: 1. Small pleural effusions with bilateral lower lung airspace disease suspicious for pneumonia. Electronically Signed   By: Donavan Foil M.D.   On: 04/01/2018 22:53    EKG: Independently reviewed.  Sinus tachycardia 117 bpm  Assessment/Plan Suspect aspiration pneumonia, food bolus: Patient presented after getting choked on chicken while eating dinner.  Food bolus was able to be cleared without any intervention, but patient complains of cough.  Chest x-ray showing small bilateral pleural effusion with airspace disease concerning for pneumonia.  Patient was started on empiric antibiotics of Unasyn.  TRH called to admit. - Admit to a telemetry bed - Aspiration precautions - Mucinex - Continue Unasyn   Hypoglycemia with diabetes mellitus type 2: Patient was unable to eat his full dinner after getting choked.  Blood sugars found to be as low as 45 en route with EMS.  Patient had taken 100 units of NovoLog 70/30 this a.m.  - Hypoglycemic protocol  - Dextrose 5% IV fluids overnight as needed for persistent hypoglycemia - Decrease NovoLog 70/30 mix 70 units subcutaneous every morning  Dysphagia: Patient had received esophageal dilation from Dr. Henrene Pastor of gastroenterology previously in the past. - Soft diet  Macrocytic anemia: Hemoglobin noted to be 9.2 on admission.  Patient has been having complaints of epistaxis.  - Recheck CBC in a.m.  History of pulmonary embolism: Patient with history of pulmonary embolus in 07/2015  and pulmonary emboli noted from 02/2018. - Continue Xarelto  COPD, without acute exacerbation, oxygen dependent: Chronic.  Patient normally on 2 L nasal cannula oxygen at baseline. - Continue pulse oximetry overnight  with nasal cannula oxygen - Continue Breo - Duonebs as needed for shortness of breath/wheezing  Esophageal cancer s/p resection: Patient status post resection of GE junction adenocarcinoma.  Follows with Dr. [name] of oncology in the outpatient setting. - Continue outpatient follow-up   Anxiety and depression - Lexapro and Valium prn anxiety  GERD - Continue pharmacy substitution of Protonix  Thrombocytopenia: Chronic.  Platelet count noted to be 89 on admission. - Continue to monitor  DVT prophylaxis: Xarelto Code Status: Partial code (okay for pressors) Family Communication: Discussed plan of care with the patient family present at bedside Disposition Plan: Discharge home in 1 to 2 days Consults called: none Admission status: Observation  Norval Morton MD [facility] Pager [phone]   If 7PM-7AM, please contact night-coverage [email or web address] Password [email or web address]  04/02/2018, 12:08 AM

## 2018-04-03 LAB — TYPE AND SCREEN
ABO/RH(D): A POS
ANTIBODY SCREEN: NEGATIVE
Unit division: 0

## 2018-04-03 LAB — BPAM RBC
Blood Product Expiration Date: 201908312359
ISSUE DATE / TIME: 201908151326
Unit Type and Rh: 6200

## 2018-04-05 ENCOUNTER — Other Ambulatory Visit: Payer: Self-pay | Admitting: Oncology

## 2018-04-06 ENCOUNTER — Other Ambulatory Visit: Payer: Self-pay | Admitting: Oncology

## 2018-04-06 ENCOUNTER — Inpatient Hospital Stay (HOSPITAL_BASED_OUTPATIENT_CLINIC_OR_DEPARTMENT_OTHER): Payer: Medicare Other | Admitting: Oncology

## 2018-04-06 ENCOUNTER — Inpatient Hospital Stay: Payer: Medicare Other

## 2018-04-06 ENCOUNTER — Telehealth: Payer: Self-pay | Admitting: Oncology

## 2018-04-06 VITALS — BP 98/56 | HR 91 | Temp 98.0°F | Resp 14 | Ht 76.0 in | Wt 189.5 lb

## 2018-04-06 DIAGNOSIS — R42 Dizziness and giddiness: Secondary | ICD-10-CM | POA: Diagnosis not present

## 2018-04-06 DIAGNOSIS — I739 Peripheral vascular disease, unspecified: Secondary | ICD-10-CM

## 2018-04-06 DIAGNOSIS — C160 Malignant neoplasm of cardia: Secondary | ICD-10-CM | POA: Diagnosis not present

## 2018-04-06 DIAGNOSIS — R4702 Dysphasia: Secondary | ICD-10-CM

## 2018-04-06 DIAGNOSIS — E1165 Type 2 diabetes mellitus with hyperglycemia: Secondary | ICD-10-CM

## 2018-04-06 DIAGNOSIS — J449 Chronic obstructive pulmonary disease, unspecified: Secondary | ICD-10-CM

## 2018-04-06 DIAGNOSIS — C159 Malignant neoplasm of esophagus, unspecified: Secondary | ICD-10-CM

## 2018-04-06 DIAGNOSIS — I10 Essential (primary) hypertension: Secondary | ICD-10-CM | POA: Diagnosis not present

## 2018-04-06 DIAGNOSIS — E1121 Type 2 diabetes mellitus with diabetic nephropathy: Secondary | ICD-10-CM | POA: Diagnosis not present

## 2018-04-06 DIAGNOSIS — F329 Major depressive disorder, single episode, unspecified: Secondary | ICD-10-CM | POA: Diagnosis not present

## 2018-04-06 DIAGNOSIS — Z79899 Other long term (current) drug therapy: Secondary | ICD-10-CM

## 2018-04-06 DIAGNOSIS — D61810 Antineoplastic chemotherapy induced pancytopenia: Secondary | ICD-10-CM

## 2018-04-06 DIAGNOSIS — R110 Nausea: Secondary | ICD-10-CM | POA: Diagnosis not present

## 2018-04-06 DIAGNOSIS — Z95828 Presence of other vascular implants and grafts: Secondary | ICD-10-CM

## 2018-04-06 DIAGNOSIS — T451X5S Adverse effect of antineoplastic and immunosuppressive drugs, sequela: Secondary | ICD-10-CM

## 2018-04-06 DIAGNOSIS — K922 Gastrointestinal hemorrhage, unspecified: Secondary | ICD-10-CM | POA: Diagnosis not present

## 2018-04-06 DIAGNOSIS — Z5111 Encounter for antineoplastic chemotherapy: Secondary | ICD-10-CM | POA: Diagnosis not present

## 2018-04-06 DIAGNOSIS — Z86010 Personal history of colonic polyps: Secondary | ICD-10-CM

## 2018-04-06 DIAGNOSIS — C155 Malignant neoplasm of lower third of esophagus: Secondary | ICD-10-CM | POA: Diagnosis not present

## 2018-04-06 DIAGNOSIS — J90 Pleural effusion, not elsewhere classified: Secondary | ICD-10-CM

## 2018-04-06 DIAGNOSIS — I951 Orthostatic hypotension: Secondary | ICD-10-CM | POA: Diagnosis not present

## 2018-04-06 LAB — CBC WITH DIFFERENTIAL (CANCER CENTER ONLY)
BASOS ABS: 0 10*3/uL (ref 0.0–0.1)
Basophils Relative: 0 %
EOS PCT: 4 %
Eosinophils Absolute: 0.3 10*3/uL (ref 0.0–0.5)
HEMATOCRIT: 29.8 % — AB (ref 38.4–49.9)
Hemoglobin: 9.5 g/dL — ABNORMAL LOW (ref 13.0–17.1)
Lymphocytes Relative: 16 %
Lymphs Abs: 1.2 10*3/uL (ref 0.9–3.3)
MCH: 32.4 pg (ref 27.2–33.4)
MCHC: 31.9 g/dL — ABNORMAL LOW (ref 32.0–36.0)
MCV: 101.7 fL — ABNORMAL HIGH (ref 79.3–98.0)
MONO ABS: 0.5 10*3/uL (ref 0.1–0.9)
Monocytes Relative: 7 %
NEUTROS ABS: 5.7 10*3/uL (ref 1.5–6.5)
Neutrophils Relative %: 73 %
PLATELETS: 106 10*3/uL — AB (ref 140–400)
RBC: 2.93 MIL/uL — ABNORMAL LOW (ref 4.20–5.82)
RDW: 18.1 % — AB (ref 11.0–14.6)
WBC Count: 7.7 10*3/uL (ref 4.0–10.3)

## 2018-04-06 LAB — CMP (CANCER CENTER ONLY)
ALBUMIN: 2.7 g/dL — AB (ref 3.5–5.0)
ALK PHOS: 124 U/L (ref 38–126)
ALT: 8 U/L (ref 0–44)
ANION GAP: 9 (ref 5–15)
AST: 12 U/L — ABNORMAL LOW (ref 15–41)
BUN: 15 mg/dL (ref 8–23)
CALCIUM: 8.8 mg/dL — AB (ref 8.9–10.3)
CO2: 26 mmol/L (ref 22–32)
Chloride: 104 mmol/L (ref 98–111)
Creatinine: 0.94 mg/dL (ref 0.61–1.24)
GLUCOSE: 203 mg/dL — AB (ref 70–99)
Potassium: 3.9 mmol/L (ref 3.5–5.1)
Sodium: 139 mmol/L (ref 135–145)
TOTAL PROTEIN: 6.3 g/dL — AB (ref 6.5–8.1)
Total Bilirubin: 0.5 mg/dL (ref 0.3–1.2)

## 2018-04-06 LAB — SAMPLE TO BLOOD BANK

## 2018-04-06 LAB — CEA (IN HOUSE-CHCC): CEA (CHCC-In House): 9.5 ng/mL — ABNORMAL HIGH (ref 0.00–5.00)

## 2018-04-06 MED ORDER — SODIUM CHLORIDE 0.9% FLUSH
10.0000 mL | INTRAVENOUS | Status: DC | PRN
  Administered 2018-04-06: 10 mL
  Filled 2018-04-06: qty 10

## 2018-04-06 MED ORDER — SODIUM CHLORIDE 0.9% FLUSH
10.0000 mL | INTRAVENOUS | Status: DC | PRN
Start: 2018-04-06 — End: 2018-04-06
  Administered 2018-04-06: 10 mL
  Filled 2018-04-06: qty 10

## 2018-04-06 MED ORDER — HEPARIN SOD (PORK) LOCK FLUSH 100 UNIT/ML IV SOLN
500.0000 [IU] | Freq: Once | INTRAVENOUS | Status: AC | PRN
  Administered 2018-04-06: 500 [IU]
  Filled 2018-04-06: qty 5

## 2018-04-06 NOTE — Progress Notes (Signed)
Ronald Johnson   Diagnosis: Esophagus cancer  INTERVAL HISTORY:   Ronald Johnson returns for a scheduled visit.  He completed another cycle of FOLFOX beginning 03/23/2018.  No mouth sores or diarrhea.  He reports increased nausea, relieved with Compazine.  He has noted numbness in the hands.  This does not interfere with activity.  His swallowing continues to be better, but he has limited oral intake. He was referred to the emergency room on 03/30/2018 after he was noted to have hypotension at physical therapy.  He was transfused with packed red blood cells.  The hypotension improved and he was discharged home. He presented to the emergency room again on 04/01/2018.  He reports "chicken" became stuck in his esophagus, but this resolved spontaneously.  When EMS arrived he was noted to have hypoglycemia.  He was transported to the emergency room.  A chest x-ray showed small effusions and suspicion for airspace disease.  He was treated for pneumonia.  He was transfused with another unit of packed red blood cells after the hemoglobin returned at 7.7.  Ronald Johnson denies bleeding other than mild nosebleeding and "hemorrhoid "bleeding.  No dark stools.   He underwent a right thoracentesis on 03/26/2018 for 1.7 L the fluid returned "suspicious "for adenocarcinoma.. Objective:  Vital signs in last 24 hours:  Blood pressure (!) 98/56, pulse 91, temperature 98 F (36.7 C), temperature source Oral, resp. rate 14, height 6\' 4"  (1.93 m), weight 189 lb 8 oz (86 kg), SpO2 95 %.    HEENT: No thrush or ulcers Lymphatics: No cervical or supraclavicular nodes Resp: Distant breath sounds, diminished at the bases, no respiratory distress Cardio: Regular rate and rhythm GI: Nontender, no hepatosplenomegaly Vascular: No leg edema Neuro: The vibratory sense is intact at the fingertips bilaterally  Portacath/PICC-without erythema  Lab Results:  Lab Results  Component Value Date   WBC 7.7 04/06/2018   HGB 9.5 (L) 04/06/2018   HCT 29.8 (L) 04/06/2018   MCV 101.7 (H) 04/06/2018   PLT 106 (L) 04/06/2018   NEUTROABS 5.7 04/06/2018    CMP  Lab Results  Component Value Date   NA 139 04/06/2018   K 3.9 04/06/2018   CL 104 04/06/2018   CO2 26 04/06/2018   GLUCOSE 203 (H) 04/06/2018   BUN 15 04/06/2018   CREATININE 0.94 04/06/2018   CALCIUM 8.8 (L) 04/06/2018   PROT 6.3 (L) 04/06/2018   ALBUMIN 2.7 (L) 04/06/2018   AST 12 (L) 04/06/2018   ALT 8 04/06/2018   ALKPHOS 124 04/06/2018   BILITOT 0.5 04/06/2018   GFRNONAA >60 04/06/2018   GFRAA >60 04/06/2018    Lab Results  Component Value Date   CEA1 12.15 (H) 03/23/2018    Medications: I have reviewed the patient's current medications.   Assessment/Plan: 1. 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 EUS 03/16/2015 confirmed a clinical stage IIb (uT3,uN1) tumor ? Staging CTs of the chest, abdomen, and pelvis with no evidence of metastatic disease ? Initiation of radiation 04/19/2015, cycle 1 Taxol/carboplatin 04/20/2015: Radiation completed 05/29/2015; the fifth and final week of Taxol/carboplatin 05/18/2015 ? Restaging CTs 07/20/2015 revealed no evidence of metastatic disease, incidental left lower lobe pulmonary embolism ? Transhiatal total esophagectomy 07/25/2015 confirmed a ypT3, ypN2 with 4/5 positive lymph nodes, lymphovascular invasion, perineural invasion, negative resection margins ? PD1 score-0 ? CT neck 07/25/2017-4-5 cm mass in the right lower neck upper mediastinum immediately adjacent to the esophageal anastomosis. 12 mm mass  within the right parotid enlarged since the previous PET study where it was about half that size. Question few small pulmonary nodules. Borderline enlarged mediastinal lymph nodes. ? CTs brain/chest/abdomen/pelvis 08/06/2017-ill-defined mass at the right thoracic inlet extending into the right superior mediastinum; new bilateral but  right much greater than left peripheral reticulonodular and groundglass opacities; scattered small paratracheal and anterior mediastinal lymph nodes; small bilateral axillary lymph nodes. Brain CT with no evidence of metastasis. ? 08/26/2017 biopsy right supraclavicular lymph node-very scant fragment of benign lymph node. ? PET scan 09/11/2017-poorly marginated heterogeneously hypermetabolic soft tissue mass at the right thoracic inlet extending laterally from the esophagogastric anastomosis in the neck. No definite hypermetabolic metastatic disease.Patchy tree-in-bud opacities throughout the lungs significantly decreased. Persistent scattered subcentimeter solid pulmonary nodules in both lungs stable to decreased. Stable irregular 1.7 cm nodular bandlike opacity anterior left lower lobe with associated low-level metabolism. Nonspecific mildly hypermetabolic subcarinal and bilateral hilar lymph nodes. Nonspecific mildly hypermetabolic nodular focus of soft tissue in the upper left retroperitoneum anterior to the adrenal gland. ? Upper endoscopy 09/15/2017-at 20 cm from the incisors tight stricture at the anastomosis measuring only a few millimeters in diameter. Surrounding tissues firm, slightly friable, noncompliant. Stricture was dilated with a 15 mm balloon several times. Diameter of stricture improved but still would not permit passage of a standard upper endoscope. Friable areas biopsied-invasive adenocarcinoma. ? Initiation of radiation and concurrent capecitabine 2/4/2019completed 10/30/2017 ? CTs neck and chest 12/23/2017-stable abnormal soft tissue at the right thoracic inlet with encasement of the proximal right common carotid artery, interval development of sclerotic lesions at C7 and T1, new indeterminate right lung opacities ? Cycle 1 FOLFOX 01/08/2018 ? Cycle 2 FOLFOX 02/02/2018 (Oxaliplatin dose reduced due to neutropenia) ? Cycle 3 FOLFOX 02/16/2018, white cell growth factor support  added ? CT 02/24/2018-progressive loculated right effusion, stable lung nodules, stable thoracic inlet soft tissue, small peripheral pulmonary emboli ? Cycle 4 FOLFOX 03/09/2018 ? Cycle 5 FOLFOX 03/23/2018  2. Postprandial subxiphoid pain secondary to #1  3. Diabetes-elevated blood sugar readings at the [facility] and at home  4. COPD  5. Depression-Improved with Wellbutrin  6. Hypertension  7. History of adenomatous colon polyps  8. History of diabetic related neuropathy, peripheral arterial disease, and nephropathy  9. History of nausea-likely related to radiation esophagitis/gastritis  10. Incidental pulmonary embolism noted on the CT 07/20/2015, treated with Xarelto  11. Admission 08/18/2015 with GI bleeding while on Xarelto, anticoagulation therapy discontinued  12. Dysphagia-improved with esophageal dilatation procedures by Dr. Henrene Pastor, progressive dysphagia secondary to local recurrence of esophagus cancer with obstruction at the esophagus anastomosis  13. Pancytopenia secondary to chemotherapy  14. CT scan abdomen/pelvis 10/09/2016 (done to evaluate persistent nausea, epigastric pain)-no acute findings or explanation for the patient's symptoms. Resolving postsurgical changes from presumed previous esophagectomy and gastric pull-through. No evidence of metastatic disease.  15. Admission 02/24/2018 with sepsis syndrome- probable pulmonary source for infection with a right effusion,cultures negative   16. Incidental pulmonary emboli noted on CT 02/24/2018, Xarelto started 03/02/2018  17.  Right pleural effusion, status post thoracentesis 03/20/2018-"suspicious "for adenocarcinoma  18.  Severe anemia-status post transfusion with packed red blood cells 03/30/2018, 03/31/2018, and 04/02/2018    Disposition: Ronald Johnson has completed 5 treatments with FOLFOX.  He has tolerated the chemotherapy well.  He was recently admitted with hypotension and severe  anemia.  The anemia is likely secondary to chemotherapy, malnutrition, and chronic disease.  There may be a component related to bleeding.  The hemoglobin is improved today.  The CEA was lower on 03/23/2018 and he continues to have improvement in dysphasia.  However he is losing weight and has a probable malignant right effusion.  We decided to place FOLFOX on hold.  Ronald Johnson will return for an office visit with the plan to continue chemotherapy in 2 weeks.  He will contact us in the interim for new symptoms.  25 minutes were spent with the patient today.  The majority of the time was used for counseling and coordination of care.  Betsy Coder, MD  04/06/2018  9:06 AM

## 2018-04-06 NOTE — Telephone Encounter (Signed)
Appts scheduled AVS/Calendar printed per 8/19 los °

## 2018-04-07 ENCOUNTER — Ambulatory Visit: Payer: Medicare Other | Admitting: Physical Therapy

## 2018-04-07 ENCOUNTER — Ambulatory Visit: Payer: Medicare Other | Admitting: Occupational Therapy

## 2018-04-09 ENCOUNTER — Telehealth: Payer: Self-pay | Admitting: Oncology

## 2018-04-09 ENCOUNTER — Telehealth: Payer: Self-pay

## 2018-04-09 ENCOUNTER — Ambulatory Visit: Payer: Medicare Other | Admitting: Physical Therapy

## 2018-04-09 ENCOUNTER — Other Ambulatory Visit: Payer: Self-pay | Admitting: Internal Medicine

## 2018-04-09 ENCOUNTER — Ambulatory Visit: Payer: Medicare Other | Admitting: Occupational Therapy

## 2018-04-09 ENCOUNTER — Inpatient Hospital Stay (HOSPITAL_BASED_OUTPATIENT_CLINIC_OR_DEPARTMENT_OTHER): Payer: Medicare Other | Admitting: Medical

## 2018-04-09 ENCOUNTER — Inpatient Hospital Stay: Payer: Medicare Other

## 2018-04-09 VITALS — BP 109/52 | HR 61 | Temp 98.5°F | Resp 18 | Ht 76.0 in | Wt 185.7 lb

## 2018-04-09 DIAGNOSIS — K922 Gastrointestinal hemorrhage, unspecified: Secondary | ICD-10-CM | POA: Diagnosis not present

## 2018-04-09 DIAGNOSIS — J90 Pleural effusion, not elsewhere classified: Secondary | ICD-10-CM

## 2018-04-09 DIAGNOSIS — K222 Esophageal obstruction: Secondary | ICD-10-CM

## 2018-04-09 DIAGNOSIS — I739 Peripheral vascular disease, unspecified: Secondary | ICD-10-CM

## 2018-04-09 DIAGNOSIS — Z95828 Presence of other vascular implants and grafts: Secondary | ICD-10-CM

## 2018-04-09 DIAGNOSIS — R4702 Dysphasia: Secondary | ICD-10-CM | POA: Diagnosis not present

## 2018-04-09 DIAGNOSIS — R42 Dizziness and giddiness: Secondary | ICD-10-CM | POA: Diagnosis not present

## 2018-04-09 DIAGNOSIS — C159 Malignant neoplasm of esophagus, unspecified: Secondary | ICD-10-CM

## 2018-04-09 DIAGNOSIS — I951 Orthostatic hypotension: Secondary | ICD-10-CM | POA: Diagnosis not present

## 2018-04-09 DIAGNOSIS — I10 Essential (primary) hypertension: Secondary | ICD-10-CM

## 2018-04-09 DIAGNOSIS — J449 Chronic obstructive pulmonary disease, unspecified: Secondary | ICD-10-CM | POA: Diagnosis not present

## 2018-04-09 DIAGNOSIS — E119 Type 2 diabetes mellitus without complications: Secondary | ICD-10-CM

## 2018-04-09 DIAGNOSIS — R110 Nausea: Secondary | ICD-10-CM

## 2018-04-09 DIAGNOSIS — C155 Malignant neoplasm of lower third of esophagus: Secondary | ICD-10-CM | POA: Diagnosis not present

## 2018-04-09 DIAGNOSIS — M1990 Unspecified osteoarthritis, unspecified site: Secondary | ICD-10-CM | POA: Diagnosis not present

## 2018-04-09 DIAGNOSIS — C160 Malignant neoplasm of cardia: Secondary | ICD-10-CM | POA: Diagnosis not present

## 2018-04-09 DIAGNOSIS — R1310 Dysphagia, unspecified: Secondary | ICD-10-CM

## 2018-04-09 DIAGNOSIS — Z5111 Encounter for antineoplastic chemotherapy: Secondary | ICD-10-CM | POA: Diagnosis not present

## 2018-04-09 DIAGNOSIS — R093 Abnormal sputum: Secondary | ICD-10-CM

## 2018-04-09 DIAGNOSIS — Z87891 Personal history of nicotine dependence: Secondary | ICD-10-CM

## 2018-04-09 DIAGNOSIS — F329 Major depressive disorder, single episode, unspecified: Secondary | ICD-10-CM

## 2018-04-09 DIAGNOSIS — Z79899 Other long term (current) drug therapy: Secondary | ICD-10-CM

## 2018-04-09 LAB — CMP (CANCER CENTER ONLY)
ALT: 7 U/L (ref 0–44)
AST: 13 U/L — ABNORMAL LOW (ref 15–41)
Albumin: 3 g/dL — ABNORMAL LOW (ref 3.5–5.0)
Alkaline Phosphatase: 116 U/L (ref 38–126)
Anion gap: 8 (ref 5–15)
BUN: 18 mg/dL (ref 8–23)
CHLORIDE: 105 mmol/L (ref 98–111)
CO2: 26 mmol/L (ref 22–32)
Calcium: 9.2 mg/dL (ref 8.9–10.3)
Creatinine: 1.12 mg/dL (ref 0.61–1.24)
GFR, Estimated: >60 mL/min (ref >60)
Glucose, Bld: 241 mg/dL — ABNORMAL HIGH (ref 70–99)
POTASSIUM: 4.4 mmol/L (ref 3.5–5.1)
SODIUM: 139 mmol/L (ref 135–145)
Total Bilirubin: 0.9 mg/dL (ref 0.3–1.2)
Total Protein: 6.8 g/dL (ref 6.5–8.1)

## 2018-04-09 LAB — CBC WITH DIFFERENTIAL (CANCER CENTER ONLY)
Basophils Absolute: 0 10*3/uL (ref 0.0–0.1)
Basophils Relative: 0 %
Eosinophils Absolute: 0.3 10*3/uL (ref 0.0–0.5)
Eosinophils Relative: 3 %
HEMATOCRIT: 28.7 % — AB (ref 38.4–49.9)
HEMOGLOBIN: 8.9 g/dL — AB (ref 13.0–17.1)
LYMPHS PCT: 14 %
Lymphs Abs: 1.5 10*3/uL (ref 0.9–3.3)
MCH: 32.2 pg (ref 27.2–33.4)
MCHC: 31 g/dL — AB (ref 32.0–36.0)
MCV: 104 fL — ABNORMAL HIGH (ref 79.3–98.0)
MONOS PCT: 6 %
Monocytes Absolute: 0.7 10*3/uL (ref 0.1–0.9)
NEUTROS ABS: 8.5 10*3/uL — AB (ref 1.5–6.5)
NEUTROS PCT: 77 %
Platelet Count: 187 10*3/uL (ref 140–400)
RBC: 2.76 MIL/uL — AB (ref 4.20–5.82)
RDW: 19 % — ABNORMAL HIGH (ref 11.0–14.6)
WBC Count: 11 10*3/uL — ABNORMAL HIGH (ref 4.0–10.3)

## 2018-04-09 LAB — CORTISOL: Cortisol, Plasma: 13 ug/dL

## 2018-04-09 LAB — OCCULT BLOOD X 1 CARD TO LAB, STOOL: Fecal Occult Bld: POSITIVE — AB

## 2018-04-09 MED ORDER — SODIUM CHLORIDE 0.9% FLUSH
10.0000 mL | INTRAVENOUS | Status: DC | PRN
  Administered 2018-04-09: 10 mL
  Filled 2018-04-09: qty 10

## 2018-04-09 MED ORDER — SODIUM CHLORIDE 0.9 % IV SOLN
Freq: Once | INTRAVENOUS | Status: AC
  Administered 2018-04-09: 13:00:00 via INTRAVENOUS
  Filled 2018-04-09: qty 250

## 2018-04-09 MED ORDER — HEPARIN SOD (PORK) LOCK FLUSH 100 UNIT/ML IV SOLN
500.0000 [IU] | Freq: Once | INTRAVENOUS | Status: AC | PRN
  Administered 2018-04-09: 500 [IU]
  Filled 2018-04-09: qty 5

## 2018-04-09 NOTE — Patient Instructions (Signed)
Dehydration, Adult Dehydration is when there is not enough fluid or water in your body. This happens when you lose more fluids than you take in. Dehydration can range from mild to very bad. It should be treated right away to keep it from getting very bad. Symptoms of mild dehydration may include:  Thirst.  Dry lips.  Slightly dry mouth.  Dry, warm skin.  Dizziness. Symptoms of moderate dehydration may include:  Very dry mouth.  Muscle cramps.  Dark pee (urine). Pee may be the color of tea.  Your body making less pee.  Your eyes making fewer tears.  Heartbeat that is uneven or faster than normal (palpitations).  Headache.  Light-headedness, especially when you stand up from sitting.  Fainting (syncope). Symptoms of very bad dehydration may include:  Changes in skin, such as: ? Cold and clammy skin. ? Blotchy (mottled) or pale skin. ? Skin that does not quickly return to normal after being lightly pinched and let go (poor skin turgor).  Changes in body fluids, such as: ? Feeling very thirsty. ? Your eyes making fewer tears. ? Not sweating when body temperature is high, such as in hot weather. ? Your body making very little pee.  Changes in vital signs, such as: ? Weak pulse. ? Pulse that is more than 100 beats a minute when you are sitting still. ? Fast breathing. ? Low blood pressure.  Other changes, such as: ? Sunken eyes. ? Cold hands and feet. ? Confusion. ? Lack of energy (lethargy). ? Trouble waking up from sleep. ? Short-term weight loss. ? Unconsciousness. Follow these instructions at home:  If told by your doctor, drink an ORS: ? Make an ORS by using instructions on the package. ? Start by drinking small amounts, about  cup (120 mL) every 5-10 minutes. ? Slowly drink more until you have had the amount that your doctor said to have.  Drink enough clear fluid to keep your pee clear or pale yellow. If you were told to drink an ORS, finish the ORS  first, then start slowly drinking clear fluids. Drink fluids such as: ? Water. Do not drink only water by itself. Doing that can make the salt (sodium) level in your body get too low (hyponatremia). ? Ice chips. ? Fruit juice that you have added water to (diluted). ? Low-calorie sports drinks.  Avoid: ? Alcohol. ? Drinks that have a lot of sugar. These include high-calorie sports drinks, fruit juice that does not have water added, and soda. ? Caffeine. ? Foods that are greasy or have a lot of fat or sugar.  Take over-the-counter and prescription medicines only as told by your doctor.  Do not take salt tablets. Doing that can make the salt level in your body get too high (hypernatremia).  Eat foods that have minerals (electrolytes). Examples include bananas, oranges, potatoes, tomatoes, and spinach.  Keep all follow-up visits as told by your doctor. This is important. Contact a doctor if:  You have belly (abdominal) pain that: ? Gets worse. ? Stays in one area (localizes).  You have a rash.  You have a stiff neck.  You get angry or annoyed more easily than normal (irritability).  You are more sleepy than normal.  You have a harder time waking up than normal.  You feel: ? Weak. ? Dizzy. ? Very thirsty.  You have peed (urinated) only a small amount of very dark pee during 6-8 hours. Get help right away if:  You have symptoms of   very bad dehydration.  You cannot drink fluids without throwing up (vomiting).  Your symptoms get worse with treatment.  You have a fever.  You have a very bad headache.  You are throwing up or having watery poop (diarrhea) and it: ? Gets worse. ? Does not go away.  You have blood or something green (bile) in your throw-up.  You have blood in your poop (stool). This may cause poop to look black and tarry.  You have not peed in 6-8 hours.  You pass out (faint).  Your heart rate when you are sitting still is more than 100 beats a  minute.  You have trouble breathing. This information is not intended to replace advice given to you by your health care provider. Make sure you discuss any questions you have with your health care provider. Document Released: 06/01/2009 Document Revised: 02/23/2016 Document Reviewed: 09/29/2015 Elsevier Interactive Patient Education  2018 Elsevier Inc.  

## 2018-04-09 NOTE — Progress Notes (Signed)
Pt seen by PA Van only.  No RN assessment at this time.  PA aware. 

## 2018-04-09 NOTE — Progress Notes (Signed)
[facility]pt[facility] Management Clinic Progress Note   Ronald Johnson [id number] [birth year] 72 y.o.  Ronald Johnson is managed by Dr. Dominica Severin B. Sherrill  Actively treated with chemotherapy/immunotherapy: yes  Current Therapy: FOLFOX  Last Treated:  04/06/2018  Assessment: Plan:    Orthostatic hypotension - Plan: 0.9 %  sodium chloride infusion, Cortisol, CBC with Differential ([place] Only)  Gastrointestinal hemorrhage, unspecified gastrointestinal hemorrhage type - Plan: Occult blood card to lab, stool, CBC with Differential ([place])  Port-A-Cath in place - Plan: heparin lock flush 100 unit/mL, sodium chloride flush (NS) 0.9 % injection 10 mL  Malignant neoplasm of lower third of esophagus (HCC)   Orthostatic hypotension: Patient was given 1 L of normal saline with his blood pressure improving.  A cortisol level was collected today.  He has been instructed to begin using thigh-high support stockings.  He will return to clinic on Monday or Tuesday of next week for a follow-up.  Gastrointestinal hemorrhage: A stool Hemoccult returned positive today for blood.  The patient was instructed to hold Xarelto.  He will return for follow-up on Monday or Tuesday of next week.  A CBC returned today with a hemoglobin of 8.9.  This is slightly lower than when last checked 4 days ago when his hemoglobin returned at 9.5.  He reports having 3 units of blood transfused while in the ER last week.  He is continuing to have dark stools.  Malignant neoplasm of the lower third of the esophagus:.  The patient is status post dosing with FOLFOX on 04/06/2018.  He continues in the care of Dr. Dominica Severin B. Sherrill.  Please see After Visit Summary for patient specific instructions.  Future Appointments  Date Time Provider [place]  04/13/2018 10:15 AM Hulda Marin, OT [facility]  04/13/2018 11:00 AM Dilday, Vinnie Level, PT [facility]  04/16/2018  1:15 PM Carey Bullocks, OT [facility]  [facility]  04/16/2018  2:00 PM Drema Balzarine, PTA [facility]  04/21/2018 10:15 AM Guido Sander, PT [facility]  04/21/2018 11:00 AM Rine, Selmer Dominion, OT [facility]  04/22/2018  8:30 AM [facility] LAB 1 [facility] None  04/22/2018  8:45 AM [facility] [place] FLUSH [facility] None  04/22/2018  9:15 AM Owens Shark, NP [facility] None  04/22/2018 11:00 AM [facility] INFUSION [facility] None  04/23/2018  8:15 AM Tat, Eustace Quail, DO [facility] None  04/23/2018  1:15 PM Dilday, Vinnie Level, PT [facility]  04/23/2018  2:00 PM Rine, Selmer Dominion, OT [facility]  04/24/2018  2:00 PM [facility] [place] FLUSH [facility] None  04/28/2018  9:30 AM Rine, Selmer Dominion, OT [facility]  04/28/2018 10:15 AM Guido Sander, PT [facility]  04/30/2018  9:30 AM Rine, Selmer Dominion, OT [facility]  04/30/2018 10:15 AM Guido Sander, PT [facility]  05/07/2018  8:30 AM [facility] LAB 3 [facility] None  05/07/2018  8:45 AM [facility] [place] FLUSH [facility] None  05/07/2018  9:15 AM Owens Shark, NP [facility] None  05/07/2018 10:15 AM [facility] INFUSION [facility] None  05/09/2018  1:45 PM [facility] [place] FLUSH [facility] None  05-26-2018 10:15 AM Rigoberto Noel, MD [facility] None  05/19/2018 10:30 AM [name], MD [facility] None  05/20/2018  9:00 AM [facility] LAB 4 [facility] None  05/20/2018  9:15 AM [facility] [place] FLUSH [facility] None  05/20/2018  9:45 AM Owens Shark, NP [facility] None  05/20/2018 11:15 AM [facility] INFUSION [facility] None  05/22/2018  3:15 PM [facility] [place] FLUSH [facility] None  Orders Placed This Encounter  Procedures  . Cortisol  . Occult blood card to lab, stool  . CBC with Differential ([facility] Only)       Subjective:   Patient ID:  Ronald Johnson is a 72 y.o. (DOB [birth year]) male.  Chief Complaint: No chief complaint on file.   HPI Ronald Johnson is a 72 year old male with a history of a metastatic esophageal cancer who is managed by Dr. Dominica Severin B. Sherrill and is  status post dosing with FOLFOX on 04/06/2018.  He was seen in the emergency room last week and was given 2 units of packed red blood cells.  He returned on Wednesday and received an additional unit of packed red blood cells.  He has had dark stools without bright red blood per rectum.  He continues on Xarelto for PEs which were incidentally found on a CT scan.  The patient is having episodes of positional dizziness.  He was given a prescription for Augmentin last week while in the emergency room.  He is completed 7 days of therapy and will complete 10 days of therapy on this coming Sunday.  He has had diarrhea 3-4 times per day.  He is eating yogurt.  He continues to have a cough with gray-green sputum.  He is having nausea with no vomiting.  He denies fevers, chills, or sweats.  Medications: I have reviewed the patient's current medications.  Allergies: No Known Allergies  Past Medical History:  Diagnosis Date  . Anemia 08/15/2009  . Anxiety   . Arthritis   . [name]'s esophagus 2007  . COLONIC POLYPS, ADENOMATOUS 08/01/2008, 2013, 2014  . COPD (chronic obstructive pulmonary disease) (HCC)    no per pt  . Depression   . Diabetes type 2, controlled ([place]) 03/13/2007  . Diverticulitis   . ED (erectile dysfunction)   . EMPHYSEMA 11/08/2008   no per pt  . Esophageal cancer ([place]) "dx'd ~ 01/2015"  . GERD 03/13/2007  . GIB (gastrointestinal bleeding) 08/18/2015  . Hiatal hernia   . History of blood transfusion 08/18/2015   due to GIB  . HYPERCHOLESTEROLEMIA 02/03/2008  . HYPERTENSION 03/13/2007   pt denies, claims white coat syndrome  . Pneumonia   . Prostatism   . PULMONARY NODULE 11/24/2008  . Stomach cancer ([place]) "dx'd ~ 01/2015"  . TOBACCO ABUSE 11/24/2008    Past Surgical History:  Procedure Laterality Date  . BALLOON DILATION N/A 03/19/2016   Procedure: BALLOON DILATION;  Surgeon: Irene Shipper, MD;  Location: [facility];  Service: Endoscopy;  Laterality: N/A;  . CIRCUMCISION    .  COLONOSCOPY    . COMPLETE ESOPHAGECTOMY N/A 07/25/2015   Procedure: TRANSHIATAL TOTAL ESOPHAGECTOMY COMPLETE PYLOROMYOTOMY;  Surgeon: [name], MD;  Location: [facility];  Service: Thoracic;  Laterality: N/A;  . EGD  11/19/2005  . ESOPHAGOGASTRODUODENOSCOPY N/A 03/19/2016   Procedure: ESOPHAGOGASTRODUODENOSCOPY (EGD);  Surgeon: Irene Shipper, MD;  Location: [facility];  Service: Endoscopy;  Laterality: N/A;  . ESOPHAGOGASTRODUODENOSCOPY (EGD) WITH PROPOFOL N/A 09/15/2017   Procedure: ESOPHAGOGASTRODUODENOSCOPY (EGD) WITH PROPOFOL;  Surgeon: Irene Shipper, MD;  Location: [facility];  Service: Endoscopy;  Laterality: N/A;  . EUS N/A 03/16/2015   Procedure: UPPER ENDOSCOPIC ULTRASOUND (EUS) RADIAL;  Surgeon: Milus Banister, MD;  Location: [facility];  Service: Endoscopy;  Laterality: N/A;  . FOOT FRACTURE SURGERY Right 1980   right foot w/ pins and screws  . IR IMAGING GUIDED PORT INSERTION  01/05/2018  . IR US  GUIDE VASC ACCESS LEFT  01/05/2018  . JEJUNOSTOMY N/A 07/25/2015   Procedure: FEEDING JEJUNOSTOMY;  Surgeon: [name], MD;  Location: [facility];  Service: Thoracic;  Laterality: N/A;  . MICROLARYNGOSCOPY W/VOCAL CORD INJECTION N/A 01/22/2018   Procedure: MICROLARYNGOSCOPY WITH VOCAL CORD INJECTION;  Surgeon: Melissa Montane, MD;  Location: [place];  Service: ENT;  Laterality: N/A;  . POLYPECTOMY    . removal pins and screws foot  1980   right foot  . UPPER GASTROINTESTINAL ENDOSCOPY    . VASECTOMY    . VIDEO BRONCHOSCOPY N/A 07/25/2015   Procedure: VIDEO BRONCHOSCOPY;  Surgeon: [name], MD;  [facility];  Service: Thoracic;  Laterality: N/A;    Family History  Problem Relation Age of Onset  . Stroke Mother   . Diabetes Mother   . Diabetes Maternal Grandmother        mother side of the family aunts, MGF  . Breast cancer Paternal Grandmother   . AAA (abdominal aortic aneurysm) Father   . Colon cancer Neg Hx   . Esophageal cancer Neg Hx   . Rectal cancer Neg Hx   .  Stomach cancer Neg Hx     Social History   Socioeconomic History  . Marital status: Married    Spouse name: Not on file  . Number of children: 2  . Years of education: Not on file  . Highest education level: Not on file  Occupational History  . Occupation: retired    Fish farm manager: [employer]    Comment: works Education administrator  . Financial resource strain: Not on file  . Food insecurity:    Worry: Not on file    Inability: Not on file  . Transportation needs:    Medical: Not on file    Non-medical: Not on file  Tobacco Use  . Smoking status: Former Smoker    Packs/day: 1.00    Years: 35.00    Pack years: 35.00    Types: Cigarettes    Last attempt to quit: 10/31/2008    Years since quitting: 9.4  . Smokeless tobacco: Never Used  Substance and Sexual Activity  . Alcohol use: No    Alcohol/week: 0.0 standard drinks  . Drug use: No  . Sexual activity: Not Currently    Partners: Female  Lifestyle  . Physical activity:    Days per week: Not on file    Minutes per session: Not on file  . Stress: Not on file  Relationships  . Social connections:    Talks on phone: Not on file    Gets together: Not on file    Attends religious service: Not on file    Active member of club or organization: Not on file    Attends meetings of clubs or organizations: Not on file    Relationship status: Not on file  . Intimate partner violence:    Fear of current or ex partner: Not on file    Emotionally abused: Not on file    Physically abused: Not on file    Forced sexual activity: Not on file  Other Topics Concern  . 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  Please see review of systems  for further details on the patient's review from today.   Review of Systems:  Review of Systems  Constitutional: Positive for appetite change. Negative for chills, diaphoresis and fever.  HENT: Negative for dental problem, mouth sores and trouble swallowing.   Respiratory: Positive for cough. Negative for chest tightness and shortness of breath.   Cardiovascular: Negative for chest pain and palpitations.  Gastrointestinal: Positive for blood in stool and nausea. Negative for constipation, diarrhea and vomiting.       Melena  Neurological: Positive for dizziness. Negative for syncope, weakness and headaches.    Objective:   Physical Exam:  BP (!) 109/52   Pulse 61   Temp 98.5 F (36.9 C) (Oral)   Resp 18   Ht '6\' 4"'$  (1.93 m)   Wt 185 lb 11.2 oz (84.2 kg)   SpO2 97%   BMI 22.60 kg/m  ECOG: 1  Orthostatic Blood Pressure: Blood pressure:   sitting 76/53, standing 51/33 Pulse:   sitting 80, standing 94   Physical Exam  Constitutional: No distress.  HENT:  Head: Normocephalic and atraumatic.  Eyes:  Conjunctiva is pale  Cardiovascular: Normal rate, regular rhythm and normal heart sounds. Exam reveals no gallop and no friction rub.  No murmur heard. Pulmonary/Chest: Effort normal and breath sounds normal. No respiratory distress. He has no wheezes. He has no rales.  Abdominal: Soft. Bowel sounds are normal. He exhibits no distension and no mass. There is no tenderness. There is no rebound and no guarding.  Genitourinary: Rectal exam shows guaiac positive stool.  Neurological: He is alert.  Skin: Skin is warm and dry. No rash noted. He is not diaphoretic. No erythema. There is pallor.  Psychiatric: He has a normal mood and affect. His behavior is normal. Judgment and thought content normal.    Lab Review:     Component Value Date/Time   NA 139 04/09/2018 1037   NA 138 08/05/2017 1547   K 4.4 04/09/2018 1037   K 4.0 08/05/2017 1547   CL 105 04/09/2018 1037   CO2 26  04/09/2018 1037   CO2 27 08/05/2017 1547   GLUCOSE 241 (H) 04/09/2018 1037   GLUCOSE 184 (H) 08/05/2017 1547   GLUCOSE 129 (H) 06/30/2006 0809   BUN 18 04/09/2018 1037   BUN 18.3 08/05/2017 1547   CREATININE 1.12 04/09/2018 1037   CREATININE 1.3 08/05/2017 1547   CALCIUM 9.2 04/09/2018 1037   CALCIUM 9.5 08/05/2017 1547   PROT 6.8 04/09/2018 1037   PROT 8.0 08/05/2017 1547   ALBUMIN 3.0 (L) 04/09/2018 1037   ALBUMIN 3.6 08/05/2017 1547   AST 13 (L) 04/09/2018 1037   AST 28 08/05/2017 1547   ALT 7 04/09/2018 1037   ALT 27 08/05/2017 1547   ALKPHOS 116 04/09/2018 1037   ALKPHOS 95 08/05/2017 1547   BILITOT 0.9 04/09/2018 1037   BILITOT 0.50 08/05/2017 1547   GFRNONAA >60 04/09/2018 1037   GFRAA >60 04/09/2018 1037       Component Value Date/Time   WBC 11.0 (H) 04/09/2018 1037   WBC 4.3 04/02/2018 0446   RBC 2.76 (L) 04/09/2018 1037   HGB 8.9 (L) 04/09/2018 1037   HGB 12.9 (L) 08/05/2017 1547   HCT 28.7 (L) 04/09/2018 1037   HCT 39.2 08/05/2017 1547   PLT 187 04/09/2018 1037   PLT 160 08/05/2017 1547   MCV 104.0 (H) 04/09/2018 1037   MCV 96.6 08/05/2017 1547   MCH 32.2 04/09/2018 1037  MCHC 31.0 (L) 04/09/2018 1037   RDW 19.0 (H) 04/09/2018 1037   RDW 13.2 08/05/2017 1547   LYMPHSABS 1.5 04/09/2018 1037   LYMPHSABS 2.5 08/05/2017 1547   MONOABS 0.7 04/09/2018 1037   MONOABS 0.6 08/05/2017 1547   EOSABS 0.3 04/09/2018 1037   EOSABS 0.4 08/05/2017 1547   BASOSABS 0.0 04/09/2018 1037   BASOSABS 0.0 08/05/2017 1547   -------------------------------  Imaging from last 24 hours (if applicable):  Radiology interpretation: Dg Chest 1 View  Result Date: 03/26/2018 CLINICAL DATA:  72 year old male status post right thoracentesis EXAM: CHEST  1 VIEW COMPARISON:  03/20/2018 FINDINGS: Cardiomediastinal silhouette unchanged in size and contour. Decreased fluid at the right lung base with no pneumothorax. Blunting of the bilateral costophrenic angles persists. Unchanged left  IJ approach port catheter. Coarsened interstitial markings bilaterally with similar appearance of patchy opacity. IMPRESSION: No complicating features status post right thoracentesis. Likely trace pleural fluid bilaterally. Electronically Signed   By: Corrie Mckusick D.O.   On: 03/26/2018 13:38   Dg Chest 2 View  Result Date: 04/01/2018 CLINICAL DATA:  Short of breath EXAM: CHEST - 2 VIEW COMPARISON:  03/30/2018, 03/26/2018, 02/24/2018 FINDINGS: Left-sided central venous port tip overlies the distal SVC. Mild emphysematous disease. More confluent regions of airspace disease at the bilateral lung bases, lingula and right middle lobe. Tiny pleural effusions are suspected. Stable cardiomediastinal silhouette. No pneumothorax. IMPRESSION: 1. Small pleural effusions with bilateral lower lung airspace disease suspicious for pneumonia. Electronically Signed   By: Donavan Foil M.D.   On: 04/01/2018 22:53   Dg Chest 2 View  Result Date: 03/30/2018 CLINICAL DATA:  Hypotension. EXAM: CHEST - 2 VIEW COMPARISON:  03/26/2018 FINDINGS: Lungs are hyperexpanded. Interstitial markings are diffusely coarsened with chronic features. Right pleuroparenchymal opacity is similar. Tiny right pleural effusion. The cardiopericardial silhouette is within normal limits for size. Left Port-A-Cath again noted. Bones are diffusely demineralized. IMPRESSION: Emphysema with similar appearance of right pleuroparenchymal opacity and tiny right effusion . Electronically Signed   By: Misty Stanley M.D.   On: 03/30/2018 18:11   Dg Chest 2 View  Result Date: 03/20/2018 CLINICAL DATA:  Pleurisy, follow-up from hospitalization 3 weeks ago, cough, shortness of breath and congestion remain, history RIGHT pleural effusion, diabetes mellitus, hypertension, former smoker, COPD EXAM: CHEST - 2 VIEW COMPARISON:  02/24/2018 FINDINGS: LEFT jugular Port-A-Cath with tip projecting over SVC. Normal heart size and pulmonary vascularity. Atherosclerotic  calcifications aorta. Moderate-sized hiatal hernia. Persistent RIGHT pleural effusion and basilar atelectasis. Minimal atelectasis at LEFT base as well. Emphysematous and bronchitic changes consistent with underlying COPD. No pneumothorax or acute osseous findings. Scattered endplate spur formation thoracic spine. IMPRESSION: COPD changes with bibasilar atelectasis and persistent RIGHT pleural effusion. Moderate-sized hiatal hernia. Electronically Signed   By: Lavonia Dana M.D.   On: 03/20/2018 11:03   US Thoracentesis Asp Pleural Space W/img Guide  Result Date: 03/26/2018 INDICATION: Patient with history of esophageal cancer, dyspnea, right pleural effusion. Request made for diagnostic and therapeutic right thoracentesis. EXAM: ULTRASOUND GUIDED DIAGNOSTIC AND THERAPEUTIC RIGHT THORACENTESIS MEDICATIONS: None COMPLICATIONS: None immediate. PROCEDURE: An ultrasound guided thoracentesis was thoroughly discussed with the patient and questions answered. The benefits, risks, alternatives and complications were also discussed. The patient understands and wishes to proceed with the procedure. Written consent was obtained. Ultrasound was performed to localize and mark an adequate pocket of fluid in the right chest. The area was then prepped and draped in the normal sterile fashion. 1% Lidocaine was used for local  anesthesia. Under ultrasound guidance a 6 Fr Safe-T-Centesis catheter was introduced. Thoracentesis was performed. The catheter was removed and a dressing applied. FINDINGS: A total of approximately 1.7 liters of yellow fluid was removed. Samples were sent to the laboratory as requested by the clinical team. IMPRESSION: Successful ultrasound guided diagnostic and therapeutic right thoracentesis yielding 1.7 liters of pleural fluid. Read by: Rowe Robert, PA-C Electronically Signed   By: Aletta Edouard M.D.   On: 03/26/2018 13:24        This patient was seen with Dr. [name] with my treatment plan reviewed  with him. He expressed agreement with my medical management of this patient.  This was a shared visit with Sandi Mealy.  Ronald Johnson has orthostasis, likely due to dehydration.  He will receive IVFs and his vital will be repeated.  Julieanne Manson, MD

## 2018-04-09 NOTE — Telephone Encounter (Signed)
Pt scheduled per 8/22 sch message.

## 2018-04-09 NOTE — Telephone Encounter (Signed)
Received call from pt regarding low BP. Returned call to pt. Pt reports that his BP has been low for the past 2 - 3 days. He states that it has mostly read in the 80's, "hasn't been over 100 on the top. Pt reported that his BP this AM was 86/36. Pt states that he does not take much by mouth, "maybe an ensure". This RN voiced understanding. Pt denies any accompanying symptoms and states "I feel fine". Pt to be seen in the clinic today for evaluation. Pt agreeable

## 2018-04-13 ENCOUNTER — Ambulatory Visit: Payer: Medicare Other | Admitting: Occupational Therapy

## 2018-04-13 ENCOUNTER — Ambulatory Visit: Payer: Medicare Other | Admitting: Physical Therapy

## 2018-04-14 ENCOUNTER — Other Ambulatory Visit: Payer: Self-pay | Admitting: Medical

## 2018-04-14 ENCOUNTER — Inpatient Hospital Stay (HOSPITAL_BASED_OUTPATIENT_CLINIC_OR_DEPARTMENT_OTHER): Payer: Medicare Other | Admitting: Medical

## 2018-04-14 ENCOUNTER — Inpatient Hospital Stay: Payer: Medicare Other

## 2018-04-14 VITALS — BP 97/55 | HR 72 | Temp 98.3°F | Resp 20 | Ht 76.0 in | Wt 185.3 lb

## 2018-04-14 DIAGNOSIS — Z5111 Encounter for antineoplastic chemotherapy: Secondary | ICD-10-CM | POA: Diagnosis not present

## 2018-04-14 DIAGNOSIS — I951 Orthostatic hypotension: Secondary | ICD-10-CM | POA: Diagnosis not present

## 2018-04-14 DIAGNOSIS — I739 Peripheral vascular disease, unspecified: Secondary | ICD-10-CM | POA: Diagnosis not present

## 2018-04-14 DIAGNOSIS — I2782 Chronic pulmonary embolism: Secondary | ICD-10-CM

## 2018-04-14 DIAGNOSIS — K922 Gastrointestinal hemorrhage, unspecified: Secondary | ICD-10-CM

## 2018-04-14 DIAGNOSIS — I10 Essential (primary) hypertension: Secondary | ICD-10-CM

## 2018-04-14 DIAGNOSIS — D649 Anemia, unspecified: Secondary | ICD-10-CM

## 2018-04-14 DIAGNOSIS — J449 Chronic obstructive pulmonary disease, unspecified: Secondary | ICD-10-CM | POA: Diagnosis not present

## 2018-04-14 DIAGNOSIS — C160 Malignant neoplasm of cardia: Secondary | ICD-10-CM | POA: Diagnosis not present

## 2018-04-14 DIAGNOSIS — R42 Dizziness and giddiness: Secondary | ICD-10-CM | POA: Diagnosis not present

## 2018-04-14 DIAGNOSIS — R4702 Dysphasia: Secondary | ICD-10-CM

## 2018-04-14 DIAGNOSIS — M1990 Unspecified osteoarthritis, unspecified site: Secondary | ICD-10-CM

## 2018-04-14 DIAGNOSIS — C155 Malignant neoplasm of lower third of esophagus: Secondary | ICD-10-CM | POA: Diagnosis not present

## 2018-04-14 DIAGNOSIS — K219 Gastro-esophageal reflux disease without esophagitis: Secondary | ICD-10-CM

## 2018-04-14 DIAGNOSIS — R5383 Other fatigue: Secondary | ICD-10-CM

## 2018-04-14 DIAGNOSIS — Z79899 Other long term (current) drug therapy: Secondary | ICD-10-CM

## 2018-04-14 DIAGNOSIS — F329 Major depressive disorder, single episode, unspecified: Secondary | ICD-10-CM

## 2018-04-14 DIAGNOSIS — Z87891 Personal history of nicotine dependence: Secondary | ICD-10-CM

## 2018-04-14 LAB — CBC WITH DIFFERENTIAL (CANCER CENTER ONLY)
Basophils Absolute: 0 10*3/uL (ref 0.0–0.1)
Basophils Relative: 0 %
EOS PCT: 3 %
Eosinophils Absolute: 0.3 10*3/uL (ref 0.0–0.5)
HCT: 28 % — ABNORMAL LOW (ref 38.4–49.9)
Hemoglobin: 8.8 g/dL — ABNORMAL LOW (ref 13.0–17.1)
LYMPHS ABS: 1.1 10*3/uL (ref 0.9–3.3)
LYMPHS PCT: 11 %
MCH: 33.8 pg — AB (ref 27.2–33.4)
MCHC: 31.4 g/dL — AB (ref 32.0–36.0)
MCV: 107.7 fL — AB (ref 79.3–98.0)
MONOS PCT: 10 %
Monocytes Absolute: 1 10*3/uL — ABNORMAL HIGH (ref 0.1–0.9)
Neutro Abs: 7.6 10*3/uL — ABNORMAL HIGH (ref 1.5–6.5)
Neutrophils Relative %: 76 %
PLATELETS: 195 10*3/uL (ref 140–400)
RBC: 2.6 MIL/uL — AB (ref 4.20–5.82)
RDW: 19.8 % — ABNORMAL HIGH (ref 11.0–14.6)
WBC Count: 10 10*3/uL (ref 4.0–10.3)

## 2018-04-14 NOTE — Progress Notes (Signed)
[facility] Progress Note   Ronald Johnson [id number] Apr 30, 1946 72 y.o.  Ronald Johnson is managed by Dr. Dominica Severin B. Sherrill  Actively treated with chemotherapy/immunotherapy: yes  Current Therapy: FOLFOX  Last Treated: 04/06/2018  Assessment: Plan:    Orthostatic hypotension  Gastrointestinal hemorrhage, unspecified gastrointestinal hemorrhage type  GE junction carcinoma (HCC)  Other chronic pulmonary embolism without acute cor pulmonale (HCC)   Orthostatic hypotension: The patient's orthostatic hypotension is greatly improved after beginning use of thigh-high compression stockings.  He will continue to monitor his blood pressure at home.  His blood pressure this morning was 102/58.  Gastrointestinal hemorrhage: A CBC returned today with a hemoglobin stable at 8.8.  This was compared to 8.9 when last checked 5 days ago.  Ronald Johnson will continue off of his Xarelto.  He will return to see Dr. Benay Johnson on 04/22/2018.  Metastatic carcinoma of the GE junction: Ronald Johnson is status post his most recent dosing of FOLFOX on 04/06/2018.  He will return to see Dr. Benay Johnson on 04/22/2018.  Pulmonary emboli found incidentally on a CT scan of the chest: The patient will continue off of Xarelto for now.  Please see After Visit Summary for patient specific instructions.  Future Appointments  Date Time Provider [place]  04/22/2018  8:30 AM [facility] LAB 1 [facility] None  04/22/2018  8:45 AM [facility] [place] FLUSH [facility] None  04/22/2018  9:15 AM Owens Shark, NP [facility] None  04/22/2018 11:00 AM [facility] INFUSION [facility] None  04/23/2018  8:15 AM Tat, Eustace Quail, DO [facility] None  04/24/2018  2:00 PM [facility] [place] FLUSH [facility] None  04/28/2018  9:30 AM Rine, Selmer Dominion, OT [facility]  04/28/2018 10:15 AM Dilday, Vinnie Level, PT [facility]  04/30/2018  9:30 AM Rine, Selmer Dominion, OT [facility] [place]  04/30/2018 10:15 AM Guido Sander, PT [facility]    05/07/2018  8:30 AM [facility] LAB 3 [facility] None  05/07/2018  8:45 AM [facility] [place] FLUSH [facility] None  05/07/2018  9:15 AM Owens Shark, NP [facility] None  05/07/2018 10:15 AM [facility] INFUSION [facility] None  05/09/2018  1:45 PM [facility] [place] FLUSH [facility] None  10-Jun-2018 10:15 AM Rigoberto Noel, MD [facility] None  05/19/2018 10:30 AM [name], MD [facility] None  05/20/2018  9:00 AM [facility] LAB 4 [facility] None  05/20/2018  9:15 AM [facility] [place] FLUSH [facility] None  05/20/2018  9:45 AM Owens Shark, NP [facility] None  05/20/2018 11:15 AM [facility] INFUSION [facility] None  05/20/2018 12:00 PM Karie Mainland, RD [facility] None  05/22/2018  3:15 PM [facility] [place] FLUSH [facility] None    No orders of the defined types were placed in this encounter.      Subjective:   Patient ID:  Ronald Johnson is a 72 y.o. (DOB 1945-12-13) male.  Chief Complaint: No chief complaint on file.   HPI Ronald Johnson is a 72 year old male with a history of a metastatic esophageal cancer who is status post dosing with FOLFOX on 04/06/2018 and is managed by Dr. Dominica Severin B. Sherrill. He was last seen on 04/09/2018 for orthostatic hypotension after being seen in the ER on the prior week. He was given 2 units of packed red blood cells on Monday then returned on  Wednesday and received an additional unit of packed red blood cells.  At his last visit, he reported dark stools without bright red blood per rectum.  He continued on Xarelto for PEs which were incidentally found  on a CT scan.  He was told to hold Xarelto. A CBC completed at that visit returned with a hemoglobin of 8.9 and a hematocrit of 28.7.  This was only slightly lower than a hemoglobin of 9.5 and a hematocrit of 29.8 when completed 8 days prior. An occult blood returned positive.  He was given 1 L of normal saline with his blood pressure improving.  He returns today for a CBC and follow-up of his GI bleeding.  Ronald Johnson  is having nausea but no vomiting.  He is wearing thigh-high compression stockings and is having less orthostasis and dizziness.  He denies bright red blood per rectum or melena.  He denies any other evidence of bleeding.  Medications: I have reviewed the patient's current medications.  Allergies: No Known Allergies  Past Medical History:  Diagnosis Date  . Anemia 08/15/2009  . Anxiety   . Arthritis   . [name]'s esophagus 2007  . COLONIC POLYPS, ADENOMATOUS 08/01/2008, 2013, 2014  . COPD (chronic obstructive pulmonary disease) (HCC)    no per pt  . Depression   . Diabetes type 2, controlled ([place]) 03/13/2007  . Diverticulitis   . ED (erectile dysfunction)   . EMPHYSEMA 11/08/2008   no per pt  . Esophageal cancer ([place]) "dx'd ~ 01/2015"  . GERD 03/13/2007  . GIB (gastrointestinal bleeding) 08/18/2015  . Hiatal hernia   . History of blood transfusion 08/18/2015   due to GIB  . HYPERCHOLESTEROLEMIA 02/03/2008  . HYPERTENSION 03/13/2007   pt denies, claims white coat syndrome  . Pneumonia   . Prostatism   . PULMONARY NODULE 11/24/2008  . Stomach cancer ([place]) "dx'd ~ 01/2015"  . TOBACCO ABUSE 11/24/2008    Past Surgical History:  Procedure Laterality Date  . BALLOON DILATION N/A 03/19/2016   Procedure: BALLOON DILATION;  Surgeon: Irene Shipper, MD;  Location: [facility];  Service: Endoscopy;  Laterality: N/A;  . CIRCUMCISION    . COLONOSCOPY    . COMPLETE ESOPHAGECTOMY N/A 07/25/2015   Procedure: TRANSHIATAL TOTAL ESOPHAGECTOMY COMPLETE PYLOROMYOTOMY;  Surgeon: [name], MD;  Location: [facility];  Service: Thoracic;  Laterality: N/A;  . EGD  11/19/2005  . ESOPHAGOGASTRODUODENOSCOPY N/A 03/19/2016   Procedure: ESOPHAGOGASTRODUODENOSCOPY (EGD);  Surgeon: Irene Shipper, MD;  Location: [facility];  Service: Endoscopy;  Laterality: N/A;  . ESOPHAGOGASTRODUODENOSCOPY (EGD) WITH PROPOFOL N/A 09/15/2017   Procedure: ESOPHAGOGASTRODUODENOSCOPY (EGD) WITH PROPOFOL;  Surgeon: Irene Shipper, MD;   Location: [facility];  Service: Endoscopy;  Laterality: N/A;  . EUS N/A 03/16/2015   Procedure: UPPER ENDOSCOPIC ULTRASOUND (EUS) RADIAL;  Surgeon: Milus Banister, MD;  Location: [facility];  Service: Endoscopy;  Laterality: N/A;  . FOOT FRACTURE SURGERY Right 1980   right foot w/ pins and screws  . IR IMAGING GUIDED PORT INSERTION  01/05/2018  . IR US GUIDE VASC ACCESS LEFT  01/05/2018  . JEJUNOSTOMY N/A 07/25/2015   Procedure: FEEDING JEJUNOSTOMY;  Surgeon: [name], MD;  Location: [facility];  Service: Thoracic;  Laterality: N/A;  . MICROLARYNGOSCOPY W/VOCAL CORD INJECTION N/A 01/22/2018   Procedure: MICROLARYNGOSCOPY WITH VOCAL CORD INJECTION;  Surgeon: Melissa Montane, MD;  Location: [facility];  Service: ENT;  Laterality: N/A;  . POLYPECTOMY    . removal pins and screws foot  1980   right foot  . UPPER GASTROINTESTINAL ENDOSCOPY    . VASECTOMY    . VIDEO BRONCHOSCOPY N/A 07/25/2015   Procedure: VIDEO BRONCHOSCOPY;  Surgeon: [name], MD;  Location:  [facility];  Service: Thoracic;  Laterality: N/A;    Family History  Problem Relation Age of Onset  . Stroke Mother   . Diabetes Mother   . Diabetes Maternal Grandmother        mother side of the family aunts, MGF  . Breast cancer Paternal Grandmother   . AAA (abdominal aortic aneurysm) Father   . Colon cancer Neg Hx   . Esophageal cancer Neg Hx   . Rectal cancer Neg Hx   . Stomach cancer Neg Hx     Social History   Socioeconomic History  . Marital status: Married    Spouse name: Not on file  . Number of children: 2  . Years of education: Not on file  . Highest education level: Not on file  Occupational History  . Occupation: retired    Fish farm manager: [employer]    Comment: works Education administrator  . Financial resource strain: Not on file  . Food insecurity:    Worry: Not on file    Inability: Not on file  . Transportation needs:    Medical: Not on file    Non-medical: Not on file  Tobacco Use    . Smoking status: Former Smoker    Packs/day: 1.00    Years: 35.00    Pack years: 35.00    Types: Cigarettes    Last attempt to quit: 10/31/2008    Years since quitting: 9.4  . Smokeless tobacco: Never Used  Substance and Sexual Activity  . Alcohol use: No    Alcohol/week: 0.0 standard drinks  . Drug use: No  . Sexual activity: Not Currently    Partners: Female  Lifestyle  . Physical activity:    Days per week: Not on file    Minutes per session: Not on file  . Stress: Not on file  Relationships  . Social connections:    Talks on phone: Not on file    Gets together: Not on file    Attends religious service: Not on file    Active member of club or organization: Not on file    Attends meetings of clubs or organizations: Not on file    Relationship status: Not on file  . Intimate partner violence:    Fear of current or ex partner: Not on file    Emotionally abused: Not on file    Physically abused: Not on file    Forced sexual activity: Not on file  Other Topics Concern  . 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   Please see review of systems for further details on the patient's review from today.   Review of Systems:  Review of Systems  Constitutional: Positive for fatigue (Fatigue unchanged from baseline). Negative for activity change.  Respiratory: Negative for shortness of breath.   Cardiovascular: Negative for leg swelling.  Gastrointestinal: Negative for anal bleeding, blood in stool, constipation and diarrhea.    Objective:   Physical Exam:  BP (!) 97/55 (BP Location: Left Arm, Patient Position: Sitting)   Pulse 72   Temp 98.3 F (36.8 C) (Oral)   Resp 20   Ht _0  (1.93 m)   Wt  185 lb  4.8 oz (84.1 kg)   SpO2 95%   BMI 22.56 kg/m  ECOG: 1  Physical Exam  Constitutional: No distress.  Ronald Johnson is an adult male who appears to be older than his stated age.  He appears to be chronically ill but in no acute distress.  HENT:  Head: Normocephalic and atraumatic.  Musculoskeletal: He exhibits no edema.  Bilateral thigh-high compression stockings are noted.  Neurological: He is alert. Coordination (Ronald Johnson is ambulating with the use of a wheelchair.) abnormal.  Skin: Skin is warm and dry. No rash noted. He is not diaphoretic. No erythema. No pallor.    Lab Review:     Component Value Date/Time   NA 139 04/09/2018 1037   NA 138 08/05/2017 1547   K 4.4 04/09/2018 1037   K 4.0 08/05/2017 1547   CL 105 04/09/2018 1037   CO2 26 04/09/2018 1037   CO2 27 08/05/2017 1547   GLUCOSE 241 (H) 04/09/2018 1037   GLUCOSE 184 (H) 08/05/2017 1547   GLUCOSE 129 (H) 06/30/2006 0809   BUN 18 04/09/2018 1037   BUN 18.3 08/05/2017 1547   CREATININE 1.12 04/09/2018 1037   CREATININE 1.3 08/05/2017 1547   CALCIUM 9.2 04/09/2018 1037   CALCIUM 9.5 08/05/2017 1547   PROT 6.8 04/09/2018 1037   PROT 8.0 08/05/2017 1547   ALBUMIN 3.0 (L) 04/09/2018 1037   ALBUMIN 3.6 08/05/2017 1547   AST 13 (L) 04/09/2018 1037   AST 28 08/05/2017 1547   ALT 7 04/09/2018 1037   ALT 27 08/05/2017 1547   ALKPHOS 116 04/09/2018 1037   ALKPHOS 95 08/05/2017 1547   BILITOT 0.9 04/09/2018 1037   BILITOT 0.50 08/05/2017 1547   GFRNONAA >60 04/09/2018 1037   GFRAA >60 04/09/2018 1037       Component Value Date/Time   WBC 10.0 04/14/2018 1401   WBC 4.3 04/02/2018 0446   RBC 2.60 (L) 04/14/2018 1401   HGB 8.8 (L) 04/14/2018 1401   HGB 12.9 (L) 08/05/2017 1547   HCT 28.0 (L) 04/14/2018 1401   HCT 39.2 08/05/2017 1547   PLT 195 04/14/2018 1401   PLT 160 08/05/2017 1547   MCV 107.7 (H) 04/14/2018 1401   MCV 96.6 08/05/2017 1547   MCH 33.8 (H) 04/14/2018 1401   MCHC 31.4 (L) 04/14/2018 1401    RDW 19.8 (H) 04/14/2018 1401   RDW 13.2 08/05/2017 1547   LYMPHSABS 1.1 04/14/2018 1401   LYMPHSABS 2.5 08/05/2017 1547   MONOABS 1.0 (H) 04/14/2018 1401   MONOABS 0.6 08/05/2017 1547   EOSABS 0.3 04/14/2018 1401   EOSABS 0.4 08/05/2017 1547   BASOSABS 0.0 04/14/2018 1401   BASOSABS 0.0 08/05/2017 1547   -------------------------------  Imaging from last 24 hours (if applicable):  Radiology interpretation: Dg Chest 1 View  Result Date: 03/26/2018 CLINICAL DATA:  72 year old male status post right thoracentesis EXAM: CHEST  1 VIEW COMPARISON:  03/20/2018 FINDINGS: Cardiomediastinal silhouette unchanged in size and contour. Decreased fluid at the right lung base with no pneumothorax. Blunting of the bilateral costophrenic angles persists. Unchanged left IJ approach port catheter. Coarsened interstitial markings bilaterally with similar appearance of patchy opacity. IMPRESSION: No complicating features status post right thoracentesis. Likely trace pleural fluid bilaterally. Electronically Signed   By: Corrie Mckusick D.O.   On: 03/26/2018 13:38   Dg Chest 2 View  Result Date: 04/01/2018 CLINICAL DATA:  Short of breath EXAM: CHEST - 2 VIEW COMPARISON:  03/30/2018, 03/26/2018, 02/24/2018 FINDINGS: Left-sided central venous port tip overlies  the distal SVC. Mild emphysematous disease. More confluent regions of airspace disease at the bilateral lung bases, lingula and right middle lobe. Tiny pleural effusions are suspected. Stable cardiomediastinal silhouette. No pneumothorax. IMPRESSION: 1. Small pleural effusions with bilateral lower lung airspace disease suspicious for pneumonia. Electronically Signed   By: Donavan Foil M.D.   On: 04/01/2018 22:53   Dg Chest 2 View  Result Date: 03/30/2018 CLINICAL DATA:  Hypotension. EXAM: CHEST - 2 VIEW COMPARISON:  03/26/2018 FINDINGS: Lungs are hyperexpanded. Interstitial markings are diffusely coarsened with chronic features. Right pleuroparenchymal  opacity is similar. Tiny right pleural effusion. The cardiopericardial silhouette is within normal limits for size. Left Port-A-Cath again noted. Bones are diffusely demineralized. IMPRESSION: Emphysema with similar appearance of right pleuroparenchymal opacity and tiny right effusion . Electronically Signed   By: Misty Stanley M.D.   On: 03/30/2018 18:11   Dg Chest 2 View  Result Date: 03/20/2018 CLINICAL DATA:  Pleurisy, follow-up from hospitalization 3 weeks ago, cough, shortness of breath and congestion remain, history RIGHT pleural effusion, diabetes mellitus, hypertension, former smoker, COPD EXAM: CHEST - 2 VIEW COMPARISON:  02/24/2018 FINDINGS: LEFT jugular Port-A-Cath with tip projecting over SVC. Normal heart size and pulmonary vascularity. Atherosclerotic calcifications aorta. Moderate-sized hiatal hernia. Persistent RIGHT pleural effusion and basilar atelectasis. Minimal atelectasis at LEFT base as well. Emphysematous and bronchitic changes consistent with underlying COPD. No pneumothorax or acute osseous findings. Scattered endplate spur formation thoracic spine. IMPRESSION: COPD changes with bibasilar atelectasis and persistent RIGHT pleural effusion. Moderate-sized hiatal hernia. Electronically Signed   By: Lavonia Dana M.D.   On: 03/20/2018 11:03   US Thoracentesis Asp Pleural Space W/img Guide  Result Date: 03/26/2018 INDICATION: Patient with history of esophageal cancer, dyspnea, right pleural effusion. Request made for diagnostic and therapeutic right thoracentesis. EXAM: ULTRASOUND GUIDED DIAGNOSTIC AND THERAPEUTIC RIGHT THORACENTESIS MEDICATIONS: None COMPLICATIONS: None immediate. PROCEDURE: An ultrasound guided thoracentesis was thoroughly discussed with the patient and questions answered. The benefits, risks, alternatives and complications were also discussed. The patient understands and wishes to proceed with the procedure. Written consent was obtained. Ultrasound was performed to  localize and mark an adequate pocket of fluid in the right chest. The area was then prepped and draped in the normal sterile fashion. 1% Lidocaine was used for local anesthesia. Under ultrasound guidance a 6 Fr Safe-T-Centesis catheter was introduced. Thoracentesis was performed. The catheter was removed and a dressing applied. FINDINGS: A total of approximately 1.7 liters of yellow fluid was removed. Samples were sent to the laboratory as requested by the clinical team. IMPRESSION: Successful ultrasound guided diagnostic and therapeutic right thoracentesis yielding 1.7 liters of pleural fluid. Read by: Rowe Robert, PA-C Electronically Signed   By: Aletta Edouard M.D.   On: 03/26/2018 13:24        This case was discussed with Dr. Benay Johnson. He expressed agreement with my management of this patient.

## 2018-04-16 ENCOUNTER — Ambulatory Visit: Payer: Self-pay | Admitting: Physical Therapy

## 2018-04-16 ENCOUNTER — Encounter: Payer: Self-pay | Admitting: Occupational Therapy

## 2018-04-21 ENCOUNTER — Encounter: Payer: Self-pay | Admitting: Occupational Therapy

## 2018-04-21 ENCOUNTER — Ambulatory Visit: Payer: Self-pay | Admitting: Physical Therapy

## 2018-04-21 NOTE — Progress Notes (Signed)
Subjective:   Ronald Johnson was seen in consultation in the movement disorder clinic at the request of [name], MD.  This patient is accompanied in the office by his spouse who supplements the history.  The records that were made available to me were reviewed, including records from recent hospitalization.  The evaluation is for tremor.  Tremor started approximately 2 years ago and involves the bilateral hands.   He is R handed but both hands have equal tremor.   Tremor is most noticeable when writing but also when his hands are at rest.  It is worse in the AM.   There is no family hx of tremor.    Affected by caffeine:  No. (2 cups of coffee per day) Affected by alcohol:  hasnt had EtOh for 2 years Affected by stress:  Yes.   Affected by fatigue:  No. Spills soup if on spoon:  No. Spills glass of liquid if full:  No. Affects ADL's (tying shoes, brushing teeth, etc):  No.   Any other sx's: Voice: no change Sleep: sleeps well  Vivid Dreams:  Yes.    Acting out dreams:  No. Wet Pillows: No. Postural symptoms:  Yes.    Falls?  No. Bradykinesia symptoms: no bradykinesia noted Loss of smell:  No. Loss of taste:  No. Urinary Incontinence:  No. Difficulty Swallowing:  Yes.  , trouble with meats since esophageal CA and surgery for removal of stomach and part of esophagus Handwriting, micrographia: No., just more tremulous Depression:  No. Memory changes:  Yes.  , short term; recently in the hospital after having spell and "couldn't think clearly" and couldn't get words out correct.  He tripped when got out of his truck but otherwise could walk.  No lateralizing weakness or parethesias.  Wife stated that for a few words it was like he was like talking in tongues.  Lasted 1-2 hours.  He went to the hospital and started on plavix.  Had 2 other similar episodes within 6 months but didn't go to the hospital and took a nap and when woke up he got better.  His lipitor was increased from 10 mg  to 40 mg in the hospital.   N/V:  Yes.   , had some nausea but better (due to lack of stomach) Lightheaded:  No.  Syncope: No. Diplopia:  No. Dyskinesia:  No.   Current/Previously tried tremor medications: n/a  Current medications that may exacerbate tremor:  n/a  Outside reports reviewed: historical medical records, lab reports, office notes and referral letter/letters.  Patient brought me MRI of the brain films dated June 09, 2017.  These were done without gadolinium.  I did review these.  Brain images were limited as this was primarily an MRA of the brain.  Diffusion weighted images were negative.  There was evidence of fairly significant small vessel disease from what I could see.  MRA of the brain was reported to show no evidence of focal stenosis within the internal carotid arteries.  There was some decreased flow within the left vertebral artery, but notes indicated that this could be exaggerated by the technique utilized.  10/06/17 update: Patient returns today at the request of Dr. Loanne Drilling for a new issue.  The records that were made available to me were reviewed.  This patient is accompanied in the office by his spouse who supplements the history. The patient returns to the office for evaluation of headache.  Headache started at the end of September.  He was seen for this headache in November, but at that time it was unknown at that he had developed a local recurrence of his esophageal cancer.  Headache is located in the bilateral frontal region.  Headaches are not unusual for him but they usually are not persistent.  There is no photophobia or phonophobia.  No nausea or vomiting.  Patient's headache is described as stabbing.  It goes away and comes back.  The headache lasts from 15 min to 5 hours.  He was given a prescription for tramadol in November by Dr. Loanne Drilling.  He has run out of that.  He is taking hydrocodone or oxycodone now.  He is taking something 6 days per week for headache.   He states that during radiation daily they put a mask on him and this creates a headache.  Wife notes that he has a "small eye" on the L and he thinks that it correlates with the headache. Unknown if sweats on both sides of face.    Unsure if pupils equal.  Pt doesn't notice the small eye.  Only the wife.  "small eye" comes and goes.  Wife unsure if present after wakes up or after nap.   CT of the brain was completed on August 06, 2017 to stage recurrence of cancer. this was done with and without contrast.  There is no evidence of metastatic disease on this.  11/19/17 update: Patient is seen today in follow-up.  Numerous records have been reviewed.  Patient was sent for MRI/MRA, but was not able to complete the test due to claustrophobia.  We offered sedating medication, but ultimately the patient did not want to proceed.  The patient had been undergoing radiation.  Record review indicate that there was encasement of the right common carotid artery at the thoracic inlet by a 5.9 x 3.7 x 5.1 cm mass with ill-defined margins.  He has been undergoing radiation therapy and completed about 3 weeks ago.  He hated the radiation mask.  We did start topiramate last visit for his headaches.  He reports today that headaches are better. He states that he is at least 50% better.  Headaches are mild and may be daily but don't last long.    04/23/18 update: Patient is seen today in follow-up for Horner syndrome and headache.  Records have been reviewed since our last visit.  Patient is currently on topiramate, 50 mg in the morning and 100 mg at night.  Oncology records have been reviewed since last visit.  The patient has persistent dysphagia as there is a persistent mass at the thoracic inlet.  In June, he was taken to the operating room because of vocal cord paralysis for right vocal cord injection to see if that would help his dysphasia.  Pt reports that it didn't help.  Patient was admitted to the hospital for a few days in  July.  Patient was septic.  Pleural effusion was noted on the right.  Thoracentesis was completed on August 8 with cytology indicating malignant pleural effusion.  In addition, pulmonary emboli were noted on his CT during his hospitalization and he was started on Xarelto.  He was on the emergency room on August 12/August 13 and had hypotension secondary to anemia.  Blood transfusion was recommended.  Patient was transfused 2 units and discharged from the emergency room and xarelto was d/c.  Was back in the emergency room and admitted overnight on August 14 for choking on a piece of his chicken  dinner.  No Known Allergies  Outpatient Encounter Medications as of 04/23/2018  Medication Sig  . albuterol (PROVENTIL HFA;VENTOLIN HFA) 108 (90 Base) MCG/ACT inhaler Inhale 2 puffs into the lungs every 6 (six) hours as needed for wheezing or shortness of breath.  . diazepam (VALIUM) 5 MG tablet Take 5 mg by mouth every 6 (six) hours as needed for anxiety.  Marland Kitchen escitalopram (LEXAPRO) 20 MG tablet Take 1 tablet (20 mg total) by mouth daily.  . fluticasone furoate-vilanterol (BREO ELLIPTA) 100-25 MCG/INH AEPB Inhale 1 puff into the lungs daily.  Marland Kitchen glucagon (GLUCAGON EMERGENCY) 1 MG injection Inject 1 mg into the vein once as needed for up to 1 dose. (Patient taking differently: Inject 1 mg into the vein once as needed (for low blood sugars). )  . glucose blood (PRECISION XTRA TEST STRIPS) test strip Check blood sugar three times a day dx 250.01  . guaiFENesin (MUCINEX) 600 MG 12 hr tablet Take 1 tablet (600 mg total) by mouth 2 (two) times daily.  . insulin aspart protamine- aspart (NOVOLOG MIX 70/30) (70-30) 100 UNIT/ML injection Inject 0.5 mLs (50 Units total) into the skin daily with breakfast.  . Insulin Syringe-Needle U-100 30G X 3/8" 1 ML MISC Use to inject insulin 3 times daily.  Marland Kitchen lidocaine-prilocaine (EMLA) cream Apply to Port-A-Cath site 1 hour prior to use (Patient taking differently: Apply 1 application  topically once. Apply to Port-A-Cath site 1 hour prior to use)  . omeprazole (PRILOSEC) 40 MG capsule TAKE 1 CAPSULE(40 MG) BY MOUTH TWICE DAILY  . prochlorperazine (COMPAZINE) 5 MG tablet Take 1 tablet (5 mg total) by mouth every 6 (six) hours as needed for nausea or vomiting.  . RABEprazole (ACIPHEX) 20 MG tablet TAKE 1 TABLET(20 MG) BY MOUTH TWICE DAILY  . topiramate (TOPAMAX) 50 MG tablet 2 in the AM, 1 at night (Patient taking differently: Take 50-100 mg by mouth See admin instructions. 2 in the AM, 1 at night)  . [DISCONTINUED] ondansetron (ZOFRAN) 4 MG tablet Take 1 tablet (4 mg total) by mouth every 6 (six) hours as needed for nausea.  . [DISCONTINUED] XARELTO 20 MG TABS tablet TAKE 1 TABLET(20 MG) BY MOUTH DAILY WITH SUPPER   No facility-administered encounter medications on file as of 04/23/2018.     Past Medical History:  Diagnosis Date  . Anemia 08/15/2009  . Anxiety   . Arthritis   . [name]'s esophagus 2007  . COLONIC POLYPS, ADENOMATOUS 08/01/2008, 2013, 2014  . COPD (chronic obstructive pulmonary disease) (HCC)    no per pt  . Depression   . Diabetes type 2, controlled ([place]) 03/13/2007  . Diverticulitis   . ED (erectile dysfunction)   . EMPHYSEMA 11/08/2008   no per pt  . Esophageal cancer ([place]) "dx'd ~ 01/2015"  . GERD 03/13/2007  . GIB (gastrointestinal bleeding) 08/18/2015  . Hiatal hernia   . History of blood transfusion 08/18/2015   due to GIB  . HYPERCHOLESTEROLEMIA 02/03/2008  . HYPERTENSION 03/13/2007   pt denies, claims white coat syndrome  . Pneumonia   . Prostatism   . PULMONARY NODULE 11/24/2008  . Stomach cancer ([place]) "dx'd ~ 01/2015"  . TOBACCO ABUSE 11/24/2008    Past Surgical History:  Procedure Laterality Date  . BALLOON DILATION N/A 03/19/2016   Procedure: BALLOON DILATION;  Surgeon: Irene Shipper, MD;  Location: [facility];  Service: Endoscopy;  Laterality: N/A;  . CIRCUMCISION    . COLONOSCOPY    . COMPLETE ESOPHAGECTOMY N/A 07/25/2015  Procedure: TRANSHIATAL TOTAL ESOPHAGECTOMY COMPLETE PYLOROMYOTOMY;  Surgeon: [name], MD;  Location: [facility];  Service: Thoracic;  Laterality: N/A;  . EGD  11/19/2005  . ESOPHAGOGASTRODUODENOSCOPY N/A 03/19/2016   Procedure: ESOPHAGOGASTRODUODENOSCOPY (EGD);  Surgeon: Irene Shipper, MD;  Location: [facility];  Service: Endoscopy;  Laterality: N/A;  . ESOPHAGOGASTRODUODENOSCOPY (EGD) WITH PROPOFOL N/A 09/15/2017   Procedure: ESOPHAGOGASTRODUODENOSCOPY (EGD) WITH PROPOFOL;  Surgeon: Irene Shipper, MD;  Location: [facility];  Service: Endoscopy;  Laterality: N/A;  . EUS N/A 03/16/2015   Procedure: UPPER ENDOSCOPIC ULTRASOUND (EUS) RADIAL;  Surgeon: Milus Banister, MD;  Location: [facility];  Service: Endoscopy;  Laterality: N/A;  . FOOT FRACTURE SURGERY Right 1980   right foot w/ pins and screws  . IR IMAGING GUIDED PORT INSERTION  01/05/2018  . IR US GUIDE VASC ACCESS LEFT  01/05/2018  . JEJUNOSTOMY N/A 07/25/2015   Procedure: FEEDING JEJUNOSTOMY;  Surgeon: [name], MD;  Location: [facility];  Service: Thoracic;  Laterality: N/A;  . MICROLARYNGOSCOPY W/VOCAL CORD INJECTION N/A 01/22/2018   Procedure: MICROLARYNGOSCOPY WITH VOCAL CORD INJECTION;  Surgeon: Melissa Montane, MD;  Location: [facility];  Service: ENT;  Laterality: N/A;  . POLYPECTOMY    . removal pins and screws foot  1980   right foot  . UPPER GASTROINTESTINAL ENDOSCOPY    . VASECTOMY    . VIDEO BRONCHOSCOPY N/A 07/25/2015   Procedure: VIDEO BRONCHOSCOPY;  Surgeon: [name], MD;  Location: [facility] OR;  Service: Thoracic;  Laterality: N/A;    Social History   Socioeconomic History  . Marital status: Married    Spouse name: Not on file  . Number of children: 2  . Years of education: Not on file  . Highest education level: Not on file  Occupational History  . Occupation: retired    Fish farm manager: [employer]    Comment: works Education administrator  . Financial resource strain: Not on file  . Food  insecurity:    Worry: Not on file    Inability: Not on file  . Transportation needs:    Medical: Not on file    Non-medical: Not on file  Tobacco Use  . Smoking status: Former Smoker    Packs/day: 1.00    Years: 35.00    Pack years: 35.00    Types: Cigarettes    Last attempt to quit: 10/31/2008    Years since quitting: 9.4  . Smokeless tobacco: Never Used  Substance and Sexual Activity  . Alcohol use: No    Alcohol/week: 0.0 standard drinks  . Drug use: No  . Sexual activity: Not Currently    Partners: Female  Lifestyle  . Physical activity:    Days per week: Not on file    Minutes per session: Not on file  . Stress: Not on file  Relationships  . Social connections:    Talks on phone: Not on file    Gets together: Not on file    Attends religious service: Not on file    Active member of club or organization: Not on file    Attends meetings of clubs or organizations: Not on file    Relationship status: Not on file  . Intimate partner violence:    Fear of current or ex partner: Not on file    Emotionally abused: Not on file    Physically abused: Not on file    Forced sexual activity: Not on file  Other Topics Concern  .  Not on file  Social History Narrative   Married, wife [place] with #2 grown children   Prior [employer], where he met his wife in [place]   Retired-prior work Personnel officer ADL's    Family Status  Relation Name Status  . Mother  Deceased       CVA  . MGM  (Not Specified)  . PGM  (Not Specified)  . Father  Deceased  . Sister 3 Alive  . Brother 1 Alive  . Neg Hx  (Not Specified)    Review of Systems Review of Systems  Constitutional: Positive for malaise/fatigue and weight loss.  HENT: Negative.   Respiratory: Positive for cough.   Gastrointestinal:       Dysphagia but that is improving  Genitourinary: Negative.   Musculoskeletal: Negative.   Skin: Negative.   Psychiatric/Behavioral: Positive for memory loss ("I have  chemo brain").      Objective:   VITALS:   Vitals:   04/23/18 0810  BP: 114/64  Pulse: 86  SpO2: 90%  Weight: 186 lb (84.4 kg)  Height: _0  (1.93 m)   Wt Readings from Last 3 Encounters:  04/23/18 186 lb (84.4 kg)  04/22/18 183 lb 4.8 oz (83.1 kg)  04/14/18 185 lb 4.8 oz (84.1 kg)     Gen:  Appears stated age and in NAD. HEENT:  Normocephalic, atraumatic. The mucous membranes are moist. The superficial temporal arteries are without ropiness or tenderness. Cardiovascular: Regular rate and rhythm. Lungs: Clear to auscultation bilaterally once he coughs.  He has upper airway rhonchi. Neck: no bruits  NEUROLOGICAL:  Orientation:  The patient is alert and oriented x 3.   Cranial nerves: There is good facial symmetry. The pupils are equal round and reactive to light bilaterally (no miosis). Slight R ptosis (same as previous). Speech is fluent and clear but is soft and hoarse. Soft palate rises symmetrically and there is no tongue deviation. Hearing is intact to conversational tone. Tone: Tone is good throughout. Sensation: Sensation is intact to light touch  Coordination:  The patient has no decremation with any form of RAMS, including alternating supination and pronation of the forearm, hand opening and closing, finger taps, heel taps and toe taps. Motor: Strength is at least antigravity x 4 Gait and Station: The patient is able to ambulate without difficulty.  MOVEMENT EXAM: Tremor:  There is no tremor today  Lab Results  Component Value Date   HGBA1C 7.8 (A) 03/17/2018   Lab Results  Component Value Date   TSH 1.95 05/06/2017   Lab Results  Component Value Date   VITAMINB12 545 08/03/2013   Lab Results  Component Value Date   WBC 5.7 04/22/2018   HGB 8.5 (L) 04/22/2018   HCT 27.0 (L) 04/22/2018   MCV 105.5 (H) 04/22/2018   PLT 212 04/22/2018        Assessment/Plan:   1.  Horner's syndrome  -This is due to encasement of the right common carotid artery  by a mass, which is adenocarcinoma of the esophagus.  He has completed radiation and undergoing chemo.    -asks me about "chemo brain" and memory loss/fatigue related to chemo.  Unfortunately, nothing to be done about that from my standpoint.  Has had transfusion which initially helped fatigue some and may need future transfusions.  He is anemic  2.  Headache  -resolved.  Pt very much wants to get off of topamax.  Will wean over the next 2-3 weeks.  Will let me know if headaches return  2.  Peripheral neuropathy  -The patient has clinical examination evidence of a diffuse peripheral neuropathy, which is likely due to peripheral neuropathy.  Safety was discussed.  3. F/u prn.  Much greater than 50% of this visit was spent in counseling and coordinating care.  Total face to face time:  25 min     CC:  [name], MD

## 2018-04-22 ENCOUNTER — Inpatient Hospital Stay: Payer: Medicare Other

## 2018-04-22 ENCOUNTER — Encounter: Payer: Self-pay | Admitting: Nurse Practitioner

## 2018-04-22 ENCOUNTER — Inpatient Hospital Stay (HOSPITAL_BASED_OUTPATIENT_CLINIC_OR_DEPARTMENT_OTHER): Payer: Medicare Other | Admitting: Nurse Practitioner

## 2018-04-22 ENCOUNTER — Inpatient Hospital Stay: Payer: Medicare Other | Attending: Nurse Practitioner

## 2018-04-22 VITALS — BP 96/53 | HR 65 | Temp 97.7°F | Resp 18 | Ht 76.0 in | Wt 183.3 lb

## 2018-04-22 DIAGNOSIS — R0600 Dyspnea, unspecified: Secondary | ICD-10-CM | POA: Insufficient documentation

## 2018-04-22 DIAGNOSIS — Z5111 Encounter for antineoplastic chemotherapy: Secondary | ICD-10-CM | POA: Diagnosis not present

## 2018-04-22 DIAGNOSIS — R918 Other nonspecific abnormal finding of lung field: Secondary | ICD-10-CM | POA: Diagnosis not present

## 2018-04-22 DIAGNOSIS — C155 Malignant neoplasm of lower third of esophagus: Secondary | ICD-10-CM

## 2018-04-22 DIAGNOSIS — D649 Anemia, unspecified: Secondary | ICD-10-CM | POA: Insufficient documentation

## 2018-04-22 DIAGNOSIS — Z95828 Presence of other vascular implants and grafts: Secondary | ICD-10-CM

## 2018-04-22 DIAGNOSIS — Z923 Personal history of irradiation: Secondary | ICD-10-CM | POA: Diagnosis not present

## 2018-04-22 DIAGNOSIS — R05 Cough: Secondary | ICD-10-CM | POA: Diagnosis not present

## 2018-04-22 DIAGNOSIS — C160 Malignant neoplasm of cardia: Secondary | ICD-10-CM

## 2018-04-22 DIAGNOSIS — Z9221 Personal history of antineoplastic chemotherapy: Secondary | ICD-10-CM | POA: Diagnosis not present

## 2018-04-22 DIAGNOSIS — Z5189 Encounter for other specified aftercare: Secondary | ICD-10-CM | POA: Insufficient documentation

## 2018-04-22 DIAGNOSIS — C159 Malignant neoplasm of esophagus, unspecified: Secondary | ICD-10-CM

## 2018-04-22 LAB — CBC WITH DIFFERENTIAL (CANCER CENTER ONLY)
Basophils Absolute: 0.1 10*3/uL (ref 0.0–0.1)
Basophils Relative: 1 %
Eosinophils Absolute: 0.2 10*3/uL (ref 0.0–0.5)
Eosinophils Relative: 3 %
HCT: 27 % — ABNORMAL LOW (ref 38.4–49.9)
HEMOGLOBIN: 8.5 g/dL — AB (ref 13.0–17.1)
LYMPHS PCT: 15 %
Lymphs Abs: 0.9 10*3/uL (ref 0.9–3.3)
MCH: 33.2 pg (ref 27.2–33.4)
MCHC: 31.5 g/dL — ABNORMAL LOW (ref 32.0–36.0)
MCV: 105.5 fL — AB (ref 79.3–98.0)
Monocytes Absolute: 0.4 10*3/uL (ref 0.1–0.9)
Monocytes Relative: 7 %
NEUTROS ABS: 4.2 10*3/uL (ref 1.5–6.5)
NEUTROS PCT: 74 %
Platelet Count: 212 10*3/uL (ref 140–400)
RBC: 2.56 MIL/uL — AB (ref 4.20–5.82)
RDW: 17.6 % — ABNORMAL HIGH (ref 11.0–14.6)
WBC Count: 5.7 10*3/uL (ref 4.0–10.3)

## 2018-04-22 LAB — CMP (CANCER CENTER ONLY)
ALT: <6 U/L (ref 0–44)
ANION GAP: 8 (ref 5–15)
AST: 11 U/L — AB (ref 15–41)
Albumin: 2.9 g/dL — ABNORMAL LOW (ref 3.5–5.0)
Alkaline Phosphatase: 83 U/L (ref 38–126)
BUN: 18 mg/dL (ref 8–23)
CHLORIDE: 106 mmol/L (ref 98–111)
CO2: 24 mmol/L (ref 22–32)
Calcium: 9.3 mg/dL (ref 8.9–10.3)
Creatinine: 1.03 mg/dL (ref 0.61–1.24)
GFR, Estimated: >60 mL/min (ref >60)
Glucose, Bld: 210 mg/dL — ABNORMAL HIGH (ref 70–99)
POTASSIUM: 4.2 mmol/L (ref 3.5–5.1)
Sodium: 138 mmol/L (ref 135–145)
Total Bilirubin: 0.2 mg/dL — ABNORMAL LOW (ref 0.3–1.2)
Total Protein: 7.1 g/dL (ref 6.5–8.1)

## 2018-04-22 LAB — SAMPLE TO BLOOD BANK

## 2018-04-22 LAB — CEA (IN HOUSE-CHCC): CEA (CHCC-IN HOUSE): 5.93 ng/mL — AB (ref 0.00–5.00)

## 2018-04-22 MED ORDER — SODIUM CHLORIDE 0.9% FLUSH
10.0000 mL | INTRAVENOUS | Status: DC | PRN
  Administered 2018-04-22: 10 mL
  Filled 2018-04-22: qty 10

## 2018-04-22 MED ORDER — DEXAMETHASONE SODIUM PHOSPHATE 10 MG/ML IJ SOLN
10.0000 mg | Freq: Once | INTRAMUSCULAR | Status: AC
  Administered 2018-04-22: 10 mg via INTRAVENOUS

## 2018-04-22 MED ORDER — PALONOSETRON HCL INJECTION 0.25 MG/5ML
INTRAVENOUS | Status: AC
  Filled 2018-04-22: qty 5

## 2018-04-22 MED ORDER — OXALIPLATIN CHEMO INJECTION 100 MG/20ML
45.0000 mg/m2 | Freq: Once | INTRAVENOUS | Status: AC
  Administered 2018-04-22: 100 mg via INTRAVENOUS
  Filled 2018-04-22: qty 20

## 2018-04-22 MED ORDER — LEUCOVORIN CALCIUM INJECTION 350 MG
400.0000 mg/m2 | Freq: Once | INTRAVENOUS | Status: AC
  Administered 2018-04-22: 896 mg via INTRAVENOUS
  Filled 2018-04-22: qty 44.8

## 2018-04-22 MED ORDER — DEXTROSE 5 % IV SOLN
Freq: Once | INTRAVENOUS | Status: AC
  Administered 2018-04-22: 10:00:00 via INTRAVENOUS
  Filled 2018-04-22: qty 250

## 2018-04-22 MED ORDER — SODIUM CHLORIDE 0.9 % IV SOLN
2400.0000 mg/m2 | INTRAVENOUS | Status: DC
  Administered 2018-04-22: 5400 mg via INTRAVENOUS
  Filled 2018-04-22: qty 108

## 2018-04-22 MED ORDER — FLUOROURACIL CHEMO INJECTION 2.5 GM/50ML
400.0000 mg/m2 | Freq: Once | INTRAVENOUS | Status: AC
  Administered 2018-04-22: 900 mg via INTRAVENOUS
  Filled 2018-04-22: qty 18

## 2018-04-22 MED ORDER — PALONOSETRON HCL INJECTION 0.25 MG/5ML
0.2500 mg | Freq: Once | INTRAVENOUS | Status: AC
  Administered 2018-04-22: 0.25 mg via INTRAVENOUS

## 2018-04-22 MED ORDER — DEXAMETHASONE SODIUM PHOSPHATE 10 MG/ML IJ SOLN
INTRAMUSCULAR | Status: AC
  Filled 2018-04-22: qty 1

## 2018-04-22 NOTE — Patient Instructions (Signed)
[facility] Discharge Instructions for Patients Receiving Chemotherapy  Today you received the following chemotherapy agents: Oxaliplatin, Leucovorin, and 5FU.  To help prevent nausea and vomiting after your treatment, we encourage you to take your nausea medication as directed.   If you develop nausea and vomiting that is not controlled by your nausea medication, call the clinic.   BELOW ARE SYMPTOMS THAT SHOULD BE REPORTED IMMEDIATELY:  *FEVER GREATER THAN 100.5 F  *CHILLS WITH OR WITHOUT FEVER  NAUSEA AND VOMITING THAT IS NOT CONTROLLED WITH YOUR NAUSEA MEDICATION  *UNUSUAL SHORTNESS OF BREATH  *UNUSUAL BRUISING OR BLEEDING  TENDERNESS IN MOUTH AND THROAT WITH OR WITHOUT PRESENCE OF ULCERS  *URINARY PROBLEMS  *BOWEL PROBLEMS  UNUSUAL RASH Items with * indicate a potential emergency and should be followed up as soon as possible.  Feel free to call the clinic should you have any questions or concerns. The clinic phone number is [phone].  Please show the CHEMO ALERT CARD at check-in to the Emergency Department and triage nurse.    

## 2018-04-22 NOTE — Progress Notes (Addendum)
[facility] OFFICE PROGRESS NOTE   Diagnosis: Esophagus cancer  INTERVAL HISTORY:   Ronald Johnson returns as scheduled.  He last received chemotherapy 03/23/2018.  He notes less nausea.  Wife indicates his blood pressure is better.  He is tolerating liquids and soft solids.  Overall good appetite.  He continues to have pain with swallowing.  He denies shortness of breath.  He is no longer utilizing supplemental oxygen.  He continues to have a cough.  He denies any bleeding.  Specifically no black stools.  Objective:  Vital signs in last 24 hours:  Blood pressure (!) 96/53, pulse 65, temperature 97.7 F (36.5 C), temperature source Oral, resp. rate 18, height 6\' 4"  (1.93 m), weight 183 lb 4.8 oz (83.1 kg), SpO2 98 %.    HEENT: No thrush or ulcers. Resp: Distant breath sounds. Cardio: Regular rate and rhythm. GI: Abdomen soft and nontender.  No hepatomegaly. Vascular: No leg edema. Port-A-Cath without erythema.  Lab Results:  Lab Results  Component Value Date   WBC 5.7 04/22/2018   HGB 8.5 (L) 04/22/2018   HCT 27.0 (L) 04/22/2018   MCV 105.5 (H) 04/22/2018   PLT 212 04/22/2018   NEUTROABS 4.2 04/22/2018    Imaging:  No results found.  Medications: I have reviewed the patient's current medications.  Assessment/Plan: 1. 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 EUS 03/16/2015 confirmed a clinical stage IIb (uT3,uN1) tumor ? Staging CTs of the chest, abdomen, and pelvis with no evidence of metastatic disease ? Initiation of radiation 04/19/2015, cycle 1 Taxol/carboplatin 04/20/2015: Radiation completed 05/29/2015; the fifth and final week of Taxol/carboplatin 05/18/2015 ? Restaging CTs 07/20/2015 revealed no evidence of metastatic disease, incidental left lower lobe pulmonary embolism ? Transhiatal total esophagectomy 07/25/2015 confirmed a ypT3, ypN2 with 4/5 positive lymph nodes, lymphovascular invasion, perineural  invasion, negative resection margins ? PD1 score-0 ? CT neck 07/25/2017-4-5 cm mass in the right lower neck upper mediastinum immediately adjacent to the esophageal anastomosis. 12 mm mass within the right parotid enlarged since the previous PET study where it was about half that size. Question few small pulmonary nodules. Borderline enlarged mediastinal lymph nodes. ? CTs brain/chest/abdomen/pelvis 08/06/2017-ill-defined mass at the right thoracic inlet extending into the right superior mediastinum; new bilateral but right much greater than left peripheral reticulonodular and groundglass opacities; scattered small paratracheal and anterior mediastinal lymph nodes; small bilateral axillary lymph nodes. Brain CT with no evidence of metastasis. ? 08/26/2017 biopsy right supraclavicular lymph node-very scant fragment of benign lymph node. ? PET scan 09/11/2017-poorly marginated heterogeneously hypermetabolic soft tissue mass at the right thoracic inlet extending laterally from the esophagogastric anastomosis in the neck. No definite hypermetabolic metastatic disease.Patchy tree-in-bud opacities throughout the lungs significantly decreased. Persistent scattered subcentimeter solid pulmonary nodules in both lungs stable to decreased. Stable irregular 1.7 cm nodular bandlike opacity anterior left lower lobe with associated low-level metabolism. Nonspecific mildly hypermetabolic subcarinal and bilateral hilar lymph nodes. Nonspecific mildly hypermetabolic nodular focus of soft tissue in the upper left retroperitoneum anterior to the adrenal gland. ? Upper endoscopy 09/15/2017-at 20 cm from the incisors tight stricture at the anastomosis measuring only a few millimeters in diameter. Surrounding tissues firm, slightly friable, noncompliant. Stricture was dilated with a 15 mm balloon several times. Diameter of stricture improved but still would not permit passage of a standard upper endoscope. Friable areas  biopsied-invasive adenocarcinoma. ? Initiation of radiation and concurrent capecitabine 2/4/2019completed 10/30/2017 ? CTs neck and chest 12/23/2017-stable abnormal soft tissue  at the right thoracic inlet with encasement of the proximal right common carotid artery, interval development of sclerotic lesions at C7 and T1, new indeterminate right lung opacities ? Cycle 1 FOLFOX 01/08/2018 ? Cycle 2 FOLFOX 02/02/2018 (Oxaliplatin dose reduced due to neutropenia) ? Cycle 3 FOLFOX 02/16/2018, white cell growth factor support added ? CT 02/24/2018-progressive loculated right effusion, stable lung nodules, stable thoracic inlet soft tissue, small peripheral pulmonary emboli ? Cycle 4 FOLFOX 03/09/2018 ? Cycle 5 FOLFOX 03/23/2018 ? Cycle 6 FOLFOX 04/22/2018  2. Postprandial subxiphoid pain secondary to #1  3. Diabetes-elevated blood sugar readings at the [facility] and at home  4. COPD  5. Depression-Improved with Wellbutrin  6. Hypertension  7. History of adenomatous colon polyps  8. History of diabetic related neuropathy, peripheral arterial disease, and nephropathy  9. History of nausea-likely related to radiation esophagitis/gastritis  10. Incidental pulmonary embolism noted on the CT 07/20/2015, treated with Xarelto; Xarelto placed on hold 04/09/2018 due to heme positive stool  11. Admission 08/18/2015 with GI bleeding while on Xarelto, anticoagulation therapy discontinued  12. Dysphagia-improved with esophageal dilatation procedures by Dr. Henrene Pastor, progressive dysphagia secondary to local recurrence of esophagus cancer with obstruction at the esophagus anastomosis  13. Pancytopenia secondary to chemotherapy  14. CT scan abdomen/pelvis 10/09/2016 (done to evaluate persistent nausea, epigastric pain)-no acute findings or explanation for the patient's symptoms. Resolving postsurgical changes from presumed previous esophagectomy and gastric pull-through. No evidence of  metastatic disease.  15. Admission 02/24/2018 with sepsis syndrome- probable pulmonary source for infection with a right effusion,cultures negative   16. Incidental pulmonary emboli noted on CT 02/24/2018, Xarelto started 03/02/2018  17.  Right pleural effusion, status post thoracentesis 03/20/2018-"suspicious" for adenocarcinoma  18.  Severe anemia-status post transfusion with packed red blood cells 03/30/2018, 03/31/2018, and 04/02/2018    Disposition: Mr. Tomei appears stable.  He has completed 5 cycles of FOLFOX.  Performance status is stable to improved.  Plan to proceed with cycle 6 today as scheduled.  We are referring him for a restaging CT scan of the chest after this cycle.  He will return for lab, follow-up and possible chemotherapy in 2 weeks.  He will contact the office in the interim with any problems.  Patient seen with Dr. [name].    Ned Card ANP/GNP-BC   04/22/2018  9:27 AM  This was a shared visit with Ned Card.  Mr. Reas appears stable.  He will complete another cycle of FOLFOX today.  His CEA is lower since beginning FOLFOX.  He will undergo a restaging CT after this cycle of chemotherapy.  Julieanne Manson, MD

## 2018-04-23 ENCOUNTER — Encounter: Payer: Self-pay | Admitting: Neurology

## 2018-04-23 ENCOUNTER — Ambulatory Visit (INDEPENDENT_AMBULATORY_CARE_PROVIDER_SITE_OTHER): Payer: Medicare Other | Admitting: Neurology

## 2018-04-23 ENCOUNTER — Encounter: Payer: Self-pay | Admitting: Occupational Therapy

## 2018-04-23 ENCOUNTER — Ambulatory Visit: Payer: Self-pay | Admitting: Physical Therapy

## 2018-04-23 VITALS — BP 114/64 | HR 86 | Ht 76.0 in | Wt 186.0 lb

## 2018-04-23 DIAGNOSIS — G902 Horner's syndrome: Secondary | ICD-10-CM | POA: Diagnosis not present

## 2018-04-23 DIAGNOSIS — C160 Malignant neoplasm of cardia: Secondary | ICD-10-CM

## 2018-04-23 DIAGNOSIS — D649 Anemia, unspecified: Secondary | ICD-10-CM | POA: Diagnosis not present

## 2018-04-23 NOTE — Patient Instructions (Signed)
Decrease topiramate to 50 mg twice per day for a week and then go to 50 mg once a day for a week and then stop the medication.  Call me if headaches return.

## 2018-04-24 ENCOUNTER — Inpatient Hospital Stay: Payer: Medicare Other

## 2018-04-24 VITALS — BP 118/58 | HR 6 | Temp 98.1°F | Resp 18

## 2018-04-24 DIAGNOSIS — D649 Anemia, unspecified: Secondary | ICD-10-CM | POA: Diagnosis not present

## 2018-04-24 DIAGNOSIS — R0600 Dyspnea, unspecified: Secondary | ICD-10-CM | POA: Diagnosis not present

## 2018-04-24 DIAGNOSIS — C159 Malignant neoplasm of esophagus, unspecified: Secondary | ICD-10-CM

## 2018-04-24 DIAGNOSIS — C160 Malignant neoplasm of cardia: Secondary | ICD-10-CM | POA: Diagnosis not present

## 2018-04-24 DIAGNOSIS — R05 Cough: Secondary | ICD-10-CM | POA: Diagnosis not present

## 2018-04-24 DIAGNOSIS — Z5111 Encounter for antineoplastic chemotherapy: Secondary | ICD-10-CM | POA: Diagnosis not present

## 2018-04-24 DIAGNOSIS — R918 Other nonspecific abnormal finding of lung field: Secondary | ICD-10-CM | POA: Diagnosis not present

## 2018-04-24 MED ORDER — PEGFILGRASTIM-CBQV 6 MG/0.6ML ~~LOC~~ SOSY
PREFILLED_SYRINGE | SUBCUTANEOUS | Status: AC
  Filled 2018-04-24: qty 0.6

## 2018-04-24 MED ORDER — PEGFILGRASTIM-CBQV 6 MG/0.6ML ~~LOC~~ SOSY
6.0000 mg | PREFILLED_SYRINGE | Freq: Once | SUBCUTANEOUS | Status: AC
  Administered 2018-04-24: 6 mg via SUBCUTANEOUS

## 2018-04-24 MED ORDER — SODIUM CHLORIDE 0.9% FLUSH
10.0000 mL | INTRAVENOUS | Status: DC | PRN
  Administered 2018-04-24: 10 mL
  Filled 2018-04-24: qty 10

## 2018-04-24 MED ORDER — HEPARIN SOD (PORK) LOCK FLUSH 100 UNIT/ML IV SOLN
500.0000 [IU] | Freq: Once | INTRAVENOUS | Status: AC | PRN
  Administered 2018-04-24: 500 [IU]
  Filled 2018-04-24: qty 5

## 2018-04-28 ENCOUNTER — Ambulatory Visit: Payer: Medicare Other | Admitting: Physical Therapy

## 2018-04-28 ENCOUNTER — Ambulatory Visit: Payer: Medicare Other | Admitting: Occupational Therapy

## 2018-04-30 ENCOUNTER — Ambulatory Visit: Payer: Self-pay | Admitting: Physical Therapy

## 2018-04-30 ENCOUNTER — Encounter: Payer: Self-pay | Admitting: Occupational Therapy

## 2018-05-03 ENCOUNTER — Other Ambulatory Visit: Payer: Self-pay | Admitting: Oncology

## 2018-05-04 NOTE — Progress Notes (Signed)
FMLA successfully faxed to [employer] at [phone]. Mailed copy to patient address on file. 

## 2018-05-05 ENCOUNTER — Encounter (HOSPITAL_COMMUNITY): Payer: Self-pay

## 2018-05-05 ENCOUNTER — Ambulatory Visit (HOSPITAL_COMMUNITY)
Admission: RE | Admit: 2018-05-05 | Discharge: 2018-05-05 | Disposition: A | Discharge status: Home / Self Care | Payer: Medicare Other | Source: Ambulatory Visit | Attending: Nurse Practitioner | Admitting: Nurse Practitioner

## 2018-05-05 DIAGNOSIS — R911 Solitary pulmonary nodule: Secondary | ICD-10-CM | POA: Insufficient documentation

## 2018-05-05 DIAGNOSIS — J90 Pleural effusion, not elsewhere classified: Secondary | ICD-10-CM | POA: Diagnosis not present

## 2018-05-05 DIAGNOSIS — C155 Malignant neoplasm of lower third of esophagus: Secondary | ICD-10-CM | POA: Diagnosis not present

## 2018-05-05 DIAGNOSIS — C159 Malignant neoplasm of esophagus, unspecified: Secondary | ICD-10-CM | POA: Diagnosis not present

## 2018-05-05 DIAGNOSIS — I700 Atherosclerosis of aorta: Secondary | ICD-10-CM | POA: Insufficient documentation

## 2018-05-05 DIAGNOSIS — C7951 Secondary malignant neoplasm of bone: Secondary | ICD-10-CM | POA: Insufficient documentation

## 2018-05-05 DIAGNOSIS — J439 Emphysema, unspecified: Secondary | ICD-10-CM | POA: Diagnosis not present

## 2018-05-05 MED ORDER — HEPARIN SOD (PORK) LOCK FLUSH 100 UNIT/ML IV SOLN
500.0000 [IU] | Freq: Once | INTRAVENOUS | Status: AC
  Administered 2018-05-05: 500 [IU] via INTRAVENOUS

## 2018-05-05 MED ORDER — IOHEXOL 300 MG/ML  SOLN
75.0000 mL | Freq: Once | INTRAMUSCULAR | Status: AC | PRN
  Administered 2018-05-05: 75 mL via INTRAVENOUS

## 2018-05-05 MED ORDER — HEPARIN SOD (PORK) LOCK FLUSH 100 UNIT/ML IV SOLN
INTRAVENOUS | Status: AC
  Administered 2018-05-05: 500 [IU] via INTRAVENOUS
  Filled 2018-05-05: qty 5

## 2018-05-07 ENCOUNTER — Encounter: Payer: Self-pay | Admitting: Nurse Practitioner

## 2018-05-07 ENCOUNTER — Telehealth: Payer: Self-pay

## 2018-05-07 ENCOUNTER — Inpatient Hospital Stay (HOSPITAL_BASED_OUTPATIENT_CLINIC_OR_DEPARTMENT_OTHER): Payer: Medicare Other | Admitting: Nurse Practitioner

## 2018-05-07 ENCOUNTER — Inpatient Hospital Stay: Payer: Medicare Other

## 2018-05-07 VITALS — BP 93/50 | HR 84 | Temp 97.7°F | Resp 18 | Ht 76.0 in | Wt 183.2 lb

## 2018-05-07 DIAGNOSIS — Z9221 Personal history of antineoplastic chemotherapy: Secondary | ICD-10-CM

## 2018-05-07 DIAGNOSIS — R918 Other nonspecific abnormal finding of lung field: Secondary | ICD-10-CM | POA: Diagnosis not present

## 2018-05-07 DIAGNOSIS — C160 Malignant neoplasm of cardia: Secondary | ICD-10-CM

## 2018-05-07 DIAGNOSIS — R05 Cough: Secondary | ICD-10-CM

## 2018-05-07 DIAGNOSIS — R0600 Dyspnea, unspecified: Secondary | ICD-10-CM | POA: Diagnosis not present

## 2018-05-07 DIAGNOSIS — D649 Anemia, unspecified: Secondary | ICD-10-CM

## 2018-05-07 DIAGNOSIS — Z95828 Presence of other vascular implants and grafts: Secondary | ICD-10-CM

## 2018-05-07 DIAGNOSIS — C155 Malignant neoplasm of lower third of esophagus: Secondary | ICD-10-CM

## 2018-05-07 DIAGNOSIS — Z923 Personal history of irradiation: Secondary | ICD-10-CM | POA: Diagnosis not present

## 2018-05-07 DIAGNOSIS — Z5111 Encounter for antineoplastic chemotherapy: Secondary | ICD-10-CM | POA: Diagnosis not present

## 2018-05-07 LAB — CBC WITH DIFFERENTIAL (CANCER CENTER ONLY)
Basophils Absolute: 0.1 10*3/uL (ref 0.0–0.1)
Basophils Relative: 1 %
Eosinophils Absolute: 0.2 10*3/uL (ref 0.0–0.5)
Eosinophils Relative: 4 %
HEMATOCRIT: 24.4 % — AB (ref 38.4–49.9)
HEMOGLOBIN: 8 g/dL — AB (ref 13.0–17.1)
LYMPHS ABS: 0.5 10*3/uL — AB (ref 0.9–3.3)
LYMPHS PCT: 8 %
MCH: 33.4 pg (ref 27.2–33.4)
MCHC: 32.7 g/dL (ref 32.0–36.0)
MCV: 102.2 fL — AB (ref 79.3–98.0)
MONOS PCT: 7 %
Monocytes Absolute: 0.4 10*3/uL (ref 0.1–0.9)
NEUTROS ABS: 5 10*3/uL (ref 1.5–6.5)
NEUTROS PCT: 80 %
Platelet Count: 161 10*3/uL (ref 140–400)
RBC: 2.39 MIL/uL — ABNORMAL LOW (ref 4.20–5.82)
RDW: 17.6 % — ABNORMAL HIGH (ref 11.0–14.6)
WBC Count: 6.2 10*3/uL (ref 4.0–10.3)

## 2018-05-07 LAB — CMP (CANCER CENTER ONLY)
ALK PHOS: 109 U/L (ref 38–126)
ALT: 6 U/L (ref 0–44)
ANION GAP: 9 (ref 5–15)
AST: 11 U/L — ABNORMAL LOW (ref 15–41)
Albumin: 2.3 g/dL — ABNORMAL LOW (ref 3.5–5.0)
BILIRUBIN TOTAL: 0.3 mg/dL (ref 0.3–1.2)
BUN: 17 mg/dL (ref 8–23)
CALCIUM: 9.3 mg/dL (ref 8.9–10.3)
CO2: 28 mmol/L (ref 22–32)
CREATININE: 0.88 mg/dL (ref 0.61–1.24)
Chloride: 104 mmol/L (ref 98–111)
GFR, Estimated: >60 mL/min (ref >60)
Glucose, Bld: 118 mg/dL — ABNORMAL HIGH (ref 70–99)
Potassium: 3.8 mmol/L (ref 3.5–5.1)
Sodium: 141 mmol/L (ref 135–145)
TOTAL PROTEIN: 6.7 g/dL (ref 6.5–8.1)

## 2018-05-07 LAB — PREPARE RBC (CROSSMATCH)

## 2018-05-07 MED ORDER — SODIUM CHLORIDE 0.9% FLUSH
3.0000 mL | INTRAVENOUS | Status: DC | PRN
  Filled 2018-05-07: qty 10

## 2018-05-07 MED ORDER — SODIUM CHLORIDE 0.9% FLUSH
10.0000 mL | INTRAVENOUS | Status: AC | PRN
  Administered 2018-05-07: 10 mL
  Filled 2018-05-07: qty 10

## 2018-05-07 MED ORDER — HEPARIN SOD (PORK) LOCK FLUSH 100 UNIT/ML IV SOLN
500.0000 [IU] | Freq: Every day | INTRAVENOUS | Status: AC | PRN
  Administered 2018-05-07: 500 [IU]
  Filled 2018-05-07: qty 5

## 2018-05-07 MED ORDER — SODIUM CHLORIDE 0.9% FLUSH
10.0000 mL | INTRAVENOUS | Status: DC | PRN
  Filled 2018-05-07: qty 10

## 2018-05-07 MED ORDER — SODIUM CHLORIDE 0.9% IV SOLUTION
250.0000 mL | Freq: Once | INTRAVENOUS | Status: DC
  Filled 2018-05-07: qty 250

## 2018-05-07 MED ORDER — LEVOFLOXACIN 500 MG PO TABS: 500.0000 mg | ORAL_TABLET | Freq: Every day | ORAL | 0 refills | Status: AC

## 2018-05-07 NOTE — Progress Notes (Addendum)
[facility] OFFICE PROGRESS NOTE   Diagnosis:  Esophagus cancer  INTERVAL HISTORY:   Ronald Johnson returns as scheduled.  He completed cycle 6 FOLFOX 04/22/2018.  He had nausea beginning day 2 and lasting for 1 week.  No mouth sores.  He tends to have diarrhea for 2 to 3 days after chemotherapy.  He notes good relief with Imodium.  He denies cold sensitivity.  No numbness or tingling in his hands or feet.  He notes increased shortness of breath and a worse cough over the past week.  No fever.  He is on oxygen at 2 L.  No leg swelling or calf pain.  Wife notes his energy level is poor and that he is weak.  Objective:  Vital signs in last 24 hours:  Blood pressure (!) 93/50, pulse 84, temperature 97.7 F (36.5 C), temperature source Oral, resp. rate 18, height 6\' 4"  (1.93 m), weight 183 lb 3.2 oz (83.1 kg), SpO2 94 %, peak flow (!) 2 L/min.    HEENT: No thrush or ulcers. Resp: Distant breath sounds.  Scattered rhonchi.  No respiratory distress. Cardio: Regular rate and rhythm. GI: Abdomen soft and nontender.  No hepatosplenomegaly. Vascular: No leg edema. Port-A-Cath without erythema.   Lab Results:  Lab Results  Component Value Date   WBC 6.2 05/07/2018   HGB 8.0 (L) 05/07/2018   HCT 24.4 (L) 05/07/2018   MCV 102.2 (H) 05/07/2018   PLT 161 05/07/2018   NEUTROABS 5.0 05/07/2018    Imaging:  Ct Chest W Contrast  Result Date: 05/06/2018 CLINICAL DATA:  Esophageal cancer, status post gastrectomy, chemotherapy and XRT complete EXAM: CT CHEST WITH CONTRAST TECHNIQUE: Multidetector CT imaging of the chest was performed during intravenous contrast administration. CONTRAST:  61mL OMNIPAQUE IOHEXOL 300 MG/ML  SOLN COMPARISON:  CTA chest dated 02/24/2018 FINDINGS: Cardiovascular: Heart is normal in size.  No pericardial effusion. No evidence of thoracic aortic aneurysm. Atherosclerotic calcifications of the aortic arch. Three vessel coronary atherosclerosis. Left chest port  terminates at the cavoatrial junction. Mediastinum/Nodes: Infiltrating soft tissue mass at the right thoracic inlet, measuring approximately 5.4 x 4.3 cm, encasing the right common carotid artery and similar to priors. This remains suspicious for malignancy. Previously, this measured 4.9 x 4.9 cm, and is considered grossly unchanged. Small mediastinal lymph nodes, including a dominant 11 mm short axis subcarinal node (series 2/image 98), previously 9 mm. Status post esophagectomy with gastric pull-through. Visualized thyroid is unremarkable. Lungs/Pleura: Narrowing of the right middle lobe bronchus with associated central opacity (series 7/image 93) with additional interlobular septal thickening and peribronchovascular nodularity throughout the right middle lobe (series 7/image 121), likely post obstructive. This appearance may reflect lymphangitic spread of tumor (favored) versus infection. Additional 17 mm nodule in the right lung apex (series 7/image 42) and mild patchy/subpleural nodularity in the right lower lobe. Dominant 8 mm nodule (series 7/image 102), unchanged. Left lung is clear. Mild centrilobular and paraseptal emphysematous changes, upper lobe predominant. Moderate right pleural effusion, mildly loculated, grossly unchanged. No pneumothorax. Upper Abdomen: Visualized upper abdomen is notable for mild nodular thickening of the left adrenal gland, unchanged. Bilateral renal cysts. Vascular calcifications. Musculoskeletal: Degenerative changes of the visualized thoracolumbar spine. Dominant sclerotic lesion along the posterior aspect of T1 (sagittal image 99), unchanged. IMPRESSION: Post esophagectomy with gastric pull-through. Stable amorphous soft tissue mass at the right thoracic inlet, suspicious for malignancy. Narrowing of the right middle lobe bronchus with new associated central opacity, worrisome for tumor, less likely infection.  Associated peribronchovascular nodularity throughout the right  middle lobe, favoring lymphangitic spread of tumor, less likely infection. Associated 17 mm nodule in the right lung apex and associated mild subpleural nodularity in the right lower lobe. Moderate right pleural effusion, mildly loculated, grossly unchanged. Stable sclerotic metastasis along the posterior aspect of T1. Aortic Atherosclerosis (ICD10-I70.0) and Emphysema (ICD10-J43.9). Electronically Signed   By: Julian Hy M.D.   On: 05/06/2018 09:27    Medications: I have reviewed the patient's current medications.  Assessment/Plan: 1. 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 EUS 03/16/2015 confirmed a clinical stage IIb (uT3,uN1) tumor ? Staging CTs of the chest, abdomen, and pelvis with no evidence of metastatic disease ? Initiation of radiation 04/19/2015, cycle 1 Taxol/carboplatin 04/20/2015: Radiation completed 05/29/2015; the fifth and final week of Taxol/carboplatin 05/18/2015 ? Restaging CTs 07/20/2015 revealed no evidence of metastatic disease, incidental left lower lobe pulmonary embolism ? Transhiatal total esophagectomy 07/25/2015 confirmed a ypT3, ypN2 with 4/5 positive lymph nodes, lymphovascular invasion, perineural invasion, negative resection margins ? PD1 score-0 ? CT neck 07/25/2017-4-5 cm mass in the right lower neck upper mediastinum immediately adjacent to the esophageal anastomosis. 12 mm mass within the right parotid enlarged since the previous PET study where it was about half that size. Question few small pulmonary nodules. Borderline enlarged mediastinal lymph nodes. ? CTs brain/chest/abdomen/pelvis 08/06/2017-ill-defined mass at the right thoracic inlet extending into the right superior mediastinum; new bilateral but right much greater than left peripheral reticulonodular and groundglass opacities; scattered small paratracheal and anterior mediastinal lymph nodes; small bilateral axillary lymph nodes. Brain CT with no  evidence of metastasis. ? 08/26/2017 biopsy right supraclavicular lymph node-very scant fragment of benign lymph node. ? PET scan 09/11/2017-poorly marginated heterogeneously hypermetabolic soft tissue mass at the right thoracic inlet extending laterally from the esophagogastric anastomosis in the neck. No definite hypermetabolic metastatic disease.Patchy tree-in-bud opacities throughout the lungs significantly decreased. Persistent scattered subcentimeter solid pulmonary nodules in both lungs stable to decreased. Stable irregular 1.7 cm nodular bandlike opacity anterior left lower lobe with associated low-level metabolism. Nonspecific mildly hypermetabolic subcarinal and bilateral hilar lymph nodes. Nonspecific mildly hypermetabolic nodular focus of soft tissue in the upper left retroperitoneum anterior to the adrenal gland. ? Upper endoscopy 09/15/2017-at 20 cm from the incisors tight stricture at the anastomosis measuring only a few millimeters in diameter. Surrounding tissues firm, slightly friable, noncompliant. Stricture was dilated with a 15 mm balloon several times. Diameter of stricture improved but still would not permit passage of a standard upper endoscope. Friable areas biopsied-invasive adenocarcinoma. ? Initiation of radiation and concurrent capecitabine 2/4/2019completed 10/30/2017 ? CTs neck and chest 12/23/2017-stable abnormal soft tissue at the right thoracic inlet with encasement of the proximal right common carotid artery, interval development of sclerotic lesions at C7 and T1, new indeterminate right lung opacities ? Cycle 1 FOLFOX 01/08/2018 ? Cycle 2 FOLFOX 02/02/2018 (Oxaliplatin dose reduced due to neutropenia) ? Cycle 3 FOLFOX 02/16/2018, white cell growth factor support added ? CT 02/24/2018-progressive loculated right effusion, stable lung nodules, stable thoracic inlet soft tissue, small peripheral pulmonary emboli ? Cycle 4 FOLFOX 03/09/2018 ? Cycle 5 FOLFOX  03/23/2018 ? Cycle 6 FOLFOX 04/22/2018 ? Chest CT 05/05/2018-stable amorphous soft tissue mass right thoracic inlet.  Narrowing of the right middle lobe bronchus with new associated central opacity.  Associated peribronchovascular nodularity throughout the right middle lobe.  17 mm nodule in the right lung apex and associated mild subpleural nodularity in the right lower lobe.  Moderate  right pleural effusion, mildly loculated, unchanged.  Stable sclerotic metastasis along the posterior aspect of T1.  2. Postprandial subxiphoid pain secondary to #1  3. Diabetes-elevated blood sugar readings at the [facility] and at home  4. COPD  5. Depression-Improved with Wellbutrin  6. Hypertension  7. History of adenomatous colon polyps  8. History of diabetic related neuropathy, peripheral arterial disease, and nephropathy  9. History of nausea-likely related to radiation esophagitis/gastritis  10. Incidental pulmonary embolism noted on the CT 07/20/2015, treated with Xarelto; Xarelto placed on hold 04/09/2018 due to heme positive stool  11. Admission 08/18/2015 with GI bleeding while on Xarelto, anticoagulation therapy discontinued  12. Dysphagia-improved with esophageal dilatation procedures by Dr. Henrene Pastor, progressive dysphagia secondary to local recurrence of esophagus cancer with obstruction at the esophagus anastomosis  13. Pancytopenia secondary to chemotherapy  14. CT scan abdomen/pelvis 10/09/2016 (done to evaluate persistent nausea, epigastric pain)-no acute findings or explanation for the patient's symptoms. Resolving postsurgical changes from presumed previous esophagectomy and gastric pull-through. No evidence of metastatic disease.  15. Admission 02/24/2018 with sepsis syndrome- probable pulmonary source for infection with a right effusion,cultures negative   16. Incidental pulmonary emboli noted on CT 02/24/2018, Xarelto started 03/02/2018  17.Right  pleural effusion, status post thoracentesis 03/20/2018-"suspicious" for adenocarcinoma  18.Severe anemia-status post transfusion with packed red blood cells 03/30/2018, 03/31/2018, and 04/02/2018   Disposition: Ronald Johnson appears unchanged.  He has completed 6 cycles of FOLFOX.  The recent restaging chest CT is overall stable with question of lymphatic tumor spread versus pneumonia in the right middle lobe.  CEA continues to be improved.  His performance status has not improved, continues to be poor.  We will hold today's chemotherapy.  He will complete a course of Levaquin.  He has progressive anemia and will receive 2 units of blood today.  If he does not note improvement in the dyspnea/cough following the above we will refer him for a therapeutic/diagnostic right thoracentesis.  He will return as scheduled on 05/20/2018.  He will contact the office in the interim as outlined above or with any other problems.  Patient seen with Dr. [name].  CT images reviewed on the computer with Ronald Johnson and his wife.  25 minutes were spent face-to-face at today's visit with the majority of that time involved in counseling/coordination of care.    Ned Card ANP/GNP-BC   05/07/2018  9:20 AM  This was a shared visit with Ned Card.  We reviewed the CT images with Ronald Johnson and his wife.  He has an increased cough and dyspnea.  There are progressive nodular infiltrates in the right lung.  Is unclear whether this is related to infection or tumor progression.  Chemotherapy will be placed on hold.  He will complete a course of antibiotics.  He will return for an office visit in 2 weeks.  We will consider a therapeutic thoracentesis if the dyspnea has not improved in 2 weeks.  Julieanne Manson, MD

## 2018-05-07 NOTE — Telephone Encounter (Signed)
Printed avs and calender of upcoming appointment. Per 9/19 los 

## 2018-05-07 NOTE — Patient Instructions (Signed)
Implanted Port Home Guide An implanted port is a type of central line that is placed under the skin. Central lines are used to provide IV access when treatment or nutrition needs to be given through a person's veins. Implanted ports are used for long-term IV access. An implanted port may be placed because:  You need IV medicine that would be irritating to the small veins in your hands or arms.  You need long-term IV medicines, such as antibiotics.  You need IV nutrition for a long period.  You need frequent blood draws for lab tests.  You need dialysis.  Implanted ports are usually placed in the chest area, but they can also be placed in the upper arm, the abdomen, or the leg. An implanted port has two main parts:  Reservoir. The reservoir is round and will appear as a small, raised area under your skin. The reservoir is the part where a needle is inserted to give medicines or draw blood.  Catheter. The catheter is a thin, flexible tube that extends from the reservoir. The catheter is placed into a large vein. Medicine that is inserted into the reservoir goes into the catheter and then into the vein.  How will I care for my incision site? Do not get the incision site wet. Bathe or shower as directed by your health care provider. How is my port accessed? Special steps must be taken to access the port:  Before the port is accessed, a numbing cream can be placed on the skin. This helps numb the skin over the port site.  Your health care provider uses a sterile technique to access the port. ? Your health care provider must put on a mask and sterile gloves. ? The skin over your port is cleaned carefully with an antiseptic and allowed to dry. ? The port is gently pinched between sterile gloves, and a needle is inserted into the port.  Only "non-coring" port needles should be used to access the port. Once the port is accessed, a blood return should be checked. This helps ensure that the port  is in the vein and is not clogged.  If your port needs to remain accessed for a constant infusion, a clear (transparent) bandage will be placed over the needle site. The bandage and needle will need to be changed every week, or as directed by your health care provider.  Keep the bandage covering the needle clean and dry. Do not get it wet. Follow your health care provider's instructions on how to take a shower or bath while the port is accessed.  If your port does not need to stay accessed, no bandage is needed over the port.  What is flushing? Flushing helps keep the port from getting clogged. Follow your health care provider's instructions on how and when to flush the port. Ports are usually flushed with saline solution or a medicine called heparin. The need for flushing will depend on how the port is used.  If the port is used for intermittent medicines or blood draws, the port will need to be flushed: ? After medicines have been given. ? After blood has been drawn. ? As part of routine maintenance.  If a constant infusion is running, the port may not need to be flushed.  How long will my port stay implanted? The port can stay in for as long as your health care provider thinks it is needed. When it is time for the port to come out, surgery will be   done to remove it. The procedure is similar to the one performed when the port was put in. When should I seek immediate medical care? When you have an implanted port, you should seek immediate medical care if:  You notice a bad smell coming from the incision site.  You have swelling, redness, or drainage at the incision site.  You have more swelling or pain at the port site or the surrounding area.  You have a fever that is not controlled with medicine.  This information is not intended to replace advice given to you by your health care provider. Make sure you discuss any questions you have with your health care provider. Document  Released: 08/05/2005 Document Revised: 01/11/2016 Document Reviewed: 04/12/2013 Elsevier Interactive Patient Education  2017 [employer]  

## 2018-05-09 ENCOUNTER — Inpatient Hospital Stay: Payer: Medicare Other

## 2018-05-09 DIAGNOSIS — R05 Cough: Secondary | ICD-10-CM | POA: Diagnosis not present

## 2018-05-09 DIAGNOSIS — C160 Malignant neoplasm of cardia: Secondary | ICD-10-CM | POA: Diagnosis not present

## 2018-05-09 DIAGNOSIS — D649 Anemia, unspecified: Secondary | ICD-10-CM

## 2018-05-09 DIAGNOSIS — Z5111 Encounter for antineoplastic chemotherapy: Secondary | ICD-10-CM | POA: Diagnosis not present

## 2018-05-09 DIAGNOSIS — R0600 Dyspnea, unspecified: Secondary | ICD-10-CM | POA: Diagnosis not present

## 2018-05-09 DIAGNOSIS — R918 Other nonspecific abnormal finding of lung field: Secondary | ICD-10-CM | POA: Diagnosis not present

## 2018-05-09 LAB — PREPARE RBC (CROSSMATCH)

## 2018-05-09 MED ORDER — SODIUM CHLORIDE 0.9% IV SOLUTION
250.0000 mL | Freq: Once | INTRAVENOUS | Status: AC
  Administered 2018-05-09: 250 mL via INTRAVENOUS
  Filled 2018-05-09: qty 250

## 2018-05-09 MED ORDER — SODIUM CHLORIDE 0.9% FLUSH
10.0000 mL | INTRAVENOUS | Status: AC | PRN
  Administered 2018-05-09: 10 mL
  Filled 2018-05-09: qty 10

## 2018-05-09 MED ORDER — HEPARIN SOD (PORK) LOCK FLUSH 100 UNIT/ML IV SOLN
500.0000 [IU] | Freq: Every day | INTRAVENOUS | Status: AC | PRN
  Administered 2018-05-09: 500 [IU]
  Filled 2018-05-09: qty 5

## 2018-05-09 NOTE — Patient Instructions (Signed)

## 2018-05-10 LAB — TYPE AND SCREEN
ABO/RH(D): A POS
ANTIBODY SCREEN: POSITIVE
DONOR AG TYPE: NEGATIVE
DONOR AG TYPE: NEGATIVE
PT AG Type: NEGATIVE
UNIT DIVISION: 0
Unit division: 0

## 2018-05-10 LAB — BPAM RBC
BLOOD PRODUCT EXPIRATION DATE: 201910192359
Blood Product Expiration Date: 201910132359
ISSUE DATE / TIME: 201909211136
ISSUE DATE / TIME: 201909210932
UNIT TYPE AND RH: 6200
UNIT TYPE AND RH: 6200

## 2018-05-15 DIAGNOSIS — R0689 Other abnormalities of breathing: Secondary | ICD-10-CM | POA: Diagnosis not present

## 2018-05-15 DIAGNOSIS — R4020 Unspecified coma: Secondary | ICD-10-CM | POA: Diagnosis not present

## 2018-05-15 DIAGNOSIS — E1165 Type 2 diabetes mellitus with hyperglycemia: Secondary | ICD-10-CM | POA: Diagnosis not present

## 2018-05-15 DIAGNOSIS — R404 Transient alteration of awareness: Secondary | ICD-10-CM | POA: Diagnosis not present

## 2018-05-15 DIAGNOSIS — I499 Cardiac arrhythmia, unspecified: Secondary | ICD-10-CM | POA: Diagnosis not present

## 2018-05-18 ENCOUNTER — Ambulatory Visit: Payer: Self-pay | Admitting: Pulmonary Disease

## 2018-05-18 ENCOUNTER — Telehealth: Payer: Self-pay | Admitting: Endocrinology

## 2018-05-19 ENCOUNTER — Telehealth: Payer: Self-pay | Admitting: *Deleted

## 2018-05-19 ENCOUNTER — Other Ambulatory Visit: Payer: Self-pay | Admitting: Oncology

## 2018-05-19 ENCOUNTER — Ambulatory Visit: Payer: Self-pay | Admitting: Endocrinology

## 2018-05-19 DIAGNOSIS — Z0289 Encounter for other administrative examinations: Secondary | ICD-10-CM

## 2018-05-19 NOTE — Telephone Encounter (Signed)
Received call from patient's wife to cancel future appointments. Patient passed away 02-Jun-2018 at home. MD advised. Appointments cancelled.

## 2018-05-19 NOTE — Telephone Encounter (Signed)
Placed on [employer] for [employer]

## 2018-05-19 NOTE — Telephone Encounter (Signed)
There will be a death cert. In Dr. Loanne Drilling box for him to sign. Fax document # [phone]

## 2018-05-19 NOTE — Telephone Encounter (Signed)
Paperwork signed and faxed.

## 2018-05-19 DEATH — deceased

## 2018-05-20 ENCOUNTER — Inpatient Hospital Stay: Payer: Medicare Other

## 2018-05-20 ENCOUNTER — Inpatient Hospital Stay: Payer: Medicare Other | Admitting: Nurse Practitioner

## 2018-05-20 ENCOUNTER — Inpatient Hospital Stay: Payer: Medicare Other | Admitting: Nutrition

## 2020-03-27 IMAGING — CT CT ANGIO CHEST
2 of 6 series · 18 of 36 positions shown · IV contrast (iopamidol)
Comparison: Radiograph of same day.  CT scan December 23, 2017.

CLINICAL DATA: Shortness of breath. History of esophageal and
thyroid cancer.

EXAM:
CT ANGIOGRAPHY CHEST WITH CONTRAST
TECHNIQUE: Multidetector CT imaging of the chest was performed using the
standard protocol during bolus administration of intravenous
contrast. Multiplanar CT image reconstructions and MIPs were
obtained to evaluate the vascular anatomy.
CONTRAST:  100mL [id number] IOPAMIDOL ([id number]) INJECTION 76%

[Series 5: thins · axial · 0.77mm/px · z∈[-349,-57]mm · 17 of 326 slices shown]
[im 17/326  lung]
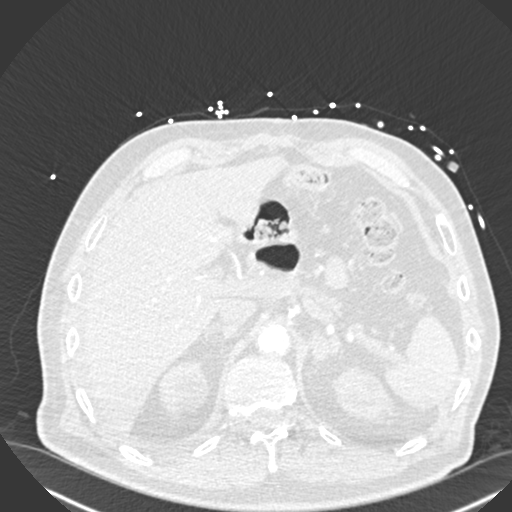
[im 33/326  mediastinal]
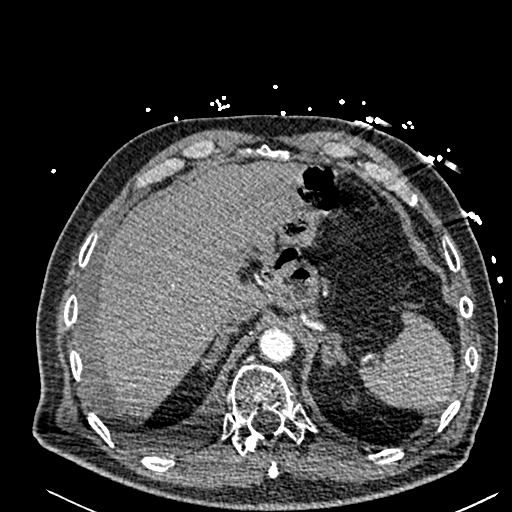
[im 49/326  lung]
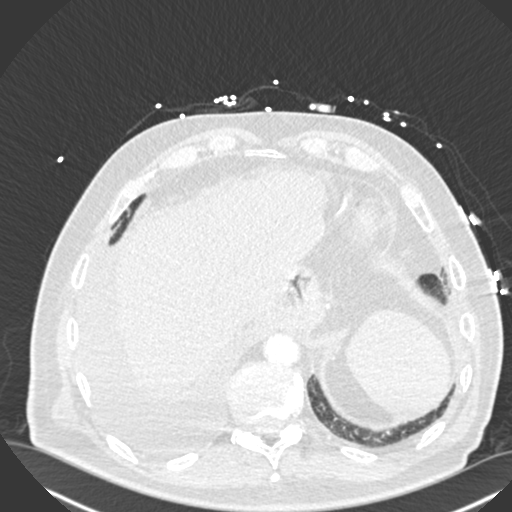
[im 66/326  mediastinal]
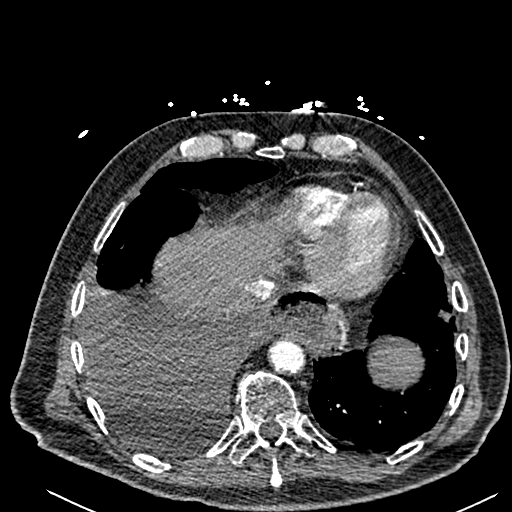
[im 98/326  lung]
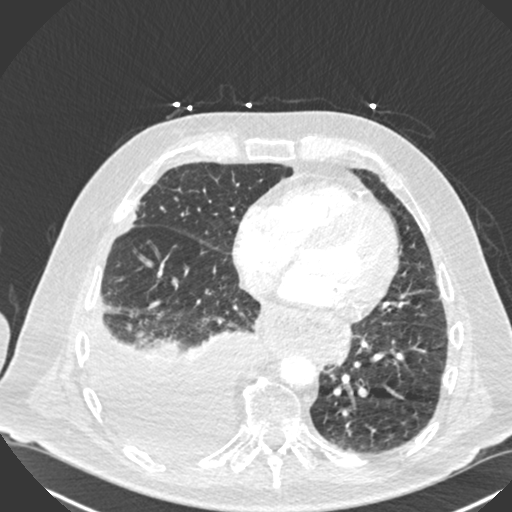
[im 114/326  mediastinal]
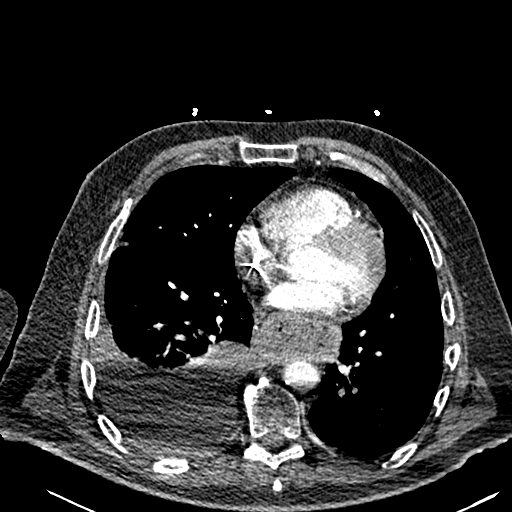
[im 131/326  lung]
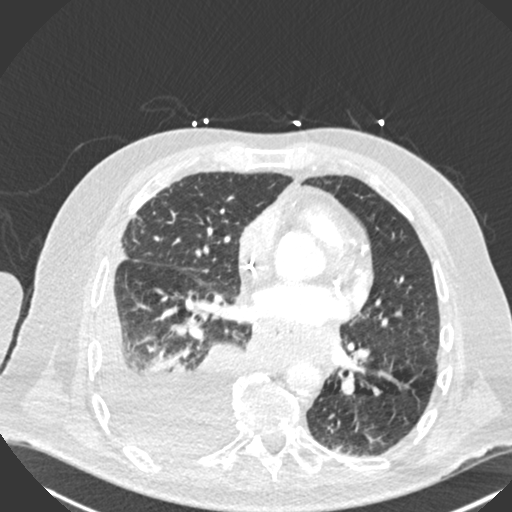
[im 147/326  mediastinal]
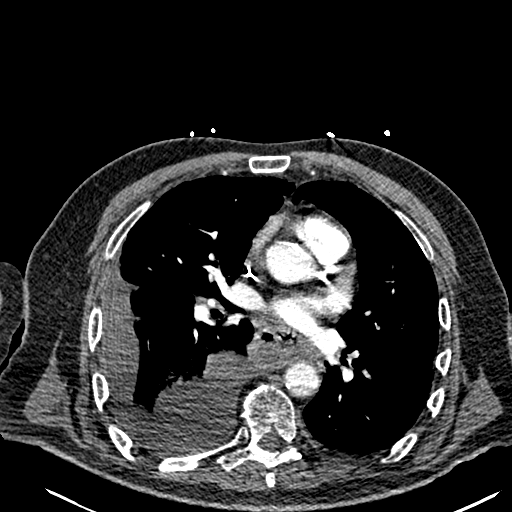
[im 163/326  lung]
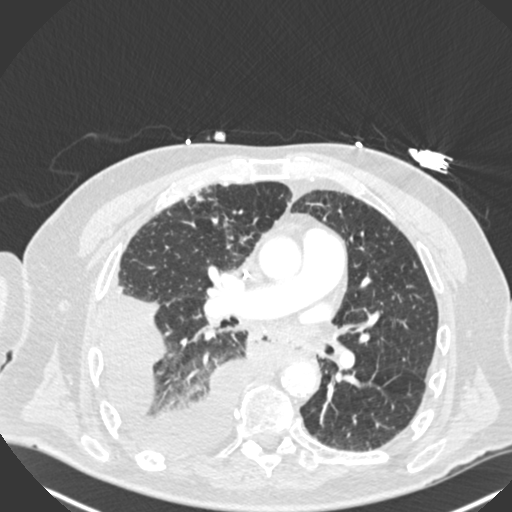
[im 179/326  mediastinal]
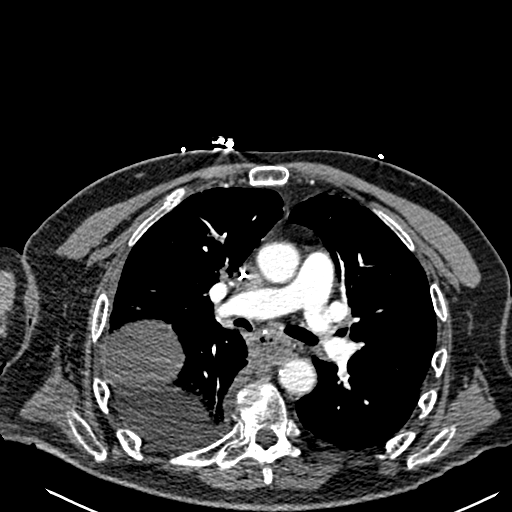
[im 196/326  lung]
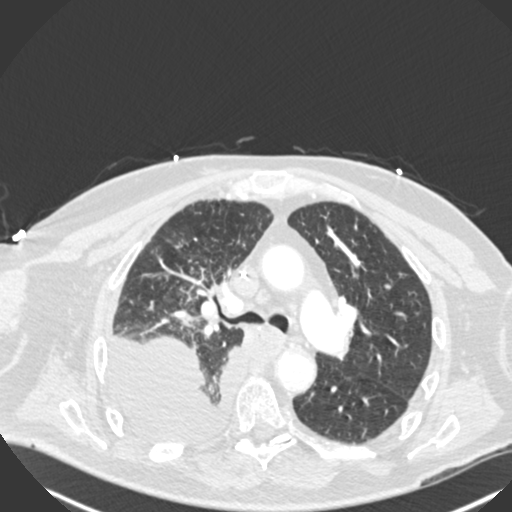
[im 212/326  mediastinal]
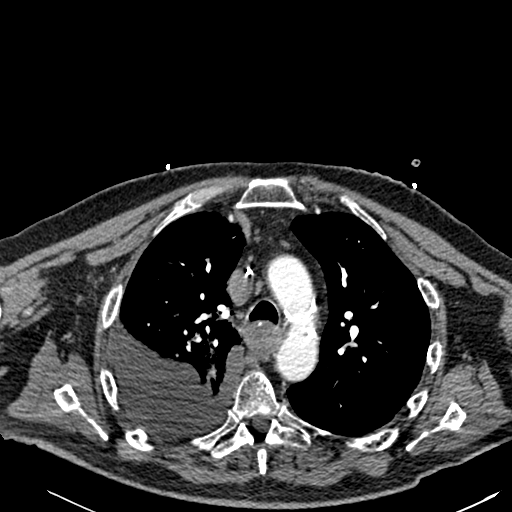
[im 228/326  lung]
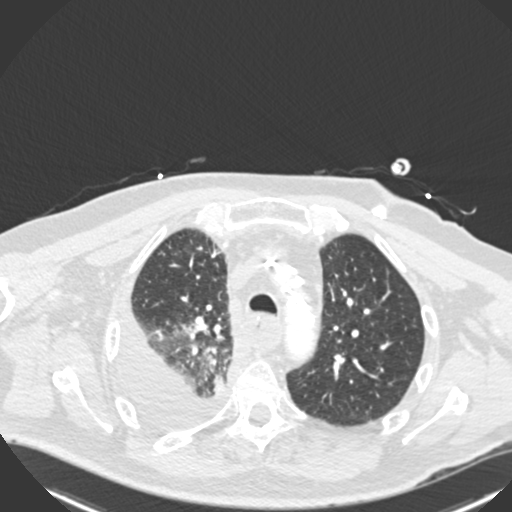
[im 261/326  mediastinal]
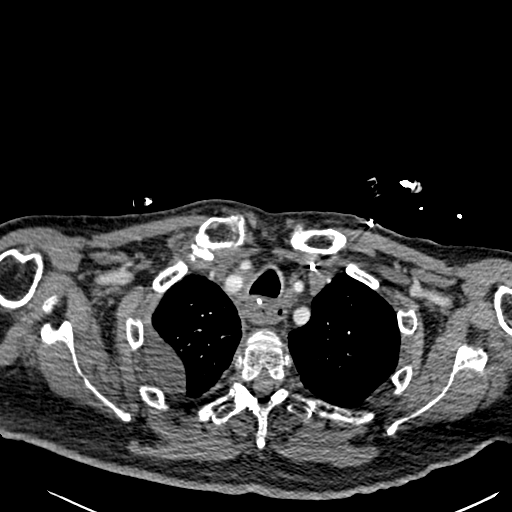
[im 277/326  lung]
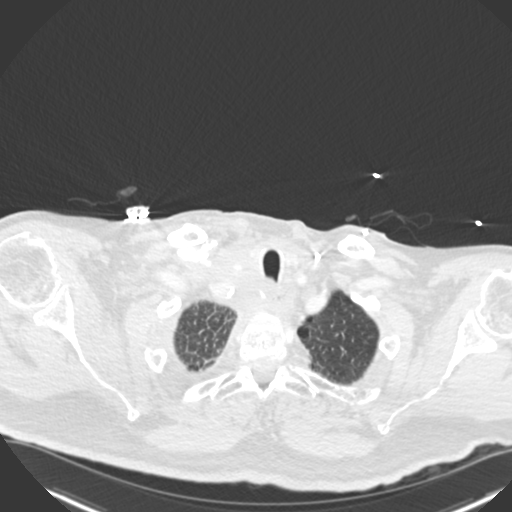
[im 293/326  mediastinal]
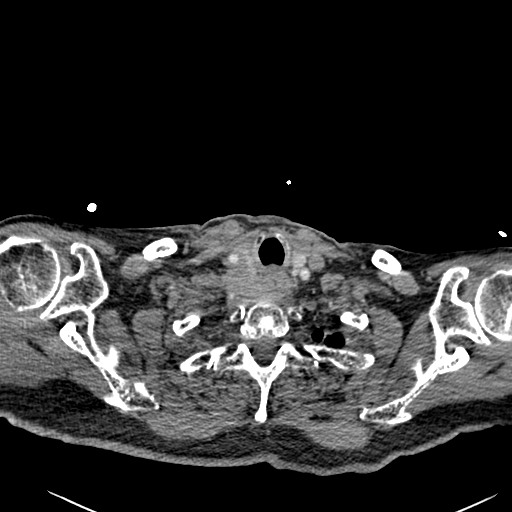
[im 309/326  lung]
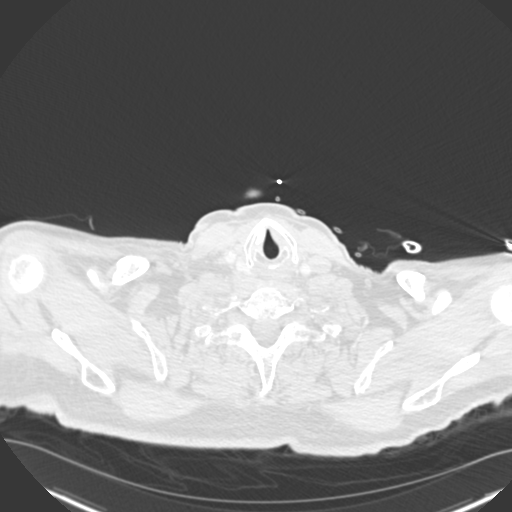

[Series 7: coronal mpr · coronal · 0.67mm/px · 1 of 151 slices shown]
[im 76/151  mediastinal]
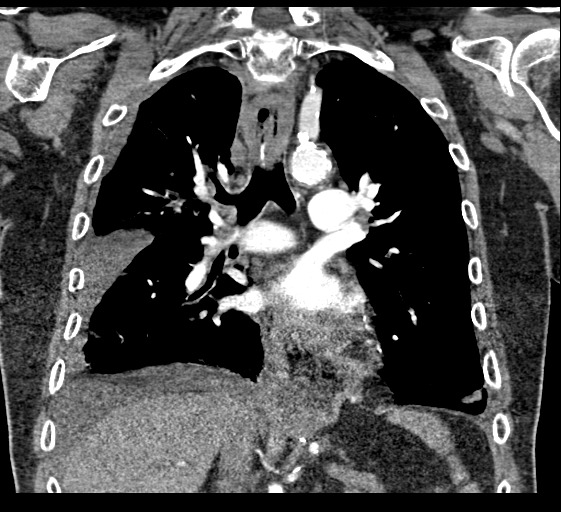

[18 of 36 positions shown; findings below may reference images not displayed]

FINDINGS: Cardiovascular: Small filling defects seen into lower lobe branches
of the left pulmonary artery concerning for small peripheral
pulmonary emboli. Atherosclerosis of thoracic aorta is noted without
aneurysm or dissection. Left-sided Port-A-Cath is again noted.
Probable coronary artery calcifications are noted. No pericardial
effusion is noted. Normal cardiac size.

Mediastinum/Nodes: Status post gastric pull-through procedure for
treatment of esophageal cancer. Continued presence of abnormal soft
tissue density in the right thoracic inlet which is grossly
unchanged in size or appearance compared to prior exam. This
surrounds the proximal right subclavian and carotid arteries. This
mass also extends to the right thyroid lobe. No definite adenopathy
is noted.

Lungs/Pleura: Interval development of large loculated right pleural
effusion is noted. Adjacent subsegmental atelectasis is noted in the
right lower lobe. 9 mm nodule is noted in right lower lobe which was
present on prior exam. No pneumothorax is noted. Bulla formation is
noted in both lung apices. Stable 17 x 8 mm nodular density is noted
laterally in left lower lobe.

Upper Abdomen: No acute abnormality.

Musculoskeletal: Stable sclerotic lesion is seen in T1 vertebral
body consistent with metastatic disease. No other significant
osseous abnormality is noted.

Review of the MIP images confirms the above findings.
IMPRESSION: Small filling defects are seen in 2 lower lobe branches of the left
pulmonary artery concerning for small peripheral pulmonary emboli.

Status post gastric pull-through procedure for treatment of
esophageal cancer. Stable abnormal soft tissue density seen in right
thoracic inlet concerning for malignancy.

Interval development of large loculated right pleural effusion with
adjacent subsegmental atelectasis of right lower lobe.

Stable bilateral pulmonary nodules are noted as described above.

Stable sclerotic lesion seen in T1 vertebral body most consistent
with metastatic disease.

Aortic Atherosclerosis ([id number]).
